# Patient Record
Sex: Male | Born: 1945 | Race: White | Hispanic: No | Marital: Married | Smoking: Never smoker
Health system: Southern US, Community
[De-identification: ages and names within clinical notes are randomized; demographics above are authoritative.]

## PROBLEM LIST (undated history)

## (undated) DIAGNOSIS — L570 Actinic keratosis: Secondary | ICD-10-CM

## (undated) DIAGNOSIS — E782 Mixed hyperlipidemia: Secondary | ICD-10-CM

## (undated) DIAGNOSIS — I209 Angina pectoris, unspecified: Secondary | ICD-10-CM

## (undated) DIAGNOSIS — D62 Acute posthemorrhagic anemia: Secondary | ICD-10-CM

## (undated) DIAGNOSIS — I10 Essential (primary) hypertension: Secondary | ICD-10-CM

## (undated) DIAGNOSIS — R1900 Intra-abdominal and pelvic swelling, mass and lump, unspecified site: Secondary | ICD-10-CM

## (undated) DIAGNOSIS — C480 Malignant neoplasm of retroperitoneum: Secondary | ICD-10-CM

## (undated) DIAGNOSIS — K56609 Unspecified intestinal obstruction, unspecified as to partial versus complete obstruction: Secondary | ICD-10-CM

## (undated) DIAGNOSIS — D126 Benign neoplasm of colon, unspecified: Secondary | ICD-10-CM

## (undated) DIAGNOSIS — R188 Other ascites: Secondary | ICD-10-CM

## (undated) DIAGNOSIS — R7301 Impaired fasting glucose: Secondary | ICD-10-CM

## (undated) HISTORY — PX: OTHER SURGICAL HISTORY: SHX169

---
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
## 1968-11-29 HISTORY — PX: KNEE ARTHROSCOPY: SUR90

## 1999-12-20 ENCOUNTER — Encounter: Payer: Self-pay | Admitting: Family Medicine

## 1999-12-20 ENCOUNTER — Ambulatory Visit (HOSPITAL_COMMUNITY): Admission: RE | Admit: 1999-12-20 | Discharge: 1999-12-20 | Payer: Self-pay | Admitting: Family Medicine

## 2002-04-13 ENCOUNTER — Encounter (INDEPENDENT_AMBULATORY_CARE_PROVIDER_SITE_OTHER): Payer: Self-pay | Admitting: *Deleted

## 2002-04-13 ENCOUNTER — Ambulatory Visit (HOSPITAL_COMMUNITY): Admission: RE | Admit: 2002-04-13 | Discharge: 2002-04-13 | Payer: Self-pay | Admitting: *Deleted

## 2010-03-13 ENCOUNTER — Ambulatory Visit
Admission: RE | Admit: 2010-03-13 | Discharge: 2010-03-13 | Payer: Self-pay | Source: Home / Self Care | Attending: Orthopedic Surgery | Admitting: Orthopedic Surgery

## 2010-03-29 ENCOUNTER — Encounter
Admission: RE | Admit: 2010-03-29 | Discharge: 2010-03-29 | Payer: Self-pay | Source: Home / Self Care | Attending: Family Medicine | Admitting: Family Medicine

## 2010-05-20 NOTE — Op Note (Signed)
  NAME:  Darryl Griffith, Darryl Griffith               ACCOUNT NO.:  [id number]  MEDICAL RECORD NO.:  [id number]          PATIENT TYPE:  AMB  LOCATION:  DSC                          FACILITY:  [facility]  PHYSICIAN:  Cindee Salt, M.D.       DATE OF BIRTH:  1945-04-03  DATE OF PROCEDURE:  03/13/2010 DATE OF DISCHARGE:                              OPERATIVE REPORT   PREOPERATIVE DIAGNOSIS:  Stenosing tenosynovitis, left middle finger.  POSTOPERATIVE DIAGNOSIS:  Stenosing tenosynovitis, left middle finger.  OPERATION:  Release of A1 pulley, left middle finger.  SURGEON:  Cindee Salt, MD  ASSISTANT:  Joaquin Courts, RN  ANESTHESIA:  Forearm-based IV regional with local infiltration.  ANESTHESIOLOGIST:  Janetta Hora. Gelene Mink, MD  HISTORY:  The patient is a 65 year old male with a history of triggering of his left middle finger.  This did not respond to conservative treatment.  He has elected to undergo surgical decompression.  Pre, peri, and postoperative course have been discussed along with risks and complications.  He is aware that there is no guarantee with the surgery, possibility of infection, recurrence, injury to arteries, nerves, tendons, incomplete relief of symptoms, dystrophy.  In the preoperative area, the patient is seen, the extremity marked by both the patient and surgeon, and antibiotic given.  PROCEDURE IN DETAIL:  The patient was brought to the operating room where a forearm-based IV regional anesthetic was carried out without difficulty.  He was prepped using ChloraPrep, supine position, left arm free.  A 3-minute dry time was allowed.  Time-out taken confirming the patient and procedure.  Following adequate anesthesia, an oblique incision was made over the A1 pulley.  He had some feeling of local infiltration with 0.25% Marcaine without epinephrine was given, approximately 3-4 mL was used.  The dissection was carried down to the A1 pulley.  Retractor was placed protecting the  neurovascular structures.  The A1 pulley was found be markedly thickened.  This was incised on its radial aspect, released proximally and distally.  A small incision was made centrally in the A2 pulley.  Full flexion/extension of the finger revealed no further triggering.  The wound was irrigated. Skin closed with interrupted 5-0 Vicryl Rapide sutures.  Sterile compressive dressing with fingers free was applied.  On deflation of the tourniquet, all fingers immediately pinked.  He was taken to the recovery room for observation in satisfactory condition.  He will be discharged home to return to the [facility] [place] in 1 week on Vicodin.          ______________________________ Cindee Salt, M.D.     GK/MEDQ  D:  03/13/2010  T:  03/13/2010  Job:  [id number]  Electronically Signed by Cindee Salt M.D. on 05/20/2010 12:36:05 PM

## 2010-06-11 LAB — POCT I-STAT, CHEM 8
BUN: 16 mg/dL (ref 6–23)
Calcium, Ion: 1.16 mmol/L (ref 1.12–1.32)
Chloride: 106 mEq/L (ref 96–112)
Creatinine, Ser: 1 mg/dL (ref 0.4–1.5)
Glucose, Bld: 105 mg/dL — ABNORMAL HIGH (ref 70–99)
HCT: 46 % (ref 39.0–52.0)
Hemoglobin: 15.6 g/dL (ref 13.0–17.0)
Potassium: 4 mEq/L (ref 3.5–5.1)
Sodium: 142 mEq/L (ref 135–145)
TCO2: 26 mmol/L (ref 0–100)

## 2013-09-30 ENCOUNTER — Encounter (HOSPITAL_COMMUNITY): Payer: Self-pay | Admitting: Pharmacy Technician

## 2013-10-10 ENCOUNTER — Encounter (HOSPITAL_COMMUNITY): Payer: Self-pay | Admitting: *Deleted

## 2013-10-14 ENCOUNTER — Other Ambulatory Visit: Payer: Self-pay | Admitting: Gastroenterology

## 2013-10-17 NOTE — Addendum Note (Signed)
Addended by: Wilford Corner on: 10/17/2013 06:42 AM   Modules accepted: Orders

## 2013-10-20 ENCOUNTER — Ambulatory Visit (HOSPITAL_COMMUNITY)
Admission: RE | Admit: 2013-10-20 | Discharge: 2013-10-20 | Disposition: A | Discharge status: Home / Self Care | Payer: Medicare Other | Source: Ambulatory Visit | Attending: Gastroenterology | Admitting: Gastroenterology

## 2013-10-20 ENCOUNTER — Ambulatory Visit (HOSPITAL_COMMUNITY): Payer: Medicare Other | Admitting: Anesthesiology

## 2013-10-20 ENCOUNTER — Encounter (HOSPITAL_COMMUNITY)
Admission: RE | Disposition: A | Discharge status: Home / Self Care | Payer: Self-pay | Source: Ambulatory Visit | Attending: Gastroenterology

## 2013-10-20 ENCOUNTER — Encounter (HOSPITAL_COMMUNITY): Payer: Medicare Other | Admitting: Anesthesiology

## 2013-10-20 ENCOUNTER — Encounter (HOSPITAL_COMMUNITY): Payer: Self-pay | Admitting: *Deleted

## 2013-10-20 DIAGNOSIS — Z79899 Other long term (current) drug therapy: Secondary | ICD-10-CM | POA: Diagnosis not present

## 2013-10-20 DIAGNOSIS — D126 Benign neoplasm of colon, unspecified: Secondary | ICD-10-CM | POA: Diagnosis present

## 2013-10-20 DIAGNOSIS — I10 Essential (primary) hypertension: Secondary | ICD-10-CM | POA: Diagnosis not present

## 2013-10-20 DIAGNOSIS — K5730 Diverticulosis of large intestine without perforation or abscess without bleeding: Secondary | ICD-10-CM | POA: Insufficient documentation

## 2013-10-20 DIAGNOSIS — K648 Other hemorrhoids: Secondary | ICD-10-CM | POA: Insufficient documentation

## 2013-10-20 HISTORY — DX: Essential (primary) hypertension: I10

## 2013-10-20 HISTORY — PX: HOT HEMOSTASIS: SHX5433

## 2013-10-20 HISTORY — PX: COLONOSCOPY WITH PROPOFOL: SHX5780

## 2013-10-20 SURGERY — COLONOSCOPY WITH PROPOFOL
Anesthesia: Monitor Anesthesia Care

## 2013-10-20 MED ORDER — MIDAZOLAM HCL 2 MG/2ML IJ SOLN
INTRAMUSCULAR | Status: AC
  Filled 2013-10-20: qty 2

## 2013-10-20 MED ORDER — PROPOFOL 10 MG/ML IV BOLUS
INTRAVENOUS | Status: AC
  Filled 2013-10-20: qty 20

## 2013-10-20 MED ORDER — SODIUM CHLORIDE 0.9 % IV SOLN: INTRAVENOUS | Status: DC

## 2013-10-20 MED ORDER — ONDANSETRON HCL 4 MG/2ML IJ SOLN
INTRAMUSCULAR | Status: DC | PRN
  Administered 2013-10-20: 4 mg via INTRAVENOUS

## 2013-10-20 MED ORDER — MIDAZOLAM HCL 5 MG/5ML IJ SOLN
INTRAMUSCULAR | Status: DC | PRN
  Administered 2013-10-20: 2 mg via INTRAVENOUS

## 2013-10-20 MED ORDER — PROPOFOL INFUSION 10 MG/ML OPTIME
INTRAVENOUS | Status: DC | PRN
  Administered 2013-10-20: 120 ug/kg/min via INTRAVENOUS

## 2013-10-20 MED ORDER — LACTATED RINGERS IV SOLN: INTRAVENOUS | Status: DC

## 2013-10-20 MED ORDER — KETAMINE HCL 10 MG/ML IJ SOLN
INTRAMUSCULAR | Status: DC | PRN
  Administered 2013-10-20: 20 mg via INTRAVENOUS

## 2013-10-20 MED ORDER — LACTATED RINGERS IV SOLN
INTRAVENOUS | Status: DC
  Administered 2013-10-20: 1000 mL via INTRAVENOUS

## 2013-10-20 MED ORDER — LIDOCAINE HCL (CARDIAC) 20 MG/ML IV SOLN
INTRAVENOUS | Status: AC
  Filled 2013-10-20: qty 5

## 2013-10-20 MED ORDER — ONDANSETRON HCL 4 MG/2ML IJ SOLN
INTRAMUSCULAR | Status: AC
  Filled 2013-10-20: qty 2

## 2013-10-20 MED ORDER — LIDOCAINE HCL (CARDIAC) 20 MG/ML IV SOLN
INTRAVENOUS | Status: DC | PRN
  Administered 2013-10-20: 100 mg via INTRAVENOUS

## 2013-10-20 SURGICAL SUPPLY — 21 items

## 2013-10-20 NOTE — Anesthesia Preprocedure Evaluation (Signed)
Anesthesia Evaluation  Patient identified by MRN, date of birth, ID band Patient awake    Reviewed: Allergy & Precautions, H&P , NPO status , Patient's Chart, lab work & pertinent test results  Airway Mallampati: II TM Distance: >3 FB Neck ROM: Full    Dental no notable dental hx.    Pulmonary neg pulmonary ROS,  breath sounds clear to auscultation  Pulmonary exam normal       Cardiovascular hypertension, Pt. on medications Rhythm:Regular Rate:Normal     Neuro/Psych negative neurological ROS  negative psych ROS   GI/Hepatic negative GI ROS, Neg liver ROS,   Endo/Other  negative endocrine ROS  Renal/GU negative Renal ROS  negative genitourinary   Musculoskeletal negative musculoskeletal ROS (+)   Abdominal (+) + obese,   Peds negative pediatric ROS (+)  Hematology negative hematology ROS (+)   Anesthesia Other Findings   Reproductive/Obstetrics negative OB ROS                           Anesthesia Physical Anesthesia Plan  ASA: II  Anesthesia Plan: MAC   Post-op Pain Management:    Induction: Intravenous  Airway Management Planned:   Additional Equipment:   Intra-op Plan:   Post-operative Plan:   Informed Consent: I have reviewed the patients History and Physical, chart, labs and discussed the procedure including the risks, benefits and alternatives for the proposed anesthesia with the patient or authorized representative who has indicated his/her understanding and acceptance.   Dental advisory given  Plan Discussed with: CRNA  Anesthesia Plan Comments:         Anesthesia Quick Evaluation

## 2013-10-20 NOTE — Interval H&P Note (Signed)
History and Physical Interval Note:  10/20/2013 9:56 AM  Darryl Griffith  has presented today for surgery, with the diagnosis of polyps  The various methods of treatment have been discussed with the patient and family. After consideration of risks, benefits and other options for treatment, the patient has consented to  Procedure(s): COLONOSCOPY WITH PROPOFOL (N/A) HOT HEMOSTASIS (ARGON PLASMA COAGULATION/BICAP) (N/A) as a surgical intervention .  The patient's history has been reviewed, patient examined, no change in status, stable for surgery.  I have reviewed the patient's chart and labs.  Questions were answered to the patient's satisfaction.     Bushnell C.

## 2013-10-20 NOTE — Transfer of Care (Signed)
Immediate Anesthesia Transfer of Care Note  Patient: Darryl Griffith  Procedure(s) Performed: Procedure(s) (LRB): COLONOSCOPY WITH PROPOFOL (N/A) HOT HEMOSTASIS (ARGON PLASMA COAGULATION/BICAP) (N/A)  Patient Location: PACU  Anesthesia Type: MAC  Level of Consciousness: sedated, patient cooperative and responds to stimulation  Airway & Oxygen Therapy: Patient Spontanous Breathing and Patient connected to face mask oxgen  Post-op Assessment: Report given to PACU RN and Post -op Vital signs reviewed and stable  Post vital signs: Reviewed and stable  Complications: No apparent anesthesia complications

## 2013-10-20 NOTE — H&P (Signed)
  Date of Initial H&P: 10/14/13  History reviewed, patient examined, no change in status, stable for surgery.

## 2013-10-20 NOTE — Op Note (Signed)
[facility] [place], [address]   COLONOSCOPY PROCEDURE REPORT  PATIENT: Darryl Griffith, Darryl Griffith  MR#: [id number] BIRTHDATE: 1945/08/27 , 33  yrs. old GENDER: Male ENDOSCOPIST: Wilford Corner, MD REFERRED TY:OMAYO Little, M.D. PROCEDURE DATE:  10/20/2013 PROCEDURE:   Colonoscopy with snare polypectomy ASA CLASS:   Class II INDICATIONS:history of adenomatous polyps MEDICATIONS: See Anesthesia Report.  DESCRIPTION OF PROCEDURE:   After the risks benefits and alternatives of the procedure were thoroughly explained, informed consent was obtained.  The Pentax Ped Colon [id number]  endoscope was introduced through the anus and advanced to the cecum, which was identified by both the appendix and ileocecal valve , limited by No adverse events experienced.   The quality of the prep was good. . The instrument was then slowly withdrawn as the colon was fully examined.     FINDINGS:  Rectal exam unremarkable.  Pediatric colonoscope inserted into the colon and advanced to the cecum, where the appendiceal orifice and ileocecal valve were identified.   In the cecum an 8 mm semi-sessile polyp was seen that was removed with snare cautery. A flat portion that remained was fulgurated with argon plasma coagulation (APC). Few sigmoid diverticuli were noted. Retroflexion revealed small internal hemorrhoids.  COMPLICATIONS: None  IMPRESSION:     Cecal polyp - s/p snare cautery and APC (see above) Sigmoid diverticulosis Internal hemorrhoids  RECOMMENDATIONS: F/U on path; No aspirin products for 2 weeks    ______________________________ eSigned:  Wilford Corner, MD 10/20/2013 10:57 AM   KH:TXHFS Rex Kras, MD

## 2013-10-20 NOTE — Discharge Instructions (Signed)
No aspirin or ibuprofen products for 2 weeks. Will call when pathology results are complete. Colonoscopy A colonoscopy is an exam to look at your colon. This exam can help find lumps (tumors), growths (polyps), bleeding, and redness and puffiness (inflammation) in your colon.  BEFORE THE PROCEDURE  Ask your doctor about changing or stopping your regular medicines.  You may need to drink a large amount of a special liquid (oral bowel prep). You start drinking this the day before your procedure. It will cause you to have watery poop (stool). This cleans out your colon.  Do not eat or drink anything else once you have started the bowel prep, unless your doctor tells you it is safe to do so.  Make plans for someone to drive you home after the procedure. PROCEDURE  You will be given medicine to help you relax (sedative).  You will lie on your side with your knees bent.  A tube with a camera on the end is put in the opening of your butt (anus) and into your colon. Pictures are sent to a computer screen. Your doctor will look for anything that is not normal.  Your doctor may take a tissue sample (biopsy) from your colon to be looked at more closely.  The exam is finished when your doctor has viewed all of the colon. AFTER THE PROCEDURE  Do not drive for 24 hours after the exam.  You may have a small amount of blood in your poop. This is normal.  You may pass gas and have belly (abdominal) cramps. This is normal.  Ask when your test results will be ready. Make sure you get your test results. Document Released: 04/19/2010 Document Revised: 03/22/2013 Document Reviewed: 11/22/2012 [facility] Patient Information 2015 [place], [place]. This information is not intended to replace advice given to you by your health care provider. Make sure you discuss any questions you have with your health care provider.

## 2013-10-20 NOTE — Anesthesia Postprocedure Evaluation (Signed)
  Anesthesia Post-op Note  Patient: Darryl Griffith  Procedure(s) Performed: Procedure(s) (LRB): COLONOSCOPY WITH PROPOFOL (N/A) HOT HEMOSTASIS (ARGON PLASMA COAGULATION/BICAP) (N/A)  Patient Location: PACU  Anesthesia Type: MAC  Level of Consciousness: awake and alert   Airway and Oxygen Therapy: Patient Spontanous Breathing  Post-op Pain: mild  Post-op Assessment: Post-op Vital signs reviewed, Patient's Cardiovascular Status Stable, Respiratory Function Stable, Patent Airway and No signs of Nausea or vomiting  Last Vitals:  Filed Vitals:   10/20/13 1100  BP: 136/93  Pulse: 61  Temp:   Resp: 18    Post-op Vital Signs: stable   Complications: No apparent anesthesia complications

## 2013-10-21 ENCOUNTER — Encounter (HOSPITAL_COMMUNITY): Payer: Self-pay | Admitting: Gastroenterology

## 2014-02-03 ENCOUNTER — Ambulatory Visit
Admission: RE | Admit: 2014-02-03 | Discharge: 2014-02-03 | Disposition: A | Discharge status: Home / Self Care | Payer: Medicare Other | Source: Ambulatory Visit | Attending: Orthopedic Surgery | Admitting: Orthopedic Surgery

## 2014-02-03 ENCOUNTER — Other Ambulatory Visit: Payer: Self-pay | Admitting: Orthopedic Surgery

## 2014-02-03 DIAGNOSIS — R0789 Other chest pain: Secondary | ICD-10-CM

## 2014-04-13 ENCOUNTER — Other Ambulatory Visit: Payer: Self-pay | Admitting: Family Medicine

## 2014-04-13 DIAGNOSIS — R0781 Pleurodynia: Secondary | ICD-10-CM

## 2014-04-17 ENCOUNTER — Other Ambulatory Visit: Payer: Self-pay

## 2014-04-19 ENCOUNTER — Ambulatory Visit
Admission: RE | Admit: 2014-04-19 | Discharge: 2014-04-19 | Disposition: A | Discharge status: Home / Self Care | Payer: Medicare Other | Source: Ambulatory Visit | Attending: Family Medicine | Admitting: Family Medicine

## 2014-04-19 DIAGNOSIS — R0781 Pleurodynia: Secondary | ICD-10-CM

## 2014-04-19 MED ORDER — IOHEXOL 300 MG/ML  SOLN
75.0000 mL | Freq: Once | INTRAMUSCULAR | Status: AC | PRN
  Administered 2014-04-19: 75 mL via INTRAVENOUS

## 2014-04-27 ENCOUNTER — Other Ambulatory Visit: Payer: Self-pay | Admitting: Family Medicine

## 2014-04-27 DIAGNOSIS — R1900 Intra-abdominal and pelvic swelling, mass and lump, unspecified site: Secondary | ICD-10-CM

## 2014-04-27 DIAGNOSIS — R9389 Abnormal findings on diagnostic imaging of other specified body structures: Secondary | ICD-10-CM

## 2014-05-01 ENCOUNTER — Ambulatory Visit
Admission: RE | Admit: 2014-05-01 | Discharge: 2014-05-01 | Disposition: A | Discharge status: Home / Self Care | Payer: Medicare Other | Source: Ambulatory Visit | Attending: Family Medicine | Admitting: Family Medicine

## 2014-05-01 DIAGNOSIS — R9389 Abnormal findings on diagnostic imaging of other specified body structures: Secondary | ICD-10-CM

## 2014-05-01 DIAGNOSIS — R1900 Intra-abdominal and pelvic swelling, mass and lump, unspecified site: Secondary | ICD-10-CM

## 2014-05-01 MED ORDER — IOHEXOL 300 MG/ML  SOLN
125.0000 mL | Freq: Once | INTRAMUSCULAR | Status: AC | PRN
  Administered 2014-05-01: 125 mL via INTRAVENOUS

## 2014-05-03 ENCOUNTER — Ambulatory Visit
Admission: RE | Admit: 2014-05-03 | Discharge: 2014-05-03 | Disposition: A | Discharge status: Home / Self Care | Payer: Medicare Other | Source: Ambulatory Visit | Attending: Family Medicine | Admitting: Family Medicine

## 2014-05-03 ENCOUNTER — Other Ambulatory Visit: Payer: Self-pay | Admitting: Family Medicine

## 2014-05-03 ENCOUNTER — Telehealth: Payer: Self-pay | Admitting: Oncology

## 2014-05-03 DIAGNOSIS — N5089 Other specified disorders of the male genital organs: Secondary | ICD-10-CM

## 2014-05-03 NOTE — Telephone Encounter (Signed)
pt confirmed appt on 05/11/14 at 10:30 with Shadad DX:  lymphadenopathy testicular mass Referring Dr. Rex Kras

## 2014-05-05 ENCOUNTER — Telehealth: Payer: Self-pay | Admitting: Oncology

## 2014-05-05 NOTE — Telephone Encounter (Signed)
Chart delivered °

## 2014-05-11 ENCOUNTER — Telehealth: Payer: Self-pay | Admitting: *Deleted

## 2014-05-11 ENCOUNTER — Encounter (INDEPENDENT_AMBULATORY_CARE_PROVIDER_SITE_OTHER): Payer: Self-pay

## 2014-05-11 ENCOUNTER — Encounter: Payer: Self-pay | Admitting: Oncology

## 2014-05-11 ENCOUNTER — Other Ambulatory Visit: Payer: Medicare Other

## 2014-05-11 ENCOUNTER — Ambulatory Visit (HOSPITAL_BASED_OUTPATIENT_CLINIC_OR_DEPARTMENT_OTHER): Payer: Medicare Other | Admitting: Oncology

## 2014-05-11 ENCOUNTER — Telehealth: Payer: Self-pay | Admitting: Oncology

## 2014-05-11 ENCOUNTER — Ambulatory Visit: Payer: Medicare Other

## 2014-05-11 ENCOUNTER — Telehealth: Payer: Self-pay

## 2014-05-11 VITALS — BP 137/67 | HR 73 | Temp 97.9°F | Resp 20 | Ht 68.0 in | Wt 217.9 lb

## 2014-05-11 DIAGNOSIS — R599 Enlarged lymph nodes, unspecified: Secondary | ICD-10-CM

## 2014-05-11 DIAGNOSIS — C8590 Non-Hodgkin lymphoma, unspecified, unspecified site: Secondary | ICD-10-CM

## 2014-05-11 DIAGNOSIS — R1909 Other intra-abdominal and pelvic swelling, mass and lump: Secondary | ICD-10-CM

## 2014-05-11 NOTE — Progress Notes (Signed)
Please see consult note.  

## 2014-05-11 NOTE — Telephone Encounter (Signed)
S/w pt, when he had gotten home he heard a message from [place] and deleted without listening to whole thing. He called to make sure he did not miss anything. No notes found for new appts. Pt aware of Dr Brantley Stage appt.

## 2014-05-11 NOTE — Telephone Encounter (Signed)
PT. HAS TWO QUESTIONS CONCERNING TODAY'S VISIT. 1) HOW MANY LYMPH NODES ARE AFFECTED? 2) IS THIS CANCER HEREDITARY? THIS MESSAGE WAS SENT TO DR.SHADAD AND TO [place].

## 2014-05-11 NOTE — Consult Note (Signed)
Reason for Referral: Abdominal mass.   HPI: 69 year old gentleman native of [place] currently resides in this area for the last 20 years. He is currently retired and rather healthy gentleman with history of hypertension and hyperlipidemia. He was in his usual state of health until September 2015 where he started developing nagging abdominal pain. His pain is predominately at nighttime on the left side of his rib cage. He was treated with anti-inflammatories without any significant improvement. In January 2016 he continued to complain of these pains during his routine physical examination and a CT scan of the abdomen and pelvis obtained on 05/01/2014 showed left-sided retroperitoneal adenopathy adjacent to the adrenal gland measures 4.0 x 3.2 cm. There also a normal conglomerate surrounding surrounding the left renal artery measuring 9.5 x 5.5 cm. There is also a concurrent left scrotal neovascularity there was suspicious for a central mass. A ultrasound of the testicle obtained on 05/03/2014 showed no intratesticular or extratesticular mass and likely a prominent vascular structure extending into the left scrotum was noted. Some baseline findings was asked to comment about these abnormalities. Clinically, other than symptoms of left-sided rib cage abdominal pain he has no other complaints. He does not report any fevers, chills, sweats, weight loss. Has not reported any other constitutional symptoms. He is not report any headaches, blurry vision, syncope or seizures. He does not report any chest pain, palpitation, orthopnea or leg edema. He does not report any cough, hemoptysis, or wheezing. He does not report any nausea, vomiting, constipation, diarrhea, hematochezia, melena or change in his bowel habits. He does not report any frequency, urgency, hesitancy or hematuria. He does not report any other lymphadenopathy. He does not report any skeletal complaints. Rest of his review of systems unremarkable.   Past  Medical History  Diagnosis Date  . Hypertension   . Elevated cholesterol   :  Past Surgical History  Procedure Laterality Date  . Surgery for arm fracture Right 1952  . Knee arthroscopy Right 1970's  . Colonoscopy with propofol N/A 10/20/2013    Procedure: COLONOSCOPY WITH PROPOFOL;  Surgeon: Lear Ng, MD;  Location: [facility] ENDOSCOPY;  Service: Endoscopy;  Laterality: N/A;  . Hot hemostasis N/A 10/20/2013    Procedure: HOT HEMOSTASIS (ARGON PLASMA COAGULATION/BICAP);  Surgeon: Lear Ng, MD;  Location: [facility];  Service: Endoscopy;  Laterality: N/A;  :   Current outpatient prescriptions:  .  aspirin 81 MG tablet, Take 81 mg by mouth daily., Disp: , Rfl:  .  losartan-hydrochlorothiazide (HYZAAR) 100-25 MG per tablet, Take 1 tablet by mouth every morning., Disp: , Rfl:  .  simvastatin (ZOCOR) 40 MG tablet, Take 40 mg by mouth every evening., Disp: , Rfl:  .  traZODone (DESYREL) 50 MG tablet, Take 50 mg by mouth at bedtime as needed for sleep., Disp: , Rfl: :  No Known Allergies:  No family history on file.:  History   Social History  . Marital Status: Married    Spouse Name: N/A  . Number of Children: N/A  . Years of Education: N/A   Occupational History  . Not on file.   Social History Main Topics  . Smoking status: Never Smoker   . Smokeless tobacco: Never Used  . Alcohol Use: Yes     Comment: 2-3 beers per night  . Drug Use: No  . Sexual Activity: Not on file   Other Topics Concern  . Not on file   Social History Narrative  . No narrative on file  :  Pertinent items are noted in HPI.  Exam: Blood pressure 137/67, pulse 73, temperature 97.9 F (36.6 C), temperature source Oral, resp. rate 20, height 5\' 8"  (1.727 m), weight 217 lb 14.4 oz (98.839 kg). General appearance: alert and cooperative Head: Normocephalic, without obvious abnormality Nose: Nares normal. Septum midline. Mucosa normal. No drainage or sinus tenderness. Throat: lips,  mucosa, and tongue normal; teeth and gums normal Neck: no adenopathy Back: negative Resp: clear to auscultation bilaterally Cardio: regular rate and rhythm, S1, S2 normal, no murmur, click, rub or gallop GI: soft, non-tender; bowel sounds normal; no masses,  no organomegaly Extremities: extremities normal, atraumatic, no cyanosis or edema Pulses: 2+ and symmetric Skin: Skin color, texture, turgor normal. No rashes or lesions Lymph nodes: Cervical, supraclavicular, and axillary nodes normal.  CBC    Component Value Date/Time   HGB 15.6 03/13/2010 0739   HCT 46.0 03/13/2010 0739     Ct Chest W Contrast  04/19/2014   CLINICAL DATA:  Persisting posterior lateral left rib pain. No known injury. Pain since September of 2015.  EXAM: CT CHEST WITH CONTRAST  TECHNIQUE: Multidetector CT imaging of the chest was performed during intravenous contrast administration.  CONTRAST:  62mL OMNIPAQUE IOHEXOL 300 MG/ML  SOLN  COMPARISON:  Plain films 02/03/2014  FINDINGS: No acute bony abnormality.  No focal rib lesion or rib fracture.  Heart is normal size. Aorta is normal caliber. Retroesophageal right subclavian artery noted. No mediastinal, hilar, or axillary adenopathy. Chest wall soft tissues are unremarkable. Lungs are clear. No focal airspace opacities or suspicious nodules. No effusions.  Imaging into the upper abdomen demonstrates partial imaging of a left retroperitoneal/periaortic mass, presumably bulky retroperitoneal adenopathy. This measures 8.5 x 5.1 cm on the final image and is incompletely imaged.  IMPRESSION: Large left retroperitoneal mass, presumably bulky lymphadenopathy, only partially imaged on this chest CT. Recommend dedicated CT abdomen and pelvis with oral and IV contrast for complete evaluation. Findings concerning for metastatic adenopathy or lymphoma.  No acute findings in the chest.   Electronically Signed   By: Rolm Baptise M.D.   On: 04/19/2014 09:38   US Scrotum  05/03/2014    CLINICAL DATA:  Increased vascularity along the left spermatic cord occurring left scrotum on recent CT. Concern for potential testicular mass  EXAM: ULTRASOUND OF SCROTUM  TECHNIQUE: Complete ultrasound examination of the testicles, epididymis, and other scrotal structures was performed.  COMPARISON:  CT abdomen and pelvis May 01, 2014  FINDINGS: Right testicle  Measurements: 5.0 x 2.6 x 3.7 cm. No mass or microlithiasis visualized. Color flow is seen in the right testis.  Left testicle  Measurements: 4.9 x 2.5 x 3.1 cm. No mass or microlithiasis visualized. Color flow is seen in the left testis.  Right epididymis:  Normal in size and appearance.  Left epididymis:  Normal in size and appearance.  Hydrocele: There are small hydroceles bilaterally, slightly larger on the right than on the left.  Varicocele: There is a small varicocele on the left with Valsalva maneuver.  There are prominent vascular structures surrounding the spermatic cord extending into the left scrotum as noted on recent CT. There is no demonstrable scrotal wall thickening or scrotal abscess on either side.  IMPRESSION: No intratesticular or extratesticular mass on either side. The prominence of vascular structures extending into the left scrotum tracking along the spermatic cord are again noted. Suspect that this finding is due to venous insufficiency from the more superior adenopathy as opposed to a lesion arising  from the scrotum itself. There are small hydroceles bilaterally, slightly larger on the right than on the left. There is a small varicocele on the left.   Electronically Signed   By: Lowella Grip M.D.   On: 05/03/2014 12:32   Ct Abdomen Pelvis W Contrast  05/01/2014   CLINICAL DATA:  Abdominal mass on chest CT. Painful left posterior lateral rib. Hypertension and elevated cholesterol.  EXAM: CT ABDOMEN AND PELVIS WITH CONTRAST  TECHNIQUE: Multidetector CT imaging of the abdomen and pelvis was performed using the standard  protocol following bolus administration of intravenous contrast.  CONTRAST:  166mL OMNIPAQUE IOHEXOL 300 MG/ML  SOLN  COMPARISON:  Chest CT of 04/19/2014.  FINDINGS: Lower chest: Clear lung bases. Normal heart size without pericardial or pleural effusion. Radiographic marker identified on image 27 of series 2, projecting superficial to the interspace between the ninth and tenth posterior lateral left ribs. No underlying mass or rib fracture identified.  Hepatobiliary: Normal liver. Normal gallbladder, without biliary ductal dilatation.  Pancreas: Normal, without mass or ductal dilatation.  Spleen: Normal  Adrenals/Urinary Tract: Normal adrenal glands. Suspicion of a central interpolar right renal 1.2 cm cyst. Normal left kidney. No hydronephrosis. Normal urinary bladder.  Stomach/Bowel: Extensive colonic diverticulosis with muscular hypertrophy involving the sigmoid. Normal terminal ileum. Normal small bowel.  Vascular/Lymphatic: Aortic and branch vessel atherosclerosis, including at the origin left renal artery (which remains patent).  Left retroperitoneal adenopathy. A node immediately adjacent the left adrenal gland measures 4.0 x 3.2 cm on image 27. Nodal conglomerate surrounding the left renal artery measures 9.5 x 5.5 cm on image 34.  Reproductive: Normal prostate. Increased vascularity along the left spermatic cord and entering the left hemiscrotum. Right hydrocele.  Other: No significant free fluid.  Musculoskeletal: Lumbosacral degenerative disc disease and spondylosis.  IMPRESSION: 1. Bulky retroperitoneal adenopathy. 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 Concurrent left scrotal neovascularity could be related to a testicular primary or venous insufficiency due to central mass. Lymphoproliferative process such as lymphoma is felt less likely. Testicular ultrasound should be considered. 2. Extensive atherosclerosis.   Electronically Signed   By: Abigail Miyamoto M.D.   On: 05/01/2014  16:56    Assessment and Plan:    69 year old gentleman with the following issues:  1. Retroperitoneal adenopathy with bulky abdominal masses measuring 9.5 x 5.5 cm surrounding the left renal artery as well as a 4.0 x 3.2 cm adjacent to the left adrenal gland. The differential diagnosis was discussed with the patient today. Metastatic solid tumor malignancy to a lymph gland is a consideration but less likely. Benign lymph gland enlargement will also be unlikely. Most likely we are dealing with a lymphoproliferative disorder in the form of lymphoma. Different causes of lymphoma can present this way but fortunately he does not have any constitutional symptoms or widespread lymphadenopathy based on his imaging studies that were reviewed today. His laboratory testing in January 2016 were also reviewed and showed no clear-cut abnormalities.  To work this up at this time I will obtain a staging PET CT scan to identify other areas of lymphadenopathy. The next step would be to obtain an excisional biopsy. It is very critical to have adequate sample for determining the diagnosis also to determine the type of lymphoma. If his PET scan did not show any palpable or superficial lymphadenopathy in the cervical, axillary or inguinal area I would favor in laparoscopic excisional biopsy of his abdominal adenopathy. For that reason I will make a referral to  Gen. surgery for an evaluation regarding this.  Once the pathology is available I will discuss with him the treatment option. That would include chemotherapy versus observation and surveillance depending on what type of malignancy we are dealing with. The odds are if this is lymphoma he will certainly require chemotherapy.  2. Venous congestion in the testicular area: I doubt this is testicular cancer as evidenced by his normal ultrasound.  All his questions were answered today and we will set up a follow-up upon completing the biopsy procedure.

## 2014-05-11 NOTE — Telephone Encounter (Signed)
gv and printed appt sched adn avs fo rpt for Feb...Marland Kitchenpt sched to see Dr. Brantley Stage on 2.26 @ 10:30am....per Dr. Alen Blew no not sched f/u he will determine f.u once biopsy and PET are done.

## 2014-05-11 NOTE — Progress Notes (Signed)
Checked in new pt with no financial concerns prior to seeing the dr.  Informed pt if chemo is part of his treatment I will contact his ins to see if Josem Kaufmann is req and will obtain it if it is as well as contact foundations that offer copay assistance for chemo if needed.  He has my card for any billing questions or concerns.

## 2014-05-12 NOTE — Telephone Encounter (Signed)
Note to dr Hazeline Junker desk

## 2014-05-12 NOTE — Telephone Encounter (Signed)
Per dr Alen Blew, called Darryl Griffith, answered his questions re: lymph node involvement and pathology. Darryl Griffith will keep next appt with dr Alen Blew to discuss   pathology when report is available

## 2014-05-19 ENCOUNTER — Other Ambulatory Visit: Payer: Self-pay | Admitting: *Deleted

## 2014-05-19 ENCOUNTER — Ambulatory Visit (HOSPITAL_COMMUNITY)
Admission: RE | Admit: 2014-05-19 | Discharge: 2014-05-19 | Disposition: A | Discharge status: Home / Self Care | Payer: Medicare Other | Source: Ambulatory Visit | Attending: Oncology | Admitting: Oncology

## 2014-05-19 ENCOUNTER — Telehealth: Payer: Self-pay | Admitting: *Deleted

## 2014-05-19 DIAGNOSIS — R591 Generalized enlarged lymph nodes: Secondary | ICD-10-CM | POA: Diagnosis present

## 2014-05-19 DIAGNOSIS — D126 Benign neoplasm of colon, unspecified: Secondary | ICD-10-CM

## 2014-05-19 DIAGNOSIS — C8590 Non-Hodgkin lymphoma, unspecified, unspecified site: Secondary | ICD-10-CM | POA: Diagnosis not present

## 2014-05-19 DIAGNOSIS — R599 Enlarged lymph nodes, unspecified: Secondary | ICD-10-CM

## 2014-05-19 LAB — GLUCOSE, CAPILLARY: Glucose-Capillary: 81 mg/dL (ref 70–99)

## 2014-05-19 MED ORDER — FLUDEOXYGLUCOSE F - 18 (FDG) INJECTION
11.6000 | Freq: Once | INTRAVENOUS | Status: AC | PRN
  Administered 2014-05-19: 11.6 via INTRAVENOUS

## 2014-05-19 MED ORDER — LORAZEPAM 1 MG PO TABS: 1.0000 mg | ORAL_TABLET | Freq: Three times a day (TID) | ORAL | Status: DC | PRN

## 2014-05-19 NOTE — Telephone Encounter (Signed)
PT. IS ANXIOUS AND HAVING PAIN ON HIS LEFT SIDE. HE FEELS WILL NEED SOMETHING TO HELP HIM RELAX AND A STRONG PAIN MEDICATION SO HE CAN BE STILL FOR THE PET. PLEASE CALL PT. WHEN PRESCRIPTIONS ARE READY.

## 2014-05-19 NOTE — Telephone Encounter (Signed)
This RN spoke with patient and informed him that I phoned in a prescription for Ativan per Dr. Alen Blew. Please take one before the PET scan today. Wife is driving today. Patient verbalized understanding.

## 2014-05-19 NOTE — Telephone Encounter (Signed)
Patient called from pharmacy requesting pain medicine.  "I don't think I can do this PET scan because I have to lie flat and have pain.  For the past two to three months I've slept in a chair because I can't tolerate lying down for more than ten minutes.  The mass lies on a nerve perhaps.  My left chest at rib cage hurts and it moves to my back.  It's a dull ache that doesn't go away.  My pharmacist says I need a narcotic not a muscle relaxant."  Will notify Dr. Alen Blew.  Verbal order received from Dr. Alen Blew for NO narcotics, take ativan before PET scan.  Called Darryl Griffith with these orders.  Instructed to try warm compresses to area before the PET.  To take ativan at 11:00 am and repeat before exam if needed.  Also to have a driver with use of ativan.  "The heating pad doesn't work for me."  Encouraged to close eyes, pray and/or hum a tune to relax through the exam as this test will give more insight to cause of pain.

## 2014-05-22 ENCOUNTER — Telehealth: Payer: Self-pay | Admitting: *Deleted

## 2014-05-22 NOTE — Telephone Encounter (Signed)
Patient called reporting he was told "the PET results were ready Friday.  Would like to know if these are ready and ensure surgeon, Dr. Brantley Stage has results." Will notify Dr. Alen Blew of patient request for results.  Added Dr. Brantley Stage to Care Team list and routed results.

## 2014-05-25 ENCOUNTER — Telehealth: Payer: Self-pay | Admitting: *Deleted

## 2014-05-25 ENCOUNTER — Ambulatory Visit: Payer: Medicare Other | Admitting: Oncology

## 2014-05-25 NOTE — Telephone Encounter (Signed)
Spoke with patient, he is interested in what the PET scan showed, left copy on dr [employer] desk. Patient will call for an appt after his biopsy by dr Brantley Stage. Consult is 05/26/14

## 2014-05-25 NOTE — Telephone Encounter (Signed)
Darryl Griffith called to request results of his PET scan.  He said he needs a FU appointment with Dr. Alen Blew, also.

## 2014-05-26 ENCOUNTER — Other Ambulatory Visit (INDEPENDENT_AMBULATORY_CARE_PROVIDER_SITE_OTHER): Payer: Self-pay | Admitting: *Deleted

## 2014-05-26 ENCOUNTER — Telehealth: Payer: Self-pay | Admitting: *Deleted

## 2014-05-26 ENCOUNTER — Other Ambulatory Visit (INDEPENDENT_AMBULATORY_CARE_PROVIDER_SITE_OTHER): Payer: Self-pay | Admitting: Surgery

## 2014-05-26 DIAGNOSIS — R1900 Intra-abdominal and pelvic swelling, mass and lump, unspecified site: Secondary | ICD-10-CM

## 2014-05-26 NOTE — Telephone Encounter (Signed)
-----   Message from Wyatt Portela, MD sent at 05/25/2014 11:39 AM EST ----- PET scan did not show anything more than the CT scan did. He has a mass that needs a biopsy and that is why he was referred to see Dr. Brantley Stage.  I need to see him AFTER he he gets a biopsy.  Has he seen Dr. Brantley Stage yet?  Is he scheduled for a biopsy yet?  If not, when is he scheduled.?

## 2014-05-26 NOTE — Telephone Encounter (Signed)
Called patient, he stated that dr Alen Blew has already called  Him and answered all of his questions.

## 2014-05-29 ENCOUNTER — Other Ambulatory Visit: Payer: Self-pay | Admitting: Radiology

## 2014-05-30 ENCOUNTER — Other Ambulatory Visit: Payer: Self-pay | Admitting: Radiology

## 2014-05-31 ENCOUNTER — Encounter (HOSPITAL_COMMUNITY): Payer: Self-pay

## 2014-05-31 ENCOUNTER — Ambulatory Visit (HOSPITAL_COMMUNITY)
Admission: RE | Admit: 2014-05-31 | Discharge: 2014-05-31 | Disposition: A | Discharge status: Home / Self Care | Payer: Medicare Other | Source: Ambulatory Visit | Attending: Surgery | Admitting: Surgery

## 2014-05-31 VITALS — BP 113/69 | HR 67 | Temp 97.7°F | Resp 16 | Ht 68.0 in | Wt 212.0 lb

## 2014-05-31 DIAGNOSIS — C480 Malignant neoplasm of retroperitoneum: Secondary | ICD-10-CM | POA: Insufficient documentation

## 2014-05-31 DIAGNOSIS — R1900 Intra-abdominal and pelvic swelling, mass and lump, unspecified site: Secondary | ICD-10-CM | POA: Insufficient documentation

## 2014-05-31 LAB — CBC
HCT: 39.7 % (ref 39.0–52.0)
Hemoglobin: 13.8 g/dL (ref 13.0–17.0)
MCH: 31.4 pg (ref 26.0–34.0)
MCHC: 34.8 g/dL (ref 30.0–36.0)
MCV: 90.4 fL (ref 78.0–100.0)
PLATELETS: 270 10*3/uL (ref 150–400)
RBC: 4.39 MIL/uL (ref 4.22–5.81)
RDW: 13.2 % (ref 11.5–15.5)
WBC: 5.4 10*3/uL (ref 4.0–10.5)

## 2014-05-31 LAB — APTT: aPTT: 32 seconds (ref 24–37)

## 2014-05-31 LAB — PROTIME-INR
INR: 1.1 (ref 0.00–1.49)
PROTHROMBIN TIME: 14.3 s (ref 11.6–15.2)

## 2014-05-31 MED ORDER — MIDAZOLAM HCL 2 MG/2ML IJ SOLN
INTRAMUSCULAR | Status: AC | PRN
  Administered 2014-05-31 (×2): 1 mg via INTRAVENOUS

## 2014-05-31 MED ORDER — FENTANYL CITRATE 0.05 MG/ML IJ SOLN
INTRAMUSCULAR | Status: AC
  Filled 2014-05-31: qty 2

## 2014-05-31 MED ORDER — LIDOCAINE HCL 1 % IJ SOLN
INTRAMUSCULAR | Status: AC
  Filled 2014-05-31: qty 20

## 2014-05-31 MED ORDER — SODIUM CHLORIDE 0.9 % IV SOLN
Freq: Once | INTRAVENOUS | Status: AC
  Administered 2014-05-31: 10:00:00 via INTRAVENOUS

## 2014-05-31 MED ORDER — MIDAZOLAM HCL 2 MG/2ML IJ SOLN
INTRAMUSCULAR | Status: AC
  Filled 2014-05-31: qty 2

## 2014-05-31 MED ORDER — FENTANYL CITRATE 0.05 MG/ML IJ SOLN
INTRAMUSCULAR | Status: AC | PRN
  Administered 2014-05-31 (×2): 50 ug via INTRAVENOUS

## 2014-05-31 NOTE — Discharge Instructions (Signed)
Needle Biopsy Care After These instructions give you information on caring for yourself after your procedure. Your doctor may also give you more specific instructions. Call your doctor if you have any problems or questions after your procedure. HOME CARE  Rest for 4 hours after your biopsy, except for getting up to go to the bathroom or as told.  Keep the places where the needles were put in clean and dry.  Do not put powder or lotion on the sites.  Do not shower until 24 hours after the test. Remove all bandages (dressings) before showering.  Remove all bandages at least once every day. Gently clean the sites with soap and water. Keep putting a new bandage on until the skin is closed. Finding out the results of your test Ask your doctor when your test results will be ready. Make sure you follow up and get the test results. GET HELP RIGHT AWAY IF:   You have shortness of breath or trouble breathing.  You have pain or cramping in your belly (abdomen).  You feel sick to your stomach (nauseous) or throw up (vomit).  Any of the places where the needles were put in:  Are puffy (swollen) or red.  Are sore or hot to the touch.  Are draining yellowish-white fluid (pus).  Are bleeding after 10 minutes of pressing down on the site. Have someone keep pressing on any place that is bleeding until you see a doctor.  You have any unusual pain that will not stop.  You have a fever. If you go to the emergency room, tell the nurse that you had a biopsy. Take this paper with you to show the nurse. MAKE SURE YOU:   Understand these instructions.  Will watch your condition.  Will get help right away if you are not doing well or get worse. Document Released: 02/28/2008 Document Revised: 06/09/2011 Document Reviewed: 02/28/2008 [facility] Patient Information 2015 [place], [place]. This information is not intended to replace advice given to you by your health care provider. Make sure you discuss  any questions you have with your health care provider. Biopsia por puncin - Cuidados posteriores (Needle Biopsy, Care After)  Estas indicaciones le proporcionan informacin general acerca de cmo deber cuidarse despus del procedimiento. El mdico tambin podr darle instrucciones especficas. Comunquese con el mdico si tiene algn problema o tiene preguntas despus del procedimiento.  INSTRUCCIONES PARA EL CUIDADO DOMICILIARIO  Haga reposo durante 4 horas luego de la biopsia, excepto si tiene que levantarse para ir al bao, o segn se le haya indicado.  Mantenga limpio y Higher education careers adviser por donde se introdujeron las agujas.  No se coloque polvos ni lociones.  No se duche hasta que hayan pasado las 24 horas del examen. Qutese todos las compresas (vendajes) antes de ducharse.  Qutese los vendajes al menos una vez al da. Higienice suavemente la zona con agua y [place]. Coloque un vendaje hasta que la herida est cerrada. Averigue los [employer] de su anlisis Consulte con su mdico la fecha en que los resultados estarn disponibles. Concurra a los controles y a Chief Strategy Officer [employer]. SOLICITE AYUDA DE INMEDIATO SI:  Le falta el aire o tiene dificultad para respirar.  Siente dolor o clicos en el vientre (abdomen).  Tiene malestar estomacal (nuseas) o vmitos.  Algunos de los lugares de insercin de las agujas:  Estn hinchados o rojos.  Le duelen o estn calientes al tacto.  Observa una secrecin blanco amarillenta (pus).  Tiene hemorragia  luego de presionar durante 10 minutos la zona. Solicite a Animal nutritionist presione el sitio de la hemorragia hasta que lo vea el mdico.  Siente un dolor diferente que no se detiene.  Tiene fiebre. Si concurre al servicio de urgencias, dgale al [employer] le han practicado una biopsia. Lleve este folleto para mostrarlo al enfermero.  ASEGRESE DE QUE:   Comprende estas instrucciones.  Controlar su  enfermedad.  Solicitar ayuda de inmediato si no mejora o si empeora. Document Released: 06/13/2008 Document Revised: 06/09/2011 [facility] Patient Information 2015 [place]. This information is not intended to replace advice given to you by your health care provider. Make sure you discuss any questions you have with your health care provider.

## 2014-05-31 NOTE — Procedures (Signed)
Interventional Radiology Procedure Note  Procedure: Ct guided biopsy of left retroperitoneal mass.  8 x 18 g core biopsy into formalin and saline. Complications: No immediate Recommendations:  - Ok to shower tomorrow - follow up results - Routine care   Signed,  Dulcy Fanny. Earleen Newport, DO

## 2014-05-31 NOTE — H&P (Signed)
Chief Complaint: Back pain New retroperitoneal mass  Referring Physician(s): Cornett,Thomas Dr Alen Blew  History of Present Illness: Darryl Griffith is a 69 y.o. male   Lt back and rib pain for months CT 04/19/14 reveals retroperitoneal mass 05/19/14 +PET Now scheduled for biopsy per Dr Alen Blew   Past Medical History  Diagnosis Date  . Hypertension   . Elevated cholesterol     Past Surgical History  Procedure Laterality Date  . Surgery for arm fracture Right 1952  . Knee arthroscopy Right 1970's  . Colonoscopy with propofol N/A 10/20/2013    Procedure: COLONOSCOPY WITH PROPOFOL;  Surgeon: Lear Ng, MD;  Location: [facility] ENDOSCOPY;  Service: Endoscopy;  Laterality: N/A;  . Hot hemostasis N/A 10/20/2013    Procedure: HOT HEMOSTASIS (ARGON PLASMA COAGULATION/BICAP);  Surgeon: Lear Ng, MD;  Location: [facility];  Service: Endoscopy;  Laterality: N/A;    Allergies: Review of patient's allergies indicates no known allergies.  Medications: Prior to Admission medications   Medication Sig Start Date End Date Taking? Authorizing Provider  aspirin EC 81 MG tablet Take 81 mg by mouth at bedtime.   Yes Historical Provider, MD  Coenzyme Q10 (COQ10) 100 MG CAPS Take 100-200 mg by mouth 2 (two) times daily. Take 1 tablet (100 mg) every morning and 2 tablets (200 mg) every night   Yes Historical Provider, MD  fexofenadine (ALLEGRA) 180 MG tablet Take 180 mg by mouth at bedtime.   Yes Historical Provider, MD  Ginkgo Biloba (GNP GINGKO BILOBA EXTRACT PO) Take 120 mg by mouth 2 (two) times daily.   Yes Historical Provider, MD  LORazepam (ATIVAN) 1 MG tablet Take 1 tablet (1 mg total) by mouth every 8 (eight) hours as needed for anxiety. Patient taking differently: Take 1 mg by mouth once as needed for anxiety (prior to PET scan).  05/19/14  Yes Wyatt Portela, MD  losartan-hydrochlorothiazide (HYZAAR) 100-25 MG per tablet Take 1 tablet by mouth daily.    Yes Historical  Provider, MD  Multiple Minerals-Vitamins (CALCIUM CITRATE PLUS/MAGNESIUM) TABS Take 1 tablet by mouth daily. Calcium citrate 500 mg, magnesium 800 mg, zinc 10 mg   Yes Historical Provider, MD  Multiple Vitamin (MULTIVITAMIN WITH MINERALS) TABS tablet Take 1 tablet by mouth daily.   Yes Historical Provider, MD  Omega-3 Fatty Acids (FISH OIL) 1200 MG CAPS Take 2,400 mg by mouth 2 (two) times daily.   Yes Historical Provider, MD  OVER THE COUNTER MEDICATION Take 1 tablet by mouth at bedtime. Beta Sitosterol 375 mg   Yes Historical Provider, MD  OVER THE COUNTER MEDICATION Take 400-800 mg by mouth 2 (two) times daily. Bio Astin: 2 tablets (800 mg) every morning and 1 tablet (400 mg) every night   Yes Historical Provider, MD  simvastatin (ZOCOR) 40 MG tablet Take 40 mg by mouth at bedtime.    Yes Historical Provider, MD  traZODone (DESYREL) 50 MG tablet Take 50 mg by mouth at bedtime.    Yes Historical Provider, MD  vitamin C (ASCORBIC ACID) 500 MG tablet Take 500 mg by mouth daily.   Yes Historical Provider, MD  vitamin E 400 UNIT capsule Take 400 Units by mouth daily.   Yes Historical Provider, MD     History reviewed. No pertinent family history.  History   Social History  . Marital Status: Married    Spouse Name: N/A  . Number of Children: N/A  . Years of Education: N/A   Social History Main Topics  .  Smoking status: Never Smoker   . Smokeless tobacco: Never Used  . Alcohol Use: Yes     Comment: 2-3 beers per night  . Drug Use: No  . Sexual Activity: Not on file   Other Topics Concern  . None   Social History Narrative     Review of Systems: A 12 point ROS discussed and pertinent positives are indicated in the HPI above.  All other systems are negative.  Review of Systems  Constitutional: Negative for fever, activity change, appetite change and fatigue.  Respiratory: Negative for shortness of breath.   Cardiovascular: Negative for chest pain.  Gastrointestinal: Positive for  abdominal pain. Negative for nausea and vomiting.  Genitourinary: Positive for flank pain. Negative for difficulty urinating.  Musculoskeletal: Positive for back pain. Negative for gait problem.  Neurological: Negative for weakness.  Psychiatric/Behavioral: Negative for behavioral problems and confusion.    Vital Signs: BP 123/70 mmHg  Pulse 73  Temp(Src) 98.3 F (36.8 C) (Oral)  Resp 18  Ht 5\' 8"  (1.727 m)  Wt 96.163 kg (212 lb)  BMI 32.24 kg/m2  SpO2 97%  Physical Exam  Constitutional: He appears well-nourished.  Cardiovascular: Normal rate, regular rhythm and normal heart sounds.   No murmur heard. Pulmonary/Chest: Effort normal and breath sounds normal. He has no wheezes.  Abdominal: Soft. Bowel sounds are normal. There is no tenderness.  Musculoskeletal: Normal range of motion.  Neurological: He is alert.  Skin: Skin is warm and dry.  Psychiatric: He has a normal mood and affect. His behavior is normal. Judgment and thought content normal.  Nursing note and vitals reviewed.   Mallampati Score:  MD Evaluation Airway: WNL Heart: WNL Abdomen: WNL Chest/ Lungs: WNL ASA  Classification: 2 Mallampati/Airway Score: One  Imaging: US Scrotum  05/03/2014   CLINICAL DATA:  Increased vascularity along the left spermatic cord occurring left scrotum on recent CT. Concern for potential testicular mass  EXAM: ULTRASOUND OF SCROTUM  TECHNIQUE: Complete ultrasound examination of the testicles, epididymis, and other scrotal structures was performed.  COMPARISON:  CT abdomen and pelvis May 01, 2014  FINDINGS: Right testicle  Measurements: 5.0 x 2.6 x 3.7 cm. No mass or microlithiasis visualized. Color flow is seen in the right testis.  Left testicle  Measurements: 4.9 x 2.5 x 3.1 cm. No mass or microlithiasis visualized. Color flow is seen in the left testis.  Right epididymis:  Normal in size and appearance.  Left epididymis:  Normal in size and appearance.  Hydrocele: There are small  hydroceles bilaterally, slightly larger on the right than on the left.  Varicocele: There is a small varicocele on the left with Valsalva maneuver.  There are prominent vascular structures surrounding the spermatic cord extending into the left scrotum as noted on recent CT. There is no demonstrable scrotal wall thickening or scrotal abscess on either side.  IMPRESSION: No intratesticular or extratesticular mass on either side. The prominence of vascular structures extending into the left scrotum tracking along the spermatic cord are again noted. Suspect that this finding is due to venous insufficiency from the more superior adenopathy as opposed to a lesion arising from the scrotum itself. There are small hydroceles bilaterally, slightly larger on the right than on the left. There is a small varicocele on the left.   Electronically Signed   By: Lowella Grip M.D.   On: 05/03/2014 12:32   Ct Abdomen Pelvis W Contrast  05/01/2014   CLINICAL DATA:  Abdominal mass on chest CT. Painful  left posterior lateral rib. Hypertension and elevated cholesterol.  EXAM: CT ABDOMEN AND PELVIS WITH CONTRAST  TECHNIQUE: Multidetector CT imaging of the abdomen and pelvis was performed using the standard protocol following bolus administration of intravenous contrast.  CONTRAST:  138mL OMNIPAQUE IOHEXOL 300 MG/ML  SOLN  COMPARISON:  Chest CT of 04/19/2014.  FINDINGS: Lower chest: Clear lung bases. Normal heart size without pericardial or pleural effusion. Radiographic marker identified on image 27 of series 2, projecting superficial to the interspace between the ninth and tenth posterior lateral left ribs. No underlying mass or rib fracture identified.  Hepatobiliary: Normal liver. Normal gallbladder, without biliary ductal dilatation.  Pancreas: Normal, without mass or ductal dilatation.  Spleen: Normal  Adrenals/Urinary Tract: Normal adrenal glands. Suspicion of a central interpolar right renal 1.2 cm cyst. Normal left kidney. No  hydronephrosis. Normal urinary bladder.  Stomach/Bowel: Extensive colonic diverticulosis with muscular hypertrophy involving the sigmoid. Normal terminal ileum. Normal small bowel.  Vascular/Lymphatic: Aortic and branch vessel atherosclerosis, including at the origin left renal artery (which remains patent).  Left retroperitoneal adenopathy. A node immediately adjacent the left adrenal gland measures 4.0 x 3.2 cm on image 27. Nodal conglomerate surrounding the left renal artery measures 9.5 x 5.5 cm on image 34.  Reproductive: Normal prostate. Increased vascularity along the left spermatic cord and entering the left hemiscrotum. Right hydrocele.  Other: No significant free fluid.  Musculoskeletal: Lumbosacral degenerative disc disease and spondylosis.  IMPRESSION: 1. Bulky retroperitoneal adenopathy. 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 Concurrent left scrotal neovascularity could be related to a testicular primary or venous insufficiency due to central mass. Lymphoproliferative process such as lymphoma is felt less likely. Testicular ultrasound should be considered. 2. Extensive atherosclerosis.   Electronically Signed   By: Abigail Miyamoto M.D.   On: 05/01/2014 16:56   Nm Pet Image Initial (pi) Skull Base To Thigh  05/19/2014   CLINICAL DATA:  Initial treatment strategy for left retroperitoneal mass.  EXAM: NUCLEAR MEDICINE PET SKULL BASE TO THIGH  TECHNIQUE: 11.5 mCi F-18 FDG was injected intravenously. Full-ring PET imaging was performed from the skull base to thigh after the radiotracer. CT data was obtained and used for attenuation correction and anatomic localization.  FASTING BLOOD GLUCOSE:  Value: 85 mg/dl  COMPARISON:  CT 05/01/2014  FINDINGS: NECK  No hypermetabolic lymph nodes in the neck.  CHEST  No hypermetabolic mediastinal or hilar nodes. No suspicious pulmonary nodules on the CT scan. 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 There are 2 dominant masses measuring 6.1 x 5.5 and 4.2 x 3.9. The larger more ventral mass has SUV max 10.6 while the smaller more dorsal mass has SUV max 5.8. There are several additional smaller nodes/masses in a 4 cm conglomerate on image 122.  Spleen is normal volume with no abnormal metabolic activity. There is no hypermetabolic inguinal lymph nodes or iliac lymph nodes.  Normal metabolic activity within the testicles.  SKELETON  No focal hypermetabolic activity to suggest skeletal metastasis.  IMPRESSION: 1. 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 2. No evidence of adenopathy in the chest or pelvis. 3. Normal spleen. 4. Normal marrow activity.   Electronically Signed   By: Suzy Bouchard M.D.   On: 05/19/2014 16:15    Labs:  CBC: No results for input(s): WBC, HGB, HCT,  PLT in the last 8760 hours.  COAGS: No results for input(s): INR, APTT in the last 8760 hours.  BMP: No results for input(s): NA, K, CL, CO2, GLUCOSE, BUN, CALCIUM, CREATININE, GFRNONAA, GFRAA in the last 8760 hours.  Invalid input(s): CMP  LIVER FUNCTION TESTS: No results for input(s): BILITOT, AST, ALT, ALKPHOS, PROT, ALBUMIN in the last 8760 hours.  TUMOR MARKERS: No results for input(s): AFPTM, CEA, CA199, CHROMGRNA in the last 8760 hours.  Assessment and Plan:  Lt rib/back pain L RP mass Now for bx Pt aware of procedure benefits and risks including but not limited to Infection, bleeding; damage to surrounding structures  Thank you for this interesting consult.  I greatly enjoyed meeting Darryl Griffith and look forward to participating in their care.  Signed: Cassady Turano A 05/31/2014, 8:19 AM   I spent a total of  20 Minutes   in face to face in clinical consultation, greater than 50% of which was  counseling/coordinating care for Retroperitoneal mass bx

## 2014-06-05 ENCOUNTER — Telehealth (INDEPENDENT_AMBULATORY_CARE_PROVIDER_SITE_OTHER): Payer: Self-pay | Admitting: General Surgery

## 2014-06-05 NOTE — Telephone Encounter (Signed)
Discussed biopsy with patient and why he would be coming to see me.  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

## 2014-06-13 ENCOUNTER — Other Ambulatory Visit (INDEPENDENT_AMBULATORY_CARE_PROVIDER_SITE_OTHER): Payer: Self-pay | Admitting: General Surgery

## 2014-07-05 ENCOUNTER — Encounter (HOSPITAL_COMMUNITY)
Admission: RE | Admit: 2014-07-05 | Discharge: 2014-07-05 | Disposition: A | Discharge status: Home / Self Care | Payer: Medicare Other | Source: Ambulatory Visit | Attending: General Surgery | Admitting: General Surgery

## 2014-07-05 ENCOUNTER — Encounter (HOSPITAL_COMMUNITY): Payer: Self-pay

## 2014-07-05 DIAGNOSIS — Z01818 Encounter for other preprocedural examination: Secondary | ICD-10-CM | POA: Diagnosis not present

## 2014-07-05 HISTORY — DX: Angina pectoris, unspecified: I20.9

## 2014-07-05 LAB — COMPREHENSIVE METABOLIC PANEL
ALBUMIN: 4.2 g/dL (ref 3.5–5.2)
ALT: 20 U/L (ref 0–53)
AST: 21 U/L (ref 0–37)
Alkaline Phosphatase: 58 U/L (ref 39–117)
Anion gap: 10 (ref 5–15)
BILIRUBIN TOTAL: 0.6 mg/dL (ref 0.3–1.2)
BUN: 22 mg/dL (ref 6–23)
CHLORIDE: 101 mmol/L (ref 96–112)
CO2: 28 mmol/L (ref 19–32)
CREATININE: 1.17 mg/dL (ref 0.50–1.35)
Calcium: 9.4 mg/dL (ref 8.4–10.5)
GFR calc non Af Amer: 62 mL/min — ABNORMAL LOW (ref >90)
GFR, EST AFRICAN AMERICAN: 72 mL/min — AB (ref >90)
Glucose, Bld: 93 mg/dL (ref 70–99)
Potassium: 3.8 mmol/L (ref 3.5–5.1)
Sodium: 139 mmol/L (ref 135–145)
Total Protein: 7.5 g/dL (ref 6.0–8.3)

## 2014-07-05 LAB — CBC WITH DIFFERENTIAL/PLATELET
BASOS PCT: 0 % (ref 0–1)
Basophils Absolute: 0 10*3/uL (ref 0.0–0.1)
Eosinophils Absolute: 0.1 10*3/uL (ref 0.0–0.7)
Eosinophils Relative: 2 % (ref 0–5)
HCT: 40.6 % (ref 39.0–52.0)
Hemoglobin: 14.3 g/dL (ref 13.0–17.0)
LYMPHS PCT: 24 % (ref 12–46)
Lymphs Abs: 1.5 10*3/uL (ref 0.7–4.0)
MCH: 32.8 pg (ref 26.0–34.0)
MCHC: 35.2 g/dL (ref 30.0–36.0)
MCV: 93.1 fL (ref 78.0–100.0)
MONO ABS: 0.6 10*3/uL (ref 0.1–1.0)
Monocytes Relative: 10 % (ref 3–12)
NEUTROS ABS: 4 10*3/uL (ref 1.7–7.7)
NEUTROS PCT: 64 % (ref 43–77)
Platelets: 265 10*3/uL (ref 150–400)
RBC: 4.36 MIL/uL (ref 4.22–5.81)
RDW: 13.2 % (ref 11.5–15.5)
WBC: 6.2 10*3/uL (ref 4.0–10.5)

## 2014-07-05 LAB — AMYLASE: AMYLASE: 53 U/L (ref 0–105)

## 2014-07-05 LAB — URINALYSIS, ROUTINE W REFLEX MICROSCOPIC
BILIRUBIN URINE: NEGATIVE
Glucose, UA: NEGATIVE mg/dL
Hgb urine dipstick: NEGATIVE
Ketones, ur: NEGATIVE mg/dL
Leukocytes, UA: NEGATIVE
Nitrite: NEGATIVE
Protein, ur: NEGATIVE mg/dL
Specific Gravity, Urine: 1.011 (ref 1.005–1.030)
UROBILINOGEN UA: 0.2 mg/dL (ref 0.0–1.0)
pH: 7 (ref 5.0–8.0)

## 2014-07-05 LAB — APTT: aPTT: 37 seconds (ref 24–37)

## 2014-07-05 LAB — PROTIME-INR
INR: 1.06 (ref 0.00–1.49)
Prothrombin Time: 13.9 seconds (ref 11.6–15.2)

## 2014-07-05 NOTE — Progress Notes (Addendum)
H&P per Dr Wynonia Lawman per chart 09/20/2013  EKG per chart 10/03/2013  Treadmill results per chart 10/03/2013  CT abdomen and pelvis with contrast per epic 05/01/2014  OV note per epic 05/11/2014 Dr Zola Button  Pt states he has been sleeping in a chair since October 2015 due to the pain with lying down

## 2014-07-05 NOTE — Progress Notes (Signed)
Anesthesia consult ordered. This nurse spoke with Dr Marcell Barlow in regards to order. This nurse informed anesthesia that pt is now requesting to not have an epidural. Anesthesia to see pt day of surgery.

## 2014-07-05 NOTE — Patient Instructions (Signed)
Darryl Griffith  07/05/2014   Your procedure is scheduled on:       Thursday July 13, 2014   Report to [facility] Main Entrance and follow signs to  [facility] arrive at [place].   Call this number if you have problems the morning of surgery [phone] or Presurgical Testing [phone].   Remember:  Do not eat food or drink liquids :After Midnight.  For Living Will and/or Health Care Power Attorney Forms: please provide copy for your medical record, may bring AM of surgery (forms should be already notarized-we do not provide this service).  Remember: follow any bowel prep instructions per MD office. Fleets enema night prior to surgery.     Take these medicines the morning of surgery with A SIP OF WATER: NONE                               You may not have any metal on your body including hair pins and piercings  Do not wear jewelry, colognes, lotions, powders, or deodorant.  Men may shave face and neck.               Do not bring valuables to the hospital. [place].  Contacts, dentures or bridgework may not be worn into surgery.  Leave suitcase in the car. After surgery it may be brought to your room.  For patients admitted to the hospital, checkout time is 11:00 AM the day of discharge.     Special Instructions: review fact sheets for  Blood Transfusion fact sheet ________________________________________________________________________  [facility] - Preparing for Surgery Before surgery, you can play an important role.  Because skin is not sterile, your skin needs to be as free of germs as possible.  You can reduce the number of germs on your skin by washing with CHG (chlorahexidine gluconate) soap before surgery.  CHG is an antiseptic cleaner which kills germs and bonds with the skin to continue killing germs even after washing. Please DO NOT use if you have an allergy to CHG or antibacterial soaps.  If your skin becomes  reddened/irritated stop using the CHG and inform your nurse when you arrive at Short Stay. Do not shave (including legs and underarms) for at least 48 hours prior to the first CHG shower.  You may shave your face/neck. Please follow these instructions carefully:  1.  Shower with CHG Soap the night before surgery and the  morning of Surgery.  2.  If you choose to wash your hair, wash your hair first as usual with your  normal  shampoo.  3.  After you shampoo, rinse your hair and body thoroughly to remove the  shampoo.                           4.  Use CHG as you would any other liquid soap.  You can apply chg directly  to the skin and wash                       Gently with a scrungie or clean washcloth.  5.  Apply the CHG Soap to your body ONLY FROM THE NECK DOWN.   Do not use on face/ open  Wound or open sores. Avoid contact with eyes, ears mouth and genitals (private parts).                       Wash face,  Genitals (private parts) with your normal soap.             6.  Wash thoroughly, paying special attention to the area where your surgery  will be performed.  7.  Thoroughly rinse your body with warm water from the neck down.  8.  DO NOT shower/wash with your normal soap after using and rinsing off  the CHG Soap.                9.  Pat yourself dry with a clean towel.            10.  Wear clean pajamas.            11.  Place clean sheets on your bed the night of your first shower and do not  sleep with pets. Day of Surgery : Do not apply any lotions/deodorants the morning of surgery.  Please wear clean clothes to the hospital/surgery center.  FAILURE TO FOLLOW THESE INSTRUCTIONS MAY RESULT IN THE CANCELLATION OF YOUR SURGERY PATIENT SIGNATURE_________________________________  NURSE SIGNATURE__________________________________  ________________________________________________________________________  WHAT IS A BLOOD TRANSFUSION? Blood Transfusion Information  A  transfusion is the replacement of blood or some of its parts. Blood is made up of multiple cells which provide different functions.  Red blood cells carry oxygen and are used for blood loss replacement.  White blood cells fight against infection.  Platelets control bleeding.  Plasma helps clot blood.  Other blood products are available for specialized needs, such as hemophilia or other clotting disorders. BEFORE THE TRANSFUSION  Who gives blood for transfusions?   Healthy volunteers who are fully evaluated to make sure their blood is safe. This is blood bank blood. Transfusion therapy is the safest it has ever been in the practice of medicine. Before blood is taken from a donor, a complete history is taken to make sure that person has no history of diseases nor engages in risky social behavior (examples are intravenous drug use or sexual activity with multiple partners). The donor's travel history is screened to minimize risk of transmitting infections, such as malaria. The donated blood is tested for signs of infectious diseases, such as HIV and hepatitis. The blood is then tested to be sure it is compatible with you in order to minimize the chance of a transfusion reaction. If you or a relative donates blood, this is often done in anticipation of surgery and is not appropriate for emergency situations. It takes many days to process the donated blood. RISKS AND COMPLICATIONS Although transfusion therapy is very safe and saves many lives, the main dangers of transfusion include:   Getting an infectious disease.  Developing a transfusion reaction. This is an allergic reaction to something in the blood you were given. Every precaution is taken to prevent this. The decision to have a blood transfusion has been considered carefully by your caregiver before blood is given. Blood is not given unless the benefits outweigh the risks. AFTER THE TRANSFUSION  Right after receiving a blood transfusion,  you will usually feel much better and more energetic. This is especially true if your red blood cells have gotten low (anemic). The transfusion raises the level of the red blood cells which carry oxygen, and this usually causes an energy increase.  The nurse administering the transfusion will monitor you carefully for complications. HOME CARE INSTRUCTIONS  No special instructions are needed after a transfusion. You may find your energy is better. Speak with your caregiver about any limitations on activity for underlying diseases you may have. SEEK MEDICAL CARE IF:   Your condition is not improving after your transfusion.  You develop redness or irritation at the intravenous (IV) site. SEEK IMMEDIATE MEDICAL CARE IF:  Any of the following symptoms occur over the next 12 hours:  Shaking chills.  You have a temperature by mouth above 102 F (38.9 C), not controlled by medicine.  Chest, back, or muscle pain.  People around you feel you are not acting correctly or are confused.  Shortness of breath or difficulty breathing.  Dizziness and fainting.  You get a rash or develop hives.  You have a decrease in urine output.  Your urine turns a dark color or changes to pink, red, or brown. Any of the following symptoms occur over the next 10 days:  You have a temperature by mouth above 102 F (38.9 C), not controlled by medicine.  Shortness of breath.  Weakness after normal activity.  The white part of the eye turns yellow (jaundice).  You have a decrease in the amount of urine or are urinating less often.  Your urine turns a dark color or changes to pink, red, or brown. Document Released: 03/14/2000 Document Revised: 06/09/2011 Document Reviewed: 11/01/2007 [facility] Patient Information 2014 [place], [place].  _______________________________________________________________________

## 2014-07-12 NOTE — H&P (Signed)
Darryl Griffith 06/13/2014 9:58 AM Location: [facility] Patient #: [id number] DOB: 1946/02/09 Married / Language: Cleophus Molt / Race: White Male  History of Present Illness Stark Klein MD; 06/13/2014 10:51 AM) Patient words: eval mass.  The patient is a 69 year old male who presents with an abdominal mass. Patient is a 69 year old male who was seen by Dr. Brantley Stage around 1-2 weeks ago. He was first evaluated by medical oncology and felt to have a possible lymphoma. He was referred for biopsy. Given the retroperitoneal nature of it, Dr. Brantley Stage requested a core needle biopsy from interventional radiology. 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 He is here to discuss surgery. He did undergo a ultrasound the scrotum which did not demonstrate any evidence of testicular mass. He had a PET/CT which was negative for evidence of spread. The main reason this was found, is that the patient was having left back and flank pain that was interfering with his ability to sleep. He originally had this mass noted on chest CT at the end of January. He denies any change in appetite or bowel changes. He has not had fevers, chills, or night sweats. He had gained some weight previously, but has deliberately been attempting to lose several pounds since this mass was found and he was noted to need surgery. He is quite energetic for a 69 year old and says he feels as good as he didn't 26.   Allergies Elbert Ewings, CMA; 06/13/2014 10:06 AM) No Known Drug Allergies02/26/2016  Medication History Elbert Ewings, CMA; 06/13/2014 10:07 AM) TraZODone HCl (50MG  Tablet, Oral) Active. Simvastatin (40MG  Tablet, Oral) Active. Losartan Potassium-HCTZ (100-25MG  Tablet, Oral) Active. Aspirin (81MG  Tablet, Oral) Active. 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  I discussed that we would do this through a midline incision. The tumor does appear to be lobulated with the portion above the left renal artery and a portion below the left renal artery. I discussed that we may need to take the left kidney out. I reviewed that there is a reasonable risk of bleeding from this tumor. I also discussed that we may need to leave a drain or take out a portion of the pancreas. I discussed the risk of wound problems including hernia, possible need for additional surgery or procedures, heart or lung complications, and death. I discussed that the cancer could recur. I also reviewed that ultimately the prognosis depends on the final pathology. I will leave metal clips around the border of the tumor in order to assist with possible need for radiation planning. The patient is planning to move to [facility] and [place] in June. He may need to pursue radiation therapy there. I will schedule this in the first possible opportunity. 30 minutes were spent in evaluation, examination, counseling, and coordination of care. Through the 50% of the visit was spent in counseling. Patient would be a good candidate for an epidural. I will ask anesthesia to consult Current Plans  Schedule for Surgery Pt Education - Retroperitoneal mass   Signed  by Stark Klein, MD (06/13/2014 10:56 AM)

## 2014-07-13 ENCOUNTER — Inpatient Hospital Stay (HOSPITAL_COMMUNITY): Payer: Medicare Other

## 2014-07-13 ENCOUNTER — Inpatient Hospital Stay (HOSPITAL_COMMUNITY): Payer: Medicare Other | Admitting: Registered Nurse

## 2014-07-13 ENCOUNTER — Encounter (HOSPITAL_COMMUNITY): Payer: Self-pay | Admitting: *Deleted

## 2014-07-13 ENCOUNTER — Inpatient Hospital Stay (HOSPITAL_COMMUNITY)
Admission: RE | Admit: 2014-07-13 | Discharge: 2014-07-23 | DRG: 826 | Disposition: A | Discharge status: Home Health | Payer: Medicare Other | Source: Ambulatory Visit | Attending: General Surgery | Admitting: General Surgery

## 2014-07-13 ENCOUNTER — Encounter (HOSPITAL_COMMUNITY)
Admission: RE | Disposition: A | Discharge status: Home Health | Payer: Self-pay | Source: Ambulatory Visit | Attending: General Surgery

## 2014-07-13 DIAGNOSIS — C480 Malignant neoplasm of retroperitoneum: Secondary | ICD-10-CM | POA: Diagnosis present

## 2014-07-13 DIAGNOSIS — Z452 Encounter for adjustment and management of vascular access device: Secondary | ICD-10-CM

## 2014-07-13 DIAGNOSIS — Z79899 Other long term (current) drug therapy: Secondary | ICD-10-CM | POA: Diagnosis not present

## 2014-07-13 DIAGNOSIS — D62 Acute posthemorrhagic anemia: Secondary | ICD-10-CM | POA: Diagnosis not present

## 2014-07-13 DIAGNOSIS — T8119XA Other postprocedural shock, initial encounter: Secondary | ICD-10-CM | POA: Diagnosis not present

## 2014-07-13 DIAGNOSIS — R579 Shock, unspecified: Secondary | ICD-10-CM | POA: Diagnosis not present

## 2014-07-13 DIAGNOSIS — E785 Hyperlipidemia, unspecified: Secondary | ICD-10-CM | POA: Diagnosis present

## 2014-07-13 DIAGNOSIS — J95821 Acute postprocedural respiratory failure: Secondary | ICD-10-CM | POA: Diagnosis not present

## 2014-07-13 DIAGNOSIS — J9601 Acute respiratory failure with hypoxia: Secondary | ICD-10-CM

## 2014-07-13 DIAGNOSIS — D696 Thrombocytopenia, unspecified: Secondary | ICD-10-CM

## 2014-07-13 DIAGNOSIS — D65 Disseminated intravascular coagulation [defibrination syndrome]: Secondary | ICD-10-CM | POA: Diagnosis not present

## 2014-07-13 DIAGNOSIS — N179 Acute kidney failure, unspecified: Secondary | ICD-10-CM | POA: Diagnosis present

## 2014-07-13 DIAGNOSIS — E876 Hypokalemia: Secondary | ICD-10-CM | POA: Diagnosis not present

## 2014-07-13 DIAGNOSIS — K567 Ileus, unspecified: Secondary | ICD-10-CM | POA: Diagnosis not present

## 2014-07-13 DIAGNOSIS — Z7982 Long term (current) use of aspirin: Secondary | ICD-10-CM | POA: Diagnosis not present

## 2014-07-13 DIAGNOSIS — I10 Essential (primary) hypertension: Secondary | ICD-10-CM | POA: Diagnosis present

## 2014-07-13 DIAGNOSIS — R1900 Intra-abdominal and pelvic swelling, mass and lump, unspecified site: Secondary | ICD-10-CM

## 2014-07-13 DIAGNOSIS — Z978 Presence of other specified devices: Secondary | ICD-10-CM

## 2014-07-13 HISTORY — DX: Intra-abdominal and pelvic swelling, mass and lump, unspecified site: R19.00

## 2014-07-13 HISTORY — PX: RESECTION OF RETROPERITONEAL MASS: SHX6340

## 2014-07-13 LAB — CBC
HCT: 20 % — ABNORMAL LOW (ref 39.0–52.0)
HCT: 20.9 % — ABNORMAL LOW (ref 39.0–52.0)
HCT: 17.6 % — ABNORMAL LOW (ref 39.0–52.0)
HEMOGLOBIN: 6.7 g/dL — AB (ref 13.0–17.0)
Hemoglobin: 7.2 g/dL — ABNORMAL LOW (ref 13.0–17.0)
Hemoglobin: 6 g/dL — CL (ref 13.0–17.0)
MCH: 29.4 pg (ref 26.0–34.0)
MCH: 29.9 pg (ref 26.0–34.0)
MCH: 29.4 pg (ref 26.0–34.0)
MCHC: 33.5 g/dL (ref 30.0–36.0)
MCHC: 34.4 g/dL (ref 30.0–36.0)
MCHC: 34.1 g/dL (ref 30.0–36.0)
MCV: 87.7 fL (ref 78.0–100.0)
MCV: 86.7 fL (ref 78.0–100.0)
MCV: 86.3 fL (ref 78.0–100.0)
PLATELETS: 57 10*3/uL — AB (ref 150–400)
PLATELETS: 46 10*3/uL — AB (ref 150–400)
Platelets: 90 10*3/uL — ABNORMAL LOW (ref 150–400)
RBC: 2.28 MIL/uL — AB (ref 4.22–5.81)
RBC: 2.41 MIL/uL — AB (ref 4.22–5.81)
RBC: 2.04 MIL/uL — ABNORMAL LOW (ref 4.22–5.81)
RDW: 17.9 % — ABNORMAL HIGH (ref 11.5–15.5)
RDW: 16.2 % — ABNORMAL HIGH (ref 11.5–15.5)
RDW: 16.2 % — ABNORMAL HIGH (ref 11.5–15.5)
WBC: 4.7 10*3/uL (ref 4.0–10.5)
WBC: 6.7 10*3/uL (ref 4.0–10.5)
WBC: 5.1 10*3/uL (ref 4.0–10.5)

## 2014-07-13 LAB — BLOOD GAS, ARTERIAL
ACID-BASE DEFICIT: 7.1 mmol/L — AB (ref 0.0–2.0)
Acid-Base Excess: 0 mmol/L (ref 0.0–2.0)
Bicarbonate: 20.4 mEq/L (ref 20.0–24.0)
Bicarbonate: 24.6 mEq/L — ABNORMAL HIGH (ref 20.0–24.0)
DRAWN BY: 331471
DRAWN BY: 31814
FIO2: 1 %
FIO2: 0.5 %
LHR: 15 {breaths}/min
MECHVT: 550 mL
O2 SAT: 99.8 %
O2 Saturation: 99.9 %
PCO2 ART: 38.2 mmHg (ref 35.0–45.0)
PEEP: 5 cmH2O
PEEP: 5 cmH2O
PH ART: 7.412 (ref 7.350–7.450)
PO2 ART: 476 mmHg — AB (ref 80.0–100.0)
PO2 ART: 209 mmHg — AB (ref 80.0–100.0)
Patient temperature: 98.6
Patient temperature: 94.3
RATE: 20 resp/min
TCO2: 19.5 mmol/L (ref 0–100)
TCO2: 23.8 mmol/L (ref 0–100)
VT: 550 mL
pCO2 arterial: 52.7 mmHg — ABNORMAL HIGH (ref 35.0–45.0)
pH, Arterial: 7.212 — ABNORMAL LOW (ref 7.350–7.450)

## 2014-07-13 LAB — POCT I-STAT EG7
Acid-base deficit: 4 mmol/L — ABNORMAL HIGH (ref 0.0–2.0)
Bicarbonate: 22.7 meq/L (ref 20.0–24.0)
Calcium, Ion: 1.16 mmol/L (ref 1.13–1.30)
HCT: 26 % — ABNORMAL LOW (ref 39.0–52.0)
Hemoglobin: 8.8 g/dL — ABNORMAL LOW (ref 13.0–17.0)
O2 Saturation: 68 %
Patient temperature: 36.7
Potassium: 4 mmol/L (ref 3.5–5.1)
Sodium: 136 mmol/L (ref 135–145)
TCO2: 24 mmol/L (ref 0–100)
pCO2, Ven: 49.2 mmHg (ref 45.0–50.0)
pH, Ven: 7.271 (ref 7.250–7.300)
pO2, Ven: 40 mmHg (ref 30.0–45.0)

## 2014-07-13 LAB — DIC (DISSEMINATED INTRAVASCULAR COAGULATION)PANEL
D-Dimer, Quant: 2.29 ug{FEU}/mL — ABNORMAL HIGH (ref 0.00–0.48)
Fibrinogen: 78 mg/dL — CL (ref 204–475)
INR: 2.54 — ABNORMAL HIGH (ref 0.00–1.49)
Platelets: 54 K/uL — ABNORMAL LOW (ref 150–400)
Prothrombin Time: 27.6 s — ABNORMAL HIGH (ref 11.6–15.2)
Smear Review: NONE SEEN
aPTT: 74 s — ABNORMAL HIGH (ref 24–37)

## 2014-07-13 LAB — BASIC METABOLIC PANEL
ANION GAP: 6 (ref 5–15)
BUN: 25 mg/dL — ABNORMAL HIGH (ref 6–23)
CALCIUM: 6.7 mg/dL — AB (ref 8.4–10.5)
CO2: 27 mmol/L (ref 19–32)
Chloride: 108 mmol/L (ref 96–112)
Creatinine, Ser: 1.52 mg/dL — ABNORMAL HIGH (ref 0.50–1.35)
GFR calc Af Amer: 52 mL/min — ABNORMAL LOW (ref >90)
GFR, EST NON AFRICAN AMERICAN: 45 mL/min — AB (ref >90)
GLUCOSE: 194 mg/dL — AB (ref 70–99)
Potassium: 3.7 mmol/L (ref 3.5–5.1)
SODIUM: 141 mmol/L (ref 135–145)

## 2014-07-13 LAB — PREPARE RBC (CROSSMATCH)

## 2014-07-13 LAB — MRSA PCR SCREENING: MRSA by PCR: NEGATIVE

## 2014-07-13 LAB — PROTIME-INR
INR: 1.42 (ref 0.00–1.49)
INR: 1.42 (ref 0.00–1.49)
Prothrombin Time: 17.5 seconds — ABNORMAL HIGH (ref 11.6–15.2)
Prothrombin Time: 17.5 seconds — ABNORMAL HIGH (ref 11.6–15.2)

## 2014-07-13 LAB — ABO/RH: ABO/RH(D): O NEG

## 2014-07-13 LAB — APTT: APTT: 40 s — AB (ref 24–37)

## 2014-07-13 LAB — FIBRINOGEN: FIBRINOGEN: 241 mg/dL (ref 204–475)

## 2014-07-13 SURGERY — EXCISION, MASS, RETROPERITONEUM
Anesthesia: General | Laterality: Left

## 2014-07-13 MED ORDER — CETYLPYRIDINIUM CHLORIDE 0.05 % MT LIQD
7.0000 mL | Freq: Four times a day (QID) | OROMUCOSAL | Status: DC
  Administered 2014-07-14 – 2014-07-16 (×10): 7 mL via OROMUCOSAL

## 2014-07-13 MED ORDER — FENTANYL BOLUS VIA INFUSION
25.0000 ug | INTRAVENOUS | Status: DC | PRN
  Filled 2014-07-13: qty 25

## 2014-07-13 MED ORDER — CHLORHEXIDINE GLUCONATE 0.12 % MT SOLN
15.0000 mL | Freq: Two times a day (BID) | OROMUCOSAL | Status: DC
  Administered 2014-07-14 – 2014-07-16 (×6): 15 mL via OROMUCOSAL
  Filled 2014-07-13 (×5): qty 15

## 2014-07-13 MED ORDER — BUPIVACAINE-EPINEPHRINE (PF) 0.25% -1:200000 IJ SOLN
INTRAMUSCULAR | Status: AC
  Filled 2014-07-13: qty 30

## 2014-07-13 MED ORDER — DEXAMETHASONE SODIUM PHOSPHATE 10 MG/ML IJ SOLN
INTRAMUSCULAR | Status: AC
  Filled 2014-07-13: qty 1

## 2014-07-13 MED ORDER — SODIUM CHLORIDE 0.9 % IV SOLN
Freq: Once | INTRAVENOUS | Status: AC
  Administered 2014-07-13: 20:00:00 via INTRAVENOUS

## 2014-07-13 MED ORDER — SODIUM BICARBONATE 8.4 % IV SOLN
INTRAVENOUS | Status: AC
  Filled 2014-07-13: qty 100

## 2014-07-13 MED ORDER — ALBUMIN HUMAN 5 % IV SOLN
INTRAVENOUS | Status: AC
  Filled 2014-07-13: qty 250

## 2014-07-13 MED ORDER — PROPOFOL 10 MG/ML IV BOLUS
INTRAVENOUS | Status: AC
  Filled 2014-07-13: qty 20

## 2014-07-13 MED ORDER — PHENYLEPHRINE HCL 10 MG/ML IJ SOLN
INTRAMUSCULAR | Status: AC
  Filled 2014-07-13: qty 2

## 2014-07-13 MED ORDER — ROCURONIUM BROMIDE 100 MG/10ML IV SOLN
INTRAVENOUS | Status: AC
  Filled 2014-07-13: qty 1

## 2014-07-13 MED ORDER — CALCIUM CHLORIDE 10 % IV SOLN
INTRAVENOUS | Status: DC | PRN
  Administered 2014-07-13 (×2): .5 g via INTRAVENOUS

## 2014-07-13 MED ORDER — SUCCINYLCHOLINE CHLORIDE 20 MG/ML IJ SOLN
INTRAMUSCULAR | Status: DC | PRN
  Administered 2014-07-13: 100 mg via INTRAVENOUS

## 2014-07-13 MED ORDER — SODIUM CHLORIDE 0.9 % IV SOLN
Freq: Once | INTRAVENOUS | Status: AC
  Administered 2014-07-14: via INTRAVENOUS

## 2014-07-13 MED ORDER — 0.9 % SODIUM CHLORIDE (POUR BTL) OPTIME
TOPICAL | Status: DC | PRN
  Administered 2014-07-13: 6000 mL

## 2014-07-13 MED ORDER — PHENYLEPHRINE HCL 10 MG/ML IJ SOLN
20.0000 mg | INTRAMUSCULAR | Status: DC | PRN
  Administered 2014-07-13: 50 ug/min via INTRAVENOUS

## 2014-07-13 MED ORDER — DEXAMETHASONE SODIUM PHOSPHATE 10 MG/ML IJ SOLN
INTRAMUSCULAR | Status: DC | PRN
  Administered 2014-07-13: 10 mg via INTRAVENOUS

## 2014-07-13 MED ORDER — ROCURONIUM BROMIDE 100 MG/10ML IV SOLN
INTRAVENOUS | Status: DC | PRN
Start: 2014-07-13 — End: 2014-07-13
  Administered 2014-07-13: 10 mg via INTRAVENOUS
  Administered 2014-07-13: 40 mg via INTRAVENOUS
  Administered 2014-07-13: 10 mg via INTRAVENOUS
  Administered 2014-07-13: 40 mg via INTRAVENOUS
  Administered 2014-07-13: 60 mg via INTRAVENOUS
  Administered 2014-07-13 (×3): 10 mg via INTRAVENOUS

## 2014-07-13 MED ORDER — BUPIVACAINE HCL (PF) 0.25 % IJ SOLN
INTRAMUSCULAR | Status: AC
  Filled 2014-07-13: qty 30

## 2014-07-13 MED ORDER — MIDAZOLAM HCL 2 MG/2ML IJ SOLN
1.0000 mg | INTRAMUSCULAR | Status: DC | PRN
Start: 2014-07-13 — End: 2014-07-14

## 2014-07-13 MED ORDER — THROMBIN 5000 UNITS EX SOLR
CUTANEOUS | Status: AC
  Filled 2014-07-13: qty 20000

## 2014-07-13 MED ORDER — PHENYLEPHRINE HCL 10 MG/ML IJ SOLN
INTRAMUSCULAR | Status: DC | PRN
  Administered 2014-07-13: 40 ug via INTRAVENOUS
  Administered 2014-07-13 (×3): 80 ug via INTRAVENOUS
  Administered 2014-07-13 (×2): 40 ug via INTRAVENOUS

## 2014-07-13 MED ORDER — ALBUMIN HUMAN 5 % IV SOLN
INTRAVENOUS | Status: AC
  Filled 2014-07-13: qty 500

## 2014-07-13 MED ORDER — ONDANSETRON HCL 4 MG/2ML IJ SOLN
INTRAMUSCULAR | Status: AC
  Filled 2014-07-13: qty 2

## 2014-07-13 MED ORDER — EPHEDRINE SULFATE 50 MG/ML IJ SOLN
INTRAMUSCULAR | Status: AC
  Filled 2014-07-13: qty 1

## 2014-07-13 MED ORDER — SODIUM BICARBONATE 8.4 % IV SOLN
INTRAVENOUS | Status: DC | PRN
  Administered 2014-07-13: 100 meq via INTRAVENOUS
  Administered 2014-07-13: 150 meq via INTRAVENOUS

## 2014-07-13 MED ORDER — LACTATED RINGERS IV SOLN
INTRAVENOUS | Status: DC | PRN
  Administered 2014-07-13: 12:00:00 via INTRAVENOUS

## 2014-07-13 MED ORDER — BUPIVACAINE 0.25 % ON-Q PUMP DUAL CATH 300 ML
300.0000 mL | INJECTION | Status: DC
  Administered 2014-07-13: 300 mL
  Filled 2014-07-13: qty 300

## 2014-07-13 MED ORDER — PHENYLEPHRINE HCL 10 MG/ML IJ SOLN
0.0000 ug/min | INTRAVENOUS | Status: DC
  Administered 2014-07-13: 30 ug/min via INTRAVENOUS
  Administered 2014-07-13: 60 ug/min via INTRAVENOUS
  Administered 2014-07-13: 40 ug/min via INTRAVENOUS
  Administered 2014-07-13 – 2014-07-14 (×2): 60 ug/min via INTRAVENOUS
  Administered 2014-07-14: 50 ug/min via INTRAVENOUS
  Filled 2014-07-13 (×5): qty 1

## 2014-07-13 MED ORDER — EPINEPHRINE HCL 0.1 MG/ML IJ SOSY
PREFILLED_SYRINGE | INTRAMUSCULAR | Status: DC | PRN
  Administered 2014-07-13: .1 mg via INTRAVENOUS

## 2014-07-13 MED ORDER — PHENYLEPHRINE 40 MCG/ML (10ML) SYRINGE FOR IV PUSH (FOR BLOOD PRESSURE SUPPORT)
PREFILLED_SYRINGE | INTRAVENOUS | Status: AC
  Filled 2014-07-13: qty 10

## 2014-07-13 MED ORDER — HYDROMORPHONE HCL 1 MG/ML IJ SOLN
INTRAMUSCULAR | Status: DC | PRN
  Administered 2014-07-13 (×4): 0.5 mg via INTRAVENOUS

## 2014-07-13 MED ORDER — SODIUM CHLORIDE 0.9 % IV SOLN
Freq: Once | INTRAVENOUS | Status: AC
  Administered 2014-07-13: 16:00:00 via INTRAVENOUS

## 2014-07-13 MED ORDER — CEFAZOLIN SODIUM-DEXTROSE 2-3 GM-% IV SOLR
2.0000 g | INTRAVENOUS | Status: AC
  Administered 2014-07-13 (×2): 2 g via INTRAVENOUS

## 2014-07-13 MED ORDER — SODIUM CHLORIDE 0.9 % IJ SOLN
INTRAMUSCULAR | Status: AC
  Filled 2014-07-13: qty 10

## 2014-07-13 MED ORDER — ACETAMINOPHEN 10 MG/ML IV SOLN
1000.0000 mg | Freq: Once | INTRAVENOUS | Status: AC
  Administered 2014-07-13: 1000 mg via INTRAVENOUS
  Filled 2014-07-13: qty 100

## 2014-07-13 MED ORDER — CEFAZOLIN SODIUM-DEXTROSE 2-3 GM-% IV SOLR
INTRAVENOUS | Status: AC
  Filled 2014-07-13: qty 50

## 2014-07-13 MED ORDER — MIDAZOLAM HCL 2 MG/2ML IJ SOLN
INTRAMUSCULAR | Status: AC
  Filled 2014-07-13: qty 2

## 2014-07-13 MED ORDER — SODIUM BICARBONATE 4 % IV SOLN
INTRAVENOUS | Status: AC
  Filled 2014-07-13: qty 10

## 2014-07-13 MED ORDER — SODIUM CHLORIDE 0.9 % IV SOLN
INTRAVENOUS | Status: DC | PRN
Start: 2014-07-13 — End: 2014-07-13
  Administered 2014-07-13: 12:00:00 via INTRAVENOUS

## 2014-07-13 MED ORDER — LIDOCAINE HCL (CARDIAC) 20 MG/ML IV SOLN
INTRAVENOUS | Status: AC
  Filled 2014-07-13: qty 5

## 2014-07-13 MED ORDER — LACTATED RINGERS IV SOLN
INTRAVENOUS | Status: DC
  Administered 2014-07-13: 17:00:00 via INTRAVENOUS

## 2014-07-13 MED ORDER — SODIUM CHLORIDE 0.9 % IV SOLN
INTRAVENOUS | Status: DC
  Administered 2014-07-13 – 2014-07-17 (×3): via INTRAVENOUS
  Administered 2014-07-18: 1000 mL via INTRAVENOUS
  Administered 2014-07-19 – 2014-07-21 (×2): via INTRAVENOUS

## 2014-07-13 MED ORDER — FENTANYL CITRATE 0.05 MG/ML IJ SOLN: 50.0000 ug | Freq: Once | INTRAMUSCULAR | Status: DC

## 2014-07-13 MED ORDER — LIDOCAINE HCL (CARDIAC) 20 MG/ML IV SOLN
INTRAVENOUS | Status: DC | PRN
  Administered 2014-07-13: 100 mg via INTRAVENOUS

## 2014-07-13 MED ORDER — MIDAZOLAM HCL 5 MG/5ML IJ SOLN
INTRAMUSCULAR | Status: DC | PRN
  Administered 2014-07-13: 2 mg via INTRAVENOUS

## 2014-07-13 MED ORDER — PROPOFOL 10 MG/ML IV BOLUS
INTRAVENOUS | Status: DC | PRN
  Administered 2014-07-13: 200 mg via INTRAVENOUS

## 2014-07-13 MED ORDER — CALCIUM CHLORIDE 10 % IV SOLN
INTRAVENOUS | Status: AC
  Filled 2014-07-13: qty 10

## 2014-07-13 MED ORDER — FENTANYL CITRATE 0.05 MG/ML IJ SOLN
INTRAMUSCULAR | Status: DC | PRN
  Administered 2014-07-13: 100 ug via INTRAVENOUS
  Administered 2014-07-13 (×3): 50 ug via INTRAVENOUS

## 2014-07-13 MED ORDER — CEFAZOLIN SODIUM 1-5 GM-% IV SOLN
1.0000 g | Freq: Four times a day (QID) | INTRAVENOUS | Status: AC
  Administered 2014-07-13 – 2014-07-14 (×3): 1 g via INTRAVENOUS
  Filled 2014-07-13 (×5): qty 50

## 2014-07-13 MED ORDER — FENTANYL CITRATE 0.05 MG/ML IJ SOLN
INTRAMUSCULAR | Status: AC
  Filled 2014-07-13: qty 5

## 2014-07-13 MED ORDER — SODIUM BICARBONATE 8.4 % IV SOLN
INTRAVENOUS | Status: AC
  Filled 2014-07-13: qty 50

## 2014-07-13 MED ORDER — FENTANYL CITRATE (PF) 2500 MCG/50ML IJ SOLN
25.0000 ug/h | INTRAMUSCULAR | Status: DC
  Administered 2014-07-13: 50 ug/h via INTRAVENOUS
  Administered 2014-07-14: 275 ug/h via INTRAVENOUS
  Filled 2014-07-13 (×2): qty 50

## 2014-07-13 MED ORDER — HYDROMORPHONE HCL 2 MG/ML IJ SOLN
INTRAMUSCULAR | Status: AC
  Filled 2014-07-13: qty 1

## 2014-07-13 MED ORDER — ALBUMIN HUMAN 5 % IV SOLN
INTRAVENOUS | Status: DC | PRN
  Administered 2014-07-13 (×3): via INTRAVENOUS

## 2014-07-13 MED ORDER — PANTOPRAZOLE SODIUM 40 MG IV SOLR
40.0000 mg | Freq: Every day | INTRAVENOUS | Status: DC
  Administered 2014-07-13 – 2014-07-17 (×5): 40 mg via INTRAVENOUS
  Filled 2014-07-13 (×6): qty 40

## 2014-07-13 MED ORDER — LACTATED RINGERS IV SOLN
INTRAVENOUS | Status: DC | PRN
Start: 2014-07-13 — End: 2014-07-13
  Administered 2014-07-13: 07:00:00 via INTRAVENOUS

## 2014-07-13 SURGICAL SUPPLY — 105 items
BLADE EXTENDED COATED 6.5IN (ELECTRODE) ×2 IMPLANT
BLADE HEX COATED 2.75 (ELECTRODE) ×2 IMPLANT
BOOT SUTURE VASCULAR YLW (MISCELLANEOUS)
CABLE HIGH FREQUENCY MONO STRZ (ELECTRODE) IMPLANT
CATH FOLEY 2WAY SLVR  5CC 16FR (CATHETERS)
CATH FOLEY 2WAY SLVR 5CC 16FR (CATHETERS) IMPLANT
CATH KIT ON-Q SILVERSOAK 7.5IN (CATHETERS) IMPLANT
CATH ROBINSON RED A/P 12FR (CATHETERS) IMPLANT
CATH ROBINSON RED A/P 14FR (CATHETERS) IMPLANT
CATH ROBINSON RED A/P 16FR (CATHETERS) IMPLANT
CATH ROBINSON RED A/P 18FR (CATHETERS) IMPLANT
CATH ROBINSON RED A/P 20FR (CATHETERS) IMPLANT
CHLORAPREP W/TINT 26ML (MISCELLANEOUS) ×2 IMPLANT
CLAMP SUTURE YELLOW 5 PAIRS (MISCELLANEOUS) IMPLANT
CLIP LIGATING HEM O LOK PURPLE (MISCELLANEOUS) ×4 IMPLANT
CLIP LIGATING HEMO O LOK GREEN (MISCELLANEOUS) ×4 IMPLANT
CLIP LIGATING HEMOLOK MED (MISCELLANEOUS) IMPLANT
CLIP TI LARGE 6 (CLIP) ×18 IMPLANT
CLIP TI MEDIUM 6 (CLIP) ×4 IMPLANT
CUTTER FLEX LINEAR 45M (STAPLE) ×4 IMPLANT
DECANTER SPIKE VIAL GLASS SM (MISCELLANEOUS) ×2 IMPLANT
DISSECTOR ROUND CHERRY 3/8 STR (MISCELLANEOUS) IMPLANT
DRAIN CHANNEL 19F RND (DRAIN) ×2 IMPLANT
DRAIN PENROSE 18X1/2 LTX STRL (DRAIN) ×2 IMPLANT
DRAPE C-ARM 42X120 X-RAY (DRAPES) IMPLANT
DRAPE CAMERA CLOSED 9X96 (DRAPES) ×2 IMPLANT
DRAPE UTILITY XL STRL (DRAPES) IMPLANT
DRAPE WARM FLUID 44X44 (DRAPE) ×2 IMPLANT
DRESSING TELFA ISLAND 4X8 (GAUZE/BANDAGES/DRESSINGS) IMPLANT
DRSG OPSITE POSTOP 4X12 (GAUZE/BANDAGES/DRESSINGS) ×2 IMPLANT
DRSG PAD ABDOMINAL 8X10 ST (GAUZE/BANDAGES/DRESSINGS) IMPLANT
DRSG TELFA 4X10 ISLAND STR (GAUZE/BANDAGES/DRESSINGS) ×2 IMPLANT
DRSG TELFA PLUS 4X6 ADH ISLAND (GAUZE/BANDAGES/DRESSINGS) IMPLANT
ENDOLOOP SUT PDS II  0 18 (SUTURE)
ENDOLOOP SUT PDS II 0 18 (SUTURE) IMPLANT
EVACUATOR SILICONE 100CC (DRAIN) ×2 IMPLANT
GAUZE SPONGE 4X4 12PLY STRL (GAUZE/BANDAGES/DRESSINGS) IMPLANT
GLOVE BIO SURGEON STRL SZ 6 (GLOVE) ×2 IMPLANT
GLOVE INDICATOR 6.5 STRL GRN (GLOVE) ×2 IMPLANT
GOWN STRL REUS W/TWL 2XL LVL3 (GOWN DISPOSABLE) ×4 IMPLANT
GOWN STRL REUS W/TWL XL LVL3 (GOWN DISPOSABLE) ×6 IMPLANT
HEMOSTAT SNOW SURGICEL 2X4 (HEMOSTASIS) ×8 IMPLANT
HEMOSTAT SURGICEL 4X8 (HEMOSTASIS) IMPLANT
KIT BASIN OR (CUSTOM PROCEDURE TRAY) ×2 IMPLANT
LOOP MINI RED (MISCELLANEOUS) IMPLANT
LOOP VESSEL MAXI BLUE (MISCELLANEOUS) IMPLANT
MARKER SKIN DUAL TIP RULER LAB (MISCELLANEOUS) ×2 IMPLANT
NEEDLE BIOPSY 14GX4.5 SOFT TIS (NEEDLE) IMPLANT
NEEDLE HYPO 22GX1.5 SAFETY (NEEDLE) ×2 IMPLANT
PACK GENERAL/GYN (CUSTOM PROCEDURE TRAY) ×2 IMPLANT
PEN SKIN MARKING BROAD (MISCELLANEOUS) ×2 IMPLANT
PLUG CATH AND CAP STER (CATHETERS) IMPLANT
RELOAD 45 VASCULAR/THIN (ENDOMECHANICALS) ×6 IMPLANT
RELOAD PROXIMATE 75MM BLUE (ENDOMECHANICALS) IMPLANT
SEPRAFILM PROCEDURAL PACK 3X5 (MISCELLANEOUS) IMPLANT
SHEARS FOC LG CVD HARMONIC 17C (MISCELLANEOUS) ×2 IMPLANT
SLEEVE SURGEON STRL (DRAPES) IMPLANT
SLEEVE XCEL OPT CAN 5 100 (ENDOMECHANICALS) ×2 IMPLANT
SPONGE DRAIN TRACH 4X4 STRL 2S (GAUZE/BANDAGES/DRESSINGS) IMPLANT
SPONGE LAP 18X18 X RAY DECT (DISPOSABLE) ×26 IMPLANT
STAPLER PROXIMATE 75MM BLUE (STAPLE) IMPLANT
STAPLER VISISTAT 35W (STAPLE) IMPLANT
SUCTION POOLE TIP (SUCTIONS) ×4 IMPLANT
SUT 5.0 PDS RB-1 (SUTURE)
SUT CHROMIC 3 0 SH 27 (SUTURE) IMPLANT
SUT CHROMIC 4 0 RB 1X27 (SUTURE) IMPLANT
SUT ETHILON 1 TP 1 60 (SUTURE) IMPLANT
SUT ETHILON 2 0 PS N (SUTURE) ×2 IMPLANT
SUT MNCRL AB 4-0 PS2 18 (SUTURE) IMPLANT
SUT PDS AB 1 TP1 54 (SUTURE) IMPLANT
SUT PDS AB 1 TP1 96 (SUTURE) ×6 IMPLANT
SUT PDS AB 3-0 SH 27 (SUTURE) IMPLANT
SUT PDS AB 4-0 RB1 27 (SUTURE) ×8 IMPLANT
SUT PDS PLUS AB 5-0 RB-1 (SUTURE) IMPLANT
SUT PROLENE 3 0 SH 48 (SUTURE) ×4 IMPLANT
SUT PROLENE 3 0 SH1 36 (SUTURE) ×6 IMPLANT
SUT PROLENE 4 0 RB 1 (SUTURE) ×2
SUT PROLENE 4-0 RB1 .5 CRCL 36 (SUTURE) ×2 IMPLANT
SUT PROLENE 5 0 CC 1 (SUTURE) IMPLANT
SUT SILK 0 FSL (SUTURE) ×2 IMPLANT
SUT SILK 2 0 (SUTURE) ×1
SUT SILK 2 0 SH CR/8 (SUTURE) ×2 IMPLANT
SUT SILK 2-0 18XBRD TIE 12 (SUTURE) ×1 IMPLANT
SUT SILK 3 0 (SUTURE)
SUT SILK 3 0 SH CR/8 (SUTURE) IMPLANT
SUT SILK 3-0 18XBRD TIE 12 (SUTURE) IMPLANT
SUT VIC AB 3-0 SH 18 (SUTURE) IMPLANT
SUT VIC AB 3-0 SH 27 (SUTURE) ×5
SUT VIC AB 3-0 SH 27X BRD (SUTURE) ×2 IMPLANT
SUT VIC AB 3-0 SH 27XBRD (SUTURE) ×3 IMPLANT
SUT VIC AB 4-0 SH 18 (SUTURE) IMPLANT
SUT VICRYL 2 0 18  UND BR (SUTURE) ×1
SUT VICRYL 2 0 18 UND BR (SUTURE) ×1 IMPLANT
SUT VICRYL 3 0 BR 18  UND (SUTURE) ×1
SUT VICRYL 3 0 BR 18 UND (SUTURE) ×1 IMPLANT
SYR 20CC LL (SYRINGE) IMPLANT
TAG SUTURE CLAMP YLW 5PR (MISCELLANEOUS)
TAPE UMBILICAL COTTON 1/8X30 (MISCELLANEOUS) IMPLANT
TOWEL OR 17X26 10 PK STRL BLUE (TOWEL DISPOSABLE) ×4 IMPLANT
TOWEL OR NON WOVEN STRL DISP B (DISPOSABLE) ×4 IMPLANT
TRAY FOLEY W/METER SILVER 14FR (SET/KITS/TRAYS/PACK) ×2 IMPLANT
TUBE FEEDING 5FR 36IN KANGAROO (TUBING) IMPLANT
TUBE FEEDING 8FR 16IN STR KANG (MISCELLANEOUS) IMPLANT
TUBING INSUFFLATION 10FT LAP (TUBING) ×2 IMPLANT
TUNNELER SHEATH ON-Q 16GX12 DP (PAIN MANAGEMENT) IMPLANT

## 2014-07-13 NOTE — Op Note (Addendum)
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  POST-OPERATIVE DIAGNOSIS:  Same  PROCEDURE:  Procedure(s): Resection of large 12 cm malignant retroperitoneal tumor en bloc with left nephrectomy, mobilization of splenic flexure  SURGEON:  Surgeon(s): Stark Klein, MD  ASSISTANT:   Neysa Bonito, MD Ariel Hilsinger, PA-S  ANESTHESIA:   general  DRAINS: (66 Fr) Blake drain(s) in the LUQ behind the pancreas.   LOCAL MEDICATIONS USED:  NONE  SPECIMEN: 12 cm left retroperitoneal mass en bloc with kidney  DISPOSITION OF SPECIMEN:  PATHOLOGY  COUNTS:  YES  DICTATION: .Dragon Dictation  PLAN OF CARE: Admit to inpatient   PATIENT DISPOSITION:  ICU - intubated and critically ill.  FINDINGS:  Large 12 cmfirm midline mass with extension around the renal hilum, behind the pancreas, and posterior to the spine  EBL: 2000 mL  PROCEDURE:  Patient was identified in the holding area and taken to the operating room where he was placed supine on the operating table. General endotracheal anesthesia was induced. Foley catheter was placed. Patient's abdomen was prepped and draped in sterile fashion. A timeout was performed according to the surgical safety checklist. When all was correct, we continued. A midline incision was made from just below the xiphoid to just below the umbilicus with a #41 blade. The cautery was used to divide the subcutaneous tissues. The fascia was entered in the midline with the cautery as well and the fascial incision carried out the length of the skin incision. The peritoneum was entered sharply in the epigastric region and the fascial incision was opened all the way through to the length of the skin incision. The Bookwalter retractor was used to assist with visualization.   The lesser sac was entered by taking the omentum off of the colon and retracting superiorly. Mass was identified in the left upper quadrant. The superior aspect of the  mass was behind the pancreas. The inferior aspect was below the ligament of Treitz. The splenic flexure was mobilized and retracted inferiorly and medially. The spleen was left in place on its attachments. The pancreas was taken off the retroperitoneum and the splenic vein and artery identified and retracted superiorly. This was done gently so as to minimize trauma to the pancreas.   The harmonic scalpel and a combination of blunt and sharp dissection were used to take the superior aspect of the mass off of the retroperitoneum. The, we tried to get the inferior aspect of the mass off the kidney.  I tried to mobilize the IMV medially, in order to salvage it. However, it became apparent that this was completely adherent to the mass. A portion of the IMV was taken on block with the mass. The specimen was attempted to be skeletonized at the superior border of the kidney, but it was adherent to the kidney and was wrapped around the renal vessels. A decision was made not to save the kidney. The kidney was mobilized from lateral to medial. There were multiple lymph nodes that were present around the mass and wrapping around the back behind the aorta. The dissection was then taken down directly on the aorta. The inferiorly, the ureter was clipped and tied. The area was extremely fibrotic and inflamed. This was taken just a little but at a time. The patient did start to become relatively oozy.  The left upper quadrant was reexamined for any specific bleeding. There was a small area off of the omentum that was bleeding and this was suture ligated.  The adrenal was taken en bloc with the mass.  Eventually, the diaphragmatic attachments and the psoas attachments of the lymphatics were taken with the harmonic. The renal artery and vein were stapled with the Endo GIA stapler. Several loads were also used on the psoas attachments as well.  Once the mass was finally removed, the left upper quadrant was packed. The patient did  require significant transfusion during this time. The left upper quadrant was then systematically examined for any signs of bleeding. The staple line on the aorta had a small amount of bleeding and this was ligated with 3-0 Prolene. There were several areas on the psoas that also were cauterized. The diaphragm required suture ligation at the lowest medial attachment. The perinephric fat had several areas of bleeding that were clipped. There was a small vessel bleeding on the back of the splenic vein that was ligated. The defect in the mesocolon that had been made with taking a portion of the IMV was closed using 3-0 running Vicryl suture. The left upper quadrant was repacked and pressure was held on the psoas and diaphragmatic regions for approximately 5 minutes. This was all reexamined and stasis was achieved with combination of sutures and cautery. Surgicel was placed into the left upper quadrant. Anesthesia was allowed to catch up.  At this point, the left upper quadrant was reexamined. The lap count was performed. There was no overt signs of bleeding at that time. The patient's blood pressure was approximately 100/60. There was no evidence of continued bleeding on systematic examination of the spleen, omentum, pancreas, perinephric fat, aorta, diaphragm, and other surrounding tissue. A drain was placed in the left upper quadrant passing behind the pancreas. The fascia was then closed using running #1 looped PDS sutures.  On-Q tunneler catheters were placed on both sides of the fascial incision and the preperitoneal space. The skin was irrigated and closed with skin staples.  The On-Q catheters were advanced to the toes where she is at home where she is were removed. The wounds were cleaned, dried, and dressed with dry sterile dressings.  The patient remained in the OR to complete some of his transfusion. Dr. Oletta Lamas stated the patient until she was satisfied that he was stable and was transferred to the ICU in  stable condition with the neo-weaned to off and the blood pressure approximately 130/70.

## 2014-07-13 NOTE — Transfer of Care (Signed)
Immediate Anesthesia Transfer of Care Note  Patient: Darryl Griffith  Procedure(s) Performed: Procedure(s): RESECTION OF left RETROPERITONEAL MASS (Left)  Patient Location: ICU  Anesthesia Type:General  Level of Consciousness: unresponsive and Patient remains intubated per anesthesia plan  Airway & Oxygen Therapy: Patient remains intubated per anesthesia plan  Post-op Assessment: Report given to RN and Post -op Vital signs reviewed and stable  Post vital signs: Reviewed and stable  Last Vitals:  Filed Vitals:   07/13/14 0542  BP: 119/65  Pulse: 81  Temp: 36.7 C  Resp: 18    Complications: No apparent anesthesia complications

## 2014-07-13 NOTE — Anesthesia Preprocedure Evaluation (Signed)
Anesthesia Evaluation  Patient identified by MRN, date of birth, ID band Patient awake    Reviewed: Allergy & Precautions, NPO status , Patient's Chart, lab work & pertinent test results  Airway Mallampati: II   Neck ROM: Full    Dental   Pulmonary neg pulmonary ROS,  breath sounds clear to auscultation        Cardiovascular hypertension, + angina Rhythm:Regular Rate:Normal     Neuro/Psych    GI/Hepatic Neg liver ROS, History noted. CE   Endo/Other    Renal/GU negative Renal ROS     Musculoskeletal   Abdominal   Peds  Hematology   Anesthesia Other Findings   Reproductive/Obstetrics                             Anesthesia Physical Anesthesia Plan  ASA: III  Anesthesia Plan: General   Post-op Pain Management:    Induction: Intravenous  Airway Management Planned: Oral ETT  Additional Equipment:   Intra-op Plan:   Post-operative Plan:   Informed Consent: I have reviewed the patients History and Physical, chart, labs and discussed the procedure including the risks, benefits and alternatives for the proposed anesthesia with the patient or authorized representative who has indicated his/her understanding and acceptance.     Plan Discussed with: CRNA and Anesthesiologist  Anesthesia Plan Comments:         Anesthesia Quick Evaluation

## 2014-07-13 NOTE — Anesthesia Procedure Notes (Addendum)
Procedure Name: Intubation Date/Time: 07/13/2014 7:45 AM Performed by: Carleene Cooper A Pre-anesthesia Checklist: Patient identified, Timeout performed, Emergency Drugs available, Suction available and Patient being monitored Patient Re-evaluated:Patient Re-evaluated prior to inductionOxygen Delivery Method: Circle system utilized Preoxygenation: Pre-oxygenation with 100% oxygen Intubation Type: IV induction Ventilation: Mask ventilation without difficulty and Oral airway inserted - appropriate to patient size Laryngoscope Size: Mac and 4 Grade View: Grade I Tube type: Oral Tube size: 7.5 mm Number of attempts: 1 Airway Equipment and Method: Stylet Placement Confirmation: ETT inserted through vocal cords under direct vision,  breath sounds checked- equal and bilateral and positive ETCO2 Secured at: 22 cm Tube secured with: Tape Dental Injury: Teeth and Oropharynx as per pre-operative assessment     RIJ CVP Dual Lumen: [id number]: The patient was identified and consent obtained.  TO was performed, and full barrier precautions were used.  The skin was anesthetized with lidocaine.  Once the vein was located with the 22 ga. needle using ultrasound guidance , the wire was inserted into the vein.  The wire location was confirmed with ultrasound.  The tissue was dilated and the catheter was carefully inserted, then sutured in place. A dressing was applied. The patient tolerated the procedure well.

## 2014-07-13 NOTE — Consult Note (Signed)
PULMONARY / CRITICAL CARE MEDICINE   Name: Darryl Griffith MRN: [id number] DOB: 01-27-1946    ADMISSION DATE:  07/13/2014 CONSULTATION DATE:  07/13/2014  REFERRING MD :  Barry Dienes  CHIEF COMPLAINT:  Abdominal mass  INITIAL PRESENTATION: 69 yo male L retroperitoneal tumor involving renal arteries. Requiring L nephrectomy and mobilization of pancreas to resect. Complicated by massive blood loss. Hasc. On ventilator and to ICU post operatively. PCCM to see.  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  SIGNIFICANT EVENTS:   HISTORY OF PRESENT ILLNESS:  69 year old male with PMH as below, which includes HTN, HLD, and angina. Several months ago he began complaining of back and flank pain affecting sleep, which prompted him to seek evaluation. CT showed large retroperitoneal mass. 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 PET was negative for metastasis. It was deemed that this would require surgical intervention and he presented 4/14 for this. Tumor was found to involve renal arteries and removal required left nephrectomy and mobilization of pancreas. Also had massive blood loss.  Received 14 units, PRBC, FFP, Platelets and Cryoprecipitate in OR. He remains of ventilatory post-operatively requiring ICU admission of PCCM consultation.    PAST MEDICAL HISTORY :   has a past medical history of Hypertension; Elevated cholesterol; and Anginal pain.  has past surgical history that includes surgery for arm fracture (Right, 1952); Knee arthroscopy (Right, 1970's); Colonoscopy with propofol (N/A, 10/20/2013); Hot hemostasis (N/A, 10/20/2013); and tigger finger release. Prior to Admission medications   Medication Sig Start Date End Date Taking? Authorizing Provider  aspirin EC 81 MG tablet Take 81 mg by mouth at bedtime.   Yes Historical Provider, MD  Coenzyme Q10 (COQ10) 100 MG CAPS Take 100-200 mg by  mouth 2 (two) times daily. Take 1 tablet (100 mg) every morning and 2 tablets (200 mg) every night   Yes Historical Provider, MD  fexofenadine (ALLEGRA) 180 MG tablet Take 180 mg by mouth every evening.    Yes Historical Provider, MD  Ginkgo Biloba (GNP GINGKO BILOBA EXTRACT PO) Take 120 mg by mouth 2 (two) times daily.   Yes Historical Provider, MD  ibuprofen (ADVIL,MOTRIN) 200 MG tablet Take 400-600 mg by mouth every 6 (six) hours as needed for headache or moderate pain.   Yes Historical Provider, MD  losartan-hydrochlorothiazide (HYZAAR) 100-25 MG per tablet Take 1 tablet by mouth every morning.    Yes Historical Provider, MD  Multiple Minerals-Vitamins (CALCIUM CITRATE PLUS/MAGNESIUM) TABS Take 1 tablet by mouth every morning. Calcium citrate 500 mg, magnesium 800 mg, zinc 10 mg   Yes Historical Provider, MD  Multiple Vitamin (MULTIVITAMIN WITH MINERALS) TABS tablet Take 1 tablet by mouth every morning.    Yes Historical Provider, MD  Omega-3 Fatty Acids (FISH OIL) 1200 MG CAPS Take 2,400 mg by mouth 2 (two) times daily.   Yes Historical Provider, MD  OVER THE COUNTER MEDICATION Take 1 tablet by mouth at bedtime. Beta Sitosterol 375 mg   Yes Historical Provider, MD  OVER THE COUNTER MEDICATION Take 400-800 mg by mouth 2 (two) times daily. Bio Astin: 2 tablets (800 mg) every morning and 1 tablet (400 mg) every night   Yes Historical Provider, MD  simvastatin (ZOCOR) 40 MG tablet Take 40 mg by mouth at bedtime.    Yes Historical Provider, MD  traZODone (DESYREL) 50 MG tablet Take 50 mg by mouth at bedtime.    Yes Historical Provider, MD  vitamin C (ASCORBIC ACID) 500 MG tablet Take 500 mg by mouth every morning.    Yes Historical Provider, MD  vitamin E 400 UNIT capsule Take 400 Units by mouth every morning.    Yes Historical Provider, MD   No Known Allergies  FAMILY HISTORY:  is adopted. SOCIAL HISTORY:  reports that he has never smoked. He has never used smokeless tobacco. He reports that he  drinks alcohol. He reports that he does not use illicit drugs.  REVIEW OF SYSTEMS:  unable  SUBJECTIVE:   VITAL SIGNS: Temp:  [93.6 F (34.2 C)-98 F (36.7 C)] 97.7 F (36.5 C) (04/14 2115) Pulse Rate:  [81-116] 93 (04/14 2115) Resp:  [15-27] 17 (04/14 2115) BP: (119-128)/(62-65) 128/62 mmHg (04/14 1929) SpO2:  [98 %-100 %] 100 % (04/14 2115) Arterial Line BP: (78-163)/(51-87) 108/63 mmHg (04/14 2115) FiO2 (%):  [30 %-100 %] 30 % (04/14 2115) Weight:  [95.822 kg (211 lb 4 oz)] 95.822 kg (211 lb 4 oz) (04/14 0614) HEMODYNAMICS: CVP:  [9 mmHg] 9 mmHg VENTILATOR SETTINGS: Vent Mode:  [-] PRVC FiO2 (%):  [30 %-100 %] 30 % Set Rate:  [15 bmp-20 bmp] 20 bmp Vt Set:  [550 mL] 550 mL PEEP:  [5 cmH20] 5 cmH20 Plateau Pressure:  [14 cmH20] 14 cmH20 INTAKE / OUTPUT:  Intake/Output Summary (Last 24 hours) at 07/13/14 2127 Last data filed at 07/13/14 2115  Gross per 24 hour  Intake 22990.42 ml  Output   7275 ml  Net 15715.42 ml    PHYSICAL EXAMINATION: General:  Obese male awake and comfortable on vent Neuro: Alert, oriented, non-focal HEENT:  [place]/AT, PERRL, no JVD, ETT in place Cardiovascular:  RRR, no MRG Lungs:  Distant breath sounds.  Abdomen:  Soft, non-tender, surgical dressings, drains in place Musculoskeletal:  No acute deformity or ROM limitation Skin: Grossly intact, no edema  LABS:  CBC  Recent Labs Lab 07/13/14 1138 07/13/14 1433 07/13/14 1435 07/13/14 1939  WBC  --  4.7  --  6.7  HGB 8.8* 6.7*  --  7.2*  HCT 26.0* 20.0*  --  20.9*  PLT  --  57* 54* 46*   Coag's  Recent Labs Lab 07/13/14 1435 07/13/14 1800 07/13/14 1939  APTT 74* 40*  --   INR 2.54* 1.42 1.42   BMET  Recent Labs Lab 07/13/14 1138  NA 136  K 4.0   Electrolytes No results for input(s): CALCIUM, MG, PHOS in the last 168 hours. Sepsis Markers No results for input(s): LATICACIDVEN, PROCALCITON, O2SATVEN in the last 168 hours. ABG  Recent Labs Lab 07/13/14 1645  07/13/14 1945  PHART 7.212* 7.412  PCO2ART 52.7* 38.2  PO2ART 476.0* 209.0*   Liver Enzymes No results for input(s): AST, ALT, ALKPHOS, BILITOT, ALBUMIN in the last 168 hours. Cardiac Enzymes No results for input(s): TROPONINI, PROBNP in the last 168 hours. Glucose No results for input(s): GLUCAP in the last 168 hours.  Imaging No results found.   ASSESSMENT / PLAN:  PULMONARY OETT 4/14 >  A: Acute respiratory failure in post-operative setting  P:   Full vent support CXR in AM Follow ABG VAP bundle Should be able to extubate in AM if no further surgery planned  CARDIOVASCULAR RIJ CVL 4/14 > R Rad art line 4/14 > A:  Shock due to intraoperative hemorrhage Prolonged QT H/o HTN, HLD  P:  Telemtry montoring MAP goal > 50mm/Hg Blood product administration see heme section Serial CBC  Phenylephrine to maintain MAP goals Holding home  simvastatin, losartan/HCTZ, ASA for now  RENAL A:   No acute issues  P:   Repeat Bmet Replace electrolytes as indicated  GASTROINTESTINAL JP drain 4/14 > A:   S/p retroperitoneal mass removal  P:   NPO Management per surgery NGT to low intermittent suction Protonix for SUP  HEMATOLOGIC A:   Acute blood loss anemia Thrombocytopenia Fibrinogen low  P:  CBC q 4 hours FFP and Plt from OR currently infusing Transfuse per ICU guidelines  INFECTIOUS A:   Surgical prophylaxis  P:   Ancef per surgery Follow WBC and fever curve  ENDOCRINE A:   No acute issues  P:   Follow glucose on chemistry  NEUROLOGIC A:   Pain management  P:   RASS goal:0/1 Fentanyl gtt for pain control PRN versed for sedation   FAMILY  - Updates:   - Inter-disciplinary family meet or Palliative Care meeting due by:  07/20/2014   Georgann Housekeeper, AGACNP-BC [place] [facility]/Critical Care Pager [phone] or [phone]  07/13/2014 9:27 PM   Attending:  I have seen and examined the patient with nurse  practitioner/resident and agree with the note above.   Mr. Mestas chart was reviewed, he has a large retroperitoneal mass which was resected today leading to large volume blood loss.    On exam: he is awake, alert, on the vent, lungs clear, mild scleredema of eyes, CV exam WNL, no bowel sounds, trace edema  Massive blood loss > remains anemic, will monitor Hgb, give another 2 U FFP, monitor calcium, transfuse for Hgb goal > 7gm/dL Acute respiratory failure> due to sedation, will maintain on ventilator overnight, suspect he will need lasix but will hold off until we see renal function and UOP, likely extubation 4/15  Daughter (nurse practitioner) updated bedside  My cc time 45 minutes  Roselie Awkward, MD [place] [facility] Pager: [phone] Cell: [phone] If no response, call [phone]

## 2014-07-13 NOTE — Interval H&P Note (Signed)
History and Physical Interval Note:  07/13/2014 7:32 AM  Darryl Griffith  has presented today for surgery, with the diagnosis of left malignant retroperitoneal mass  The various methods of treatment have been discussed with the patient and family. After consideration of risks, benefits and other options for treatment, the patient has consented to  Procedure(s): RESECTION OF left RETROPERITONEAL MASS, possible left nephrectomy (Left) as a surgical intervention .  The patient's history has been reviewed, patient examined, no change in status, stable for surgery.  I have reviewed the patient's chart and labs.  Questions were answered to the patient's satisfaction.     Delane Stalling

## 2014-07-13 NOTE — Progress Notes (Signed)
CRITICAL VALUE ALERT  Critical value received:  Hgb 6.0  Date of notification:  07/13/2014  Time of notification:  2350  Critical value read back:Yes.    Nurse who received alert:  R. Rosana Hoes  MD notified (1st page):  E Link  Time of first page:  2357  MD notified (2nd page):  Time of second page:  Responding MD:    Time MD responded:  2257

## 2014-07-13 NOTE — Anesthesia Postprocedure Evaluation (Signed)
  Anesthesia Post-op Note  Patient: Darryl Griffith  Procedure(s) Performed: Procedure(s): RESECTION OF left RETROPERITONEAL MASS (Left)  Patient Location: PACU and ICU  Anesthesia Type:General  Level of Consciousness: sedated  Airway and Oxygen Therapy: Patient remains intubated per anesthesia plan  Post-op Pain: mild  Post-op Assessment: Post-op Vital signs reviewed  Post-op Vital Signs: Reviewed  Last Vitals:  Filed Vitals:   07/13/14 1615  BP:   Pulse: 113  Temp: 34.2 C  Resp: 15    Complications: No apparent anesthesia complications

## 2014-07-13 NOTE — Progress Notes (Signed)
   07/13/14 1600  Clinical Encounter Type  Visited With Family  Visit Type Initial  Referral From Other (Comment)  Consult/Referral To Nurse  Recommendations follow for continued support    Chaplain provided support with family in ICU waiting as pt was transitioning to ICU.

## 2014-07-13 NOTE — Progress Notes (Addendum)
Pt came from OR intubated. RT placed pt on vent in ICU per MD order.

## 2014-07-13 NOTE — Progress Notes (Signed)
[facility] Progress Note Patient Name: Darryl Griffith DOB: 1945/05/20 MRN: [id number]   Date of Service  07/13/2014  HPI/Events of Note  Had massive bleeding hgb 6  eICU Interventions  2 units prbc     Intervention Category Major Interventions: Hypovolemia - evaluation and treatment with fluids  Raylene Miyamoto. 07/13/2014, 11:58 PM

## 2014-07-13 NOTE — Addendum Note (Signed)
Addendum  created 07/13/14 1815 by Sharlette Dense, CRNA   Modules edited: Charges VN

## 2014-07-13 NOTE — Progress Notes (Signed)
[facility] Physician-Brief Progress Note Patient Name: Darryl Griffith DOB: Jan 20, 1946 MRN: [id number]   Date of Service  07/13/2014  HPI/Events of Note  69 yo male L retroperitoneal tumor involving renal arteries. Requiring L nephrectomy and mobilization of pancreas to resect. Complicated by massive blood loss. Has received 14 units, PRBC, FFP, Platelets and Cryoprecipitate. On mechanical ventilation. PCCM has been formally consulted now.   eICU Interventions  Orders written to temporize patient until the bedside team can do a formal evaluation.      Intervention Category Evaluation Type: New Patient Evaluation  Lysle Dingwall 07/13/2014, 4:57 PM

## 2014-07-13 NOTE — Progress Notes (Signed)
[facility] Progress Note Patient Name: Darryl Griffith DOB: 1946/03/20 MRN: [id number]   Date of Service  07/13/2014  HPI/Events of Note  ABG on 100%/PRVC 15/TV 550/P 5 = 7.21/52/475/20.4.  eICU Interventions  Will increase rate to 20 and recheck ABG at 7 PM.     Intervention Category Major Interventions: Respiratory failure - evaluation and management  Falyn Rubel [place] 07/13/2014, 5:07 PM

## 2014-07-14 DIAGNOSIS — R579 Shock, unspecified: Secondary | ICD-10-CM

## 2014-07-14 DIAGNOSIS — J95821 Acute postprocedural respiratory failure: Secondary | ICD-10-CM

## 2014-07-14 DIAGNOSIS — D65 Disseminated intravascular coagulation [defibrination syndrome]: Secondary | ICD-10-CM

## 2014-07-14 LAB — CBC
HCT: 22.8 % — ABNORMAL LOW (ref 39.0–52.0)
HCT: 21.6 % — ABNORMAL LOW (ref 39.0–52.0)
HEMATOCRIT: 21.9 % — AB (ref 39.0–52.0)
HEMOGLOBIN: 7.6 g/dL — AB (ref 13.0–17.0)
Hemoglobin: 7.9 g/dL — ABNORMAL LOW (ref 13.0–17.0)
Hemoglobin: 7.6 g/dL — ABNORMAL LOW (ref 13.0–17.0)
MCH: 29.9 pg (ref 26.0–34.0)
MCH: 29.9 pg (ref 26.0–34.0)
MCH: 30.5 pg (ref 26.0–34.0)
MCHC: 34.7 g/dL (ref 30.0–36.0)
MCHC: 34.6 g/dL (ref 30.0–36.0)
MCHC: 35.2 g/dL (ref 30.0–36.0)
MCV: 86.2 fL (ref 78.0–100.0)
MCV: 86.4 fL (ref 78.0–100.0)
MCV: 86.7 fL (ref 78.0–100.0)
PLATELETS: 110 10*3/uL — AB (ref 150–400)
Platelets: 115 10*3/uL — ABNORMAL LOW (ref 150–400)
Platelets: 113 10*3/uL — ABNORMAL LOW (ref 150–400)
RBC: 2.54 MIL/uL — ABNORMAL LOW (ref 4.22–5.81)
RBC: 2.64 MIL/uL — AB (ref 4.22–5.81)
RBC: 2.49 MIL/uL — ABNORMAL LOW (ref 4.22–5.81)
RDW: 15.5 % (ref 11.5–15.5)
RDW: 15.9 % — ABNORMAL HIGH (ref 11.5–15.5)
RDW: 15.9 % — ABNORMAL HIGH (ref 11.5–15.5)
WBC: 5.2 10*3/uL (ref 4.0–10.5)
WBC: 7 10*3/uL (ref 4.0–10.5)
WBC: 7 10*3/uL (ref 4.0–10.5)

## 2014-07-14 LAB — PREPARE FRESH FROZEN PLASMA
UNIT DIVISION: 0
UNIT DIVISION: 0
Unit division: 0
Unit division: 0
Unit division: 0
Unit division: 0
Unit division: 0
Unit division: 0

## 2014-07-14 LAB — CALCIUM, IONIZED
CALCIUM ION: 0.97 mmol/L — AB (ref 1.12–1.32)
Calcium, Ion: 0.98 mmol/L — ABNORMAL LOW (ref 1.12–1.32)
Calcium, Ion: 0.99 mmol/L — ABNORMAL LOW (ref 1.12–1.32)

## 2014-07-14 LAB — APTT
APTT: 33 s (ref 24–37)
APTT: 36 s (ref 24–37)
aPTT: 34 seconds (ref 24–37)

## 2014-07-14 LAB — PREPARE CRYOPRECIPITATE
UNIT DIVISION: 0
UNIT DIVISION: 0

## 2014-07-14 LAB — BASIC METABOLIC PANEL
ANION GAP: 4 — AB (ref 5–15)
BUN: 26 mg/dL — ABNORMAL HIGH (ref 6–23)
CHLORIDE: 108 mmol/L (ref 96–112)
CO2: 30 mmol/L (ref 19–32)
Calcium: 6.6 mg/dL — ABNORMAL LOW (ref 8.4–10.5)
Creatinine, Ser: 1.59 mg/dL — ABNORMAL HIGH (ref 0.50–1.35)
GFR calc Af Amer: 49 mL/min — ABNORMAL LOW (ref >90)
GFR calc non Af Amer: 43 mL/min — ABNORMAL LOW (ref >90)
Glucose, Bld: 119 mg/dL — ABNORMAL HIGH (ref 70–99)
Potassium: 3.9 mmol/L (ref 3.5–5.1)
SODIUM: 142 mmol/L (ref 135–145)

## 2014-07-14 LAB — PREPARE PLATELET PHERESIS: Unit division: 0

## 2014-07-14 LAB — MAGNESIUM: MAGNESIUM: 1.3 mg/dL — AB (ref 1.5–2.5)

## 2014-07-14 LAB — FIBRINOGEN
FIBRINOGEN: 269 mg/dL (ref 204–475)
Fibrinogen: 312 mg/dL (ref 204–475)
Fibrinogen: 413 mg/dL (ref 204–475)

## 2014-07-14 LAB — PREPARE RBC (CROSSMATCH)

## 2014-07-14 LAB — PROTIME-INR
INR: 1.2 (ref 0.00–1.49)
Prothrombin Time: 15.3 seconds — ABNORMAL HIGH (ref 11.6–15.2)

## 2014-07-14 LAB — HEMOGLOBIN AND HEMATOCRIT, BLOOD
HCT: 21.8 % — ABNORMAL LOW (ref 39.0–52.0)
Hemoglobin: 7.7 g/dL — ABNORMAL LOW (ref 13.0–17.0)

## 2014-07-14 LAB — PHOSPHORUS: Phosphorus: 3.6 mg/dL (ref 2.3–4.6)

## 2014-07-14 MED ORDER — MAGNESIUM SULFATE 4 GM/100ML IV SOLN
4.0000 g | Freq: Once | INTRAVENOUS | Status: AC
  Administered 2014-07-14: 4 g via INTRAVENOUS
  Filled 2014-07-14: qty 100

## 2014-07-14 MED ORDER — SODIUM CHLORIDE 0.9 % IV SOLN
1.0000 g | Freq: Once | INTRAVENOUS | Status: AC
  Administered 2014-07-14: 1 g via INTRAVENOUS
  Filled 2014-07-14: qty 10

## 2014-07-14 MED ORDER — FENTANYL CITRATE (PF) 100 MCG/2ML IJ SOLN
12.5000 ug | INTRAMUSCULAR | Status: DC | PRN
  Administered 2014-07-14 – 2014-07-15 (×7): 25 ug via INTRAVENOUS
  Filled 2014-07-14 (×7): qty 2

## 2014-07-14 MED ORDER — LIP MEDEX EX OINT
TOPICAL_OINTMENT | CUTANEOUS | Status: DC | PRN
  Filled 2014-07-14: qty 7

## 2014-07-14 MED ORDER — TRAZODONE HCL 50 MG PO TABS
50.0000 mg | ORAL_TABLET | Freq: Once | ORAL | Status: AC
  Administered 2014-07-14: 50 mg via ORAL
  Filled 2014-07-14: qty 1

## 2014-07-14 NOTE — Progress Notes (Signed)
CARE MANAGEMENT NOTE 07/14/2014  Patient:  CULLEN, LAHAIE   Account Number:  [id number]  Date Initiated:  07/14/2014  Documentation initiated by:  DAVIS,RHONDA  Subjective/Objective Assessment:   69 yo male L retroperitoneal tumor involving renal arteries. Requiring L nephrectomy and mobilization of pancreas to resect. Complicated by massive blood loss.     Action/Plan:   home when stable   Anticipated DC Date:  07/17/2014   Anticipated DC Plan:  HOME/SELF CARE  In-house referral  NA      DC Planning Services  CM consult      Choice offered to / List presented to:             Status of service:  In process, will continue to follow Medicare Important Message given?   (If response is "NO", the following Medicare IM given date fields will be blank) Date Medicare IM given:   Medicare IM given by:   Date Additional Medicare IM given:   Additional Medicare IM given by:    Discharge Disposition:    Per UR Regulation:  Reviewed for med. necessity/level of care/duration of stay  If discussed at [place] of Stay Meetings, dates discussed:    Comments:  July 14, 2014/Rhonda L. Rosana Hoes, RN, BSN, CCM. Case Management [place] [phone] No discharge needs present of time of review. Vent day 1

## 2014-07-14 NOTE — Addendum Note (Signed)
Addendum  created 07/14/14 9038 by Victoriano Lain, CRNA   Modules edited: Anesthesia Flowsheet

## 2014-07-14 NOTE — Progress Notes (Signed)
[facility] Progress Note Patient Name: Kameron Blethen DOB: [birth year] MRN: [id number]   Date of Service  07/14/2014  HPI/Events of Note  Patient requests home Trazodone to sleep. Nurse requests Carmex lip balm.  eICU Interventions  Will order Trazadone and Carmex.     Intervention Category Minor Interventions: Routine modifications to care plan (e.g. PRN medications for pain, fever);Communication with other healthcare providers and/or family  Lysle Dingwall 07/14/2014, 9:03 PM

## 2014-07-14 NOTE — Progress Notes (Signed)
PULMONARY / CRITICAL CARE MEDICINE   Name: Darryl Griffith MRN: [id number] DOB: 12/23/1945    ADMISSION DATE:  07/13/2014 CONSULTATION DATE:  07/13/2014  REFERRING MD :  Barry Dienes  CHIEF COMPLAINT:  Abdominal mass  INITIAL PRESENTATION: 69 yo male L retroperitoneal tumor involving renal arteries. Requiring L nephrectomy and mobilization of pancreas to resect. Complicated by massive blood loss. Hasc. On ventilator and to ICU post operatively. PCCM to see.  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  SIGNIFICANT EVENTS:   HISTORY OF PRESENT ILLNESS:  69 year old male with PMH as below, which includes HTN, HLD, and angina. Several months ago he began complaining of back and flank pain affecting sleep, which prompted him to seek evaluation. CT showed large retroperitoneal mass. 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 PET was negative for metastasis. It was deemed that this would require surgical intervention and he presented 4/14 for this. Tumor was found to involve renal arteries and removal required left nephrectomy and mobilization of pancreas. Also had massive blood loss.  Received 14 units, PRBC, FFP, Platelets and Cryoprecipitate in OR. He remains of ventilatory post-operatively requiring ICU admission of PCCM consultation.      SUBJECTIVE: afebrile Making urine  VITAL SIGNS: Temp:  [93.6 F (34.2 C)-99.2 F (37.3 C)] 99.2 F (37.3 C) (04/15 0800) Pulse Rate:  [80-116] 96 (04/15 1012) Resp:  [0-31] 31 (04/15 1012) BP: (116-136)/(48-62) 136/54 mmHg (04/15 0749) SpO2:  [98 %-100 %] 99 % (04/15 0900) Arterial Line BP: (78-163)/(45-87) 160/63 mmHg (04/15 0900) FiO2 (%):  [30 %-100 %] 30 % (04/15 0800) HEMODYNAMICS: CVP:  [9 mmHg-12 mmHg] 11 mmHg VENTILATOR SETTINGS: Vent Mode:  [-] PSV;CPAP FiO2 (%):  [30 %-100 %] 30 % Set Rate:  [12 bmp-20 bmp] 12 bmp Vt Set:  [550 mL]  550 mL PEEP:  [5 cmH20] 5 cmH20 Pressure Support:  [5 cmH20] 5 cmH20 Plateau Pressure:  [14 cmH20-16 cmH20] 16 cmH20 INTAKE / OUTPUT:  Intake/Output Summary (Last 24 hours) at 07/14/14 1017 Last data filed at 07/14/14 0900  Gross per 24 hour  Intake 25591.93 ml  Output   8030 ml  Net 17561.93 ml    PHYSICAL EXAMINATION: General:  Obese male awake and comfortable on vent Neuro: Alert, oriented, non-focal HEENT:  [place]/AT, PERRL, no JVD, ETT in place Cardiovascular:  RRR, no MRG Lungs:  Distant breath sounds.  Abdomen:  Soft, non-tender, surgical dressings, drains in place Musculoskeletal:  No acute deformity or ROM limitation Skin: Grossly intact, no edema  LABS:  CBC  Recent Labs Lab 07/13/14 1939 07/13/14 2258 07/14/14 0610  WBC 6.7 5.1 5.2  HGB 7.2* 6.0* 7.6*  HCT 20.9* 17.6* 21.9*  PLT 46* 90* 115*   Coag's  Recent Labs Lab 07/13/14 1435 07/13/14 1800 07/13/14 1939 07/14/14 0610  APTT 74* 40*  --  34  INR 2.54* 1.42 1.42  --    BMET  Recent Labs Lab 07/13/14 1138 07/13/14 2258 07/14/14 0610  NA 136 141 142  K 4.0 3.7 3.9  CL  --  108 108  CO2  --  27 30  BUN  --  25* 26*  CREATININE  --  1.52* 1.59*  GLUCOSE  --  194* 119*   Electrolytes  Recent Labs Lab 07/13/14 2258 07/14/14 0610  CALCIUM 6.7* 6.6*  MG  --  1.3*  PHOS  --  3.6   Sepsis Markers No results for input(s):  LATICACIDVEN, PROCALCITON, O2SATVEN in the last 168 hours. ABG  Recent Labs Lab 07/13/14 1645 07/13/14 1945  PHART 7.212* 7.412  PCO2ART 52.7* 38.2  PO2ART 476.0* 209.0*   Liver Enzymes No results for input(s): AST, ALT, ALKPHOS, BILITOT, ALBUMIN in the last 168 hours. Cardiac Enzymes No results for input(s): TROPONINI, PROBNP in the last 168 hours. Glucose No results for input(s): GLUCAP in the last 168 hours.  Imaging Dg Chest Port 1 View  07/13/2014   CLINICAL DATA:  Central line placement.  EXAM: PORTABLE CHEST - 1 VIEW  COMPARISON:  02/03/2014   FINDINGS: Right internal jugular central venous line tip projects in the lower superior vena cava just above the caval atrial junction. There is no pneumothorax.  Endotracheal tube tip projects 2.8 cm above the chronic, well positioned. Nasogastric tube is also well-positioned passing well below the diaphragm into the stomach.  There is central interstitial thickening, but no focal lung consolidation. No pleural effusion or pneumothorax. Cardiac silhouette is normal in size. No gross mediastinal or hilar masses.  IMPRESSION: 1. Central venous line and endotracheal tube and nasogastric tube are well positioned as described. No pneumothorax. 2. Central interstitial thickening. This is exaggerated by low lung volumes. This could reflect diffuse interstitial infiltrate or interstitial edema.   Electronically Signed   By: Lajean Manes M.D.   On: 07/13/2014 16:24     ASSESSMENT / PLAN:  PULMONARY OETT 4/14 > 4/15 A: Acute respiratory failure in post-operative setting  P:   Doing well with SBTs VAP bundle extubate   CARDIOVASCULAR RIJ CVL 4/14 > R Rad art line 4/14 > 4/15 A:  Shock due to intraoperative hemorrhage Prolonged QT H/o HTN, HLD  P:  Telemtry montoring MAP goal > 72mm/Hg Off Phenylephrine - can dc a line Holding home simvastatin, losartan/HCTZ, ASA for now  RENAL A:  AKI S/p nephrectomy Hypocalcemia/hypomag  P:  NS @ 125/h follow Bmet Replace electrolytes as indicated  GASTROINTESTINAL JP drain 4/14 > A:   S/p retroperitoneal mass removal  P:   NPO Management per surgery NGT to low intermittent suction Protonix for SUP  HEMATOLOGIC A:   Acute blood loss anemia Thrombocytopenia Coagulopathy of massive transfusion- resolved  P:  CBC q 4 hours - can reduce frequency now Transfuse per ICU guidelines  INFECTIOUS A:   Surgical prophylaxis  P:   Ancef per surgery Follow WBC and fever curve  ENDOCRINE A:   No acute issues  P:   Follow glucose on  chemistry  NEUROLOGIC A:   Pain management  P:   RASS goal:0/1 Fentanyl int  for pain control   FAMILY  - Updates: Daughter (nurse practitioner) updated bedside 4/14  - Inter-disciplinary family meet or Palliative Care meeting due by:  NA  If does well, PCCM to sign off   The patient is critically ill with multiple organ systems failure and requires high complexity decision making for assessment and support, frequent evaluation and titration of therapies, application of advanced monitoring technologies and extensive interpretation of multiple databases. Critical Care Time devoted to patient care services described in this note independent of APP time is 32 minutes.   Kara Mead MD. [place] Flood. [facility] Pager [phone] If no response call [phone]    07/14/2014 10:17 AM

## 2014-07-14 NOTE — Progress Notes (Signed)
1 Day Post-Op  Subjective: Received significant blood products overnight.  Weaned off neosynephrine.  On PS 5/5 this am.  Quite awake.    Objective: Vital signs in last 24 hours: Temp:  [93.6 F (34.2 C)-98.3 F (36.8 C)] 98.3 F (36.8 C) (04/15 0525) Pulse Rate:  [80-116] 85 (04/15 0800) Resp:  [0-27] 16 (04/15 0800) BP: (116-136)/(48-62) 136/54 mmHg (04/15 0749) SpO2:  [98 %-100 %] 99 % (04/15 0800) Arterial Line BP: (78-163)/(45-87) 147/58 mmHg (04/15 0800) FiO2 (%):  [30 %-100 %] 30 % (04/15 0800) Last BM Date: 07/14/14  Intake/Output from previous day: 04/14 0701 - 04/15 0700 In: 27288.8 [I.V.:17472.8; YDXAJ:2878; IV MVEHMCNOB:096] Out: 2836 [OQHUT:6546; Emesis/NG output:400; Drains:1815; Blood:5425] Intake/Output this shift: Total I/O In: 178.1 [I.V.:178.1] Out: -   General appearance: alert, cooperative and no distress Resp: breathing comfortably Cardio: regular rate and rhythm GI: soft, sl distended, approp tender.  drain serosanguinous.    Lab Results:   Recent Labs  07/13/14 2258 07/14/14 0610  WBC 5.1 5.2  HGB 6.0* 7.6*  HCT 17.6* 21.9*  PLT 90* 115*   BMET  Recent Labs  07/13/14 2258 07/14/14 0610  NA 141 142  K 3.7 3.9  CL 108 108  CO2 27 30  GLUCOSE 194* 119*  BUN 25* 26*  CREATININE 1.52* 1.59*  CALCIUM 6.7* 6.6*   PT/INR  Recent Labs  07/13/14 1800 07/13/14 1939  LABPROT 17.5* 17.5*  INR 1.42 1.42   ABG  Recent Labs  07/13/14 1645 07/13/14 1945  PHART 7.212* 7.412  HCO3 20.4 24.6*    Studies/Results: Dg Chest Port 1 View  07/13/2014   CLINICAL DATA:  Central line placement.  EXAM: PORTABLE CHEST - 1 VIEW  COMPARISON:  02/03/2014  FINDINGS: Right internal jugular central venous line tip projects in the lower superior vena cava just above the caval atrial junction. There is no pneumothorax.  Endotracheal tube tip projects 2.8 cm above the chronic, well positioned. Nasogastric tube is also well-positioned passing well below  the diaphragm into the stomach.  There is central interstitial thickening, but no focal lung consolidation. No pleural effusion or pneumothorax. Cardiac silhouette is normal in size. No gross mediastinal or hilar masses.  IMPRESSION: 1. Central venous line and endotracheal tube and nasogastric tube are well positioned as described. No pneumothorax. 2. Central interstitial thickening. This is exaggerated by low lung volumes. This could reflect diffuse interstitial infiltrate or interstitial edema.   Electronically Signed   By: Lajean Manes M.D.   On: 07/13/2014 16:24    Anti-infectives: Anti-infectives    Start     Dose/Rate Route Frequency Ordered Stop   07/13/14 1800  ceFAZolin (ANCEF) IVPB 1 g/50 mL premix     1 g 100 mL/hr over 30 Minutes Intravenous Every 6 hours 07/13/14 1618 07/14/14 0648   07/13/14 0547  ceFAZolin (ANCEF) IVPB 2 g/50 mL premix     2 g 100 mL/hr over 30 Minutes Intravenous On call to O.R. 07/13/14 0547 07/13/14 1155      Assessment/Plan: s/p Procedure(s): RESECTION OF left RETROPERITONEAL MASS (Left), left nephrectomy, mobilization of splenic flexure. Continue foley due to strict I&O, patient critically ill, patient in ICU and urinary output monitoring Await pathology  ABL anemia - may need additional transfusion, but last CBC was drawn while blood was still running.  Will repeat around 10.  Await ionized calcium. Acute renal insufficiency- left kidney removed with retroperitoneal mass, would like to keep hydrated.   DIC - appears resolved.  NPO/NGT.    LOS: 1 day    [facility] 07/14/2014

## 2014-07-14 NOTE — Progress Notes (Signed)
FFP, Unit # [id number] given 07/14/2014.  At [facility] were as follows- Temp 97.8 axillary, BP 117/48, RR 20, HR 90. Started at 0039 Rate 162ml/H, Volume 75ml.  15 minute vitals (0054) were as follows- Temp 97.8, BP 110/48, RR 20, HR 87 FFP continued at 120ml/H until 0330 for a total volume of 232ml Blood type O+ in the Right wrist with a Y set with filter. No reaction was noted.   Orma Render, RN and Perry Mount, RN both checked off on this blood product.  Normal practice is to release and collect blood products as they can be administered, which can be based on the amount of IV access available at that time. This unit of FFP was released at the same time as a unit of Cryo to be given to the same patient. The nurse released both blood products, but did not collect both from the blood bank at the same time. It is assumed this was due to lack of IV access to run both units at the same time. The [place], Unit [id number] [phone] [id number], was collected from the blood bank first. When the Cryo was scanned to be administered, it was accidentally documented under the FFP slot. This removed the FFP from the template to be documented under, but the Cryo slot remained. When I came to administer the plasma at a later time, I found that there was not a location to chart the FFP. I called the blood bank and verified that they had released the correct amount of FFP per the orders. I reviewed my orders in the chart and verified that I was to give the FFP. I then reviewed the completed orders under blood products. At that time I noted that all FFP orders had been documented under. I then went to the paper chart and located the blood product slips and referenced each unit number on the blood product slips with each documented FFP. Through this process I was able to determine that Cryo that was given and charted under the FFP that had been released at the same time. At that time I proceeded to collect the FFP from the blood  bank and chart the information on paper, which was transcribed into this note.   Luther Parody, RN

## 2014-07-14 NOTE — Procedures (Signed)
Extubation Procedure Note  Patient Details:   Name: Darryl Griffith DOB: 1945/09/07 MRN: [id number]   Airway Documentation:     Evaluation  O2 sats: stable throughout Complications: No apparent complications Patient did tolerate procedure well. Bilateral Breath Sounds: Clear, Diminished Suctioning: Oral, Airway Yes  Irish Elders [place] 07/14/2014, 10:13 AM

## 2014-07-14 NOTE — Progress Notes (Signed)
Patient receiving PRBC, FFP, and Platelets throughout the night. Labs held until blood products were complete. Luther Parody, RN

## 2014-07-14 NOTE — Progress Notes (Addendum)
Received referral from outside source.  Pt's son, Antony Haste, has high-functioning aspberger's, brittle type 1 diabetes, some impairment from past diabetic coma.  [place] (pt.) has been primary support for son and they have worked together at [employer].  This chaplain familiar with pt's son from working with community organization supporting those with Aspberger's.  Son has not been present at hospital, but has been very concerned for father's health and contacting community organization for support.  Antony Haste will be visiting this afternoon.   Darryl Griffith (pt) recently retired and is planning to move to [facility] area in [place].  Family reports this is a difficult transition for Antony Haste, as the social group he has formed in [place] is his primary social support.      Jerene Pitch [place] [facility]

## 2014-07-15 ENCOUNTER — Encounter (HOSPITAL_COMMUNITY): Payer: Self-pay | Admitting: Surgery

## 2014-07-15 DIAGNOSIS — D62 Acute posthemorrhagic anemia: Secondary | ICD-10-CM

## 2014-07-15 DIAGNOSIS — D696 Thrombocytopenia, unspecified: Secondary | ICD-10-CM

## 2014-07-15 DIAGNOSIS — N179 Acute kidney failure, unspecified: Secondary | ICD-10-CM

## 2014-07-15 DIAGNOSIS — I10 Essential (primary) hypertension: Secondary | ICD-10-CM | POA: Diagnosis present

## 2014-07-15 HISTORY — DX: Acute posthemorrhagic anemia: D62

## 2014-07-15 LAB — FIBRINOGEN
Fibrinogen: 379 mg/dL (ref 204–475)
Fibrinogen: 411 mg/dL (ref 204–475)

## 2014-07-15 LAB — BASIC METABOLIC PANEL
Anion gap: 3 — ABNORMAL LOW (ref 5–15)
BUN: 30 mg/dL — ABNORMAL HIGH (ref 6–23)
CO2: 28 mmol/L (ref 19–32)
CREATININE: 1.64 mg/dL — AB (ref 0.50–1.35)
Calcium: 6.9 mg/dL — ABNORMAL LOW (ref 8.4–10.5)
Chloride: 110 mmol/L (ref 96–112)
GFR calc Af Amer: 48 mL/min — ABNORMAL LOW (ref >90)
GFR calc non Af Amer: 41 mL/min — ABNORMAL LOW (ref >90)
Glucose, Bld: 111 mg/dL — ABNORMAL HIGH (ref 70–99)
POTASSIUM: 3.6 mmol/L (ref 3.5–5.1)
Sodium: 141 mmol/L (ref 135–145)

## 2014-07-15 LAB — CBC
HEMATOCRIT: 21.4 % — AB (ref 39.0–52.0)
HEMATOCRIT: 20.8 % — AB (ref 39.0–52.0)
HEMOGLOBIN: 7.2 g/dL — AB (ref 13.0–17.0)
Hemoglobin: 7.4 g/dL — ABNORMAL LOW (ref 13.0–17.0)
MCH: 30.1 pg (ref 26.0–34.0)
MCH: 30.4 pg (ref 26.0–34.0)
MCHC: 34.6 g/dL (ref 30.0–36.0)
MCHC: 34.6 g/dL (ref 30.0–36.0)
MCV: 87 fL (ref 78.0–100.0)
MCV: 87.8 fL (ref 78.0–100.0)
Platelets: 114 10*3/uL — ABNORMAL LOW (ref 150–400)
Platelets: 107 10*3/uL — ABNORMAL LOW (ref 150–400)
RBC: 2.46 MIL/uL — ABNORMAL LOW (ref 4.22–5.81)
RBC: 2.37 MIL/uL — ABNORMAL LOW (ref 4.22–5.81)
RDW: 15.9 % — ABNORMAL HIGH (ref 11.5–15.5)
RDW: 16 % — ABNORMAL HIGH (ref 11.5–15.5)
WBC: 7.1 10*3/uL (ref 4.0–10.5)
WBC: 7 10*3/uL (ref 4.0–10.5)

## 2014-07-15 LAB — CALCIUM, IONIZED
CALCIUM ION: 1.05 mmol/L — AB (ref 1.12–1.32)
CALCIUM ION: 1.06 mmol/L — AB (ref 1.12–1.32)
Calcium, Ion: 1.06 mmol/L — ABNORMAL LOW (ref 1.12–1.32)

## 2014-07-15 LAB — APTT
APTT: 33 s (ref 24–37)
aPTT: 31 seconds (ref 24–37)

## 2014-07-15 LAB — HEMOGLOBIN: HEMOGLOBIN: 7.3 g/dL — AB (ref 13.0–17.0)

## 2014-07-15 MED ORDER — ONDANSETRON HCL 4 MG/2ML IJ SOLN: 4.0000 mg | Freq: Four times a day (QID) | INTRAMUSCULAR | Status: DC | PRN

## 2014-07-15 MED ORDER — LIP MEDEX EX OINT
1.0000 "application " | TOPICAL_OINTMENT | Freq: Two times a day (BID) | CUTANEOUS | Status: DC
  Administered 2014-07-15 – 2014-07-23 (×13): 1 via TOPICAL
  Filled 2014-07-15: qty 7

## 2014-07-15 MED ORDER — FUROSEMIDE 10 MG/ML IJ SOLN
40.0000 mg | Freq: Once | INTRAMUSCULAR | Status: AC
  Administered 2014-07-15: 40 mg via INTRAVENOUS
  Filled 2014-07-15: qty 4

## 2014-07-15 MED ORDER — BISACODYL 10 MG RE SUPP: 10.0000 mg | Freq: Two times a day (BID) | RECTAL | Status: DC | PRN

## 2014-07-15 MED ORDER — HYDROMORPHONE 0.3 MG/ML IV SOLN
INTRAVENOUS | Status: DC
  Administered 2014-07-15: 08:00:00 via INTRAVENOUS
  Administered 2014-07-15: 1.2 mg via INTRAVENOUS
  Administered 2014-07-15: 0.9 mg via INTRAVENOUS
  Administered 2014-07-15: 1.6 mg via INTRAVENOUS
  Administered 2014-07-16: 1.5 mg via INTRAVENOUS
  Administered 2014-07-16: 0.9 mg via INTRAVENOUS
  Administered 2014-07-16: 09:00:00 via INTRAVENOUS
  Administered 2014-07-16: 0.9 mg via INTRAVENOUS
  Administered 2014-07-16 – 2014-07-17 (×2): 1.2 mg via INTRAVENOUS
  Administered 2014-07-17: 06:00:00 via INTRAVENOUS
  Administered 2014-07-17: 0.9 mg via INTRAVENOUS
  Administered 2014-07-17: 1.5 mg via INTRAVENOUS
  Administered 2014-07-17: 1.8 mg via INTRAVENOUS
  Administered 2014-07-17: 1.5 mg via INTRAVENOUS
  Administered 2014-07-17: 1.2 mg via INTRAVENOUS
  Administered 2014-07-18 (×2): 0.9 mg via INTRAVENOUS
  Administered 2014-07-18: 0.6 mg via INTRAVENOUS
  Filled 2014-07-15 (×4): qty 25

## 2014-07-15 MED ORDER — SODIUM CHLORIDE 0.9 % IJ SOLN: 9.0000 mL | INTRAMUSCULAR | Status: DC | PRN

## 2014-07-15 MED ORDER — DIPHENHYDRAMINE HCL 50 MG/ML IJ SOLN: 12.5000 mg | Freq: Four times a day (QID) | INTRAMUSCULAR | Status: DC | PRN

## 2014-07-15 MED ORDER — PHENOL 1.4 % MT LIQD
2.0000 | OROMUCOSAL | Status: DC | PRN
  Filled 2014-07-15: qty 177

## 2014-07-15 MED ORDER — ALUM & MAG HYDROXIDE-SIMETH 200-200-20 MG/5ML PO SUSP: 30.0000 mL | Freq: Four times a day (QID) | ORAL | Status: DC | PRN

## 2014-07-15 MED ORDER — LACTATED RINGERS IV BOLUS (SEPSIS): 1000.0000 mL | Freq: Three times a day (TID) | INTRAVENOUS | Status: AC | PRN

## 2014-07-15 MED ORDER — MENTHOL 3 MG MT LOZG: 1.0000 | LOZENGE | OROMUCOSAL | Status: DC | PRN

## 2014-07-15 MED ORDER — MAGIC MOUTHWASH
15.0000 mL | Freq: Four times a day (QID) | ORAL | Status: DC | PRN
  Filled 2014-07-15: qty 15

## 2014-07-15 MED ORDER — PROMETHAZINE HCL 25 MG/ML IJ SOLN: 6.2500 mg | INTRAMUSCULAR | Status: DC | PRN

## 2014-07-15 MED ORDER — METOPROLOL TARTRATE 1 MG/ML IV SOLN
5.0000 mg | Freq: Four times a day (QID) | INTRAVENOUS | Status: DC | PRN
  Administered 2014-07-17: 5 mg via INTRAVENOUS
  Filled 2014-07-15 (×3): qty 5

## 2014-07-15 MED ORDER — BUPIVACAINE 0.25 % ON-Q PUMP DUAL CATH 300 ML
300.0000 mL | INJECTION | Status: DC
  Filled 2014-07-15: qty 300

## 2014-07-15 MED ORDER — HYDROMORPHONE HCL 1 MG/ML IJ SOLN: 1.0000 mg | INTRAMUSCULAR | Status: DC | PRN

## 2014-07-15 MED ORDER — POTASSIUM CHLORIDE 10 MEQ/50ML IV SOLN
10.0000 meq | INTRAVENOUS | Status: DC | PRN
  Filled 2014-07-15: qty 50

## 2014-07-15 MED ORDER — NALOXONE HCL 0.4 MG/ML IJ SOLN: 0.4000 mg | INTRAMUSCULAR | Status: DC | PRN

## 2014-07-15 MED ORDER — ACETAMINOPHEN 650 MG RE SUPP: 650.0000 mg | Freq: Four times a day (QID) | RECTAL | Status: DC | PRN

## 2014-07-15 MED ORDER — FENTANYL CITRATE (PF) 100 MCG/2ML IJ SOLN: 12.5000 ug | INTRAMUSCULAR | Status: DC | PRN

## 2014-07-15 NOTE — Progress Notes (Signed)
PULMONARY / CRITICAL CARE MEDICINE   Name: Darryl Griffith MRN: [id number] DOB: 09-21-1945    ADMISSION DATE:  07/13/2014 CONSULTATION DATE:  07/13/2014  REFERRING MD :  Barry Dienes  CHIEF COMPLAINT:  Abdominal mass  INITIAL PRESENTATION: 69 yo male L retroperitoneal tumor involving renal arteries. Requiring L nephrectomy and mobilization of pancreas to resect. Complicated by massive blood loss. Hasc. On ventilator and to ICU post operatively. PCCM to see.  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  SIGNIFICANT EVENTS:   HISTORY OF PRESENT ILLNESS:  69 year old male with PMH as below, which includes HTN, HLD, and angina. Several months ago he began complaining of back and flank pain affecting sleep, which prompted him to seek evaluation. CT showed large retroperitoneal mass. 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 PET was negative for metastasis. It was deemed that this would require surgical intervention and he presented 4/14 for this. Tumor was found to involve renal arteries and removal required left nephrectomy and mobilization of pancreas. Also had massive blood loss.  Received 14 units, PRBC, FFP, Platelets and Cryoprecipitate in OR. He remains of ventilatory post-operatively requiring ICU admission of PCCM consultation.    PAST MEDICAL HISTORY :   has a past medical history of Hypertension; Elevated cholesterol; Anginal pain; and Left Retroperitoneal mass s/p resection/L nephrectomy 07/13/2014.  has past surgical history that includes surgery for arm fracture (Right, 1952); Knee arthroscopy (Right, 1970's); Colonoscopy with propofol (N/A, 10/20/2013); Hot hemostasis (N/A, 10/20/2013); and tigger finger release. Prior to Admission medications   Medication Sig Start Date End Date Taking? Authorizing Provider  aspirin EC 81 MG tablet Take 81 mg by mouth at bedtime.   Yes Historical  Provider, MD  Coenzyme Q10 (COQ10) 100 MG CAPS Take 100-200 mg by mouth 2 (two) times daily. Take 1 tablet (100 mg) every morning and 2 tablets (200 mg) every night   Yes Historical Provider, MD  fexofenadine (ALLEGRA) 180 MG tablet Take 180 mg by mouth every evening.    Yes Historical Provider, MD  Ginkgo Biloba (GNP GINGKO BILOBA EXTRACT PO) Take 120 mg by mouth 2 (two) times daily.   Yes Historical Provider, MD  ibuprofen (ADVIL,MOTRIN) 200 MG tablet Take 400-600 mg by mouth every 6 (six) hours as needed for headache or moderate pain.   Yes Historical Provider, MD  losartan-hydrochlorothiazide (HYZAAR) 100-25 MG per tablet Take 1 tablet by mouth every morning.    Yes Historical Provider, MD  Multiple Minerals-Vitamins (CALCIUM CITRATE PLUS/MAGNESIUM) TABS Take 1 tablet by mouth every morning. Calcium citrate 500 mg, magnesium 800 mg, zinc 10 mg   Yes Historical Provider, MD  Multiple Vitamin (MULTIVITAMIN WITH MINERALS) TABS tablet Take 1 tablet by mouth every morning.    Yes Historical Provider, MD  Omega-3 Fatty Acids (FISH OIL) 1200 MG CAPS Take 2,400 mg by mouth 2 (two) times daily.   Yes Historical Provider, MD  OVER THE COUNTER MEDICATION Take 1 tablet by mouth at bedtime. Beta Sitosterol 375 mg   Yes Historical Provider, MD  OVER THE COUNTER MEDICATION Take 400-800 mg by mouth 2 (two) times daily. Bio Astin: 2 tablets (800 mg) every morning and 1 tablet (400 mg) every night   Yes Historical Provider, MD  simvastatin (ZOCOR) 40 MG tablet Take 40 mg by mouth at bedtime.    Yes Historical Provider, MD  traZODone (DESYREL) 50 MG tablet Take 50 mg by mouth at bedtime.  Yes Historical Provider, MD  vitamin C (ASCORBIC ACID) 500 MG tablet Take 500 mg by mouth every morning.    Yes Historical Provider, MD  vitamin E 400 UNIT capsule Take 400 Units by mouth every morning.    Yes Historical Provider, MD   No Known Allergies  FAMILY HISTORY:  is adopted. SOCIAL HISTORY:  reports that he has  never smoked. He has never used smokeless tobacco. He reports that he drinks alcohol. He reports that he does not use illicit drugs.  REVIEW OF SYSTEMS:  unable  SUBJECTIVE:   VITAL SIGNS: Temp:  [98.2 F (36.8 C)-99.3 F (37.4 C)] 98.5 F (36.9 C) (04/16 0800) Pulse Rate:  [90-107] 100 (04/16 1200) Resp:  [12-30] 22 (04/16 1200) BP: (142-172)/(61-69) 158/69 mmHg (04/16 1200) SpO2:  [91 %-100 %] 97 % (04/16 1200) HEMODYNAMICS: CVP:  [7 mmHg-11 mmHg] 8 mmHg VENTILATOR SETTINGS:   INTAKE / OUTPUT:  Intake/Output Summary (Last 24 hours) at 07/15/14 1225 Last data filed at 07/15/14 1200  Gross per 24 hour  Intake   2880 ml  Output   3710 ml  Net   -830 ml    PHYSICAL EXAMINATION: General:  Obese male awake and comfortable on vent Neuro: Alert, oriented, non-focal HEENT:  [place]/AT, PERRL, no JVD, ETT in place Cardiovascular:  RRR, no MRG Lungs:  Distant breath sounds.  Abdomen:  Soft, non-tender, surgical dressings, drains in place Musculoskeletal:  No acute deformity or ROM limitation Skin: Grossly intact, no edema  LABS:  CBC  Recent Labs Lab 07/14/14 2226 07/15/14 0058 07/15/14 0516  WBC 7.0 7.0 7.1  HGB 7.6* 7.4* 7.2*  HCT 21.6* 21.4* 20.8*  PLT 113* 114* 107*   Coag's  Recent Labs Lab 07/13/14 1800 07/13/14 1939  07/14/14 1055 07/14/14 1830 07/15/14 0058 07/15/14 0515  APTT 40*  --   < > 33 36 31 33  INR 1.42 1.42  --  1.20  --   --   --   < > = values in this interval not displayed. BMET  Recent Labs Lab 07/13/14 2258 07/14/14 0610 07/15/14 0516  NA 141 142 141  K 3.7 3.9 3.6  CL 108 108 110  CO2 27 30 28   BUN 25* 26* 30*  CREATININE 1.52* 1.59* 1.64*  GLUCOSE 194* 119* 111*   Electrolytes  Recent Labs Lab 07/13/14 2258 07/14/14 0610 07/15/14 0516  CALCIUM 6.7* 6.6* 6.9*  MG  --  1.3*  --   PHOS  --  3.6  --    Sepsis Markers No results for input(s): LATICACIDVEN, PROCALCITON, O2SATVEN in the last 168 hours. ABG  Recent  Labs Lab 07/13/14 1645 07/13/14 1945  PHART 7.212* 7.412  PCO2ART 52.7* 38.2  PO2ART 476.0* 209.0*   Liver Enzymes No results for input(s): AST, ALT, ALKPHOS, BILITOT, ALBUMIN in the last 168 hours. Cardiac Enzymes No results for input(s): TROPONINI, PROBNP in the last 168 hours. Glucose No results for input(s): GLUCAP in the last 168 hours.  Imaging No results found.   ASSESSMENT / PLAN:  PULMONARY OETT 4/14 > 4/15 A: Acute respiratory failure in post-operative setting  P:   Titrate O2 for sat of 88-92%. IS per RT protocol Ambulate as able.  CARDIOVASCULAR RIJ CVL 4/14 > R Rad art line 4/14 >4/15 A:  Shock due to intraoperative hemorrhage Prolonged QT H/o HTN, HLD  P:  Telemtry montoring MAP goal > 80mm/Hg Blood product administration see heme section Serial CBC  D/C pressors Holding home simvastatin, losartan/HCTZ,  ASA for now  RENAL A:   No acute issues  P:   Repeat Bmet Replace electrolytes as indicated  GASTROINTESTINAL JP drain 4/14 > A:   S/p retroperitoneal mass removal  P:   Diet per surgery. Management per surgery NGT to low intermittent suction Protonix for SUP  HEMATOLOGIC A:   Acute blood loss anemia Thrombocytopenia Fibrinogen low  P:  CBC q 4 hours FFP and Plt from OR currently infusing Transfuse per ICU guidelines  INFECTIOUS A:   Surgical prophylaxis  P:   Ancef per surgery now off Follow WBC and fever curve  ENDOCRINE A:   No acute issues  P:   Follow glucose on chemistry  NEUROLOGIC A:   Pain management  P:   RASS goal:0/1 Fentanyl gtt for pain control PRN versed for sedation  Extubated and doing well, PCCM will sign off, please call back if needed.  Rush Farmer, M.D. [facility] Pulmonary/Critical Care Medicine. Pager: [phone]. After hours pager: [phone].

## 2014-07-15 NOTE — Progress Notes (Signed)
[facility]  [address] [place], [place] [address] Phone: [phone] FAX: [phone]    Madox Corkins [id number] October 27, 1945  CARE TEAM:  PCP: Gennette Pac, MD  Outpatient Care Team: Patient Care Team: [name], MD as PCP - General (Family Medicine) Erroll Luna, MD as Consulting Physician (General Surgery)  Inpatient Treatment Team: Treatment Team: Attending Provider: Stark Klein, MD; Rounding Team: Md Pccm, MD; Registered Nurse: Carl Best, RN; Registered Nurse: Meredith Mody, RN; Physical Therapist: Alphonzo Severance, PT; Registered Nurse: Philis Pique, RN  Problem List:   Principal Problem:   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  May need additional transfusion, butless likely now.  Keep drain & follow  Acute renal insufficiency- left kidney removed with retroperitoneal mass.  Stable with good UOP.  Give lasix x 1 given +19L (in theory).  PRN IVF boluses if UOP falls  DIC - appears resolved. Follow  NPO/NGT until ileus resolves.  Continue foley due to strict I&O, patient critically ill, patient in ICU and urinary output monitoring  PCA w BP better.  OnQ.  Ice/Heat  Transfer to stepdown status.  Floor tomorrow if better  HTN off pressors.  Try diuresis gently.  f/u pathology  VTE  prophylaxis- SCDs only.  No anticoagulation w coagulopathy/DIC from massive EBL.  Maybe in 48hrs if better / stable...  mobilize as tolerated to help recovery.  Get him up in stepdown & follow  [name], M.D., F.A.C.S. Gastrointestinal and Minimally Invasive Surgery [facility] [address] [place], [place] [address] [phone] Main / Paging   07/15/2014  Subjective:  Sore No flatus ICU RNs nearby  Objective:  Vital signs:  Filed Vitals:   07/15/14 0000 07/15/14 0200 07/15/14 0400 07/15/14 0600  BP: 153/62 151/61 151/63 163/68  Pulse: 95 93 90 95  Temp:      TempSrc:      Resp: _0 Height:      Weight:      SpO2: 98% 98% 98% 98%    Last BM Date: 07/14/14  Intake/Output   Yesterday:  04/15 0701 - 04/16 0700 In: 3168.1 [I.V.:2928.1; NG/GT:30; IV Piggyback:210] Out: 2115 [Urine:1535; Emesis/NG output:200; Drains:380] This shift:     Bowel function:  Flatus: n  BM: n  Drain: thinly bloody  Physical Exam:  General: Pt awake/alert/oriented x4 in mild acute distress Eyes: PERRL, normal EOM.  Sclera clear.  No icterus Neuro: CN II-XII intact w/o focal sensory/motor deficits. Lymph: No head/neck/groin lymphadenopathy Psych:  No delerium/psychosis/paranoia HENT: Normocephalic, Mucus membranes moist.  No thrush Neck: Supple, No tracheal deviation Chest: No chest wall pain w good excursion CV:  Pulses intact.  Regular rhythm MS: Normal AROM mjr joints.  No obvious deformity Abdomen: Soft.  Nondistended.  Mod tender at incisions only.  No evidence of peritonitis.  No incarcerated hernias. Ext:  SCDs BLE.  3+ anasarca.  No cyanosis Skin: No petechiae / purpura  Results:   Labs: Results for orders placed or performed during the hospital encounter of 07/13/14 (from the past 48 hour(s))  POCT I-Stat EG7     Status: Abnormal   Collection Time: 07/13/14 11:38 AM  Result Value Ref Range   pH, Ven 7.271 7.250  - 7.300   pCO2, Ven 49.2 45.0 - 50.0 mmHg   pO2, Ven 40.0 30.0 - 45.0 mmHg   Bicarbonate 22.7 20.0 - 24.0 mEq/L   TCO2 24 0 - 100 mmol/L   O2 Saturation 68.0 %   Acid-base deficit 4.0 (H) 0.0 - 2.0 mmol/L   Sodium 136 135 - 145 mmol/L   Potassium 4.0 3.5 - 5.1 mmol/L   Calcium, Ion 1.16 1.13 - 1.30 mmol/L   HCT 26.0 (L) 39.0 - 52.0 %   Hemoglobin 8.8 (L) 13.0 - 17.0 g/dL   Patient temperature 36.7 C    Sample type VENOUS   Prepare fresh frozen plasma     Status: None   Collection Time: 07/13/14  1:27 PM  Result Value Ref Range   Unit Number [id number]    Blood Component Type THAWED PLASMA    Unit division 00    Status of Unit ISSUED,FINAL    Transfusion Status OK TO TRANSFUSE    Unit Number [id number]    Blood Component Type THAWED PLASMA    Unit division 00    Status of Unit ISSUED,FINAL    Transfusion Status OK TO TRANSFUSE    Unit Number [id number]    Blood Component Type THAWED PLASMA    Unit division 00    Status of Unit ISSUED,FINAL    Transfusion Status OK TO TRANSFUSE    Unit Number [id number]    Blood Component Type THAWED PLASMA    Unit division 00    Status of Unit ISSUED,FINAL    Transfusion Status OK TO TRANSFUSE    Unit Number [id number]    Blood Component Type THAWED PLASMA    Unit division 00    Status of Unit ISSUED,FINAL    Transfusion Status OK TO TRANSFUSE    Unit Number [id number]    Blood Component Type THAWED PLASMA    Unit division 00    Status of Unit ISSUED,FINAL    Transfusion Status OK TO TRANSFUSE   Prepare platelet pheresis     Status: None   Collection Time: 07/13/14  1:38 PM  Result Value Ref Range   Unit Number [id number]    Blood Component Type PLTPHER LR1    Unit division 00    Status of Unit ISSUED,FINAL    Transfusion Status OK TO TRANSFUSE   CBC     Status: Abnormal   Collection Time: 07/13/14  2:33 PM  Result Value Ref Range   WBC 4.7 4.0 - 10.5 K/uL   RBC 2.28 (L) 4.22 - 5.81 MIL/uL   Hemoglobin  6.7 (LL) 13.0 - 17.0 g/dL    Comment: REPEATED TO VERIFY CRITICAL RESULT CALLED TO, READ BACK BY AND VERIFIED WITH: SIMPSON,L @ [id number] BY POTEAT,S    HCT 20.0 (L) 39.0 - 52.0 %   MCV 87.7 78.0 - 100.0 fL   MCH 29.4 26.0 - 34.0 pg   MCHC 33.5 30.0 - 36.0 g/dL   RDW 17.9 (H) 11.5 - 15.5 %   Platelets 57 (L) 150 - 400 K/uL    Comment: REPEATED TO VERIFY SPECIMEN CHECKED FOR CLOTS PLATELET COUNT CONFIRMED BY SMEAR     DIC (disseminated intravasc coag) panel     Status: Abnormal   Collection Time: 07/13/14  2:35 PM  Result Value Ref Range   Prothrombin Time 27.6 (H) 11.6 - 15.2 seconds   INR 2.54 (H) 0.00 - 1.49   aPTT 74 (H) 24 - 37 seconds    Comment:        IF BASELINE aPTT IS ELEVATED, SUGGEST PATIENT RISK ASSESSMENT BE USED TO DETERMINE APPROPRIATE ANTICOAGULANT THERAPY.    Fibrinogen 78 (LL) 204 - 475 mg/dL    Comment: CRITICAL RESULT CALLED TO, READ BACK BY AND VERIFIED WITH: SPOKE WITH WEST,P RN [id number] COVINGTON,N    D-Dimer, Quant 2.29 (H) 0.00 - 0.48 ug/mL-FEU    Comment:        AT THE INHOUSE ESTABLISHED CUTOFF VALUE OF 0.48 ug/mL FEU, THIS ASSAY HAS BEEN DOCUMENTED IN THE LITERATURE TO HAVE A SENSITIVITY AND NEGATIVE PREDICTIVE VALUE OF AT LEAST 98 TO 99%.  THE TEST RESULT SHOULD BE CORRELATED WITH AN ASSESSMENT OF THE CLINICAL PROBABILITY OF DVT / VTE.    Platelets 54 (L) 150 - 400 K/uL    Comment: REPEATED TO VERIFY SPECIMEN CHECKED FOR CLOTS PLATELET COUNT CONFIRMED BY SMEAR    Smear Review NO SCHISTOCYTES SEEN   Prepare cryoprecipitate     Status: None   Collection Time: 07/13/14  2:54 PM  Result Value Ref Range   Unit Number [id number]    Blood Component Type CRYPOOL THAW    Unit division 00    Status of Unit ISSUED,FINAL    Transfusion Status OK TO TRANSFUSE    Unit Number [id number]    Blood Component Type CRYPOOL THAW    Unit division 00    Status of Unit ISSUED,FINAL    Transfusion Status OK TO TRANSFUSE   MRSA PCR  Screening     Status: None   Collection Time: 07/13/14  3:50 PM  Result Value Ref Range   MRSA by PCR NEGATIVE NEGATIVE    Comment:        The GeneXpert MRSA Assay (FDA approved for NASAL specimens only), is one component of a comprehensive MRSA colonization surveillance program. It is not intended to diagnose MRSA infection nor to guide or monitor treatment for MRSA infections.   Prepare fresh frozen plasma     Status: None   Collection Time: 07/13/14  4:24 PM  Result Value Ref Range   Unit Number [id number]    Blood Component Type THAWED PLASMA    Unit division 00    Status of Unit ISSUED,FINAL    Transfusion Status OK TO TRANSFUSE    Unit Number [id number]    Blood Component Type THAWED PLASMA    Unit division 00    Status of Unit ISSUED,FINAL    Transfusion Status OK TO TRANSFUSE   Blood gas, arterial     Status: Abnormal   Collection Time: 07/13/14  4:45 PM  Result Value Ref Range   FIO2 1.00 %   Delivery systems VENTILATOR    Mode PRESSURE REGULATED VOLUME CONTROL    VT 550 mL   Rate 15 resp/min   Peep/cpap 5.0 cm H20   pH, Arterial 7.212 (L) 7.350 - 7.450   pCO2 arterial 52.7 (H) 35.0 - 45.0 mmHg   pO2, Arterial 476.0 (H) 80.0 - 100.0 mmHg   Bicarbonate 20.4 20.0 - 24.0 mEq/L   TCO2 19.5 0 - 100 mmol/L   Acid-base deficit 7.1 (H) 0.0 - 2.0 mmol/L   O2 Saturation 99.8 %     Patient temperature 98.6    Collection site A-LINE    Drawn by [phone]    Sample type ARTERIAL DRAW    Allens test (pass/fail) PASS PASS  Prepare Pheresed Platelets     Status: None (Preliminary result)   Collection Time: 07/13/14  5:43 PM  Result Value Ref Range   Unit Number [id number]    Blood Component Type PLTPHER LRI1    Unit division 00    Status of Unit ISSUED,FINAL    Transfusion Status OK TO TRANSFUSE    Unit Number [id number]    Blood Component Type PLTPHER LR2    Unit division 00    Status of Unit ISSUED    Transfusion Status OK TO TRANSFUSE   Prepare fresh  frozen plasma     Status: None (Preliminary result)   Collection Time: 07/13/14  5:44 PM  Result Value Ref Range   Unit Number [id number]    Blood Component Type THAWED PLASMA    Unit division 00    Status of Unit ISSUED,FINAL    Transfusion Status OK TO TRANSFUSE    Unit Number [id number]    Blood Component Type THAWED PLASMA    Unit division 00    Status of Unit ISSUED    Transfusion Status OK TO TRANSFUSE   Calcium, ionized     Status: Abnormal   Collection Time: 07/13/14  6:00 PM  Result Value Ref Range   Calcium, Ion 0.97 (L) 1.12 - 1.32 mmol/L    Comment: Performed at [facility]     Status: Abnormal   Collection Time: 07/13/14  6:00 PM  Result Value Ref Range   Prothrombin Time 17.5 (H) 11.6 - 15.2 seconds   INR 1.42 0.00 - 1.49  APTT     Status: Abnormal   Collection Time: 07/13/14  6:00 PM  Result Value Ref Range   aPTT 40 (H) 24 - 37 seconds    Comment:        IF BASELINE aPTT IS ELEVATED, SUGGEST PATIENT RISK ASSESSMENT BE USED TO DETERMINE APPROPRIATE ANTICOAGULANT THERAPY.   Fibrinogen     Status: None   Collection Time: 07/13/14  6:00 PM  Result Value Ref Range   Fibrinogen 241 204 - 475 mg/dL  CBC     Status: Abnormal   Collection Time: 07/13/14  7:39 PM  Result Value Ref Range   WBC 6.7 4.0 - 10.5 K/uL   RBC 2.41 (L) 4.22 - 5.81 MIL/uL   Hemoglobin 7.2 (L) 13.0 - 17.0 g/dL   HCT 20.9 (L) 39.0 - 52.0 %   MCV 86.7 78.0 - 100.0 fL   MCH 29.9 26.0 - 34.0 pg   MCHC 34.4 30.0 - 36.0 g/dL   RDW 16.2 (H) 11.5 - 15.5 %   Platelets 46 (L) 150 - 400 K/uL    Comment: CONSISTENT WITH PREVIOUS RESULT  Protime-INR     Status: Abnormal   Collection Time: 07/13/14  7:39 PM  Result Value Ref Range   Prothrombin Time 17.5 (H) 11.6 - 15.2 seconds   INR 1.42 0.00 - 1.49  Blood gas, arterial     Status: Abnormal   Collection Time: 07/13/14  7:45 PM  Result Value Ref Range   FIO2 0.50 %   Delivery systems VENTILATOR    Mode PRESSURE  REGULATED VOLUME CONTROL    VT 550 mL   Rate 20 resp/min   Peep/cpap 5.0 cm H20   pH, Arterial 7.412 7.350 - 7.450  pCO2 arterial 38.2 35.0 - 45.0 mmHg   pO2, Arterial 209.0 (H) 80.0 - 100.0 mmHg   Bicarbonate 24.6 (H) 20.0 - 24.0 mEq/L   TCO2 23.8 0 - 100 mmol/L   Acid-Base Excess 0.0 0.0 - 2.0 mmol/L   O2 Saturation 99.9 %   Patient temperature 94.3    Collection site ARTERIAL LINE    Drawn by [id number]    Sample type ARTERIAL    Allens test (pass/fail) PASS PASS  CBC     Status: Abnormal   Collection Time: 07/13/14 10:58 PM  Result Value Ref Range   WBC 5.1 4.0 - 10.5 K/uL   RBC 2.04 (L) 4.22 - 5.81 MIL/uL   Hemoglobin 6.0 (LL) 13.0 - 17.0 g/dL    Comment: CRITICAL RESULT CALLED TO, READ BACK BY AND VERIFIED WITH: B BATES RN 2345 07/13/14 A NAVARRO    HCT 17.6 (L) 39.0 - 52.0 %   MCV 86.3 78.0 - 100.0 fL   MCH 29.4 26.0 - 34.0 pg   MCHC 34.1 30.0 - 36.0 g/dL   RDW 16.2 (H) 11.5 - 15.5 %   Platelets 90 (L) 150 - 400 K/uL    Comment: DELTA CHECK NOTED POST TRANSFUSION SPECIMEN   Basic metabolic panel     Status: Abnormal   Collection Time: 07/13/14 10:58 PM  Result Value Ref Range   Sodium 141 135 - 145 mmol/L   Potassium 3.7 3.5 - 5.1 mmol/L   Chloride 108 96 - 112 mmol/L   CO2 27 19 - 32 mmol/L   Glucose, Bld 194 (H) 70 - 99 mg/dL   BUN 25 (H) 6 - 23 mg/dL   Creatinine, Ser 1.52 (H) 0.50 - 1.35 mg/dL   Calcium 6.7 (L) 8.4 - 10.5 mg/dL   GFR calc non Af Amer 45 (L) >90 mL/min   GFR calc Af Amer 52 (L) >90 mL/min    Comment: (NOTE) The eGFR has been calculated using the CKD EPI equation. This calculation has not been validated in all clinical situations. eGFR's persistently <90 mL/min signify possible Chronic Kidney Disease.    Anion gap 6 5 - 15  Prepare RBC     Status: None   Collection Time: 07/13/14 11:58 PM  Result Value Ref Range   Order Confirmation ORDER PROCESSED BY BLOOD BANK   Basic metabolic panel     Status: Abnormal   Collection Time: 07/14/14   6:10 AM  Result Value Ref Range   Sodium 142 135 - 145 mmol/L   Potassium 3.9 3.5 - 5.1 mmol/L   Chloride 108 96 - 112 mmol/L   CO2 30 19 - 32 mmol/L   Glucose, Bld 119 (H) 70 - 99 mg/dL   BUN 26 (H) 6 - 23 mg/dL   Creatinine, Ser 1.59 (H) 0.50 - 1.35 mg/dL   Calcium 6.6 (L) 8.4 - 10.5 mg/dL   GFR calc non Af Amer 43 (L) >90 mL/min   GFR calc Af Amer 49 (L) >90 mL/min    Comment: (NOTE) The eGFR has been calculated using the CKD EPI equation. This calculation has not been validated in all clinical situations. eGFR's persistently <90 mL/min signify possible Chronic Kidney Disease.    Anion gap 4 (L) 5 - 15  Magnesium     Status: Abnormal   Collection Time: 07/14/14  6:10 AM  Result Value Ref Range   Magnesium 1.3 (L) 1.5 - 2.5 mg/dL  Phosphorus     Status: None   Collection Time: 07/14/14    6:10 AM  Result Value Ref Range   Phosphorus 3.6 2.3 - 4.6 mg/dL  Calcium, ionized     Status: Abnormal   Collection Time: 07/14/14  6:10 AM  Result Value Ref Range   Calcium, Ion 0.98 (L) 1.12 - 1.32 mmol/L    Comment: Performed at [facility]  APTT     Status: None   Collection Time: 07/14/14  6:10 AM  Result Value Ref Range   aPTT 34 24 - 37 seconds  Fibrinogen     Status: None   Collection Time: 07/14/14  6:10 AM  Result Value Ref Range   Fibrinogen 269 204 - 475 mg/dL  CBC     Status: Abnormal   Collection Time: 07/14/14  6:10 AM  Result Value Ref Range   WBC 5.2 4.0 - 10.5 K/uL   RBC 2.54 (L) 4.22 - 5.81 MIL/uL   Hemoglobin 7.6 (L) 13.0 - 17.0 g/dL    Comment: REPEATED TO VERIFY DELTA CHECK NOTED POST TRANSFUSION SPECIMEN    HCT 21.9 (L) 39.0 - 52.0 %   MCV 86.2 78.0 - 100.0 fL   MCH 29.9 26.0 - 34.0 pg   MCHC 34.7 30.0 - 36.0 g/dL   RDW 15.5 11.5 - 15.5 %   Platelets 115 (L) 150 - 400 K/uL    Comment: CONSISTENT WITH PREVIOUS RESULT  Hemoglobin and hematocrit, blood     Status: Abnormal   Collection Time: 07/14/14 10:55 AM  Result Value Ref Range    Hemoglobin 7.7 (L) 13.0 - 17.0 g/dL   HCT 21.8 (L) 39.0 - 52.0 %  Calcium, ionized     Status: Abnormal   Collection Time: 07/14/14 10:55 AM  Result Value Ref Range   Calcium, Ion 0.99 (L) 1.12 - 1.32 mmol/L    Comment: Performed at [facility]  APTT     Status: None   Collection Time: 07/14/14 10:55 AM  Result Value Ref Range   aPTT 33 24 - 37 seconds  Fibrinogen     Status: None   Collection Time: 07/14/14 10:55 AM  Result Value Ref Range   Fibrinogen 312 204 - 475 mg/dL  Protime-INR     Status: Abnormal   Collection Time: 07/14/14 10:55 AM  Result Value Ref Range   Prothrombin Time 15.3 (H) 11.6 - 15.2 seconds   INR 1.20 0.00 - 1.49  Calcium, ionized     Status: Abnormal   Collection Time: 07/14/14  6:30 PM  Result Value Ref Range   Calcium, Ion 1.05 (L) 1.12 - 1.32 mmol/L    Comment: Performed at [facility]  APTT     Status: None   Collection Time: 07/14/14  6:30 PM  Result Value Ref Range   aPTT 36 24 - 37 seconds  Fibrinogen     Status: None   Collection Time: 07/14/14  6:30 PM  Result Value Ref Range   Fibrinogen 413 204 - 475 mg/dL  CBC     Status: Abnormal   Collection Time: 07/14/14  6:30 PM  Result Value Ref Range   WBC 7.0 4.0 - 10.5 K/uL   RBC 2.64 (L) 4.22 - 5.81 MIL/uL   Hemoglobin 7.9 (L) 13.0 - 17.0 g/dL   HCT 22.8 (L) 39.0 - 52.0 %   MCV 86.4 78.0 - 100.0 fL   MCH 29.9 26.0 - 34.0 pg   MCHC 34.6 30.0 - 36.0 g/dL   RDW 15.9 (H) 11.5 - 15.5 %   Platelets 110 (  L) 150 - 400 K/uL    Comment: REPEATED TO VERIFY SPECIMEN CHECKED FOR CLOTS PLATELET COUNT CONFIRMED BY SMEAR   CBC     Status: Abnormal   Collection Time: 07/14/14 10:26 PM  Result Value Ref Range   WBC 7.0 4.0 - 10.5 K/uL   RBC 2.49 (L) 4.22 - 5.81 MIL/uL   Hemoglobin 7.6 (L) 13.0 - 17.0 g/dL   HCT 21.6 (L) 39.0 - 52.0 %   MCV 86.7 78.0 - 100.0 fL   MCH 30.5 26.0 - 34.0 pg   MCHC 35.2 30.0 - 36.0 g/dL   RDW 15.9 (H) 11.5 - 15.5 %   Platelets 113 (L) 150 - 400 K/uL     Comment: CONSISTENT WITH PREVIOUS RESULT  Calcium, ionized     Status: Abnormal   Collection Time: 07/15/14 12:58 AM  Result Value Ref Range   Calcium, Ion 1.06 (L) 1.12 - 1.32 mmol/L    Comment: Performed at [facility]  APTT     Status: None   Collection Time: 07/15/14 12:58 AM  Result Value Ref Range   aPTT 31 24 - 37 seconds  Fibrinogen     Status: None   Collection Time: 07/15/14 12:58 AM  Result Value Ref Range   Fibrinogen 379 204 - 475 mg/dL  CBC     Status: Abnormal   Collection Time: 07/15/14 12:58 AM  Result Value Ref Range   WBC 7.0 4.0 - 10.5 K/uL   RBC 2.46 (L) 4.22 - 5.81 MIL/uL   Hemoglobin 7.4 (L) 13.0 - 17.0 g/dL   HCT 21.4 (L) 39.0 - 52.0 %   MCV 87.0 78.0 - 100.0 fL   MCH 30.1 26.0 - 34.0 pg   MCHC 34.6 30.0 - 36.0 g/dL   RDW 15.9 (H) 11.5 - 15.5 %   Platelets 114 (L) 150 - 400 K/uL    Comment: CONSISTENT WITH PREVIOUS RESULT  APTT     Status: None   Collection Time: 07/15/14  5:15 AM  Result Value Ref Range   aPTT 33 24 - 37 seconds  Fibrinogen     Status: None   Collection Time: 07/15/14  5:15 AM  Result Value Ref Range   Fibrinogen 411 204 - 475 mg/dL  CBC     Status: Abnormal   Collection Time: 07/15/14  5:16 AM  Result Value Ref Range   WBC 7.1 4.0 - 10.5 K/uL   RBC 2.37 (L) 4.22 - 5.81 MIL/uL   Hemoglobin 7.2 (L) 13.0 - 17.0 g/dL   HCT 20.8 (L) 39.0 - 52.0 %   MCV 87.8 78.0 - 100.0 fL   MCH 30.4 26.0 - 34.0 pg   MCHC 34.6 30.0 - 36.0 g/dL   RDW 16.0 (H) 11.5 - 15.5 %   Platelets 107 (L) 150 - 400 K/uL    Comment: CONSISTENT WITH PREVIOUS RESULT  Basic metabolic panel     Status: Abnormal   Collection Time: 07/15/14  5:16 AM  Result Value Ref Range   Sodium 141 135 - 145 mmol/L   Potassium 3.6 3.5 - 5.1 mmol/L   Chloride 110 96 - 112 mmol/L   CO2 28 19 - 32 mmol/L   Glucose, Bld 111 (H) 70 - 99 mg/dL   BUN 30 (H) 6 - 23 mg/dL   Creatinine, Ser 1.64 (H) 0.50 - 1.35 mg/dL   Calcium 6.9 (L) 8.4 - 10.5 mg/dL   GFR calc non  Af Amer 41 (L) >90 mL/min  GFR calc Af Amer 48 (L) >90 mL/min    Comment: (NOTE) The eGFR has been calculated using the CKD EPI equation. This calculation has not been validated in all clinical situations. eGFR's persistently <90 mL/min signify possible Chronic Kidney Disease.    Anion gap 3 (L) 5 - 15    Imaging / Studies: Dg Chest Port 1 View  07/13/2014   CLINICAL DATA:  Central line placement.  EXAM: PORTABLE CHEST - 1 VIEW  COMPARISON:  02/03/2014  FINDINGS: Right internal jugular central venous line tip projects in the lower superior vena cava just above the caval atrial junction. There is no pneumothorax.  Endotracheal tube tip projects 2.8 cm above the chronic, well positioned. Nasogastric tube is also well-positioned passing well below the diaphragm into the stomach.  There is central interstitial thickening, but no focal lung consolidation. No pleural effusion or pneumothorax. Cardiac silhouette is normal in size. No Theresa Wedel mediastinal or hilar masses.  IMPRESSION: 1. Central venous line and endotracheal tube and nasogastric tube are well positioned as described. No pneumothorax. 2. Central interstitial thickening. This is exaggerated by low lung volumes. This could reflect diffuse interstitial infiltrate or interstitial edema.   Electronically Signed   By: Lajean Manes M.D.   On: 07/13/2014 16:24    Medications / Allergies: per chart  Antibiotics: Anti-infectives    Start     Dose/Rate Route Frequency Ordered Stop   07/13/14 1800  ceFAZolin (ANCEF) IVPB 1 g/50 mL premix     1 g 100 mL/hr over 30 Minutes Intravenous Every 6 hours 07/13/14 1618 07/14/14 0648   07/13/14 0547  ceFAZolin (ANCEF) IVPB 2 g/50 mL premix     2 g 100 mL/hr over 30 Minutes Intravenous On call to O.R. 07/13/14 0547 07/13/14 1155       Note: Portions of this report may have been transcribed using voice recognition software. Every effort was made to ensure accuracy; however, inadvertent computerized  transcription errors may be present.   Any transcriptional errors that result from this process are unintentional.     [name], M.D., F.A.C.S. Gastrointestinal and Minimally Invasive Surgery [facility] [address], [place], [place] [address] [phone] Main / Paging   07/15/2014

## 2014-07-15 NOTE — Evaluation (Signed)
Physical Therapy Evaluation Patient Details Name: Darryl Griffith MRN: [id number] DOB: November 19, 1945 Today's Date: 07/15/2014   History of Present Illness  69 yo male s/p resection/L nephrectomy 07/13/14.   Clinical Impression  On eval, pt required Mod assist +2 for bed mobility, Min assist +2 for ambulation ~250 feet with RW. Remained on [place] O2 during ambulation (sats were 87% on RA when therapy entered room). Pt tolerated activity well. Rated pain 5/10 with activity. Therapy will follow during hospital stay.     Follow Up Recommendations Home health PT;No PT follow up (depending on progress)    Equipment Recommendations   (to be determined)    Recommendations for Other Services       Precautions / Restrictions Precautions Precaution Comments: lines/leads/O2, drains. abdominal surgery Restrictions Weight Bearing Restrictions: No      Mobility  Bed Mobility Overal bed mobility: Needs Assistance Bed Mobility: Supine to Sit     Supine to sit: Mod assist;+2 for physical assistance;+2 for safety/equipment;HOB elevated     General bed mobility comments: Assist for trunk/LEs. Increased time.   Transfers Overall transfer level: Needs assistance Equipment used: Rolling walker (2 wheeled) Transfers: Sit to/from Stand Sit to Stand: Mod assist;+2 safety/equipment         General transfer comment: Assist to rise, stabilize, control descent. VCs safety, hand placement  Ambulation/Gait Ambulation/Gait assistance: Min assist;+2 safety/equipment Ambulation Distance (Feet): 250 Feet Assistive device: Rolling walker (2 wheeled) Gait Pattern/deviations: Step-through pattern     General Gait Details: Assist to stabilize. Followed with recliner. Remained on [place] O2 during ambulation. Pt tolerated distance well.   Stairs            Wheelchair Mobility    Modified Rankin (Stroke Patients Only)       Balance Overall balance assessment: Needs assistance         Standing  balance support: Bilateral upper extremity supported;During functional activity Standing balance-Leahy Scale: Poor                               Pertinent Vitals/Pain Pain Assessment: 0-10 Pain Score: 5  Pain Location: abdomen Pain Intervention(s): Monitored during session;PCA encouraged    Home Living Family/patient expects to be discharged to:: Private residence Living Arrangements: Spouse/significant other Available Help at Discharge: Family Type of Home: Apartment Home Access: Stairs to enter   Technical brewer of Steps: 1 Home Layout: One level Home Equipment: None      Prior Function Level of Independence: Independent               Hand Dominance        Extremity/Trunk Assessment               Lower Extremity Assessment: Generalized weakness      Cervical / Trunk Assessment: Normal  Communication   Communication: No difficulties  Cognition Arousal/Alertness: Awake/alert Behavior During Therapy: WFL for tasks assessed/performed Overall Cognitive Status: Within Functional Limits for tasks assessed                      General Comments      Exercises        Assessment/Plan    PT Assessment Patient needs continued PT services  PT Diagnosis Difficulty walking;Generalized weakness;Acute pain   PT Problem List Decreased strength;Decreased activity tolerance;Decreased mobility;Pain  PT Treatment Interventions Functional mobility training;Therapeutic activities;Therapeutic exercise;Patient/family education;Gait training;Balance training   PT Goals (Current goals can  be found in the Care Plan section) Acute Rehab PT Goals Patient Stated Goal: less pain. regain independence PT Goal Formulation: With patient Time For Goal Achievement: 07/29/14 Potential to Achieve Goals: Good    Frequency Min 3X/week   Barriers to discharge        Co-evaluation               End of Session Equipment Utilized During  Treatment: Oxygen Activity Tolerance: Patient tolerated treatment well Patient left: in chair;with call bell/phone within reach;with family/visitor present           Time: 9470-9628 PT Time Calculation (min) (ACUTE ONLY): 32 min   Charges:   PT Evaluation $Initial PT Evaluation Tier I: 1 Procedure PT Treatments $Gait Training: 8-22 mins   PT G Codes:        Weston Anna, MPT Pager: [phone]

## 2014-07-16 LAB — CBC
HCT: 21.2 % — ABNORMAL LOW (ref 39.0–52.0)
Hemoglobin: 7.2 g/dL — ABNORMAL LOW (ref 13.0–17.0)
MCH: 30.4 pg (ref 26.0–34.0)
MCHC: 34 g/dL (ref 30.0–36.0)
MCV: 89.5 fL (ref 78.0–100.0)
Platelets: 150 10*3/uL (ref 150–400)
RBC: 2.37 MIL/uL — ABNORMAL LOW (ref 4.22–5.81)
RDW: 15.2 % (ref 11.5–15.5)
WBC: 8.7 10*3/uL (ref 4.0–10.5)

## 2014-07-16 LAB — MAGNESIUM: Magnesium: 2 mg/dL (ref 1.5–2.5)

## 2014-07-16 LAB — BASIC METABOLIC PANEL
Anion gap: 5 (ref 5–15)
BUN: 32 mg/dL — AB (ref 6–23)
CO2: 28 mmol/L (ref 19–32)
CREATININE: 1.65 mg/dL — AB (ref 0.50–1.35)
Calcium: 7.6 mg/dL — ABNORMAL LOW (ref 8.4–10.5)
Chloride: 110 mmol/L (ref 96–112)
GFR, EST AFRICAN AMERICAN: 47 mL/min — AB (ref >90)
GFR, EST NON AFRICAN AMERICAN: 41 mL/min — AB (ref >90)
Glucose, Bld: 91 mg/dL (ref 70–99)
Potassium: 3.6 mmol/L (ref 3.5–5.1)
SODIUM: 143 mmol/L (ref 135–145)

## 2014-07-16 LAB — PROTIME-INR
INR: 1.21 (ref 0.00–1.49)
PROTHROMBIN TIME: 15.4 s — AB (ref 11.6–15.2)

## 2014-07-16 LAB — FIBRINOGEN: Fibrinogen: 573 mg/dL — ABNORMAL HIGH (ref 204–475)

## 2014-07-16 MED ORDER — FUROSEMIDE 10 MG/ML IJ SOLN
40.0000 mg | Freq: Once | INTRAMUSCULAR | Status: AC
  Administered 2014-07-16: 40 mg via INTRAVENOUS
  Filled 2014-07-16: qty 4

## 2014-07-16 NOTE — Progress Notes (Signed)
[facility]  [address] [place], [place] [address] Phone: [phone] FAX: [phone]    Darryl Griffith [id number] 1946/02/12  CARE TEAM:  PCP: Gennette Pac, MD  Outpatient Care Team: Patient Care Team: [name], MD as PCP - General (Family Medicine) Erroll Luna, MD as Consulting Physician (General Surgery)  Inpatient Treatment Team: Treatment Team: Attending Provider: Stark Klein, MD; Registered Nurse: Carl Best, RN; Registered Nurse: Meredith Mody, RN; Registered Nurse: Philis Pique, RN; Registered Nurse: Orma Render, RN; Registered Nurse: Lenice Pressman, RN  Problem List:   Principal Problem:   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  May need additional transfusion, but less likely now.  Keep drain & follow  Acute renal insufficiency - Resloving.  Left kidney removed with retroperitoneal mass.  Stable with good UOP.  Give lasix again given +17L (in theory).  PRN IVF boluses if UOP falls  DIC - appears resolved. Follow  NPO/NGT clamping trial with clears.  Continue foley due to strict I&O, patient critically ill, patient in ICU and urinary output monitoring.  Poss d/c tomorrow if improving & more mobile  PCA Dilaudid.  OnQ done - d/c.  Ice/Heat.  Switch to PO meds once ileus  resolves  Transfer to Floor since better.  SDU RNs agree  HTN off pressors.  Try diuresis gently.  f/u pathology  VTE prophylaxis- SCDs only.  No anticoagulation w coagulopathy/DIC from massive EBL.  Maybe 4/19 if better / stable...  mobilize as tolerated to help recovery.  Get him up in stepdown & follow  [name], M.D., F.A.C.S. Gastrointestinal and Minimally Invasive Surgery [facility] [address], [place], [place] [address] [phone] Main / Paging   07/16/2014  Subjective:  Sore - PCA helping Some flatus ICU RNs nearby  Objective:  Vital signs:  Filed Vitals:   07/16/14 0000 07/16/14 0200 07/16/14 0400 07/16/14 0600  BP: 167/71 160/69 164/66 161/67  Pulse: 105 100 93 95  Temp: 98.7 F (37.1 C)  99.1 F (37.3 C)   TempSrc: Axillary  Oral   Resp: 17 18 18 18   Height:      Weight:      SpO2: 91% 89% 90% 91%    Last BM Date: 07/14/14  Intake/Output   Yesterday:  04/16 0701 - 04/17 0700 In: 1837.7 [P.O.:510; I.V.:1327.7] Out: 4190 [Urine:2940; Emesis/NG output:800; Drains:450] This shift:     Bowel function:  Flatus: Yes  BM: n  NGT thinly bilious - net ~310m out Drain: thinner -  serosang  Physical Exam:  General: Pt awake/alert/oriented x4 in mild acute distress Eyes: PERRL, normal EOM.  Sclera clear.  No icterus Neuro: CN II-XII intact w/o focal sensory/motor deficits. Lymph: No head/neck/groin lymphadenopathy Psych:  No delerium/psychosis/paranoia HENT: Normocephalic, Mucus membranes moist.  No thrush Neck: Supple, No tracheal deviation Chest: No chest wall pain w good excursion CV:  Pulses intact.  Regular rhythm MS: Normal AROM mjr joints.  No obvious deformity Abdomen: Obese.  Softer.  Nondistended.  Mod tender at incisions only.  No evidence of peritonitis.  No incarcerated hernias. Ext:  SCDs BLE.  3+ anasarca.  No cyanosis Skin: No petechiae / purpura  Results:   Labs: Results  for orders placed or performed during the hospital encounter of 07/13/14 (from the past 48 hour(s))  Hemoglobin and hematocrit, blood     Status: Abnormal   Collection Time: 07/14/14 10:55 AM  Result Value Ref Range   Hemoglobin 7.7 (L) 13.0 - 17.0 g/dL   HCT 21.8 (L) 39.0 - 52.0 %  Calcium, ionized     Status: Abnormal   Collection Time: 07/14/14 10:55 AM  Result Value Ref Range   Calcium, Ion 0.99 (L) 1.12 - 1.32 mmol/L    Comment: Performed at [facility]  APTT     Status: None   Collection Time: 07/14/14 10:55 AM  Result Value Ref Range   aPTT 33 24 - 37 seconds  Fibrinogen     Status: None   Collection Time: 07/14/14 10:55 AM  Result Value Ref Range   Fibrinogen 312 204 - 475 mg/dL  Protime-INR     Status: Abnormal   Collection Time: 07/14/14 10:55 AM  Result Value Ref Range   Prothrombin Time 15.3 (H) 11.6 - 15.2 seconds   INR 1.20 0.00 - 1.49  Calcium, ionized     Status: Abnormal   Collection Time: 07/14/14  6:30 PM  Result Value Ref Range   Calcium, Ion 1.05 (L) 1.12 - 1.32 mmol/L    Comment: Performed at [facility]  APTT     Status: None   Collection Time: 07/14/14  6:30 PM  Result Value Ref Range   aPTT 36 24 - 37 seconds  Fibrinogen     Status: None   Collection Time: 07/14/14  6:30 PM  Result Value Ref Range   Fibrinogen 413 204 - 475 mg/dL  CBC     Status: Abnormal   Collection Time: 07/14/14  6:30 PM  Result Value Ref Range   WBC 7.0 4.0 - 10.5 K/uL   RBC 2.64 (L) 4.22 - 5.81 MIL/uL   Hemoglobin 7.9 (L) 13.0 - 17.0 g/dL   HCT 22.8 (L) 39.0 - 52.0 %   MCV 86.4 78.0 - 100.0 fL   MCH 29.9 26.0 - 34.0 pg   MCHC 34.6 30.0 - 36.0 g/dL   RDW 15.9 (H) 11.5 - 15.5 %   Platelets 110 (L) 150 - 400 K/uL    Comment: REPEATED TO VERIFY SPECIMEN CHECKED FOR CLOTS PLATELET COUNT CONFIRMED BY SMEAR   CBC     Status: Abnormal   Collection Time: 07/14/14 10:26 PM  Result Value Ref Range   WBC 7.0 4.0 - 10.5 K/uL   RBC 2.49 (L) 4.22 - 5.81 MIL/uL    Hemoglobin 7.6 (L) 13.0 - 17.0 g/dL   HCT 21.6 (L) 39.0 - 52.0 %   MCV 86.7 78.0 - 100.0 fL   MCH 30.5 26.0 - 34.0 pg   MCHC 35.2 30.0 - 36.0 g/dL   RDW 15.9 (H) 11.5 - 15.5 %   Platelets 113 (L) 150 - 400 K/uL    Comment: CONSISTENT WITH PREVIOUS RESULT  Calcium, ionized     Status: Abnormal   Collection Time: 07/15/14 12:58 AM  Result Value Ref Range   Calcium, Ion 1.06 (L) 1.12 - 1.32 mmol/L  Comment: Performed at [facility]  APTT     Status: None   Collection Time: 07/15/14 12:58 AM  Result Value Ref Range   aPTT 31 24 - 37 seconds  Fibrinogen     Status: None   Collection Time: 07/15/14 12:58 AM  Result Value Ref Range   Fibrinogen 379 204 - 475 mg/dL  CBC     Status: Abnormal   Collection Time: 07/15/14 12:58 AM  Result Value Ref Range   WBC 7.0 4.0 - 10.5 K/uL   RBC 2.46 (L) 4.22 - 5.81 MIL/uL   Hemoglobin 7.4 (L) 13.0 - 17.0 g/dL   HCT 21.4 (L) 39.0 - 52.0 %   MCV 87.0 78.0 - 100.0 fL   MCH 30.1 26.0 - 34.0 pg   MCHC 34.6 30.0 - 36.0 g/dL   RDW 15.9 (H) 11.5 - 15.5 %   Platelets 114 (L) 150 - 400 K/uL    Comment: CONSISTENT WITH PREVIOUS RESULT  Calcium, ionized     Status: Abnormal   Collection Time: 07/15/14  5:14 AM  Result Value Ref Range   Calcium, Ion 1.06 (L) 1.12 - 1.32 mmol/L    Comment: Performed at [facility]  APTT     Status: None   Collection Time: 07/15/14  5:15 AM  Result Value Ref Range   aPTT 33 24 - 37 seconds  Fibrinogen     Status: None   Collection Time: 07/15/14  5:15 AM  Result Value Ref Range   Fibrinogen 411 204 - 475 mg/dL  CBC     Status: Abnormal   Collection Time: 07/15/14  5:16 AM  Result Value Ref Range   WBC 7.1 4.0 - 10.5 K/uL   RBC 2.37 (L) 4.22 - 5.81 MIL/uL   Hemoglobin 7.2 (L) 13.0 - 17.0 g/dL   HCT 20.8 (L) 39.0 - 52.0 %   MCV 87.8 78.0 - 100.0 fL   MCH 30.4 26.0 - 34.0 pg   MCHC 34.6 30.0 - 36.0 g/dL   RDW 16.0 (H) 11.5 - 15.5 %   Platelets 107 (L) 150 - 400 K/uL    Comment: CONSISTENT  WITH PREVIOUS RESULT  Basic metabolic panel     Status: Abnormal   Collection Time: 07/15/14  5:16 AM  Result Value Ref Range   Sodium 141 135 - 145 mmol/L   Potassium 3.6 3.5 - 5.1 mmol/L   Chloride 110 96 - 112 mmol/L   CO2 28 19 - 32 mmol/L   Glucose, Bld 111 (H) 70 - 99 mg/dL   BUN 30 (H) 6 - 23 mg/dL   Creatinine, Ser 1.64 (H) 0.50 - 1.35 mg/dL   Calcium 6.9 (L) 8.4 - 10.5 mg/dL   GFR calc non Af Amer 41 (L) >90 mL/min   GFR calc Af Amer 48 (L) >90 mL/min    Comment: (NOTE) The eGFR has been calculated using the CKD EPI equation. This calculation has not been validated in all clinical situations. eGFR's persistently <90 mL/min signify possible Chronic Kidney Disease.    Anion gap 3 (L) 5 - 15  Hemoglobin     Status: Abnormal   Collection Time: 07/15/14  8:10 PM  Result Value Ref Range   Hemoglobin 7.3 (L) 13.0 - 17.0 g/dL  Fibrinogen     Status: Abnormal   Collection Time: 07/16/14  1:15 AM  Result Value Ref Range   Fibrinogen 573 (H) 204 - 475 mg/dL  CBC     Status: Abnormal  Collection Time: 07/16/14  5:25 AM  Result Value Ref Range   WBC 8.7 4.0 - 10.5 K/uL   RBC 2.37 (L) 4.22 - 5.81 MIL/uL   Hemoglobin 7.2 (L) 13.0 - 17.0 g/dL   HCT 21.2 (L) 39.0 - 52.0 %   MCV 89.5 78.0 - 100.0 fL   MCH 30.4 26.0 - 34.0 pg   MCHC 34.0 30.0 - 36.0 g/dL   RDW 15.2 11.5 - 15.5 %   Platelets 150 150 - 400 K/uL    Comment: DELTA CHECK NOTED REPEATED TO VERIFY   Basic metabolic panel     Status: Abnormal   Collection Time: 07/16/14  5:25 AM  Result Value Ref Range   Sodium 143 135 - 145 mmol/L   Potassium 3.6 3.5 - 5.1 mmol/L   Chloride 110 96 - 112 mmol/L   CO2 28 19 - 32 mmol/L   Glucose, Bld 91 70 - 99 mg/dL   BUN 32 (H) 6 - 23 mg/dL   Creatinine, Ser 1.65 (H) 0.50 - 1.35 mg/dL   Calcium 7.6 (L) 8.4 - 10.5 mg/dL   GFR calc non Af Amer 41 (L) >90 mL/min   GFR calc Af Amer 47 (L) >90 mL/min    Comment: (NOTE) The eGFR has been calculated using the CKD EPI  equation. This calculation has not been validated in all clinical situations. eGFR's persistently <90 mL/min signify possible Chronic Kidney Disease.    Anion gap 5 5 - 15  Magnesium     Status: None   Collection Time: 07/16/14  5:25 AM  Result Value Ref Range   Magnesium 2.0 1.5 - 2.5 mg/dL  Protime-INR     Status: Abnormal   Collection Time: 07/16/14  7:00 AM  Result Value Ref Range   Prothrombin Time 15.4 (H) 11.6 - 15.2 seconds   INR 1.21 0.00 - 1.49    Imaging / Studies: No results found.  Medications / Allergies: per chart  Antibiotics: Anti-infectives    Start     Dose/Rate Route Frequency Ordered Stop   07/13/14 1800  ceFAZolin (ANCEF) IVPB 1 g/50 mL premix     1 g 100 mL/hr over 30 Minutes Intravenous Every 6 hours 07/13/14 1618 07/14/14 0648   07/13/14 0547  ceFAZolin (ANCEF) IVPB 2 g/50 mL premix     2 g 100 mL/hr over 30 Minutes Intravenous On call to O.R. 07/13/14 0547 07/13/14 1155       Note: Portions of this report may have been transcribed using voice recognition software. Every effort was made to ensure accuracy; however, inadvertent computerized transcription errors may be present.   Any transcriptional errors that result from this process are unintentional.     [name], M.D., F.A.C.S. Gastrointestinal and Minimally Invasive Surgery [facility] [address] [place], [place] [address] [phone] Main / Paging   07/16/2014

## 2014-07-17 ENCOUNTER — Encounter (HOSPITAL_COMMUNITY): Payer: Self-pay | Admitting: General Surgery

## 2014-07-17 LAB — TYPE AND SCREEN
ABO/RH(D): O NEG
Antibody Screen: NEGATIVE
UNIT DIVISION: 0
UNIT DIVISION: 0
UNIT DIVISION: 0
UNIT DIVISION: 0
UNIT DIVISION: 0
UNIT DIVISION: 0
Unit division: 0
Unit division: 0
Unit division: 0
Unit division: 0
Unit division: 0
Unit division: 0
Unit division: 0
Unit division: 0
Unit division: 0
Unit division: 0
Unit division: 0
Unit division: 0
Unit division: 0
Unit division: 0

## 2014-07-17 LAB — PREPARE FRESH FROZEN PLASMA
UNIT DIVISION: 0
Unit division: 0

## 2014-07-17 LAB — BASIC METABOLIC PANEL
ANION GAP: 3 — AB (ref 5–15)
BUN: 33 mg/dL — ABNORMAL HIGH (ref 6–23)
CALCIUM: 7.5 mg/dL — AB (ref 8.4–10.5)
CO2: 29 mmol/L (ref 19–32)
CREATININE: 1.72 mg/dL — AB (ref 0.50–1.35)
Chloride: 106 mmol/L (ref 96–112)
GFR calc non Af Amer: 39 mL/min — ABNORMAL LOW (ref >90)
GFR, EST AFRICAN AMERICAN: 45 mL/min — AB (ref >90)
Glucose, Bld: 95 mg/dL (ref 70–99)
Potassium: 3.3 mmol/L — ABNORMAL LOW (ref 3.5–5.1)
Sodium: 138 mmol/L (ref 135–145)

## 2014-07-17 LAB — CBC
HCT: 22.2 % — ABNORMAL LOW (ref 39.0–52.0)
HEMOGLOBIN: 7.3 g/dL — AB (ref 13.0–17.0)
MCH: 29.7 pg (ref 26.0–34.0)
MCHC: 32.9 g/dL (ref 30.0–36.0)
MCV: 90.2 fL (ref 78.0–100.0)
PLATELETS: 236 10*3/uL (ref 150–400)
RBC: 2.46 MIL/uL — ABNORMAL LOW (ref 4.22–5.81)
RDW: 14.9 % (ref 11.5–15.5)
WBC: 9.1 10*3/uL (ref 4.0–10.5)

## 2014-07-17 LAB — PREPARE PLATELET PHERESIS
UNIT DIVISION: 0
Unit division: 0

## 2014-07-17 LAB — HEMOGLOBIN: Hemoglobin: 7.3 g/dL — ABNORMAL LOW (ref 13.0–17.0)

## 2014-07-17 LAB — MAGNESIUM: Magnesium: 2.1 mg/dL (ref 1.5–2.5)

## 2014-07-17 MED ORDER — SODIUM CHLORIDE 0.9 % IJ SOLN
10.0000 mL | INTRAMUSCULAR | Status: DC | PRN
Start: 2014-07-17 — End: 2014-07-23
  Administered 2014-07-17: 10 mL
  Administered 2014-07-18: 20 mL
  Filled 2014-07-17: qty 40

## 2014-07-17 MED ORDER — POTASSIUM CHLORIDE 10 MEQ/100ML IV SOLN
10.0000 meq | INTRAVENOUS | Status: AC
  Administered 2014-07-17 (×5): 10 meq via INTRAVENOUS
  Filled 2014-07-17 (×5): qty 100

## 2014-07-17 NOTE — Progress Notes (Signed)
PT Cancellation Note  Patient Details Name: Darryl Griffith MRN: [id number] DOB: May 26, 1945   Cancelled Treatment:    Reason Eval/Treat Not Completed: Other (comment) (Pt stated he's walked in hall 2x today and wants to rest right now. Will follow. )   Philomena Doheny 07/17/2014, 1:53 PM [phone]

## 2014-07-17 NOTE — Care Management Note (Signed)
    Page 1 of 1   07/17/2014     9:53:20 AM CARE MANAGEMENT NOTE 07/17/2014  Patient:  Darryl Griffith, Darryl Griffith   Account Number:  [id number]  Date Initiated:  07/14/2014  Documentation initiated by:  DAVIS,RHONDA  Subjective/Objective Assessment:   69 yo male L retroperitoneal tumor involving renal arteries. Requiring L nephrectomy and mobilization of pancreas to resect. Complicated by massive blood loss.     Action/Plan:   home when stable   Anticipated DC Date:  07/17/2014   Anticipated DC Plan:  HOME/SELF CARE  In-house referral  NA      DC Planning Services  CM consult      Choice offered to / List presented to:             Status of service:  Completed, signed off Medicare Important Message given?  YES (If response is "NO", the following Medicare IM given date fields will be blank) Date Medicare IM given:  07/17/2014 Medicare IM given by:  [facility] Date Additional Medicare IM given:   Additional Medicare IM given by:    Discharge Disposition:  HOME/SELF CARE  Per UR Regulation:  Reviewed for med. necessity/level of care/duration of stay  If discussed at [place] of Stay Meetings, dates discussed:    Comments:  July 14, 2014/Rhonda L. Rosana Hoes, RN, BSN, CCM. Case Management [place] [phone] No discharge needs present of time of review. Vent day-1

## 2014-07-17 NOTE — Progress Notes (Signed)
Patient ID: Darryl Griffith, male   DOB: 07/30/1945, 69 y.o.   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  May need additional transfusion d/t tachycardia and low H&H, but will hold off for now d/t patient being asymptomatic.  Keep drain & follow.  Acute renal insufficiency - Resloving.  Left kidney removed with retroperitoneal mass.  Stable with good UOP. PRN IVF boluses if UOP falls. Slight bump in BUN/Cr, but most likely d/t Lasix given yesterday.  DIC - appears resolved. Follow.  Remove NG tube today. Pt denies N/V and is passing gas. Advance diet to clear liquids.  Hypokalemia - KCl given for 5 doses. Continue to monitor.  Continue foley to monitor  I&O.  Poss d/c tomorrow if improving & more mobile  PCA Dilaudid.  Ice/Heat.  Switch to PO meds once ileus resolves.  HTN off pressors. Diuresis helped lower BP some, but still slightly elevated. Continue to monitor. No further treatment for HTN at this point.  f/u pathology. No results yet, expect results this afternoon or tomorrow.  VTE prophylaxis- SCDs only.  No anticoagulation w coagulopathy/DIC from massive EBL.  Maybe 4/19 if better / stable...  Mobilize as tolerated to help recovery.   07/17/2014  Subjective:  Pt looking and feeling better today. Able to get up and walk around the floor with nurse assistance. Complaining of soreness in his left shoulder, PCA helping. Some flatus, but no BM. Denies N/V. Slight tachycardia ranging from 90's to 100's and improving but continued elevated BP, no fever.Foley in with good urine output. Drains with minimal serosanguinous fluid.  Objective:  Vital signs:  Filed Vitals:   07/17/14 0230 07/17/14 0306 07/17/14 0528 07/17/14 0825  BP: 138/65  145/69   Pulse: 102  91   Temp: 98.5 F (36.9 C)  98.5 F (36.9 C)   TempSrc: Oral  Oral   Resp: _0 Height:      Weight:      SpO2: 96% 96% 94% 94%    Last BM Date: 07/13/14  Intake/Output   Yesterday:  04/17 0701 - 04/18 0700 In: 2100 [P.O.:1440; I.V.:660] Out: 9038 [Urine:3550; Drains:240] This shift:     Bowel function:  Flatus: Yes  BM: No Drain: thinner -  serosang  Physical Exam:  General: Pt awake/alert/oriented x4 in mild acute distress Eyes: Sclera clear.  No icterus Psych:  No  delerium/psychosis/paranoia HENT: Normocephalic. Chest: No chest wall pain w good excursion CV:  Pulses intact.  Regular rhythm Resp: Breathing non-labored. Abdomen: Obese.  Softer.  Nondistended.  Mod tender at incisions only.  No evidence of peritonitis.  No incarcerated hernias. Ext:  SCDs BLE.  3+ anasarca.  No cyanosis Skin: No petechiae / purpura  Results:    Labs: Results for orders placed or performed during the hospital encounter of 07/13/14 (from the past 48 hour(s))  Hemoglobin     Status: Abnormal   Collection Time: 07/15/14  8:10 PM  Result Value Ref Range   Hemoglobin 7.3 (L) 13.0 - 17.0 g/dL  Fibrinogen     Status: Abnormal   Collection Time: 07/16/14  1:15 AM  Result Value Ref Range   Fibrinogen 573 (H) 204 - 475 mg/dL  CBC     Status: Abnormal   Collection Time: 07/16/14  5:25 AM  Result Value Ref Range   WBC 8.7 4.0 - 10.5 K/uL   RBC 2.37 (L) 4.22 - 5.81 MIL/uL   Hemoglobin 7.2 (L) 13.0 - 17.0 g/dL   HCT 21.2 (L) 39.0 - 52.0 %   MCV 89.5 78.0 - 100.0 fL   MCH 30.4 26.0 - 34.0 pg   MCHC 34.0 30.0 - 36.0 g/dL   RDW 15.2 11.5 - 15.5 %   Platelets 150 150 - 400 K/uL    Comment: DELTA CHECK NOTED REPEATED TO VERIFY   Basic metabolic panel     Status: Abnormal   Collection Time: 07/16/14  5:25 AM  Result Value Ref Range   Sodium 143 135 - 145 mmol/L   Potassium 3.6 3.5 - 5.1 mmol/L   Chloride 110 96 - 112 mmol/L   CO2 28 19 - 32 mmol/L   Glucose, Bld 91 70 - 99 mg/dL   BUN 32 (H) 6 - 23 mg/dL   Creatinine, Ser 1.65 (H) 0.50 - 1.35 mg/dL   Calcium 7.6 (L) 8.4 - 10.5 mg/dL   GFR calc non Af Amer 41 (L) >90 mL/min   GFR calc Af Amer 47 (L) >90 mL/min    Comment: (NOTE) The eGFR has been calculated using the CKD EPI equation. This calculation has not been validated in all clinical situations. eGFR's persistently <90 mL/min signify possible Chronic Kidney Disease.    Anion gap 5 5 - 15  Magnesium     Status: None   Collection Time: 07/16/14  5:25 AM  Result Value Ref Range   Magnesium 2.0 1.5 - 2.5 mg/dL  Protime-INR     Status: Abnormal   Collection Time: 07/16/14  7:00 AM  Result Value Ref Range   Prothrombin Time 15.4 (H) 11.6 - 15.2 seconds   INR 1.21 0.00 - 1.49  CBC     Status: Abnormal   Collection Time: 07/17/14  6:55 AM  Result Value Ref Range   WBC 9.1 4.0 - 10.5 K/uL   RBC 2.46 (L) 4.22 - 5.81  MIL/uL   Hemoglobin 7.3 (L) 13.0 - 17.0 g/dL   HCT 22.2 (L) 39.0 - 52.0 %   MCV 90.2 78.0 - 100.0 fL   MCH 29.7 26.0 - 34.0 pg   MCHC 32.9 30.0 - 36.0 g/dL   RDW 14.9 11.5 - 15.5 %   Platelets 236 150 - 400 K/uL    Comment: REPEATED TO VERIFY DELTA CHECK NOTED   Basic metabolic panel     Status: Abnormal   Collection Time: 07/17/14  6:55 AM  Result Value Ref  Range   Sodium 138 135 - 145 mmol/L   Potassium 3.3 (L) 3.5 - 5.1 mmol/L   Chloride 106 96 - 112 mmol/L   CO2 29 19 - 32 mmol/L   Glucose, Bld 95 70 - 99 mg/dL   BUN 33 (H) 6 - 23 mg/dL   Creatinine, Ser 1.72 (H) 0.50 - 1.35 mg/dL   Calcium 7.5 (L) 8.4 - 10.5 mg/dL   GFR calc non Af Amer 39 (L) >90 mL/min   GFR calc Af Amer 45 (L) >90 mL/min    Comment: (NOTE) The eGFR has been calculated using the CKD EPI equation. This calculation has not been validated in all clinical situations. eGFR's persistently <90 mL/min signify possible Chronic Kidney Disease.    Anion gap 3 (L) 5 - 15  Magnesium     Status: None   Collection Time: 07/17/14  6:55 AM  Result Value Ref Range   Magnesium 2.1 1.5 - 2.5 mg/dL    Imaging / Studies: No results found.  Medications / Allergies: per chart  Antibiotics: Anti-infectives    Start     Dose/Rate Route Frequency Ordered Stop   07/13/14 1800  ceFAZolin (ANCEF) IVPB 1 g/50 mL premix     1 g 100 mL/hr over 30 Minutes Intravenous Every 6 hours 07/13/14 1618 07/14/14 0648   07/13/14 0547  ceFAZolin (ANCEF) IVPB 2 g/50 mL premix     2 g 100 mL/hr over 30 Minutes Intravenous On call to O.R. 07/13/14 0547 07/13/14 [place], PA-S [facility] [phone] 07/17/2014  9:12 AM

## 2014-07-18 LAB — BASIC METABOLIC PANEL
Anion gap: 5 (ref 5–15)
BUN: 30 mg/dL — ABNORMAL HIGH (ref 6–23)
CO2: 28 mmol/L (ref 19–32)
CREATININE: 1.6 mg/dL — AB (ref 0.50–1.35)
Calcium: 7.6 mg/dL — ABNORMAL LOW (ref 8.4–10.5)
Chloride: 104 mmol/L (ref 96–112)
GFR calc Af Amer: 49 mL/min — ABNORMAL LOW (ref >90)
GFR calc non Af Amer: 42 mL/min — ABNORMAL LOW (ref >90)
GLUCOSE: 101 mg/dL — AB (ref 70–99)
Potassium: 3.5 mmol/L (ref 3.5–5.1)
SODIUM: 137 mmol/L (ref 135–145)

## 2014-07-18 LAB — CBC
HCT: 21.2 % — ABNORMAL LOW (ref 39.0–52.0)
HEMOGLOBIN: 7 g/dL — AB (ref 13.0–17.0)
MCH: 29.7 pg (ref 26.0–34.0)
MCHC: 33 g/dL (ref 30.0–36.0)
MCV: 89.8 fL (ref 78.0–100.0)
Platelets: 267 10*3/uL (ref 150–400)
RBC: 2.36 MIL/uL — AB (ref 4.22–5.81)
RDW: 14.6 % (ref 11.5–15.5)
WBC: 8.8 10*3/uL (ref 4.0–10.5)

## 2014-07-18 LAB — MAGNESIUM: Magnesium: 2 mg/dL (ref 1.5–2.5)

## 2014-07-18 LAB — AMYLASE, PERITONEAL FLUID: Amylase, peritoneal fluid: 14 U/L

## 2014-07-18 MED ORDER — LOSARTAN POTASSIUM 50 MG PO TABS
50.0000 mg | ORAL_TABLET | Freq: Every day | ORAL | Status: DC
  Administered 2014-07-18 – 2014-07-20 (×3): 50 mg via ORAL
  Filled 2014-07-18 (×4): qty 1

## 2014-07-18 MED ORDER — HYDROCHLOROTHIAZIDE 12.5 MG PO CAPS
12.5000 mg | ORAL_CAPSULE | Freq: Every day | ORAL | Status: DC
  Administered 2014-07-18 – 2014-07-20 (×3): 12.5 mg via ORAL
  Filled 2014-07-18 (×4): qty 1

## 2014-07-18 MED ORDER — PANTOPRAZOLE SODIUM 40 MG PO TBEC
40.0000 mg | DELAYED_RELEASE_TABLET | Freq: Every day | ORAL | Status: DC
  Administered 2014-07-18 – 2014-07-22 (×5): 40 mg via ORAL
  Filled 2014-07-18 (×6): qty 1

## 2014-07-18 MED ORDER — TRAZODONE HCL 50 MG PO TABS
50.0000 mg | ORAL_TABLET | Freq: Every day | ORAL | Status: DC
  Administered 2014-07-18 – 2014-07-22 (×5): 50 mg via ORAL
  Filled 2014-07-18 (×6): qty 1

## 2014-07-18 MED ORDER — OXYCODONE-ACETAMINOPHEN 5-325 MG PO TABS
1.0000 | ORAL_TABLET | ORAL | Status: DC | PRN
  Administered 2014-07-18 (×2): 2 via ORAL
  Administered 2014-07-19: 1 via ORAL
  Administered 2014-07-19 – 2014-07-21 (×7): 2 via ORAL
  Administered 2014-07-21 (×2): 1 via ORAL
  Administered 2014-07-22: 2 via ORAL
  Administered 2014-07-22 (×4): 1 via ORAL
  Administered 2014-07-23: 2 via ORAL
  Filled 2014-07-18 (×5): qty 2
  Filled 2014-07-18: qty 1
  Filled 2014-07-18: qty 2
  Filled 2014-07-18: qty 1
  Filled 2014-07-18 (×2): qty 2
  Filled 2014-07-18: qty 1
  Filled 2014-07-18: qty 2
  Filled 2014-07-18: qty 1
  Filled 2014-07-18 (×2): qty 2
  Filled 2014-07-18: qty 1
  Filled 2014-07-18: qty 2
  Filled 2014-07-18: qty 1

## 2014-07-18 NOTE — Progress Notes (Signed)
PT Cancellation Note  Patient Details Name: Darryl Griffith MRN: [id number] DOB: [birth year]   Cancelled Treatment:    Reason Eval/Treat Not Completed: Attempted tx session. Pt declined due to not feeling well. will check back another day.    Weston Anna, MPT Pager: [phone]

## 2014-07-18 NOTE — Progress Notes (Signed)
Patient ID: Darryl Griffith, male   DOB: Jul 15, 1945, 69 y.o.   MRN: [id number]  [facility]  [address] [place], [place] [address] Phone: [phone] FAX: [phone]    Lamarcus Spira [id number] Aug 03, 1945  CARE TEAM:  PCP: Gennette Pac, MD  Outpatient Care Team: Patient Care Team: [name], MD as PCP - General (Family Medicine) Erroll Luna, MD as Consulting Physician (General Surgery)  Inpatient Treatment Team: Treatment Team: Attending Provider: Stark Klein, MD; Registered Nurse: Celedonio Savage, RN; Technician: Tomie China, NT; Registered Nurse: Emeterio Reeve, RN; Physical Therapist: Alphonzo Severance, PT  Problem List:   Principal Problem:   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  Plan:   Acute blood loss anemia - no blood products needed since 4/15.  May need additional transfusion d/t tachycardia and low H&H, but will hold off for now d/t patient being asymptomatic, HR trending back down to WNL, and H&H stable.  Keep drain & follow.  Acute renal insufficiency - Resolving.  Left kidney removed with retroperitoneal mass.  Stable with good UOP. PRN IVF boluses if UOP falls. BUN/Cr trending down. Will add back HCTZ as part of home meds.  Should provide some gentle diuresis.    DIC - appears resolved. Follow.  Advance diet to a regular diet.   Switch Protonix to po.  Hypokalemia - 5 doses  of KCl completed yesterday. Better today, will recheck tomorrow.  Remove foley today. Good urine output. Continue to monitor urine output.  Remove central line today.  Ice/Heat.  Switch to Percocet PO for pain control. Dilaudid prn for breakthrough pain.  Avoid NSAIDS since single present.    HTN - Continue to monitor. Resume home HTN medicine at half dose.   f/u pathology. No results yet, expect results this afternoon or tomorrow.  VTE prophylaxis- SCDs only.  No anticoagulation w coagulopathy/DIC from massive EBL.  Maybe 4/19 if better / stable...  Mobilize as tolerated to help recovery.   07/18/2014  Subjective:  Pt looking and feeling better today. Able to get up and walk around the floor with nurse assistance. Did 3 laps around the floor yesterday and stated the he "overdid it". Tolerating liquid diet well. Some flatus, but no BM. Denies N/V. Slight tachycardia but trending down, high BP resolving , no fever. Drains with more diluted serosanguinous fluid.  Objective:  Vital signs:  Filed Vitals:   07/18/14 0202 07/18/14 0400 07/18/14 0600 07/18/14 0723  BP:   131/68   Pulse:   87   Temp:   97.9 F (36.6 C)   TempSrc:   Oral   Resp: _0 Height:      Weight:      SpO2:  96% 96% 96%    Last BM Date: 07/12/14  Intake/Output   Yesterday:  04/18 0701 - 04/19 0700 In: 560 [I.V.:360; IV Piggyback:200] Out: 2125 [Urine:1425; Drains:700]  Bowel function:  Flatus: Yes  BM: No Drain: thinner -  serosang  Physical Exam:  General: Pt awake/alert/oriented  x4 in mild acute distress Eyes: Sclera clear.  No icterus Psych:  No delerium/psychosis/paranoia HENT: Normocephalic. Chest: No chest wall pain w good excursion CV:  Pulses intact.  Regular rhythm Resp: Breathing non-labored. Abdomen: Obese.  Soft but slightly distended, non-tender. No evidence of peritonitis.  No incarcerated hernias. Ext:  SCDs BLE.  3+ anasarca.  No cyanosis Skin: No petechiae /  purpura  Results:   Labs: Results for orders placed or performed during the hospital encounter of 07/13/14 (from the past 48 hour(s))  CBC     Status: Abnormal   Collection Time: 07/17/14  6:55 AM  Result Value Ref Range   WBC 9.1 4.0 - 10.5 K/uL   RBC 2.46 (L) 4.22 - 5.81 MIL/uL   Hemoglobin 7.3 (L) 13.0 - 17.0 g/dL   HCT 22.2 (L) 39.0 - 52.0 %   MCV 90.2 78.0 - 100.0 fL   MCH 29.7 26.0 - 34.0 pg   MCHC 32.9 30.0 - 36.0 g/dL   RDW 14.9 11.5 - 15.5 %   Platelets 236 150 - 400 K/uL    Comment: REPEATED TO VERIFY DELTA CHECK NOTED   Basic metabolic panel     Status: Abnormal   Collection Time: 07/17/14  6:55 AM  Result Value Ref Range   Sodium 138 135 - 145 mmol/L   Potassium 3.3 (L) 3.5 - 5.1 mmol/L   Chloride 106 96 - 112 mmol/L   CO2 29 19 - 32 mmol/L   Glucose, Bld 95 70 - 99 mg/dL   BUN 33 (H) 6 - 23 mg/dL   Creatinine, Ser 1.72 (H) 0.50 - 1.35 mg/dL   Calcium 7.5 (L) 8.4 - 10.5 mg/dL   GFR calc non Af Amer 39 (L) >90 mL/min   GFR calc Af Amer 45 (L) >90 mL/min    Comment: (NOTE) The eGFR has been calculated using the CKD EPI equation. This calculation has not been validated in all clinical situations. eGFR's persistently <90 mL/min signify possible Chronic Kidney Disease.    Anion gap 3 (L) 5 - 15  Magnesium     Status: None   Collection Time: 07/17/14  6:55 AM  Result Value Ref Range   Magnesium 2.1 1.5 - 2.5 mg/dL  Hemoglobin     Status: Abnormal   Collection Time: 07/17/14  7:35 PM  Result Value Ref Range   Hemoglobin 7.3 (L) 13.0 - 17.0 g/dL  CBC     Status: Abnormal   Collection Time: 07/18/14  3:48 AM  Result Value Ref Range   WBC 8.8 4.0 - 10.5 K/uL   RBC 2.36 (L) 4.22 - 5.81 MIL/uL   Hemoglobin 7.0 (L) 13.0 - 17.0 g/dL   HCT 21.2 (L) 39.0 - 52.0 %   MCV 89.8 78.0 - 100.0 fL   MCH 29.7 26.0 - 34.0 pg   MCHC 33.0 30.0 - 36.0 g/dL   RDW 14.6 11.5 - 15.5 %   Platelets 267 150 - 400 K/uL  Basic metabolic panel     Status: Abnormal   Collection  Time: 07/18/14  3:48 AM  Result Value Ref Range   Sodium 137 135 - 145 mmol/L   Potassium 3.5 3.5 - 5.1 mmol/L   Chloride 104 96 - 112 mmol/L   CO2 28 19 - 32 mmol/L   Glucose, Bld 101 (H) 70 - 99 mg/dL   BUN 30 (H) 6 - 23 mg/dL   Creatinine, Ser 1.60 (H) 0.50 - 1.35 mg/dL   Calcium 7.6 (L) 8.4 -  10.5 mg/dL   GFR calc non Af Amer 42 (L) >90 mL/min   GFR calc Af Amer 49 (L) >90 mL/min    Comment: (NOTE) The eGFR has been calculated using the CKD EPI equation. This calculation has not been validated in all clinical situations. eGFR's persistently <90 mL/min signify possible Chronic Kidney Disease.    Anion gap 5 5 - 15  Magnesium     Status: None   Collection Time: 07/18/14  3:48 AM  Result Value Ref Range   Magnesium 2.0 1.5 - 2.5 mg/dL    Imaging / Studies: No results found.  Medications / Allergies: per chart  Antibiotics: Anti-infectives    Start     Dose/Rate Route Frequency Ordered Stop   07/13/14 1800  ceFAZolin (ANCEF) IVPB 1 g/50 mL premix     1 g 100 mL/hr over 30 Minutes Intravenous Every 6 hours 07/13/14 1618 07/14/14 0648   07/13/14 0547  ceFAZolin (ANCEF) IVPB 2 g/50 mL premix     2 g 100 mL/hr over 30 Minutes Intravenous On call to O.R. 07/13/14 0547 07/13/14 1155      Ariel Hilsinger, PA-S [facility] [phone] 07/18/2014  8:25 AM  Seen, agree with above.  Formulated plan together.  Work toward home.

## 2014-07-18 NOTE — Progress Notes (Signed)
Paged MD to inform about output from JP drain. At 2200 110 was emptied and at 0200 140 was emptied. No new orders received. MD suggested to continue to monitor output. Patient is calm and resting. Will continue to monitor.

## 2014-07-19 LAB — MAGNESIUM: Magnesium: 2 mg/dL (ref 1.5–2.5)

## 2014-07-19 LAB — PREPARE RBC (CROSSMATCH)

## 2014-07-19 MED ORDER — SODIUM CHLORIDE 0.9 % IV SOLN: Freq: Once | INTRAVENOUS | Status: DC

## 2014-07-19 NOTE — Progress Notes (Signed)
Patient ID: Darryl Griffith, male   DOB: Nov 13, 1945, 69 y.o.   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  Plan:   Acute blood loss anemia - feeling more weak since mobility improved.  Transfuse today.    Acute renal insufficiency - Resolving.  Left kidney removed with retroperitoneal mass.  Stable with good UOP.  HCTZ as part of home meds.  Should provide some gentle diuresis.    DIC - appears resolved.   Regular diet  Ice/Heat.  Percocet PO for pain control. Dilaudid prn for breakthrough pain.  Avoid NSAIDS since single present.    HTN - Home HTN medicine at half dose.   f/u pathology. No results yet, expect results this afternoon or tomorrow.  VTE prophylaxis- hold since symptomatic anemia.    Mobilize as tolerated to help recovery.    07/19/2014  Subjective:  Doing well overall, but still weak.  Urinating quite a bit.  Pain OK.    Objective:  Vital signs:  Filed Vitals:   07/18/14 1400 07/18/14 2154 07/19/14 0150 07/19/14 0527  BP: 168/75 130/68 146/66 148/70  Pulse: 102 98 88 98  Temp: 98.5 F (36.9 C) 98.2 F (36.8 C) 98.9 F (37.2 C) 98 F (36.7 C)  TempSrc: Oral Oral Oral Oral  Resp:  _0 Height:      Weight:      SpO2: 94% 90% 94% 94%    Last BM Date: 07/18/14  Intake/Output   Yesterday:  04/19 0701 - 04/20 0700 In: 2100 [P.O.:1080; I.V.:1020] Out: 3685 [Urine:3150; Drains:535]  Bowel function:  Flatus: Yes  BM: No Drain: thinner -  serosang  Physical Exam:  General: Pt awake/alert/oriented x4 in mild acute distress Eyes: Sclera clear.  No icterus Psych:  No delerium/psychosis/paranoia HENT: Normocephalic. Chest: No chest wall pain w good excursion CV:  Pulses intact.  Regular rhythm Resp: Breathing non-labored. Abdomen: Obese.  Soft but slightly distended, non-tender. No evidence of peritonitis.  No incarcerated hernias. Ext:  SCDs BLE.  3+ anasarca.  No cyanosis Skin: No petechiae / purpura  Results:   Labs: Results for orders placed or performed during the hospital encounter of 07/13/14 (from the past 48 hour(s))  Hemoglobin     Status: Abnormal   Collection Time: 07/17/14  7:35 PM  Result Value Ref Range   Hemoglobin 7.3 (L) 13.0 - 17.0 g/dL  CBC     Status: Abnormal   Collection Time: 07/18/14  3:48 AM  Result Value Ref Range  WBC 8.8 4.0 - 10.5 K/uL   RBC 2.36 (L) 4.22 - 5.81 MIL/uL   Hemoglobin 7.0 (L) 13.0 - 17.0 g/dL   HCT 21.2 (L) 39.0 - 52.0 %   MCV 89.8 78.0 - 100.0 fL   MCH 29.7 26.0 - 34.0 pg   MCHC 33.0 30.0 - 36.0 g/dL   RDW 14.6 11.5 - 15.5 %   Platelets 267 150 - 400 K/uL  Basic metabolic panel     Status: Abnormal   Collection Time: 07/18/14  3:48 AM  Result Value Ref Range   Sodium 137 135 - 145 mmol/L   Potassium 3.5 3.5 - 5.1 mmol/L    Chloride 104 96 - 112 mmol/L   CO2 28 19 - 32 mmol/L   Glucose, Bld 101 (H) 70 - 99 mg/dL   BUN 30 (H) 6 - 23 mg/dL   Creatinine, Ser 1.60 (H) 0.50 - 1.35 mg/dL   Calcium 7.6 (L) 8.4 - 10.5 mg/dL   GFR calc non Af Amer 42 (L) >90 mL/min   GFR calc Af Amer 49 (L) >90 mL/min    Comment: (NOTE) The eGFR has been calculated using the CKD EPI equation. This calculation has not been validated in all clinical situations. eGFR's persistently <90 mL/min signify possible Chronic Kidney Disease.    Anion gap 5 5 - 15  Magnesium     Status: None   Collection Time: 07/18/14  3:48 AM  Result Value Ref Range   Magnesium 2.0 1.5 - 2.5 mg/dL  Amylase, Peritoneal Fluid     Status: None   Collection Time: 07/18/14 10:27 AM  Result Value Ref Range   Amylase, peritoneal fluid 14 U/L    Comment: (NOTE) Reference range: Values greater than or equal to three times a simultaneously analyzed serum value are considered abnormal in ascites of pancreatic origin. Other peritoneal and intestional disorders may also cause elevation of amylase. Performed at [facility]   Magnesium     Status: None   Collection Time: 07/19/14  4:54 AM  Result Value Ref Range   Magnesium 2.0 1.5 - 2.5 mg/dL    Imaging / Studies: No results found.  Medications / Allergies: per chart  Antibiotics: Anti-infectives    Start     Dose/Rate Route Frequency Ordered Stop   07/13/14 1800  ceFAZolin (ANCEF) IVPB 1 g/50 mL premix     1 g 100 mL/hr over 30 Minutes Intravenous Every 6 hours 07/13/14 1618 07/14/14 0648   07/13/14 0547  ceFAZolin (ANCEF) IVPB 2 g/50 mL premix     2 g 100 mL/hr over 30 Minutes Intravenous On call to O.R. 07/13/14 8325 07/13/14 1155

## 2014-07-20 LAB — MAGNESIUM: MAGNESIUM: 2 mg/dL (ref 1.5–2.5)

## 2014-07-20 LAB — TYPE AND SCREEN
ABO/RH(D): O NEG
ANTIBODY SCREEN: NEGATIVE
Unit division: 0
Unit division: 0

## 2014-07-20 NOTE — Progress Notes (Signed)
Patient ID: Darryl Griffith, male   DOB: [birth year], 69 y.o.   MRN: [id number]  [facility]  [address] [place], [place] [address] Phone: [phone] FAX: [phone]    Darryl Griffith [id number] [birth year]  CARE TEAM:  PCP: Darryl Pac, MD  Outpatient Care Team: Patient Care Team: Darryl Fess, MD as PCP - General (Family Medicine) Darryl Luna, MD as Consulting Physician (General Surgery)  Inpatient Treatment Team: Treatment Team: Attending Provider: Stark Klein, MD; Technician: Darryl Griffith, NT; Licensed Practical Nurse: Darryl Griffith, [place]; Registered Nurse: Darryl Reeve, RN; Technician: Darryl Griffith, NT; Registered Nurse: Darryl Dawn, RN; Technician: Darryl Griffith, NT; Physical Therapist: Neil Griffith, PT  Problem List:   Principal Problem:   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  Plan:   Acute blood loss anemia - Repeat CBC and BMP today after transfusion on 07/19/14.   Acute renal insufficiency - Resolving.  Left kidney removed with retroperitoneal mass.  Stable with good UOP.  HCTZ as part of home meds.  Should provide some gentle diuresis.    DIC - appears resolved.   Regular diet  Ice/Heat.  Percocet PO for pain control. Dilaudid prn for breakthrough pain.  Avoid NSAIDS since single kidney present.    HTN - Home HTN medicine at half dose. Will see was Cr is and  possibly progress to full home dose tomorrow.  f/u pathology. 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 Will need referral to radiation oncology.  VTE prophylaxis- hold since symptomatic anemia.    Mobilize as tolerated to help recovery. Home in next day or so depending on mobility.  Plan to pull drain and remove staples prior to d/c.  07/20/2014  Subjective:  Doing well overall, feeling stronger after blood transfusion yesterday.  Urinating spontaneously, passing gas, last BM 07/18/14.. Tolerating full diet and oral meds well, no N/V. Pain better today, under control with oral meds. Little serosanguinous fluid in drain.   Objective:  Vital signs:  Filed Vitals:   07/19/14 2001 07/19/14 2139 07/20/14 0540 07/20/14 1400  BP: 134/68 149/70 158/67 148/81  Pulse: 92 89 103 85  Temp: 98.5 F (36.9 C) 98.4 F (36.9 C) 98.4 F (36.9 C) 98.8 F (37.1 C)  TempSrc: Oral Oral Oral Oral  Resp: 18 18 20 18   Height:      Weight:      SpO2: 97% 96% 95% 97%    Last BM Date: 07/18/14  Intake/Output   Yesterday:  04/20 0701 - 04/21 0700 In: 5631 [P.O.:240; I.V.:830; Blood:509] Out: 2360 [Urine:2025; Drains:335]  Bowel function:  Flatus: Yes  BM: No Drain: thinner -  serosang  Physical Exam:  General: Pt awake/alert/oriented x4 in mild acute distress Eyes: Sclera clear.  No icterus Psych:  No delerium/psychosis/paranoia HENT: Normocephalic. Chest: No chest wall pain w good excursion CV:  RRR. Resp: Breathing non-labored. Abdomen: Obese.  Soft but slightly distended, appropriately tender at incision site. No evidence of peritonitis.  No incarcerated hernias. Ext:  SCDs BLE.  3+  anasarca.  No cyanosis Skin: No petechiae / purpura  Results:   Labs: Results for orders placed or performed during the hospital encounter of 07/13/14 (from the past 48 hour(s))  Magnesium     Status: None   Collection Time: 07/19/14  4:54 AM  Result Value Ref Range   Magnesium 2.0 1.5  - 2.5 mg/dL  Prepare RBC     Status: None   Collection Time: 07/19/14 10:11 AM  Result Value Ref Range   Order Confirmation ORDER PROCESSED BY BLOOD BANK   Type and screen for Red Blood Exchange     Status: None   Collection Time: 07/19/14 11:10 AM  Result Value Ref Range   ABO/RH(D) O NEG    Antibody Screen NEG    Sample Expiration 07/22/2014    Unit Number [id number]    Blood Component Type RED CELLS,LR    Unit division 00    Status of Unit ISSUED,FINAL    Transfusion Status OK TO TRANSFUSE    Crossmatch Result COMPATIBLE    Unit Number [id number]    Blood Component Type RED CELLS,LR    Unit division 00    Status of Unit ISSUED,FINAL    Transfusion Status OK TO TRANSFUSE    Crossmatch Result COMPATIBLE   Magnesium     Status: None   Collection Time: 07/20/14  5:00 AM  Result Value Ref Range   Magnesium 2.0 1.5 - 2.5 mg/dL    Imaging / Studies: No results found.  Medications / Allergies: per chart  Antibiotics: Anti-infectives    Start     Dose/Rate Route Frequency Ordered Stop   07/13/14 1800  ceFAZolin (ANCEF) IVPB 1 g/50 mL premix     1 g 100 mL/hr over 30 Minutes Intravenous Every 6 hours 07/13/14 1618 07/14/14 0648   07/13/14 0547  ceFAZolin (ANCEF) IVPB 2 g/50 mL premix     2 g 100 mL/hr over 30 Minutes Intravenous On call to O.R. 07/13/14 0547 07/13/14 [place], PA-S [facility] [phone] 07/20/2014 2:40 PM   Seen, agree with above. Hope for d/c in next day or so with [facility] depending on mobility.

## 2014-07-20 NOTE — Progress Notes (Signed)
Continued spiritual and emotional support with Ivey's wife.

## 2014-07-20 NOTE — Progress Notes (Signed)
Physical Therapy Treatment Patient Details Name: Darryl Griffith MRN: [id number] DOB: 05/08/1945 Today's Date: Jul 22, 2014    History of Present Illness 69 yo male s/p resection/L nephrectomy 07/13/14.     PT Comments    Pt progressing; will benefit from HHPT, expresses that he is anxious regarding D/C home, discussed with pt; will need RW for home as well.  Follow Up Recommendations  Home health PT     Equipment Recommendations  Rolling walker with 5" wheels    Recommendations for Other Services       Precautions / Restrictions Precautions Precaution Comments: JP drain Restrictions Weight Bearing Restrictions: No    Mobility  Bed Mobility Overal bed mobility: Modified Independent             General bed mobility comments: HOB elevated, incr time, has adjustable bed at home  Transfers Overall transfer level: Needs assistance Equipment used: Rolling walker (2 wheeled) Transfers: Sit to/from Stand Sit to Stand: Supervision         General transfer comment: for safety, incr time  Ambulation/Gait Ambulation/Gait assistance: Supervision;Min guard Ambulation Distance (Feet): 260 Feet Assistive device: Rolling walker (2 wheeled) Gait Pattern/deviations: Step-through pattern   Gait velocity interpretation: Below normal speed for age/gender General Gait Details: occasional cues for RW safety   Stairs            Wheelchair Mobility    Modified Rankin (Stroke Patients Only)       Balance                                    Cognition Arousal/Alertness: Awake/alert Behavior During Therapy: WFL for tasks assessed/performed Overall Cognitive Status: Within Functional Limits for tasks assessed                      Exercises General Exercises - Lower Extremity Ankle Circles/Pumps: AROM;Both;10 reps Heel Slides: AROM;Both;10 reps Hip ABduction/ADduction: AROM;Both;10 reps    General Comments        Pertinent Vitals/Pain Pain  Assessment: 0-10 Pain Score: 2  Pain Location: abdomen Pain Descriptors / Indicators: Sore Pain Intervention(s): Monitored during session;Repositioned    Home Living                      Prior Function            PT Goals (current goals can now be found in the care plan section) Acute Rehab PT Goals Patient Stated Goal: less pain. regain independence PT Goal Formulation: With patient Time For Goal Achievement: 07/29/14 Potential to Achieve Goals: Good Progress towards PT goals: Progressing toward goals    Frequency  Min 3X/week    PT Plan Current plan remains appropriate    Co-evaluation             End of Session   Activity Tolerance: Patient tolerated treatment well Patient left: in chair;with call bell/phone within reach     Time: 1447-1510 PT Time Calculation (min) (ACUTE ONLY): 23 min  Charges:  $Gait Training: 23-37 mins                    G Codes:      Sakari Raisanen 07-22-2014, 3:14 PM

## 2014-07-21 LAB — BASIC METABOLIC PANEL
Anion gap: 6 (ref 5–15)
BUN: 21 mg/dL (ref 6–23)
CO2: 25 mmol/L (ref 19–32)
CREATININE: 1.7 mg/dL — AB (ref 0.50–1.35)
Calcium: 7.9 mg/dL — ABNORMAL LOW (ref 8.4–10.5)
Chloride: 105 mmol/L (ref 96–112)
GFR, EST AFRICAN AMERICAN: 46 mL/min — AB (ref >90)
GFR, EST NON AFRICAN AMERICAN: 39 mL/min — AB (ref >90)
Glucose, Bld: 102 mg/dL — ABNORMAL HIGH (ref 70–99)
Potassium: 3.3 mmol/L — ABNORMAL LOW (ref 3.5–5.1)
Sodium: 136 mmol/L (ref 135–145)

## 2014-07-21 LAB — CBC
HEMATOCRIT: 27.6 % — AB (ref 39.0–52.0)
Hemoglobin: 9.1 g/dL — ABNORMAL LOW (ref 13.0–17.0)
MCH: 28.9 pg (ref 26.0–34.0)
MCHC: 33 g/dL (ref 30.0–36.0)
MCV: 87.6 fL (ref 78.0–100.0)
PLATELETS: 462 10*3/uL — AB (ref 150–400)
RBC: 3.15 MIL/uL — ABNORMAL LOW (ref 4.22–5.81)
RDW: 14.7 % (ref 11.5–15.5)
WBC: 9.7 10*3/uL (ref 4.0–10.5)

## 2014-07-21 MED ORDER — POTASSIUM CHLORIDE CRYS ER 20 MEQ PO TBCR
40.0000 meq | EXTENDED_RELEASE_TABLET | Freq: Every day | ORAL | Status: DC
  Administered 2014-07-21 – 2014-07-23 (×3): 40 meq via ORAL
  Filled 2014-07-21 (×3): qty 2

## 2014-07-21 MED ORDER — LOSARTAN POTASSIUM-HCTZ 100-25 MG PO TABS: 1.0000 | ORAL_TABLET | Freq: Every morning | ORAL | Status: DC

## 2014-07-21 MED ORDER — HYDROCHLOROTHIAZIDE 25 MG PO TABS
25.0000 mg | ORAL_TABLET | Freq: Every day | ORAL | Status: DC
  Administered 2014-07-21 – 2014-07-23 (×3): 25 mg via ORAL
  Filled 2014-07-21 (×3): qty 1

## 2014-07-21 MED ORDER — SENNOSIDES-DOCUSATE SODIUM 8.6-50 MG PO TABS
1.0000 | ORAL_TABLET | Freq: Two times a day (BID) | ORAL | Status: DC
  Administered 2014-07-21 (×2): 1 via ORAL
  Filled 2014-07-21 (×4): qty 1

## 2014-07-21 MED ORDER — POLYETHYLENE GLYCOL 3350 17 G PO PACK
17.0000 g | PACK | Freq: Every day | ORAL | Status: DC
  Administered 2014-07-21 – 2014-07-23 (×3): 17 g via ORAL
  Filled 2014-07-21 (×3): qty 1

## 2014-07-21 MED ORDER — LOSARTAN POTASSIUM 50 MG PO TABS
100.0000 mg | ORAL_TABLET | Freq: Every day | ORAL | Status: DC
  Administered 2014-07-21 – 2014-07-23 (×3): 100 mg via ORAL
  Filled 2014-07-21 (×3): qty 2

## 2014-07-21 NOTE — Progress Notes (Signed)
Patient stated he had a normal bowel movement this evening. BM was not observed by nursing staff.

## 2014-07-21 NOTE — Progress Notes (Signed)
Patient ID: Darryl Griffith, male   DOB: 03/22/1946, 68 y.o.   MRN: [id number]  [facility]  [address]  [place], [place] [address] Phone: [phone] FAX: [phone]    Darryl Griffith [id number] 03/26/1946  CARE TEAM:  PCP: LITTLE,KEVIN LORNE, MD  Outpatient Care Team: Patient Care Team: Kevin Little, MD as PCP - General (Family Medicine) Thomas Cornett, MD as Consulting Physician (General Surgery)  Inpatient Treatment Team: Treatment Team: Attending Provider:  , MD; Technician: Natalie A Greene, NT; Licensed Practical Nurse: Tinika Clark, Student-LPN; Registered Nurse: Montressa M Blount, RN; Technician: Whitney R Henderson, NT; Technician: Rebecca K Hanyok, NT  Problem List:   Principal Problem:   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  Plan:   Acute blood loss anemia - stable  Acute renal insufficiency - Resolving.  Left kidney removed with retroperitoneal mass.  Stable with good UOP.  HCTZ as part of home meds.  Should provide some gentle diuresis.    DIC - appears resolved.   Regular diet  Ice/Heat.  Percocet PO for pain control. Dilaudid prn for breakthrough pain.  Avoid NSAIDS since single kidney present.    HTN - Home HTN medicine at half dose. Will see was Cr is and possibly progress to full home dose tomorrow.  f/u pathology. 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 Will need  referral to radiation oncology.  VTE prophylaxis- hold since symptomatic anemia.    Mobilize as tolerated to help recovery. Home in next day or so depending on mobility.  Plan to pull drain and remove staples prior to d/c.  07/21/2014  Subjective:  Had a bad morning.  Got very behind on pain control.    Objective:  Vital signs:  Filed Vitals:   07/20/14 0540 07/20/14 1400 07/20/14 2230 07/21/14 0600  BP: 158/67 148/81 118/53 156/78  Pulse: 103 85 83 85  Temp: 98.4 F (36.9 C) 98.8 F (37.1 C) 98.4 F (36.9 C) 99 F (37.2 C)  TempSrc: Oral Oral Oral Oral  Resp: 20 18 18 18  Height:      Weight:      SpO2: 95% 97% 95% 95%    Last BM Date: 07/19/14  Intake/Output   Yesterday:  04/21 0701 - 04/22 0700 In: 1560 [P.O.:840; I.V.:720] Out: 3955 [Urine:3625; Drains:330]  Bowel function:  Flatus: Yes  BM: No Drain: thinner -  serosang  Physical Exam:  General: Pt awake/alert/oriented x4 in mild acute distress Eyes: Sclera clear.  No icterus Psych:  No delerium/psychosis/paranoia HENT: Normocephalic. Chest: No chest wall pain w good excursion CV:  RRR. Resp: Breathing non-labored. Abdomen: Obese.  Soft but slightly distended, appropriately tender at incision site. No evidence of peritonitis.  No incarcerated hernias. Ext:  SCDs BLE.  3+ anasarca.  No cyanosis Skin: No petechiae / purpura  Results:   Labs: Results for orders placed or performed during the hospital encounter of 07/13/14 (from the past 48 hour(s))  Prepare RBC     Status: None   Collection Time: 07/19/14 10:11 AM  Result   Value Ref Range   Order Confirmation ORDER PROCESSED BY BLOOD BANK   Type and screen for Red Blood Exchange     Status: None   Collection Time: 07/19/14 11:10 AM  Result Value Ref Range   ABO/RH(D) O NEG    Antibody Screen NEG    Sample Expiration 07/22/2014    Unit Number [id number]    Blood Component Type RED CELLS,LR    Unit division 00    Status of Unit ISSUED,FINAL     Transfusion Status OK TO TRANSFUSE    Crossmatch Result COMPATIBLE    Unit Number [id number]    Blood Component Type RED CELLS,LR    Unit division 00    Status of Unit ISSUED,FINAL    Transfusion Status OK TO TRANSFUSE    Crossmatch Result COMPATIBLE   Magnesium     Status: None   Collection Time: 07/20/14  5:00 AM  Result Value Ref Range   Magnesium 2.0 1.5 - 2.5 mg/dL  CBC     Status: Abnormal   Collection Time: 07/21/14  5:02 AM  Result Value Ref Range   WBC 9.7 4.0 - 10.5 K/uL   RBC 3.15 (L) 4.22 - 5.81 MIL/uL   Hemoglobin 9.1 (L) 13.0 - 17.0 g/dL   HCT 27.6 (L) 39.0 - 52.0 %   MCV 87.6 78.0 - 100.0 fL   MCH 28.9 26.0 - 34.0 pg   MCHC 33.0 30.0 - 36.0 g/dL   RDW 14.7 11.5 - 15.5 %   Platelets 462 (H) 150 - 400 K/uL  Basic metabolic panel     Status: Abnormal   Collection Time: 07/21/14  5:02 AM  Result Value Ref Range   Sodium 136 135 - 145 mmol/L   Potassium 3.3 (L) 3.5 - 5.1 mmol/L   Chloride 105 96 - 112 mmol/L   CO2 25 19 - 32 mmol/L   Glucose, Bld 102 (H) 70 - 99 mg/dL   BUN 21 6 - 23 mg/dL   Creatinine, Ser 1.70 (H) 0.50 - 1.35 mg/dL   Calcium 7.9 (L) 8.4 - 10.5 mg/dL   GFR calc non Af Amer 39 (L) >90 mL/min   GFR calc Af Amer 46 (L) >90 mL/min    Comment: (NOTE) The eGFR has been calculated using the CKD EPI equation. This calculation has not been validated in all clinical situations. eGFR's persistently <90 mL/min signify possible Chronic Kidney Disease.    Anion gap 6 5 - 15    Imaging / Studies: No results found.  Medications / Allergies: per chart  Antibiotics: Anti-infectives    Start     Dose/Rate Route Frequency Ordered Stop   07/13/14 1800  ceFAZolin (ANCEF) IVPB 1 g/50 mL premix     1 g 100 mL/hr over 30 Minutes Intravenous Every 6 hours 07/13/14 1618 07/14/14 0648   07/13/14 0547  ceFAZolin (ANCEF) IVPB 2 g/50 mL premix     2 g 100 mL/hr over 30 Minutes Intravenous On call to O.R. 07/13/14 0547 07/13/14 1155       

## 2014-07-21 NOTE — Discharge Instructions (Signed)
[facility], [place] [phone]  ABDOMINAL SURGERY: POST OP INSTRUCTIONS  Always review your discharge instruction sheet given to you by the facility where your surgery was performed.  IF YOU HAVE DISABILITY OR FAMILY LEAVE FORMS, YOU MUST BRING THEM TO THE OFFICE FOR PROCESSING.  PLEASE DO NOT GIVE THEM TO YOUR DOCTOR.  1. A prescription for pain medication may be given to you upon discharge.  Take your pain medication as prescribed, if needed.  If narcotic pain medicine is not needed, then you may take acetaminophen (Tylenol) or ibuprofen (Advil) as needed. 2. Take your usually prescribed medications unless otherwise directed. 3. If you need a refill on your pain medication, please contact your pharmacy. They will contact our office to request authorization.  Prescriptions will not be filled after 5pm or on week-ends. 4. You should follow a light diet the first few days after arrival home, such as soup and crackers, pudding, etc.unless your doctor has advised otherwise. A high-fiber, low fat diet can be resumed as tolerated.   Be sure to include lots of fluids daily. Most patients will experience some swelling and bruising on the chest and neck area.  Ice packs will help.  Swelling and bruising can take several days to resolve 5. Most patients will experience some swelling and bruising in the area of the incision. Ice pack will help. Swelling and bruising can take several days to resolve..  6. It is common to experience some constipation if taking pain medication after surgery.  Increasing fluid intake and taking a stool softener will usually help or prevent this problem from occurring.  A mild laxative (Milk of Magnesia or Miralax) should be taken according to package directions if there are no bowel movements after 48 hours. 7.  You may have steri-strips (small skin tapes) in place directly over the incision.  These strips should be left on the skin for 10-14 days.  If your  surgeon used skin glue on the incision, you may shower in 48 hours.  The glue will flake off over the next 2-3 weeks.  Any sutures or staples will be removed at the office during your follow-up visit. You may find that a light gauze bandage over your incision may keep your staples from being rubbed or pulled. You may shower and replace the bandage daily. 8. ACTIVITIES:  You may resume regular (light) daily activities beginning the next day--such as daily self-care, walking, climbing stairs--gradually increasing activities as tolerated.  You may have sexual intercourse when it is comfortable.  Refrain from any heavy lifting or straining until approved by your doctor. a. You may drive when you no longer are taking prescription pain medication, you can comfortably wear a seatbelt, and you can safely maneuver your car and apply brakes b. Return to Work: __________5-17 weeks if applicable_________________________ 9. You should see your doctor in the office for a follow-up appointment approximately two weeks after your surgery.  Make sure that you call for this appointment within a day or two after you arrive home to insure a convenient appointment time. OTHER INSTRUCTIONS:  _____________________________________________________________ _____________________________________________________________  WHEN TO CALL YOUR DOCTOR: 1. Fever over 101.0 2. Inability to urinate 3. Nausea and/or vomiting 4. Extreme swelling or bruising 5. Continued bleeding from incision. 6. Increased pain, redness, or drainage from the incision. 7. Difficulty swallowing or breathing 8. Muscle cramping or spasms. 9. Numbness or tingling in hands or feet or around lips.  The clinic staff is  available to answer your questions during regular business hours.  Please don’t hesitate to call and ask to speak to one of the nurses if you have concerns. ° °For further questions, please visit [email or web address] ° ° ° °

## 2014-07-21 NOTE — Care Management Note (Signed)
CARE MANAGEMENT NOTE 07/21/2014  Patient:  Darryl Griffith, Darryl Griffith   Account Number:  [id number]  Date Initiated:  07/14/2014  Documentation initiated by:  DAVIS,RHONDA  Subjective/Objective Assessment:   69 yo male L retroperitoneal tumor involving renal arteries. Requiring L nephrectomy and mobilization of pancreas to resect. Complicated by massive blood loss.     Action/Plan:   home when stable   Anticipated DC Date:  07/22/2014   Anticipated DC Plan:  [place] referral  NA      DC Planning Services  CM consult      [facility] Choice  HOME HEALTH   Choice offered to / List presented to:  C-1 Patient           Status of service:  Completed, signed off Medicare Important Message given?  YES (If response is "NO", the following Medicare IM given date fields will be blank) Date Medicare IM given:  07/17/2014 Medicare IM given by:  [facility] Date Additional Medicare IM given:  07/21/2014 Additional Medicare IM given by:  Marney Doctor  Discharge Disposition:  [place]  Per UR Regulation:  Reviewed for med. necessity/level of care/duration of stay  If discussed at [place] of Stay Meetings, dates discussed:    Comments:  07/21/14 Marney Doctor RN,BSN,NCM [phone] PT is recommending HHPT. Pt would also like a HHRN to check on him once or twice when he gets home as well. Pt is requesting a RW,Cane,and tub bench.  Will need MD orders for [facility] and DME. Pt offered choice and chose [facility]. Referral for [facility] services called to [facility] rep. CM will continue to follow.  July 14, 2014/Rhonda L. Rosana Hoes, RN, BSN, CCM. Case Management [place] [phone] No discharge needs present of time of review. Vent day-1

## 2014-07-22 NOTE — Progress Notes (Signed)
General Surgery Note  LOS: 9 days  POD -  9 Days Post-Op  Assessment/Plan: 1.  RESECTION OF left RETROPERITONEAL MASS - 07/13/2014 - F. 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  Needs walker and home PT.  Will remove staples and drain tomorrow (per [facility] note)  I have put orders for home PT, walker, and shower chair are in computer  2.  HTN 3.  Anemia - Hgb - 9.1 - 07/21/2014 4.  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  Wife in room.  They have a lot of questions about follow up of sarcoma. Objective:   Filed Vitals:   07/22/14 0504  BP: 163/78  Pulse: 86  Temp: 97.8 F (36.6 C)  Resp: 18     Intake/Output from previous day:  04/22 0701 - 04/23 0700 In: 870 [P.O.:360; I.V.:510] Out: 2130 [Urine:4225; Drains:150]  Intake/Output this shift:      Physical Exam:   General: WN older WM who is alert and oriented.    HEENT: Normal. Pupils equal. .   Lungs: Clear.  IS - 1,400 cc   Abdomen: Soft.  BS present   Wound: Clean.    Drain - 150 cc yesterday.   Lab Results:    Recent Labs  07/21/14 0502  WBC 9.7  HGB 9.1*  HCT 27.6*  PLT 462*    BMET   Recent Labs  07/21/14 0502  NA 136  K 3.3*  CL 105  CO2 25  GLUCOSE 102*  BUN 21  CREATININE 1.70*  CALCIUM 7.9*    PT/INR  No results for input(s): LABPROT, INR in the last 72 hours.  ABG  No results for input(s): PHART, HCO3 in the last 72 hours.  Invalid input(s): PCO2, PO2   Studies/Results:  No results found.   Anti-infectives:   Anti-infectives    Start     Dose/Rate Route Frequency Ordered Stop   07/13/14 1800  ceFAZolin (ANCEF)  IVPB 1 g/50 mL premix     1 g 100 mL/hr over 30 Minutes Intravenous Every 6 hours 07/13/14 1618 07/14/14 0648   07/13/14 0547  ceFAZolin (ANCEF) IVPB 2 g/50 mL premix     2 g 100 mL/hr over 30 Minutes Intravenous On call to O.R. 07/13/14 0547 07/13/14 1155      Darryl Overall, MD, FACS Pager: [facility] Office: [phone] 07/22/2014

## 2014-07-23 NOTE — Discharge Summary (Signed)
Physician Discharge Summary  Patient ID:  Darryl Griffith  MRN: [id number]  DOB/AGE: 70-Oct-1947 70 y.o.  Admit date: 07/13/2014 Discharge date: 07/23/2014  Discharge Diagnoses:  1.  RESECTION OF left RETROPERITONEAL MASS - 07/13/2014 - 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 HTN 3. Anemia - Hgb - 9.1 - 07/21/2014   Principal Problem:   Left Retroperitoneal mass s/p resection/L nephrectomy 07/13/2014 Active Problems:   Hypertension   Postoperative anemia due to acute blood loss   ARF (acute renal failure)   Thrombocytopenia   Operation: Procedure(s):  RESECTION OF left RETROPERITONEAL MASS on 07/13/2014 Barry Dienes  Discharged Condition: good  Hospital Course: Darryl Griffith is an 69 y.o. male whose primary care physician is Gennette Pac, MD and who was admitted 07/13/2014 with a chief complaint of left retroperitoneal mass.   He was brought to the operating room on 07/13/2014 and underwent  RESECTION OF left RETROPERITONEAL MASS.   He is now 10 days out from surgery. His drain and staples are removed.  He is ready to go home. I have arranged home PT, a walker, and a shower chair for him. He will call the office Monday for follow up plans with Dr. Barry Dienes.  The discharge instructions were reviewed with the patient.  Consults: None  Significant Diagnostic Studies: Results for orders placed or performed during the hospital encounter of 07/13/14  MRSA PCR Screening  Result Value Ref Range   MRSA by PCR NEGATIVE NEGATIVE  CBC  Result Value Ref Range   WBC 4.7 4.0 - 10.5 K/uL   RBC 2.28 (L) 4.22 - 5.81 MIL/uL   Hemoglobin 6.7 (LL) 13.0 - 17.0 g/dL   HCT 20.0 (L) 39.0 - 52.0 %   MCV 87.7 78.0 - 100.0 fL   MCH 29.4 26.0 - 34.0 pg   MCHC 33.5 30.0 - 36.0 g/dL   RDW 17.9 (H) 11.5 - 15.5 %   Platelets 57 (L) 150 - 400 K/uL  DIC (disseminated intravasc coag) panel  Result Value Ref Range   Prothrombin Time 27.6 (H) 11.6 - 15.2 seconds   INR  2.54 (H) 0.00 - 1.49   aPTT 74 (H) 24 - 37 seconds   Fibrinogen 78 (LL) 204 - 475 mg/dL   D-Dimer, Quant 2.29 (H) 0.00 - 0.48 ug/mL-FEU   Platelets 54 (L) 150 - 400 K/uL   Smear Review NO SCHISTOCYTES SEEN   Blood gas, arterial  Result Value Ref Range   FIO2 1.00 %   Delivery systems VENTILATOR    Mode PRESSURE REGULATED VOLUME CONTROL    VT 550 mL   Rate 15 resp/min   Peep/cpap 5.0 cm H20   pH, Arterial 7.212 (L) 7.350 - 7.450   pCO2 arterial 52.7 (H) 35.0 - 45.0 mmHg   pO2, Arterial 476.0 (H) 80.0 - 100.0 mmHg   Bicarbonate 20.4 20.0 - 24.0 mEq/L   TCO2 19.5 0 - 100 mmol/L   Acid-base deficit 7.1 (H) 0.0 - 2.0 mmol/L   O2 Saturation 99.8 %   Patient temperature 98.6    Collection site A-LINE    Drawn by [phone]    Sample type ARTERIAL DRAW    Allens test (pass/fail) PASS PASS  Calcium, ionized  Result Value Ref Range   Calcium, Ion 0.97 (L) 1.12 - 1.32 mmol/L  Protime-INR  Result Value Ref Range   Prothrombin Time 17.5 (H) 11.6 - 15.2 seconds   INR 1.42 0.00 - 1.49  APTT  Result Value Ref Range  aPTT 40 (H) 24 - 37 seconds  Fibrinogen  Result Value Ref Range   Fibrinogen 241 204 - 475 mg/dL  Basic metabolic panel  Result Value Ref Range   Sodium 142 135 - 145 mmol/L   Potassium 3.9 3.5 - 5.1 mmol/L   Chloride 108 96 - 112 mmol/L   CO2 30 19 - 32 mmol/L   Glucose, Bld 119 (H) 70 - 99 mg/dL   BUN 26 (H) 6 - 23 mg/dL   Creatinine, Ser 1.59 (H) 0.50 - 1.35 mg/dL   Calcium 6.6 (L) 8.4 - 10.5 mg/dL   GFR calc non Af Amer 43 (L) >90 mL/min   GFR calc Af Amer 49 (L) >90 mL/min   Anion gap 4 (L) 5 - 15  Magnesium  Result Value Ref Range   Magnesium 1.3 (L) 1.5 - 2.5 mg/dL  Phosphorus  Result Value Ref Range   Phosphorus 3.6 2.3 - 4.6 mg/dL  Blood gas, arterial  Result Value Ref Range   FIO2 0.50 %   Delivery systems VENTILATOR    Mode PRESSURE REGULATED VOLUME CONTROL    VT 550 mL   Rate 20 resp/min   Peep/cpap 5.0 cm H20   pH, Arterial 7.412 7.350 - 7.450    pCO2 arterial 38.2 35.0 - 45.0 mmHg   pO2, Arterial 209.0 (H) 80.0 - 100.0 mmHg   Bicarbonate 24.6 (H) 20.0 - 24.0 mEq/L   TCO2 23.8 0 - 100 mmol/L   Acid-Base Excess 0.0 0.0 - 2.0 mmol/L   O2 Saturation 99.9 %   Patient temperature 94.3    Collection site ARTERIAL LINE    Drawn by [phone]    Sample type ARTERIAL    Allens test (pass/fail) PASS PASS  Calcium, ionized  Result Value Ref Range   Calcium, Ion 0.98 (L) 1.12 - 1.32 mmol/L  APTT  Result Value Ref Range   aPTT 34 24 - 37 seconds  Fibrinogen  Result Value Ref Range   Fibrinogen 269 204 - 475 mg/dL  CBC  Result Value Ref Range   WBC 6.7 4.0 - 10.5 K/uL   RBC 2.41 (L) 4.22 - 5.81 MIL/uL   Hemoglobin 7.2 (L) 13.0 - 17.0 g/dL   HCT 20.9 (L) 39.0 - 52.0 %   MCV 86.7 78.0 - 100.0 fL   MCH 29.9 26.0 - 34.0 pg   MCHC 34.4 30.0 - 36.0 g/dL   RDW 16.2 (H) 11.5 - 15.5 %   Platelets 46 (L) 150 - 400 K/uL  Protime-INR  Result Value Ref Range   Prothrombin Time 17.5 (H) 11.6 - 15.2 seconds   INR 1.42 0.00 - 1.49  CBC  Result Value Ref Range   WBC 5.1 4.0 - 10.5 K/uL   RBC 2.04 (L) 4.22 - 5.81 MIL/uL   Hemoglobin 6.0 (LL) 13.0 - 17.0 g/dL   HCT 17.6 (L) 39.0 - 52.0 %   MCV 86.3 78.0 - 100.0 fL   MCH 29.4 26.0 - 34.0 pg   MCHC 34.1 30.0 - 36.0 g/dL   RDW 16.2 (H) 11.5 - 15.5 %   Platelets 90 (L) 150 - 400 K/uL  CBC  Result Value Ref Range   WBC 5.2 4.0 - 10.5 K/uL   RBC 2.54 (L) 4.22 - 5.81 MIL/uL   Hemoglobin 7.6 (L) 13.0 - 17.0 g/dL   HCT 21.9 (L) 39.0 - 52.0 %   MCV 86.2 78.0 - 100.0 fL   MCH 29.9 26.0 - 34.0 pg   MCHC 34.7  30.0 - 36.0 g/dL   RDW 15.5 11.5 - 15.5 %   Platelets 115 (L) 150 - 400 K/uL  Basic metabolic panel  Result Value Ref Range   Sodium 141 135 - 145 mmol/L   Potassium 3.7 3.5 - 5.1 mmol/L   Chloride 108 96 - 112 mmol/L   CO2 27 19 - 32 mmol/L   Glucose, Bld 194 (H) 70 - 99 mg/dL   BUN 25 (H) 6 - 23 mg/dL   Creatinine, Ser 1.52 (H) 0.50 - 1.35 mg/dL   Calcium 6.7 (L) 8.4 - 10.5 mg/dL    GFR calc non Af Amer 45 (L) >90 mL/min   GFR calc Af Amer 52 (L) >90 mL/min   Anion gap 6 5 - 15  Hemoglobin and hematocrit, blood  Result Value Ref Range   Hemoglobin 7.7 (L) 13.0 - 17.0 g/dL   HCT 21.8 (L) 39.0 - 52.0 %  Calcium, ionized  Result Value Ref Range   Calcium, Ion 0.99 (L) 1.12 - 1.32 mmol/L  Calcium, ionized  Result Value Ref Range   Calcium, Ion 1.05 (L) 1.12 - 1.32 mmol/L  APTT  Result Value Ref Range   aPTT 33 24 - 37 seconds  APTT  Result Value Ref Range   aPTT 36 24 - 37 seconds  Fibrinogen  Result Value Ref Range   Fibrinogen 312 204 - 475 mg/dL  Fibrinogen  Result Value Ref Range   Fibrinogen 413 204 - 475 mg/dL  Protime-INR  Result Value Ref Range   Prothrombin Time 15.3 (H) 11.6 - 15.2 seconds   INR 1.20 0.00 - 1.49  CBC  Result Value Ref Range   WBC 7.0 4.0 - 10.5 K/uL   RBC 2.64 (L) 4.22 - 5.81 MIL/uL   Hemoglobin 7.9 (L) 13.0 - 17.0 g/dL   HCT 22.8 (L) 39.0 - 52.0 %   MCV 86.4 78.0 - 100.0 fL   MCH 29.9 26.0 - 34.0 pg   MCHC 34.6 30.0 - 36.0 g/dL   RDW 15.9 (H) 11.5 - 15.5 %   Platelets 110 (L) 150 - 400 K/uL  Calcium, ionized  Result Value Ref Range   Calcium, Ion 1.06 (L) 1.12 - 1.32 mmol/L  APTT  Result Value Ref Range   aPTT 31 24 - 37 seconds  Fibrinogen  Result Value Ref Range   Fibrinogen 379 204 - 475 mg/dL  CBC  Result Value Ref Range   WBC 7.0 4.0 - 10.5 K/uL   RBC 2.46 (L) 4.22 - 5.81 MIL/uL   Hemoglobin 7.4 (L) 13.0 - 17.0 g/dL   HCT 21.4 (L) 39.0 - 52.0 %   MCV 87.0 78.0 - 100.0 fL   MCH 30.1 26.0 - 34.0 pg   MCHC 34.6 30.0 - 36.0 g/dL   RDW 15.9 (H) 11.5 - 15.5 %   Platelets 114 (L) 150 - 400 K/uL  CBC  Result Value Ref Range   WBC 7.1 4.0 - 10.5 K/uL   RBC 2.37 (L) 4.22 - 5.81 MIL/uL   Hemoglobin 7.2 (L) 13.0 - 17.0 g/dL   HCT 20.8 (L) 39.0 - 52.0 %   MCV 87.8 78.0 - 100.0 fL   MCH 30.4 26.0 - 34.0 pg   MCHC 34.6 30.0 - 36.0 g/dL   RDW 16.0 (H) 11.5 - 15.5 %   Platelets 107 (L) 150 - 400 K/uL  Basic  metabolic panel  Result Value Ref Range   Sodium 141 135 - 145 mmol/L   Potassium 3.6  3.5 - 5.1 mmol/L   Chloride 110 96 - 112 mmol/L   CO2 28 19 - 32 mmol/L   Glucose, Bld 111 (H) 70 - 99 mg/dL   BUN 30 (H) 6 - 23 mg/dL   Creatinine, Ser 1.64 (H) 0.50 - 1.35 mg/dL   Calcium 6.9 (L) 8.4 - 10.5 mg/dL   GFR calc non Af Amer 41 (L) >90 mL/min   GFR calc Af Amer 48 (L) >90 mL/min   Anion gap 3 (L) 5 - 15  Calcium, ionized  Result Value Ref Range   Calcium, Ion 1.06 (L) 1.12 - 1.32 mmol/L  APTT  Result Value Ref Range   aPTT 33 24 - 37 seconds  Fibrinogen  Result Value Ref Range   Fibrinogen 411 204 - 475 mg/dL  CBC  Result Value Ref Range   WBC 7.0 4.0 - 10.5 K/uL   RBC 2.49 (L) 4.22 - 5.81 MIL/uL   Hemoglobin 7.6 (L) 13.0 - 17.0 g/dL   HCT 21.6 (L) 39.0 - 52.0 %   MCV 86.7 78.0 - 100.0 fL   MCH 30.5 26.0 - 34.0 pg   MCHC 35.2 30.0 - 36.0 g/dL   RDW 15.9 (H) 11.5 - 15.5 %   Platelets 113 (L) 150 - 400 K/uL  Hemoglobin  Result Value Ref Range   Hemoglobin 7.3 (L) 13.0 - 17.0 g/dL  Fibrinogen  Result Value Ref Range   Fibrinogen 573 (H) 204 - 475 mg/dL  CBC  Result Value Ref Range   WBC 8.7 4.0 - 10.5 K/uL   RBC 2.37 (L) 4.22 - 5.81 MIL/uL   Hemoglobin 7.2 (L) 13.0 - 17.0 g/dL   HCT 21.2 (L) 39.0 - 52.0 %   MCV 89.5 78.0 - 100.0 fL   MCH 30.4 26.0 - 34.0 pg   MCHC 34.0 30.0 - 36.0 g/dL   RDW 15.2 11.5 - 15.5 %   Platelets 150 150 - 400 K/uL  Basic metabolic panel  Result Value Ref Range   Sodium 143 135 - 145 mmol/L   Potassium 3.6 3.5 - 5.1 mmol/L   Chloride 110 96 - 112 mmol/L   CO2 28 19 - 32 mmol/L   Glucose, Bld 91 70 - 99 mg/dL   BUN 32 (H) 6 - 23 mg/dL   Creatinine, Ser 1.65 (H) 0.50 - 1.35 mg/dL   Calcium 7.6 (L) 8.4 - 10.5 mg/dL   GFR calc non Af Amer 41 (L) >90 mL/min   GFR calc Af Amer 47 (L) >90 mL/min   Anion gap 5 5 - 15  Magnesium  Result Value Ref Range   Magnesium 2.0 1.5 - 2.5 mg/dL  Protime-INR  Result Value Ref Range   Prothrombin Time  15.4 (H) 11.6 - 15.2 seconds   INR 1.21 0.00 - 1.49  CBC  Result Value Ref Range   WBC 9.1 4.0 - 10.5 K/uL   RBC 2.46 (L) 4.22 - 5.81 MIL/uL   Hemoglobin 7.3 (L) 13.0 - 17.0 g/dL   HCT 22.2 (L) 39.0 - 52.0 %   MCV 90.2 78.0 - 100.0 fL   MCH 29.7 26.0 - 34.0 pg   MCHC 32.9 30.0 - 36.0 g/dL   RDW 14.9 11.5 - 15.5 %   Platelets 236 150 - 400 K/uL  Basic metabolic panel  Result Value Ref Range   Sodium 138 135 - 145 mmol/L   Potassium 3.3 (L) 3.5 - 5.1 mmol/L   Chloride 106 96 - 112 mmol/L   CO2  29 19 - 32 mmol/L   Glucose, Bld 95 70 - 99 mg/dL   BUN 33 (H) 6 - 23 mg/dL   Creatinine, Ser 1.72 (H) 0.50 - 1.35 mg/dL   Calcium 7.5 (L) 8.4 - 10.5 mg/dL   GFR calc non Af Amer 39 (L) >90 mL/min   GFR calc Af Amer 45 (L) >90 mL/min   Anion gap 3 (L) 5 - 15  Magnesium  Result Value Ref Range   Magnesium 2.1 1.5 - 2.5 mg/dL  Hemoglobin  Result Value Ref Range   Hemoglobin 7.3 (L) 13.0 - 17.0 g/dL  CBC  Result Value Ref Range   WBC 8.8 4.0 - 10.5 K/uL   RBC 2.36 (L) 4.22 - 5.81 MIL/uL   Hemoglobin 7.0 (L) 13.0 - 17.0 g/dL   HCT 21.2 (L) 39.0 - 52.0 %   MCV 89.8 78.0 - 100.0 fL   MCH 29.7 26.0 - 34.0 pg   MCHC 33.0 30.0 - 36.0 g/dL   RDW 14.6 11.5 - 15.5 %   Platelets 267 150 - 400 K/uL  Basic metabolic panel  Result Value Ref Range   Sodium 137 135 - 145 mmol/L   Potassium 3.5 3.5 - 5.1 mmol/L   Chloride 104 96 - 112 mmol/L   CO2 28 19 - 32 mmol/L   Glucose, Bld 101 (H) 70 - 99 mg/dL   BUN 30 (H) 6 - 23 mg/dL   Creatinine, Ser 1.60 (H) 0.50 - 1.35 mg/dL   Calcium 7.6 (L) 8.4 - 10.5 mg/dL   GFR calc non Af Amer 42 (L) >90 mL/min   GFR calc Af Amer 49 (L) >90 mL/min   Anion gap 5 5 - 15  Magnesium  Result Value Ref Range   Magnesium 2.0 1.5 - 2.5 mg/dL  Amylase, Peritoneal Fluid  Result Value Ref Range   Amylase, peritoneal fluid 14 U/L  Magnesium  Result Value Ref Range   Magnesium 2.0 1.5 - 2.5 mg/dL  Magnesium  Result Value Ref Range   Magnesium 2.0 1.5 - 2.5  mg/dL  CBC  Result Value Ref Range   WBC 9.7 4.0 - 10.5 K/uL   RBC 3.15 (L) 4.22 - 5.81 MIL/uL   Hemoglobin 9.1 (L) 13.0 - 17.0 g/dL   HCT 27.6 (L) 39.0 - 52.0 %   MCV 87.6 78.0 - 100.0 fL   MCH 28.9 26.0 - 34.0 pg   MCHC 33.0 30.0 - 36.0 g/dL   RDW 14.7 11.5 - 15.5 %   Platelets 462 (H) 150 - 400 K/uL  Basic metabolic panel  Result Value Ref Range   Sodium 136 135 - 145 mmol/L   Potassium 3.3 (L) 3.5 - 5.1 mmol/L   Chloride 105 96 - 112 mmol/L   CO2 25 19 - 32 mmol/L   Glucose, Bld 102 (H) 70 - 99 mg/dL   BUN 21 6 - 23 mg/dL   Creatinine, Ser 1.70 (H) 0.50 - 1.35 mg/dL   Calcium 7.9 (L) 8.4 - 10.5 mg/dL   GFR calc non Af Amer 39 (L) >90 mL/min   GFR calc Af Amer 46 (L) >90 mL/min   Anion gap 6 5 - 15  POCT I-Stat EG7  Result Value Ref Range   pH, Ven 7.271 7.250 - 7.300   pCO2, Ven 49.2 45.0 - 50.0 mmHg   pO2, Ven 40.0 30.0 - 45.0 mmHg   Bicarbonate 22.7 20.0 - 24.0 mEq/L   TCO2 24 0 - 100 mmol/L   O2  Saturation 68.0 %   Acid-base deficit 4.0 (H) 0.0 - 2.0 mmol/L   Sodium 136 135 - 145 mmol/L   Potassium 4.0 3.5 - 5.1 mmol/L   Calcium, Ion 1.16 1.13 - 1.30 mmol/L   HCT 26.0 (L) 39.0 - 52.0 %   Hemoglobin 8.8 (L) 13.0 - 17.0 g/dL   Patient temperature 36.7 C    Sample type VENOUS   Prepare RBC (crossmatch)  Result Value Ref Range   Order Confirmation ORDER PROCESSED BY BLOOD BANK   Type and screen  Result Value Ref Range   ABO/RH(D) O NEG    Antibody Screen NEG    Sample Expiration 07/16/2014    Unit Number [id number]    Blood Component Type RED CELLS,LR    Unit division 00    Status of Unit ISSUED,FINAL    Transfusion Status OK TO TRANSFUSE    Crossmatch Result Compatible    Unit Number [id number]    Blood Component Type RBC LR PHER1    Unit division 00    Status of Unit ISSUED,FINAL    Transfusion Status OK TO TRANSFUSE    Crossmatch Result Compatible    Unit Number [id number]    Blood Component Type RED CELLS,LR    Unit division 00    Status  of Unit ISSUED,FINAL    Transfusion Status OK TO TRANSFUSE    Crossmatch Result Compatible    Unit Number [id number]    Blood Component Type RBC LR PHER2    Unit division 00    Status of Unit ISSUED,FINAL    Transfusion Status OK TO TRANSFUSE    Crossmatch Result Compatible    Unit Number [id number]    Blood Component Type RBC LR PHER1    Unit division 00    Status of Unit ISSUED,FINAL    Transfusion Status OK TO TRANSFUSE    Crossmatch Result Compatible    Unit Number [id number]    Blood Component Type RED CELLS,LR    Unit division 00    Status of Unit ISSUED,FINAL    Transfusion Status OK TO TRANSFUSE    Crossmatch Result Compatible    Unit Number [id number]    Blood Component Type RED CELLS,LR    Unit division 00    Status of Unit ISSUED,FINAL    Transfusion Status OK TO TRANSFUSE    Crossmatch Result Compatible    Unit Number [id number]    Blood Component Type RED CELLS,LR    Unit division 00    Status of Unit ISSUED,FINAL    Transfusion Status OK TO TRANSFUSE    Crossmatch Result Compatible    Unit Number [id number]    Blood Component Type RBC LR PHER1    Unit division 00    Status of Unit ISSUED,FINAL    Transfusion Status OK TO TRANSFUSE    Crossmatch Result Compatible    Unit Number [id number]    Blood Component Type RED CELLS,LR    Unit division 00    Status of Unit ISSUED,FINAL    Transfusion Status OK TO TRANSFUSE    Crossmatch Result Compatible    Unit Number [id number]    Blood Component Type RBC LR PHER1    Unit division 00    Status of Unit REL FROM [facility]    Transfusion Status OK TO TRANSFUSE    Crossmatch Result COMPATIBLE    Unit Number [id number]    Blood Component Type RED CELLS,LR    Unit division 00    Status of Unit ISSUED,FINAL  Transfusion Status OK TO TRANSFUSE    Crossmatch Result COMPATIBLE    Unit Number [id number]    Blood Component Type RED CELLS,LR    Unit division 00    Status of Unit  ISSUED,FINAL    Transfusion Status OK TO TRANSFUSE    Crossmatch Result COMPATIBLE    Unit Number [id number]    Blood Component Type RED CELLS,LR    Unit division 00    Status of Unit ISSUED,FINAL    Transfusion Status OK TO TRANSFUSE    Crossmatch Result COMPATIBLE    Unit Number [id number]    Blood Component Type RBC LR PHER2    Unit division 00    Status of Unit ISSUED,FINAL    Transfusion Status OK TO TRANSFUSE    Crossmatch Result Compatible    Unit Number [id number]    Blood Component Type RBC LR PHER2    Unit division 00    Status of Unit ISSUED,FINAL    Transfusion Status OK TO TRANSFUSE    Crossmatch Result Compatible    Unit Number [id number]    Blood Component Type RED CELLS,LR    Unit division 00    Status of Unit REL FROM [facility]    Transfusion Status OK TO TRANSFUSE    Crossmatch Result COMPATIBLE    Unit Number [id number]    Blood Component Type RED CELLS,LR    Unit division 00    Status of Unit REL FROM [facility]    Transfusion Status OK TO TRANSFUSE    Crossmatch Result COMPATIBLE    Unit Number [id number]    Blood Component Type RED CELLS,LR    Unit division 00    Status of Unit REL FROM [facility]    Transfusion Status OK TO TRANSFUSE    Crossmatch Result Compatible    Unit Number [id number]    Blood Component Type RBC LR PHER2    Unit division 00    Status of Unit REL FROM [facility]    Transfusion Status OK TO TRANSFUSE    Crossmatch Result Compatible   ABO/Rh  Result Value Ref Range   ABO/RH(D) O NEG   Prepare fresh frozen plasma  Result Value Ref Range   Unit Number [id number]    Blood Component Type THAWED PLASMA    Unit division 00    Status of Unit ISSUED,FINAL    Transfusion Status OK TO TRANSFUSE    Unit Number [id number]    Blood Component Type THAWED PLASMA    Unit division 00    Status of Unit ISSUED,FINAL    Transfusion Status OK TO TRANSFUSE    Unit Number [id number]    Blood Component Type THAWED PLASMA     Unit division 00    Status of Unit ISSUED,FINAL    Transfusion Status OK TO TRANSFUSE    Unit Number [id number]    Blood Component Type THAWED PLASMA    Unit division 00    Status of Unit ISSUED,FINAL    Transfusion Status OK TO TRANSFUSE    Unit Number [id number]    Blood Component Type THAWED PLASMA    Unit division 00    Status of Unit ISSUED,FINAL    Transfusion Status OK TO TRANSFUSE    Unit Number [id number]    Blood Component Type THAWED PLASMA    Unit division 00    Status of Unit ISSUED,FINAL    Transfusion Status OK TO TRANSFUSE   Prepare platelet pheresis  Result Value Ref Range   Unit Number [id number]  Blood Component Type PLTPHER LR1    Unit division 00    Status of Unit ISSUED,FINAL    Transfusion Status OK TO TRANSFUSE   Prepare cryoprecipitate  Result Value Ref Range   Unit Number [id number]    Blood Component Type CRYPOOL THAW    Unit division 00    Status of Unit ISSUED,FINAL    Transfusion Status OK TO TRANSFUSE    Unit Number [id number]    Blood Component Type CRYPOOL THAW    Unit division 00    Status of Unit ISSUED,FINAL    Transfusion Status OK TO TRANSFUSE   Prepare fresh frozen plasma  Result Value Ref Range   Unit Number [id number]    Blood Component Type THAWED PLASMA    Unit division 00    Status of Unit ISSUED,FINAL    Transfusion Status OK TO TRANSFUSE    Unit Number [id number]    Blood Component Type THAWED PLASMA    Unit division 00    Status of Unit ISSUED,FINAL    Transfusion Status OK TO TRANSFUSE   Prepare Pheresed Platelets  Result Value Ref Range   Unit Number [id number]    Blood Component Type PLTPHER LRI1    Unit division 00    Status of Unit ISSUED,FINAL    Transfusion Status OK TO TRANSFUSE    Unit Number [id number]    Blood Component Type PLTPHER LR2    Unit division 00    Status of Unit ISSUED,FINAL    Transfusion Status OK TO TRANSFUSE   Prepare fresh frozen plasma  Result Value  Ref Range   Unit Number [id number]    Blood Component Type THAWED PLASMA    Unit division 00    Status of Unit ISSUED,FINAL    Transfusion Status OK TO TRANSFUSE    Unit Number [id number]    Blood Component Type THAWED PLASMA    Unit division 00    Status of Unit ISSUED,FINAL    Transfusion Status OK TO TRANSFUSE   Prepare RBC  Result Value Ref Range   Order Confirmation ORDER PROCESSED BY BLOOD BANK   Prepare RBC  Result Value Ref Range   Order Confirmation ORDER PROCESSED BY BLOOD BANK   Type and screen for Red Blood Exchange  Result Value Ref Range   ABO/RH(D) O NEG    Antibody Screen NEG    Sample Expiration 07/22/2014    Unit Number [id number]    Blood Component Type RED CELLS,LR    Unit division 00    Status of Unit ISSUED,FINAL    Transfusion Status OK TO TRANSFUSE    Crossmatch Result COMPATIBLE    Unit Number [id number]    Blood Component Type RED CELLS,LR    Unit division 00    Status of Unit ISSUED,FINAL    Transfusion Status OK TO TRANSFUSE    Crossmatch Result COMPATIBLE     Dg Chest Port 1 View  07/13/2014   CLINICAL DATA:  Central line placement.  EXAM: PORTABLE CHEST - 1 VIEW  COMPARISON:  02/03/2014  FINDINGS: Right internal jugular central venous line tip projects in the lower superior vena cava just above the caval atrial junction. There is no pneumothorax.  Endotracheal tube tip projects 2.8 cm above the chronic, well positioned. Nasogastric tube is also well-positioned passing well below the diaphragm into the stomach.  There is central interstitial thickening, but no focal lung consolidation. No pleural effusion or pneumothorax. Cardiac silhouette is normal in size. No gross mediastinal or  hilar masses.  IMPRESSION: 1. Central venous line and endotracheal tube and nasogastric tube are well positioned as described. No pneumothorax. 2. Central interstitial thickening. This is exaggerated by low lung volumes. This could reflect diffuse interstitial  infiltrate or interstitial edema.   Electronically Signed   By: Lajean Manes M.D.   On: 07/13/2014 16:24    Discharge Exam:  Filed Vitals:   07/23/14 0600  BP: 135/68  Pulse: 75  Temp: 98.3 F (36.8 C)  Resp: 16    General: WN older WM who is alert and generally healthy appearing.  Lungs: Clear to auscultation and symmetric breath sounds. Heart:  RRR. No murmur or rub. Abdomen: Soft.  Normal bowel sounds. Well healed midline incision.  Staples to come out.  Discharge Medications:     Medication List    TAKE these medications        aspirin EC 81 MG tablet  Take 81 mg by mouth at bedtime.     CALCIUM CITRATE PLUS/MAGNESIUM Tabs  Take 1 tablet by mouth every morning. Calcium citrate 500 mg, magnesium 800 mg, zinc 10 mg     CoQ10 100 MG Caps  Take 100-200 mg by mouth 2 (two) times daily. Take 1 tablet (100 mg) every morning and 2 tablets (200 mg) every night     fexofenadine 180 MG tablet  Commonly known as:  ALLEGRA  Take 180 mg by mouth every evening.     Fish Oil 1200 MG Caps  Take 2,400 mg by mouth 2 (two) times daily.     GNP GINGKO BILOBA EXTRACT PO  Take 120 mg by mouth 2 (two) times daily.     ibuprofen 200 MG tablet  Commonly known as:  ADVIL,MOTRIN  Take 400-600 mg by mouth every 6 (six) hours as needed for headache or moderate pain.     losartan-hydrochlorothiazide 100-25 MG per tablet  Commonly known as:  HYZAAR  Take 1 tablet by mouth every morning.     multivitamin with minerals Tabs tablet  Take 1 tablet by mouth every morning.     OVER THE COUNTER MEDICATION  Take 1 tablet by mouth at bedtime. Beta Sitosterol 375 mg     OVER THE COUNTER MEDICATION  Take 400-800 mg by mouth 2 (two) times daily. Bio Astin: 2 tablets (800 mg) every morning and 1 tablet (400 mg) every night     simvastatin 40 MG tablet  Commonly known as:  ZOCOR  Take 40 mg by mouth at bedtime.     traZODone 50 MG tablet  Commonly known as:  DESYREL  Take 50 mg by mouth at  bedtime.     vitamin C 500 MG tablet  Commonly known as:  ASCORBIC ACID  Take 500 mg by mouth every morning.     vitamin E 400 UNIT capsule  Take 400 Units by mouth every morning.        Disposition: 01-Home or Self Care      Discharge Instructions    Diet - low sodium heart healthy    Complete by:  As directed      Increase activity slowly    Complete by:  As directed            Follow-up Information    Follow up with [facility] In 2 weeks.   Specialty:  General Surgery   Contact information:   [address] [place] [address] [phone]        Signed: Shanon Brow  Lucia Gaskins, M.D., [facility] Surgery Office:  [phone]  07/23/2014, 7:24 AM

## 2014-07-23 NOTE — Discharge Summary (Signed)
Removed pt's staples and applied Benzoin and steri-strips.  Reviewed d/c instructions with pt and then wound care with wife who arrived later.  Discussed follow-up appointment, medications, incision care, and precautions.  Provided additional wound supplies (for JP drain and ML incision, if needed).  Answered all questions and then pt/spouse verbalized good understanding.  Pt being d/c to home into care of spouse.

## 2014-07-23 NOTE — Progress Notes (Addendum)
CARE MANAGEMENT NOTE 07/23/2014  Patient:  Darryl Griffith, Darryl Griffith   Account Number:  [id number]  Date Initiated:  07/14/2014  Documentation initiated by:  DAVIS,RHONDA  Subjective/Objective Assessment:   69 yo male L retroperitoneal tumor involving renal arteries. Requiring L nephrectomy and mobilization of pancreas to resect. Complicated by massive blood loss.     Action/Plan:   home when stable   Anticipated DC Date:  07/22/2014   Anticipated DC Plan:  [place] referral  NA      DC Planning Services  CM consult      [facility] Choice  HOME HEALTH   Choice offered to / List presented to:  C-1 Patient           [place].   Status of service:  Completed, signed off Medicare Important Message given?  YES (If response is "NO", the following Medicare IM given date fields will be blank) Date Medicare IM given:  07/17/2014 Medicare IM given by:  [facility] Date Additional Medicare IM given:  07/21/2014 Additional Medicare IM given by:  Marney Doctor  Discharge Disposition:  [place]  Per UR Regulation:  Reviewed for med. necessity/level of care/duration of stay  If discussed at [place] of Stay Meetings, dates discussed:    Comments:  07/23/2014 10:33 AM NCM spoke to pt and [facility] set up for [facility]. Contacted [facility] for DME for home. Pt will purchase shower stool out of pocket. DME not covered by insurance. Contacted [facility] to make aware of dc home today. Jonnie Finner RN CCM Case Mgmt phone [phone]     07/21/14 Marney Doctor RN,BSN,NCM [phone] PT is recommending HHPT. Pt would also like a HHRN to check on him once or twice when he gets home as well. Pt is requesting a RW,Cane,and tub bench.  Will need MD orders for [facility] and DME. Pt offered choice and chose [facility]. Referral for [facility] services called to [facility] rep. CM will continue to follow.  July 14, 2014/Rhonda L. Rosana Hoes, RN, BSN, CCM. Case Management [place] [phone] No discharge needs present of time of review. Vent day-1

## 2014-07-26 ENCOUNTER — Other Ambulatory Visit: Payer: Self-pay | Admitting: *Deleted

## 2014-07-26 DIAGNOSIS — R1900 Intra-abdominal and pelvic swelling, mass and lump, unspecified site: Secondary | ICD-10-CM

## 2014-07-26 NOTE — Progress Notes (Signed)
Notified Dr. Alen Blew of GI Tumor Board recommendation for radiation oncology referral. Verbal order to place referral.

## 2014-07-31 ENCOUNTER — Emergency Department (HOSPITAL_COMMUNITY): Payer: Medicare Other

## 2014-07-31 ENCOUNTER — Encounter (HOSPITAL_COMMUNITY): Payer: Self-pay

## 2014-07-31 ENCOUNTER — Inpatient Hospital Stay (HOSPITAL_COMMUNITY)
Admission: EM | Admit: 2014-07-31 | Discharge: 2014-08-04 | DRG: 356 | Disposition: A | Discharge status: Home Health | Payer: Medicare Other | Attending: General Surgery | Admitting: General Surgery

## 2014-07-31 DIAGNOSIS — E780 Pure hypercholesterolemia: Secondary | ICD-10-CM | POA: Diagnosis present

## 2014-07-31 DIAGNOSIS — K56609 Unspecified intestinal obstruction, unspecified as to partial versus complete obstruction: Secondary | ICD-10-CM

## 2014-07-31 DIAGNOSIS — R1110 Vomiting, unspecified: Secondary | ICD-10-CM

## 2014-07-31 DIAGNOSIS — K6811 Postprocedural retroperitoneal abscess: Secondary | ICD-10-CM | POA: Diagnosis present

## 2014-07-31 DIAGNOSIS — R188 Other ascites: Secondary | ICD-10-CM

## 2014-07-31 DIAGNOSIS — K913 Postprocedural intestinal obstruction: Principal | ICD-10-CM | POA: Diagnosis present

## 2014-07-31 DIAGNOSIS — T888XXA Other specified complications of surgical and medical care, not elsewhere classified, initial encounter: Secondary | ICD-10-CM

## 2014-07-31 DIAGNOSIS — I10 Essential (primary) hypertension: Secondary | ICD-10-CM | POA: Diagnosis present

## 2014-07-31 DIAGNOSIS — E43 Unspecified severe protein-calorie malnutrition: Secondary | ICD-10-CM | POA: Insufficient documentation

## 2014-07-31 DIAGNOSIS — R112 Nausea with vomiting, unspecified: Secondary | ICD-10-CM | POA: Diagnosis not present

## 2014-07-31 DIAGNOSIS — Z6829 Body mass index (BMI) 29.0-29.9, adult: Secondary | ICD-10-CM

## 2014-07-31 DIAGNOSIS — Z905 Acquired absence of kidney: Secondary | ICD-10-CM | POA: Diagnosis present

## 2014-07-31 DIAGNOSIS — R52 Pain, unspecified: Secondary | ICD-10-CM

## 2014-07-31 MED ORDER — SODIUM CHLORIDE 0.9 % IV BOLUS (SEPSIS)
1000.0000 mL | Freq: Once | INTRAVENOUS | Status: AC
  Administered 2014-08-01: 1000 mL via INTRAVENOUS

## 2014-07-31 MED ORDER — ONDANSETRON HCL 4 MG/2ML IJ SOLN
4.0000 mg | Freq: Once | INTRAMUSCULAR | Status: AC
  Administered 2014-08-01: 4 mg via INTRAVENOUS
  Filled 2014-07-31: qty 2

## 2014-07-31 NOTE — ED Notes (Signed)
Patient extremely weak and achy.  Decreased appetite as well as vomiting.  Decreased urine output.   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  Discharged home on 07/23/14.

## 2014-08-01 ENCOUNTER — Inpatient Hospital Stay (HOSPITAL_COMMUNITY): Payer: Medicare Other

## 2014-08-01 ENCOUNTER — Emergency Department (HOSPITAL_COMMUNITY): Payer: Medicare Other

## 2014-08-01 ENCOUNTER — Encounter (HOSPITAL_COMMUNITY): Payer: Self-pay | Admitting: Emergency Medicine

## 2014-08-01 DIAGNOSIS — K6811 Postprocedural retroperitoneal abscess: Secondary | ICD-10-CM | POA: Diagnosis present

## 2014-08-01 DIAGNOSIS — R188 Other ascites: Secondary | ICD-10-CM | POA: Insufficient documentation

## 2014-08-01 DIAGNOSIS — I10 Essential (primary) hypertension: Secondary | ICD-10-CM | POA: Diagnosis present

## 2014-08-01 DIAGNOSIS — Z905 Acquired absence of kidney: Secondary | ICD-10-CM | POA: Diagnosis present

## 2014-08-01 DIAGNOSIS — R112 Nausea with vomiting, unspecified: Secondary | ICD-10-CM | POA: Diagnosis present

## 2014-08-01 DIAGNOSIS — E780 Pure hypercholesterolemia: Secondary | ICD-10-CM | POA: Diagnosis present

## 2014-08-01 DIAGNOSIS — Z6829 Body mass index (BMI) 29.0-29.9, adult: Secondary | ICD-10-CM | POA: Diagnosis not present

## 2014-08-01 DIAGNOSIS — K56609 Unspecified intestinal obstruction, unspecified as to partial versus complete obstruction: Secondary | ICD-10-CM | POA: Diagnosis present

## 2014-08-01 DIAGNOSIS — K913 Postprocedural intestinal obstruction: Secondary | ICD-10-CM | POA: Diagnosis present

## 2014-08-01 DIAGNOSIS — E43 Unspecified severe protein-calorie malnutrition: Secondary | ICD-10-CM | POA: Diagnosis present

## 2014-08-01 LAB — CBC WITH DIFFERENTIAL/PLATELET
Basophils Absolute: 0 10*3/uL (ref 0.0–0.1)
Basophils Relative: 0 % (ref 0–1)
EOS PCT: 0 % (ref 0–5)
Eosinophils Absolute: 0.1 10*3/uL (ref 0.0–0.7)
HEMATOCRIT: 34.8 % — AB (ref 39.0–52.0)
Hemoglobin: 11.1 g/dL — ABNORMAL LOW (ref 13.0–17.0)
LYMPHS ABS: 1.1 10*3/uL (ref 0.7–4.0)
LYMPHS PCT: 9 % — AB (ref 12–46)
MCH: 28 pg (ref 26.0–34.0)
MCHC: 31.9 g/dL (ref 30.0–36.0)
MCV: 87.7 fL (ref 78.0–100.0)
MONO ABS: 0.9 10*3/uL (ref 0.1–1.0)
MONOS PCT: 7 % (ref 3–12)
Neutro Abs: 10.2 10*3/uL — ABNORMAL HIGH (ref 1.7–7.7)
Neutrophils Relative %: 84 % — ABNORMAL HIGH (ref 43–77)
Platelets: 786 10*3/uL — ABNORMAL HIGH (ref 150–400)
RBC: 3.97 MIL/uL — ABNORMAL LOW (ref 4.22–5.81)
RDW: 13.5 % (ref 11.5–15.5)
WBC: 12.2 10*3/uL — AB (ref 4.0–10.5)

## 2014-08-01 LAB — URINALYSIS, ROUTINE W REFLEX MICROSCOPIC
Bilirubin Urine: NEGATIVE
Glucose, UA: NEGATIVE mg/dL
HGB URINE DIPSTICK: NEGATIVE
Ketones, ur: NEGATIVE mg/dL
Leukocytes, UA: NEGATIVE
Nitrite: NEGATIVE
PROTEIN: NEGATIVE mg/dL
SPECIFIC GRAVITY, URINE: 1.021 (ref 1.005–1.030)
UROBILINOGEN UA: 1 mg/dL (ref 0.0–1.0)
pH: 6.5 (ref 5.0–8.0)

## 2014-08-01 LAB — CBC
HCT: 32.3 % — ABNORMAL LOW (ref 39.0–52.0)
Hemoglobin: 10.3 g/dL — ABNORMAL LOW (ref 13.0–17.0)
MCH: 28.1 pg (ref 26.0–34.0)
MCHC: 31.9 g/dL (ref 30.0–36.0)
MCV: 88.3 fL (ref 78.0–100.0)
PLATELETS: 754 10*3/uL — AB (ref 150–400)
RBC: 3.66 MIL/uL — ABNORMAL LOW (ref 4.22–5.81)
RDW: 13.8 % (ref 11.5–15.5)
WBC: 10.3 10*3/uL (ref 4.0–10.5)

## 2014-08-01 LAB — BASIC METABOLIC PANEL
Anion gap: 7 (ref 5–15)
BUN: 23 mg/dL — ABNORMAL HIGH (ref 6–20)
CO2: 26 mmol/L (ref 22–32)
CREATININE: 1.64 mg/dL — AB (ref 0.61–1.24)
Calcium: 9 mg/dL (ref 8.9–10.3)
Chloride: 98 mmol/L — ABNORMAL LOW (ref 101–111)
GFR, EST AFRICAN AMERICAN: 48 mL/min — AB (ref >60)
GFR, EST NON AFRICAN AMERICAN: 41 mL/min — AB (ref >60)
Glucose, Bld: 122 mg/dL — ABNORMAL HIGH (ref 70–99)
POTASSIUM: 4.1 mmol/L (ref 3.5–5.1)
Sodium: 131 mmol/L — ABNORMAL LOW (ref 135–145)

## 2014-08-01 LAB — I-STAT CHEM 8, ED
BUN: 23 mg/dL — AB (ref 6–20)
CHLORIDE: 94 mmol/L — AB (ref 101–111)
Calcium, Ion: 1.24 mmol/L (ref 1.13–1.30)
Creatinine, Ser: 1.5 mg/dL — ABNORMAL HIGH (ref 0.61–1.24)
Glucose, Bld: 123 mg/dL — ABNORMAL HIGH (ref 70–99)
HCT: 38 % — ABNORMAL LOW (ref 39.0–52.0)
Hemoglobin: 12.9 g/dL — ABNORMAL LOW (ref 13.0–17.0)
Potassium: 4.1 mmol/L (ref 3.5–5.1)
Sodium: 132 mmol/L — ABNORMAL LOW (ref 135–145)
TCO2: 23 mmol/L (ref 0–100)

## 2014-08-01 LAB — I-STAT CG4 LACTIC ACID, ED
Lactic Acid, Venous: 0.63 mmol/L (ref 0.5–2.0)
Lactic Acid, Venous: 0.64 mmol/L (ref 0.5–2.0)

## 2014-08-01 MED ORDER — MIDAZOLAM HCL 2 MG/2ML IJ SOLN
INTRAMUSCULAR | Status: AC | PRN
  Administered 2014-08-01: 0.5 mg via INTRAVENOUS
  Administered 2014-08-01: 1 mg via INTRAVENOUS

## 2014-08-01 MED ORDER — ONDANSETRON HCL 4 MG/2ML IJ SOLN: 4.0000 mg | INTRAMUSCULAR | Status: DC | PRN

## 2014-08-01 MED ORDER — IOHEXOL 300 MG/ML  SOLN: 50.0000 mL | Freq: Once | INTRAMUSCULAR | Status: AC | PRN

## 2014-08-01 MED ORDER — DEXTROSE-NACL 5-0.9 % IV SOLN
INTRAVENOUS | Status: DC
  Administered 2014-08-01 – 2014-08-03 (×6): via INTRAVENOUS
  Administered 2014-08-03: 125 mL/h via INTRAVENOUS

## 2014-08-01 MED ORDER — PHENOL 1.4 % MT LIQD
1.0000 | OROMUCOSAL | Status: DC | PRN
  Administered 2014-08-01: 1 via OROMUCOSAL
  Filled 2014-08-01: qty 177

## 2014-08-01 MED ORDER — SODIUM CHLORIDE 0.9 % IV BOLUS (SEPSIS)
1000.0000 mL | Freq: Once | INTRAVENOUS | Status: AC
  Administered 2014-08-01: 1000 mL via INTRAVENOUS

## 2014-08-01 MED ORDER — MORPHINE SULFATE 2 MG/ML IJ SOLN
2.0000 mg | INTRAMUSCULAR | Status: DC | PRN
  Administered 2014-08-01 – 2014-08-02 (×4): 4 mg via INTRAVENOUS
  Filled 2014-08-01 (×4): qty 2

## 2014-08-01 MED ORDER — MIDAZOLAM HCL 2 MG/2ML IJ SOLN
INTRAMUSCULAR | Status: AC
  Filled 2014-08-01: qty 6

## 2014-08-01 MED ORDER — PANTOPRAZOLE SODIUM 40 MG IV SOLR
40.0000 mg | Freq: Every day | INTRAVENOUS | Status: DC
  Administered 2014-08-01 – 2014-08-03 (×3): 40 mg via INTRAVENOUS
  Filled 2014-08-01 (×4): qty 40

## 2014-08-01 MED ORDER — IOHEXOL 300 MG/ML  SOLN: 80.0000 mL | Freq: Once | INTRAMUSCULAR | Status: DC | PRN

## 2014-08-01 MED ORDER — FENTANYL CITRATE (PF) 100 MCG/2ML IJ SOLN
INTRAMUSCULAR | Status: AC
  Filled 2014-08-01: qty 4

## 2014-08-01 MED ORDER — ENOXAPARIN SODIUM 40 MG/0.4ML ~~LOC~~ SOLN
40.0000 mg | SUBCUTANEOUS | Status: DC
  Administered 2014-08-01 – 2014-08-03 (×3): 40 mg via SUBCUTANEOUS
  Filled 2014-08-01 (×4): qty 0.4

## 2014-08-01 MED ORDER — IOHEXOL 300 MG/ML  SOLN: 50.0000 mL | Freq: Once | INTRAMUSCULAR | Status: DC | PRN

## 2014-08-01 MED ORDER — MORPHINE SULFATE 4 MG/ML IJ SOLN
4.0000 mg | Freq: Once | INTRAMUSCULAR | Status: AC
  Administered 2014-08-01: 4 mg via INTRAVENOUS
  Filled 2014-08-01: qty 1

## 2014-08-01 MED ORDER — ENOXAPARIN SODIUM 40 MG/0.4ML ~~LOC~~ SOLN
40.0000 mg | SUBCUTANEOUS | Status: DC
  Filled 2014-08-01: qty 0.4

## 2014-08-01 MED ORDER — ASPIRIN 325 MG PO TABS
325.0000 mg | ORAL_TABLET | Freq: Every day | ORAL | Status: DC
  Administered 2014-08-02 – 2014-08-04 (×3): 325 mg via ORAL
  Filled 2014-08-01 (×3): qty 1

## 2014-08-01 MED ORDER — FENTANYL CITRATE (PF) 100 MCG/2ML IJ SOLN
INTRAMUSCULAR | Status: AC | PRN
  Administered 2014-08-01: 25 ug via INTRAVENOUS
  Administered 2014-08-01: 75 ug via INTRAVENOUS

## 2014-08-01 NOTE — H&P (Signed)
Darryl Griffith is an 69 y.o. male.   Chief Complaint: Nausea and vomiting HPI: This is a 69 year old male who underwent resection of a left retroperitoneal mass and left nephrectomy 07/13/2014. 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 He went home 07/23/2014. Yesterday morning, he ate breakfast then began having chills and nausea and vomiting that persisted. He presented to the emergency department and was evaluated by the emergency department physician. Plain abdominal x-rays were performed and demonstrated what appeared to be partial small bowel traction. Last bowel movement was yesterday morning. An NG tube was placed. I was asked to see him. He denies fever. He denies abdominal pain or distention.  CT scan has been ordered by EDP.  Past Medical History  Diagnosis Date  . Hypertension   . Elevated cholesterol   . Anginal pain     hx of CP saw Dr Wynonia Lawman 09/20/2013 H&P per chart   . Left Retroperitoneal mass s/p resection/L nephrectomy 07/13/2014     Past Surgical History  Procedure Laterality Date  . Surgery for arm fracture Right 1952  . Knee arthroscopy Right 1970's  . Colonoscopy with propofol N/A 10/20/2013    Procedure: COLONOSCOPY WITH PROPOFOL;  Surgeon: Lear Ng, MD;  Location: [facility] ENDOSCOPY;  Service: Endoscopy;  Laterality: N/A;  . Hot hemostasis N/A 10/20/2013    Procedure: HOT HEMOSTASIS (ARGON PLASMA COAGULATION/BICAP);  Surgeon: Lear Ng, MD;  Location: [facility];  Service: Endoscopy;  Laterality: N/A;  . Tigger finger release      per H&P per Dr Wynonia Lawman 09/20/2013 (on chart)  . Resection of retroperitoneal mass Left 07/13/2014    Procedure: RESECTION OF left RETROPERITONEAL MASS;  Surgeon: Stark Klein, MD;  Location: [facility] ORS;  Service: General;  Laterality: Left;    Family History  Problem Relation Age of Onset  . Adopted: Yes  . Family history unknown: Yes   Social History:  reports that he has never smoked. He has never used smokeless  tobacco. He reports that he drinks alcohol. He reports that he does not use illicit drugs.  Allergies: No Known Allergies   (Not in a hospital admission)  Results for orders placed or performed during the hospital encounter of 07/31/14 (from the past 48 hour(s))  CBC with Differential/Platelet     Status: Abnormal   Collection Time: 08/01/14 12:13 AM  Result Value Ref Range   WBC 12.2 (H) 4.0 - 10.5 K/uL   RBC 3.97 (L) 4.22 - 5.81 MIL/uL   Hemoglobin 11.1 (L) 13.0 - 17.0 g/dL   HCT 34.8 (L) 39.0 - 52.0 %   MCV 87.7 78.0 - 100.0 fL   MCH 28.0 26.0 - 34.0 pg   MCHC 31.9 30.0 - 36.0 g/dL   RDW 13.5 11.5 - 15.5 %   Platelets 786 (H) 150 - 400 K/uL   Neutrophils Relative % 84 (H) 43 - 77 %   Neutro Abs 10.2 (H) 1.7 - 7.7 K/uL   Lymphocytes Relative 9 (L) 12 - 46 %   Lymphs Abs 1.1 0.7 - 4.0 K/uL   Monocytes Relative 7 3 - 12 %   Monocytes Absolute 0.9 0.1 - 1.0 K/uL   Eosinophils Relative 0 0 - 5 %   Eosinophils Absolute 0.1 0.0 - 0.7 K/uL   Basophils Relative 0 0 - 1 %   Basophils Absolute 0.0 0.0 - 0.1 K/uL  I-stat chem 8, ed     Status: Abnormal   Collection Time: 08/01/14 12:23 AM  Result Value Ref Range  Sodium 132 (L) 135 - 145 mmol/L   Potassium 4.1 3.5 - 5.1 mmol/L   Chloride 94 (L) 101 - 111 mmol/L   BUN 23 (H) 6 - 20 mg/dL   Creatinine, Ser 1.50 (H) 0.61 - 1.24 mg/dL   Glucose, Bld 123 (H) 70 - 99 mg/dL   Calcium, Ion 1.24 1.13 - 1.30 mmol/L   TCO2 23 0 - 100 mmol/L   Hemoglobin 12.9 (L) 13.0 - 17.0 g/dL   HCT 38.0 (L) 39.0 - 52.0 %  I-Stat CG4 Lactic Acid, ED     Status: None   Collection Time: 08/01/14 12:24 AM  Result Value Ref Range   Lactic Acid, Venous 0.63 0.5 - 2.0 mmol/L  Urinalysis, Routine w reflex microscopic     Status: None   Collection Time: 08/01/14  2:37 AM  Result Value Ref Range   Color, Urine YELLOW YELLOW   APPearance CLEAR CLEAR   Specific Gravity, Urine 1.021 1.005 - 1.030   pH 6.5 5.0 - 8.0   Glucose, UA NEGATIVE NEGATIVE mg/dL    Hgb urine dipstick NEGATIVE NEGATIVE   Bilirubin Urine NEGATIVE NEGATIVE   Ketones, ur NEGATIVE NEGATIVE mg/dL   Protein, ur NEGATIVE NEGATIVE mg/dL   Urobilinogen, UA 1.0 0.0 - 1.0 mg/dL   Nitrite NEGATIVE NEGATIVE   Leukocytes, UA NEGATIVE NEGATIVE    Comment: MICROSCOPIC NOT DONE ON URINES WITH NEGATIVE PROTEIN, BLOOD, LEUKOCYTES, NITRITE, OR GLUCOSE <1000 mg/dL.  I-Stat CG4 Lactic Acid, ED     Status: None   Collection Time: 08/01/14  3:06 AM  Result Value Ref Range   Lactic Acid, Venous 0.64 0.5 - 2.0 mmol/L   Dg Abd Acute W/chest  08/01/2014   CLINICAL DATA:  Week and generalized body pains.  EXAM: DG ABDOMEN ACUTE W/ 1V CHEST  COMPARISON:  07/13/2014  FINDINGS: There is abnormal small bowel dilatation and stacking, suspicious for partial or early obstruction. There is no free intraperitoneal air. There is stool and air throughout nondistended colon. The upright view the chest demonstrates atelectasis or infiltrate in the left base. The right lung is clear. There is a small left pleural effusion.  IMPRESSION: Abnormal bowel gas pattern suspicious for partial or early small bowel obstruction. Left lung base atelectasis or infiltrate. Small left pleural effusion.   Electronically Signed   By: Andreas Newport M.D.   On: 08/01/2014 00:11    Review of Systems  Constitutional: Positive for chills. Negative for fever.  Respiratory: Negative for shortness of breath.   Cardiovascular: Negative for chest pain.  Gastrointestinal: Positive for nausea and vomiting.  Genitourinary: Negative for hematuria.    Blood pressure 165/81, pulse 93, temperature 97.9 F (36.6 C), temperature source Oral, resp. rate 16, height 5\' 8"  (1.727 m), weight 85.73 kg (189 lb), SpO2 95 %. Physical Exam  Constitutional: He appears well-developed and well-nourished. No distress.  HENT:  Head: Normocephalic and atraumatic.  Ng tube in draining bilious fluid.  Cardiovascular: Normal rate and regular rhythm.    Respiratory: Effort normal.  GI: Soft. He exhibits distension. There is no tenderness.  Midline incision is clean and intact with Steri-Strips in place. He has intermittent high-pitched rushing bowel sounds.  Musculoskeletal: He exhibits no edema.  Neurological: He is alert.  Skin: Skin is warm and dry.     Assessment/Plan Postoperative partial small bowel obstruction by history and x-rays. Feels better with NG tube in. CT scan has been ordered by emergency department physician and is pending at this time.  Plan: Admit to the hospital. Continue NG tube. IV fluid hydration. Plan expectant management for now. We'll check the results of the CT later this morning after it has been done.  Leobardo Granlund J 08/01/2014, 3:24 AM

## 2014-08-01 NOTE — Progress Notes (Signed)
Patient ID: Darryl Griffith, male   DOB: 06-21-1945, 69 y.o.   MRN: [id number]    Referring Physician(s): CCS  Subjective:  Patient familiar to IR service from prior left retroperitoneal mass biopsy in March 2016. He is status post left nephrectomy/ retroperitoneal mass resection in April 2016 secondary to liposarcoma. He was recently admitted with chills, nausea, vomiting, partial small bowel obstruction and finding of fluid collection in left nephrectomy bed, possibly representing hematoma or abscess. Request has now been received from surgery for CT-guided aspiration and/or drainage of the fluid collection.  Allergies: Review of patient's allergies indicates no known allergies.  Medications: Prior to Admission medications   Medication Sig Start Date End Date Taking? Authorizing Provider  aspirin EC 81 MG tablet Take 81 mg by mouth at bedtime.   Yes Historical Provider, MD  Coenzyme Q10 (COQ10) 100 MG CAPS Take 100-200 mg by mouth 2 (two) times daily. Take 1 tablet (100 mg) every morning and 2 tablets (200 mg) every night   Yes Historical Provider, MD  fexofenadine (ALLEGRA) 180 MG tablet Take 180 mg by mouth daily as needed for allergies.    Yes Historical Provider, MD  Ginkgo Biloba (GNP GINGKO BILOBA EXTRACT PO) Take 120 mg by mouth 2 (two) times daily.   Yes Historical Provider, MD  ibuprofen (ADVIL,MOTRIN) 200 MG tablet Take 400-600 mg by mouth every 6 (six) hours as needed for headache or moderate pain.   Yes Historical Provider, MD  losartan-hydrochlorothiazide (HYZAAR) 100-25 MG per tablet Take 1 tablet by mouth every morning.    Yes Historical Provider, MD  Multiple Minerals-Vitamins (CALCIUM CITRATE PLUS/MAGNESIUM) TABS Take 1 tablet by mouth every morning. Calcium citrate 500 mg, magnesium 800 mg, zinc 10 mg   Yes Historical Provider, MD  Multiple Vitamin (MULTIVITAMIN WITH MINERALS) TABS tablet Take 1 tablet by mouth every morning.    Yes Historical Provider, MD  Omega-3 Fatty Acids  (FISH OIL) 1200 MG CAPS Take 1,200 mg by mouth 2 (two) times daily.    Yes Historical Provider, MD  OVER THE COUNTER MEDICATION Take 1 tablet by mouth at bedtime. Beta Sitosterol 375 mg   Yes Historical Provider, MD  OVER THE COUNTER MEDICATION Take 400-800 mg by mouth 2 (two) times daily. Bio Astin: 2 tablets (800 mg) every morning and 1 tablet (400 mg) every night   Yes Historical Provider, MD  oxyCODONE-acetaminophen (PERCOCET/ROXICET) 5-325 MG per tablet Take 1 tablet by mouth every 4 (four) hours as needed for severe pain.   Yes Historical Provider, MD  simvastatin (ZOCOR) 40 MG tablet Take 40 mg by mouth at bedtime.    Yes Historical Provider, MD  traZODone (DESYREL) 50 MG tablet Take 50 mg by mouth at bedtime.    Yes Historical Provider, MD  vitamin C (ASCORBIC ACID) 500 MG tablet Take 500 mg by mouth every morning.    Yes Historical Provider, MD  vitamin E 400 UNIT capsule Take 400 Units by mouth every morning.    Yes Historical Provider, MD     Vital Signs: BP 144/76 mmHg  Pulse 92  Temp(Src) 98.2 F (36.8 C) (Oral)  Resp 18  Ht 5\' 8"  (1.727 m)  Wt 191 lb 3.2 oz (86.728 kg)  BMI 29.08 kg/m2  SpO2 97%  Physical Exam patient awake, alert. NG tube in place. Slightly diminished breath sounds left base, right clear; heart with regular rate and rhythm. Abdomen soft, midline incision clean and dry with intact Steri-Strips, nontender, slightly distended; extremities with full range  of motion, no edema.  Imaging: Ct Abdomen Pelvis Wo Contrast  08/01/2014   CLINICAL DATA:  Week with generalized body aches. Recent left nephrectomy.  EXAM: CT ABDOMEN AND PELVIS WITHOUT CONTRAST  TECHNIQUE: Multidetector CT imaging of the abdomen and pelvis was performed following the standard protocol without IV contrast.  COMPARISON:  05/01/2014  FINDINGS: Nasogastric tube extends into the stomach.  There is a small left pleural effusion and mild left lung base consolidation which may be atelectatic.  There is  a 4.4 x 9.4 x 10.7 cm collection in the left nephrectomy bed. This could represent seroma or hematoma. There is no internal gas. Abscess cannot be excluded.  There is a small volume peritoneal fluid collected dependently in the pelvis and in the right pericolic gutter.  There are unremarkable unenhanced appearances of the liver, spleen, pancreas, right adrenal and right kidney. Gallbladder and bile ducts appear unremarkable. Mesentery appears unremarkable. There is mild nonspecific dilatation of proximal small bowel with gradual transition to decompressed distal small bowel. This is more likely nonobstructive. There is no free intraperitoneal air.  No significant skeletal abnormalities are evident.  IMPRESSION: *4.4 x 9.4 x 10.7 cm collection in the left nephrectomy bed, indeterminate with regard to seroma, hematoma or abscess. No internal gas. *Small volume ascites *Mild dilatation of proximal small bowel, more likely nonobstructive. *Small left pleural effusion and mild left base consolidation which might be atelectatic   Electronically Signed   By: Andreas Newport M.D.   On: 08/01/2014 04:41   Dg Abd Acute W/chest  08/01/2014   CLINICAL DATA:  Week and generalized body pains.  EXAM: DG ABDOMEN ACUTE W/ 1V CHEST  COMPARISON:  07/13/2014  FINDINGS: There is abnormal small bowel dilatation and stacking, suspicious for partial or early obstruction. There is no free intraperitoneal air. There is stool and air throughout nondistended colon. The upright view the chest demonstrates atelectasis or infiltrate in the left base. The right lung is clear. There is a small left pleural effusion.  IMPRESSION: Abnormal bowel gas pattern suspicious for partial or early small bowel obstruction. Left lung base atelectasis or infiltrate. Small left pleural effusion.   Electronically Signed   By: Andreas Newport M.D.   On: 08/01/2014 00:11    Labs:  CBC:  Recent Labs  07/18/14 0348 07/21/14 0502 08/01/14 0013  08/01/14 0023 08/01/14 0654  WBC 8.8 9.7 12.2*  --  10.3  HGB 7.0* 9.1* 11.1* 12.9* 10.3*  HCT 21.2* 27.6* 34.8* 38.0* 32.3*  PLT 267 462* 786*  --  754*    COAGS:  Recent Labs  07/13/14 1800 07/13/14 1939  07/14/14 1055 07/14/14 1830 07/15/14 0058 07/15/14 0515 07/16/14 0700  INR 1.42 1.42  --  1.20  --   --   --  1.21  APTT 40*  --   < > 33 36 31 33  --   < > = values in this interval not displayed.  BMP:  Recent Labs  07/17/14 0655 07/18/14 0348 07/21/14 0502 08/01/14 0023 08/01/14 0654  NA 138 137 136 132* 131*  K 3.3* 3.5 3.3* 4.1 4.1  CL 106 104 105 94* 98*  CO2 29 28 25   --  26  GLUCOSE 95 101* 102* 123* 122*  BUN 33* 30* 21 23* 23*  CALCIUM 7.5* 7.6* 7.9*  --  9.0  CREATININE 1.72* 1.60* 1.70* 1.50* 1.64*  GFRNONAA 39* 42* 39*  --  41*  GFRAA 45* 49* 46*  --  48*  LIVER FUNCTION TESTS:  Recent Labs  07/05/14 1350  BILITOT 0.6  AST 21  ALT 20  ALKPHOS 58  PROT 7.5  ALBUMIN 4.2    Assessment and Plan: Patient status post left nephrectomy/retroperitoneal mass section in April 2016 for liposarcoma. Presenting now with chills, nausea, vomiting, partial small bowel obstruction and fluid collection in left nephrectomy bed region, concerning for abscess. Request now received for CT-guided aspiration and/or drainage of the fluid collection. Patient is currently afebrile with a WBC of 10.3, creatinine of 1.64. Imaging studies have been reviewed and fluid collection appears to be amenable to aspiration and/or drainage. Details/risks of procedure, including but not limited to, internal bleeding, infection, injury to adjacent organs, discussed with patient with his understanding and consent. Procedure is scheduled for this morning.   Signed: Autumn Messing 08/01/2014, 9:05 AM   I spent a total of 15 minutes in face to face in clinical consultation/evaluation, greater than 50% of which was counseling/coordinating care for CT-guided aspiration/drainage of  left retroperitoneal fluid collection

## 2014-08-01 NOTE — Care Management Note (Signed)
Case Management Note  Patient Details  Name: Darryl Griffith MRN: [id number] Date of Birth: 1945/07/13  Subjective/Objective:                69 year old male who underwent resection of a left retroperitoneal mass and left nephrectomy 07/13/2014. 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 He went home 07/23/2014. Yesterday morning, he ate breakfast then began having chills and nausea and vomiting that persisted. He presented to the emergency department and was evaluated by the emergency department physician. Plain abdominal x-rays were performed and demonstrated what appeared to be partial small bowel traction. Last bowel movement was yesterday morning. An NG tube was placed. I was asked to see him. He denies fever. He denies abdominal pain or distention. CT scan has been ordered by EDP.    Action/Plan: Will follow for needs  Expected Discharge Date:  08/04/14               Expected Discharge Plan:  Home/Self Care  In-House Referral:  NA  Discharge planning Services  CM Consult  Post Acute Care Choice:  NA Choice offered to:  NA  DME Arranged:  N/A DME Agency:  NA  HH Arranged:  NA HH Agency:  NA  Status of Service:  In process, will continue to follow  Medicare Important Message Given:    Date Medicare IM Given:    Medicare IM give by:    Date Additional Medicare IM Given:    Additional Medicare Important Message give by:     If discussed at [place] of Stay Meetings, dates discussed:    Additional Comments:  Leeroy Cha, RN 08/01/2014, 3:20 PM [phone]

## 2014-08-01 NOTE — ED Provider Notes (Signed)
CSN: [id number]     Arrival date & time 07/31/14  2220 History   First MD Initiated Contact with Patient 07/31/14 2305     Chief Complaint  Patient presents with  . Emesis     (Consider location/radiation/quality/duration/timing/severity/associated sxs/prior Treatment) Patient is a 69 y.o. male presenting with vomiting. The history is provided by the patient.  Emesis Severity:  Severe Duration:  20 hours Timing:  Intermittent Quality:  Stomach contents Progression:  Unchanged Chronicity:  New Recent urination:  Decreased Context: not post-tussive   Relieved by:  Nothing Worsened by:  Nothing tried Ineffective treatments:  None tried Associated symptoms: no diarrhea, no fever and no sore throat   Risk factors: prior abdominal surgery     Past Medical History  Diagnosis Date  . Hypertension   . Elevated cholesterol   . Anginal pain     hx of CP saw Dr Wynonia Lawman 09/20/2013 H&P per chart   . 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 Surgery for arm fracture Right 1952  . Knee arthroscopy Right 1970's  . Colonoscopy with propofol N/A 10/20/2013    Procedure: COLONOSCOPY WITH PROPOFOL;  Surgeon: Lear Ng, MD;  Location: [facility];  Service: Endoscopy;  Laterality: N/A;  . Hot hemostasis N/A 10/20/2013    Procedure: HOT HEMOSTASIS (ARGON PLASMA COAGULATION/BICAP);  Surgeon: Lear Ng, MD;  Location: [facility];  Service: Endoscopy;  Laterality: N/A;  . Tigger finger release      per H&P per Dr Wynonia Lawman 09/20/2013 (on chart)  . Resection of retroperitoneal mass Left 07/13/2014    Procedure: RESECTION OF left RETROPERITONEAL MASS;  Surgeon: Stark Klein, MD;  Location: [facility];  Service: General;  Laterality: Left;   Family History  Problem Relation Age of Onset  . Adopted: Yes  . Family history unknown: Yes   History  Substance Use Topics  . Smoking status: Never Smoker   .  Smokeless tobacco: Never Used  . Alcohol Use: Yes     Comment: 2-3 beers per night    Review of Systems  HENT: Negative for sore throat.   Gastrointestinal: Positive for vomiting. Negative for diarrhea.  All other systems reviewed and are negative.     Allergies  Review of patient's allergies indicates no known allergies.  Home Medications   Prior to Admission medications   Medication Sig Start Date End Date Taking? Authorizing Provider  aspirin EC 81 MG tablet Take 81 mg by mouth at bedtime.   Yes Historical Provider, MD  Coenzyme Q10 (COQ10) 100 MG CAPS Take 100-200 mg by mouth 2 (two) times daily. Take 1 tablet (100 mg) every morning and 2 tablets (200 mg) every night   Yes Historical Provider, MD  fexofenadine (ALLEGRA) 180 MG tablet Take 180 mg by mouth daily as needed for allergies.    Yes Historical Provider, MD  Ginkgo Biloba (GNP GINGKO BILOBA EXTRACT PO) Take 120 mg by mouth 2 (two) times daily.   Yes Historical Provider, MD  ibuprofen (ADVIL,MOTRIN) 200 MG tablet Take 400-600 mg by mouth every 6 (six) hours as needed for headache or moderate pain.   Yes Historical Provider, MD  losartan-hydrochlorothiazide (HYZAAR) 100-25 MG per tablet Take 1 tablet by mouth every morning.    Yes Historical Provider, MD  Multiple Minerals-Vitamins (CALCIUM CITRATE PLUS/MAGNESIUM) TABS Take 1 tablet by mouth every morning. Calcium citrate 500 mg, magnesium 800 mg, zinc 10 mg   Yes  Historical Provider, MD  Multiple Vitamin (MULTIVITAMIN WITH MINERALS) TABS tablet Take 1 tablet by mouth every morning.    Yes Historical Provider, MD  Omega-3 Fatty Acids (FISH OIL) 1200 MG CAPS Take 1,200 mg by mouth 2 (two) times daily.    Yes Historical Provider, MD  OVER THE COUNTER MEDICATION Take 1 tablet by mouth at bedtime. Beta Sitosterol 375 mg   Yes Historical Provider, MD  OVER THE COUNTER MEDICATION Take 400-800 mg by mouth 2 (two) times daily. Bio Astin: 2 tablets (800 mg) every morning and 1 tablet  (400 mg) every night   Yes Historical Provider, MD  oxyCODONE-acetaminophen (PERCOCET/ROXICET) 5-325 MG per tablet Take 1 tablet by mouth every 4 (four) hours as needed for severe pain.   Yes Historical Provider, MD  simvastatin (ZOCOR) 40 MG tablet Take 40 mg by mouth at bedtime.    Yes Historical Provider, MD  traZODone (DESYREL) 50 MG tablet Take 50 mg by mouth at bedtime.    Yes Historical Provider, MD  vitamin C (ASCORBIC ACID) 500 MG tablet Take 500 mg by mouth every morning.    Yes Historical Provider, MD  vitamin E 400 UNIT capsule Take 400 Units by mouth every morning.    Yes Historical Provider, MD   BP 126/69 mmHg  Pulse 111  Temp(Src) 97.9 F (36.6 C) (Oral)  Resp 18  Ht 5\' 8"  (1.727 m)  Wt 189 lb (85.73 kg)  BMI 28.74 kg/m2  SpO2 100% Physical Exam  Constitutional: He is oriented to person, place, and time. He appears well-developed and well-nourished. No distress.  HENT:  Head: Normocephalic and atraumatic.  Mouth/Throat: Oropharynx is clear and moist.  Eyes: Conjunctivae are normal. Pupils are equal, round, and reactive to light.  Neck: Normal range of motion. Neck supple.  Cardiovascular: Normal rate and regular rhythm.   Pulmonary/Chest: Effort normal and breath sounds normal. No respiratory distress. He has no wheezes. He has no rales.  Abdominal: Soft. He exhibits no mass. There is no rebound and no guarding.  Tickling bowel sounds consistent with SBO  Musculoskeletal: Normal range of motion.  Neurological: He is alert and oriented to person, place, and time.  Skin: Skin is warm and dry.  Psychiatric: He has a normal mood and affect.    ED Course  Procedures (including critical care time) Labs Review Labs Reviewed  CBC WITH DIFFERENTIAL/PLATELET - Abnormal; Notable for the following:    WBC 12.2 (*)    RBC 3.97 (*)    Hemoglobin 11.1 (*)    HCT 34.8 (*)    Platelets 786 (*)    Neutrophils Relative % 84 (*)    Neutro Abs 10.2 (*)    Lymphocytes Relative  9 (*)    All other components within normal limits  I-STAT CHEM 8, ED - Abnormal; Notable for the following:    Sodium 132 (*)    Chloride 94 (*)    BUN 23 (*)    Creatinine, Ser 1.50 (*)    Glucose, Bld 123 (*)    Hemoglobin 12.9 (*)    HCT 38.0 (*)    All other components within normal limits  URINALYSIS, ROUTINE W REFLEX MICROSCOPIC  I-STAT CG4 LACTIC ACID, ED  I-STAT CG4 LACTIC ACID, ED    Imaging Review Dg Abd Acute W/chest  08/01/2014   CLINICAL DATA:  Week and generalized body pains.  EXAM: DG ABDOMEN ACUTE W/ 1V CHEST  COMPARISON:  07/13/2014  FINDINGS: There is abnormal small bowel dilatation  and stacking, suspicious for partial or early obstruction. There is no free intraperitoneal air. There is stool and air throughout nondistended colon. The upright view the chest demonstrates atelectasis or infiltrate in the left base. The right lung is clear. There is a small left pleural effusion.  IMPRESSION: Abnormal bowel gas pattern suspicious for partial or early small bowel obstruction. Left lung base atelectasis or infiltrate. Small left pleural effusion.   Electronically Signed   By: Andreas Newport M.D.   On: 08/01/2014 00:11     EKG Interpretation None      MDM   Final diagnoses:  Vomiting  Pain  Small bowel obstruction    Results for orders placed or performed during the hospital encounter of 07/31/14  CBC with Differential/Platelet  Result Value Ref Range   WBC 12.2 (H) 4.0 - 10.5 K/uL   RBC 3.97 (L) 4.22 - 5.81 MIL/uL   Hemoglobin 11.1 (L) 13.0 - 17.0 g/dL   HCT 34.8 (L) 39.0 - 52.0 %   MCV 87.7 78.0 - 100.0 fL   MCH 28.0 26.0 - 34.0 pg   MCHC 31.9 30.0 - 36.0 g/dL   RDW 13.5 11.5 - 15.5 %   Platelets 786 (H) 150 - 400 K/uL   Neutrophils Relative % 84 (H) 43 - 77 %   Neutro Abs 10.2 (H) 1.7 - 7.7 K/uL   Lymphocytes Relative 9 (L) 12 - 46 %   Lymphs Abs 1.1 0.7 - 4.0 K/uL   Monocytes Relative 7 3 - 12 %   Monocytes Absolute 0.9 0.1 - 1.0 K/uL    Eosinophils Relative 0 0 - 5 %   Eosinophils Absolute 0.1 0.0 - 0.7 K/uL   Basophils Relative 0 0 - 1 %   Basophils Absolute 0.0 0.0 - 0.1 K/uL  Urinalysis, Routine w reflex microscopic  Result Value Ref Range   Color, Urine YELLOW YELLOW   APPearance CLEAR CLEAR   Specific Gravity, Urine 1.021 1.005 - 1.030   pH 6.5 5.0 - 8.0   Glucose, UA NEGATIVE NEGATIVE mg/dL   Hgb urine dipstick NEGATIVE NEGATIVE   Bilirubin Urine NEGATIVE NEGATIVE   Ketones, ur NEGATIVE NEGATIVE mg/dL   Protein, ur NEGATIVE NEGATIVE mg/dL   Urobilinogen, UA 1.0 0.0 - 1.0 mg/dL   Nitrite NEGATIVE NEGATIVE   Leukocytes, UA NEGATIVE NEGATIVE  I-stat chem 8, ed  Result Value Ref Range   Sodium 132 (L) 135 - 145 mmol/L   Potassium 4.1 3.5 - 5.1 mmol/L   Chloride 94 (L) 101 - 111 mmol/L   BUN 23 (H) 6 - 20 mg/dL   Creatinine, Ser 1.50 (H) 0.61 - 1.24 mg/dL   Glucose, Bld 123 (H) 70 - 99 mg/dL   Calcium, Ion 1.24 1.13 - 1.30 mmol/L   TCO2 23 0 - 100 mmol/L   Hemoglobin 12.9 (L) 13.0 - 17.0 g/dL   HCT 38.0 (L) 39.0 - 52.0 %  I-Stat CG4 Lactic Acid, ED  Result Value Ref Range   Lactic Acid, Venous 0.63 0.5 - 2.0 mmol/L  I-Stat CG4 Lactic Acid, ED  Result Value Ref Range   Lactic Acid, Venous 0.64 0.5 - 2.0 mmol/L   Dg Chest Port 1 View  07/13/2014   CLINICAL DATA:  Central line placement.  EXAM: PORTABLE CHEST - 1 VIEW  COMPARISON:  02/03/2014  FINDINGS: Right internal jugular central venous line tip projects in the lower superior vena cava just above the caval atrial junction. There is no pneumothorax.  Endotracheal tube tip  projects 2.8 cm above the chronic, well positioned. Nasogastric tube is also well-positioned passing well below the diaphragm into the stomach.  There is central interstitial thickening, but no focal lung consolidation. No pleural effusion or pneumothorax. Cardiac silhouette is normal in size. No gross mediastinal or hilar masses.  IMPRESSION: 1. Central venous line and endotracheal tube and  nasogastric tube are well positioned as described. No pneumothorax. 2. Central interstitial thickening. This is exaggerated by low lung volumes. This could reflect diffuse interstitial infiltrate or interstitial edema.   Electronically Signed   By: Lajean Manes M.D.   On: 07/13/2014 16:24   Dg Abd Acute W/chest  08/01/2014   CLINICAL DATA:  Week and generalized body pains.  EXAM: DG ABDOMEN ACUTE W/ 1V CHEST  COMPARISON:  07/13/2014  FINDINGS: There is abnormal small bowel dilatation and stacking, suspicious for partial or early obstruction. There is no free intraperitoneal air. There is stool and air throughout nondistended colon. The upright view the chest demonstrates atelectasis or infiltrate in the left base. The right lung is clear. There is a small left pleural effusion.  IMPRESSION: Abnormal bowel gas pattern suspicious for partial or early small bowel obstruction. Left lung base atelectasis or infiltrate. Small left pleural effusion.   Electronically Signed   By: Andreas Newport M.D.   On: 08/01/2014 00:11    Medications  enoxaparin (LOVENOX) injection 40 mg (not administered)  dextrose 5 %-0.9 % sodium chloride infusion (not administered)  morphine 2 MG/ML injection 2-4 mg (not administered)  ondansetron (ZOFRAN) injection 4 mg (not administered)  pantoprazole (PROTONIX) injection 40 mg (not administered)  phenol (CHLORASEPTIC) mouth spray 1 spray (not administered)  iohexol (OMNIPAQUE) 300 MG/ML solution 50 mL (not administered)  sodium chloride 0.9 % bolus 1,000 mL (0 mLs Intravenous Stopped 08/01/14 0247)  ondansetron (ZOFRAN) injection 4 mg (4 mg Intravenous Given 08/01/14 0011)  sodium chloride 0.9 % bolus 1,000 mL (1,000 mLs Intravenous New Bag/Given 08/01/14 0247)  morphine 4 MG/ML injection 4 mg (4 mg Intravenous Given 08/01/14 0247)       Shiori Adcox, MD 08/01/14 [phone]

## 2014-08-01 NOTE — Procedures (Signed)
L retroperitoneal abscess drain 10 fr Dark fluid No comp

## 2014-08-01 NOTE — Progress Notes (Signed)
Initial Nutrition Assessment  DOCUMENTATION CODES:  Severe malnutrition in context of acute illness/injury  INTERVENTION:  Diet advancement per MD If pt expected to remain NPO for > 7days, consider nutrition support. RD to continue to monitor  NUTRITION DIAGNOSIS:  MALNUTRITION in the context of acute illness as evidenced by 9% weight loss x 3 weeks and energy intake <50% for >/= 5 days.  GOAL:  Patient will meet greater than or equal to 90% of their needs  MONITOR:  Diet advancement, Labs, Weight trends, Skin, I & O's  REASON FOR ASSESSMENT:  Malnutrition Screening Tool    ASSESSMENT: 69 year old male who underwent resection of a left retroperitoneal mass and left nephrectomy 07/13/2014. 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 He went home 07/23/2014. Yesterday morning, he ate breakfast then began having chills and nausea and vomiting that persisted.    Pt in room with wife at bedside. Per wife, pt has had no appetite and taste changes since his surgery on 4/14. Pt was eating until a couple of days ago. Pt's wife bought Ensure supplements for pt but he did not drink them but one time. Per patient, he has had 18-20 lb weight loss since surgery (9% weight loss x 3 weeks, significant for time frame). Pt would like to try strawberry Ensure when his diet is advanced. RD to monitor for diet advancement and plan.  Labs reviewed: Low Na Elevated BUN & Creatinine  Height:  Ht Readings from Last 1 Encounters:  08/01/14 5\' 8"  (1.727 m)    Weight:  Wt Readings from Last 1 Encounters:  08/01/14 191 lb 3.2 oz (86.728 kg)    Ideal Body Weight:  70 kg  Wt Readings from Last 10 Encounters:  08/01/14 191 lb 3.2 oz (86.728 kg)  07/13/14 211 lb 4 oz (95.822 kg)  05/11/14 217 lb 14.4 oz (98.839 kg)  10/20/13 204 lb (92.534 kg)    BMI:  Body mass index is 29.08 kg/(m^2).  Estimated Nutritional Needs:  Kcal:  1900-2100  Protein:  75-85g  Fluid:  1.9L/day      Skin:  Wound (see comment) (abdominal incision)  Diet Order:  Diet NPO time specified Except for: Ice Chips  EDUCATION NEEDS:  No education needs identified at this time   Intake/Output Summary (Last 24 hours) at 08/01/14 1618 Last data filed at 08/01/14 1610  Gross per 24 hour  Intake      0 ml  Output    640 ml  Net   -640 ml    Last BM:  5/2  Clayton Bibles, MS, RD, LDN Pager: [phone] After Hours Pager: [phone]

## 2014-08-02 ENCOUNTER — Encounter: Payer: Self-pay | Admitting: Radiation Oncology

## 2014-08-02 DIAGNOSIS — E43 Unspecified severe protein-calorie malnutrition: Secondary | ICD-10-CM | POA: Insufficient documentation

## 2014-08-02 LAB — BASIC METABOLIC PANEL
Anion gap: 9 (ref 5–15)
BUN: 18 mg/dL (ref 6–20)
CO2: 28 mmol/L (ref 22–32)
Calcium: 9.3 mg/dL (ref 8.9–10.3)
Chloride: 101 mmol/L (ref 101–111)
Creatinine, Ser: 1.54 mg/dL — ABNORMAL HIGH (ref 0.61–1.24)
GFR calc Af Amer: 51 mL/min — ABNORMAL LOW (ref >60)
GFR calc non Af Amer: 44 mL/min — ABNORMAL LOW (ref >60)
Glucose, Bld: 123 mg/dL — ABNORMAL HIGH (ref 70–99)
Potassium: 4.3 mmol/L (ref 3.5–5.1)
Sodium: 138 mmol/L (ref 135–145)

## 2014-08-02 LAB — CBC
HEMATOCRIT: 32.3 % — AB (ref 39.0–52.0)
Hemoglobin: 10.1 g/dL — ABNORMAL LOW (ref 13.0–17.0)
MCH: 28.3 pg (ref 26.0–34.0)
MCHC: 31.3 g/dL (ref 30.0–36.0)
MCV: 90.5 fL (ref 78.0–100.0)
Platelets: 693 10*3/uL — ABNORMAL HIGH (ref 150–400)
RBC: 3.57 MIL/uL — ABNORMAL LOW (ref 4.22–5.81)
RDW: 13.8 % (ref 11.5–15.5)
WBC: 6.5 10*3/uL (ref 4.0–10.5)

## 2014-08-02 MED ORDER — TRAZODONE HCL 50 MG PO TABS
50.0000 mg | ORAL_TABLET | Freq: Every day | ORAL | Status: DC
  Administered 2014-08-03 (×2): 50 mg via ORAL
  Filled 2014-08-02 (×4): qty 1

## 2014-08-02 NOTE — Progress Notes (Signed)
GI Location of Tumor / Histology: dedifferentiated liposarcoma  Darryl Griffith presented September 2015 with nagging abdominal pain.   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 Barry Dienes on 07/13/2014  Past/Anticipated interventions by medical oncology, if any:   Weight changes, if any:   Bowel/Bladder complaints, if any: no  Nausea / Vomiting, if any: readmitted with nausea and vomiting following surgery   Pain issues, if any:    SAFETY ISSUES:  Prior radiation? no  Pacemaker/ICD? no  Possible current pregnancy? no  Is the patient on methotrexate? no  Current Complaints / other details:  69 year old male. Married. Adopted.

## 2014-08-02 NOTE — Progress Notes (Signed)
Subjective: Pt is doing better today.  IR did aspirate/place drain in LUQ/retroperitoneal fluid collection.  Looked like old blood/sent for culture.    Objective: Vital signs in last 24 hours: Temp:  [98 F (36.7 C)-98.7 F (37.1 C)] 98.4 F (36.9 C) (05/04 0919) Pulse Rate:  [82-89] 83 (05/04 0919) Resp:  [18] 18 (05/04 0919) BP: (124-157)/(68-83) 139/83 mmHg (05/04 0919) SpO2:  [95 %-98 %] 97 % (05/04 0919) Last BM Date: 07/31/14  Intake/Output from previous day: 05/03 0701 - 05/04 0700 In: 1000 [I.V.:1000] Out: 1390 [Urine:1050; Emesis/NG output:100; Drains:240] Intake/Output this shift:    General appearance: alert, cooperative and no distress Resp: breathing comfortably GI: soft, sl distended, drain looks like old blood.  Lab Results:   Recent Labs  08/01/14 0654 08/02/14 1100  WBC 10.3 6.5  HGB 10.3* 10.1*  HCT 32.3* 32.3*  PLT 754* 693*   BMET  Recent Labs  08/01/14 0023 08/01/14 0654  NA 132* 131*  K 4.1 4.1  CL 94* 98*  CO2  --  26  GLUCOSE 123* 122*  BUN 23* 23*  CREATININE 1.50* 1.64*  CALCIUM  --  9.0   PT/INR No results for input(s): LABPROT, INR in the last 72 hours. ABG No results for input(s): PHART, HCO3 in the last 72 hours.  Invalid input(s): PCO2, PO2  Studies/Results: Ct Abdomen Pelvis Wo Contrast  08/01/2014   CLINICAL DATA:  Week with generalized body aches. Recent left nephrectomy.  EXAM: CT ABDOMEN AND PELVIS WITHOUT CONTRAST  TECHNIQUE: Multidetector CT imaging of the abdomen and pelvis was performed following the standard protocol without IV contrast.  COMPARISON:  05/01/2014  FINDINGS: Nasogastric tube extends into the stomach.  There is a small left pleural effusion and mild left lung base consolidation which may be atelectatic.  There is a 4.4 x 9.4 x 10.7 cm collection in the left nephrectomy bed. This could represent seroma or hematoma. There is no internal gas. Abscess cannot be excluded.  There is a small volume  peritoneal fluid collected dependently in the pelvis and in the right pericolic gutter.  There are unremarkable unenhanced appearances of the liver, spleen, pancreas, right adrenal and right kidney. Gallbladder and bile ducts appear unremarkable. Mesentery appears unremarkable. There is mild nonspecific dilatation of proximal small bowel with gradual transition to decompressed distal small bowel. This is more likely nonobstructive. There is no free intraperitoneal air.  No significant skeletal abnormalities are evident.  IMPRESSION: *4.4 x 9.4 x 10.7 cm collection in the left nephrectomy bed, indeterminate with regard to seroma, hematoma or abscess. No internal gas. *Small volume ascites *Mild dilatation of proximal small bowel, more likely nonobstructive. *Small left pleural effusion and mild left base consolidation which might be atelectatic   Electronically Signed   By: Andreas Newport M.D.   On: 08/01/2014 04:41   Dg Abd Acute W/chest  08/01/2014   CLINICAL DATA:  Week and generalized body pains.  EXAM: DG ABDOMEN ACUTE W/ 1V CHEST  COMPARISON:  07/13/2014  FINDINGS: There is abnormal small bowel dilatation and stacking, suspicious for partial or early obstruction. There is no free intraperitoneal air. There is stool and air throughout nondistended colon. The upright view the chest demonstrates atelectasis or infiltrate in the left base. The right lung is clear. There is a small left pleural effusion.  IMPRESSION: Abnormal bowel gas pattern suspicious for partial or early small bowel obstruction. Left lung base atelectasis or infiltrate. Small left pleural effusion.   Electronically Signed  By: Andreas Newport M.D.   On: 08/01/2014 00:11   Ct Image Guided Drainage Percut Cath  Peritoneal Retroperit  08/01/2014   CLINICAL DATA:  Left renal fossa abscess  EXAM: CT CORE BIOPSY RENAL  FLUOROSCOPY TIME:  None  MEDICATIONS AND MEDICAL HISTORY: Versed 1.5 mg, Fentanyl 100 mcg.  Additional Medications: None.   ANESTHESIA/SEDATION: Moderate sedation time: 11 minutes  CONTRAST:  None  PROCEDURE: The procedure, risks, benefits, and alternatives were explained to the patient. Questions regarding the procedure were encouraged and answered. The patient understands and consents to the procedure.  The back was prepped with Betadine in a sterile fashion, and a sterile drape was applied covering the operative field. A sterile gown and sterile gloves were used for the procedure.  Under CT guidance, an 18 gauge needle was inserted into the left renal fossa abscess. It was removed over an Amplatz wire. A 12 French dilator followed by a 12 [place] drain were inserted. It was looped and string fixed then sewn to the skin. Dark fluid was aspirated.  FINDINGS: Images document 9 French drain placement into a left renal fossae abscess.  COMPLICATIONS: None  IMPRESSION: Successful left renal fossa abscess drainage.   Electronically Signed   By: Marybelle Killings M.D.   On: 08/01/2014 13:13    Anti-infectives: Anti-infectives    None      Assessment/Plan: s/p * No surgery found * d/c NGT  Advance diet Await culture Ambulate.   LOS: 1 day    [facility] 08/02/2014

## 2014-08-02 NOTE — Progress Notes (Signed)
Patient ID: Darryl Griffith, male   DOB: 05/24/1945, 69 y.o.   MRN: [id number]    Referring Physician(s): CCS  Subjective: Pt feeling better today; NG out; has ambulated; denies N/V; tol liquid diet  Allergies: Review of patient's allergies indicates no known allergies.  Medications: Prior to Admission medications   Medication Sig Start Date End Date Taking? Authorizing Provider  aspirin EC 81 MG tablet Take 81 mg by mouth at bedtime.   Yes Historical Provider, MD  Coenzyme Q10 (COQ10) 100 MG CAPS Take 100-200 mg by mouth 2 (two) times daily. Take 1 tablet (100 mg) every morning and 2 tablets (200 mg) every night   Yes Historical Provider, MD  fexofenadine (ALLEGRA) 180 MG tablet Take 180 mg by mouth daily as needed for allergies.    Yes Historical Provider, MD  Ginkgo Biloba (GNP GINGKO BILOBA EXTRACT PO) Take 120 mg by mouth 2 (two) times daily.   Yes Historical Provider, MD  ibuprofen (ADVIL,MOTRIN) 200 MG tablet Take 400-600 mg by mouth every 6 (six) hours as needed for headache or moderate pain.   Yes Historical Provider, MD  losartan-hydrochlorothiazide (HYZAAR) 100-25 MG per tablet Take 1 tablet by mouth every morning.    Yes Historical Provider, MD  Multiple Minerals-Vitamins (CALCIUM CITRATE PLUS/MAGNESIUM) TABS Take 1 tablet by mouth every morning. Calcium citrate 500 mg, magnesium 800 mg, zinc 10 mg   Yes Historical Provider, MD  Multiple Vitamin (MULTIVITAMIN WITH MINERALS) TABS tablet Take 1 tablet by mouth every morning.    Yes Historical Provider, MD  Omega-3 Fatty Acids (FISH OIL) 1200 MG CAPS Take 1,200 mg by mouth 2 (two) times daily.    Yes Historical Provider, MD  OVER THE COUNTER MEDICATION Take 1 tablet by mouth at bedtime. Beta Sitosterol 375 mg   Yes Historical Provider, MD  OVER THE COUNTER MEDICATION Take 400-800 mg by mouth 2 (two) times daily. Bio Astin: 2 tablets (800 mg) every morning and 1 tablet (400 mg) every night   Yes Historical Provider, MD    oxyCODONE-acetaminophen (PERCOCET/ROXICET) 5-325 MG per tablet Take 1 tablet by mouth every 4 (four) hours as needed for severe pain.   Yes Historical Provider, MD  simvastatin (ZOCOR) 40 MG tablet Take 40 mg by mouth at bedtime.    Yes Historical Provider, MD  traZODone (DESYREL) 50 MG tablet Take 50 mg by mouth at bedtime.    Yes Historical Provider, MD  vitamin C (ASCORBIC ACID) 500 MG tablet Take 500 mg by mouth every morning.    Yes Historical Provider, MD  vitamin E 400 UNIT capsule Take 400 Units by mouth every morning.    Yes Historical Provider, MD     Vital Signs: BP 140/68 mmHg  Pulse 85  Temp(Src) 98.3 F (36.8 C) (Oral)  Resp 18  Ht 5\' 8"  (1.727 m)  Wt 191 lb 3.2 oz (86.728 kg)  BMI 29.08 kg/m2  SpO2 96%  Physical Exampt awake/alert; left RP drain intact, insertion site ok, no sig tenderness, output 240 cc's ; cx's pend  Imaging: Ct Abdomen Pelvis Wo Contrast  08/01/2014   CLINICAL DATA:  Week with generalized body aches. Recent left nephrectomy.  EXAM: CT ABDOMEN AND PELVIS WITHOUT CONTRAST  TECHNIQUE: Multidetector CT imaging of the abdomen and pelvis was performed following the standard protocol without IV contrast.  COMPARISON:  05/01/2014  FINDINGS: Nasogastric tube extends into the stomach.  There is a small left pleural effusion and mild left lung base consolidation which may be atelectatic.  There is a 4.4 x 9.4 x 10.7 cm collection in the left nephrectomy bed. This could represent seroma or hematoma. There is no internal gas. Abscess cannot be excluded.  There is a small volume peritoneal fluid collected dependently in the pelvis and in the right pericolic gutter.  There are unremarkable unenhanced appearances of the liver, spleen, pancreas, right adrenal and right kidney. Gallbladder and bile ducts appear unremarkable. Mesentery appears unremarkable. There is mild nonspecific dilatation of proximal small bowel with gradual transition to decompressed distal small bowel.  This is more likely nonobstructive. There is no free intraperitoneal air.  No significant skeletal abnormalities are evident.  IMPRESSION: *4.4 x 9.4 x 10.7 cm collection in the left nephrectomy bed, indeterminate with regard to seroma, hematoma or abscess. No internal gas. *Small volume ascites *Mild dilatation of proximal small bowel, more likely nonobstructive. *Small left pleural effusion and mild left base consolidation which might be atelectatic   Electronically Signed   By: Andreas Newport M.D.   On: 08/01/2014 04:41   Dg Abd Acute W/chest  08/01/2014   CLINICAL DATA:  Week and generalized body pains.  EXAM: DG ABDOMEN ACUTE W/ 1V CHEST  COMPARISON:  07/13/2014  FINDINGS: There is abnormal small bowel dilatation and stacking, suspicious for partial or early obstruction. There is no free intraperitoneal air. There is stool and air throughout nondistended colon. The upright view the chest demonstrates atelectasis or infiltrate in the left base. The right lung is clear. There is a small left pleural effusion.  IMPRESSION: Abnormal bowel gas pattern suspicious for partial or early small bowel obstruction. Left lung base atelectasis or infiltrate. Small left pleural effusion.   Electronically Signed   By: Andreas Newport M.D.   On: 08/01/2014 00:11   Ct Image Guided Drainage Percut Cath  Peritoneal Retroperit  08/01/2014   CLINICAL DATA:  Left renal fossa abscess  EXAM: CT CORE BIOPSY RENAL  FLUOROSCOPY TIME:  None  MEDICATIONS AND MEDICAL HISTORY: Versed 1.5 mg, Fentanyl 100 mcg.  Additional Medications: None.  ANESTHESIA/SEDATION: Moderate sedation time: 11 minutes  CONTRAST:  None  PROCEDURE: The procedure, risks, benefits, and alternatives were explained to the patient. Questions regarding the procedure were encouraged and answered. The patient understands and consents to the procedure.  The back was prepped with Betadine in a sterile fashion, and a sterile drape was applied covering the operative  field. A sterile gown and sterile gloves were used for the procedure.  Under CT guidance, an 18 gauge needle was inserted into the left renal fossa abscess. It was removed over an Amplatz wire. A 12 French dilator followed by a 12 [place] drain were inserted. It was looped and string fixed then sewn to the skin. Dark fluid was aspirated.  FINDINGS: Images document 32 French drain placement into a left renal fossae abscess.  COMPLICATIONS: None  IMPRESSION: Successful left renal fossa abscess drainage.   Electronically Signed   By: Marybelle Killings M.D.   On: 08/01/2014 13:13    Labs:  CBC:  Recent Labs  07/18/14 0348 07/21/14 0502 08/01/14 0013 08/01/14 0023 08/01/14 0654  WBC 8.8 9.7 12.2*  --  10.3  HGB 7.0* 9.1* 11.1* 12.9* 10.3*  HCT 21.2* 27.6* 34.8* 38.0* 32.3*  PLT 267 462* 786*  --  754*    COAGS:  Recent Labs  07/13/14 1800 07/13/14 1939  07/14/14 1055 07/14/14 1830 07/15/14 0058 07/15/14 0515 07/16/14 0700  INR 1.42 1.42  --  1.20  --   --   --  1.21  APTT 40*  --   < > 33 36 31 33  --   < > = values in this interval not displayed.  BMP:  Recent Labs  07/17/14 0655 07/18/14 0348 07/21/14 0502 08/01/14 0023 08/01/14 0654  NA 138 137 136 132* 131*  K 3.3* 3.5 3.3* 4.1 4.1  CL 106 104 105 94* 98*  CO2 29 28 25   --  26  GLUCOSE 95 101* 102* 123* 122*  BUN 33* 30* 21 23* 23*  CALCIUM 7.5* 7.6* 7.9*  --  9.0  CREATININE 1.72* 1.60* 1.70* 1.50* 1.64*  GFRNONAA 39* 42* 39*  --  41*  GFRAA 45* 49* 46*  --  48*    LIVER FUNCTION TESTS:  Recent Labs  07/05/14 1350  BILITOT 0.6  AST 21  ALT 20  ALKPHOS 58  PROT 7.5  ALBUMIN 4.2    Assessment and Plan: S/p left RP/renal fossa fluid collection drainage 5/3; check final cx's, monitor output closely; monitor labs, cont drain irrigation; check f/u CT within 1 week to assess adequacy of drainage  Signed: Rhett Mutschler,D KEVIN 08/02/2014, 9:15 AM   I spent a total of 15 minutes  in face to face in clinical  consultation/evaluation, greater than 50% of which was counseling/coordinating care for left RP fluid collection drainage

## 2014-08-03 ENCOUNTER — Ambulatory Visit
Admission: RE | Admit: 2014-08-03 | Discharge: 2014-08-03 | Disposition: A | Discharge status: Home / Self Care | Payer: Medicare Other | Source: Ambulatory Visit | Attending: Radiation Oncology | Admitting: Radiation Oncology

## 2014-08-03 ENCOUNTER — Ambulatory Visit: Payer: Medicare Other

## 2014-08-03 DIAGNOSIS — C480 Malignant neoplasm of retroperitoneum: Secondary | ICD-10-CM | POA: Insufficient documentation

## 2014-08-03 MED ORDER — ENSURE ENLIVE PO LIQD
237.0000 mL | Freq: Two times a day (BID) | ORAL | Status: DC
Start: 2014-08-03 — End: 2014-08-04
  Administered 2014-08-03: 237 mL via ORAL

## 2014-08-03 NOTE — Progress Notes (Signed)
Nutrition Follow-up  DOCUMENTATION CODES:  Severe malnutrition in context of acute illness/injury  INTERVENTION:  Provide Ensure Enlive po BID, each supplement provides 350 kcal and 20 grams of protein Encourage PO intake RD to continue to monitor  NUTRITION DIAGNOSIS:  Malnutrition related to acute illness as evidenced by energy intake < or equal to 50% for > or equal to 5 days, percent weight loss.  Ongoing.  GOAL:  Patient will meet greater than or equal to 90% of their needs  unmet  MONITOR:  Diet advancement, Labs, Weight trends, Skin, I & O's  REASON FOR ASSESSMENT:  Malnutrition Screening Tool    ASSESSMENT: 69 year old male who underwent resection of a left retroperitoneal mass and left nephrectomy 07/13/2014. 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 He went home 07/23/2014. Yesterday morning, he ate breakfast then began having chills and nausea and vomiting that persisted.   Pt reports feeling the same and appetite not improved. However, according to pt's wife, he has been eating better. Pt reports he does not like the food served in the hospital. Encouraged he drink his Ensure supplements and encouraged pt's wife to bring foods from outside the hospital to encourage better PO intake. Per documentation: PO intake: 100% of breakfast of omelette, OJ and english muffin.  Labs reviewed: Elevated Creatinine  Height:  Ht Readings from Last 1 Encounters:  08/01/14 5\' 8"  (1.727 m)    Weight:  Wt Readings from Last 1 Encounters:  08/01/14 191 lb 3.2 oz (86.728 kg)    Ideal Body Weight:  70 kg  Wt Readings from Last 10 Encounters:  08/01/14 191 lb 3.2 oz (86.728 kg)  07/13/14 211 lb 4 oz (95.822 kg)  05/11/14 217 lb 14.4 oz (98.839 kg)  10/20/13 204 lb (92.534 kg)    BMI:  Body mass index is 29.08 kg/(m^2).  Estimated Nutritional Needs:  Kcal:  1900-2100  Protein:  75-85g  Fluid:  1.9L/day    Skin:  Wound (see comment) (abdominal  incision)  Diet Order:  Diet regular Room service appropriate?: Yes; Fluid consistency:: Thin  EDUCATION NEEDS:  No education needs identified at this time   Intake/Output Summary (Last 24 hours) at 08/03/14 1219 Last data filed at 08/03/14 1012  Gross per 24 hour  Intake   4515 ml  Output   2900 ml  Net   1615 ml    Last BM:  5/2  Clayton Bibles, MS, RD, LDN Pager: [phone] After Hours Pager: [phone]

## 2014-08-03 NOTE — Progress Notes (Signed)
Referring Physician(s): CCS  Subjective:  L RP fluid collection drain placed 5/3 Doing well Draining well   Allergies: Review of patient's allergies indicates no known allergies.  Medications: Prior to Admission medications   Medication Sig Start Date End Date Taking? Authorizing Provider  aspirin EC 81 MG tablet Take 81 mg by mouth at bedtime.   Yes Historical Provider, MD  Coenzyme Q10 (COQ10) 100 MG CAPS Take 100-200 mg by mouth 2 (two) times daily. Take 1 tablet (100 mg) every morning and 2 tablets (200 mg) every night   Yes Historical Provider, MD  fexofenadine (ALLEGRA) 180 MG tablet Take 180 mg by mouth daily as needed for allergies.    Yes Historical Provider, MD  Ginkgo Biloba (GNP GINGKO BILOBA EXTRACT PO) Take 120 mg by mouth 2 (two) times daily.   Yes Historical Provider, MD  ibuprofen (ADVIL,MOTRIN) 200 MG tablet Take 400-600 mg by mouth every 6 (six) hours as needed for headache or moderate pain.   Yes Historical Provider, MD  losartan-hydrochlorothiazide (HYZAAR) 100-25 MG per tablet Take 1 tablet by mouth every morning.    Yes Historical Provider, MD  Multiple Minerals-Vitamins (CALCIUM CITRATE PLUS/MAGNESIUM) TABS Take 1 tablet by mouth every morning. Calcium citrate 500 mg, magnesium 800 mg, zinc 10 mg   Yes Historical Provider, MD  Multiple Vitamin (MULTIVITAMIN WITH MINERALS) TABS tablet Take 1 tablet by mouth every morning.    Yes Historical Provider, MD  Omega-3 Fatty Acids (FISH OIL) 1200 MG CAPS Take 1,200 mg by mouth 2 (two) times daily.    Yes Historical Provider, MD  OVER THE COUNTER MEDICATION Take 1 tablet by mouth at bedtime. Beta Sitosterol 375 mg   Yes Historical Provider, MD  OVER THE COUNTER MEDICATION Take 400-800 mg by mouth 2 (two) times daily. Bio Astin: 2 tablets (800 mg) every morning and 1 tablet (400 mg) every night   Yes Historical Provider, MD  oxyCODONE-acetaminophen (PERCOCET/ROXICET) 5-325 MG per tablet Take 1 tablet by mouth every 4  (four) hours as needed for severe pain.   Yes Historical Provider, MD  simvastatin (ZOCOR) 40 MG tablet Take 40 mg by mouth at bedtime.    Yes Historical Provider, MD  traZODone (DESYREL) 50 MG tablet Take 50 mg by mouth at bedtime.    Yes Historical Provider, MD  vitamin C (ASCORBIC ACID) 500 MG tablet Take 500 mg by mouth every morning.    Yes Historical Provider, MD  vitamin E 400 UNIT capsule Take 400 Units by mouth every morning.    Yes Historical Provider, MD     Vital Signs: BP 159/69 mmHg  Pulse 105  Temp(Src) 98.2 F (36.8 C) (Oral)  Resp 18  Ht 5\' 8"  (1.727 m)  Wt 86.728 kg (191 lb 3.2 oz)  BMI 29.08 kg/m2  SpO2 98%  Physical Exam  Skin: Skin is warm and dry.  Site of drain is NT; no bleeding No sign of infection Clean and dry Output 90 cc yesterday 10 cc recorded today Output milky/bloody in JP cx NG  afeb Wbc wnl  Nursing note and vitals reviewed.   Imaging: Ct Abdomen Pelvis Wo Contrast  08/01/2014   CLINICAL DATA:  Week with generalized body aches. Recent left nephrectomy.  EXAM: CT ABDOMEN AND PELVIS WITHOUT CONTRAST  TECHNIQUE: Multidetector CT imaging of the abdomen and pelvis was performed following the standard protocol without IV contrast.  COMPARISON:  05/01/2014  FINDINGS: Nasogastric tube extends into the stomach.  There is a small  left pleural effusion and mild left lung base consolidation which may be atelectatic.  There is a 4.4 x 9.4 x 10.7 cm collection in the left nephrectomy bed. This could represent seroma or hematoma. There is no internal gas. Abscess cannot be excluded.  There is a small volume peritoneal fluid collected dependently in the pelvis and in the right pericolic gutter.  There are unremarkable unenhanced appearances of the liver, spleen, pancreas, right adrenal and right kidney. Gallbladder and bile ducts appear unremarkable. Mesentery appears unremarkable. There is mild nonspecific dilatation of proximal small bowel with gradual  transition to decompressed distal small bowel. This is more likely nonobstructive. There is no free intraperitoneal air.  No significant skeletal abnormalities are evident.  IMPRESSION: *4.4 x 9.4 x 10.7 cm collection in the left nephrectomy bed, indeterminate with regard to seroma, hematoma or abscess. No internal gas. *Small volume ascites *Mild dilatation of proximal small bowel, more likely nonobstructive. *Small left pleural effusion and mild left base consolidation which might be atelectatic   Electronically Signed   By: Andreas Newport M.D.   On: 08/01/2014 04:41   Dg Abd Acute W/chest  08/01/2014   CLINICAL DATA:  Week and generalized body pains.  EXAM: DG ABDOMEN ACUTE W/ 1V CHEST  COMPARISON:  07/13/2014  FINDINGS: There is abnormal small bowel dilatation and stacking, suspicious for partial or early obstruction. There is no free intraperitoneal air. There is stool and air throughout nondistended colon. The upright view the chest demonstrates atelectasis or infiltrate in the left base. The right lung is clear. There is a small left pleural effusion.  IMPRESSION: Abnormal bowel gas pattern suspicious for partial or early small bowel obstruction. Left lung base atelectasis or infiltrate. Small left pleural effusion.   Electronically Signed   By: Andreas Newport M.D.   On: 08/01/2014 00:11   Ct Image Guided Drainage Percut Cath  Peritoneal Retroperit  08/01/2014   CLINICAL DATA:  Left renal fossa abscess  EXAM: CT CORE BIOPSY RENAL  FLUOROSCOPY TIME:  None  MEDICATIONS AND MEDICAL HISTORY: Versed 1.5 mg, Fentanyl 100 mcg.  Additional Medications: None.  ANESTHESIA/SEDATION: Moderate sedation time: 11 minutes  CONTRAST:  None  PROCEDURE: The procedure, risks, benefits, and alternatives were explained to the patient. Questions regarding the procedure were encouraged and answered. The patient understands and consents to the procedure.  The back was prepped with Betadine in a sterile fashion, and a  sterile drape was applied covering the operative field. A sterile gown and sterile gloves were used for the procedure.  Under CT guidance, an 18 gauge needle was inserted into the left renal fossa abscess. It was removed over an Amplatz wire. A 12 French dilator followed by a 12 [place] drain were inserted. It was looped and string fixed then sewn to the skin. Dark fluid was aspirated.  FINDINGS: Images document 28 French drain placement into a left renal fossae abscess.  COMPLICATIONS: None  IMPRESSION: Successful left renal fossa abscess drainage.   Electronically Signed   By: Marybelle Killings M.D.   On: 08/01/2014 13:13    Labs:  CBC:  Recent Labs  07/21/14 0502 08/01/14 0013 08/01/14 0023 08/01/14 0654 08/02/14 1100  WBC 9.7 12.2*  --  10.3 6.5  HGB 9.1* 11.1* 12.9* 10.3* 10.1*  HCT 27.6* 34.8* 38.0* 32.3* 32.3*  PLT 462* 786*  --  754* 693*    COAGS:  Recent Labs  07/13/14 1800 07/13/14 1939  07/14/14 1055 07/14/14 1830 07/15/14 0058 07/15/14 0515  07/16/14 0700  INR 1.42 1.42  --  1.20  --   --   --  1.21  APTT 40*  --   < > 33 36 31 33  --   < > = values in this interval not displayed.  BMP:  Recent Labs  07/18/14 0348 07/21/14 0502 08/01/14 0023 08/01/14 0654 08/02/14 1100  NA 137 136 132* 131* 138  K 3.5 3.3* 4.1 4.1 4.3  CL 104 105 94* 98* 101  CO2 28 25  --  26 28  GLUCOSE 101* 102* 123* 122* 123*  BUN 30* 21 23* 23* 18  CALCIUM 7.6* 7.9*  --  9.0 9.3  CREATININE 1.60* 1.70* 1.50* 1.64* 1.54*  GFRNONAA 42* 39*  --  41* 44*  GFRAA 49* 46*  --  48* 51*    LIVER FUNCTION TESTS:  Recent Labs  07/05/14 1350  BILITOT 0.6  AST 21  ALT 20  ALKPHOS 58  PROT 7.5  ALBUMIN 4.2    Assessment and Plan:  L RP abscess drain intact Plan per CCS If dc; he can be seen in IR drain clinic next week We will call pt with time and date Let us know  Signed: Macario Shear A 08/03/2014, 2:40 PM   I spent a total of 15 Minutes in face to face in clinical  consultation/evaluation, greater than 50% of which was counseling/coordinating care for abscess drain

## 2014-08-03 NOTE — Discharge Summary (Signed)
Physician Discharge Summary  Patient ID: Darryl Griffith MRN: [id number] DOB/AGE: Feb 24, 1946 69 y.o.  Admit date: 07/31/2014 Discharge date: 08/04/2014  Admission Diagnoses:  Patient Active Problem List   Diagnosis Date Noted  . Protein-calorie malnutrition, severe 08/02/2014  . SBO (small bowel obstruction) 08/01/2014  . Retroperitoneal fluid collection   . Postoperative anemia due to acute blood loss 07/15/2014  . ARF (acute renal failure) 07/15/2014  . Thrombocytopenia 07/15/2014  . Hypertension   . 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 Benign neoplasm of colon 10/20/2013   Discharge Diagnoses:  Principal Problem:   SBO (small bowel obstruction) Active Problems:   Retroperitoneal fluid collection   Protein-calorie malnutrition, severe   Discharged Condition: stable  Hospital Course: Pt was admitted to the floor s/p resection of a left retroperitoneal mass and left nephrectomy 07/13/2014 from the ED for a suspected small bowel obstruction based on history and abdominal x-ray. NG tube was placed. Abdominal CT revealed fluid collection in the left nephrectomy bed. Perc drain placed to drain collection, dark fluid was aspirated. Culture of fluid was sent. NG tube removed the next day and was able to advance diet to a regular diet, though has little appetite. Pt continuing to pass gas and have BM.  Fluid culture resulted on day of discharge, revealing no WBCs, no squamous epithelial cells, and no organisms growing in 3 days.  Drain left in at time of discharge with plans to follow up with IR in 1-2 weeks.   Consults: Interventional Radiology  Significant Diagnostic Studies: Radiology: X-Ray: small bowel dilatation and abnormal bowel gas pattern. Small left pleural effusion. and CT scan: see above  Treatments: IV hydration, and percutaneous drain to left nephrectomy bed.  Discharge Exam: Blood pressure  151/75, pulse 82, temperature 97.9 F (36.6 C), temperature source Oral, resp. rate 18, height 5\' 8"  (1.727 m), weight 191 lb 3.2 oz (86.728 kg), SpO2 98 %. General appearance: alert, cooperative and no distress Resp: breathing comfortably, non-labored Cardio: regular rate and rhythm GI: soft, non-distended, appropriately tender. Drain with dark fluid.  Disposition: 06-Home-Health Care Svc      Discharge Instructions    Call MD for:  persistant nausea and vomiting    Complete by:  As directed      Call MD for:  redness, tenderness, or signs of infection (pain, swelling, redness, odor or green/yellow discharge around incision site)    Complete by:  As directed      Call MD for:  severe uncontrolled pain    Complete by:  As directed      Call MD for:  temperature >100.4    Complete by:  As directed      Diet - low sodium heart healthy    Complete by:  As directed      Discharge instructions    Complete by:  As directed   Drain care per IR.     Discharge patient    Complete by:  As directed   To home after breakfast.     Increase activity slowly    Complete by:  As directed             Medication List    TAKE these medications        aspirin EC 81 MG tablet  Take 81 mg by mouth at bedtime.     CALCIUM CITRATE PLUS/MAGNESIUM Tabs  Take 1 tablet by mouth every morning. Calcium  citrate 500 mg, magnesium 800 mg, zinc 10 mg     CoQ10 100 MG Caps  Take 100-200 mg by mouth 2 (two) times daily. Take 1 tablet (100 mg) every morning and 2 tablets (200 mg) every night     fexofenadine 180 MG tablet  Commonly known as:  ALLEGRA  Take 180 mg by mouth daily as needed for allergies.     Fish Oil 1200 MG Caps  Take 1,200 mg by mouth 2 (two) times daily.     GNP GINGKO BILOBA EXTRACT PO  Take 120 mg by mouth 2 (two) times daily.     ibuprofen 200 MG tablet  Commonly known as:  ADVIL,MOTRIN  Take 400-600 mg by mouth every 6 (six) hours as needed for headache or moderate pain.      losartan-hydrochlorothiazide 100-25 MG per tablet  Commonly known as:  HYZAAR  Take 1 tablet by mouth every morning.     multivitamin with minerals Tabs tablet  Take 1 tablet by mouth every morning.     OVER THE COUNTER MEDICATION  Take 1 tablet by mouth at bedtime. Beta Sitosterol 375 mg     OVER THE COUNTER MEDICATION  Take 400-800 mg by mouth 2 (two) times daily. Bio Astin: 2 tablets (800 mg) every morning and 1 tablet (400 mg) every night     oxyCODONE-acetaminophen 5-325 MG per tablet  Commonly known as:  PERCOCET/ROXICET  Take 1-2 tablets by mouth every 6 (six) hours as needed for severe pain.     simvastatin 40 MG tablet  Commonly known as:  ZOCOR  Take 40 mg by mouth at bedtime.     traZODone 50 MG tablet  Commonly known as:  DESYREL  Take 50 mg by mouth at bedtime.     vitamin C 500 MG tablet  Commonly known as:  ASCORBIC ACID  Take 500 mg by mouth every morning.     vitamin E 400 UNIT capsule  Take 400 Units by mouth every morning.       Follow-up Information    Follow up with [facility], MD In 2 weeks.   Specialty:  General Surgery   Contact information:   [address] [place]  [address] [phone]       Follow up with Interventional radiology drain clinic . Schedule an appointment as soon as possible for a visit in 1 week.      Signed: Woodfin Ganja, PA-S [facility] [phone] 08/04/2014, 8:02 AM

## 2014-08-03 NOTE — Consult Note (Signed)
Radiation Oncology         [phone] ________________________________  Initial inpatient Consultation  Name: Darryl Griffith MRN: [id number]  Date: 07/31/2014  DOB: 1945-05-25  GX:QJJHER,DEYCX LORNE, MD  No ref. provider found   REFERRING PHYSICIAN: No ref. provider found  DIAGNOSIS: The primary encounter diagnosis was Small bowel obstruction. Diagnoses of Vomiting, Pain, and Fluid collection at surgical site were also pertinent to this visit.    ICD-9-CM ICD-10-CM   1. Small bowel obstruction 560.9 K56.69   2. Vomiting 787.03 R11.10 DG Abd Acute W/Chest     DG Abd Acute W/Chest  3. Pain 780.96 R52 CT Abdomen Pelvis Wo Contrast     CT Abdomen Pelvis Wo Contrast     CANCELED: CT Abdomen Pelvis W Contrast     CANCELED: CT Abdomen Pelvis W Contrast  4. Fluid collection at surgical site 998.13 T88.8XXA CT IMAGE GUIDED DRAINAGE PERCUT CATH  PERITONEAL RETROPERIT     CT IMAGE GUIDED DRAINAGE PERCUT CATH  PERITONEAL RETROPERIT    HISTORY OF PRESENT ILLNESS::Darryl Griffith is a 69 y.o. male who presented with rib pain and no trauma, prompting chest CT on 04/19/14.  This study showed bulky retroperitoneal adenopathy in the upper abdomen.  CT Abdomen/Pelvis on 05/01/14 confirmed an upper retroperitoneal mass.  The distribution of the mass was suggestive of adenopathy from a testicular primary, and his left testis was abnormal on CT.  So, scrotal U/S was done on 05/03/14.  No testicular masses were seen, but, venous congestion was noted and attributed to the retroperitoneal mass.  PET/CT was done on 05/19/14 showing 2 dominant masses measuring 6.1 x 5.5 and 4.2 x 3.9. The larger more ventral mass has SUV max 10.6 while the smaller more dorsal mass has SUV max 5.8. There are several additional smaller nodes/masses in a 4 cm conglomerate.      CT biopsy was done on 05/31/14 showing high grade spindle cell neoplasm.  On 07/13/14, Dr. Barry Dienes performed resection of large malignant retroperitoneal tumor en bloc  with left nephrectomy, mobilization of splenic flexure.    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 - KIDNEY AND ADRENAL GLAND: UNREMARKABLE. Microscopic Comment Sections show two distinct patterns of well differentiated liposarcoma and high grade dedifferentiated component. The neoplastic cells are close to and focally involving the inked margins at multiple foci. Tumor necrosis and mitotic figures are also present. This case is sent to [facility] for expert consultation. The original consult letter is available in the EPIC. Case was discussed with Dr. Barry Dienes on 4-18,19-2016. SARCOMA AND SOFT TISSUE Histologic type: Dedifferentiated liposarcoma Mitotic rate (# per 10 hpf): >10 per 10/HPF. Necrosis: Present, large areas Grade: High grade, grade III Margins: Close and focally positive at multiple foci. Lymph - Vascular invasion: Clusters of round cells within a thin-walled vascular stroma  Two lymph nodes were identified.  One was involved by direct extension, and the other was negative.  The patient was staged as a pT2 N0, and M0 by imaging.  He was scheduled to see me in consultation as an outpatient today, but, required hospitalization for left retroperitoneal abscess.  A drain was placed by IR on 08/01/14.  He may be discharged to home later today.  PREVIOUS RADIATION THERAPY: No  PAST MEDICAL HISTORY:  has a past medical history of Hypertension; Elevated cholesterol;  Anginal pain; Left Retroperitoneal mass s/p resection/L nephrectomy 07/13/2014; and Dedifferentiated liposarcoma.    PAST SURGICAL HISTORY: Past Surgical History  Procedure Laterality Date  . Surgery for arm fracture Right 1952  . Knee arthroscopy Right 1970's  . Colonoscopy with propofol N/A 10/20/2013     Procedure: COLONOSCOPY WITH PROPOFOL;  Surgeon: Lear Ng, MD;  Location: [facility] ENDOSCOPY;  Service: Endoscopy;  Laterality: N/A;  . Hot hemostasis N/A 10/20/2013    Procedure: HOT HEMOSTASIS (ARGON PLASMA COAGULATION/BICAP);  Surgeon: Lear Ng, MD;  Location: [facility];  Service: Endoscopy;  Laterality: N/A;  . Tigger finger release      per H&P per Dr Wynonia Lawman 09/20/2013 (on chart)  . Resection of retroperitoneal mass Left 07/13/2014    Procedure: RESECTION OF left RETROPERITONEAL MASS;  Surgeon: Stark Klein, MD;  Location: [facility] ORS;  Service: General;  Laterality: Left;    FAMILY HISTORY: He was adopted. Family history is unknown by patient.  SOCIAL HISTORY:  reports that he has never smoked. He has never used smokeless tobacco. He reports that he drinks alcohol. He reports that he does not use illicit drugs.  ALLERGIES: Review of patient's allergies indicates no known allergies.  MEDICATIONS:  Current Facility-Administered Medications  Medication Dose Route Frequency Provider Last Rate Last Dose  . aspirin tablet 325 mg  325 mg Oral Daily Stark Klein, MD   325 mg at 08/02/14 1042  . dextrose 5 %-0.9 % sodium chloride infusion   Intravenous Continuous Jackolyn Confer, MD 125 mL/hr at 08/03/14 0206 125 mL/hr at 08/03/14 0206  . enoxaparin (LOVENOX) injection 40 mg  40 mg Subcutaneous Q24H Stark Klein, MD   40 mg at 08/02/14 1720  . morphine 2 MG/ML injection 2-4 mg  2-4 mg Intravenous Q2H PRN Jackolyn Confer, MD   4 mg at 08/02/14 0224  . ondansetron (ZOFRAN) injection 4 mg  4 mg Intravenous Q4H PRN Jackolyn Confer, MD      . pantoprazole (PROTONIX) injection 40 mg  40 mg Intravenous QHS Jackolyn Confer, MD   40 mg at 08/02/14 2128  . phenol (CHLORASEPTIC) mouth spray 1 spray  1 spray Mouth/Throat PRN Jackolyn Confer, MD   1 spray at 08/01/14 0455  . traZODone (DESYREL) tablet 50 mg  50 mg Oral QHS Excell Seltzer, MD   50 mg at 08/03/14 0024    REVIEW OF SYSTEMS:   A 15 point review of systems is documented in the electronic medical record. This was obtained by the nursing staff. However, I reviewed this with the patient to discuss relevant findings and make appropriate changes.  Pertinent items are noted in HPI.   PHYSICAL EXAM:  height is 5\' 8"  (1.727 m) and weight is 191 lb 3.2 oz (86.728 kg). His oral temperature is 98.6 F (37 C). His blood pressure is 144/63 and his pulse is 87. His respiration is 18 and oxygen saturation is 97%.   According to Dr. Zella Richer at admission, the following physical findings were noted.:  Blood pressure 165/81, pulse 93, temperature 97.9 F (36.6 C), temperature source Oral, resp. rate 16, height 5\' 8"  (1.727 m), weight 85.73 kg (189 lb), SpO2 95 %. Physical Exam  Constitutional: He appears well-developed and well-nourished. No distress.  HENT:  Head: Normocephalic and atraumatic.  Ng tube in draining bilious fluid.  Cardiovascular: Normal rate and regular rhythm.  Respiratory: Effort normal.  GI: Soft. He exhibits distension. There is no tenderness.  Midline incision is clean and intact with Steri-Strips in place.  He has intermittent high-pitched rushing bowel sounds.  Musculoskeletal: He exhibits no edema.  Neurological: He is alert.  Skin: Skin is warm and dry.   Presently, he is in no acute distress with an abdominal drain in place.  KPS = 70 (post-op pending full recovery)  100 - Normal; no complaints; no evidence of disease. 90   - Able to carry on normal activity; minor signs or symptoms of disease. 80   - Normal activity with effort; some signs or symptoms of disease. 60   - Cares for self; unable to carry on normal activity or to do active work. 60   - Requires occasional assistance, but is able to care for most of his personal needs. 50   - Requires considerable assistance and frequent medical care. 71   - Disabled; requires special care and assistance. 67   - Severely disabled; hospital admission  is indicated although death not imminent. 32   - Very sick; hospital admission necessary; active supportive treatment necessary. 10   - Moribund; fatal processes progressing rapidly. 0     - Dead  Karnofsky DA, Abelmann [place], Craver LS and Burchenal [facility] [phone]) The use of the nitrogen mustards in the palliative treatment of carcinoma: with particular reference to bronchogenic carcinoma Cancer 1 634-56  LABORATORY DATA:  Lab Results  Component Value Date   WBC 6.5 08/02/2014   HGB 10.1* 08/02/2014   HCT 32.3* 08/02/2014   MCV 90.5 08/02/2014   PLT 693* 08/02/2014   Lab Results  Component Value Date   NA 138 08/02/2014   K 4.3 08/02/2014   CL 101 08/02/2014   CO2 28 08/02/2014   Lab Results  Component Value Date   ALT 20 07/05/2014   AST 21 07/05/2014   ALKPHOS 58 07/05/2014   BILITOT 0.6 07/05/2014     RADIOGRAPHY: Ct Abdomen Pelvis Wo Contrast  08/01/2014   CLINICAL DATA:  Week with generalized body aches. Recent left nephrectomy.  EXAM: CT ABDOMEN AND PELVIS WITHOUT CONTRAST  TECHNIQUE: Multidetector CT imaging of the abdomen and pelvis was performed following the standard protocol without IV contrast.  COMPARISON:  05/01/2014  FINDINGS: Nasogastric tube extends into the stomach.  There is a small left pleural effusion and mild left lung base consolidation which may be atelectatic.  There is a 4.4 x 9.4 x 10.7 cm collection in the left nephrectomy bed. This could represent seroma or hematoma. There is no internal gas. Abscess cannot be excluded.  There is a small volume peritoneal fluid collected dependently in the pelvis and in the right pericolic gutter.  There are unremarkable unenhanced appearances of the liver, spleen, pancreas, right adrenal and right kidney. Gallbladder and bile ducts appear unremarkable. Mesentery appears unremarkable. There is mild nonspecific dilatation of proximal small bowel with gradual transition to decompressed distal small bowel. This is more likely  nonobstructive. There is no free intraperitoneal air.  No significant skeletal abnormalities are evident.  IMPRESSION: *4.4 x 9.4 x 10.7 cm collection in the left nephrectomy bed, indeterminate with regard to seroma, hematoma or abscess. No internal gas. *Small volume ascites *Mild dilatation of proximal small bowel, more likely nonobstructive. *Small left pleural effusion and mild left base consolidation which might be atelectatic   Electronically Signed   By: Andreas Newport M.D.   On: 08/01/2014 04:41   Dg Chest Port 1 View  07/13/2014   CLINICAL DATA:  Central line placement.  EXAM: PORTABLE CHEST - 1 VIEW  COMPARISON:  02/03/2014  FINDINGS: Right internal jugular central venous line tip projects in the lower superior vena cava just above the caval atrial junction. There is no pneumothorax.  Endotracheal tube tip projects 2.8 cm above the chronic, well positioned. Nasogastric tube is also well-positioned passing well below the diaphragm into the stomach.  There is central interstitial thickening, but no focal lung consolidation. No pleural effusion or pneumothorax. Cardiac silhouette is normal in size. No gross mediastinal or hilar masses.  IMPRESSION: 1. Central venous line and endotracheal tube and nasogastric tube are well positioned as described. No pneumothorax. 2. Central interstitial thickening. This is exaggerated by low lung volumes. This could reflect diffuse interstitial infiltrate or interstitial edema.   Electronically Signed   By: Lajean Manes M.D.   On: 07/13/2014 16:24   Dg Abd Acute W/chest  08/01/2014   CLINICAL DATA:  Week and generalized body pains.  EXAM: DG ABDOMEN ACUTE W/ 1V CHEST  COMPARISON:  07/13/2014  FINDINGS: There is abnormal small bowel dilatation and stacking, suspicious for partial or early obstruction. There is no free intraperitoneal air. There is stool and air throughout nondistended colon. The upright view the chest demonstrates atelectasis or infiltrate in the left  base. The right lung is clear. There is a small left pleural effusion.  IMPRESSION: Abnormal bowel gas pattern suspicious for partial or early small bowel obstruction. Left lung base atelectasis or infiltrate. Small left pleural effusion.   Electronically Signed   By: Andreas Newport M.D.   On: 08/01/2014 00:11   Ct Image Guided Drainage Percut Cath  Peritoneal Retroperit  08/01/2014   CLINICAL DATA:  Left renal fossa abscess  EXAM: CT CORE BIOPSY RENAL  FLUOROSCOPY TIME:  None  MEDICATIONS AND MEDICAL HISTORY: Versed 1.5 mg, Fentanyl 100 mcg.  Additional Medications: None.  ANESTHESIA/SEDATION: Moderate sedation time: 11 minutes  CONTRAST:  None  PROCEDURE: The procedure, risks, benefits, and alternatives were explained to the patient. Questions regarding the procedure were encouraged and answered. The patient understands and consents to the procedure.  The back was prepped with Betadine in a sterile fashion, and a sterile drape was applied covering the operative field. A sterile gown and sterile gloves were used for the procedure.  Under CT guidance, an 18 gauge needle was inserted into the left renal fossa abscess. It was removed over an Amplatz wire. A 12 French dilator followed by a 12 [place] drain were inserted. It was looped and string fixed then sewn to the skin. Dark fluid was aspirated.  FINDINGS: Images document 34 French drain placement into a left renal fossae abscess.  COMPLICATIONS: None  IMPRESSION: Successful left renal fossa abscess drainage.   Electronically Signed   By: Marybelle Killings M.D.   On: 08/01/2014 13:13      IMPRESSION: This is a very nice 69 yo man s/p resection of a 12 cm grade III liposarcoma of the retroperitoneum with microscopically positive margins.  He would benefit from localized IMRT to the resection site using a boost of up to 66-68 Gy to reduce his risk of recurrence.  PLAN:Today, I talked to the patient and family about the findings and work-up thus far.  We discussed  the natural history of retroperitoneal sarcoma and general treatment, highlighting the role of radiotherapy in the management.  We discussed the available radiation techniques, and focused on the details of logistics and delivery.  We reviewed the anticipated acute and late sequelae associated with radiation in this setting.  The patient was encouraged to ask  questions that I answered to the best of my ability.  The patient would like to proceed with radiation and will be scheduled for CT simulation.   I spent 30 minutes minutes face to face with the patient and more than 50% of that time was spent in counseling and/or coordination of care.  Following discharge, I would like to spend 30 minutes in the clinic discussing the details of radiation with the patient and his wife.  They have plans to relocate to [place] at the end of June.  If we can initiate IMRT by 5/16, we may be able to complete 33 fractions on 09/28/14, accounting for [facility] Day.   ------------------------------------------------  [name], M.D.

## 2014-08-03 NOTE — Progress Notes (Signed)
  Subjective: Was able to advance diet, but still feeling a bit puny.    Objective: Vital signs in last 24 hours: Temp:  [98.2 F (36.8 C)-99.8 F (37.7 C)] 98.6 F (37 C) (05/05 0610) Pulse Rate:  [83-100] 87 (05/05 0610) Resp:  [18] 18 (05/05 0610) BP: (135-144)/(63-83) 144/63 mmHg (05/05 0610) SpO2:  [97 %-99 %] 97 % (05/05 0610) Last BM Date: 07/31/14  Intake/Output from previous day: 05/04 0701 - 05/05 0700 In: 4775 [P.O.:1800; I.V.:2975] Out: 2990 [Urine:2900; Drains:90] Intake/Output this shift:    General appearance: alert, cooperative and no distress Resp: breathing comfortably GI: soft, sl distended, drain looks like old blood.  Lab Results:   Recent Labs  08/01/14 0654 08/02/14 1100  WBC 10.3 6.5  HGB 10.3* 10.1*  HCT 32.3* 32.3*  PLT 754* 693*   BMET  Recent Labs  08/01/14 0654 08/02/14 1100  NA 131* 138  K 4.1 4.3  CL 98* 101  CO2 26 28  GLUCOSE 122* 123*  BUN 23* 18  CREATININE 1.64* 1.54*  CALCIUM 9.0 9.3   PT/INR No results for input(s): LABPROT, INR in the last 72 hours. ABG No results for input(s): PHART, HCO3 in the last 72 hours.  Invalid input(s): PCO2, PO2  Studies/Results: Ct Image Guided Drainage Percut Cath  Peritoneal Retroperit  08/01/2014   CLINICAL DATA:  Left renal fossa abscess  EXAM: CT CORE BIOPSY RENAL  FLUOROSCOPY TIME:  None  MEDICATIONS AND MEDICAL HISTORY: Versed 1.5 mg, Fentanyl 100 mcg.  Additional Medications: None.  ANESTHESIA/SEDATION: Moderate sedation time: 11 minutes  CONTRAST:  None  PROCEDURE: The procedure, risks, benefits, and alternatives were explained to the patient. Questions regarding the procedure were encouraged and answered. The patient understands and consents to the procedure.  The back was prepped with Betadine in a sterile fashion, and a sterile drape was applied covering the operative field. A sterile gown and sterile gloves were used for the procedure.  Under CT guidance, an 18 gauge needle  was inserted into the left renal fossa abscess. It was removed over an Amplatz wire. A 12 French dilator followed by a 12 [place] drain were inserted. It was looped and string fixed then sewn to the skin. Dark fluid was aspirated.  FINDINGS: Images document 21 French drain placement into a left renal fossae abscess.  COMPLICATIONS: None  IMPRESSION: Successful left renal fossa abscess drainage.   Electronically Signed   By: Marybelle Killings M.D.   On: 08/01/2014 13:13    Anti-infectives: Anti-infectives    None      Assessment/Plan: s/p * No surgery found * Diet as tolerated.   Await culture Ambulate. Home tomorrow.    LOS: 2 days

## 2014-08-04 LAB — CULTURE, ROUTINE-ABSCESS
Culture: NO GROWTH
GRAM STAIN: NONE SEEN

## 2014-08-04 MED ORDER — OXYCODONE-ACETAMINOPHEN 5-325 MG PO TABS: 1.0000 | ORAL_TABLET | Freq: Four times a day (QID) | ORAL | Status: DC | PRN

## 2014-08-04 NOTE — Progress Notes (Signed)
Nurse reviewed discharge instructions with pt and wife.  Pt verbalized understanding of discharge instructions, follow up appointment and new medication.  Nurse showed wife how to flush JP drain, wife verbalized understanding and feels comfortable with flushing the drain and recording output.  No concerns at time of discharge. Prescription given to pt prior to discharge.  IR drain clinic will call pt with appt for follow up on drain.

## 2014-08-04 NOTE — Discharge Instructions (Signed)
[facility] Stay Proper nutrition can help your body recover from illness and injury.   Foods and beverages high in protein, vitamins, and minerals help rebuild muscle loss, promote healing, & reduce fall risk.   In addition to eating healthy foods, a nutrition shake is an easy, delicious way to get the nutrition you need during and after your hospital stay  It is recommended that you continue to drink 2 bottles per day of:       Ensure Enlive for at least 1 month (30 days) after your hospital stay   Tips for adding a nutrition shake into your routine: As allowed, drink one with vitamins or medications instead of water or juice Enjoy one as a tasty mid-morning or afternoon snack Drink cold or make a milkshake out of it Drink one instead of milk with cereal or snacks Use as a coffee creamer   Available at the following grocery stores and pharmacies:           * [employer] [phone]            For COUPONS visit: www.ensure.com/join or http://dawson-may.com/   Suggested Substitutions Ensure Plus = Boost Plus = Carnation Breakfast Essentials = Boost Compact Ensure Active Clear = Boost Breeze Glucerna Shake = Boost Glucose Control = Carnation Breakfast Essentials SUGAR FREE    Measure and record drain output.  Flush drain per protocol.

## 2014-08-04 NOTE — Care Management Note (Signed)
Case Management Note  Patient Details  Name: Darryl Griffith MRN: [id number] Date of Birth: 1945/08/27  Subjective/Objective:         Admitted with bowel obstruction            Action/Plan: Discharge planning  Expected Discharge Date:  08/04/14               Expected Discharge Plan:  Home/Self Care  In-House Referral:  NA  Discharge planning Services  CM Consult  Post Acute Care Choice:  NA Choice offered to:  NA  DME Arranged:  N/A DME Agency:  NA  HH Arranged:  NA HH Agency:  NA  Status of Service:  Completed, signed off  Medicare Important Message Given:  Yes Date Medicare IM Given:  08/04/14 Medicare IM give by:  Sunday Spillers RN CM  Date Additional Medicare IM Given:    Additional Medicare Important Message give by:     If discussed at [place] of Stay Meetings, dates discussed:    Additional Comments: Per nursing, patient to be instructed on care of drain. Patient does not want [place] services.   Guadalupe Maple, RN 08/04/2014, 11:17 AM

## 2014-08-07 ENCOUNTER — Ambulatory Visit
Admission: RE | Admit: 2014-08-07 | Discharge: 2014-08-07 | Disposition: A | Discharge status: Home / Self Care | Payer: Medicare Other | Source: Ambulatory Visit | Attending: Radiation Oncology | Admitting: Radiation Oncology

## 2014-08-07 ENCOUNTER — Encounter: Payer: Self-pay | Admitting: Radiation Oncology

## 2014-08-07 ENCOUNTER — Ambulatory Visit: Payer: Medicare Other

## 2014-08-07 ENCOUNTER — Other Ambulatory Visit (HOSPITAL_COMMUNITY): Payer: Self-pay | Admitting: Interventional Radiology

## 2014-08-07 ENCOUNTER — Other Ambulatory Visit (HOSPITAL_COMMUNITY): Payer: Self-pay | Admitting: General Surgery

## 2014-08-07 VITALS — BP 99/61 | HR 112 | Resp 16 | Ht 68.0 in | Wt 188.2 lb

## 2014-08-07 DIAGNOSIS — C499 Malignant neoplasm of connective and soft tissue, unspecified: Secondary | ICD-10-CM

## 2014-08-07 DIAGNOSIS — C480 Malignant neoplasm of retroperitoneum: Secondary | ICD-10-CM

## 2014-08-07 DIAGNOSIS — L0291 Cutaneous abscess, unspecified: Secondary | ICD-10-CM

## 2014-08-07 NOTE — Progress Notes (Signed)
JP drain noted left flank. Wife reports on an average day that she obtains 30 cc from this drain. Patient denies pain. Patient reports discomfort and vertical abdominal incision. Patient reports despite numerous attempts he has been unable to secure a follow up with Dr. Barry Dienes. BP low this morning. Advised patient to check BP with home monitor prior to taking BP medication tomorrow morning. 25 lb weight loss reports since 4/15. Denies that he has a follow up appointment set with Dr. Alen Blew. Denies constipation or diarrhea. Reports nausea and vomiting has resolved however, patient reports abdominal discomfort after most meals. Reports decreased appetite. Reports supplementing with one can of Ensure per day.

## 2014-08-07 NOTE — Progress Notes (Signed)
See progress note under physician encounter. 

## 2014-08-07 NOTE — Progress Notes (Signed)
  Radiation Oncology         [phone] ________________________________  Name: Darryl Griffith MRN: [id number]  Date: 08/07/2014  DOB: [birth year]  SIMULATION AND TREATMENT PLANNING NOTE    ICD-9-CM ICD-10-CM   1. Left Retroperitoneal 12 cm grade III Liposarcoma s/p Resection and left nephrectomy 07/13/2014 with positive margins 158.0 C48.0     DIAGNOSIS:  69 yo man s/p resection of a 12 cm grade III liposarcoma of the retroperitoneum with microscopically positive margins.   NARRATIVE:  The patient was brought to the [facility].  Identity was confirmed.  All relevant records and images related to the planned course of therapy were reviewed.  The patient freely provided informed written consent to proceed with treatment after reviewing the details related to the planned course of therapy. The consent form was witnessed and verified by the simulation staff.  Then, the patient was set-up in a stable reproducible  supine position for radiation therapy.  CT images were obtained.  Surface markings were placed.  The CT images were loaded into the planning software.  Then the target and avoidance structures were contoured.  Treatment planning then occurred.  The radiation prescription was entered and confirmed.  Then, I designed and supervised the construction of a total of 1 medically necessary complex treatment device, in the form of a bodyfix custom immobilization pillow.  I have requested : 3D Simulation  I have requested a DVH of the following structures: left kidney, right kidney, stomach, small bowel, liver, spinal cord and target volume.  I have ordered:Nutrition Consult.   PLAN:  The patient will receive 66 Gy in 33 fraction.  ________________________________  Sheral Apley Tammi Klippel, M.D.  This document serves as a record of services personally performed by Tyler Pita, MD. It was created on his behalf by Derek Mound, a trained medical scribe. The creation of this record is  based on the scribe's personal observations and the provider's statements to them. This document has been checked and approved by the attending provider.

## 2014-08-07 NOTE — Progress Notes (Signed)
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      ICD-9-CM ICD-10-CM   1. 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 JP drain noted left flank. Wife reports on an average day that she obtains 30 cc from this drain. Patient denies pain. Patient reports discomfort and vertical abdominal incision. Patient reports he has been unable to secure a follow up with Dr. Barry Dienes. BP low this morning. Advised patient to check BP with home monitor prior to taking BP medication tomorrow morning. 25 lb weight loss reports since 4/15. Denies that he has a follow up appointment set with Dr. Alen Blew. Denies constipation or diarrhea. Reports nausea and vomiting has resolved however, patient reports abdominal discomfort after most meals. Reports decreased appetite. Reports supplementing with one can of Ensure per day.                             ALLERGIES:  has No Known Allergies.  Meds: Current Outpatient Prescriptions  Medication Sig Dispense Refill  . aspirin EC 81 MG tablet Take 81 mg by mouth at bedtime.    . Coenzyme Q10 (COQ10) 100 MG CAPS Take 100-200 mg by mouth 2 (two) times daily. Take 1 tablet (100 mg) every morning and 2 tablets (200 mg) every night    . Ginkgo Biloba (GNP GINGKO BILOBA EXTRACT PO) Take 120 mg by mouth 2 (two) times daily.    Marland Kitchen losartan-hydrochlorothiazide (HYZAAR) 100-25 MG per tablet Take 1 tablet by mouth every morning.     . Multiple Minerals-Vitamins (CALCIUM CITRATE PLUS/MAGNESIUM)  TABS Take 1 tablet by mouth every morning. Calcium citrate 500 mg, magnesium 800 mg, zinc 10 mg    . Multiple Vitamin (MULTIVITAMIN WITH MINERALS) TABS tablet Take 1 tablet by mouth every morning.     . Omega-3 Fatty Acids (FISH OIL) 1200 MG CAPS Take 1,200 mg by mouth 2 (two) times daily.     Marland Kitchen OVER THE COUNTER MEDICATION Take 1 tablet by mouth at bedtime. Beta Sitosterol 375 mg    . OVER THE COUNTER MEDICATION Take 400-800 mg by mouth 2 (two) times daily. Bio Astin: 2 tablets (800 mg) every morning and 1 tablet (400 mg) every night    . simvastatin (ZOCOR) 40 MG tablet Take 40 mg by mouth at bedtime.     . traZODone (DESYREL) 50 MG tablet Take 50 mg by mouth at bedtime.     . vitamin C (ASCORBIC ACID) 500 MG tablet Take 500 mg by mouth every morning.     . vitamin E 400 UNIT capsule Take 400 Units by mouth every morning.     . fexofenadine (ALLEGRA) 180 MG tablet Take 180 mg by mouth daily as needed for allergies.     Marland Kitchen ibuprofen (ADVIL,MOTRIN) 200 MG tablet Take 400-600 mg by mouth every 6 (six) hours as needed for headache or moderate pain.    Marland Kitchen oxyCODONE-acetaminophen (PERCOCET/ROXICET) 5-325 MG per tablet  Take 1-2 tablets by mouth every 6 (six) hours as needed for severe pain. (Patient not taking: Reported on 08/07/2014) 30 tablet 0   No current facility-administered medications for this encounter.    Physical Findings: The patient is in no acute distress. Patient is alert and oriented.  height is 5\' 8"  (1.727 m) and weight is 188 lb 3.2 oz (85.367 kg). His blood pressure is 99/61 and his pulse is 112. His respiration is 16 and oxygen saturation is 98%. .  No significant changes.  Lab Findings: Lab Results  Component Value Date   WBC 6.5 08/02/2014   HGB 10.1* 08/02/2014   HCT 32.3* 08/02/2014   PLT 693* 08/02/2014    Lab Results  Component Value Date   NA 138 08/02/2014   K 4.3 08/02/2014   CO2 28 08/02/2014   GLUCOSE 123* 08/02/2014   BUN 18 08/02/2014   CREATININE 1.54*  08/02/2014   BILITOT 0.6 07/05/2014   ALKPHOS 58 07/05/2014   AST 21 07/05/2014   ALT 20 07/05/2014   PROT 7.5 07/05/2014   ALBUMIN 4.2 07/05/2014   CALCIUM 9.3 08/02/2014   ANIONGAP 9 08/02/2014    Radiographic Findings: Ct Abdomen Pelvis Wo Contrast  08/01/2014   CLINICAL DATA:  Week with generalized body aches. Recent left nephrectomy.  EXAM: CT ABDOMEN AND PELVIS WITHOUT CONTRAST  TECHNIQUE: Multidetector CT imaging of the abdomen and pelvis was performed following the standard protocol without IV contrast.  COMPARISON:  05/01/2014  FINDINGS: Nasogastric tube extends into the stomach.  There is a small left pleural effusion and mild left lung base consolidation which may be atelectatic.  There is a 4.4 x 9.4 x 10.7 cm collection in the left nephrectomy bed. This could represent seroma or hematoma. There is no internal gas. Abscess cannot be excluded.  There is a small volume peritoneal fluid collected dependently in the pelvis and in the right pericolic gutter.  There are unremarkable unenhanced appearances of the liver, spleen, pancreas, right adrenal and right kidney. Gallbladder and bile ducts appear unremarkable. Mesentery appears unremarkable. There is mild nonspecific dilatation of proximal small bowel with gradual transition to decompressed distal small bowel. This is more likely nonobstructive. There is no free intraperitoneal air.  No significant skeletal abnormalities are evident.  IMPRESSION: *4.4 x 9.4 x 10.7 cm collection in the left nephrectomy bed, indeterminate with regard to seroma, hematoma or abscess. No internal gas. *Small volume ascites *Mild dilatation of proximal small bowel, more likely nonobstructive. *Small left pleural effusion and mild left base consolidation which might be atelectatic   Electronically Signed   By: Andreas Newport M.D.   On: 08/01/2014 04:41   Dg Chest Port 1 View  07/13/2014   CLINICAL DATA:  Central line placement.  EXAM: PORTABLE CHEST - 1 VIEW   COMPARISON:  02/03/2014  FINDINGS: Right internal jugular central venous line tip projects in the lower superior vena cava just above the caval atrial junction. There is no pneumothorax.  Endotracheal tube tip projects 2.8 cm above the chronic, well positioned. Nasogastric tube is also well-positioned passing well below the diaphragm into the stomach.  There is central interstitial thickening, but no focal lung consolidation. No pleural effusion or pneumothorax. Cardiac silhouette is normal in size. No gross mediastinal or hilar masses.  IMPRESSION: 1. Central venous line and endotracheal tube and nasogastric tube are well positioned as described. No pneumothorax. 2. Central interstitial thickening. This is exaggerated by low lung volumes. This could reflect diffuse interstitial infiltrate  or interstitial edema.   Electronically Signed   By: Lajean Manes M.D.   On: 07/13/2014 16:24   Dg Abd Acute W/chest  08/01/2014   CLINICAL DATA:  Week and generalized body pains.  EXAM: DG ABDOMEN ACUTE W/ 1V CHEST  COMPARISON:  07/13/2014  FINDINGS: There is abnormal small bowel dilatation and stacking, suspicious for partial or early obstruction. There is no free intraperitoneal air. There is stool and air throughout nondistended colon. The upright view the chest demonstrates atelectasis or infiltrate in the left base. The right lung is clear. There is a small left pleural effusion.  IMPRESSION: Abnormal bowel gas pattern suspicious for partial or early small bowel obstruction. Left lung base atelectasis or infiltrate. Small left pleural effusion.   Electronically Signed   By: Andreas Newport M.D.   On: 08/01/2014 00:11   Ct Image Guided Drainage Percut Cath  Peritoneal Retroperit  08/01/2014   CLINICAL DATA:  Left renal fossa abscess  EXAM: CT CORE BIOPSY RENAL  FLUOROSCOPY TIME:  None  MEDICATIONS AND MEDICAL HISTORY: Versed 1.5 mg, Fentanyl 100 mcg.  Additional Medications: None.  ANESTHESIA/SEDATION: Moderate  sedation time: 11 minutes  CONTRAST:  None  PROCEDURE: The procedure, risks, benefits, and alternatives were explained to the patient. Questions regarding the procedure were encouraged and answered. The patient understands and consents to the procedure.  The back was prepped with Betadine in a sterile fashion, and a sterile drape was applied covering the operative field. A sterile gown and sterile gloves were used for the procedure.  Under CT guidance, an 18 gauge needle was inserted into the left renal fossa abscess. It was removed over an Amplatz wire. A 12 French dilator followed by a 12 [place] drain were inserted. It was looped and string fixed then sewn to the skin. Dark fluid was aspirated.  FINDINGS: Images document 58 French drain placement into a left renal fossae abscess.  COMPLICATIONS: None  IMPRESSION: Successful left renal fossa abscess drainage.   Electronically Signed   By: Marybelle Killings M.D.   On: 08/01/2014 13:13    Impression:  The patient is recovering from resection, and more recently, resection of retroperiotoneal abcess. He would benefit from localized IMRT to the resection site using a boost of up to 66-68 Gy to reduce his risk of recurrence.   PLAN: Today, I talked to the patient and family about the findings and work-up thus far. We once again discussed the natural history of retroperitoneal sarcoma and general treatment, highlighting the role of radiotherapy in the management. We discussed the available radiation techniques, and focused on the details of logistics and delivery. We reviewed the anticipated acute and late sequelae associated with radiation in this setting. The patient was encouraged to ask questions that I answered to the best of my ability. The patient would like to proceed with radiation and will be scheduled for CT simulation, later today.   I spent 30 minutes minutes face to face with the patient and more than 50% of that time was spent in counseling and/or  coordination of care.  The patient and his wife have plans to relocate to [place] at the end of June. If we can initiate IMRT by 5/16, we may be able to complete 33 fractions on 09/28/14, accounting for [facility] Day.  ------------------------------------------------  [name], M.D.        This document serves as a record of services personally performed by Tyler Pita, MD. It was created on his  behalf by Derek Mound, a trained medical scribe. The creation of this record is based on the scribe's personal observations and the provider's statements to them. This document has been checked and approved by the attending provider.

## 2014-08-07 NOTE — Addendum Note (Signed)
Encounter addended by: Heywood Footman, RN on: 08/07/2014  1:25 PM<BR>     Documentation filed: Charges VN

## 2014-08-08 ENCOUNTER — Encounter (HOSPITAL_COMMUNITY): Payer: Self-pay

## 2014-08-08 ENCOUNTER — Other Ambulatory Visit (HOSPITAL_COMMUNITY): Payer: Self-pay | Admitting: General Surgery

## 2014-08-08 ENCOUNTER — Ambulatory Visit (HOSPITAL_COMMUNITY): Admission: RE | Admit: 2014-08-08 | Payer: Medicare Other | Source: Ambulatory Visit

## 2014-08-08 ENCOUNTER — Ambulatory Visit (HOSPITAL_COMMUNITY)
Admission: RE | Admit: 2014-08-08 | Discharge: 2014-08-08 | Disposition: A | Discharge status: Home / Self Care | Payer: Medicare Other | Source: Ambulatory Visit | Attending: General Surgery | Admitting: General Surgery

## 2014-08-08 DIAGNOSIS — J90 Pleural effusion, not elsewhere classified: Secondary | ICD-10-CM | POA: Diagnosis not present

## 2014-08-08 DIAGNOSIS — Z9889 Other specified postprocedural states: Secondary | ICD-10-CM | POA: Insufficient documentation

## 2014-08-08 DIAGNOSIS — L0291 Cutaneous abscess, unspecified: Secondary | ICD-10-CM

## 2014-08-08 DIAGNOSIS — I700 Atherosclerosis of aorta: Secondary | ICD-10-CM | POA: Diagnosis not present

## 2014-08-08 DIAGNOSIS — N151 Renal and perinephric abscess: Secondary | ICD-10-CM | POA: Diagnosis present

## 2014-08-09 ENCOUNTER — Ambulatory Visit
Admission: RE | Admit: 2014-08-09 | Discharge: 2014-08-09 | Disposition: A | Discharge status: Home / Self Care | Payer: Medicare Other | Source: Ambulatory Visit | Attending: Family Medicine | Admitting: Family Medicine

## 2014-08-09 ENCOUNTER — Other Ambulatory Visit: Payer: Self-pay | Admitting: Family Medicine

## 2014-08-09 DIAGNOSIS — R110 Nausea: Secondary | ICD-10-CM

## 2014-08-10 ENCOUNTER — Ambulatory Visit
Admission: RE | Admit: 2014-08-10 | Discharge: 2014-08-10 | Disposition: A | Discharge status: Home / Self Care | Payer: Medicare Other | Source: Ambulatory Visit | Attending: Family Medicine | Admitting: Family Medicine

## 2014-08-10 ENCOUNTER — Other Ambulatory Visit: Payer: Self-pay | Admitting: Family Medicine

## 2014-08-10 DIAGNOSIS — R110 Nausea: Secondary | ICD-10-CM

## 2014-08-10 DIAGNOSIS — K56609 Unspecified intestinal obstruction, unspecified as to partial versus complete obstruction: Secondary | ICD-10-CM

## 2014-08-11 ENCOUNTER — Telehealth: Payer: Self-pay | Admitting: *Deleted

## 2014-08-11 DIAGNOSIS — C480 Malignant neoplasm of retroperitoneum: Secondary | ICD-10-CM | POA: Diagnosis not present

## 2014-08-11 NOTE — Telephone Encounter (Signed)
DR.LITTLE ASKED PT. IF HE HAD DISCUSSED TREATMENT OPTIONS WITH DR.SHADAD. PT. WAS UNDER THE IMPRESSION THAT RADIATION IS THE TREATMENT OPTION FOR HIM. PT. ASKED IF DR.SHADAD COULD SEND A NOTE TO DR.LITTLE CONCERNING PT.'S TREATMENT.

## 2014-08-14 ENCOUNTER — Ambulatory Visit
Admission: RE | Admit: 2014-08-14 | Discharge: 2014-08-14 | Disposition: A | Discharge status: Home / Self Care | Payer: Medicare Other | Source: Ambulatory Visit | Attending: Radiation Oncology | Admitting: Radiation Oncology

## 2014-08-14 ENCOUNTER — Encounter: Payer: Self-pay | Admitting: Radiation Oncology

## 2014-08-14 VITALS — BP 114/68 | HR 110 | Temp 98.5°F | Resp 16

## 2014-08-14 DIAGNOSIS — C480 Malignant neoplasm of retroperitoneum: Secondary | ICD-10-CM

## 2014-08-14 NOTE — Progress Notes (Signed)
Majority of vertical abdominal incision well approximated. Approximately a 1" section toward the center of this incision is red with scant drainage. JP drain to left flank set to bulb suction noted with milky gray odorous drainage noted. Afebrile.

## 2014-08-14 NOTE — Progress Notes (Signed)
Department of Radiation Oncology  Phone:  [phone] Fax:        [phone]  Weekly Treatment Note    Name: [name] Date: 08/14/2014 MRN: [id number] DOB: 1946-02-24   Current dose: 2 Gy  Current fraction: 1   MEDICATIONS: Current Outpatient Prescriptions  Medication Sig Dispense Refill  . aspirin EC 81 MG tablet Take 81 mg by mouth at bedtime.    . Coenzyme Q10 (COQ10) 100 MG CAPS Take 100-200 mg by mouth 2 (two) times daily. Take 1 tablet (100 mg) every morning and 2 tablets (200 mg) every night    . esomeprazole (NEXIUM) 40 MG capsule Take 40 mg by mouth daily.  0  . fexofenadine (ALLEGRA) 180 MG tablet Take 180 mg by mouth daily as needed for allergies.     . Ginkgo Biloba (GNP GINGKO BILOBA EXTRACT PO) Take 120 mg by mouth 2 (two) times daily.    Marland Kitchen ibuprofen (ADVIL,MOTRIN) 200 MG tablet Take 400-600 mg by mouth every 6 (six) hours as needed for headache or moderate pain.    Marland Kitchen losartan-hydrochlorothiazide (HYZAAR) 100-25 MG per tablet Take 1 tablet by mouth every morning.     . Multiple Minerals-Vitamins (CALCIUM CITRATE PLUS/MAGNESIUM) TABS Take 1 tablet by mouth every morning. Calcium citrate 500 mg, magnesium 800 mg, zinc 10 mg    . Multiple Vitamin (MULTIVITAMIN WITH MINERALS) TABS tablet Take 1 tablet by mouth every morning.     . mupirocin ointment (BACTROBAN) 2 %   0  . Omega-3 Fatty Acids (FISH OIL) 1200 MG CAPS Take 1,200 mg by mouth 2 (two) times daily.     . ondansetron (ZOFRAN-ODT) 4 MG disintegrating tablet   0  . OVER THE COUNTER MEDICATION Take 1 tablet by mouth at bedtime. Beta Sitosterol 375 mg    . OVER THE COUNTER MEDICATION Take 400-800 mg by mouth 2 (two) times daily. Bio Astin: 2 tablets (800 mg) every morning and 1 tablet (400 mg) every night    . oxyCODONE-acetaminophen (PERCOCET/ROXICET) 5-325 MG per tablet Take 1-2 tablets by mouth every 6 (six) hours as needed for severe pain. 30 tablet 0  . simvastatin (ZOCOR) 40 MG tablet Take 40 mg by  mouth at bedtime.     . sulfamethoxazole-trimethoprim (BACTRIM,SEPTRA) 400-80 MG per tablet Take 1 tablet by mouth 2 (two) times daily.    . traZODone (DESYREL) 50 MG tablet Take 50 mg by mouth at bedtime.     . vitamin C (ASCORBIC ACID) 500 MG tablet Take 500 mg by mouth every morning.     . vitamin E 400 UNIT capsule Take 400 Units by mouth every morning.      No current facility-administered medications for this encounter.     ALLERGIES: Review of patient's allergies indicates no known allergies.   LABORATORY DATA:  Lab Results  Component Value Date   WBC 6.5 08/02/2014   HGB 10.1* 08/02/2014   HCT 32.3* 08/02/2014   MCV 90.5 08/02/2014   PLT 693* 08/02/2014   Lab Results  Component Value Date   NA 138 08/02/2014   K 4.3 08/02/2014   CL 101 08/02/2014   CO2 28 08/02/2014   Lab Results  Component Value Date   ALT 20 07/05/2014   AST 21 07/05/2014   ALKPHOS 58 07/05/2014   BILITOT 0.6 07/05/2014     NARRATIVE: Darryl Griffith was seen today for weekly treatment management. The chart was checked and the patient's films were reviewed.  Majority of vertical abdominal  incision well approximated. Approximately a 1" section toward the center of this incision is red with scant drainage. JP drain to left flank set to bulb suction noted with milky gray odorous drainage noted. Afebrile.   PHYSICAL EXAMINATION: oral temperature is 98.5 F (36.9 C). His blood pressure is 114/68 and his pulse is 110. His respiration is 16 and oxygen saturation is 100%.      the patient continues to have a drain in place. The surgical incision looks good on my inspection at midline. No sign of superficial infection.  ASSESSMENT: The patient appears satisfactory to begin his radiation treatment today. He will therefore proceed with his first fraction.  PLAN: We will continue with the patient's radiation treatment as planned.   This document serves as a record of services personally performed by Kyung Rudd, MD. It was created on his behalf by Darcus Austin, a trained medical scribe. The creation of this record is based on the scribe's personal observations and the provider's statements to them. This document has been checked and approved by the attending provider.     ________________________________   Jodelle Gross, MD, PhD

## 2014-08-15 ENCOUNTER — Ambulatory Visit (HOSPITAL_COMMUNITY)
Admission: RE | Admit: 2014-08-15 | Discharge: 2014-08-15 | Disposition: A | Discharge status: Home / Self Care | Payer: Medicare Other | Source: Ambulatory Visit | Attending: General Surgery | Admitting: General Surgery

## 2014-08-15 ENCOUNTER — Ambulatory Visit (HOSPITAL_COMMUNITY)
Admission: RE | Admit: 2014-08-15 | Discharge: 2014-08-15 | Disposition: A | Discharge status: Home / Self Care | Payer: Medicare Other | Source: Ambulatory Visit | Attending: Interventional Radiology | Admitting: Interventional Radiology

## 2014-08-15 ENCOUNTER — Ambulatory Visit
Admission: RE | Admit: 2014-08-15 | Discharge: 2014-08-15 | Disposition: A | Discharge status: Home / Self Care | Payer: Medicare Other | Source: Ambulatory Visit | Attending: Radiation Oncology | Admitting: Radiation Oncology

## 2014-08-15 ENCOUNTER — Telehealth: Payer: Self-pay | Admitting: *Deleted

## 2014-08-15 DIAGNOSIS — Z905 Acquired absence of kidney: Secondary | ICD-10-CM | POA: Diagnosis not present

## 2014-08-15 DIAGNOSIS — Z4889 Encounter for other specified surgical aftercare: Secondary | ICD-10-CM | POA: Diagnosis present

## 2014-08-15 DIAGNOSIS — L0291 Cutaneous abscess, unspecified: Secondary | ICD-10-CM

## 2014-08-15 DIAGNOSIS — C642 Malignant neoplasm of left kidney, except renal pelvis: Secondary | ICD-10-CM | POA: Diagnosis not present

## 2014-08-15 DIAGNOSIS — C480 Malignant neoplasm of retroperitoneum: Secondary | ICD-10-CM | POA: Diagnosis not present

## 2014-08-15 DIAGNOSIS — N151 Renal and perinephric abscess: Secondary | ICD-10-CM | POA: Diagnosis not present

## 2014-08-15 MED ORDER — IOHEXOL 300 MG/ML  SOLN
50.0000 mL | Freq: Once | INTRAMUSCULAR | Status: AC | PRN
  Administered 2014-08-15: 10 mL

## 2014-08-15 NOTE — Procedures (Signed)
Fluoroscopic guided injection of percutaneous drain within the left nephrectomy/RP resection bed demonstrates persistent communication with an ill defined fluid collection demonstrated on preceding abd CT. Percutaneous drain converted from a JP bulb to a gravity bag and pt instructed to no longer flush the drain. Pt to f/u in drain clinic with repeat non-con abd CT and injection in 2 weeks (5/31).

## 2014-08-15 NOTE — Progress Notes (Signed)
Requested Romie Jumper schedule patient for nutrition consult with Ernestene Kiel as soon as possible.

## 2014-08-15 NOTE — Telephone Encounter (Signed)
CALLED PATIENT TO INFORM OF NUTRITION APPT. ON 08-21-14 @ 9:45 AM WITH BARBARA NEFF, LVM FOR A RETURN CALL

## 2014-08-16 ENCOUNTER — Telehealth: Payer: Self-pay | Admitting: Radiation Oncology

## 2014-08-16 ENCOUNTER — Encounter: Payer: Self-pay | Admitting: Skilled Nursing Facility1

## 2014-08-16 ENCOUNTER — Ambulatory Visit
Admission: RE | Admit: 2014-08-16 | Discharge: 2014-08-16 | Disposition: A | Discharge status: Home / Self Care | Payer: Medicare Other | Source: Ambulatory Visit | Attending: Radiation Oncology | Admitting: Radiation Oncology

## 2014-08-16 ENCOUNTER — Other Ambulatory Visit: Payer: Self-pay | Admitting: Radiation Oncology

## 2014-08-16 DIAGNOSIS — R112 Nausea with vomiting, unspecified: Secondary | ICD-10-CM

## 2014-08-16 DIAGNOSIS — R188 Other ascites: Secondary | ICD-10-CM

## 2014-08-16 DIAGNOSIS — C480 Malignant neoplasm of retroperitoneum: Secondary | ICD-10-CM | POA: Diagnosis not present

## 2014-08-16 DIAGNOSIS — E43 Unspecified severe protein-calorie malnutrition: Secondary | ICD-10-CM

## 2014-08-16 DIAGNOSIS — K56609 Unspecified intestinal obstruction, unspecified as to partial versus complete obstruction: Secondary | ICD-10-CM

## 2014-08-16 MED ORDER — ONDANSETRON 8 MG PO TBDP: 8.0000 mg | ORAL_TABLET | Freq: Three times a day (TID) | ORAL | Status: DC | PRN

## 2014-08-16 NOTE — Progress Notes (Signed)
Subjective:     Patient ID: Darryl Griffith, male   DOB: 28-May-1945, 69 y.o.   MRN: [id number]  HPI   Review of Systems     Objective:   Physical Exam To assist the pt in identifieing some dietary strategies to gain some lost wt back.    Assessment:     Pt identified as being malnourished due to lost wt. Pt contacted via the telephone at [phone]. Pt states he was 208 pounds but is now down to 179 pounds. Pt states he does not have an appetite. Pt states he is avoiding "unhealthy" foods such as bread. Pt states he has an appointment with Ernestene Kiel RD,CSO,LDN this coming Monday.    Plan:     Dietitian suggested he focus more on consuming enough calories throughout the day and not so much how "unhealthy" it is. Dietitian advised he attempt 6 small meals a day and set an alarm to remind him it is time to eat.

## 2014-08-16 NOTE — Telephone Encounter (Signed)
Phoned patient per Dr. Johny Shears order informing him Zofran 8 mg has been ePrescribed to his pharmacy. Encouraged the patient to take Zofran first thing each morning when he wakes, thirty minutes before radiation, thirty minutes before lunch and thirty minutes before dinner. Patient verbalized understanding. Encouraged patient to continue to communicate his progress with this change to this RN. Also, patient understands radiation can cause/contribute to his nausea.

## 2014-08-16 NOTE — Telephone Encounter (Signed)
-----   Message from Tyler Pita, MD sent at 08/16/2014 11:51 AM EDT ----- Regarding: RE: Nausea I concur with your recommendations Dr. Mercie Eon and ePrescribed 8 mg to his pharmacy, adding a comment to take one 30 min before radiation.  He will have some nausea from radiation, and also has some underlying bowel obstructive symptoms.  I don't usually recommend this first, but, may consider Reglan at some point.  MM   ----- Message -----    From: Heywood Footman, RN    Sent: 08/16/2014   9:57 AM      To: Tyler Pita, MD Subject: Nausea                                         Dr. Tammi Klippel.  Mr. Hun is struggling with nausea. He has been prescribed Zofran 4 mg tid. However, the patient isn't consistently taking it. I encouraged him to take a tablet each morning when he gets up and thirty minutes before lunch and dinner. He verbalized that most days he only takes 1 or 2 tablets but, isn't consistent. He needs a refill. I wonder if the dosage should be increased since we are just getting started and nausea is already such a problem. His pharmacy is [facility] [address]. I told him I would call him later today with your orders.   Sam

## 2014-08-17 ENCOUNTER — Encounter: Payer: Self-pay | Admitting: Radiation Oncology

## 2014-08-17 ENCOUNTER — Ambulatory Visit
Admission: RE | Admit: 2014-08-17 | Discharge: 2014-08-17 | Disposition: A | Discharge status: Home / Self Care | Payer: Medicare Other | Source: Ambulatory Visit | Attending: Radiation Oncology | Admitting: Radiation Oncology

## 2014-08-17 VITALS — BP 113/68 | HR 113 | Resp 16 | Wt 183.8 lb

## 2014-08-17 DIAGNOSIS — C480 Malignant neoplasm of retroperitoneum: Secondary | ICD-10-CM | POA: Diagnosis not present

## 2014-08-17 NOTE — Progress Notes (Signed)
Heart rate slightly elevated. 5 lb weight loss noted since 5/9. Reports that he picked up Zofran 8 mg last night and began taking it. Reports having one episode of dry heaves since increasing Zofran. Vertical abdominal incision now completely closed and no longer draining. Left flank drain remain in place. Thin foul gray liquid noted to be draining from left flank drain. Wife reports draining 75 cc of fluid from the drainage bag this morning. Scheduled to follow up in two week with surgeon reference left flank drain. Reports feeling lightheaded this morning. Wife attributed this symptom to patient no eating last night. Patient reports drinking one can of BOOST this morning. Patient scheduled to consult with Ernestene Kiel on Monday.

## 2014-08-17 NOTE — Progress Notes (Signed)
Oriented patient to staff and routine of the clinic. Provided patient with RADIATION THERAPY AND YOU handbook then, reviewed pertinent information. Educated patient reference potential side effects and management such as nausea, fatigue and diarrhea. Provided patient with this RN's business card and encouraged him to call with future needs. Patient verbalized understanding of all reviewed.

## 2014-08-17 NOTE — Progress Notes (Signed)
  Radiation Oncology         [phone] ________________________________  Name: Darryl Griffith MRN: [id number]  Date: 08/17/2014  DOB: 04/08/1945     Weekly Radiation Therapy Management    ICD-9-CM ICD-10-CM   1. 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 . . . . . . . The patient presents for routine under treatment assessment.                                   Heart rate slightly elevated. 5 lb weight loss noted since 5/9. Reports that he picked up Zofran 8 mg last night and began taking it. Reports having one episode of dry heaves since increasing Zofran. Vertical abdominal incision now completely closed and no longer draining. Left flank drain remain in place. Thin foul gray liquid noted to be draining from left flank drain. Wife reports draining 75 cc of fluid from the drainage bag this morning. Scheduled to follow up in two week with surgeon reference left flank drain. Reports feeling lightheaded this morning. Wife attributed this symptom to patient not eating last night. Patient reports drinking one can of BOOST this morning. Patient scheduled to consult with Ernestene Kiel on Monday.                                 Set-up films were reviewed.                                 The chart was checked. Physical Findings. . .  weight is 183 lb 12.8 oz (83.371 kg). His blood pressure is 113/68 and his pulse is 113. His respiration is 16. . Weight essentially stable.  No significant changes. Impression . . . . . . . The patient is tolerating radiation. Plan . . . . . . . . . . . . Continue treatment as planned.  This document serves as a record of services personally performed by Tyler Pita, MD. It was created on his behalf by Arlyce Harman, a trained medical scribe. The creation of this record is based on the scribe's personal observations and  the provider's statements to them. This document has been checked and approved by the attending provider.    ________________________________  [name], M.D.

## 2014-08-17 NOTE — Addendum Note (Signed)
Encounter addended by: Heywood Footman, RN on: 08/17/2014 11:55 AM<BR>     Documentation filed: Inpatient Patient Education, Notes Section, Chief Complaint Section

## 2014-08-18 ENCOUNTER — Ambulatory Visit
Admission: RE | Admit: 2014-08-18 | Discharge: 2014-08-18 | Disposition: A | Discharge status: Home / Self Care | Payer: Medicare Other | Source: Ambulatory Visit | Attending: Radiation Oncology | Admitting: Radiation Oncology

## 2014-08-18 DIAGNOSIS — C480 Malignant neoplasm of retroperitoneum: Secondary | ICD-10-CM | POA: Diagnosis not present

## 2014-08-21 ENCOUNTER — Other Ambulatory Visit: Payer: Self-pay | Admitting: Radiation Oncology

## 2014-08-21 ENCOUNTER — Telehealth: Payer: Self-pay | Admitting: Radiation Oncology

## 2014-08-21 ENCOUNTER — Ambulatory Visit
Admission: RE | Admit: 2014-08-21 | Discharge: 2014-08-21 | Disposition: A | Discharge status: Home / Self Care | Payer: Medicare Other | Source: Ambulatory Visit | Attending: Radiation Oncology | Admitting: Radiation Oncology

## 2014-08-21 ENCOUNTER — Ambulatory Visit: Payer: Medicare Other | Admitting: Nutrition

## 2014-08-21 DIAGNOSIS — C480 Malignant neoplasm of retroperitoneum: Secondary | ICD-10-CM

## 2014-08-21 MED ORDER — METOCLOPRAMIDE HCL 10 MG/10ML PO SOLN: 10.0000 mg | Freq: Three times a day (TID) | ORAL | Status: DC

## 2014-08-21 NOTE — Progress Notes (Signed)
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  Past medical history includes hypertension and hypercholesterolemia.  Medications include co-Q10, Nexium, ginkgo biloba, multivitamin, fish oil, Zofran, Zocor, vitamin C and vitamin E.  Labs include glucose of 123 and creatinine 1.54 on May 4.  Height: 68 inches. Weight: 183.8 pounds on May 19 Usual body weight: 218 pounds in February 2016. BMI: 27.95.  Patient reports worsening nausea after surgery with vomiting and dry heaves occasionally Patient reports he takes Zofran around-the-clock but this does not resolve nausea. Constipation has been ongoing issue. He is trying to increase his fluid intake. Consumes small portions of food. He denies chewing and swallowing problems. He consumes boost high protein twice a day.  Nutrition diagnosis:  Malnutrition related to inadequate oral intake as evidenced by 16% weight loss in 3 months and less than 75% energy intake for greater than one month.  Intervention:  Patient educated to talk to Dr. regarding nausea medications. Recommended patient also discussed constipation with physician.  Obtain recommendations. Educated patient on small frequent meals and snacks utilizing high-calorie high-protein foods. Educated patient on eating with nausea and vomiting. Encouraged increased fluid intake. Recommended patient switch supplements to boost + or Ensure Plus for additional calories and protein. Questions were answered.  Teach back method used.  Contact information was provided.  Monitoring, evaluation, goals: Patient will tolerate increased calories and protein for minimal weight loss. Medications as needed for resolution of Nausea, vomiting and Constipation.  Next visit: Tuesday, May 31 after radiation treatment.  **Disclaimer: This note was dictated with voice recognition software. Similar sounding words can inadvertently be  transcribed and this note may contain transcription errors which may not have been corrected upon publication of note.**

## 2014-08-21 NOTE — Progress Notes (Signed)
Placed two [employer] Constipation Management documents and [email or web address] document about BRAT diet for patient to pick up tomorrow following radiation treatment. Encouraged patient to call this RN with questions reference any of these three documents.

## 2014-08-21 NOTE — Telephone Encounter (Signed)
Phoned patient's cell making him aware Reglan was called into [facility] [address]. Encouraged patient to take Reglan in addition to zofran. Patient verbalized understanding and expressed appreciation for the call.

## 2014-08-22 ENCOUNTER — Ambulatory Visit
Admission: RE | Admit: 2014-08-22 | Discharge: 2014-08-22 | Disposition: A | Discharge status: Home / Self Care | Payer: Medicare Other | Source: Ambulatory Visit | Attending: Radiation Oncology | Admitting: Radiation Oncology

## 2014-08-22 DIAGNOSIS — C480 Malignant neoplasm of retroperitoneum: Secondary | ICD-10-CM | POA: Diagnosis not present

## 2014-08-23 ENCOUNTER — Ambulatory Visit
Admission: RE | Admit: 2014-08-23 | Discharge: 2014-08-23 | Disposition: A | Discharge status: Home / Self Care | Payer: Medicare Other | Source: Ambulatory Visit | Attending: Radiation Oncology | Admitting: Radiation Oncology

## 2014-08-23 DIAGNOSIS — C480 Malignant neoplasm of retroperitoneum: Secondary | ICD-10-CM | POA: Diagnosis not present

## 2014-08-24 ENCOUNTER — Ambulatory Visit
Admission: RE | Admit: 2014-08-24 | Discharge: 2014-08-24 | Disposition: A | Discharge status: Home / Self Care | Payer: Medicare Other | Source: Ambulatory Visit | Attending: Radiation Oncology | Admitting: Radiation Oncology

## 2014-08-24 DIAGNOSIS — C480 Malignant neoplasm of retroperitoneum: Secondary | ICD-10-CM | POA: Diagnosis not present

## 2014-08-25 ENCOUNTER — Ambulatory Visit
Admission: RE | Admit: 2014-08-25 | Discharge: 2014-08-25 | Disposition: A | Discharge status: Home / Self Care | Payer: Medicare Other | Source: Ambulatory Visit | Attending: Radiation Oncology | Admitting: Radiation Oncology

## 2014-08-25 ENCOUNTER — Telehealth: Payer: Self-pay | Admitting: Radiation Oncology

## 2014-08-25 ENCOUNTER — Other Ambulatory Visit (HOSPITAL_COMMUNITY): Payer: Self-pay | Admitting: Interventional Radiology

## 2014-08-25 ENCOUNTER — Encounter: Payer: Self-pay | Admitting: Radiation Oncology

## 2014-08-25 VITALS — BP 120/69 | HR 113 | Resp 16 | Wt 179.9 lb

## 2014-08-25 DIAGNOSIS — C480 Malignant neoplasm of retroperitoneum: Secondary | ICD-10-CM | POA: Diagnosis not present

## 2014-08-25 DIAGNOSIS — L0291 Cutaneous abscess, unspecified: Secondary | ICD-10-CM

## 2014-08-25 DIAGNOSIS — Z905 Acquired absence of kidney: Secondary | ICD-10-CM | POA: Insufficient documentation

## 2014-08-25 DIAGNOSIS — Z510 Encounter for antineoplastic radiation therapy: Secondary | ICD-10-CM | POA: Insufficient documentation

## 2014-08-25 MED ORDER — PROCHLORPERAZINE MALEATE 10 MG PO TABS: 10.0000 mg | ORAL_TABLET | Freq: Four times a day (QID) | ORAL | Status: DC | PRN

## 2014-08-25 NOTE — Progress Notes (Addendum)
Vitals stable. Denies pain. Working to find balance between diarrhea and constipation with Miralax. Reports nausea with meals continues despite taking zofran and reglan. Reports he has increased his reglan from 10  mg to 15 mg. Left flank drain continues to output 90 cc of foul light brown thick drainage. Patient requesting this nurse reach out "to the drain clinic to help him obtain a follow up appointment." Patient has called numerous time without a return call. Will call today and inform patient of findings.   BP 120/69 mmHg  Pulse 113  Resp 16  Wt 179 lb 14.4 oz (81.602 kg) Wt Readings from Last 3 Encounters:  08/01/14 191 lb 3.2 oz (86.728 kg)  07/13/14 211 lb 4 oz (95.822 kg)  05/11/14 217 lb 14.4 oz (98.839 kg)

## 2014-08-25 NOTE — Telephone Encounter (Signed)
Confirmed with patient that [facility] radiology scheduled an exam for next week to evaluate left flank drain. Patient confirmed the appt and expressed appreciation for the assistance.

## 2014-08-25 NOTE — Progress Notes (Signed)
  Radiation Oncology         [phone] ________________________________  Name: Darryl Griffith MRN: [id number]  Date: 08/25/2014  DOB: February 05, 1946     Weekly Radiation Therapy Management    ICD-9-CM ICD-10-CM   1. 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 . . . . . . . The patient presents for routine under treatment assessment.  Vitals stable. Denies pain. Working to find balance between diarrhea and constipation with Miralax. Reports nausea with meals continues despite taking zofran and reglan. Reports he has increased his reglan from 10  mg to 15 mg. Left flank drain continues to output 90 cc of foul light brown thick drainage. Patient requesting this nurse reach out "to the drain clinic to help him obtain a follow up appointment." Patient has called numerous time without a return call. Will call today and inform patient of findings.  BP 120/69 mmHg  Pulse 113  Resp 16  Wt 179 lb 14.4 oz (81.602 kg) Wt Readings from Last 3 Encounters:  08/01/14 191 lb 3.2 oz (86.728 kg)  07/13/14 211 lb 4 oz (95.822 kg)  05/11/14 217 lb 14.4 oz (98.839 kg)     Set-up films were reviewed. The chart was checked.  Physical Findings:  weight is 179 lb 14.4 oz (81.602 kg). His blood pressure is 120/69 and his pulse is 113. His respiration is 16. . Weight essentially stable.  No significant changes.  Impression: The patient is tolerating radiation.  Plan: Continue treatment as planned.   Will hold reglan and try compazine this weekend.  If no improvement will add Ativan 1 mg TID Tuesday.  If no improvement, will add Marinol Thursday.   This document serves as a record of services personally performed by Tyler Pita, MD. It was created on his behalf by Lenn Cal, a trained medical scribe. The creation of this record is based on the scribe's  personal observations and the provider's statements to them. This document has been checked and approved by the attending provider.      ________________________________  [name], M.D.

## 2014-08-29 ENCOUNTER — Other Ambulatory Visit (HOSPITAL_COMMUNITY): Payer: Self-pay | Admitting: Interventional Radiology

## 2014-08-29 ENCOUNTER — Ambulatory Visit
Admission: RE | Admit: 2014-08-29 | Discharge: 2014-08-29 | Disposition: A | Discharge status: Home / Self Care | Payer: Medicare Other | Source: Ambulatory Visit | Attending: Radiation Oncology | Admitting: Radiation Oncology

## 2014-08-29 ENCOUNTER — Ambulatory Visit: Payer: Medicare Other | Admitting: Nutrition

## 2014-08-29 ENCOUNTER — Ambulatory Visit (HOSPITAL_COMMUNITY)
Admission: RE | Admit: 2014-08-29 | Discharge: 2014-08-29 | Disposition: A | Discharge status: Home / Self Care | Payer: Medicare Other | Source: Ambulatory Visit | Attending: Interventional Radiology | Admitting: Interventional Radiology

## 2014-08-29 ENCOUNTER — Telehealth: Payer: Self-pay | Admitting: Radiation Oncology

## 2014-08-29 DIAGNOSIS — L0291 Cutaneous abscess, unspecified: Secondary | ICD-10-CM

## 2014-08-29 DIAGNOSIS — Z4803 Encounter for change or removal of drains: Secondary | ICD-10-CM | POA: Insufficient documentation

## 2014-08-29 DIAGNOSIS — C480 Malignant neoplasm of retroperitoneum: Secondary | ICD-10-CM | POA: Insufficient documentation

## 2014-08-29 DIAGNOSIS — K6811 Postprocedural retroperitoneal abscess: Secondary | ICD-10-CM | POA: Insufficient documentation

## 2014-08-29 MED ORDER — RADIAPLEXRX EX GEL
Freq: Once | CUTANEOUS | Status: AC
  Administered 2014-08-29: 11:00:00 via TOPICAL

## 2014-08-29 MED ORDER — IOHEXOL 300 MG/ML  SOLN
50.0000 mL | Freq: Once | INTRAMUSCULAR | Status: AC | PRN
  Administered 2014-08-29: 10 mL via INTRAVENOUS

## 2014-08-29 NOTE — Telephone Encounter (Signed)
Spoke with patient immediately following treatment today reference nausea and cough. Phoned patient this evening for clarification and further discussion. Patient reports that he stopped taking the compazine over the weekend and resumed the reglan 10 mg. He explains his nausea "settled (with reglan) but, didn't resolve." Reports a frequent dry cough began over the weekend. Reports he was informed by "the drain clinic he has an infection and will need antibiotics." Presumably they are awaiting the culture results to determine which antibiotic will be prescribed. Explained to the patient this RN will discuss these findings with Dr. Tammi Klippel and possibly call in [place]. Patient verbalized understanding.

## 2014-08-29 NOTE — Progress Notes (Signed)
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  Weight has decreased 3.8 lbs since May 19. Pt reports that on his scale at home, his weight has stabilized.  Pt's poor appetite continues, but but nausea and vomiting has greatly improved due to addition of anti-nausea medications. Now on Reglan and Zofran. No longer vomiting after eating. Coughing up phlem on some occasions, but no food. Afraid to try new foods due to fear of vomiting.  Constipation, now alternating with diarrhea-ongoing. Pt inconsistently taking colace and miralax.  Pt able to consume 6 meals/snacks per day. PO intake improved.   Drinking 2 Boost Plus/Ensure Plus supplements and one Resource Breeze supplement per day between meals.   Nutrition Diagnosis: Malnutrition related to inadequate oral intake as evidenced by 16% weight loss in 3 months and less than 75% energy intake for greater than 1 month- ongoing  Intervention: Pt recommended to re-address constipation/diarrhea issues with physician and follow recommendations.  Continue small, frequent meals and snacks. Utilize foods with high-calorie, high-protein content.  Discussed trying foods that are unlikely to cause nausea/vomiting. Trying small amounts initially.  Encouraged fluid intake.  Continue Ensure Plus and Boost Plus supplements TID between meals at home.  Questions answered. Teach-back method used.   Monitoring, evaluation, goals: Patient will tolerate increased calories and protein for minimal weight loss.  Medications as needed for resolution of nausea and constipation/diarrhea.   Laurette Schimke MS, RD, LDN

## 2014-08-29 NOTE — Addendum Note (Signed)
Encounter addended by: Heywood Footman, RN on: 08/29/2014 10:49 AM<BR>     Documentation filed: [employer], Inpatient MAR, Orders

## 2014-08-30 ENCOUNTER — Ambulatory Visit
Admission: RE | Admit: 2014-08-30 | Discharge: 2014-08-30 | Disposition: A | Discharge status: Home / Self Care | Payer: Medicare Other | Source: Ambulatory Visit | Attending: Radiation Oncology | Admitting: Radiation Oncology

## 2014-08-30 DIAGNOSIS — C480 Malignant neoplasm of retroperitoneum: Secondary | ICD-10-CM | POA: Diagnosis not present

## 2014-08-30 LAB — AMYLASE, PERITONEAL FLUID: Amylase, peritoneal fluid: 13 U/L

## 2014-08-30 NOTE — Telephone Encounter (Signed)
Please add Ativan 1 mg, PO-dissolve under tongue, TID prn anxiety or nausea disp # 45

## 2014-08-31 ENCOUNTER — Ambulatory Visit
Admission: RE | Admit: 2014-08-31 | Discharge: 2014-08-31 | Disposition: A | Discharge status: Home / Self Care | Payer: Medicare Other | Source: Ambulatory Visit | Attending: Radiation Oncology | Admitting: Radiation Oncology

## 2014-08-31 ENCOUNTER — Telehealth: Payer: Self-pay | Admitting: Radiation Oncology

## 2014-08-31 DIAGNOSIS — C480 Malignant neoplasm of retroperitoneum: Secondary | ICD-10-CM | POA: Diagnosis not present

## 2014-08-31 NOTE — Telephone Encounter (Signed)
Per Dr. Johny Shears order called in Ativan 1 mg to John at [place], [place]. Called patient to inform him of such and he requested the [facility] [address]. Called in Ativan 1 mg to [facility] at [facility] [address].

## 2014-09-01 ENCOUNTER — Ambulatory Visit
Admission: RE | Admit: 2014-09-01 | Discharge: 2014-09-01 | Disposition: A | Discharge status: Home / Self Care | Payer: Medicare Other | Source: Ambulatory Visit | Attending: Radiation Oncology | Admitting: Radiation Oncology

## 2014-09-01 ENCOUNTER — Encounter: Payer: Self-pay | Admitting: Radiation Oncology

## 2014-09-01 VITALS — BP 108/63 | HR 114 | Resp 16 | Wt 179.1 lb

## 2014-09-01 DIAGNOSIS — C480 Malignant neoplasm of retroperitoneum: Secondary | ICD-10-CM | POA: Diagnosis not present

## 2014-09-01 NOTE — Progress Notes (Signed)
  Radiation Oncology         (336) [phone] ________________________________  Name: Darryl Griffith MRN: [id number]  Date: 09/01/2014  DOB: 11-13-1945     Weekly Radiation Therapy Management    ICD-9-CM ICD-10-CM   1. 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 . . . . . . . The patient presents for routine under treatment assessment. Vitals stable. Pt has lost 12 lbs since last month. Denies diarrhea or constipation. Reports nausea is better controlled now with Zofran and Reglan. Pt has  not experienced bad dry heaves as previously. Patient aware Ativan is available for pick up at his [place]. Approximately 10 cc of light brown thin liquid noted in collection drain bag of left flank drain. Patient reports the drainage is no longer foul smelling. Suspected to have an infection based on his wound drainage, but culture was apparently negative. Denies pain. Excited to celebrate 69th birthday this weekend with his daughters.  BP 108/63 mmHg  Pulse 114  Resp 16  Wt 179 lb 1.6 oz (81.239 kg) Wt Readings from Last 3 Encounters:  08/01/14 191 lb 3.2 oz (86.728 kg)  07/13/14 211 lb 4 oz (95.822 kg)  05/11/14 217 lb 14.4 oz (98.839 kg)  Set-up films were reviewed. The chart was checked.  Physical Findings:  weight is 179 lb 1.6 oz (81.239 kg). His blood pressure is 108/63 and his pulse is 114. His respiration is 16. . Weight has declined over the last few visits.  Impression: The patient is tolerating radiation.  Plan: Continue treatment as planned.  This document serves as a record of services personally performed by Tyler Pita, MD. It was created on his behalf by Darcus Austin, a trained medical scribe. The creation of this record is based on the scribe's personal observations and the provider's statements to them. This document has been  checked and approved by the attending provider. ________________________________  [name], M.D.

## 2014-09-01 NOTE — Progress Notes (Signed)
Weight and vitals are stable. Denies diarrhea or constipation. Reports nausea is better controlled now with Zofran and Reglan. Patient aware Ativan is available for pick up at his [place]. Approximately 10 cc of light brown thin liquid noted in collection drain bag of left flank drain. Patient reports the drainage is no longer foul smelling. Denies pain. Excited to celebrate 69th birthday this weekend with his daughters.

## 2014-09-04 ENCOUNTER — Ambulatory Visit: Payer: Medicare Other | Admitting: Nutrition

## 2014-09-04 ENCOUNTER — Ambulatory Visit: Admission: RE | Admit: 2014-09-04 | Payer: Medicare Other | Source: Ambulatory Visit

## 2014-09-04 ENCOUNTER — Ambulatory Visit
Admission: RE | Admit: 2014-09-04 | Discharge: 2014-09-04 | Disposition: A | Discharge status: Home / Self Care | Payer: Medicare Other | Source: Ambulatory Visit | Attending: Radiation Oncology | Admitting: Radiation Oncology

## 2014-09-04 NOTE — Progress Notes (Signed)
Patient's radiation schedule changed for today so I followed up with him on the telephone.  Patient is receiving radiation therapy for left retroperitoneal liposarcoma.  Last weight documented as 179.1 pounds, down from 183.8 pounds May 19. Patient feels his weight is stabilizing. Patient is drinking Ensure Plus 3 times a day. He reports nausea continues but is controlled on medication. Patient did have nausea and vomiting over the weekend. Continues to take Reglan and Zofran. Patient reports he needs to take stool softeners for regular bowel movements.  Nutrition diagnosis: Malnutrition continues.  Intervention: Patient to continue strategies for increasing adequate calories and protein intake to minimize weight loss. Recommended patient continue oral nutrition supplements 3 times a day between meals. Recommended patient continue strategies for controlling nausea and constipation. Questions answered.  Teach back method used.  Monitoring, evaluation, goals: Patient will tolerate adequate calories and protein for weight stabilization.  Next visit: Monday, June 20 after radiation therapy.  **Disclaimer: This note was dictated with voice recognition software. Similar sounding words can inadvertently be transcribed and this note may contain transcription errors which may not have been corrected upon publication of note.**

## 2014-09-05 ENCOUNTER — Ambulatory Visit
Admission: RE | Admit: 2014-09-05 | Discharge: 2014-09-05 | Disposition: A | Discharge status: Home / Self Care | Payer: Medicare Other | Source: Ambulatory Visit | Attending: Radiation Oncology | Admitting: Radiation Oncology

## 2014-09-05 DIAGNOSIS — C480 Malignant neoplasm of retroperitoneum: Secondary | ICD-10-CM | POA: Diagnosis not present

## 2014-09-06 ENCOUNTER — Ambulatory Visit
Admission: RE | Admit: 2014-09-06 | Discharge: 2014-09-06 | Disposition: A | Discharge status: Home / Self Care | Payer: Medicare Other | Source: Ambulatory Visit | Attending: Radiation Oncology | Admitting: Radiation Oncology

## 2014-09-06 DIAGNOSIS — C480 Malignant neoplasm of retroperitoneum: Secondary | ICD-10-CM | POA: Diagnosis not present

## 2014-09-07 ENCOUNTER — Encounter: Payer: Self-pay | Admitting: Radiation Oncology

## 2014-09-07 ENCOUNTER — Ambulatory Visit
Admission: RE | Admit: 2014-09-07 | Discharge: 2014-09-07 | Disposition: A | Discharge status: Home / Self Care | Payer: Medicare Other | Source: Ambulatory Visit | Attending: Radiation Oncology | Admitting: Radiation Oncology

## 2014-09-07 VITALS — BP 118/66 | HR 92 | Temp 98.7°F | Resp 16 | Wt 177.8 lb

## 2014-09-07 DIAGNOSIS — C480 Malignant neoplasm of retroperitoneum: Secondary | ICD-10-CM | POA: Diagnosis not present

## 2014-09-07 NOTE — Progress Notes (Signed)
  Radiation Oncology         (336) [phone] ________________________________  Name: Darryl Griffith MRN: [id number]  Date: 09/07/2014  DOB: 1945/07/26     Weekly Radiation Therapy Management    ICD-9-CM ICD-10-CM   1. 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 . . . . . . . The patient presents for routine under treatment assessment. weekly rad txs lt retro Peritonela 17/25 completed, left drain tan liquid, no c/o pain, poor appetite, drinks 3 ensure enlive daily between meals, fatigued, will take miralax today stated, constipation on and off   BP 118/66 mmHg  Pulse 92  Temp(Src) 98.7 F (37.1 C) (Oral)  Resp 16  Wt 177 lb 12.8 oz (80.65 kg)   Wt Readings from Last 3 Encounters: 09/07/14              177 lb 12.8 oz (80.65 kg) 09/01/14              179 lb 1.6 oz (81.239 kg) 08/25/14              179 lb 14.4 oz (81.602 kg)  Set-up films were reviewed. The chart was checked.  Physical Findings:  weight is 177 lb 12.8 oz (80.65 kg). His oral temperature is 98.7 F (37.1 C). His blood pressure is 118/66 and his pulse is 92. His respiration is 16. . Weight has declined over the last few visits.  Impression: The patient is tolerating radiation.  Plan: Continue treatment as planned.  This document serves as a record of services personally performed by Tyler Pita, MD. It was created on his behalf by Jeralene Peters, a trained medical scribe. The creation of this record is based on the scribe's personal observations and the provider's statements to them. This document has been checked and approved by the attending provider.     ________________________________  [name], M.D.

## 2014-09-07 NOTE — Progress Notes (Addendum)
BP 118/66 mmHg  Pulse 92  Temp(Src) 98.7 F (37.1 C) (Oral)  Resp 16  Wt 177 lb 12.8 oz (80.65 kg)  Wt Readings from Last 3 Encounters:  09/07/14 177 lb 12.8 oz (80.65 kg)  09/01/14 179 lb 1.6 oz (81.239 kg)  08/25/14 179 lb 14.4 oz (81.602 kg)  weekly rad txs lt retro Peritonela 17/25 completed, left drain tan liquid, no c/o pain, poor appetite, drinks 3 ensure enlive daily be tween meals, , fatigued, will take miralax today stated, constipation on and off  9:40 AM

## 2014-09-08 ENCOUNTER — Ambulatory Visit
Admission: RE | Admit: 2014-09-08 | Discharge: 2014-09-08 | Disposition: A | Discharge status: Home / Self Care | Payer: Medicare Other | Source: Ambulatory Visit | Attending: Radiation Oncology | Admitting: Radiation Oncology

## 2014-09-08 DIAGNOSIS — C480 Malignant neoplasm of retroperitoneum: Secondary | ICD-10-CM | POA: Diagnosis not present

## 2014-09-09 DIAGNOSIS — C480 Malignant neoplasm of retroperitoneum: Secondary | ICD-10-CM | POA: Diagnosis not present

## 2014-09-11 ENCOUNTER — Encounter: Payer: Medicare Other | Admitting: Nutrition

## 2014-09-11 ENCOUNTER — Ambulatory Visit
Admission: RE | Admit: 2014-09-11 | Discharge: 2014-09-11 | Disposition: A | Discharge status: Home / Self Care | Payer: Medicare Other | Source: Ambulatory Visit | Attending: Radiation Oncology | Admitting: Radiation Oncology

## 2014-09-11 DIAGNOSIS — C480 Malignant neoplasm of retroperitoneum: Secondary | ICD-10-CM | POA: Diagnosis not present

## 2014-09-12 ENCOUNTER — Inpatient Hospital Stay (HOSPITAL_COMMUNITY): Admission: RE | Admit: 2014-09-12 | Payer: Medicare Other | Source: Ambulatory Visit

## 2014-09-12 ENCOUNTER — Ambulatory Visit
Admission: RE | Admit: 2014-09-12 | Discharge: 2014-09-12 | Disposition: A | Discharge status: Home / Self Care | Payer: Medicare Other | Source: Ambulatory Visit | Attending: Radiation Oncology | Admitting: Radiation Oncology

## 2014-09-12 DIAGNOSIS — C480 Malignant neoplasm of retroperitoneum: Secondary | ICD-10-CM | POA: Diagnosis not present

## 2014-09-13 ENCOUNTER — Ambulatory Visit
Admission: RE | Admit: 2014-09-13 | Discharge: 2014-09-13 | Disposition: A | Discharge status: Home / Self Care | Payer: Medicare Other | Source: Ambulatory Visit | Attending: Radiation Oncology | Admitting: Radiation Oncology

## 2014-09-13 DIAGNOSIS — C480 Malignant neoplasm of retroperitoneum: Secondary | ICD-10-CM | POA: Diagnosis not present

## 2014-09-14 ENCOUNTER — Ambulatory Visit
Admission: RE | Admit: 2014-09-14 | Discharge: 2014-09-14 | Disposition: A | Discharge status: Home / Self Care | Payer: Medicare Other | Source: Ambulatory Visit | Attending: Radiation Oncology | Admitting: Radiation Oncology

## 2014-09-14 DIAGNOSIS — C480 Malignant neoplasm of retroperitoneum: Secondary | ICD-10-CM | POA: Diagnosis not present

## 2014-09-15 ENCOUNTER — Ambulatory Visit
Admission: RE | Admit: 2014-09-15 | Discharge: 2014-09-15 | Disposition: A | Discharge status: Home / Self Care | Payer: Medicare Other | Source: Ambulatory Visit | Attending: Radiation Oncology | Admitting: Radiation Oncology

## 2014-09-15 ENCOUNTER — Encounter: Payer: Self-pay | Admitting: Radiation Oncology

## 2014-09-15 VITALS — BP 106/71 | HR 109 | Resp 16 | Wt 176.3 lb

## 2014-09-15 DIAGNOSIS — C480 Malignant neoplasm of retroperitoneum: Secondary | ICD-10-CM | POA: Diagnosis not present

## 2014-09-15 NOTE — Progress Notes (Signed)
  Radiation Oncology         (336) [phone] ________________________________  Name: Darryl Griffith MRN: [id number]  Date: 09/15/2014  DOB: 30-Dec-1945     Weekly Radiation Therapy Management    ICD-9-CM ICD-10-CM   1. 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 . . . . . . . The patient presents for routine under treatment assessment. Vitals stable. Denies pain. Reports tightness at abdominal incision. Left flank drain remains present. Surgeon has improved the patient the fluid collecting in his left flank drain is most likely lymph that will not stop until radiation is complete. Reports nausea continues but is tolerable with zofran and reglan. Reports managing constipation successfully with Miralax. Reports drinking two ensures and one boost per day. Reports fatigue. Denies skin changes to abdominal area. Denies using radiaplex as directed.  Set-up films were reviewed. The chart was checked.  Physical Findings:  weight is 176 lb 4.8 oz (79.969 kg). His blood pressure is 106/71 and his pulse is 109. His respiration is 16. . Weight has declined over the last few visits. Wt Readings from Last 3 Encounters:  09/15/14  176 lb 4.8 oz (79.969 kg)  09/07/14  177 lb 12.8 oz (80.65 kg)  09/01/14  179 lb 1.6 oz (81.239 kg)   Impression: The patient is tolerating radiation.  Plan: Continue treatment as planned.  This document serves as a record of services personally performed by Tyler Pita, MD. It was created on his behalf by Darcus Austin, a trained medical scribe. The creation of this record is based on the scribe's personal observations and the provider's statements to them. This document has been checked and approved by the attending provider.     ________________________________  [name], M.D.

## 2014-09-15 NOTE — Progress Notes (Signed)
Weight and vitals stable. Denies pain. Reports tightness at abdominal incision. Left flank drain remains present. Surgeon has improved the patient the fluid collecting in his left flank drain is most likely lymph that will not stop until radiation is complete. Reports nausea continues but is tolerable with zofran and reglan. Reports managing constipation successfully with Miralax. Reports drinking two ensures and one boost per day. Reports fatigue. Denies skin changes to abdominal area. Denies using radiaplex as directed.   BP 106/71 mmHg  Pulse 109  Resp 16  Wt 176 lb 4.8 oz (79.969 kg) Wt Readings from Last 3 Encounters:  09/15/14 176 lb 4.8 oz (79.969 kg)  09/07/14 177 lb 12.8 oz (80.65 kg)  09/01/14 179 lb 1.6 oz (81.239 kg)

## 2014-09-18 ENCOUNTER — Encounter (HOSPITAL_COMMUNITY): Payer: Self-pay | Admitting: Internal Medicine

## 2014-09-18 ENCOUNTER — Inpatient Hospital Stay (HOSPITAL_COMMUNITY)
Admission: AD | Admit: 2014-09-18 | Discharge: 2014-09-22 | DRG: 378 | Disposition: A | Discharge status: Home / Self Care | Payer: Medicare Other | Source: Ambulatory Visit | Attending: Internal Medicine | Admitting: Internal Medicine

## 2014-09-18 ENCOUNTER — Ambulatory Visit
Admission: RE | Admit: 2014-09-18 | Discharge: 2014-09-18 | Disposition: A | Discharge status: Home / Self Care | Payer: Medicare Other | Source: Ambulatory Visit | Attending: Radiation Oncology | Admitting: Radiation Oncology

## 2014-09-18 ENCOUNTER — Ambulatory Visit: Admission: RE | Admit: 2014-09-18 | Payer: Medicare Other | Source: Ambulatory Visit

## 2014-09-18 ENCOUNTER — Encounter: Payer: Self-pay | Admitting: Radiation Oncology

## 2014-09-18 ENCOUNTER — Encounter: Payer: Medicare Other | Admitting: Nutrition

## 2014-09-18 ENCOUNTER — Telehealth: Payer: Self-pay | Admitting: Radiation Oncology

## 2014-09-18 VITALS — BP 110/61 | HR 105 | Temp 98.8°F | Resp 18 | Wt 176.7 lb

## 2014-09-18 DIAGNOSIS — Z7982 Long term (current) use of aspirin: Secondary | ICD-10-CM

## 2014-09-18 DIAGNOSIS — E860 Dehydration: Secondary | ICD-10-CM

## 2014-09-18 DIAGNOSIS — R739 Hyperglycemia, unspecified: Secondary | ICD-10-CM | POA: Diagnosis present

## 2014-09-18 DIAGNOSIS — D62 Acute posthemorrhagic anemia: Secondary | ICD-10-CM | POA: Diagnosis present

## 2014-09-18 DIAGNOSIS — I10 Essential (primary) hypertension: Secondary | ICD-10-CM | POA: Diagnosis present

## 2014-09-18 DIAGNOSIS — E871 Hypo-osmolality and hyponatremia: Secondary | ICD-10-CM | POA: Diagnosis present

## 2014-09-18 DIAGNOSIS — K2960 Other gastritis without bleeding: Secondary | ICD-10-CM | POA: Diagnosis present

## 2014-09-18 DIAGNOSIS — C480 Malignant neoplasm of retroperitoneum: Secondary | ICD-10-CM | POA: Diagnosis present

## 2014-09-18 DIAGNOSIS — E43 Unspecified severe protein-calorie malnutrition: Secondary | ICD-10-CM | POA: Diagnosis not present

## 2014-09-18 DIAGNOSIS — E780 Pure hypercholesterolemia: Secondary | ICD-10-CM | POA: Diagnosis present

## 2014-09-18 DIAGNOSIS — E8809 Other disorders of plasma-protein metabolism, not elsewhere classified: Secondary | ICD-10-CM | POA: Diagnosis present

## 2014-09-18 DIAGNOSIS — K648 Other hemorrhoids: Secondary | ICD-10-CM | POA: Diagnosis present

## 2014-09-18 DIAGNOSIS — Z6827 Body mass index (BMI) 27.0-27.9, adult: Secondary | ICD-10-CM | POA: Diagnosis not present

## 2014-09-18 DIAGNOSIS — K254 Chronic or unspecified gastric ulcer with hemorrhage: Principal | ICD-10-CM | POA: Diagnosis present

## 2014-09-18 DIAGNOSIS — K921 Melena: Secondary | ICD-10-CM | POA: Diagnosis present

## 2014-09-18 DIAGNOSIS — Z8589 Personal history of malignant neoplasm of other organs and systems: Secondary | ICD-10-CM

## 2014-09-18 DIAGNOSIS — D638 Anemia in other chronic diseases classified elsewhere: Secondary | ICD-10-CM

## 2014-09-18 DIAGNOSIS — K5730 Diverticulosis of large intestine without perforation or abscess without bleeding: Secondary | ICD-10-CM | POA: Diagnosis present

## 2014-09-18 DIAGNOSIS — R188 Other ascites: Secondary | ICD-10-CM | POA: Diagnosis present

## 2014-09-18 HISTORY — DX: Acute posthemorrhagic anemia: D62

## 2014-09-18 HISTORY — DX: Unspecified intestinal obstruction, unspecified as to partial versus complete obstruction: K56.609

## 2014-09-18 HISTORY — DX: Malignant neoplasm of retroperitoneum: C48.0

## 2014-09-18 HISTORY — DX: Other ascites: R18.8

## 2014-09-18 LAB — COMPREHENSIVE METABOLIC PANEL (CC13)
ALT: 12 U/L (ref 0–55)
AST: 17 U/L (ref 5–34)
Albumin: 2.4 g/dL — ABNORMAL LOW (ref 3.5–5.0)
Alkaline Phosphatase: 65 U/L (ref 40–150)
Anion Gap: 6 mEq/L (ref 3–11)
BUN: 38.6 mg/dL — ABNORMAL HIGH (ref 7.0–26.0)
CO2: 26 mEq/L (ref 22–29)
CREATININE: 1 mg/dL (ref 0.7–1.3)
Calcium: 8.8 mg/dL (ref 8.4–10.4)
Chloride: 98 mEq/L (ref 98–109)
EGFR: 72 mL/min/{1.73_m2} — ABNORMAL LOW (ref >90)
Glucose: 159 mg/dl — ABNORMAL HIGH (ref 70–140)
POTASSIUM: 4.4 meq/L (ref 3.5–5.1)
SODIUM: 131 meq/L — AB (ref 136–145)
TOTAL PROTEIN: 5.3 g/dL — AB (ref 6.4–8.3)

## 2014-09-18 LAB — CBC WITH DIFFERENTIAL/PLATELET
BASO%: 0.3 % (ref 0.0–2.0)
Basophils Absolute: 0 10*3/uL (ref 0.0–0.1)
EOS%: 1.6 % (ref 0.0–7.0)
Eosinophils Absolute: 0.1 10*3/uL (ref 0.0–0.5)
HEMATOCRIT: 20.4 % — AB (ref 38.4–49.9)
HGB: 6.7 g/dL — CL (ref 13.0–17.1)
LYMPH#: 0.3 10*3/uL — AB (ref 0.9–3.3)
LYMPH%: 5 % — AB (ref 14.0–49.0)
MCH: 27.6 pg (ref 27.2–33.4)
MCHC: 32.8 g/dL (ref 32.0–36.0)
MCV: 84 fL (ref 79.3–98.0)
MONO#: 0.4 10*3/uL (ref 0.1–0.9)
MONO%: 6.2 % (ref 0.0–14.0)
NEUT#: 5.6 10*3/uL (ref 1.5–6.5)
NEUT%: 86.9 % — AB (ref 39.0–75.0)
Platelets: 301 10*3/uL (ref 140–400)
RBC: 2.43 10*6/uL — ABNORMAL LOW (ref 4.20–5.82)
RDW: 16.2 % — ABNORMAL HIGH (ref 11.0–14.6)
WBC: 6.4 10*3/uL (ref 4.0–10.3)

## 2014-09-18 LAB — GLUCOSE, CAPILLARY: Glucose-Capillary: 146 mg/dL — ABNORMAL HIGH (ref 65–99)

## 2014-09-18 LAB — APTT: APTT: 30 s (ref 24–37)

## 2014-09-18 LAB — HEMOGLOBIN AND HEMATOCRIT, BLOOD
HCT: 23.8 % — ABNORMAL LOW (ref 39.0–52.0)
HEMOGLOBIN: 8 g/dL — AB (ref 13.0–17.0)

## 2014-09-18 LAB — PROTIME-INR
INR: 1.06 (ref 0.00–1.49)
PROTHROMBIN TIME: 14 s (ref 11.6–15.2)

## 2014-09-18 LAB — TECHNOLOGIST REVIEW

## 2014-09-18 MED ORDER — PANTOPRAZOLE SODIUM 40 MG IV SOLR
40.0000 mg | Freq: Two times a day (BID) | INTRAVENOUS | Status: DC
  Administered 2014-09-18 – 2014-09-22 (×9): 40 mg via INTRAVENOUS
  Filled 2014-09-18 (×9): qty 40

## 2014-09-18 MED ORDER — SODIUM CHLORIDE 0.9 % IV SOLN
INTRAVENOUS | Status: DC
  Administered 2014-09-18 – 2014-09-22 (×7): via INTRAVENOUS

## 2014-09-18 MED ORDER — ONDANSETRON HCL 4 MG/2ML IJ SOLN
4.0000 mg | Freq: Four times a day (QID) | INTRAMUSCULAR | Status: DC | PRN
  Administered 2014-09-18 – 2014-09-20 (×5): 4 mg via INTRAVENOUS
  Filled 2014-09-18 (×6): qty 2

## 2014-09-18 MED ORDER — ACETAMINOPHEN 325 MG PO TABS: 650.0000 mg | ORAL_TABLET | ORAL | Status: DC | PRN

## 2014-09-18 MED ORDER — SODIUM CHLORIDE 0.9 % IV SOLN: INTRAVENOUS | Status: DC

## 2014-09-18 MED ORDER — MORPHINE SULFATE 2 MG/ML IJ SOLN: 2.0000 mg | INTRAMUSCULAR | Status: DC | PRN

## 2014-09-18 MED ORDER — TRAZODONE HCL 50 MG PO TABS
50.0000 mg | ORAL_TABLET | Freq: Every evening | ORAL | Status: DC | PRN
  Administered 2014-09-18 – 2014-09-22 (×4): 50 mg via ORAL
  Filled 2014-09-18 (×4): qty 1

## 2014-09-18 MED ORDER — SODIUM CHLORIDE 0.9 % IV SOLN
Freq: Once | INTRAVENOUS | Status: AC
  Administered 2014-09-18: 16:00:00 via INTRAVENOUS

## 2014-09-18 NOTE — H&P (Signed)
History and Physical:    Darryl Griffith   XAJ:[id number] DOB: November 30, 1945 DOA: 09/18/2014  Referring physician: Dr. Ledon Snare PCP: Gennette Pac, MD  GI: Dr. Michail Sermon  Chief Complaint: Black stools  History of Present Illness:   Darryl Griffith is an 69 y.o. 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 Tammi Klippel for adjuvant radiation, with completion of 4.5/6 weeks of treatment, treatment course complicated by retroperitoneal fluid collection s/p retroperitoneal drain (cultures negative), who wad referred as a direct admission by Dr. Tammi Klippel after he presented to the cancer center for his radiation treatment, and was found to be weak and pale, complaining of a  24 hour history of dark stools, but no associated diarrhea.  He reports that has been taking MiraLAX daily.  He has also noted dizziness with standing 2 days ago, and that his heart races with activity.  He has been fatigued since beginning XRT.  Takes aspirin daily and took a baby aspirin yesterday, but denies significant NSAID use.  Upon initial evaluation by Dr. Tammi Klippel, the patient's hemoglobin was found to be 6.7 mg/dL.  His last hemoglobin was 10.3 mg/dL on 08/01/14.  The patient did have postoperative hemorrhagic anemia after his surgery in April, requiring blood transfusion, but his hemoglobin had been stable since that time.  ROS:   Constitutional: No fever, no chills;  Appetite diminished since April; + 25 lb weight loss since April, no weight gain, + fatigue.  HEENT: No blurry vision, no diplopia, no pharyngitis, no dysphagia CV: No chest pain, + palpitations, no PND, no orthopnea, no edema.  Resp: No SOB, no cough, no pleuritic pain. GI: + nausea, no vomiting, no diarrhea, + melena, no hematochezia, + constipation, no abdominal pain but has abdominal pressure from "gas".  GU: No dysuria, no hematuria, no frequency, no urgency, +decreased urine  output. MSK: no myalgias, no arthralgias.  Neuro:  No headache, no focal neurological deficits, no history of seizures.  Psych: + depression, no anxiety.  Endo: No heat intolerance, + cold intolerance, no polyuria, no polydipsia  Skin: No rashes, no skin lesions.  Heme: No easy bruising.  Travel history: No recent travel except to [facility].   Past Medical History:   Past Medical History  Diagnosis Date  . Hypertension   . Elevated cholesterol   . Anginal pain     hx of CP saw Dr Wynonia Lawman 09/20/2013 H&P per chart   . Left Retroperitoneal mass s/p resection/L nephrectomy 07/13/2014   . Dedifferentiated liposarcoma   . 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 Postoperative anemia due to acute blood loss 07/15/2014  . SBO (small bowel obstruction) 08/01/2014  . Retroperitoneal fluid collection   . Protein-calorie malnutrition, severe 08/02/2014  . Thrombocytopenia 07/15/2014    Past Surgical History:   Past Surgical History  Procedure Laterality Date  . Surgery for arm fracture Right 1952  . Knee arthroscopy Right 1970's  . Colonoscopy with propofol N/A 10/20/2013    Procedure: COLONOSCOPY WITH PROPOFOL;  Surgeon: Lear Ng, MD;  Location: [facility];  Service: Endoscopy;  Laterality: N/A;  . Hot hemostasis N/A 10/20/2013    Procedure: HOT HEMOSTASIS (ARGON PLASMA COAGULATION/BICAP);  Surgeon: Lear Ng, MD;  Location: [facility];  Service: Endoscopy;  Laterality: N/A;  . Tigger finger release      per H&P per Dr Wynonia Lawman 09/20/2013 (  on chart)  . Resection of retroperitoneal mass Left 07/13/2014    Procedure: RESECTION OF left RETROPERITONEAL MASS;  Surgeon: Stark Klein, MD;  Location: [facility];  Service: General;  Laterality: Left;    Social History:   History   Social History  . Marital Status: Married    Spouse Name: [name]  . Number of Children: 4  . Years of Education: N/A   Occupational  History  . Retired Armed forces operational officer    Social History Main Topics  . Smoking status: Never Smoker   . Smokeless tobacco: Never Used  . Alcohol Use: Yes     Comment: 2-3 beers per night  . Drug Use: No  . Sexual Activity: Not Currently   Other Topics Concern  . Not on file   Social History Narrative   Married.  Lives in [place] with wife.    Family history:   Family History  Problem Relation Age of Onset  . Adopted: Yes  . Family history unknown: Yes    Allergies   Review of patient's allergies indicates no known allergies.  Current Medications:   Prior to Admission medications   Medication Sig Start Date End Date Taking? Authorizing Provider  aspirin EC 81 MG tablet Take 81 mg by mouth at bedtime.    Historical Provider, MD  Coenzyme Q10 (COQ10) 100 MG CAPS Take 100-200 mg by mouth 2 (two) times daily. Take 1 tablet (100 mg) every morning and 2 tablets (200 mg) every night    Historical Provider, MD  esomeprazole (NEXIUM) 40 MG capsule Take 40 mg by mouth daily. 08/09/14   Historical Provider, MD  fexofenadine (ALLEGRA) 180 MG tablet Take 180 mg by mouth daily as needed for allergies.     Historical Provider, MD  Ginkgo Biloba (GNP GINGKO BILOBA EXTRACT PO) Take 120 mg by mouth 2 (two) times daily.    Historical Provider, MD  ibuprofen (ADVIL,MOTRIN) 200 MG tablet Take 400-600 mg by mouth every 6 (six) hours as needed for headache or moderate pain.    Historical Provider, MD  losartan-hydrochlorothiazide (HYZAAR) 100-25 MG per tablet Take 1 tablet by mouth every morning.     Historical Provider, MD  metoCLOPramide (REGLAN) 10 MG/10ML SOLN Take 10 mLs (10 mg total) by mouth 4 (four) times daily -  before meals and at bedtime. 08/21/14   Tyler Pita, MD  Multiple Minerals-Vitamins (CALCIUM CITRATE PLUS/MAGNESIUM) TABS Take 1 tablet by mouth every morning. Calcium citrate 500 mg, magnesium 800 mg, zinc 10 mg    Historical Provider, MD  Multiple Vitamin (MULTIVITAMIN WITH  MINERALS) TABS tablet Take 1 tablet by mouth every morning.     Historical Provider, MD  mupirocin ointment (BACTROBAN) 2 %  08/09/14   Historical Provider, MD  Omega-3 Fatty Acids (FISH OIL) 1200 MG CAPS Take 1,200 mg by mouth 2 (two) times daily.     Historical Provider, MD  ondansetron (ZOFRAN-ODT) 8 MG disintegrating tablet Take 1 tablet (8 mg total) by mouth every 8 (eight) hours as needed for nausea or vomiting (Take one 30 min before radiation treatment). 08/16/14   Tyler Pita, MD  OVER THE COUNTER MEDICATION Take 1 tablet by mouth at bedtime. Beta Sitosterol 375 mg    Historical Provider, MD  OVER THE COUNTER MEDICATION Take 400-800 mg by mouth 2 (two) times daily. Bio Astin: 2 tablets (800 mg) every morning and 1 tablet (400 mg) every night    Historical Provider, MD  oxyCODONE-acetaminophen (PERCOCET/ROXICET) 5-325 MG per tablet Take  1-2 tablets by mouth every 6 (six) hours as needed for severe pain. 08/04/14   Stark Klein, MD  prochlorperazine (COMPAZINE) 10 MG tablet Take 1 tablet (10 mg total) by mouth every 6 (six) hours as needed for nausea or vomiting. Do not take Reglan at same time 08/25/14   Tyler Pita, MD  simvastatin (ZOCOR) 40 MG tablet Take 40 mg by mouth at bedtime.     Historical Provider, MD  sulfamethoxazole-trimethoprim (BACTRIM,SEPTRA) 400-80 MG per tablet Take 1 tablet by mouth 2 (two) times daily.    Historical Provider, MD  traZODone (DESYREL) 50 MG tablet Take 50 mg by mouth at bedtime.     Historical Provider, MD  vitamin C (ASCORBIC ACID) 500 MG tablet Take 500 mg by mouth every morning.     Historical Provider, MD  vitamin E 400 UNIT capsule Take 400 Units by mouth every morning.     Historical Provider, MD    Physical Exam:   Filed Vitals:   09/18/14 1142  BP: 130/68  Pulse: 100  Temp: 98.3 F (36.8 C)  TempSrc: Oral  Resp: 20  Height: 5\' 8"  (1.727 m)  Weight: 81.012 kg (178 lb 9.6 oz)  SpO2: 100%     Physical Exam: Blood pressure 130/68,  pulse 100, temperature 98.3 F (36.8 C), temperature source Oral, resp. rate 20, height 5\' 8"  (1.727 m), weight 81.012 kg (178 lb 9.6 oz), SpO2 100 %. Gen: No acute distress. Head: Normocephalic, atraumatic. Eyes: PERRL, EOMI, sclerae nonicteric. Mouth: Oropharynx clear. Neck: Supple, no thyromegaly, no lymphadenopathy, no jugular venous distention. Chest: Lungs CTAB. CV: Heart sounds are regular.  No M/R/G. Abdomen: Soft, nontender, nondistended with normal active bowel sounds. Back: Drain in place, tan secretions in bag. Extremities: Extremities are without C/E/C. Skin: Warm and dry. Neuro: Alert and oriented times 3; Grossly non-focal. Psych: Mood and affect normal.   Data Review:    Labs: Basic Metabolic Panel:  Recent Labs Lab 09/18/14 0959  NA 131*  K 4.4  CO2 26  GLUCOSE 159*  BUN 38.6*  CREATININE 1.0  CALCIUM 8.8   Liver Function Tests:  Recent Labs Lab 09/18/14 0959  AST 17  ALT 12  ALKPHOS 65  BILITOT <0.20  PROT 5.3*  ALBUMIN 2.4*   CBC:  Recent Labs Lab 09/18/14 1000  WBC 6.4  NEUTROABS 5.6  HGB 6.7*  HCT 20.4*  MCV 84.0  PLT 301    Radiographic Studies: No results found.    Assessment/Plan:   Principal Problem:   Melena / Acute post hemorrhagic anemia - Likely upper GIB given ASA use.   - GI consultation requested. - Give 2 units of PRBCs. - Start Protonix 40 mg IV Q 12 hours.  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  Dr. Tammi Klippel aware of patient's admission.    Hypertension - Hold anti-hypertensives for now.    Retroperitoneal fluid collection - Drain in place.    Protein-calorie malnutrition, severe / hypoalbuminemia - Advance diet when able.  Dietician consult.    Hyponatremia - Likely from dehydration.  Start IVF.    Hyperglycemia - Non-fasting sample.  Check a.m. CBG.    DVT prophylaxis - SCDs  only.  Code Status: Full. Family Communication: [name] (wife): [phone] or [phone] (cell), updated at the bedside. Disposition Plan: Home when stable, likely 2-3 days.  Time spent: 1 hour.  Quitman Norberto [facility] Pager [phone] Cell:  [phone]   If 7PM-7AM, please contact night-coverage [email or web address] Password [facility] 09/18/2014, 12:28 PM

## 2014-09-18 NOTE — Progress Notes (Signed)
Tomo down. Patient presented to the clinic requesting to be seen. Entered examine room to find patient supine on exam table. Skin pale. Patient reports he was too weak to stand for a weight. Also, patient reports he is so weak and his gait so unsteady his wife had to push him down in a wheelchair for evaluation in the rad onc clinic. Patient explains that Saturday around 0400 he felt pain in his right ribcage and right shoulder. Also, at the same time he began to feel dizzy. Reports his wife drove him to the chiropractor for an adjustment. Reports the adjustment did not help resolve any symptoms. Reports around this same time he noted his gait to be staggering. Reports his daughter is a Engineer, mining and she suggested he may be dehydrated. The patient reports he began to force fluids but, hasn't noted any relief of his symptoms. Reports he doesn't feel as though he is voiding as frequently as he should be for all the fluids he is taking in. Refuses orthostatics vitals because he feels so dizzy. Reports passing black stools. Questions if the reglan could be causing his dizziness thus he decided to hold his morning dose. Reports driving to the clinic his wife had to pull over because he thought he was going to vomit but, he just dry heaved. Denies headache, ringing in the ears or vision changes.

## 2014-09-18 NOTE — Addendum Note (Signed)
Encounter addended by: Heywood Footman, RN on: 09/18/2014 11:32 AM<BR>     Documentation filed: Notes Section, Vitals Section

## 2014-09-18 NOTE — Telephone Encounter (Signed)
Received email message from therapist on L4 that the patient is requesting a call from this RN. Phoned patient at home immediately. Patient reports staggering gait and dizziness. Requested patient present to rad onc clinic for evaluation.

## 2014-09-18 NOTE — Progress Notes (Signed)
Received a call from [facility] lab. She reports a panic hemoglobin of 6.7. Dr. Tammi Klippel immediately informed. Working to have patient admitted. Radiation treatment for today has been cancelled along with nutrition consult. Patient resting supine in exam 4 on examine table.

## 2014-09-18 NOTE — Progress Notes (Signed)
Radiation Oncology         [phone] ________________________________  Name: Darryl Griffith MRN: [id number]  Date: 09/18/2014  DOB: February 15, 1946     Weekly Radiation Therapy Management    ICD-9-CM ICD-10-CM   1. Dehydration 276.51 E86.0 CBC with Differential     CANCELED: Comprehensive metabolic panel     CANCELED: Comprehensive metabolic panel    Current Dose: 46 Gy     Planned Dose:  50 Gy  Narrative . . . . . . . . Patient presented to the clinic requesting to be seen. Entered examine room to find patient supine on exam table. Skin pale. Patient reports he was too weak to stand for a weight. Also, patient reports he is so weak and his gait so unsteady his wife had to push him down in a wheelchair for evaluation in the rad onc clinic. Patient explains that Saturday around 0400 he felt pain in his right ribcage and right shoulder. Also, at the same time he began to feel dizzy. Reports his wife drove him to the chiropractor for an adjustment. Reports around this same time he noted his gait to be staggering. Reports his daughter is a Engineer, mining and she suggested he may be dehydrated. The patient reports he began to force fluids but, hasn't noted any relief of his symptoms. Reports he doesn't feel as though he is voiding as frequently as he should be for all the fluids he is taking in. Reports that his drain bag was coming out 'lumpy' but it is now back to being liquid. Refuses orthostatics vitals because he feels so dizzy. Reports passing black stools, this occurred once. Questions if the reglan could be causing his dizziness thus he decided to hold his morning dose. Reports driving to the clinic his wife had to pull over because he thought he was going to vomit but, he just dry heaved. Denies headache, ringing in the ears or vision changes.   Set-up films were reviewed. The chart was checked.  Physical Findings:  oral temperature is 98.8 F (37.1 C). His blood pressure is 110/61 and his  pulse is 105. His respiration is 18 and oxygen saturation is 100%. .    Abdomen soft without guarding or tenderness. Wt Readings from Last 3 Encounters:  09/15/14  176 lb 4.8 oz (79.969 kg)  09/07/14  177 lb 12.8 oz (80.65 kg)  09/01/14  179 lb 1.6 oz (81.239 kg)  Results for Darryl Griffith (MRN [id number]) as of 09/18/2014 11:07  09/18/2014 10:00  WBC 6.4  RBC 2.43 (L)  Hemoglobin 6.7 (LL)  HCT 20.4 (L)  MCV 84.0  MCH 27.6  MCHC 32.8  RDW 16.2 (H)  Platelets 301  NEUT% 86.9 (H)  LYMPH% 5.0 (L)  MONO% 6.2  EOS% 1.6  BASO% 0.3  NEUT# 5.6  MONO# 0.4  Eosinophils Absolute 0.1  Basophils Absolute 0.0  lymph# 0.3 (L)  Technologist Review Metas and Myelocy...    Results for orders placed or performed during the hospital encounter of 09/18/14 (from the past 24 hour(s))  Comprehensive metabolic panel     Status: Abnormal   Collection Time: 09/18/14  9:59 AM  Result Value Ref Range   Sodium 131 (L) 136 - 145 mEq/L   Potassium 4.4 3.5 - 5.1 mEq/L   Chloride 98 98 - 109 mEq/L   CO2 26 22 - 29 mEq/L   Glucose 159 (H) 70 - 140 mg/dl   BUN 38.6 (H) 7.0 - 26.0 mg/dL  Creatinine 1.0 0.7 - 1.3 mg/dL   Total Bilirubin <0.20 0.20 - 1.20 mg/dL   Alkaline Phosphatase 65 40 - 150 U/L   AST 17 5 - 34 U/L   ALT 12 0 - 55 U/L   Total Protein 5.3 (L) 6.4 - 8.3 g/dL   Albumin 2.4 (L) 3.5 - 5.0 g/dL   Calcium 8.8 8.4 - 10.4 mg/dL   Anion Gap 6 3 - 11 mEq/L   EGFR 72 (L) >90 ml/min/1.73 m2   Narrative   Performed At:  [facility].               [place], [place] [id number]   Impression: The patient is tolerating radiation. Stat CBC shows profound anemia.  Patient tachycardic at risk for [facility].  Given black stools, he may have GI bleed.  Plan: Continue treatment as planned. Will admit to telemetry with Dr. Rockne Menghini for transfusion and GI consult.  This document serves as a record of services personally performed by Tyler Pita, MD. It was created on his  behalf by Arlyce Harman, a trained medical scribe. The creation of this record is based on the scribe's personal observations and the provider's statements to them. This document has been checked and approved by the attending provider.        ________________________________  [name], M.D.

## 2014-09-18 NOTE — Consult Note (Signed)
Referring Provider: Dr. Rockne Menghini Primary Care Physician:  Gennette Pac, MD Primary Gastroenterologist:  Dr. Michail Sermon  Reason for Consultation:  Anemia; GI bleed  HPI: Darryl Griffith is a 69 y.o. male being seen for a consult due to anemia and dark stools. He denies black stools or hematochezia. States starting yesterday his stools have been dark brown. Felt dizzy this past Saturday and had to hold the wall to walk yesterday. Also had palpitations. Denies chest pain or shortness of breath. Denies N/V/abdominal pain. Denies heartburn. 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 He reports a 34 pound weight loss since surgery in April. 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 Has been on daily Aspirin but denies other NSAIDs. Hgb 6.7 (10.3 in April 2016).    Past Medical History  Diagnosis Date  . Hypertension   . Elevated cholesterol   . Anginal pain     hx of CP saw Dr Wynonia Lawman 09/20/2013 H&P per chart   . Left Retroperitoneal mass s/p resection/L nephrectomy 07/13/2014   . Dedifferentiated liposarcoma   . 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 Postoperative anemia due to acute blood loss 07/15/2014  . SBO (small bowel obstruction) 08/01/2014  . Retroperitoneal fluid collection   . Protein-calorie malnutrition, severe 08/02/2014  . Thrombocytopenia 07/15/2014    Past Surgical History  Procedure Laterality Date  . Surgery for arm fracture Right 1952  . Knee arthroscopy Right 1970's  . Colonoscopy with propofol N/A 10/20/2013    Procedure: COLONOSCOPY WITH PROPOFOL;  Surgeon: Lear Ng, MD;  Location: [facility];  Service: Endoscopy;  Laterality: N/A;  . Hot hemostasis N/A 10/20/2013    Procedure: HOT HEMOSTASIS (ARGON PLASMA COAGULATION/BICAP);   Surgeon: Lear Ng, MD;  Location: [facility];  Service: Endoscopy;  Laterality: N/A;  . Tigger finger release      per H&P per Dr Wynonia Lawman 09/20/2013 (on chart)  . Resection of retroperitoneal mass Left 07/13/2014    Procedure: RESECTION OF left RETROPERITONEAL MASS;  Surgeon: Stark Klein, MD;  Location: [facility];  Service: General;  Laterality: Left;    Prior to Admission medications   Medication Sig Start Date End Date Taking? Authorizing Provider  aspirin EC 81 MG tablet Take 81 mg by mouth at bedtime.   Yes Historical Provider, MD  fexofenadine (ALLEGRA) 180 MG tablet Take 180 mg by mouth daily as needed for allergies.    Yes Historical Provider, MD  LORazepam (ATIVAN) 1 MG tablet Take 1 tablet by mouth 3 (three) times daily as needed. Anxiety/nausea 09/01/14  Yes Historical Provider, MD  metoCLOPramide (REGLAN) 10 MG/10ML SOLN Take 10 mLs (10 mg total) by mouth 4 (four) times daily -  before meals and at bedtime. 08/21/14  Yes Tyler Pita, MD  Multiple Vitamin (MULTIVITAMIN WITH MINERALS) TABS tablet Take 1 tablet by mouth every morning.    Yes Historical Provider, MD  ondansetron (ZOFRAN-ODT) 8 MG disintegrating tablet Take 1 tablet (8 mg total) by mouth every 8 (eight) hours as needed for nausea or vomiting (Take one 30 min before radiation treatment). 08/16/14  Yes Tyler Pita, MD  oxyCODONE-acetaminophen (PERCOCET/ROXICET) 5-325 MG per tablet Take 1-2 tablets by mouth every 6 (six) hours as needed for severe pain. 08/04/14  Yes Stark Klein, MD  traZODone (DESYREL) 50 MG  tablet Take 50 mg by mouth at bedtime.    Yes Historical Provider, MD  Coenzyme Q10 (COQ10) 100 MG CAPS Take 100-200 mg by mouth 2 (two) times daily. Take 1 tablet (100 mg) every morning and 2 tablets (200 mg) every night    Historical Provider, MD  losartan-hydrochlorothiazide (HYZAAR) 100-25 MG per tablet Take 1 tablet by mouth every morning.     Historical Provider, MD  simvastatin (ZOCOR) 40 MG tablet Take  40 mg by mouth at bedtime.     Historical Provider, MD    Scheduled Meds: . sodium chloride   Intravenous Once  . pantoprazole (PROTONIX) IV  40 mg Intravenous Q12H   Continuous Infusions: . sodium chloride 75 mL/hr at 09/18/14 1404   PRN Meds:.acetaminophen, morphine injection, ondansetron (ZOFRAN) IV  Allergies as of 09/18/2014 - Review Complete 09/18/2014  Allergen Reaction Noted  . Compazine [prochlorperazine] Other (See Comments) 09/18/2014    Family History  Problem Relation Age of Onset  . Adopted: Yes  . Family history unknown: Yes    History   Social History  . Marital Status: Married    Spouse Name: [name]  . Number of Children: 4  . Years of Education: N/A   Occupational History  . Retired Armed forces operational officer    Social History Main Topics  . Smoking status: Never Smoker   . Smokeless tobacco: Never Used  . Alcohol Use: Yes     Comment: 2-3 beers per night  . Drug Use: No  . Sexual Activity: Not Currently   Other Topics Concern  . Not on file   Social History Narrative   Married.  Lives in [place] with wife.    Review of Systems: All negative except as stated above in HPI.  Physical Exam: Vital signs: Filed Vitals:   09/18/14 1142  BP: 130/68  Pulse: 100  Temp: 98.3 F (36.8 C)  Resp: 20     General:   Alert,  Well-developed, well-nourished, pleasant and cooperative in NAD Head: atraumatic Eyes: pupils equal and reactive; anicteric sclera ENT: oropharynx clear Neck: supple, nontender Lungs:  Clear throughout to auscultation.   No wheezes, crackles, or rhonchi. No acute distress. Heart:  Regular rate and rhythm; no murmurs, clicks, rubs,  or gallops. Abdomen: soft, nontender, nondistended, +BS  Rectal:  Deferred Ext: pulses intact, no edema Neuro: alert, oriented Skin: no rash Psych: normal mood and affect  GI:  Lab Results:  Recent Labs  09/18/14 1000  WBC 6.4  HGB 6.7*  HCT 20.4*  PLT 301   BMET  Recent Labs   09/18/14 0959  NA 131*  K 4.4  CO2 26  GLUCOSE 159*  BUN 38.6*  CREATININE 1.0  CALCIUM 8.8   LFT  Recent Labs  09/18/14 0959  PROT 5.3*  ALBUMIN 2.4*  AST 17  ALT 12  ALKPHOS 65  BILITOT <0.20   PT/INR No results for input(s): LABPROT, INR in the last 72 hours.   Studies/Results: No results found.  Impression/Plan: 69 yo with symptomatic anemia and dark stools (denies black tarry stools) concerning for an upper GI bleed. No active bleeding. Agree with transfusion. Agree with IV PPI Q 12 hours. EGD tomorrow to further evaluate and look for peptic ulcer disease. Risks/benefits of EGD were discussed with patient and he agrees to proceed. NPO p MN. Hemoccult stool.    LOS: 0 days   [place] C.  09/18/2014, 2:06 PM  Pager [phone]  If no answer or after 5 PM call  [phone] 

## 2014-09-18 NOTE — Progress Notes (Signed)
Telephone report given to [place], [place]. Wheeled patient accompanied by his wife to room 1440 on the west side. Patient greet by Jacob Moores, RN and nurse tech. Weight of 176.7 lb obtained. Assisted patient into hospital bed. Will assess patient status tomorrow for possible radiation treatment.

## 2014-09-19 ENCOUNTER — Ambulatory Visit: Payer: Medicare Other

## 2014-09-19 ENCOUNTER — Encounter (HOSPITAL_COMMUNITY): Payer: Self-pay

## 2014-09-19 ENCOUNTER — Inpatient Hospital Stay (HOSPITAL_COMMUNITY): Payer: Medicare Other | Admitting: Anesthesiology

## 2014-09-19 ENCOUNTER — Ambulatory Visit
Admission: RE | Admit: 2014-09-19 | Discharge: 2014-09-19 | Disposition: A | Discharge status: Home / Self Care | Payer: Medicare Other | Source: Ambulatory Visit | Attending: Radiation Oncology | Admitting: Radiation Oncology

## 2014-09-19 ENCOUNTER — Encounter (HOSPITAL_COMMUNITY)
Admission: AD | Disposition: A | Discharge status: Home / Self Care | Payer: Self-pay | Source: Ambulatory Visit | Attending: Internal Medicine

## 2014-09-19 HISTORY — PX: ESOPHAGOGASTRODUODENOSCOPY (EGD) WITH PROPOFOL: SHX5813

## 2014-09-19 LAB — BASIC METABOLIC PANEL
Anion gap: 8 (ref 5–15)
BUN: 28 mg/dL — ABNORMAL HIGH (ref 6–20)
CALCIUM: 8.4 mg/dL — AB (ref 8.9–10.3)
CO2: 25 mmol/L (ref 22–32)
CREATININE: 1.2 mg/dL (ref 0.61–1.24)
Chloride: 101 mmol/L (ref 101–111)
GFR calc Af Amer: >60 mL/min (ref >60)
GFR, EST NON AFRICAN AMERICAN: 60 mL/min — AB (ref >60)
Glucose, Bld: 105 mg/dL — ABNORMAL HIGH (ref 65–99)
Potassium: 4.3 mmol/L (ref 3.5–5.1)
SODIUM: 134 mmol/L — AB (ref 135–145)

## 2014-09-19 LAB — TYPE AND SCREEN
ABO/RH(D): O NEG
Antibody Screen: NEGATIVE
Unit division: 0
Unit division: 0

## 2014-09-19 LAB — CBC
HEMATOCRIT: 23.7 % — AB (ref 39.0–52.0)
Hemoglobin: 7.9 g/dL — ABNORMAL LOW (ref 13.0–17.0)
MCH: 28.4 pg (ref 26.0–34.0)
MCHC: 33.3 g/dL (ref 30.0–36.0)
MCV: 85.3 fL (ref 78.0–100.0)
Platelets: 253 10*3/uL (ref 150–400)
RBC: 2.78 MIL/uL — ABNORMAL LOW (ref 4.22–5.81)
RDW: 15.5 % (ref 11.5–15.5)
WBC: 5.2 10*3/uL (ref 4.0–10.5)

## 2014-09-19 LAB — HEMOGLOBIN AND HEMATOCRIT, BLOOD
HEMATOCRIT: 24.2 % — AB (ref 39.0–52.0)
HEMATOCRIT: 23.2 % — AB (ref 39.0–52.0)
Hemoglobin: 8.2 g/dL — ABNORMAL LOW (ref 13.0–17.0)
Hemoglobin: 7.7 g/dL — ABNORMAL LOW (ref 13.0–17.0)

## 2014-09-19 LAB — GLUCOSE, CAPILLARY: GLUCOSE-CAPILLARY: 107 mg/dL — AB (ref 65–99)

## 2014-09-19 SURGERY — ESOPHAGOGASTRODUODENOSCOPY (EGD) WITH PROPOFOL
Anesthesia: Monitor Anesthesia Care

## 2014-09-19 MED ORDER — PROPOFOL 10 MG/ML IV BOLUS
INTRAVENOUS | Status: AC
  Filled 2014-09-19: qty 20

## 2014-09-19 MED ORDER — PROPOFOL INFUSION 10 MG/ML OPTIME
INTRAVENOUS | Status: DC | PRN
  Administered 2014-09-19: 300 ug/kg/min via INTRAVENOUS

## 2014-09-19 MED ORDER — GLYCOPYRROLATE 0.2 MG/ML IJ SOLN
INTRAMUSCULAR | Status: DC | PRN
  Administered 2014-09-19: 0.2 mg via INTRAVENOUS

## 2014-09-19 MED ORDER — LIDOCAINE HCL (CARDIAC) 20 MG/ML IV SOLN
INTRAVENOUS | Status: AC
  Filled 2014-09-19: qty 5

## 2014-09-19 MED ORDER — GLYCOPYRROLATE 0.2 MG/ML IJ SOLN
INTRAMUSCULAR | Status: AC
  Filled 2014-09-19: qty 1

## 2014-09-19 MED ORDER — LACTATED RINGERS IV SOLN
INTRAVENOUS | Status: DC | PRN
  Administered 2014-09-19: 13:00:00 via INTRAVENOUS

## 2014-09-19 MED ORDER — LACTATED RINGERS IV SOLN: INTRAVENOUS | Status: DC

## 2014-09-19 MED ORDER — LIDOCAINE HCL (CARDIAC) 20 MG/ML IV SOLN
INTRAVENOUS | Status: DC | PRN
  Administered 2014-09-19: 100 mg via INTRAVENOUS

## 2014-09-19 SURGICAL SUPPLY — 14 items

## 2014-09-19 NOTE — Anesthesia Postprocedure Evaluation (Signed)
  Anesthesia Post-op Note  Patient: Darryl Griffith  Procedure(s) Performed: Procedure(s) (LRB): ESOPHAGOGASTRODUODENOSCOPY (EGD) WITH PROPOFOL (N/A)  Patient Location: PACU  Anesthesia Type: MAC  Level of Consciousness: awake and alert   Airway and Oxygen Therapy: Patient Spontanous Breathing  Post-op Pain: mild  Post-op Assessment: Post-op Vital signs reviewed, Patient's Cardiovascular Status Stable, Respiratory Function Stable, Patent Airway and No signs of Nausea or vomiting  Last Vitals:  Filed Vitals:   09/19/14 1459  BP: 137/85  Pulse: 87  Temp:   Resp: 20    Post-op Vital Signs: stable   Complications: No apparent anesthesia complications

## 2014-09-19 NOTE — Anesthesia Preprocedure Evaluation (Addendum)
Anesthesia Evaluation  Patient identified by MRN, date of birth, ID band Patient awake    Reviewed: Allergy & Precautions, NPO status , Patient's Chart, lab work & pertinent test results  History of Anesthesia Complications Negative for: history of anesthetic complications  Airway Mallampati: II  TM Distance: >3 FB Neck ROM: Full    Dental no notable dental hx. (+) Dental Advisory Given   Pulmonary neg pulmonary ROS,  breath sounds clear to auscultation  Pulmonary exam normal       Cardiovascular hypertension, Pt. on medications Normal cardiovascular examRhythm:Regular Rate:Normal     Neuro/Psych negative neurological ROS  negative psych ROS   GI/Hepatic negative GI ROS, Neg liver ROS,   Endo/Other  negative endocrine ROS  Renal/GU negative Renal ROS  negative genitourinary   Musculoskeletal negative musculoskeletal ROS (+)   Abdominal   Peds negative pediatric ROS (+)  Hematology  (+) anemia ,   Anesthesia Other Findings   Reproductive/Obstetrics negative OB ROS                            Anesthesia Physical Anesthesia Plan  ASA: III  Anesthesia Plan: MAC   Post-op Pain Management:    Induction: Intravenous  Airway Management Planned:   Additional Equipment:   Intra-op Plan:   Post-operative Plan:   Informed Consent: I have reviewed the patients History and Physical, chart, labs and discussed the procedure including the risks, benefits and alternatives for the proposed anesthesia with the patient or authorized representative who has indicated his/her understanding and acceptance.   Dental advisory given  Plan Discussed with: CRNA  Anesthesia Plan Comments:        Anesthesia Quick Evaluation

## 2014-09-19 NOTE — Progress Notes (Signed)
Initial Nutrition Assessment  DOCUMENTATION CODES:  Not applicable, will need to reassess once physical assessment can be performed  INTERVENTION: - Will assess for need for supplements with diet advancement  NUTRITION DIAGNOSIS:  Inadequate oral intake related to inability to eat as evidenced by NPO status.  GOAL:  Patient will meet greater than or equal to 90% of their needs  MONITOR:  Diet advancement, PO intake, Weight trends, Labs  REASON FOR ASSESSMENT:  Consult Assessment of nutrition requirement/status  ASSESSMENT: 69 y.o. male being seen for a consult due to anemia and dark stools. He denies black stools or hematochezia. States starting yesterday his stools have been dark brown. Felt dizzy this past Saturday and had to hold the wall to walk yesterday. Also had palpitations. Denies chest pain or shortness of breath. Denies N/V/abdominal pain. Denies heartburn. 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 He reports a 34 pound weight loss since surgery in April.   Pt seen for consult for assessment. BMI indicates overweight status. Pt NPO for EGD today and was out of room at time of visit for same. Unable to perform physical assessment or assess intakes PTA.  Per weight hx review, pt has lost 33 lbs (16% body weight) in 2 months which is significant for time frame. Unable to state if pt meets criteria for malnutrition at this time; although it is highly likely that he does. Will monitor for this at follow-up.  Not meeting needs; will order supplement with diet advancement per pt preferences. Medications reviewed. Labs reviewed; CBGs: 107 and 146 mg/dL, BUN elevated, Ca: 8.4  Height:  Ht Readings from Last 1 Encounters:  09/18/14 5\' 8"  (1.727 m)    Weight:  Wt Readings from Last 1 Encounters:  09/18/14 178 lb 9.6 oz (81.012 kg)    Ideal Body Weight:  70 kg (kg)  Wt Readings from Last 10 Encounters:   09/18/14 178 lb 9.6 oz (81.012 kg)  09/18/14 176 lb 11.2 oz (80.151 kg)  09/15/14 176 lb 4.8 oz (79.969 kg)  09/07/14 177 lb 12.8 oz (80.65 kg)  09/01/14 179 lb 1.6 oz (81.239 kg)  08/25/14 179 lb 14.4 oz (81.602 kg)  08/17/14 183 lb 12.8 oz (83.371 kg)  08/07/14 188 lb 3.2 oz (85.367 kg)  08/01/14 191 lb 3.2 oz (86.728 kg)  07/13/14 211 lb 4 oz (95.822 kg)    BMI:  Body mass index is 27.16 kg/(m^2).  Estimated Nutritional Needs:  Kcal:  [id number]  Protein:  80-95 grams  Fluid:  2.2-2.5 L/day  Skin:  Wound (see comment) (abdominal incision)  Diet Order:  Diet NPO time specified  EDUCATION NEEDS:  No education needs identified at this time   Intake/Output Summary (Last 24 hours) at 09/19/14 1419 Last data filed at 09/19/14 1355  Gross per 24 hour  Intake 2373.25 ml  Output   1705 ml  Net 668.25 ml    Last BM:  PTA    Jarome Matin, RD, LDN Inpatient Clinical Dietitian Pager # [phone] After hours/weekend pager # [phone]

## 2014-09-19 NOTE — Interval H&P Note (Signed)
History and Physical Interval Note:  09/19/2014 1:33 PM  Darryl Griffith  has presented today for surgery, with the diagnosis of gib  The various methods of treatment have been discussed with the patient and family. After consideration of risks, benefits and other options for treatment, the patient has consented to  Procedure(s): ESOPHAGOGASTRODUODENOSCOPY (EGD) WITH PROPOFOL (N/A) as a surgical intervention .  The patient's history has been reviewed, patient examined, no change in status, stable for surgery.  I have reviewed the patient's chart and labs.  Questions were answered to the patient's satisfaction.     [place] C.

## 2014-09-19 NOTE — Progress Notes (Addendum)
[facility] Therapy Treatment Record Phone [phone]   Radiation Therapy was administered to Darryl Griffith on: 09/19/2014  12:31 PM (tx delivered at 917am) and was treatment # 24 out of a 33 planned course of treatments.

## 2014-09-19 NOTE — H&P (View-Only) (Signed)
Referring Provider: Dr. Rockne Menghini Primary Care Physician:  Gennette Pac, MD Primary Gastroenterologist:  Dr. Michail Sermon  Reason for Consultation:  Anemia; GI bleed  HPI: Darryl Griffith is a 69 y.o. male being seen for a consult due to anemia and dark stools. He denies black stools or hematochezia. States starting yesterday his stools have been dark brown. Felt dizzy this past Saturday and had to hold the wall to walk yesterday. Also had palpitations. Denies chest pain or shortness of breath. Denies N/V/abdominal pain. Denies heartburn. 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 He reports a 34 pound weight loss since surgery in April. 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 Has been on daily Aspirin but denies other NSAIDs. Hgb 6.7 (10.3 in April 2016).    Past Medical History  Diagnosis Date  . Hypertension   . Elevated cholesterol   . Anginal pain     hx of CP saw Dr Wynonia Lawman 09/20/2013 H&P per chart   . Left Retroperitoneal mass s/p resection/L nephrectomy 07/13/2014   . Dedifferentiated liposarcoma   . 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 Postoperative anemia due to acute blood loss 07/15/2014  . SBO (small bowel obstruction) 08/01/2014  . Retroperitoneal fluid collection   . Protein-calorie malnutrition, severe 08/02/2014  . Thrombocytopenia 07/15/2014    Past Surgical History  Procedure Laterality Date  . Surgery for arm fracture Right 1952  . Knee arthroscopy Right 1970's  . Colonoscopy with propofol N/A 10/20/2013    Procedure: COLONOSCOPY WITH PROPOFOL;  Surgeon: Lear Ng, MD;  Location: [facility];  Service: Endoscopy;  Laterality: N/A;  . Hot hemostasis N/A 10/20/2013    Procedure: HOT HEMOSTASIS (ARGON PLASMA COAGULATION/BICAP);   Surgeon: Lear Ng, MD;  Location: [facility];  Service: Endoscopy;  Laterality: N/A;  . Tigger finger release      per H&P per Dr Wynonia Lawman 09/20/2013 (on chart)  . Resection of retroperitoneal mass Left 07/13/2014    Procedure: RESECTION OF left RETROPERITONEAL MASS;  Surgeon: Stark Klein, MD;  Location: [facility];  Service: General;  Laterality: Left;    Prior to Admission medications   Medication Sig Start Date End Date Taking? Authorizing Provider  aspirin EC 81 MG tablet Take 81 mg by mouth at bedtime.   Yes Historical Provider, MD  fexofenadine (ALLEGRA) 180 MG tablet Take 180 mg by mouth daily as needed for allergies.    Yes Historical Provider, MD  LORazepam (ATIVAN) 1 MG tablet Take 1 tablet by mouth 3 (three) times daily as needed. Anxiety/nausea 09/01/14  Yes Historical Provider, MD  metoCLOPramide (REGLAN) 10 MG/10ML SOLN Take 10 mLs (10 mg total) by mouth 4 (four) times daily -  before meals and at bedtime. 08/21/14  Yes Tyler Pita, MD  Multiple Vitamin (MULTIVITAMIN WITH MINERALS) TABS tablet Take 1 tablet by mouth every morning.    Yes Historical Provider, MD  ondansetron (ZOFRAN-ODT) 8 MG disintegrating tablet Take 1 tablet (8 mg total) by mouth every 8 (eight) hours as needed for nausea or vomiting (Take one 30 min before radiation treatment). 08/16/14  Yes Tyler Pita, MD  oxyCODONE-acetaminophen (PERCOCET/ROXICET) 5-325 MG per tablet Take 1-2 tablets by mouth every 6 (six) hours as needed for severe pain. 08/04/14  Yes Stark Klein, MD  traZODone (DESYREL) 50 MG  tablet Take 50 mg by mouth at bedtime.    Yes Historical Provider, MD  Coenzyme Q10 (COQ10) 100 MG CAPS Take 100-200 mg by mouth 2 (two) times daily. Take 1 tablet (100 mg) every morning and 2 tablets (200 mg) every night    Historical Provider, MD  losartan-hydrochlorothiazide (HYZAAR) 100-25 MG per tablet Take 1 tablet by mouth every morning.     Historical Provider, MD  simvastatin (ZOCOR) 40 MG tablet Take  40 mg by mouth at bedtime.     Historical Provider, MD    Scheduled Meds: . sodium chloride   Intravenous Once  . pantoprazole (PROTONIX) IV  40 mg Intravenous Q12H   Continuous Infusions: . sodium chloride 75 mL/hr at 09/18/14 1404   PRN Meds:.acetaminophen, morphine injection, ondansetron (ZOFRAN) IV  Allergies as of 09/18/2014 - Review Complete 09/18/2014  Allergen Reaction Noted  . Compazine [prochlorperazine] Other (See Comments) 09/18/2014    Family History  Problem Relation Age of Onset  . Adopted: Yes  . Family history unknown: Yes    History   Social History  . Marital Status: Married    Spouse Name: [name]  . Number of Children: 4  . Years of Education: N/A   Occupational History  . Retired Armed forces operational officer    Social History Main Topics  . Smoking status: Never Smoker   . Smokeless tobacco: Never Used  . Alcohol Use: Yes     Comment: 2-3 beers per night  . Drug Use: No  . Sexual Activity: Not Currently   Other Topics Concern  . Not on file   Social History Narrative   Married.  Lives in [place] with wife.    Review of Systems: All negative except as stated above in HPI.  Physical Exam: Vital signs: Filed Vitals:   09/18/14 1142  BP: 130/68  Pulse: 100  Temp: 98.3 F (36.8 C)  Resp: 20     General:   Alert,  Well-developed, well-nourished, pleasant and cooperative in NAD Head: atraumatic Eyes: pupils equal and reactive; anicteric sclera ENT: oropharynx clear Neck: supple, nontender Lungs:  Clear throughout to auscultation.   No wheezes, crackles, or rhonchi. No acute distress. Heart:  Regular rate and rhythm; no murmurs, clicks, rubs,  or gallops. Abdomen: soft, nontender, nondistended, +BS  Rectal:  Deferred Ext: pulses intact, no edema Neuro: alert, oriented Skin: no rash Psych: normal mood and affect  GI:  Lab Results:  Recent Labs  09/18/14 1000  WBC 6.4  HGB 6.7*  HCT 20.4*  PLT 301   BMET  Recent Labs   09/18/14 0959  NA 131*  K 4.4  CO2 26  GLUCOSE 159*  BUN 38.6*  CREATININE 1.0  CALCIUM 8.8   LFT  Recent Labs  09/18/14 0959  PROT 5.3*  ALBUMIN 2.4*  AST 17  ALT 12  ALKPHOS 65  BILITOT <0.20   PT/INR No results for input(s): LABPROT, INR in the last 72 hours.   Studies/Results: No results found.  Impression/Plan: 69 yo with symptomatic anemia and dark stools (denies black tarry stools) concerning for an upper GI bleed. No active bleeding. Agree with transfusion. Agree with IV PPI Q 12 hours. EGD tomorrow to further evaluate and look for peptic ulcer disease. Risks/benefits of EGD were discussed with patient and he agrees to proceed. NPO p MN. Hemoccult stool.    LOS: 0 days   [place] C.  09/18/2014, 2:06 PM  Pager [phone]  If no answer or after 5 PM call  [phone] 

## 2014-09-19 NOTE — Brief Op Note (Signed)
Proximal stomach ulcer with bleeding stigmata without active bleeding. Will check H. Pylori serology and treat if positive. Clear liquid diet today. Ok to advance tomorrow if tolerates liquids. See endopro for details.

## 2014-09-19 NOTE — Transfer of Care (Signed)
Immediate Anesthesia Transfer of Care Note  Patient: Darryl Griffith  Procedure(s) Performed: Procedure(s): ESOPHAGOGASTRODUODENOSCOPY (EGD) WITH PROPOFOL (N/A)  Patient Location: PACU  Anesthesia Type:MAC  Level of Consciousness: awake, alert , oriented and patient cooperative  Airway & Oxygen Therapy: Patient Spontanous Breathing and Patient connected to nasal cannula oxygen  Post-op Assessment: Report given to RN, Post -op Vital signs reviewed and stable and Patient moving all extremities X 4  Post vital signs: stable  Last Vitals:  Filed Vitals:   09/19/14 1405  BP: 113/55  Pulse: 93  Temp:   Resp: 22    Complications: No apparent anesthesia complications

## 2014-09-19 NOTE — Op Note (Signed)
[facility] [place], [address]   ENDOSCOPY PROCEDURE REPORT  PATIENT: Darryl Griffith, Darryl Griffith  MR#: [id number] BIRTHDATE: November 04, 1945 , 43  yrs. old GENDER: male ENDOSCOPIST: Wilford Corner, MD REFERRED BY: PROCEDURE DATE:  2014-10-17 PROCEDURE:  EGD, diagnostic ASA CLASS:     Class III INDICATIONS:  melena and anemia. MEDICATIONS: Monitored anesthesia care and Per Anesthesia TOPICAL ANESTHETIC:  DESCRIPTION OF PROCEDURE: After the risks benefits and alternatives of the procedure were thoroughly explained, informed consent was obtained.  The Pentax Gastroscope [id number] endoscope was introduced through the mouth and advanced to the second portion of the duodenum , Without limitations.  The instrument was slowly withdrawn as the mucosa was fully examined. Estimated blood loss is zero unless otherwise noted in this procedure report.    Minimal edema at GEJ consistent with minimal esophagitis. Esophagus otherwise normal. GEJ 42 cm from the incisors. Segmental edema and mild erythema in antrum consistent with antral gastritis. Clear bilious fluid in stomach. An 8 mm cratered ulcer noted in the fundus with surrounding edema seen on retroflexion. Small amount of dark pigment at the border of the ulcer. A small ill-defined spot in the ulcer that was not bleeding. Duodenal bulb and 2nd portion of the duodenum normal in appearance.            The scope was then withdrawn from the patient and the procedure completed.  COMPLICATIONS: There were no immediate complications.  ENDOSCOPIC IMPRESSION:     Gastric ulcer (nonbleeding but bleeding stigmata seen) Antral gastritis  RECOMMENDATIONS:     Protonix IV Q 12 hours; Clear liquid diet; Supportive care   eSigned:  Wilford Corner, MD 10/17/14 2:07 PM    CC:  CPT CODES: ICD CODES:  The ICD and CPT codes recommended by this software are interpretations from the data that the clinical staff has  captured with the software.  The verification of the translation of this report to the ICD and CPT codes and modifiers is the sole responsibility of the health care institution and practicing physician where this report was generated.  [employer]. will not be held responsible for the validity of the ICD and CPT codes included on this report.  AMA assumes no liability for data contained or not contained herein. CPT is a Designer, television/film set of the [employer].  PATIENT NAME:  Darryl Griffith, Darryl Griffith MR#: [id number]

## 2014-09-19 NOTE — Addendum Note (Signed)
Addendum  created 09/19/14 1518 by Lissa Morales, CRNA   Modules edited: Anesthesia Flowsheet

## 2014-09-19 NOTE — Progress Notes (Signed)
Progress Note   Steaven Wholey RWE:[id number] DOB: 02/03/1946 DOA: 09/18/2014 PCP: Gennette Pac, MD   Brief Narrative:   Darryl Griffith is an 69 y.o. 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 Tammi Klippel for adjuvant radiation, with completion of 4.5/6 weeks of treatment, treatment course complicated by retroperitoneal fluid collection s/p retroperitoneal drain (cultures negative), who wad referred as a direct admission by Dr. Tammi Klippel 09/18/14 after he presented to the cancer center for his radiation treatment, and was found to be weak and pale, complaining of a 24 hour history of dark stools. Hemoglobin was subsequently found to be 6.7 mg/dL. GI consultation was requested. EGD planned.  Assessment/Plan:   Principal Problem:  Melena / Acute post hemorrhagic anemia - Likely upper GIB given ASA use.  - Evaluated by gastroenterologist, EGD planned for today. - Hemoglobin 7.9 status post 2 units of PRBCs. Recheck hemoglobin now and in 8 hours to ensure stability. - Continue Protonix 40 mg IV Q 12 hours.  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 Dr. Tammi Klippel aware of patient's admission.   Hypertension - Hold anti-hypertensives for now.   Retroperitoneal fluid collection - Drain in place.   Protein-calorie malnutrition, severe / hypoalbuminemia - Advance diet when able. Dietician consulted.   Hyponatremia - Improving with IV fluids.   Hyperglycemia - Non-fasting sample. CBG 146 in a.m. Check hemoglobin A1c.   DVT prophylaxis - SCDs only.  Code Status: Full. Family Communication: [name] (wife): [phone] or [phone] (cell), updated at the bedside. Disposition Plan: Home when stable, likely 1-2 days depending on EGD results.   IV Access:    Peripheral IV   Procedures  and diagnostic studies:   No results found.   Medical Consultants:    Dr. Michail Sermon, Gastroenterology  Anti-Infectives:    None.  Subjective:    Arvo Ealy feels well. Denies nausea, vomiting, further black stools. Wants to go home. Has not had a BM since admission.  Objective:    Filed Vitals:   09/18/14 1810 09/18/14 1840 09/18/14 2048 09/19/14 0600  BP: 107/60 116/63 130/61 144/71  Pulse: 101 105 101 100  Temp: 98.1 F (36.7 C) 98 F (36.7 C) 98.3 F (36.8 C) 98.1 F (36.7 C)  TempSrc: Oral Oral Oral Oral  Resp: 18 18  18   Height:      Weight:      SpO2: 100% 100% 100% 100%    Intake/Output Summary (Last 24 hours) at 09/19/14 0749 Last data filed at 09/19/14 0601  Gross per 24 hour  Intake 1873.25 ml  Output   1505 ml  Net 368.25 ml    Exam: Gen:  NAD Cardiovascular:  RRR, No M/R/G Respiratory:  Lungs CTAB Gastrointestinal:  Abdomen soft, NT/ND, + BS Extremities:  No C/E/C   Data Reviewed:    Labs: Basic Metabolic Panel:  Recent Labs Lab 09/18/14 0959 09/19/14 0446  NA 131* 134*  K 4.4 4.3  CL  --  101  CO2 26 25  GLUCOSE 159* 105*  BUN 38.6* 28*  CREATININE 1.0 1.20  CALCIUM 8.8 8.4*   GFR Estimated Creatinine Clearance: 55.4 mL/min (by C-G formula based on Cr of 1.2). Liver Function Tests:  Recent Labs Lab 09/18/14 0959  AST 17  ALT 12  ALKPHOS 65  BILITOT <0.20  PROT 5.3*  ALBUMIN 2.4*  Coagulation profile  Recent Labs Lab 09/18/14 1400  INR 1.06    CBC:  Recent Labs Lab 09/18/14 1000 09/18/14 2302 09/19/14 0446  WBC 6.4  --  5.2  NEUTROABS 5.6  --   --   HGB 6.7* 8.0* 7.9*  HCT 20.4* 23.8* 23.7*  MCV 84.0  --  85.3  PLT 301  --  253   CBG:  Recent Labs Lab 09/18/14 2137  GLUCAP 146*    Microbiology Recent Results (from the past 240 hour(s))  TECHNOLOGIST REVIEW     Status: None   Collection Time: 09/18/14 10:00 AM  Result Value Ref Range Status   Technologist Review Metas and  Myelocytes present  Final     Medications:   . pantoprazole (PROTONIX) IV  40 mg Intravenous Q12H   Continuous Infusions: . sodium chloride 75 mL/hr at 09/19/14 0637  . sodium chloride      Time spent: 35 minutes with > 50% of time discussing current diagnostic test results, clinical impression and plan of care.   LOS: 1 day   RAMA,CHRISTINA  [facility] Pager [phone]. If unable to reach me by pager, please call my cell phone at [phone].  *Please refer to [email or web address], password [email or web address] to get updated schedule on who will round on this patient, as hospitalists switch teams weekly. If 7PM-7AM, please contact night-coverage at [email or web address], password [email or web address] for any overnight needs.  09/19/2014, 7:49 AM

## 2014-09-20 ENCOUNTER — Ambulatory Visit: Payer: Medicare Other

## 2014-09-20 ENCOUNTER — Encounter (HOSPITAL_COMMUNITY): Payer: Self-pay | Admitting: Gastroenterology

## 2014-09-20 ENCOUNTER — Ambulatory Visit
Admission: RE | Admit: 2014-09-20 | Discharge: 2014-09-20 | Disposition: A | Discharge status: Home / Self Care | Payer: Medicare Other | Source: Ambulatory Visit | Attending: Radiation Oncology | Admitting: Radiation Oncology

## 2014-09-20 DIAGNOSIS — E8809 Other disorders of plasma-protein metabolism, not elsewhere classified: Secondary | ICD-10-CM

## 2014-09-20 DIAGNOSIS — R188 Other ascites: Secondary | ICD-10-CM

## 2014-09-20 DIAGNOSIS — D62 Acute posthemorrhagic anemia: Secondary | ICD-10-CM

## 2014-09-20 DIAGNOSIS — K921 Melena: Secondary | ICD-10-CM

## 2014-09-20 DIAGNOSIS — E43 Unspecified severe protein-calorie malnutrition: Secondary | ICD-10-CM

## 2014-09-20 DIAGNOSIS — C480 Malignant neoplasm of retroperitoneum: Secondary | ICD-10-CM

## 2014-09-20 LAB — H. PYLORI ANTIBODY, IGG: H Pylori IgG: <0.9 U/mL (ref 0.0–0.8)

## 2014-09-20 LAB — BASIC METABOLIC PANEL
ANION GAP: 8 (ref 5–15)
BUN: 25 mg/dL — ABNORMAL HIGH (ref 6–20)
CALCIUM: 8.2 mg/dL — AB (ref 8.9–10.3)
CO2: 25 mmol/L (ref 22–32)
Chloride: 102 mmol/L (ref 101–111)
Creatinine, Ser: 1.15 mg/dL (ref 0.61–1.24)
GFR calc Af Amer: >60 mL/min (ref >60)
GFR calc non Af Amer: >60 mL/min (ref >60)
Glucose, Bld: 105 mg/dL — ABNORMAL HIGH (ref 65–99)
Potassium: 4 mmol/L (ref 3.5–5.1)
Sodium: 135 mmol/L (ref 135–145)

## 2014-09-20 LAB — CBC
HCT: 24.2 % — ABNORMAL LOW (ref 39.0–52.0)
Hemoglobin: 8.1 g/dL — ABNORMAL LOW (ref 13.0–17.0)
MCH: 29.1 pg (ref 26.0–34.0)
MCHC: 33.5 g/dL (ref 30.0–36.0)
MCV: 87.1 fL (ref 78.0–100.0)
PLATELETS: 293 10*3/uL (ref 150–400)
RBC: 2.78 MIL/uL — ABNORMAL LOW (ref 4.22–5.81)
RDW: 16.1 % — AB (ref 11.5–15.5)
WBC: 4.8 10*3/uL (ref 4.0–10.5)

## 2014-09-20 LAB — HEMOGLOBIN A1C
Hgb A1c MFr Bld: 5.7 % — ABNORMAL HIGH (ref 4.8–5.6)
Mean Plasma Glucose: 117 mg/dL

## 2014-09-20 MED ORDER — POLYETHYLENE GLYCOL 3350 17 G PO PACK
17.0000 g | PACK | Freq: Every day | ORAL | Status: DC
Start: 2014-09-20 — End: 2014-09-22
  Administered 2014-09-20 – 2014-09-22 (×3): 17 g via ORAL
  Filled 2014-09-20 (×3): qty 1

## 2014-09-20 MED ORDER — ONDANSETRON 4 MG PO TBDP
8.0000 mg | ORAL_TABLET | Freq: Three times a day (TID) | ORAL | Status: DC | PRN
  Administered 2014-09-21: 8 mg via ORAL
  Filled 2014-09-20 (×3): qty 2

## 2014-09-20 NOTE — Progress Notes (Signed)
[facility] Therapy Treatment Record Phone [phone]   Radiation Therapy was administered to Darryl Griffith on: 09/20/2014  9:06 AM and was treatment # 25 out of a planned course of 33 treatments.

## 2014-09-20 NOTE — Progress Notes (Signed)
[facility] PROGRESS NOTE  Darryl Griffith WPV:[id number] DOB: [birth year] DOA: 09/18/2014 PCP: Gennette Pac, MD  Assessment/Plan:  Melena / Acute post hemorrhagic anemia - Likely upper GIB given ASA use.  - Evaluated by gastroenterologist, s/p EGD 6/21 with findings of prox stomach ulcer with bleeding stigmata. - Hgb has remained stable overngith - Continue Protonix 40 mg IV Q 12 hours. 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 Dr. Tammi Klippel is aware of patient's admission.   Hypertension -Blood pressure controlled  -Holding anti-hypertensives for now.   Retroperitoneal fluid collection - Drain in place.   Protein-calorie malnutrition, severe / hypoalbuminemia - Advance diet as tolerated   Hyponatremia - Resolved with IV fluids.   Hyperglycemia - Non-fasting sample.  - hemoglobin A1c of 5.7   DVT prophylaxis - SCDs only.  Code Status: Full Family Communication: Pt in room (indicate person spoken with, relationship, and if by phone, the number) Disposition Plan: Possible home 6/23   Consultants:  GI  Procedures:  EGD 6/21  Antibiotics:  none (indicate start date, and stop date if known)  HPI/Subjective: Feels well. Eager to go home  Objective: Filed Vitals:   09/19/14 1552 09/19/14 2119 09/20/14 0428 09/20/14 1500  BP: 127/67 122/61 127/60 131/67  Pulse: 90 110 97 97  Temp: 98.1 F (36.7 C) 98.7 F (37.1 C) 98.4 F (36.9 C) 98.4 F (36.9 C)  TempSrc: Oral Oral Oral Oral  Resp: 18 18 18 20   Height:      Weight:      SpO2: 100% 99% 98% 98%    Intake/Output Summary (Last 24 hours) at 09/20/14 1840 Last data filed at 09/20/14 1513  Gross per 24 hour  Intake      0 ml  Output   1105 ml  Net  -1105 ml   Filed Weights   09/18/14  1142  Weight: 81.012 kg (178 lb 9.6 oz)    Exam:   General:  Awake, in nad  Cardiovascular: regular, s1, s2  Respiratory: normal resp effort, no wheezing  Abdomen: soft,nondistended  Musculoskeletal: perfused,no clubbing   Data Reviewed: Basic Metabolic Panel:  Recent Labs Lab 09/18/14 0959 09/19/14 0446 09/20/14 0419  NA 131* 134* 135  K 4.4 4.3 4.0  CL  --  101 102  CO2 26 25 25   GLUCOSE 159* 105* 105*  BUN 38.6* 28* 25*  CREATININE 1.0 1.20 1.15  CALCIUM 8.8 8.4* 8.2*   Liver Function Tests:  Recent Labs Lab 09/18/14 0959  AST 17  ALT 12  ALKPHOS 65  BILITOT <0.20  PROT 5.3*  ALBUMIN 2.4*   No results for input(s): LIPASE, AMYLASE in the last 168 hours. No results for input(s): AMMONIA in the last 168 hours. CBC:  Recent Labs Lab 09/18/14 1000 09/18/14 2302 09/19/14 0446 09/19/14 1213 09/19/14 1959 09/20/14 0419  WBC 6.4  --  5.2  --   --  4.8  NEUTROABS 5.6  --   --   --   --   --   HGB 6.7* 8.0* 7.9* 8.2* 7.7* 8.1*  HCT 20.4* 23.8* 23.7* 24.2* 23.2* 24.2*  MCV 84.0  --  85.3  --   --  87.1  PLT 301  --  253  --   --  293   Cardiac Enzymes: No results for input(s): CKTOTAL,  CKMB, CKMBINDEX, TROPONINI in the last 168 hours. BNP (last 3 results) No results for input(s): BNP in the last 8760 hours.  ProBNP (last 3 results) No results for input(s): PROBNP in the last 8760 hours.  CBG:  Recent Labs Lab 09/18/14 2137 09/19/14 0800  GLUCAP 146* 107*    Recent Results (from the past 240 hour(s))  TECHNOLOGIST REVIEW     Status: None   Collection Time: 09/18/14 10:00 AM  Result Value Ref Range Status   Technologist Review Metas and Myelocytes present  Final     Studies: No results found.  Scheduled Meds: . pantoprazole (PROTONIX) IV  40 mg Intravenous Q12H   Continuous Infusions: . 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 If 7PM-7AM, please contact night-coverage at [email or web address], password [facility] 09/20/2014, 6:40 PM  LOS: 2 days

## 2014-09-20 NOTE — Care Management Note (Signed)
Case Management Note  Patient Details  Name: Darryl Griffith MRN: [id number] Date of Birth: [birth year]  Subjective/Objective: Pt admitted with GIB                   Action/Plan:from home, plan to return home.   Expected Discharge Date:                  Expected Discharge Plan:  Home/Self Care  In-House Referral:     Discharge planning Services  CM Consult  Post Acute Care Choice:    Choice offered to:     DME Arranged:    DME Agency:     HH Arranged:    HH Agency:     Status of Service:  In process, will continue to follow  Medicare Important Message Given:  Yes Date Medicare IM Given:  09/20/14 Medicare IM give by:  M.Emmalia Heyboer, RN Date Additional Medicare IM Given:    Additional Medicare Important Message give by:     If discussed at [facility] of Stay Meetings, dates discussed:    Additional CommentsPurcell Mouton, RN 09/20/2014, 4:16 PM

## 2014-09-20 NOTE — Progress Notes (Signed)
[facility] Progress Note  Subjective: Not in room  Objective: Vital signs in last 24 hours: Temp:  [98.1 F (36.7 C)-99.3 F (37.4 C)] 98.4 F (36.9 C) (06/22 0428) Pulse Rate:  [87-110] 97 (06/22 0428) Resp:  [18-31] 18 (06/22 0428) BP: (113-137)/(55-85) 127/60 mmHg (06/22 0428) SpO2:  [97 %-100 %] 98 % (06/22 0428) Weight change:    PE: Deferred  Lab Results: Results for orders placed or performed during the hospital encounter of 09/18/14 (from the past 24 hour(s))  Hemoglobin and hematocrit, blood     Status: Abnormal   Collection Time: 09/19/14 12:13 PM  Result Value Ref Range   Hemoglobin 8.2 (L) 13.0 - 17.0 g/dL   HCT 24.2 (L) 39.0 - 52.0 %  H. pylori antibody, IgG     Status: None   Collection Time: 09/19/14  5:00 PM  Result Value Ref Range   H Pylori IgG <0.9 0.0 - 0.8 U/mL  Hemoglobin and hematocrit, blood     Status: Abnormal   Collection Time: 09/19/14  7:59 PM  Result Value Ref Range   Hemoglobin 7.7 (L) 13.0 - 17.0 g/dL   HCT 23.2 (L) 39.0 - 52.0 %  CBC     Status: Abnormal   Collection Time: 09/20/14  4:19 AM  Result Value Ref Range   WBC 4.8 4.0 - 10.5 K/uL   RBC 2.78 (L) 4.22 - 5.81 MIL/uL   Hemoglobin 8.1 (L) 13.0 - 17.0 g/dL   HCT 24.2 (L) 39.0 - 52.0 %   MCV 87.1 78.0 - 100.0 fL   MCH 29.1 26.0 - 34.0 pg   MCHC 33.5 30.0 - 36.0 g/dL   RDW 16.1 (H) 11.5 - 15.5 %   Platelets 293 150 - 400 K/uL  Basic metabolic panel     Status: Abnormal   Collection Time: 09/20/14  4:19 AM  Result Value Ref Range   Sodium 135 135 - 145 mmol/L   Potassium 4.0 3.5 - 5.1 mmol/L   Chloride 102 101 - 111 mmol/L   CO2 25 22 - 32 mmol/L   Glucose, Bld 105 (H) 65 - 99 mg/dL   BUN 25 (H) 6 - 20 mg/dL   Creatinine, Ser 1.15 0.61 - 1.24 mg/dL   Calcium 8.2 (L) 8.9 - 10.3 mg/dL   GFR calc non Af Amer >60 >60 mL/min   GFR calc Af Amer >60 >60 mL/min   Anion gap 8 5 - 15    Studies/Results: No results found.    Assessment: GI bleed presumably from  gastric ulcer with low stigma of hemorrhage by endoscopic appearance  Plan: Continue PPI Monitor stools and hemoglobin Advance diet.    Kadasia Kassing C 09/20/2014, 9:14 AM  Pager [phone] If no answer or after 5 PM call [phone]

## 2014-09-21 ENCOUNTER — Ambulatory Visit
Admission: RE | Admit: 2014-09-21 | Discharge: 2014-09-21 | Disposition: A | Discharge status: Home / Self Care | Payer: Medicare Other | Source: Ambulatory Visit | Attending: Radiation Oncology | Admitting: Radiation Oncology

## 2014-09-21 ENCOUNTER — Ambulatory Visit: Payer: Medicare Other

## 2014-09-21 DIAGNOSIS — I10 Essential (primary) hypertension: Secondary | ICD-10-CM

## 2014-09-21 LAB — CBC
HEMATOCRIT: 22.2 % — AB (ref 39.0–52.0)
Hemoglobin: 7.3 g/dL — ABNORMAL LOW (ref 13.0–17.0)
MCH: 28.6 pg (ref 26.0–34.0)
MCHC: 32.9 g/dL (ref 30.0–36.0)
MCV: 87.1 fL (ref 78.0–100.0)
PLATELETS: 251 10*3/uL (ref 150–400)
RBC: 2.55 MIL/uL — ABNORMAL LOW (ref 4.22–5.81)
RDW: 16.6 % — AB (ref 11.5–15.5)
WBC: 2.7 10*3/uL — ABNORMAL LOW (ref 4.0–10.5)

## 2014-09-21 LAB — HEMOGLOBIN AND HEMATOCRIT, BLOOD
HCT: 21.2 % — ABNORMAL LOW (ref 39.0–52.0)
HCT: 21.1 % — ABNORMAL LOW (ref 39.0–52.0)
HEMOGLOBIN: 7.1 g/dL — AB (ref 13.0–17.0)
Hemoglobin: 7.1 g/dL — ABNORMAL LOW (ref 13.0–17.0)

## 2014-09-21 LAB — PREPARE RBC (CROSSMATCH)

## 2014-09-21 MED ORDER — ONDANSETRON 4 MG PO TBDP
8.0000 mg | ORAL_TABLET | Freq: Three times a day (TID) | ORAL | Status: DC
  Administered 2014-09-21 – 2014-09-22 (×3): 8 mg via ORAL
  Filled 2014-09-21 (×2): qty 2

## 2014-09-21 MED ORDER — SODIUM CHLORIDE 0.9 % IV SOLN
Freq: Once | INTRAVENOUS | Status: AC
  Administered 2014-09-21: 21:00:00 via INTRAVENOUS

## 2014-09-21 NOTE — Progress Notes (Signed)
[facility] Therapy Treatment Record Phone [phone]   Radiation Therapy was administered to Darryl Griffith on: 09/21/2014  8:56 AM and was treatment # 26 out of a planned course of 33 treatments.

## 2014-09-21 NOTE — Progress Notes (Signed)
[facility] PROGRESS NOTE  Kemani Demarais TIW:[id number] DOB: 16-May-1945 DOA: 09/18/2014 PCP: Gennette Pac, MD  Assessment/Plan:  Melena / Acute post hemorrhagic anemia - Suspected upper GIB - Seen by gastroenterologist, s/p EGD 6/21 with findings of prox stomach ulcer with bleeding stigmata. - Hgb to 7.3 from over 8 overnight - Continue Protonix 40 mg IV Q 12 hours. - Discussed case with GI. Recs to follow CBC and transfuse as needed. 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 Dr. Tammi Klippel is aware of patient's admission.   Hypertension -Blood pressure controlled  -Holding anti-hypertensives for now.   Retroperitoneal fluid collection - Drain in place.   Protein-calorie malnutrition, severe / hypoalbuminemia - Advance diet as tolerated   Hyponatremia - Resolved with IV fluids.   Hyperglycemia - Non-fasting sample.  - hemoglobin A1c of 5.7   DVT prophylaxis - SCDs only.  Code Status: Full Family Communication: Pt in room, wife at bedside Disposition Plan: Pending   Consultants:  GI  Procedures:  EGD 6/21  Antibiotics:  none  HPI/Subjective: Reports black stools overnight. Nausea/vomiting with food  Objective: Filed Vitals:   09/20/14 1500 09/20/14 2054 09/21/14 0501 09/21/14 1449  BP: 131/67 127/66 123/63 139/74  Pulse: 97 96 94 98  Temp: 98.4 F (36.9 C) 98.6 F (37 C) 98.3 F (36.8 C) 98.4 F (36.9 C)  TempSrc: Oral Oral Oral Oral  Resp: 20 18 18 18   Height:      Weight:      SpO2: 98% 100% 98% 99%    Intake/Output Summary (Last 24 hours) at 09/21/14 1857 Last data filed at 09/21/14 1800  Gross per 24 hour  Intake   3270 ml  Output   2330 ml  Net    940 ml   Filed Weights   09/18/14 1142  Weight: 81.012 kg (178 lb 9.6 oz)    Exam:   General:  Awake, laying in bed, in  nad  Cardiovascular: regular, s1, s2  Respiratory: normal resp effort, no wheezing  Abdomen: soft,nondistended, pos BS  Musculoskeletal: perfused,no clubbing   Data Reviewed: Basic Metabolic Panel:  Recent Labs Lab 09/18/14 0959 09/19/14 0446 09/20/14 0419  NA 131* 134* 135  K 4.4 4.3 4.0  CL  --  101 102  CO2 26 25 25   GLUCOSE 159* 105* 105*  BUN 38.6* 28* 25*  CREATININE 1.0 1.20 1.15  CALCIUM 8.8 8.4* 8.2*   Liver Function Tests:  Recent Labs Lab 09/18/14 0959  AST 17  ALT 12  ALKPHOS 65  BILITOT <0.20  PROT 5.3*  ALBUMIN 2.4*   No results for input(s): LIPASE, AMYLASE in the last 168 hours. No results for input(s): AMMONIA in the last 168 hours. CBC:  Recent Labs Lab 09/18/14 1000  09/19/14 0446  09/19/14 1959 09/20/14 0419 09/21/14 0441 09/21/14 1244 09/21/14 1714  WBC 6.4  --  5.2  --   --  4.8 2.7*  --   --   NEUTROABS 5.6  --   --   --   --   --   --   --   --   HGB 6.7*  < > 7.9*  < > 7.7* 8.1* 7.3* 7.1* 7.1*  HCT 20.4*  < > 23.7*  < > 23.2* 24.2* 22.2* 21.2* 21.1*  MCV 84.0  --  85.3  --   --  87.1 87.1  --   --   PLT 301  --  253  --   --  293 251  --   --   < > = values in this interval not displayed. Cardiac Enzymes: No results for input(s): CKTOTAL, CKMB, CKMBINDEX, TROPONINI in the last 168 hours. BNP (last 3 results) No results for input(s): BNP in the last 8760 hours.  ProBNP (last 3 results) No results for input(s): PROBNP in the last 8760 hours.  CBG:  Recent Labs Lab 09/18/14 2137 09/19/14 0800  GLUCAP 146* 107*    Recent Results (from the past 240 hour(s))  TECHNOLOGIST REVIEW     Status: None   Collection Time: 09/18/14 10:00 AM  Result Value Ref Range Status   Technologist Review Metas and Myelocytes present  Final     Studies: No results found.  Scheduled Meds: . sodium chloride   Intravenous Once  . ondansetron  8 mg Oral TID AC  . pantoprazole (PROTONIX) IV  40 mg Intravenous Q12H  . polyethylene  glycol  17 g Oral Daily   Continuous Infusions: . 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 If 7PM-7AM, please contact night-coverage at [email or web address], password [facility] 09/21/2014, 6:57 PM  LOS: 3 days

## 2014-09-21 NOTE — Progress Notes (Signed)
Received call from [place], RT that the patient is requesting to see this RN. Visited with patient in treatment area. Patient states, "I just wanted to inform you that last night was rough." He states, "the food here is horrible." He goes on to say that he was up most of the night with dry heaves and passed a black stool despite taking "only" Zofran 4 mg and consuming clear liquids. Reports this morning "after convincing them" he needs Zofran 8 mg and receiving it he was able to consume a peach BOOST breeze and a nutrition bar. Concerned his hemoglobin dropped from 7.3 to 8.1 today.

## 2014-09-22 ENCOUNTER — Ambulatory Visit
Admit: 2014-09-22 | Discharge: 2014-09-22 | Disposition: A | Discharge status: Home / Self Care | Payer: Medicare Other | Attending: Radiation Oncology | Admitting: Radiation Oncology

## 2014-09-22 ENCOUNTER — Encounter: Payer: Self-pay | Admitting: Radiation Oncology

## 2014-09-22 ENCOUNTER — Ambulatory Visit
Admission: RE | Admit: 2014-09-22 | Discharge: 2014-09-22 | Disposition: A | Discharge status: Home / Self Care | Payer: Medicare Other | Source: Ambulatory Visit | Attending: Radiation Oncology | Admitting: Radiation Oncology

## 2014-09-22 VITALS — BP 103/55 | HR 109 | Resp 16

## 2014-09-22 DIAGNOSIS — C480 Malignant neoplasm of retroperitoneum: Secondary | ICD-10-CM

## 2014-09-22 LAB — CBC
HCT: 24.7 % — ABNORMAL LOW (ref 39.0–52.0)
HEMOGLOBIN: 8.1 g/dL — AB (ref 13.0–17.0)
MCH: 28.5 pg (ref 26.0–34.0)
MCHC: 32.8 g/dL (ref 30.0–36.0)
MCV: 87 fL (ref 78.0–100.0)
Platelets: 246 10*3/uL (ref 150–400)
RBC: 2.84 MIL/uL — AB (ref 4.22–5.81)
RDW: 16.3 % — ABNORMAL HIGH (ref 11.5–15.5)
WBC: 1.7 10*3/uL — ABNORMAL LOW (ref 4.0–10.5)

## 2014-09-22 LAB — TYPE AND SCREEN
ABO/RH(D): O NEG
ANTIBODY SCREEN: NEGATIVE
Unit division: 0

## 2014-09-22 MED ORDER — PANTOPRAZOLE SODIUM 40 MG PO TBEC: 40.0000 mg | DELAYED_RELEASE_TABLET | Freq: Every day | ORAL | Status: DC

## 2014-09-22 NOTE — Progress Notes (Signed)
[facility] Therapy Treatment Record Phone [phone]   Radiation Therapy was administered to Darryl Griffith on: 09/22/2014  9:06 AM and was treatment # 27 out of a planned course of 33 treatments.

## 2014-09-22 NOTE — Discharge Summary (Signed)
Physician Discharge Summary  Darryl Griffith XVQ:[id number] DOB: [birth year] DOA: 09/18/2014  PCP: Gennette Pac, MD  Admit date: 09/18/2014 Discharge date: 09/22/2014  Time spent: 20 minutes  Recommendations for Outpatient Follow-up:  1. Follow up with PCP in 1-2 weeks 2. Repeat CBC in one week 3. Follow up with Dr. 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 Briefly, pt presented with black appearing stools. Pt was admitted for further work up.  Hospital Course:   Melena / Acute post hemorrhagic anemia - Suspected upper GIB - Seen by gastroenterologist, s/p EGD 6/21 with findings of prox stomach ulcer with bleeding stigmata. - Pt did require 2 units of prbc's on admit for hgb of 6.7 - Hgb gradually trended down to a low of 7.1, requiring another unit of prbc with appropriate correction of hgb to 8.1 - Initially continued Protonix 40 mg IV Q 12 hours. 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 Dr. Tammi Klippel was made aware of patient's admission.   Hypertension -Blood pressure was controlled  -Held anti-hypertensives for now.   Retroperitoneal fluid collection - Drain in place.   Protein-calorie malnutrition, severe / hypoalbuminemia - Encourage PO diet as  tolerated   Hyponatremia - Resolved with IV fluids.   Hyperglycemia - Non-fasting sample.  - hemoglobin A1c of 5.7   DVT prophylaxis - SCDs only while inpatient  Procedures:  EGD 6/21  Consultations:  GI  Discharge Exam: Filed Vitals:   09/21/14 2106 09/21/14 2150 09/22/14 0023 09/22/14 0710  BP: 133/70 128/66 135/72 127/67  Pulse: 87 89 87 85  Temp: 98.5 F (36.9 C) 98.4 F (36.9 C) 98.8 F (37.1 C) 98 F (36.7 C)  TempSrc: Oral Oral Oral Oral  Resp: 18 20 18 18   Height:      Weight:      SpO2: 100% 100% 100% 100%    General: awake,in nad Cardiovascular: regular, s1, s2 Respiratory: normal resp effort, no wheezing  Discharge Instructions     Medication List    STOP taking these medications        aspirin EC 81 MG tablet     CoQ10 100 MG Caps     losartan-hydrochlorothiazide 100-25 MG per tablet  Commonly known as:  HYZAAR     simvastatin 40 MG tablet  Commonly known as:  ZOCOR      TAKE these medications        fexofenadine 180 MG tablet  Commonly known as:  ALLEGRA  Take 180 mg by mouth daily as needed for allergies.     LORazepam 1 MG tablet  Commonly known as:  ATIVAN  Take 1 tablet by mouth 3 (three) times daily as needed. Anxiety/nausea     metoCLOPramide 10 MG/10ML Soln  Commonly known as:  REGLAN  Take 10 mLs (  10 mg total) by mouth 4 (four) times daily -  before meals and at bedtime.     multivitamin with minerals Tabs tablet  Take 1 tablet by mouth every morning.     ondansetron 8 MG disintegrating tablet  Commonly known as:  ZOFRAN-ODT  Take 1 tablet (8 mg total) by mouth every 8 (eight) hours as needed for nausea or vomiting (Take one 30 min before radiation treatment).     oxyCODONE-acetaminophen 5-325 MG per tablet  Commonly known as:  PERCOCET/ROXICET  Take 1-2 tablets by mouth every 6 (six) hours as needed for severe pain.     pantoprazole 40 MG tablet  Commonly known as:  PROTONIX  Take 1 tablet (40 mg total)  by mouth daily.     traZODone 50 MG tablet  Commonly known as:  DESYREL  Take 50 mg by mouth at bedtime.       Allergies  Allergen Reactions  . Compazine [Prochlorperazine] Other (See Comments)    Made pt really lethargic, and sleepy   Follow-up Information    Follow up with Halfway C., MD In 2 weeks.   Specialty:  Gastroenterology   Contact information:   [address] [place] [address] [phone]       Follow up with Gennette Pac, MD.   Specialty:  Family Medicine   Why:  Called to make an appointment at Dr. Eddie Dibbles office for CBC on Tuesday or Wednesday of next week   Contact information:   [place] [address] [phone]       Follow up with Gennette Pac, MD. Schedule an appointment as soon as possible for a visit in 1 week.   Specialty:  Family Medicine   Why:  Hospital follow up   Contact information:   [place] [address] [phone]        The results of significant diagnostics from this hospitalization (including imaging, microbiology, ancillary and laboratory) are listed below for reference.    Significant Diagnostic Studies: Ct Abdomen Pelvis Wo Contrast  08/29/2014   CLINICAL DATA:  History of left retroperitoneal liposarcoma status post resection. The patient has a persistent fluid collection in the left upper abdomen with a percutaneous drainage catheter. Patient returns for follow-up imaging and management.  EXAM: CT ABDOMEN AND PELVIS WITHOUT CONTRAST  TECHNIQUE: Multidetector CT imaging of the abdomen and pelvis was performed following the standard protocol without IV contrast.  COMPARISON:  08/15/2014  FINDINGS: Again noted is mild volume loss at the posterior left lung base. Otherwise, the lung bases are clear.  There continues to be a percutaneous drain in the posterior left upper abdomen. Patient is status post a left nephrectomy and resection of the left  retroperitoneal mass. Again noted are multiple surgical clips throughout the left upper retroperitoneum. Drainage catheter has been slightly pulled back compared to the previous CT. There are small gas collections extending medial and superior to the drainage catheter. There is no evidence for a large residual abscess or fluid collection. The previously described collection inferior to the drainage catheter has markedly decreased in size or decompressed. The fluid collection posterior to the stomach appears smaller or resolved. There is ill-defined material around the drain which probably represents a decompressed cavity. The largest amount of gas appears to be in the region of the pancreatic tail. The pancreatic tail and spleen are medially displaced compared to the pre-operative images on 05/01/2014. There is residual stranding throughout the  left upper abdomen and along the left paraspinal tissue from the previous surgery.  Normal appearance of the liver and gallbladder. There is a normal appearance of the right kidney without hydronephrosis. Normal appearance of the right adrenal gland. Small amount of fluid in the urinary bladder without gross abnormality.  Mild distention of the stomach. There is no significant small bowel dilatation. Postsurgical changes along the anterior abdominal wall. No significant free fluid or lymphadenopathy in the pelvis.  There is extensive diverticulosis involving the sigmoid colon without acute inflammation. Moderate amount of stool in the colon.  Disc space narrowing at L4-L5 and L5-S1.  No acute bone abnormality.  IMPRESSION: The percutaneous drainage catheter remains in the left upper abdomen. The previously described fluid collections in the left upper abdomen are smaller or less conspicuous than the previous CT examination. These collections are poorly defined, likely decompressed and difficult to evaluate on this non contrast examination. There continues to be multiple gas  collections medial and superior to the drainage catheter near the pancreatic tail. Findings raise concern for a fistula connection and residual abscess cavity in this region. Please refer to the drain injection from the same day.  Colonic diverticulosis without acute colonic inflammation.   Electronically Signed   By: Markus Daft M.D.   On: 08/29/2014 17:02   Ir Sinus/fist Tube Chk-non Gi  08/29/2014   CLINICAL DATA:  History of left retroperitoneal liposarcoma with postoperative abscess. Patient denies abdominal pain, fevers or chills. The patient reports approximately 50-100 mL/day of foul-smelling thick yellow drainage from the left upper abdominal drainage catheter.  EXAM: DRAIN INJECTION WITH FLUOROSCOPY  Physician: Stephan Minister. Henn, MD  FLUOROSCOPY TIME:  36 seconds, 23 mGy  MEDICATIONS AND MEDICAL HISTORY: None  ANESTHESIA/SEDATION: Moderate sedation time: None  CONTRAST:  10 mL Omnipaque-300  PROCEDURE: Patient was placed supine on the interventional table. Contrast was injected through the drainage catheter. The contrast material was easily aspirated. The catheter was flushed and attached to a gravity bag. Fluid in the gravity bag was collected and sent for an amylase level.  FINDINGS: The pigtail catheter remains in the left upper abdomen and has been minimally pulled back since the previous examination. Contrast injection demonstrates a residual collection around the drainage catheter but this collection is smaller compared to the previous drain injection. A fistula connection to bowel or the pancreatic duct is not clearly identified.  Estimated blood loss: None  COMPLICATIONS: None  IMPRESSION: Fluid/abscess cavity in the left upper abdomen is smaller compared to 08/15/2014.  The patient continues to have 50-100 mL of drainage from the catheter per day. The drainage is foul-smelling cloudy yellow material. These findings were discussed with Dr. Barry Dienes. A sample of the fluid was sent for amylase to evaluate  for a pancreatic leak or fistula.  The patient will follow-up in drain clinic in 2 weeks. The residual abscess cavity is not well demonstrated on non contrast CT. As a result, the patient will return to drain clinic for evaluation and drain injection only. Patient will also follow-up with Dr. Barry Dienes.   Electronically Signed   By: Markus Daft M.D.   On: 08/29/2014 17:10    Microbiology: Recent Results (from the past 240 hour(s))  TECHNOLOGIST REVIEW     Status: None   Collection Time: 09/18/14 10:00 AM  Result Value Ref Range Status   Technologist Review Metas and Myelocytes present  Final     Labs: Basic Metabolic Panel:  Recent Labs Lab 09/18/14 0959 09/19/14  0446 09/20/14 0419  NA 131* 134* 135  K 4.4 4.3 4.0  CL  --  101 102  CO2 26 25 25   GLUCOSE 159* 105* 105*  BUN 38.6* 28* 25*  CREATININE 1.0 1.20 1.15  CALCIUM 8.8 8.4* 8.2*   Liver Function Tests:  Recent Labs Lab 09/18/14 0959  AST 17  ALT 12  ALKPHOS 65  BILITOT <0.20  PROT 5.3*  ALBUMIN 2.4*   No results for input(s): LIPASE, AMYLASE in the last 168 hours. No results for input(s): AMMONIA in the last 168 hours. CBC:  Recent Labs Lab 09/18/14 1000  09/19/14 0446  09/20/14 0419 09/21/14 0441 09/21/14 1244 09/21/14 1714 09/22/14 0353  WBC 6.4  --  5.2  --  4.8 2.7*  --   --  1.7*  NEUTROABS 5.6  --   --   --   --   --   --   --   --   HGB 6.7*  < > 7.9*  < > 8.1* 7.3* 7.1* 7.1* 8.1*  HCT 20.4*  < > 23.7*  < > 24.2* 22.2* 21.2* 21.1* 24.7*  MCV 84.0  --  85.3  --  87.1 87.1  --   --  87.0  PLT 301  --  253  --  293 251  --   --  246  < > = values in this interval not displayed. Cardiac Enzymes: No results for input(s): CKTOTAL, CKMB, CKMBINDEX, TROPONINI in the last 168 hours. BNP: BNP (last 3 results) No results for input(s): BNP in the last 8760 hours.  ProBNP (last 3 results) No results for input(s): PROBNP in the last 8760 hours.  CBG:  Recent Labs Lab 09/18/14 2137 09/19/14 0800   GLUCAP 146* 107*    Signed:  Rosina Cressler K  [facility] 09/22/2014, 10:22 AM

## 2014-09-22 NOTE — Progress Notes (Signed)
  Radiation Oncology         [phone] ________________________________  Name: Darryl Griffith MRN: [id number]  Date: 09/22/2014  DOB: [birth year]     Weekly Radiation Therapy Management    ICD-9-CM ICD-10-CM   1. 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 . . . . . . . Patient presented to the clinic requesting to be seen. Weight and vitals stable. The pt presents to the clinic in a wheel chair and receiving IV fluid. He received blood again last night. His hemoglobin is up to 8.1 today. Denies nausea or vomiting. Reports for dinner he ate a [employer] cheeseburger and this morning for breakfast an energy bar. Scheduled for discharge today. Questions if he should resume reglan 10 and zofran 8. He had one black stool, but hasn't had any stools since.  Set-up films were reviewed. The chart was checked.  Physical Findings:  blood pressure is 103/55 and his pulse is 109. His respiration is 16.   Wt Readings from Last 3 Encounters:  09/18/14  178 lb 9.6 oz (81.012 kg)  09/18/14  176 lb 11.2 oz (80.151 kg)  09/15/14  176 lb 4.8 oz (79.969 kg)   Impression: The patient is tolerating radiation.  Plan: Continue treatment as planned. The pt should continue zofran as needed for two weeks after radiation.  This document serves as a record of services personally performed by Tyler Pita, MD. It was created on his behalf by Darcus Austin, a trained medical scribe. The creation of this record is based on the scribe's personal observations and the provider's statements to them. This document has been checked and approved by the attending provider.      ________________________________  [name], M.D.

## 2014-09-22 NOTE — Progress Notes (Signed)
Vitals stable. Received blood again last night. Hemoglobin up to 8.1 today. Denies nausea or vomiting. Reports for dinner he ate a [employer] cheeseburger and this morning for breakfast an energy bar. Scheduled for discharge today. Questions if he should resume reglan 10 and zofran 8.  BP 103/55 mmHg  Pulse 109  Resp 16 Wt Readings from Last 3 Encounters:  09/18/14 178 lb 9.6 oz (81.012 kg)  09/18/14 176 lb 11.2 oz (80.151 kg)  09/15/14 176 lb 4.8 oz (79.969 kg)

## 2014-09-22 NOTE — BH Specialist Note (Signed)
[facility] Progress Note  Subjective: The patient has no complaints. Has not had a bowel movement since Wednesday  Objective: Vital signs in last 24 hours: Temp:  [98 F (36.7 C)-98.8 F (37.1 C)] 98 F (36.7 C) (06/24 0710) Pulse Rate:  [85-98] 85 (06/24 0710) Resp:  [18-20] 18 (06/24 0710) BP: (127-139)/(66-74) 127/67 mmHg (06/24 0710) SpO2:  [99 %-100 %] 100 % (06/24 0710) Weight change:    PE: Unchanged  Lab Results: Results for orders placed or performed during the hospital encounter of 09/18/14 (from the past 24 hour(s))  Hemoglobin and hematocrit, blood     Status: Abnormal   Collection Time: 09/21/14 12:44 PM  Result Value Ref Range   Hemoglobin 7.1 (L) 13.0 - 17.0 g/dL   HCT 21.2 (L) 39.0 - 52.0 %  Hemoglobin and hematocrit, blood     Status: Abnormal   Collection Time: 09/21/14  5:14 PM  Result Value Ref Range   Hemoglobin 7.1 (L) 13.0 - 17.0 g/dL   HCT 21.1 (L) 39.0 - 52.0 %  Prepare RBC     Status: None   Collection Time: 09/21/14  6:57 PM  Result Value Ref Range   Order Confirmation ORDER PROCESSED BY BLOOD BANK   Type and screen     Status: None (Preliminary result)   Collection Time: 09/21/14  7:35 PM  Result Value Ref Range   ABO/RH(D) O NEG    Antibody Screen NEG    Sample Expiration 09/24/2014    Unit Number [id number]    Blood Component Type RED CELLS,LR    Unit division 00    Status of Unit ISSUED    Transfusion Status OK TO TRANSFUSE    Crossmatch Result Compatible   CBC     Status: Abnormal   Collection Time: 09/22/14  3:53 AM  Result Value Ref Range   WBC 1.7 (L) 4.0 - 10.5 K/uL   RBC 2.84 (L) 4.22 - 5.81 MIL/uL   Hemoglobin 8.1 (L) 13.0 - 17.0 g/dL   HCT 24.7 (L) 39.0 - 52.0 %   MCV 87.0 78.0 - 100.0 fL   MCH 28.5 26.0 - 34.0 pg   MCHC 32.8 30.0 - 36.0 g/dL   RDW 16.3 (H) 11.5 - 15.5 %   Platelets 246 150 - 400 K/uL    Studies/Results: No results found.    Assessment: Gastric ulcer status post  hemorrhage.  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 His ulcer did not have significant stigmata predictive of repeat hemorrhage. I think he can go home on a double dose proton pump inhibitor. I will arrange for CBC to be done as an outpatient early next week and follow-up with Dr. Michail Sermon in 2-3 weeks    Jazz Biddy C 09/22/2014, 8:03 AM  Pager [phone] If no answer or after 5 PM call [phone]

## 2014-09-25 ENCOUNTER — Ambulatory Visit: Payer: Medicare Other | Admitting: Nutrition

## 2014-09-25 ENCOUNTER — Ambulatory Visit
Admission: RE | Admit: 2014-09-25 | Discharge: 2014-09-25 | Disposition: A | Discharge status: Home / Self Care | Payer: Medicare Other | Source: Ambulatory Visit | Attending: Radiation Oncology | Admitting: Radiation Oncology

## 2014-09-25 DIAGNOSIS — C480 Malignant neoplasm of retroperitoneum: Secondary | ICD-10-CM | POA: Diagnosis present

## 2014-09-25 NOTE — Progress Notes (Signed)
Patient is frustrated with treatment. Can't wait to be finished.  Final treatment scheduled for July 7. Patient noted to have recent admission for gastric ulcer, status post hemorrhage.  Patient discharged on Friday, June 24. Current weight documented as 178.6 pounds June 20 down from 179.1 pounds June 6. Patient has poor appetite and decrease taste sensations. Patient reports he tolerates resource breeze better than and ensure or boost. States he knows he is not eating enough.  Nutrition diagnosis: Malnutrition continues.  Intervention: Educated patient to continue strategies for increasing calories and protein. Recommended patient continue oral nutrition supplements 3-4 times daily. Recommended patient control nausea and constipation as needed. Provided recipes to increase variety nutrition drinks. Teach back method used.  Monitoring, evaluation, goals: Patient will tolerate adequate calories and protein to minimize weight loss.  Next visit: Patient agrees to contact me with questions or concerns.  **Disclaimer: This note was dictated with voice recognition software. Similar sounding words can inadvertently be transcribed and this note may contain transcription errors which may not have been corrected upon publication of note.**

## 2014-09-26 ENCOUNTER — Ambulatory Visit (HOSPITAL_COMMUNITY)
Admission: RE | Admit: 2014-09-26 | Discharge: 2014-09-26 | Disposition: A | Discharge status: Home / Self Care | Payer: Medicare Other | Source: Ambulatory Visit | Attending: Diagnostic Radiology | Admitting: Diagnostic Radiology

## 2014-09-26 ENCOUNTER — Ambulatory Visit
Admission: RE | Admit: 2014-09-26 | Discharge: 2014-09-26 | Disposition: A | Discharge status: Home / Self Care | Payer: Medicare Other | Source: Ambulatory Visit | Attending: Radiation Oncology | Admitting: Radiation Oncology

## 2014-09-26 DIAGNOSIS — Z4803 Encounter for change or removal of drains: Secondary | ICD-10-CM | POA: Diagnosis not present

## 2014-09-26 DIAGNOSIS — Z4889 Encounter for other specified surgical aftercare: Secondary | ICD-10-CM | POA: Insufficient documentation

## 2014-09-26 DIAGNOSIS — Z905 Acquired absence of kidney: Secondary | ICD-10-CM | POA: Insufficient documentation

## 2014-09-26 DIAGNOSIS — C480 Malignant neoplasm of retroperitoneum: Secondary | ICD-10-CM | POA: Diagnosis not present

## 2014-09-26 DIAGNOSIS — L0291 Cutaneous abscess, unspecified: Secondary | ICD-10-CM

## 2014-09-26 MED ORDER — IOHEXOL 300 MG/ML  SOLN
50.0000 mL | Freq: Once | INTRAMUSCULAR | Status: AC | PRN
  Administered 2014-09-26: 10 mL via INTRAVENOUS

## 2014-09-26 NOTE — Procedures (Signed)
Technically successful resolution of postoperative retroperitoneal fluid collection following CT-guided percutaneous drainage catheter placement. No evidence of fistulous connection to any adjacent organ.  Above was discussed with referring physician, Dr. Barry Dienes, and the percutaneous drainage catheter was removed.

## 2014-09-27 ENCOUNTER — Ambulatory Visit
Admission: RE | Admit: 2014-09-27 | Discharge: 2014-09-27 | Disposition: A | Discharge status: Home / Self Care | Payer: Medicare Other | Source: Ambulatory Visit | Attending: Radiation Oncology | Admitting: Radiation Oncology

## 2014-09-27 DIAGNOSIS — C480 Malignant neoplasm of retroperitoneum: Secondary | ICD-10-CM | POA: Diagnosis not present

## 2014-09-28 ENCOUNTER — Telehealth: Payer: Self-pay | Admitting: Oncology

## 2014-09-28 ENCOUNTER — Ambulatory Visit
Admission: RE | Admit: 2014-09-28 | Discharge: 2014-09-28 | Disposition: A | Discharge status: Home / Self Care | Payer: Medicare Other | Source: Ambulatory Visit | Attending: Radiation Oncology | Admitting: Radiation Oncology

## 2014-09-28 ENCOUNTER — Other Ambulatory Visit: Payer: Self-pay | Admitting: Oncology

## 2014-09-28 ENCOUNTER — Ambulatory Visit: Payer: Medicare Other

## 2014-09-28 ENCOUNTER — Encounter: Payer: Self-pay | Admitting: Radiation Oncology

## 2014-09-28 VITALS — BP 110/60 | HR 111 | Resp 16 | Wt 174.5 lb

## 2014-09-28 DIAGNOSIS — D72819 Decreased white blood cell count, unspecified: Secondary | ICD-10-CM

## 2014-09-28 DIAGNOSIS — C480 Malignant neoplasm of retroperitoneum: Secondary | ICD-10-CM

## 2014-09-28 NOTE — Progress Notes (Signed)
  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  Current Dose: 62 Gy     Planned Dose:  66 Gy  Narrative: The patient presented to the clinic requesting to be seen. Meloy's weight and vitals are stable. His nausea has returned. He reports that he dry heaves often, has a small amount of vomiting, but denies abdominal pain. His reflux well managed with Protonix. "I went to half a tablet of Ativan in the morning, but it didn't work," the patient stated when requesting to take one entire Ativan pill before breakfast and one entire Ativan pill before dinner. When recently hospitalized, the patient's charts read he has developed anemia. Set-up films were reviewed. The chart was checked.  Physical Findings:  weight is 174 lb 8 oz (79.153 kg). His blood pressure is 110/60 and his pulse is 111. His respiration is 16.  The patient is alert and oriented X3.  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   Plan: The patient has been advised to continue treatment as planned.  His appointment with radiation oncology is to be scheduled in three weeks.  He is moving to [place] and would like to pursue follow-up care with an [place] expert.  His daughter who works in healthcare in [place] recommended Dr. Lehman Prom.    This document serves as a record of services personally performed by Tyler Pita, MD. It was created on his behalf by Lenn Cal, a trained medical scribe. The  creation of this record is based on the scribe's personal observations and the provider's statements to them. This document has been checked and approved by the attending provider.  ________________________________  [name], M.D.

## 2014-09-28 NOTE — Progress Notes (Addendum)
Weight and vitals stable. Nausea has returned. Reports small amount of vomiting. Reports that he dry heaves often. Denies abdominal pain. Reflux well managed with protonix.  BP 110/60 mmHg  Pulse 111  Resp 16  Wt 174 lb 8 oz (79.153 kg) Wt Readings from Last 3 Encounters:  09/28/14 174 lb 8 oz (79.153 kg)  09/18/14 178 lb 9.6 oz (81.012 kg)  09/18/14 176 lb 11.2 oz (80.151 kg)

## 2014-09-28 NOTE — Telephone Encounter (Signed)
s.w. pt and advised on JULY appt....pt ok and aware °

## 2014-09-29 ENCOUNTER — Ambulatory Visit: Payer: Medicare Other

## 2014-09-29 ENCOUNTER — Ambulatory Visit
Admission: RE | Admit: 2014-09-29 | Discharge: 2014-09-29 | Disposition: A | Discharge status: Home / Self Care | Payer: Medicare Other | Source: Ambulatory Visit | Attending: Radiation Oncology | Admitting: Radiation Oncology

## 2014-09-29 ENCOUNTER — Telehealth: Payer: Self-pay | Admitting: Radiation Oncology

## 2014-09-29 DIAGNOSIS — C480 Malignant neoplasm of retroperitoneum: Secondary | ICD-10-CM | POA: Diagnosis not present

## 2014-09-29 NOTE — Telephone Encounter (Signed)
Patient requested this RN obtain labs results from [place] and call him to explain. Obtained results. Explain H&H is holding its own but WBC are lower at 1.3. Encouraged patient to follow up with PCP or medical oncologist reference this low value. Patient verbalized understanding.

## 2014-10-03 ENCOUNTER — Ambulatory Visit: Payer: Medicare Other

## 2014-10-03 ENCOUNTER — Ambulatory Visit: Payer: Medicare Other | Admitting: Oncology

## 2014-10-03 ENCOUNTER — Other Ambulatory Visit: Payer: Medicare Other

## 2014-10-03 ENCOUNTER — Encounter: Payer: Self-pay | Admitting: Radiation Oncology

## 2014-10-03 ENCOUNTER — Ambulatory Visit
Admission: RE | Admit: 2014-10-03 | Discharge: 2014-10-03 | Disposition: A | Discharge status: Home / Self Care | Payer: Medicare Other | Source: Ambulatory Visit | Attending: Radiation Oncology | Admitting: Radiation Oncology

## 2014-10-03 ENCOUNTER — Telehealth: Payer: Self-pay | Admitting: *Deleted

## 2014-10-03 DIAGNOSIS — C480 Malignant neoplasm of retroperitoneum: Secondary | ICD-10-CM | POA: Diagnosis not present

## 2014-10-03 NOTE — Telephone Encounter (Signed)
Called patient re: cancelled appt this am. Patient states he does not want to r/s with dr Alen Blew at this time. Note to dr Hazeline Junker desk

## 2014-10-04 ENCOUNTER — Ambulatory Visit: Payer: Medicare Other

## 2014-10-05 ENCOUNTER — Ambulatory Visit: Payer: Medicare Other

## 2014-10-10 ENCOUNTER — Telehealth: Payer: Self-pay | Admitting: Radiation Oncology

## 2014-10-10 NOTE — Telephone Encounter (Signed)
Received message from patient today "desperate for medication to relieve cough and rib pain." Phoned patient to inquire. Patient reports the cough presented within hours of radiation completion. Reports the cough is occasionally productive with clear sputum. Reports the frequency and intensity of the cough have cause his ribs to be "painfully sore" 5 on a scale of 0-10. Denies difficult or painful swallowing. Denies a sore throat, fever or exposure to others who are sick. Reports he is taking his protonix as prescribed for reflux. Questioned when he last saw his PCP for check up and blood draw. Patient explains he saw his PCP last week because of hiccups that would stop but, didn't discuss the cough with him. Reports his PCP told him "the last radiation treatment must have hit your diaphragm causing these hiccups." Patient reports he received a shot that stopped the hiccups. Patient understands this RN will call him back with further direction after speaking with Dr. Tammi Klippel.

## 2014-10-11 ENCOUNTER — Telehealth: Payer: Self-pay | Admitting: Radiation Oncology

## 2014-10-11 ENCOUNTER — Emergency Department (HOSPITAL_COMMUNITY): Payer: Medicare Other

## 2014-10-11 ENCOUNTER — Encounter (HOSPITAL_COMMUNITY): Payer: Self-pay | Admitting: Emergency Medicine

## 2014-10-11 ENCOUNTER — Inpatient Hospital Stay (HOSPITAL_COMMUNITY)
Admission: EM | Admit: 2014-10-11 | Discharge: 2014-10-20 | DRG: 357 | Disposition: A | Discharge status: Home Health | Payer: Medicare Other | Attending: General Surgery | Admitting: General Surgery

## 2014-10-11 DIAGNOSIS — E440 Moderate protein-calorie malnutrition: Secondary | ICD-10-CM | POA: Diagnosis present

## 2014-10-11 DIAGNOSIS — K6819 Other retroperitoneal abscess: Principal | ICD-10-CM | POA: Diagnosis present

## 2014-10-11 DIAGNOSIS — K56609 Unspecified intestinal obstruction, unspecified as to partial versus complete obstruction: Secondary | ICD-10-CM | POA: Diagnosis present

## 2014-10-11 DIAGNOSIS — E780 Pure hypercholesterolemia: Secondary | ICD-10-CM | POA: Diagnosis present

## 2014-10-11 DIAGNOSIS — D509 Iron deficiency anemia, unspecified: Secondary | ICD-10-CM | POA: Diagnosis present

## 2014-10-11 DIAGNOSIS — R112 Nausea with vomiting, unspecified: Secondary | ICD-10-CM | POA: Diagnosis not present

## 2014-10-11 DIAGNOSIS — Z6826 Body mass index (BMI) 26.0-26.9, adult: Secondary | ICD-10-CM

## 2014-10-11 DIAGNOSIS — IMO0002 Reserved for concepts with insufficient information to code with codable children: Secondary | ICD-10-CM | POA: Diagnosis present

## 2014-10-11 DIAGNOSIS — E46 Unspecified protein-calorie malnutrition: Secondary | ICD-10-CM | POA: Insufficient documentation

## 2014-10-11 DIAGNOSIS — Z905 Acquired absence of kidney: Secondary | ICD-10-CM | POA: Diagnosis present

## 2014-10-11 DIAGNOSIS — N189 Chronic kidney disease, unspecified: Secondary | ICD-10-CM | POA: Diagnosis present

## 2014-10-11 DIAGNOSIS — R188 Other ascites: Secondary | ICD-10-CM | POA: Insufficient documentation

## 2014-10-11 DIAGNOSIS — I129 Hypertensive chronic kidney disease with stage 1 through stage 4 chronic kidney disease, or unspecified chronic kidney disease: Secondary | ICD-10-CM | POA: Diagnosis present

## 2014-10-11 DIAGNOSIS — Y836 Removal of other organ (partial) (total) as the cause of abnormal reaction of the patient, or of later complication, without mention of misadventure at the time of the procedure: Secondary | ICD-10-CM | POA: Diagnosis present

## 2014-10-11 DIAGNOSIS — Z923 Personal history of irradiation: Secondary | ICD-10-CM

## 2014-10-11 DIAGNOSIS — K56600 Partial intestinal obstruction, unspecified as to cause: Secondary | ICD-10-CM | POA: Insufficient documentation

## 2014-10-11 DIAGNOSIS — T814XXA Infection following a procedure, initial encounter: Secondary | ICD-10-CM | POA: Diagnosis present

## 2014-10-11 DIAGNOSIS — K644 Residual hemorrhoidal skin tags: Secondary | ICD-10-CM | POA: Diagnosis present

## 2014-10-11 DIAGNOSIS — K567 Ileus, unspecified: Secondary | ICD-10-CM | POA: Diagnosis not present

## 2014-10-11 DIAGNOSIS — Z888 Allergy status to other drugs, medicaments and biological substances status: Secondary | ICD-10-CM

## 2014-10-11 DIAGNOSIS — Z792 Long term (current) use of antibiotics: Secondary | ICD-10-CM

## 2014-10-11 DIAGNOSIS — Z79899 Other long term (current) drug therapy: Secondary | ICD-10-CM

## 2014-10-11 DIAGNOSIS — D638 Anemia in other chronic diseases classified elsewhere: Secondary | ICD-10-CM | POA: Diagnosis present

## 2014-10-11 DIAGNOSIS — J90 Pleural effusion, not elsewhere classified: Secondary | ICD-10-CM | POA: Diagnosis not present

## 2014-10-11 DIAGNOSIS — Z79891 Long term (current) use of opiate analgesic: Secondary | ICD-10-CM

## 2014-10-11 DIAGNOSIS — C480 Malignant neoplasm of retroperitoneum: Secondary | ICD-10-CM | POA: Diagnosis present

## 2014-10-11 LAB — COMPREHENSIVE METABOLIC PANEL
ALBUMIN: 2.4 g/dL — AB (ref 3.5–5.0)
ALT: 22 U/L (ref 17–63)
AST: 24 U/L (ref 15–41)
Alkaline Phosphatase: 101 U/L (ref 38–126)
Anion gap: 11 (ref 5–15)
BUN: 22 mg/dL — ABNORMAL HIGH (ref 6–20)
CO2: 24 mmol/L (ref 22–32)
CREATININE: 1.31 mg/dL — AB (ref 0.61–1.24)
Calcium: 8.7 mg/dL — ABNORMAL LOW (ref 8.9–10.3)
Chloride: 93 mmol/L — ABNORMAL LOW (ref 101–111)
GFR calc non Af Amer: 54 mL/min — ABNORMAL LOW (ref >60)
GLUCOSE: 125 mg/dL — AB (ref 65–99)
Potassium: 3 mmol/L — ABNORMAL LOW (ref 3.5–5.1)
SODIUM: 128 mmol/L — AB (ref 135–145)
Total Bilirubin: 0.5 mg/dL (ref 0.3–1.2)
Total Protein: 6.6 g/dL (ref 6.5–8.1)

## 2014-10-11 LAB — URINALYSIS, ROUTINE W REFLEX MICROSCOPIC
Bilirubin Urine: NEGATIVE
GLUCOSE, UA: NEGATIVE mg/dL
HGB URINE DIPSTICK: NEGATIVE
KETONES UR: NEGATIVE mg/dL
Leukocytes, UA: NEGATIVE
Nitrite: NEGATIVE
PROTEIN: 30 mg/dL — AB
SPECIFIC GRAVITY, URINE: 1.023 (ref 1.005–1.030)
UROBILINOGEN UA: 1 mg/dL (ref 0.0–1.0)
pH: 6 (ref 5.0–8.0)

## 2014-10-11 LAB — LIPASE, BLOOD: LIPASE: 17 U/L — AB (ref 22–51)

## 2014-10-11 LAB — CBC
HEMATOCRIT: 25.9 % — AB (ref 39.0–52.0)
HEMOGLOBIN: 8.5 g/dL — AB (ref 13.0–17.0)
MCH: 27.3 pg (ref 26.0–34.0)
MCHC: 32.8 g/dL (ref 30.0–36.0)
MCV: 83.3 fL (ref 78.0–100.0)
Platelets: 690 10*3/uL — ABNORMAL HIGH (ref 150–400)
RBC: 3.11 MIL/uL — ABNORMAL LOW (ref 4.22–5.81)
RDW: 16.4 % — ABNORMAL HIGH (ref 11.5–15.5)
WBC: 7.6 10*3/uL (ref 4.0–10.5)

## 2014-10-11 LAB — I-STAT CG4 LACTIC ACID, ED
Lactic Acid, Venous: 0.8 mmol/L (ref 0.5–2.0)
Lactic Acid, Venous: 0.72 mmol/L (ref 0.5–2.0)

## 2014-10-11 LAB — URINE MICROSCOPIC-ADD ON

## 2014-10-11 MED ORDER — ONDANSETRON HCL 4 MG/2ML IJ SOLN
4.0000 mg | Freq: Once | INTRAMUSCULAR | Status: AC
Start: 2014-10-11 — End: 2014-10-11
  Administered 2014-10-11: 4 mg via INTRAVENOUS
  Filled 2014-10-11: qty 2

## 2014-10-11 MED ORDER — SODIUM CHLORIDE 0.9 % IV BOLUS (SEPSIS)
2000.0000 mL | Freq: Once | INTRAVENOUS | Status: AC
  Administered 2014-10-11: 2000 mL via INTRAVENOUS

## 2014-10-11 MED ORDER — METOCLOPRAMIDE HCL 5 MG/ML IJ SOLN
10.0000 mg | Freq: Once | INTRAMUSCULAR | Status: AC
  Administered 2014-10-11: 10 mg via INTRAVENOUS
  Filled 2014-10-11: qty 2

## 2014-10-11 MED ORDER — LORAZEPAM 2 MG/ML IJ SOLN
1.0000 mg | Freq: Once | INTRAMUSCULAR | Status: AC
Start: 2014-10-11 — End: 2014-10-11
  Administered 2014-10-11: 1 mg via INTRAVENOUS
  Filled 2014-10-11: qty 1

## 2014-10-11 MED ORDER — POTASSIUM CHLORIDE 10 MEQ/100ML IV SOLN
10.0000 meq | Freq: Once | INTRAVENOUS | Status: AC
  Administered 2014-10-11: 10 meq via INTRAVENOUS
  Filled 2014-10-11: qty 100

## 2014-10-11 MED ORDER — DIAZEPAM 5 MG PO TABS
5.0000 mg | ORAL_TABLET | Freq: Once | ORAL | Status: AC
Start: 2014-10-11 — End: 2014-10-11
  Administered 2014-10-11: 5 mg via ORAL
  Filled 2014-10-11: qty 1

## 2014-10-11 MED ORDER — SODIUM CHLORIDE 0.9 % IV SOLN
Freq: Once | INTRAVENOUS | Status: AC
  Administered 2014-10-12: via INTRAVENOUS

## 2014-10-11 MED ORDER — ONDANSETRON HCL 4 MG/2ML IJ SOLN
4.0000 mg | Freq: Once | INTRAMUSCULAR | Status: AC
  Administered 2014-10-11: 4 mg via INTRAVENOUS
  Filled 2014-10-11: qty 2

## 2014-10-11 MED ORDER — ONDANSETRON 8 MG PO TBDP: 8.0000 mg | ORAL_TABLET | Freq: Three times a day (TID) | ORAL | Status: DC | PRN

## 2014-10-11 NOTE — Telephone Encounter (Signed)
Phoned patient with directions from Dr. Tammi Klippel to take Robitussin DM or Mucinex DM for cough and hydrate. Patient verbalized understanding. Then, the patient proceeds to say he began experiencing nausea and vomiting at midnight last night. Explains this has persistent on into today. Denies fever or other symptoms. Explained this isn't a late side effect of radiation therapy. Advised patient to follow up with GI surgeon or PCP. Patient verbalized understanding.

## 2014-10-11 NOTE — ED Notes (Signed)
PO challenged with crackers and water. No vomiting/nausea noted. Hiccups still noted.

## 2014-10-11 NOTE — ED Provider Notes (Signed)
CSN: [id number]     Arrival date & time 10/11/14  1628 History   First MD Initiated Contact with Patient 10/11/14 1833     Chief Complaint  Patient presents with  . Nausea  . Emesis      The history is provided by the patient.   patient presents to the emergency department complaining of nausea and vomiting which began today.  He's also had some loose stools today.  His been passing flatus.  Patient does have a history of abdominal surgery where he underwent resection of a right peritoneal mass on April 2016.  He denies abdominal pain at this time.  No fevers or chills.  Denies coffee ground emesis or hematemesis.  No blood in his stool.  Past Medical History  Diagnosis Date  . Hypertension   . Elevated cholesterol   . Anginal pain     hx of CP saw Dr Wynonia Lawman 09/20/2013 H&P per chart   . Left Retroperitoneal mass s/p resection/L nephrectomy 07/13/2014   . Dedifferentiated liposarcoma   . 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 Postoperative anemia due to acute blood loss 07/15/2014  . SBO (small bowel obstruction) 08/01/2014  . Retroperitoneal fluid collection   . Protein-calorie malnutrition, severe 08/02/2014  . Thrombocytopenia 07/15/2014   Past Surgical History  Procedure Laterality Date  . Surgery for arm fracture Right 1952  . Knee arthroscopy Right 1970's  . Colonoscopy with propofol N/A 10/20/2013    Procedure: COLONOSCOPY WITH PROPOFOL;  Surgeon: Lear Ng, MD;  Location: [facility];  Service: Endoscopy;  Laterality: N/A;  . Hot hemostasis N/A 10/20/2013    Procedure: HOT HEMOSTASIS (ARGON PLASMA COAGULATION/BICAP);  Surgeon: Lear Ng, MD;  Location: [facility];  Service: Endoscopy;  Laterality: N/A;  . Tigger finger release      per H&P per Dr Wynonia Lawman 09/20/2013 (on chart)  . Resection of retroperitoneal mass Left 07/13/2014    Procedure: RESECTION OF left RETROPERITONEAL MASS;   Surgeon: Stark Klein, MD;  Location: [facility];  Service: General;  Laterality: Left;  . Esophagogastroduodenoscopy (egd) with propofol N/A 09/19/2014    Procedure: ESOPHAGOGASTRODUODENOSCOPY (EGD) WITH PROPOFOL;  Surgeon: Wilford Corner, MD;  Location: [facility];  Service: Endoscopy;  Laterality: N/A;   Family History  Problem Relation Age of Onset  . Adopted: Yes  . Family history unknown: Yes   History  Substance Use Topics  . Smoking status: Never Smoker   . Smokeless tobacco: Never Used  . Alcohol Use: No    Review of Systems  All other systems reviewed and are negative.     Allergies  Compazine  Home Medications   Prior to Admission medications   Medication Sig Start Date End Date Taking? Authorizing Provider  LORazepam (ATIVAN) 1 MG tablet Take 1 tablet by mouth 3 (three) times daily as needed for anxiety. Anxiety/nausea 09/01/14  Yes Historical Provider, MD  Melatonin 5 MG CAPS Take 1 capsule by mouth at bedtime.  07/27/14  Yes Historical Provider, MD  metoCLOPramide (REGLAN) 10 MG/10ML SOLN Take 10 mLs (10 mg total) by mouth 4 (four) times daily -  before meals and at bedtime. 08/21/14  Yes Tyler Pita, MD  Multiple Vitamin (MULTIVITAMIN WITH MINERALS) TABS tablet Take 1 tablet by mouth every morning.    Yes Historical Provider, MD  pantoprazole (PROTONIX) 40 MG tablet Take 1 tablet (40 mg total) by mouth daily. 09/22/14  Yes Orpah Melter  Wyline Copas, MD  traZODone (DESYREL) 50 MG tablet Take 50 mg by mouth at bedtime.    Yes Historical Provider, MD  ondansetron (ZOFRAN ODT) 8 MG disintegrating tablet Take 1 tablet (8 mg total) by mouth every 8 (eight) hours as needed for nausea or vomiting. 10/11/14   Jola Schmidt, MD  oxyCODONE-acetaminophen (PERCOCET/ROXICET) 5-325 MG per tablet Take 1-2 tablets by mouth every 6 (six) hours as needed for severe pain. 08/04/14   Stark Klein, MD   BP 120/65 mmHg  Pulse 95  Temp(Src) 97.8 F (36.6 C) (Oral)  Resp 18  Ht 5\' 8"  (1.727 m)  Wt  172 lb (78.019 kg)  BMI 26.16 kg/m2  SpO2 96% Physical Exam  Constitutional: He is oriented to person, place, and time. He appears well-developed and well-nourished.  HENT:  Head: Normocephalic and atraumatic.  Eyes: EOM are normal.  Neck: Normal range of motion.  Cardiovascular: Normal rate, regular rhythm, normal heart sounds and intact distal pulses.   Pulmonary/Chest: Effort normal and breath sounds normal. No respiratory distress.  Abdominal: Soft. He exhibits no distension. There is no tenderness.  Musculoskeletal: Normal range of motion.  Neurological: He is alert and oriented to person, place, and time.  Skin: Skin is warm and dry. There is pallor.  Psychiatric: He has a normal mood and affect. Judgment normal.  Nursing note and vitals reviewed.   ED Course  Procedures (including critical care time) Labs Review Labs Reviewed  LIPASE, BLOOD - Abnormal; Notable for the following:    Lipase 17 (*)    All other components within normal limits  COMPREHENSIVE METABOLIC PANEL - Abnormal; Notable for the following:    Sodium 128 (*)    Potassium 3.0 (*)    Chloride 93 (*)    Glucose, Bld 125 (*)    BUN 22 (*)    Creatinine, Ser 1.31 (*)    Calcium 8.7 (*)    Albumin 2.4 (*)    GFR calc non Af Amer 54 (*)    All other components within normal limits  CBC - Abnormal; Notable for the following:    RBC 3.11 (*)    Hemoglobin 8.5 (*)    HCT 25.9 (*)    RDW 16.4 (*)    Platelets 690 (*)    All other components within normal limits  URINALYSIS, ROUTINE W REFLEX MICROSCOPIC (NOT AT [facility]) - Abnormal; Notable for the following:    APPearance CLOUDY (*)    Protein, ur 30 (*)    All other components within normal limits  URINE MICROSCOPIC-ADD ON - Abnormal; Notable for the following:    Casts HYALINE CASTS (*)    All other components within normal limits  I-STAT CG4 LACTIC ACID, ED  I-STAT CG4 LACTIC ACID, ED    Imaging Review No results found.   EKG  Interpretation None      MDM   Final diagnoses:  Nausea and vomiting, vomiting of unspecified type   Pt is feeling better at this time. Repeat abdominal exam is benign. Tolerating oral fluids and food. Hiccups resolved. Dc home with pcp follow up   11:34 PM Pt is now vomiting again. Will given reglan and zofran. Given hx of abd surgery will obtain plain film of abdomen to evaluate for air fluid levels.  12:12 AM Plain film concerning for partial small bowel versus small bowel obstruction.  NG tube will be placed at this time.  Patient will undergo CT imaging to further define the extent of bowel  obstruction.  Will consult hospitalist this time for admission.  Patient will remain in the emergency department until CT scan is completed.  If there is anything surgical acutely the surgical bed contacted otherwise the morning team can be contacted     Jola Schmidt, MD 10/12/14 0013

## 2014-10-11 NOTE — ED Notes (Signed)
Pt complaint of n/v since yesterday; no diarrhea; no pain.

## 2014-10-11 NOTE — Telephone Encounter (Signed)
Would try Robitussin DM or Mucinex DM  With good hydration

## 2014-10-12 ENCOUNTER — Encounter (HOSPITAL_COMMUNITY): Payer: Self-pay | Admitting: Radiology

## 2014-10-12 ENCOUNTER — Inpatient Hospital Stay (HOSPITAL_COMMUNITY): Payer: Medicare Other

## 2014-10-12 DIAGNOSIS — K56609 Unspecified intestinal obstruction, unspecified as to partial versus complete obstruction: Secondary | ICD-10-CM | POA: Diagnosis present

## 2014-10-12 DIAGNOSIS — D638 Anemia in other chronic diseases classified elsewhere: Secondary | ICD-10-CM | POA: Diagnosis present

## 2014-10-12 DIAGNOSIS — Y836 Removal of other organ (partial) (total) as the cause of abnormal reaction of the patient, or of later complication, without mention of misadventure at the time of the procedure: Secondary | ICD-10-CM | POA: Diagnosis present

## 2014-10-12 DIAGNOSIS — E780 Pure hypercholesterolemia: Secondary | ICD-10-CM | POA: Diagnosis present

## 2014-10-12 DIAGNOSIS — R188 Other ascites: Secondary | ICD-10-CM | POA: Insufficient documentation

## 2014-10-12 DIAGNOSIS — B9689 Other specified bacterial agents as the cause of diseases classified elsewhere: Secondary | ICD-10-CM | POA: Diagnosis not present

## 2014-10-12 DIAGNOSIS — C499 Malignant neoplasm of connective and soft tissue, unspecified: Secondary | ICD-10-CM | POA: Diagnosis not present

## 2014-10-12 DIAGNOSIS — IMO0002 Reserved for concepts with insufficient information to code with codable children: Secondary | ICD-10-CM | POA: Diagnosis present

## 2014-10-12 DIAGNOSIS — Z6826 Body mass index (BMI) 26.0-26.9, adult: Secondary | ICD-10-CM | POA: Diagnosis not present

## 2014-10-12 DIAGNOSIS — N189 Chronic kidney disease, unspecified: Secondary | ICD-10-CM | POA: Diagnosis present

## 2014-10-12 DIAGNOSIS — D509 Iron deficiency anemia, unspecified: Secondary | ICD-10-CM | POA: Diagnosis present

## 2014-10-12 DIAGNOSIS — E440 Moderate protein-calorie malnutrition: Secondary | ICD-10-CM | POA: Diagnosis present

## 2014-10-12 DIAGNOSIS — I129 Hypertensive chronic kidney disease with stage 1 through stage 4 chronic kidney disease, or unspecified chronic kidney disease: Secondary | ICD-10-CM | POA: Diagnosis present

## 2014-10-12 DIAGNOSIS — E46 Unspecified protein-calorie malnutrition: Secondary | ICD-10-CM | POA: Diagnosis present

## 2014-10-12 DIAGNOSIS — J90 Pleural effusion, not elsewhere classified: Secondary | ICD-10-CM | POA: Diagnosis not present

## 2014-10-12 DIAGNOSIS — C480 Malignant neoplasm of retroperitoneum: Secondary | ICD-10-CM | POA: Diagnosis present

## 2014-10-12 DIAGNOSIS — R112 Nausea with vomiting, unspecified: Secondary | ICD-10-CM | POA: Diagnosis present

## 2014-10-12 DIAGNOSIS — K567 Ileus, unspecified: Secondary | ICD-10-CM | POA: Diagnosis not present

## 2014-10-12 DIAGNOSIS — Z79891 Long term (current) use of opiate analgesic: Secondary | ICD-10-CM | POA: Diagnosis not present

## 2014-10-12 DIAGNOSIS — Z79899 Other long term (current) drug therapy: Secondary | ICD-10-CM | POA: Diagnosis not present

## 2014-10-12 DIAGNOSIS — Z888 Allergy status to other drugs, medicaments and biological substances status: Secondary | ICD-10-CM | POA: Diagnosis not present

## 2014-10-12 DIAGNOSIS — K651 Peritoneal abscess: Secondary | ICD-10-CM | POA: Diagnosis not present

## 2014-10-12 DIAGNOSIS — T814XXA Infection following a procedure, initial encounter: Secondary | ICD-10-CM | POA: Diagnosis present

## 2014-10-12 DIAGNOSIS — Z923 Personal history of irradiation: Secondary | ICD-10-CM | POA: Diagnosis not present

## 2014-10-12 DIAGNOSIS — K5660 Unspecified intestinal obstruction: Secondary | ICD-10-CM | POA: Diagnosis present

## 2014-10-12 DIAGNOSIS — K5669 Other intestinal obstruction: Secondary | ICD-10-CM | POA: Diagnosis not present

## 2014-10-12 DIAGNOSIS — K644 Residual hemorrhoidal skin tags: Secondary | ICD-10-CM | POA: Diagnosis present

## 2014-10-12 DIAGNOSIS — K6819 Other retroperitoneal abscess: Secondary | ICD-10-CM | POA: Diagnosis present

## 2014-10-12 DIAGNOSIS — Z905 Acquired absence of kidney: Secondary | ICD-10-CM | POA: Diagnosis present

## 2014-10-12 LAB — BASIC METABOLIC PANEL
Anion gap: 9 (ref 5–15)
BUN: 21 mg/dL — ABNORMAL HIGH (ref 6–20)
CHLORIDE: 100 mmol/L — AB (ref 101–111)
CO2: 23 mmol/L (ref 22–32)
CREATININE: 1.35 mg/dL — AB (ref 0.61–1.24)
Calcium: 8 mg/dL — ABNORMAL LOW (ref 8.9–10.3)
GFR, EST AFRICAN AMERICAN: 60 mL/min — AB (ref >60)
GFR, EST NON AFRICAN AMERICAN: 52 mL/min — AB (ref >60)
GLUCOSE: 102 mg/dL — AB (ref 65–99)
POTASSIUM: 3.1 mmol/L — AB (ref 3.5–5.1)
SODIUM: 132 mmol/L — AB (ref 135–145)

## 2014-10-12 LAB — CBC
HCT: 20.9 % — ABNORMAL LOW (ref 39.0–52.0)
Hemoglobin: 6.8 g/dL — CL (ref 13.0–17.0)
MCH: 26.8 pg (ref 26.0–34.0)
MCHC: 32.5 g/dL (ref 30.0–36.0)
MCV: 82.3 fL (ref 78.0–100.0)
PLATELETS: 578 10*3/uL — AB (ref 150–400)
RBC: 2.54 MIL/uL — ABNORMAL LOW (ref 4.22–5.81)
RDW: 16.3 % — AB (ref 11.5–15.5)
WBC: 6.4 10*3/uL (ref 4.0–10.5)

## 2014-10-12 MED ORDER — PANTOPRAZOLE SODIUM 40 MG IV SOLR
40.0000 mg | Freq: Every day | INTRAVENOUS | Status: DC
  Administered 2014-10-12 – 2014-10-19 (×8): 40 mg via INTRAVENOUS
  Filled 2014-10-12 (×8): qty 40

## 2014-10-12 MED ORDER — PIPERACILLIN-TAZOBACTAM 3.375 G IVPB
3.3750 g | Freq: Three times a day (TID) | INTRAVENOUS | Status: DC
  Administered 2014-10-12 – 2014-10-13 (×3): 3.375 g via INTRAVENOUS
  Filled 2014-10-12 (×4): qty 50

## 2014-10-12 MED ORDER — TRAZODONE HCL 50 MG PO TABS
50.0000 mg | ORAL_TABLET | Freq: Every day | ORAL | Status: DC
  Administered 2014-10-12 – 2014-10-13 (×2): 50 mg via ORAL
  Filled 2014-10-12 (×11): qty 1

## 2014-10-12 MED ORDER — FENTANYL CITRATE (PF) 100 MCG/2ML IJ SOLN
INTRAMUSCULAR | Status: AC | PRN
  Administered 2014-10-12: 50 ug via INTRAVENOUS

## 2014-10-12 MED ORDER — KCL IN DEXTROSE-NACL 20-5-0.9 MEQ/L-%-% IV SOLN
INTRAVENOUS | Status: DC
  Administered 2014-10-12 – 2014-10-20 (×15): via INTRAVENOUS
  Filled 2014-10-12 (×21): qty 1000

## 2014-10-12 MED ORDER — PIPERACILLIN-TAZOBACTAM 3.375 G IVPB
3.3750 g | Freq: Once | INTRAVENOUS | Status: AC
Start: 2014-10-12 — End: 2014-10-12
  Administered 2014-10-12: 3.375 g via INTRAVENOUS
  Filled 2014-10-12: qty 50

## 2014-10-12 MED ORDER — POTASSIUM CHLORIDE 10 MEQ/100ML IV SOLN
10.0000 meq | INTRAVENOUS | Status: AC
  Administered 2014-10-12 (×4): 10 meq via INTRAVENOUS
  Filled 2014-10-12: qty 100

## 2014-10-12 MED ORDER — FENTANYL CITRATE (PF) 100 MCG/2ML IJ SOLN
INTRAMUSCULAR | Status: AC
  Filled 2014-10-12: qty 4

## 2014-10-12 MED ORDER — MIDAZOLAM HCL 2 MG/2ML IJ SOLN
INTRAMUSCULAR | Status: AC | PRN
  Administered 2014-10-12 (×4): 1 mg via INTRAVENOUS

## 2014-10-12 MED ORDER — IOHEXOL 300 MG/ML  SOLN
100.0000 mL | Freq: Once | INTRAMUSCULAR | Status: AC | PRN
Start: 2014-10-12 — End: 2014-10-12
  Administered 2014-10-12: 100 mL via INTRAVENOUS

## 2014-10-12 MED ORDER — VANCOMYCIN HCL 10 G IV SOLR
1500.0000 mg | Freq: Once | INTRAVENOUS | Status: AC
  Administered 2014-10-12: 1500 mg via INTRAVENOUS
  Filled 2014-10-12: qty 1500

## 2014-10-12 MED ORDER — SODIUM CHLORIDE 0.9 % IV SOLN
Freq: Once | INTRAVENOUS | Status: AC
  Administered 2014-10-12: 19:00:00 via INTRAVENOUS

## 2014-10-12 MED ORDER — ONDANSETRON HCL 4 MG/2ML IJ SOLN
4.0000 mg | Freq: Four times a day (QID) | INTRAMUSCULAR | Status: DC | PRN
  Administered 2014-10-17 – 2014-10-19 (×3): 4 mg via INTRAVENOUS
  Filled 2014-10-12 (×3): qty 2

## 2014-10-12 MED ORDER — SODIUM CHLORIDE 0.9 % IJ SOLN
10.0000 mL | INTRAMUSCULAR | Status: DC | PRN
  Administered 2014-10-13 – 2014-10-19 (×5): 10 mL
  Administered 2014-10-20: 20 mL
  Filled 2014-10-12 (×6): qty 40

## 2014-10-12 MED ORDER — METOCLOPRAMIDE HCL 5 MG/ML IJ SOLN
10.0000 mg | Freq: Three times a day (TID) | INTRAMUSCULAR | Status: DC
  Administered 2014-10-12 – 2014-10-20 (×26): 10 mg via INTRAVENOUS
  Filled 2014-10-12 (×31): qty 2

## 2014-10-12 MED ORDER — MORPHINE SULFATE 2 MG/ML IJ SOLN: 2.0000 mg | INTRAMUSCULAR | Status: DC | PRN

## 2014-10-12 MED ORDER — HEPARIN SODIUM (PORCINE) 5000 UNIT/ML IJ SOLN
5000.0000 [IU] | Freq: Three times a day (TID) | INTRAMUSCULAR | Status: DC
  Filled 2014-10-12: qty 1

## 2014-10-12 MED ORDER — IOHEXOL 300 MG/ML  SOLN
80.0000 mL | Freq: Once | INTRAMUSCULAR | Status: AC | PRN
  Administered 2014-10-12: 80 mL via INTRAVENOUS

## 2014-10-12 MED ORDER — PIPERACILLIN-TAZOBACTAM 4.5 G IVPB: 4.5000 g | Freq: Once | INTRAVENOUS | Status: DC

## 2014-10-12 MED ORDER — SODIUM CHLORIDE 0.9 % IV BOLUS (SEPSIS)
500.0000 mL | Freq: Once | INTRAVENOUS | Status: AC
  Administered 2014-10-12: 500 mL via INTRAVENOUS

## 2014-10-12 MED ORDER — LORAZEPAM 2 MG/ML IJ SOLN
1.0000 mg | Freq: Three times a day (TID) | INTRAMUSCULAR | Status: DC | PRN
  Administered 2014-10-14 – 2014-10-20 (×7): 1 mg via INTRAVENOUS
  Filled 2014-10-12: qty 1
  Filled 2014-10-12 (×2): qty 0.5
  Filled 2014-10-12 (×2): qty 1
  Filled 2014-10-12: qty 0.5
  Filled 2014-10-12 (×2): qty 1
  Filled 2014-10-12: qty 0.5
  Filled 2014-10-12 (×2): qty 1

## 2014-10-12 MED ORDER — MIDAZOLAM HCL 2 MG/2ML IJ SOLN
INTRAMUSCULAR | Status: AC
  Filled 2014-10-12: qty 6

## 2014-10-12 MED ORDER — LIDOCAINE HCL 1 % IJ SOLN
INTRAMUSCULAR | Status: AC
  Filled 2014-10-12: qty 20

## 2014-10-12 MED ORDER — SODIUM CHLORIDE 0.9 % IV SOLN
12.5000 mg | Freq: Four times a day (QID) | INTRAVENOUS | Status: DC | PRN
  Administered 2014-10-12 – 2014-10-19 (×4): 12.5 mg via INTRAVENOUS
  Filled 2014-10-12 (×9): qty 0.5

## 2014-10-12 NOTE — Procedures (Signed)
Interventional Radiology Procedure Note  Procedure: Placement of 11F drain modified with additional sideholes into the left retroperitoneal abscess collection.  Aspiration yields 100 mL thick, purulent fluid.   Complications: None  Estimated Blood Loss: 0   Recommendations: - Fluid sent for Cx - Given recurrent abscess, recommend long course IV abx and prolonged drain placement  Signed,  Criselda Peaches, MD

## 2014-10-12 NOTE — Progress Notes (Signed)
  Radiation Oncology         [phone] ________________________________  Name: Darryl Griffith MRN: [id number]  Date: 10/03/2014  DOB: 1945-10-18  End of Treatment Note   ICD-9-CM ICD-10-CM    1. 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      Indication for treatment:  Curative, Adjuvant Radiotherapy       Radiation treatment dates:   08/14/2014-10/03/2014  Site/dose:    1.  The original tumor site, tumor bed were initially treated to 50 Gy in 25 fractions of 2 Gy. 2.  The tumor bed, guided by surgical clips was boosted, excluding the bowel was treated to 66 Gy with 8 fractions of 2 Gy.  Beams/energy:    1.  The original tumor site, tumor bed  were initially treated using helical intensity modulated radiotherapy delivering 6 megavolt photons. Image guidance was performed with megavoltage CT studies prior to each fraction. He was immobilized with a body fix lower extremity mold.  2.  The tumor bed, guided by surgical clips was boosted using helical intensity modulated radiotherapy delivering 6 megavolt photons. Image guidance was performed with megavoltage CT studies prior to each fraction. He was immobilized with a body fix lower extremity mold  Narrative: The patient tolerated radiation treatment relatively poorly.  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  He suffered with ongoing weight loss through radiation.  Plan: The patient has completed radiation treatment. The patient will return to radiation oncology clinic for routine followup in one month. I advised him to call or return sooner if he has any questions or concerns related to his recovery or treatment. ________________________________  [name], M.D.

## 2014-10-12 NOTE — Progress Notes (Signed)
Utilization review completed.  

## 2014-10-12 NOTE — ED Notes (Signed)
Pts bed and gown was changed, waiting on hospitalist consult

## 2014-10-12 NOTE — Procedures (Signed)
Successful placement of dual lumen PICC line to right brachial vein. Length 39 cm Tip at lower SVC/RA No complications Ready for use.  Tanashia Ciesla S Leyana Whidden PA-C @TIMESTAMP @

## 2014-10-12 NOTE — Progress Notes (Signed)
CRITICAL VALUE ALERT  Critical value received:  Hgb 6.8  Date of notification:  10/12/14  Time of notification:  7903  Critical value read back:Yes.    Nurse who received alert:  Catie Despina Pole  MD notified (1st page):  CCS  Time of first page:  0935  MD notified (2nd page):  Time of second page:  Responding MD:  Oswaldo Milian, NP  Time MD responded:  1300

## 2014-10-12 NOTE — ED Provider Notes (Signed)
Patient was signed out to me by Dr. Venora Maples is pending CT scan the abdomen to evaluate for small bowel obstruction. CT scan does reveal an obstruction with transition point in the left upper abdomen. Also reveals reaccumulation of abscess next to retro-peritoneal tumor. Patient has multiple abscesses along his stomach as well. I spoke with Dr. Redmond Pulling with surgery recommends for medical admission and consultation with IR. He will also evaluate the patient at 7 AM. I spoke with [facility] hospitalist, Dr. Jabier Mutton who agrees to admit the patient for further care.   Everlene Balls, MD 10/12/14 [phone]

## 2014-10-12 NOTE — Progress Notes (Signed)
Admitting provider paged.

## 2014-10-12 NOTE — H&P (Signed)
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 Barry Dienes s/p radiation last being on 7/5.  This was complicated by an abscess which was managed with a drain which was removed 09/26/14. The patient states he was in his usual state of health until Tuesday afternoon at which time he developed nausea, vomiting and bloating.  Symptoms are intermittent.  Moderate in severity.  Denies any significant abdominal pain. Denies fever, chills or sweats.  He is having bowel movements and passed flatus during my exam.  Oral intake has been poor.  Work up shows a normal white count.  Na 128.  K 3.  sCr 1.31.  H&h low but stable.  CT of abdomen and pelvis an increase of the left retroperitoneal abscess with multiple smaller abscesses which communicate with the larger one and a small bowel obstruction with transition zone in the left upper abdomen.  We have therefore been asked to evaluate.   He has remained stable overnight.  NGT with 1200 bilious output.  Passing flatus, but nauseated and hiccoughs.  Apparently he developed hiccoughs after his last radiation treatment and was seen by his PCP and was given thorazine which have helped.  He has been taking reglan and zofran before meals for nausea.   Past Medical History  Diagnosis Date  . Hypertension   . Elevated cholesterol   . Anginal pain     hx of CP saw Dr Wynonia Lawman 09/20/2013 H&P per chart   . Left Retroperitoneal mass s/p resection/L nephrectomy 07/13/2014   . Dedifferentiated liposarcoma   . 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 Postoperative anemia due to acute blood loss 07/15/2014  . SBO (small bowel obstruction) 08/01/2014  . Retroperitoneal fluid collection   . Protein-calorie malnutrition, severe 08/02/2014  . Thrombocytopenia  07/15/2014    Past Surgical History  Procedure Laterality Date  . Surgery for arm fracture Right 1952  . Knee arthroscopy Right 1970's  . Colonoscopy with propofol N/A 10/20/2013    Procedure: COLONOSCOPY WITH PROPOFOL;  Surgeon: Lear Ng, MD;  Location: [facility];  Service: Endoscopy;  Laterality: N/A;  . Hot hemostasis N/A 10/20/2013    Procedure: HOT HEMOSTASIS (ARGON PLASMA COAGULATION/BICAP);  Surgeon: Lear Ng, MD;  Location: [facility];  Service: Endoscopy;  Laterality: N/A;  . Tigger finger release      per H&P per Dr Wynonia Lawman 09/20/2013 (on chart)  . Resection of retroperitoneal mass Left 07/13/2014    Procedure: RESECTION OF left RETROPERITONEAL MASS;  Surgeon: Stark Klein, MD;  Location: [facility];  Service: General;  Laterality: Left;  . Esophagogastroduodenoscopy (egd) with propofol N/A 09/19/2014    Procedure: ESOPHAGOGASTRODUODENOSCOPY (EGD) WITH PROPOFOL;  Surgeon: Wilford Corner, MD;  Location: [facility];  Service: Endoscopy;  Laterality: N/A;    Family History  Problem Relation Age of Onset  . Adopted: Yes  . Family history unknown: Yes   Social History:  reports that he has never smoked. He has never used smokeless tobacco. He reports that he does not drink alcohol or use illicit drugs.  Allergies:  Allergies  Allergen Reactions  . Compazine [Prochlorperazine] Other (See Comments)    Made pt really lethargic, and sleepy    Medications Prior to Admission  Medication Sig Dispense Refill  . LORazepam (ATIVAN) 1 MG tablet Take  1 tablet by mouth 3 (three) times daily as needed for anxiety. Anxiety/nausea  0  . Melatonin 5 MG CAPS Take 1 capsule by mouth at bedtime.     . metoCLOPramide (REGLAN) 10 MG/10ML SOLN Take 10 mLs (10 mg total) by mouth 4 (four) times daily -  before meals and at bedtime. 473 mL 5  . Multiple Vitamin (MULTIVITAMIN WITH MINERALS) TABS tablet Take 1 tablet by mouth every morning.     . pantoprazole (PROTONIX) 40 MG  tablet Take 1 tablet (40 mg total) by mouth daily. 60 tablet 0  . traZODone (DESYREL) 50 MG tablet Take 50 mg by mouth at bedtime.     Marland Kitchen oxyCODONE-acetaminophen (PERCOCET/ROXICET) 5-325 MG per tablet Take 1-2 tablets by mouth every 6 (six) hours as needed for severe pain. 30 tablet 0    Results for orders placed or performed during the hospital encounter of 10/11/14 (from the past 48 hour(s))  Lipase, blood     Status: Abnormal   Collection Time: 10/11/14  5:05 PM  Result Value Ref Range   Lipase 17 (L) 22 - 51 U/L  Comprehensive metabolic panel     Status: Abnormal   Collection Time: 10/11/14  5:05 PM  Result Value Ref Range   Sodium 128 (L) 135 - 145 mmol/L   Potassium 3.0 (L) 3.5 - 5.1 mmol/L   Chloride 93 (L) 101 - 111 mmol/L   CO2 24 22 - 32 mmol/L   Glucose, Bld 125 (H) 65 - 99 mg/dL   BUN 22 (H) 6 - 20 mg/dL   Creatinine, Ser 1.31 (H) 0.61 - 1.24 mg/dL   Calcium 8.7 (L) 8.9 - 10.3 mg/dL   Total Protein 6.6 6.5 - 8.1 g/dL   Albumin 2.4 (L) 3.5 - 5.0 g/dL   AST 24 15 - 41 U/L   ALT 22 17 - 63 U/L   Alkaline Phosphatase 101 38 - 126 U/L   Total Bilirubin 0.5 0.3 - 1.2 mg/dL   GFR calc non Af Amer 54 (L) >60 mL/min   GFR calc Af Amer >60 >60 mL/min    Comment: (NOTE) The eGFR has been calculated using the CKD EPI equation. This calculation has not been validated in all clinical situations. eGFR's persistently <60 mL/min signify possible Chronic Kidney Disease.    Anion gap 11 5 - 15  CBC     Status: Abnormal   Collection Time: 10/11/14  5:05 PM  Result Value Ref Range   WBC 7.6 4.0 - 10.5 K/uL   RBC 3.11 (L) 4.22 - 5.81 MIL/uL   Hemoglobin 8.5 (L) 13.0 - 17.0 g/dL   HCT 25.9 (L) 39.0 - 52.0 %   MCV 83.3 78.0 - 100.0 fL   MCH 27.3 26.0 - 34.0 pg   MCHC 32.8 30.0 - 36.0 g/dL   RDW 16.4 (H) 11.5 - 15.5 %   Platelets 690 (H) 150 - 400 K/uL  I-Stat CG4 Lactic Acid, ED     Status: None   Collection Time: 10/11/14  5:15 PM  Result Value Ref Range   Lactic Acid, Venous  0.80 0.5 - 2.0 mmol/L  Urinalysis, Routine w reflex microscopic (not at [facility])     Status: Abnormal   Collection Time: 10/11/14  5:40 PM  Result Value Ref Range   Color, Urine YELLOW YELLOW   APPearance CLOUDY (A) CLEAR   Specific Gravity, Urine 1.023 1.005 - 1.030   pH 6.0 5.0 - 8.0   Glucose, UA NEGATIVE NEGATIVE mg/dL  Hgb urine dipstick NEGATIVE NEGATIVE   Bilirubin Urine NEGATIVE NEGATIVE   Ketones, ur NEGATIVE NEGATIVE mg/dL   Protein, ur 30 (A) NEGATIVE mg/dL   Urobilinogen, UA 1.0 0.0 - 1.0 mg/dL   Nitrite NEGATIVE NEGATIVE   Leukocytes, UA NEGATIVE NEGATIVE  Urine microscopic-add on     Status: Abnormal   Collection Time: 10/11/14  5:40 PM  Result Value Ref Range   Casts HYALINE CASTS (A) NEGATIVE   Urine-Other MUCOUS PRESENT   I-Stat CG4 Lactic Acid, ED     Status: None   Collection Time: 10/11/14  8:24 PM  Result Value Ref Range   Lactic Acid, Venous 0.72 0.5 - 2.0 mmol/L   Ct Abdomen Pelvis W Contrast  10/12/2014   CLINICAL DATA:  69 year old male with nausea and vomiting. History of bowel obstruction history of left retroperitoneal liposarcoma status post resection  EXAM: CT ABDOMEN AND PELVIS WITH CONTRAST  TECHNIQUE: Multidetector CT imaging of the abdomen and pelvis was performed using the standard protocol following bolus administration of intravenous contrast.  CONTRAST:  34m OMNIPAQUE IOHEXOL 300 MG/ML  SOLN  COMPARISON:  CT dated 08/29/2014  FINDINGS: Partially visualized small left pleural effusion. There is atelectatic changes of the left lung base. Pneumonia is not excluded. No intra-abdominal free air. There is diffuse mesenteric stranding and small perihepatic and right lower quadrant free fluid.  Subcentimeter hepatic hypodense lesion is too small to characterize. The liver is otherwise unremarkable. The gallbladder, pancreas, spleen, right adrenal gland appear unremarkable. Stable right renal hypodense lesion, likely a cyst. There is no hydronephrosis on the  right. The visualized right ureter and urinary bladder as well as the prostate and seminal vesicles are grossly unremarkable.  The left adrenal gland is not visualized. Postsurgical changes of left nephrectomy. There is a 7.2 x 4.0 cm complex fluid collection containing small pockets of air in the left retroperitoneum and along the left psoas muscle compatible with an abscess. There has been interval removal of the percutaneous drainage catheter with interval increase in size of this collection compared to the prior study. Multiple loculated fluid noted along the greater curvature of the stomach and superior to the pancreas compatible with multiloculated abscesses. There has been interval increase in the size of this abscess is compared to the prior study. The largest pocket measures approximately 4.3 x 2.6 cm (series 6, image 8). A tract like structure containing pockets of air noted extending from the abscesses along the stomach wall to the larger abscess in the left retroperitoneum along the psoas muscle.  An enteric tube is noted with tip in the stomach. There is dilatation of loops of proximal and mid small bowel. The distal small bowel and terminal ileum are collapsed. The transition zone is noted in the left upper abdomen (coronal series 5, image 48 and axial series 2, image 33) adjacent to the retroperitoneal abscess and surgical clips. This may be related to surrounding inflammation or adhesion. There is extensive colonic diverticulosis. There is focal inflammation and thickening of the upper descending colon adjacent to the retroperitoneal abscess, likely secondary to adjacent inflammation. The appendix appears unremarkable.  There is aortoiliac atherosclerotic disease. No portal venous gas. Midline vertical anterior abdominal wall incisional scar. Degenerative changes of the spine. No acute fracture.  IMPRESSION: Interval removal of the left posterior percutaneous drainage catheter with interval increase  in the size of the left retroperitoneal abscess. Multiple smaller abscesses noted along the posterior wall and greater curvature of the stomach which also have  increased in size compared to the prior study. These collections appear to communicate with the larger abscess in the left retroperitoneum along the psoas muscle.  Small-bowel obstruction with transition zone in the left upper abdomen adjacent to the retroperitoneal collection. The cause of the obstruction may be related to adjacent inflammation or adhesions.  Small left pleural effusion with partial left lower lobe atelectasis/pneumonia.   Electronically Signed   By: Anner Crete M.D.   On: 10/12/2014 04:00   Dg Abd Acute W/chest  10/12/2014   CLINICAL DATA:  Nausea and vomiting since yesterday. 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 Nonsmoker.  EXAM: DG ABDOMEN ACUTE W/ 1V CHEST  COMPARISON:  CT abdomen and pelvis 08/29/2014. Abdominal series [id number].  FINDINGS: Blunting of the left costophrenic angle without change since previous study, likely representing pleural thickening. Normal heart size and pulmonary vascularity. No focal airspace disease or consolidation in the lungs. No pneumothorax. Mediastinal contours appear intact.  Surgical clips in the left upper quadrant. Interval development of prominent gaseous distention of mid abdominal small bowel with paucity of gas in the colon. Multiple air-fluid levels. Findings suggest small bowel obstruction. No free intra-abdominal air. No radiopaque stones. Degenerative changes in the spine and hips.  IMPRESSION: Gaseous distention of small bowel with air-fluid levels consistent with small bowel obstruction.   Electronically Signed   By: Lucienne Capers M.D.   On: 10/12/2014 00:27    Review of Systems  All other systems reviewed and are negative.   Blood pressure 130/66, pulse 87, temperature 98 F (36.7 C), temperature source Oral, resp. rate 18, height 5' 8"  (1.727  m), weight 78.971 kg (174 lb 1.6 oz), SpO2 98 %. Physical Exam  Constitutional: He is oriented to person, place, and time. He appears well-developed. No distress.  Cardiovascular: Normal rate, regular rhythm, normal heart sounds and intact distal pulses.  Exam reveals no gallop and no friction rub.   No murmur heard. Respiratory: Effort normal and breath sounds normal. No respiratory distress. He has no wheezes. He has no rales. He exhibits no tenderness.  GI: Bowel sounds are normal. He exhibits no distension and no mass. There is no tenderness. There is no rebound and no guarding.  Musculoskeletal: Normal range of motion. He exhibits no edema or tenderness.  Neurological: He is alert and oriented to person, place, and time.  Skin: Skin is warm and dry. No rash noted. He is not diaphoretic. No erythema. No pallor.  Psychiatric: He has a normal mood and affect. His behavior is normal. Judgment and thought content normal.     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  Will continue with conservative management.  The patient understands he may need surgical intervention if he fails medical management.   -bowel rest, NGT to LIWS -IR to place drain.  Would like a culture and a amylase and lipase -give thorazine -zofran and reglan -will hold off on antibiotics given the patient is afebrile and has a normal white count.  Plan to start if cultures yield growth or the patient becomes afebrile.   Anemia of chronic disease-hgb down to 6.8.  Will transfuse with 2 units of PRBCs.  Hemodynamically stable.  dont think he's actively bleeding, perhaps was hemoconcentrated on previous labs.  Stop heparin.  Follow CBC.  Chronic renal insufficiency- Will give a fluid bolus.  Avoid nephrotoxins.  Labs in AM.    FEN-start D5NS with KCL.  K 3.1 give 59mq IV VTE prophylaxis-SCD/lovenox  Daden Mahany  ANP-BC  10/12/2014, 8:22 AM

## 2014-10-12 NOTE — H&P (Signed)
Chief Complaint: Chief Complaint  Patient presents with  . Nausea  . Emesis    Referring Physician(s): [facility]/ Emina Riebock  History of Present Illness:  Darryl Griffith is a 69 y.o. male who underwent a left nephrectomy and excision of retroperitoneal liposarcoma on 07/13/2014 by Dr. Barry Dienes.  This was complicated by development of a retroperitoneal abscess.    Dr. Pascal Lux placed a drain on 08/15/2014.   He then underwent a drain/fistula check by Dr. Pascal Lux on 09/26/2014. There was no evidence of fistulous connection to any adjacent organ and the drainage catheter was removed.  He had been doing well until Tuesday afternoon when he developed nausea, vomiting and bloating. Symptoms are intermittent. Moderate in severity. Denies any significant abdominal pain. Denies fever or chills.  CT of abdomen and pelvis shows an increase of the left retroperitoneal abscess with multiple smaller abscesses which communicate with the larger one and a small bowel obstruction with transition zone in the left upper abdomen  We are asked to evaluate patient for CT guided placement of drain into the abscess cavity.  Past Medical History  Diagnosis Date  . Hypertension   . Elevated cholesterol   . Anginal pain     hx of CP saw Dr Wynonia Lawman 09/20/2013 H&P per chart   . Left Retroperitoneal mass s/p resection/L nephrectomy 07/13/2014   . Dedifferentiated liposarcoma   . 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 Postoperative anemia due to acute blood loss 07/15/2014  . SBO (small bowel obstruction) 08/01/2014  . Retroperitoneal fluid collection   . Protein-calorie malnutrition, severe 08/02/2014  . Thrombocytopenia 07/15/2014    Past Surgical History  Procedure Laterality Date  . Surgery for arm fracture Right 1952  . Knee arthroscopy Right 1970's  . Colonoscopy with propofol N/A 10/20/2013    Procedure: COLONOSCOPY WITH PROPOFOL;   Surgeon: Lear Ng, MD;  Location: [facility];  Service: Endoscopy;  Laterality: N/A;  . Hot hemostasis N/A 10/20/2013    Procedure: HOT HEMOSTASIS (ARGON PLASMA COAGULATION/BICAP);  Surgeon: Lear Ng, MD;  Location: [facility];  Service: Endoscopy;  Laterality: N/A;  . Tigger finger release      per H&P per Dr Wynonia Lawman 09/20/2013 (on chart)  . Resection of retroperitoneal mass Left 07/13/2014    Procedure: RESECTION OF left RETROPERITONEAL MASS;  Surgeon: Stark Klein, MD;  Location: [facility];  Service: General;  Laterality: Left;  . Esophagogastroduodenoscopy (egd) with propofol N/A 09/19/2014    Procedure: ESOPHAGOGASTRODUODENOSCOPY (EGD) WITH PROPOFOL;  Surgeon: Wilford Corner, MD;  Location: [facility];  Service: Endoscopy;  Laterality: N/A;    Allergies: Compazine  Medications: Prior to Admission medications   Medication Sig Start Date End Date Taking? Authorizing Provider  LORazepam (ATIVAN) 1 MG tablet Take 1 tablet by mouth 3 (three) times daily as needed for anxiety. Anxiety/nausea 09/01/14  Yes Historical Provider, MD  Melatonin 5 MG CAPS Take 1 capsule by mouth at bedtime.  07/27/14  Yes Historical Provider, MD  metoCLOPramide (REGLAN) 10 MG/10ML SOLN Take 10 mLs (10 mg total) by mouth 4 (four) times daily -  before meals and at bedtime. 08/21/14  Yes Tyler Pita, MD  Multiple Vitamin (MULTIVITAMIN WITH MINERALS) TABS tablet Take 1 tablet by mouth every morning.    Yes Historical Provider, MD  pantoprazole (PROTONIX) 40 MG tablet Take 1 tablet (40 mg total) by mouth daily. 09/22/14  Yes Donne Hazel, MD  traZODone (DESYREL) 50 MG tablet Take 50 mg by mouth at bedtime.    Yes Historical Provider, MD  ondansetron (ZOFRAN ODT) 8 MG disintegrating tablet Take 1 tablet (8 mg total) by mouth every 8 (eight) hours as needed for nausea or vomiting. 10/11/14   Jola Schmidt, MD  oxyCODONE-acetaminophen (PERCOCET/ROXICET) 5-325 MG per tablet Take 1-2 tablets by mouth  every 6 (six) hours as needed for severe pain. 08/04/14   Stark Klein, MD     Family History  Problem Relation Age of Onset  . Adopted: Yes  . Family history unknown: Yes    History   Social History  . Marital Status: Married    Spouse Name: [name]  . Number of Children: 4  . Years of Education: N/A   Occupational History  . Retired Armed forces operational officer    Social History Main Topics  . Smoking status: Never Smoker   . Smokeless tobacco: Never Used  . Alcohol Use: No  . Drug Use: No  . Sexual Activity: Not Currently   Other Topics Concern  . None   Social History Narrative   Married.  Lives in [place] with wife.     Review of Systems  Constitutional: Positive for activity change, appetite change and fatigue. Negative for fever and chills.  Respiratory: Negative for cough, chest tightness and shortness of breath.   Gastrointestinal: Positive for nausea and vomiting. Negative for abdominal pain.  Musculoskeletal: Negative for back pain and arthralgias.  Skin: Negative.   Neurological: Negative.   Psychiatric/Behavioral: Negative.     Vital Signs: BP 130/66 mmHg  Pulse 87  Temp(Src) 98 F (36.7 C) (Oral)  Resp 18  Ht 5\' 8"  (1.727 m)  Wt 174 lb 1.6 oz (78.971 kg)  BMI 26.48 kg/m2  SpO2 98%  Physical Exam  Constitutional: He is oriented to person, place, and time. He appears well-developed and well-nourished.  HENT:  Head: Normocephalic and atraumatic.  Eyes: EOM are normal.  Neck: Normal range of motion. Neck supple.  Cardiovascular: Normal rate, regular rhythm and normal heart sounds.   No murmur heard. Pulmonary/Chest: Effort normal. He has wheezes.  Abdominal: Soft. Bowel sounds are normal. He exhibits no distension. There is no tenderness.  Musculoskeletal: Normal range of motion.  Neurological: He is alert and oriented to person, place, and time.  Skin: Skin is warm and dry.  Psychiatric: He has a normal mood and affect. His behavior is normal.  Judgment and thought content normal.  Vitals reviewed.   Mallampati Score:  MD Evaluation Airway: WNL Heart: WNL Abdomen: WNL Chest/ Lungs: WNL ASA  Classification: 3 Mallampati/Airway Score: Two  Imaging: Ct Abdomen Pelvis W Contrast  10/12/2014   CLINICAL DATA:  69 year old male with nausea and vomiting. History of bowel obstruction history of left retroperitoneal liposarcoma status post resection  EXAM: CT ABDOMEN AND PELVIS WITH CONTRAST  TECHNIQUE: Multidetector CT imaging of the abdomen and pelvis was performed using the standard protocol following bolus administration of intravenous contrast.  CONTRAST:  66mL OMNIPAQUE IOHEXOL 300 MG/ML  SOLN  COMPARISON:  CT dated 08/29/2014  FINDINGS: Partially visualized small left pleural effusion. There is atelectatic changes of the left lung base. Pneumonia is not excluded. No intra-abdominal free air. There is diffuse mesenteric stranding and small perihepatic and right lower quadrant free fluid.  Subcentimeter hepatic hypodense lesion is too small to characterize. The liver is otherwise unremarkable. The gallbladder, pancreas, spleen, right adrenal gland appear unremarkable. Stable right renal hypodense lesion, likely a cyst. There  is no hydronephrosis on the right. The visualized right ureter and urinary bladder as well as the prostate and seminal vesicles are grossly unremarkable.  The left adrenal gland is not visualized. Postsurgical changes of left nephrectomy. There is a 7.2 x 4.0 cm complex fluid collection containing small pockets of air in the left retroperitoneum and along the left psoas muscle compatible with an abscess. There has been interval removal of the percutaneous drainage catheter with interval increase in size of this collection compared to the prior study. Multiple loculated fluid noted along the greater curvature of the stomach and superior to the pancreas compatible with multiloculated abscesses. There has been interval increase  in the size of this abscess is compared to the prior study. The largest pocket measures approximately 4.3 x 2.6 cm (series 6, image 8). A tract like structure containing pockets of air noted extending from the abscesses along the stomach wall to the larger abscess in the left retroperitoneum along the psoas muscle.  An enteric tube is noted with tip in the stomach. There is dilatation of loops of proximal and mid small bowel. The distal small bowel and terminal ileum are collapsed. The transition zone is noted in the left upper abdomen (coronal series 5, image 48 and axial series 2, image 33) adjacent to the retroperitoneal abscess and surgical clips. This may be related to surrounding inflammation or adhesion. There is extensive colonic diverticulosis. There is focal inflammation and thickening of the upper descending colon adjacent to the retroperitoneal abscess, likely secondary to adjacent inflammation. The appendix appears unremarkable.  There is aortoiliac atherosclerotic disease. No portal venous gas. Midline vertical anterior abdominal wall incisional scar. Degenerative changes of the spine. No acute fracture.  IMPRESSION: Interval removal of the left posterior percutaneous drainage catheter with interval increase in the size of the left retroperitoneal abscess. Multiple smaller abscesses noted along the posterior wall and greater curvature of the stomach which also have increased in size compared to the prior study. These collections appear to communicate with the larger abscess in the left retroperitoneum along the psoas muscle.  Small-bowel obstruction with transition zone in the left upper abdomen adjacent to the retroperitoneal collection. The cause of the obstruction may be related to adjacent inflammation or adhesions.  Small left pleural effusion with partial left lower lobe atelectasis/pneumonia.   Electronically Signed   By: Anner Crete M.D.   On: 10/12/2014 04:00   Ir Sinus/fist Tube  Chk-non Gi  09/26/2014   CLINICAL DATA:  History of left-sided nephrectomy and retroperitoneal resection for grade 3 liposarcoma (07/13/2014) within knee left retroperitoneum. Postoperative course complicated by development of a fluid collection within the left nephrectomy spot retroperitoneal resection bed.  Patient initially underwent CT-guided percutaneous drainage catheter placement on 08/01/2014.  Subsequent CT imaging in fluoroscopic guided drainage catheter injection demonstrates an improved but residual left-sided retroperitoneal fluid collection.  Patient returns to the Interventional Radiology Clinic today for fluoroscopic evaluation of existing percutaneous drainage catheter.  Patient reports at there has been little to no output from the percutaneous drain during the past 2-3 days. Prior to this, the patient reports a steady decrease in output since the patient was last seen in interventional radiology drain Clinic on 08/29/2014.  EXAM: SINUS TRACT INJECTION/FISTULOGRAM  COMPARISON:  CT abdomen pelvis - 08/29/2014; 08/15/2014; fluoroscopic guided percutaneous drainage catheter injection - 08/29/2014; 08/15/2014  CONTRAST:  70mL OMNIPAQUE IOHEXOL 300 MG/ML SOLN - administered via the existing left-sided retroperitoneal percutaneous drainage catheter  FLUOROSCOPY TIME:  18 seconds  TECHNIQUE: The patient was positioned supine on the fluoroscopy table.  A preprocedural spot fluoroscopic image was obtained of the left upper abdominal quadrant and the existing percutaneous drainage catheter.  Multiple spot fluoroscopic and radiographic images were obtained following the injection of a small amount of contrast via the percutaneous drainage catheter.  Images were reviewed and discussed with referring physician Dr. Barry Dienes, and the decision was made to remove the percutaneous drainage catheter.  As such, the external portion of the percutaneous drainage catheter was cut and the percutaneous drain was removed  intact. A dressing was placed. The patient tolerated the procedure well without immediate postprocedural complication.  FINDINGS: Preprocedural spot fluoroscopic image demonstrates unchanged positioning of the percutaneous drainage catheter with end coiled and locked within the medial aspect the left retroperitoneum adjacent to multiple surgical clips.  Contrast injection demonstrates opacification of a resolved fluid collection within the left retroperitoneum with contrast opacification along the percutaneous drainage catheter tract to the level the skin surface.  There is no definitive fistulous connection with any adjacent organ.  IMPRESSION: Technically successful resolution of postoperative retroperitoneal fluid collection following CT-guided percutaneous drainage catheter placement. No evidence of fistulous connection to any adjacent organ.  Above was discussed with referring physician, Dr. Barry Dienes, and the percutaneous drainage catheter was removed.   Electronically Signed   By: Sandi Mariscal M.D.   On: 09/26/2014 16:38   Dg Abd Acute W/chest  10/12/2014   CLINICAL DATA:  Nausea and vomiting since yesterday. 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 Nonsmoker.  EXAM: DG ABDOMEN ACUTE W/ 1V CHEST  COMPARISON:  CT abdomen and pelvis 08/29/2014. Abdominal series [id number].  FINDINGS: Blunting of the left costophrenic angle without change since previous study, likely representing pleural thickening. Normal heart size and pulmonary vascularity. No focal airspace disease or consolidation in the lungs. No pneumothorax. Mediastinal contours appear intact.  Surgical clips in the left upper quadrant. Interval development of prominent gaseous distention of mid abdominal small bowel with paucity of gas in the colon. Multiple air-fluid levels. Findings suggest small bowel obstruction. No free intra-abdominal air. No radiopaque stones. Degenerative changes in the spine and hips.  IMPRESSION: Gaseous  distention of small bowel with air-fluid levels consistent with small bowel obstruction.   Electronically Signed   By: Lucienne Capers M.D.   On: 10/12/2014 00:27    Labs:  CBC:  Recent Labs  09/21/14 0441  09/21/14 1714 09/22/14 0353 10/11/14 1705 10/12/14 0845  WBC 2.7*  --   --  1.7* 7.6 6.4  HGB 7.3*  < > 7.1* 8.1* 8.5* 6.8*  HCT 22.2*  < > 21.1* 24.7* 25.9* 20.9*  PLT 251  --   --  246 690* 578*  < > = values in this interval not displayed.  COAGS:  Recent Labs  07/13/14 1939  07/14/14 1055 07/14/14 1830 07/15/14 0058 07/15/14 0515 07/16/14 0700 09/18/14 1400  INR 1.42  --  1.20  --   --   --  1.21 1.06  APTT  --   < > 33 36 31 33  --  30  < > = values in this interval not displayed.  BMP:  Recent Labs  09/19/14 0446 09/20/14 0419 10/11/14 1705 10/12/14 0845  NA 134* 135 128* 132*  K 4.3 4.0 3.0* 3.1*  CL 101 102 93* 100*  CO2 25 25 24 23   GLUCOSE 105* 105* 125* 102*  BUN 28* 25* 22* 21*  CALCIUM 8.4* 8.2* 8.7* 8.0*  CREATININE 1.20 1.15 1.31* 1.35*  GFRNONAA 60* >60 54* 52*  GFRAA >60 >60 >60 60*    LIVER FUNCTION TESTS:  Recent Labs  07/05/14 1350 09/18/14 0959 10/11/14 1705  BILITOT 0.6 <0.20 0.5  AST 21 17 24   ALT 20 12 22   ALKPHOS 58 65 101  PROT 7.5 5.3* 6.6  ALBUMIN 4.2 2.4* 2.4*    TUMOR MARKERS: No results for input(s): AFPTM, CEA, CA199, CHROMGRNA in the last 8760 hours.  Assessment and Plan:  Chart and CT scan reviewed by myself and by Dr. Laurence Ferrari.  Larger abscess appears amenable to CT guided drain placement, however, there are smaller collections behind the stomach that cannot be reached safely.    Dr. Laurence Ferrari recommends placement of PICC as well for prolonged antibiotic therapy.  I have spoken with Erby Pian, NP, and she agrees.  Will proceed with PICC placement and CT guided placement of drainage catheter by Dr. Laurence Ferrari.  Will hold sub-cu heparin until after procedure.  Risks and Benefits discussed  with the patient including bleeding, infection, damage to adjacent structures, bowel perforation/fistula connection, and sepsis. All of the patient's questions were answered, patient is agreeable to proceed. Consent signed and in chart.  Risks and Benefits discussed with the patient including, but not limited to bleeding, infection, DVT or fibrin sheath development and need for additional procedures. All of the patient's questions were answered, patient is agreeable to proceed. Consent signed and in chart.  Thank you for this interesting consult.  I greatly enjoyed meeting Krishay Faro and look forward to participating in their care.  Signed: Murrell Redden PA-C 10/12/2014, 9:54 AM   I spent a total of 40 Minutes   in face to face in clinical consultation, greater than 50% of which was counseling/coordinating care for image guided placement of PICC and CT guided placement of drainage catheter.

## 2014-10-12 NOTE — Progress Notes (Signed)
Initial Nutrition Assessment  DOCUMENTATION CODES:   Severe malnutrition in context of chronic illness  INTERVENTION:   If pt expected to remain NPO for >7 days, consider nutrition support. Diet advancement per MD When/if diet is advanced, recommend Boost Breeze po TID, each supplement provides 250 kcal and 9 grams of protein RD to continue to monitor  NUTRITION DIAGNOSIS:   Malnutrition related to chronic illness as evidenced by percent weight loss, energy intake < or equal to 75% for > or equal to 1 month, mild depletion of muscle mass.  GOAL:   Patient will meet greater than or equal to 90% of their needs  MONITOR:   Diet advancement, Labs, Weight trends, Skin, I & O's  REASON FOR ASSESSMENT:   Malnutrition Screening Tool    ASSESSMENT:   69 year old male with a history of left retroperitoneal liposarcoma s/p resection and left nephrectomy 07/13/14 with positive margins by Dr. Barry Dienes s/p radiation last being on 7/5. This was complicated by an abscess which was managed with a drain which was removed 09/26/14. The patient states he was in his usual state of health until Tuesday afternoon at which time he developed nausea, vomiting and bloating.  Pt in room with wife at bedside. Pt with NGT for suction. Pt's wife reports pt had been eating and drinking Boost Breeze supplements at home until 7/12 when he started having N/V. Pt currently NPO for bowel rest. If diet is advanced, pt would like peach Boost Breeze TID.  Pt saw [place] 6/27.   Pt continues to lose weight (16% weight loss x 3 months, significant for time frame).  Nutrition-Focused physical exam completed. Findings are no fat depletion, moderate muscle depletion, and no edema.   Labs reviewed: Low Na, K Elevated BUN & Creatinine  Diet Order:  Diet NPO time specified  Skin:  Reviewed, no issues  Last BM:  7/13  Height:   Ht Readings from Last 1 Encounters:  10/12/14 5\' 8"  (1.727 m)    Weight:    Wt Readings from Last 1 Encounters:  10/12/14 174 lb 1.6 oz (78.971 kg)    Ideal Body Weight:  70 kg  Wt Readings from Last 10 Encounters:  10/12/14 174 lb 1.6 oz (78.971 kg)  09/28/14 174 lb 8 oz (79.153 kg)  09/18/14 178 lb 9.6 oz (81.012 kg)  09/18/14 176 lb 11.2 oz (80.151 kg)  09/15/14 176 lb 4.8 oz (79.969 kg)  09/07/14 177 lb 12.8 oz (80.65 kg)  09/01/14 179 lb 1.6 oz (81.239 kg)  08/25/14 179 lb 14.4 oz (81.602 kg)  08/17/14 183 lb 12.8 oz (83.371 kg)  08/07/14 188 lb 3.2 oz (85.367 kg)    BMI:  Body mass index is 26.48 kg/(m^2).  Estimated Nutritional Needs:   Kcal:  9563-8756  Protein:  110-120g  Fluid:  2.3L/day  EDUCATION NEEDS:   No education needs identified at this time  Clayton Bibles, MS, RD, LDN Pager: [phone] After Hours Pager: [phone]

## 2014-10-13 ENCOUNTER — Encounter (HOSPITAL_COMMUNITY): Payer: Self-pay | Admitting: General Surgery

## 2014-10-13 ENCOUNTER — Inpatient Hospital Stay (HOSPITAL_COMMUNITY): Payer: Medicare Other

## 2014-10-13 DIAGNOSIS — K651 Peritoneal abscess: Secondary | ICD-10-CM

## 2014-10-13 DIAGNOSIS — B9689 Other specified bacterial agents as the cause of diseases classified elsewhere: Secondary | ICD-10-CM

## 2014-10-13 LAB — TYPE AND SCREEN
ABO/RH(D): O NEG
ANTIBODY SCREEN: NEGATIVE
UNIT DIVISION: 0
Unit division: 0

## 2014-10-13 LAB — CBC
HCT: 27.9 % — ABNORMAL LOW (ref 39.0–52.0)
Hemoglobin: 9.4 g/dL — ABNORMAL LOW (ref 13.0–17.0)
MCH: 28.6 pg (ref 26.0–34.0)
MCHC: 33.7 g/dL (ref 30.0–36.0)
MCV: 84.8 fL (ref 78.0–100.0)
Platelets: 599 10*3/uL — ABNORMAL HIGH (ref 150–400)
RBC: 3.29 MIL/uL — ABNORMAL LOW (ref 4.22–5.81)
RDW: 15.9 % — AB (ref 11.5–15.5)
WBC: 6.2 10*3/uL (ref 4.0–10.5)

## 2014-10-13 LAB — BASIC METABOLIC PANEL
Anion gap: 5 (ref 5–15)
BUN: 19 mg/dL (ref 6–20)
CO2: 23 mmol/L (ref 22–32)
Calcium: 7.9 mg/dL — ABNORMAL LOW (ref 8.9–10.3)
Chloride: 107 mmol/L (ref 101–111)
Creatinine, Ser: 1.27 mg/dL — ABNORMAL HIGH (ref 0.61–1.24)
GFR calc Af Amer: >60 mL/min (ref >60)
GFR calc non Af Amer: 56 mL/min — ABNORMAL LOW (ref >60)
Glucose, Bld: 111 mg/dL — ABNORMAL HIGH (ref 65–99)
Potassium: 3.5 mmol/L (ref 3.5–5.1)
SODIUM: 135 mmol/L (ref 135–145)

## 2014-10-13 LAB — IRON AND TIBC
Iron: 9 ug/dL — ABNORMAL LOW (ref 45–182)
SATURATION RATIOS: 5 % — AB (ref 17.9–39.5)
TIBC: 165 ug/dL — ABNORMAL LOW (ref 250–450)
UIBC: 156 ug/dL

## 2014-10-13 LAB — TRIGLYCERIDES: Triglycerides: 84 mg/dL (ref <150)

## 2014-10-13 MED ORDER — SODIUM CHLORIDE 0.9 % IV SOLN
1.0000 g | Freq: Every day | INTRAVENOUS | Status: DC
  Administered 2014-10-13 – 2014-10-20 (×8): 1 g via INTRAVENOUS
  Filled 2014-10-13 (×8): qty 1

## 2014-10-13 NOTE — Consult Note (Signed)
[facility]     Reason for Consult: abdominal abscess    Referring Physician: CCS  Active Problems:   Abdominal abscess   Small bowel obstruction   Intra-abdominal fluid collection   . metoCLOPramide (REGLAN) injection  10 mg Intravenous 3 times per day  . pantoprazole (PROTONIX) IV  40 mg Intravenous QHS  . piperacillin-tazobactam (ZOSYN)  IV  3.375 g Intravenous Q8H  . traZODone  50 mg Oral QHS    Recommendations: Ertapenem 1 gram daily May change based on culture results Could instead use ceftriaxone 2 grams daily and oral flagyl 500 mg tid (if taking po) if cost is an issue for the patient at discharge  Dr. Megan Salon will follow the cultures and make final recs depending on culture over the weekend.    Assessment: He has intraabdominal abscess based on aspiration of thick purulent material.  Gram stain and culture pending.  Previous culture of fluid was without WBCs.    Antibiotics: Zosyn day 2  HPI: Darryl Griffith is a 69 y.o. 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  CT also noted fluid collection 4 x 9 x 10 cm and was drained with dark fluid.  Culture without WBCs and no growth and drain remained in.  He continued to have drainage and small amount of amylase noted.  Drainage stopped and removed on 09/26/14.  He though presented again yesterday with nausea, vomiting and bloating with significant pain.  CT abdomen revealed an increase in the retroperitoneal abscess and several small abscesses which seem to communicate.  He underwent another drain placement yesterday and is purulent appearing with no results yet.     Review of Systems: A comprehensive review of systems was negative.  Past Medical History  Diagnosis Date  . Hypertension   . Elevated cholesterol     . Anginal pain     hx of CP saw Dr Wynonia Lawman 09/20/2013 H&P per chart   . Left Retroperitoneal mass s/p resection/L nephrectomy 07/13/2014   . Postoperative anemia due to acute blood loss 07/15/2014  . SBO (small bowel obstruction) 08/01/2014, 10/11/14  . Retroperitoneal fluid collection   . 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 Smoking status: Never Smoker   . Smokeless tobacco: Never Used  . Alcohol Use: No    Family History  Problem Relation Age of Onset  . Adopted: Yes  . Family history unknown: Yes   Allergies  Allergen Reactions  . Compazine [Prochlorperazine] Other (See Comments)    Made pt really lethargic, and sleepy    OBJECTIVE: Blood pressure 139/71, pulse 98, temperature 99.5 F (37.5 C), temperature source Oral, resp. rate 16, height 5\' 8"  (1.727 m), weight 174 lb 1.6 oz (78.971 kg), SpO2 98 %. General: awake, alert, NGT in place Skin: no rasehs Lungs: CTA B Cor: RRR Abdomen: soft, tender diffusely  Microbiology: Recent Results (from the past 240 hour(s))  Culture, blood (routine x 2)     Status: None (Preliminary result)   Collection Time: 10/12/14  5:15 AM  Result Value Ref Range Status   Specimen Description BLOOD RIGHT FOREARM  Final   Special Requests BOTTLES DRAWN AEROBIC AND ANAEROBIC 5ML  Final   Culture  Final    NO GROWTH 1 DAY Performed at [facility]    Report Status PENDING  Incomplete  Culture, blood (routine x 2)     Status: None (Preliminary result)   Collection Time: 10/12/14  5:25 AM  Result Value Ref Range Status   Specimen Description BLOOD RIGHT ANTECUBITAL  Final   Special Requests BOTTLES DRAWN AEROBIC AND ANAEROBIC 5ML  Final   Culture   Final    NO GROWTH 1 DAY Performed at [facility]    Report Status PENDING  Incomplete  Culture, routine-abscess     Status: None (Preliminary result)   Collection  Time: 10/12/14  3:21 PM  Result Value Ref Range Status   Specimen Description Retroperiton  Final   Special Requests Normal  Final   Gram Stain PENDING  Incomplete   Culture   Final    NO GROWTH 1 DAY Performed at [facility]    Report Status PENDING  Incomplete  Anaerobic culture     Status: None (Preliminary result)   Collection Time: 10/12/14  3:21 PM  Result Value Ref Range Status   Specimen Description ABSCESS  Final   Special Requests Normal  Final   Gram Stain PENDING  Incomplete   Culture   Final    NO ANAEROBES ISOLATED; CULTURE IN PROGRESS FOR 5 DAYS Performed at [facility]    Report Status PENDING  Incomplete  Fungus culture w smear     Status: None (Preliminary result)   Collection Time: 10/12/14  3:21 PM  Result Value Ref Range Status   Specimen Description ABSCESS  Final   Special Requests Normal  Final   Fungal Smear   Final    NO YEAST OR FUNGAL ELEMENTS SEEN Performed at [facility]    Culture   Final    CULTURE IN PROGRESS FOR FOUR WEEKS Performed at [facility]    Report Status PENDING  Incomplete    COMER, Darryl Griffith, [facility] [email or web address] [phone] pager  [phone] cell 10/13/2014, 5:53 PM

## 2014-10-13 NOTE — Progress Notes (Signed)
[facility]  Patient Status: New  [facility] is providing the following services: RN and Home Infusion Services (teaching and education will be done by nurse in the home with patient and caregiver)  If patient discharges after hours, please call [phone].   Darryl Griffith 10/13/2014, 3:44 PM

## 2014-10-13 NOTE — Progress Notes (Signed)
Patient ID: Darryl Griffith, male   DOB: Oct 16, 1945, 69 y.o.   MRN: [id number]     [facility]      [address] [place], [place] [address]    Phone: [phone] FAX: [phone]     Subjective: Passing flatus.  No n/v.  VSS.  Afebrile.  H&h up, appropriate response to tx.  Afebrile.    Objective:  Vital signs:  Filed Vitals:   10/12/14 2229 10/12/14 2303 10/13/14 0130 10/13/14 0513  BP: 121/70 131/69 132/69 126/68  Pulse: 99 103 99 99  Temp: 98.3 F (36.8 C) 99.1 F (37.3 C) 98.6 F (37 C) 98.4 F (36.9 C)  TempSrc: Oral Oral Oral Oral  Resp: _0 Height:      Weight:      SpO2: 99% 97% 98% 97%    Last BM Date: 10/11/14  Intake/Output   Yesterday:  07/14 0701 - 07/15 0700 In: 1600 [Blood:1005; NG/GT:120; IV Piggyback:475] Out: 2315 [Urine:825; Emesis/NG output:1350; Drains:140] This shift:  Total I/O In: -  Out: 10 [Drains:10]    Physical Exam: General: Pt awake/alert/oriented x4 in no acute distress Chest: cta.  No chest wall pain w good excursion CV:  Pulses intact.  Regular rhythm Abdomen: Soft.  Nondistended. Non tender.  No evidence of peritonitis.  No incarcerated hernias. Ext:  SCDs BLE.  No mjr edema.  No cyanosis Skin: No petechiae / purpura   Problem List:   Active Problems:   Abdominal abscess   Small bowel obstruction   Intra-abdominal fluid collection    Results:   Labs: Results for orders placed or performed during the hospital encounter of 10/11/14 (from the past 48 hour(s))  Lipase, blood     Status: Abnormal   Collection Time: 10/11/14  5:05 PM  Result Value Ref Range   Lipase 17 (L) 22 - 51 U/L  Comprehensive metabolic panel     Status: Abnormal   Collection Time: 10/11/14  5:05 PM  Result Value Ref Range   Sodium 128 (L) 135 - 145 mmol/L   Potassium 3.0 (L) 3.5 - 5.1 mmol/L   Chloride 93 (L) 101 - 111 mmol/L   CO2 24 22 - 32 mmol/L   Glucose, Bld 125 (H) 65 - 99  mg/dL   BUN 22 (H) 6 - 20 mg/dL   Creatinine, Ser 1.31 (H) 0.61 - 1.24 mg/dL   Calcium 8.7 (L) 8.9 - 10.3 mg/dL   Total Protein 6.6 6.5 - 8.1 g/dL   Albumin 2.4 (L) 3.5 - 5.0 g/dL   AST 24 15 - 41 U/L   ALT 22 17 - 63 U/L   Alkaline Phosphatase 101 38 - 126 U/L   Total Bilirubin 0.5 0.3 - 1.2 mg/dL   GFR calc non Af Amer 54 (L) >60 mL/min   GFR calc Af Amer >60 >60 mL/min    Comment: (NOTE) The eGFR has been calculated using the CKD EPI equation. This calculation has not been validated in all clinical situations. eGFR's persistently <60 mL/min signify possible Chronic Kidney Disease.    Anion gap 11 5 - 15  CBC     Status: Abnormal   Collection Time: 10/11/14  5:05 PM  Result Value Ref Range   WBC 7.6 4.0 - 10.5 K/uL   RBC 3.11 (L) 4.22 - 5.81 MIL/uL   Hemoglobin 8.5 (L) 13.0 - 17.0 g/dL   HCT 25.9 (L) 39.0 - 52.0 %   MCV 83.3  78.0 - 100.0 fL   MCH 27.3 26.0 - 34.0 pg   MCHC 32.8 30.0 - 36.0 g/dL   RDW 16.4 (H) 11.5 - 15.5 %   Platelets 690 (H) 150 - 400 K/uL  I-Stat CG4 Lactic Acid, ED     Status: None   Collection Time: 10/11/14  5:15 PM  Result Value Ref Range   Lactic Acid, Venous 0.80 0.5 - 2.0 mmol/L  Urinalysis, Routine w reflex microscopic (not at [facility])     Status: Abnormal   Collection Time: 10/11/14  5:40 PM  Result Value Ref Range   Color, Urine YELLOW YELLOW   APPearance CLOUDY (A) CLEAR   Specific Gravity, Urine 1.023 1.005 - 1.030   pH 6.0 5.0 - 8.0   Glucose, UA NEGATIVE NEGATIVE mg/dL   Hgb urine dipstick NEGATIVE NEGATIVE   Bilirubin Urine NEGATIVE NEGATIVE   Ketones, ur NEGATIVE NEGATIVE mg/dL   Protein, ur 30 (A) NEGATIVE mg/dL   Urobilinogen, UA 1.0 0.0 - 1.0 mg/dL   Nitrite NEGATIVE NEGATIVE   Leukocytes, UA NEGATIVE NEGATIVE  Urine microscopic-add on     Status: Abnormal   Collection Time: 10/11/14  5:40 PM  Result Value Ref Range   Casts HYALINE CASTS (A) NEGATIVE   Urine-Other MUCOUS PRESENT   I-Stat CG4 Lactic Acid, ED     Status: None    Collection Time: 10/11/14  8:24 PM  Result Value Ref Range   Lactic Acid, Venous 0.72 0.5 - 2.0 mmol/L  CBC     Status: Abnormal   Collection Time: 10/12/14  8:45 AM  Result Value Ref Range   WBC 6.4 4.0 - 10.5 K/uL   RBC 2.54 (L) 4.22 - 5.81 MIL/uL   Hemoglobin 6.8 (LL) 13.0 - 17.0 g/dL    Comment: REPEATED TO VERIFY CRITICAL RESULT CALLED TO, READ BACK BY AND VERIFIED WITH: Aliene Altes RN AT 0935 07.14.2016 BY [email or web address]    HCT 20.9 (L) 39.0 - 52.0 %   MCV 82.3 78.0 - 100.0 fL   MCH 26.8 26.0 - 34.0 pg   MCHC 32.5 30.0 - 36.0 g/dL   RDW 16.3 (H) 11.5 - 15.5 %   Platelets 578 (H) 150 - 400 K/uL  Basic metabolic panel     Status: Abnormal   Collection Time: 10/12/14  8:45 AM  Result Value Ref Range   Sodium 132 (L) 135 - 145 mmol/L   Potassium 3.1 (L) 3.5 - 5.1 mmol/L   Chloride 100 (L) 101 - 111 mmol/L   CO2 23 22 - 32 mmol/L   Glucose, Bld 102 (H) 65 - 99 mg/dL   BUN 21 (H) 6 - 20 mg/dL   Creatinine, Ser 1.35 (H) 0.61 - 1.24 mg/dL   Calcium 8.0 (L) 8.9 - 10.3 mg/dL   GFR calc non Af Amer 52 (L) >60 mL/min   GFR calc Af Amer 60 (L) >60 mL/min    Comment: (NOTE) The eGFR has been calculated using the CKD EPI equation. This calculation has not been validated in all clinical situations. eGFR's persistently <60 mL/min signify possible Chronic Kidney Disease.    Anion gap 9 5 - 15  Type and screen     Status: None (Preliminary result)   Collection Time: 10/12/14  4:00 PM  Result Value Ref Range   ABO/RH(D) O NEG    Antibody Screen NEG    Sample Expiration 10/15/2014    Unit Number [id number]    Blood Component Type RED CELLS,LR    Unit  division 00    Status of Unit ISSUED    Transfusion Status OK TO TRANSFUSE    Crossmatch Result Compatible    Unit Number [id number]    Blood Component Type RED CELLS,LR    Unit division 00    Status of Unit ISSUED    Transfusion Status OK TO TRANSFUSE    Crossmatch Result Compatible   Basic metabolic panel     Status:  Abnormal   Collection Time: 10/13/14  5:10 AM  Result Value Ref Range   Sodium 135 135 - 145 mmol/L   Potassium 3.5 3.5 - 5.1 mmol/L   Chloride 107 101 - 111 mmol/L   CO2 23 22 - 32 mmol/L   Glucose, Bld 111 (H) 65 - 99 mg/dL   BUN 19 6 - 20 mg/dL   Creatinine, Ser 1.27 (H) 0.61 - 1.24 mg/dL   Calcium 7.9 (L) 8.9 - 10.3 mg/dL   GFR calc non Af Amer 56 (L) >60 mL/min   GFR calc Af Amer >60 >60 mL/min    Comment: (NOTE) The eGFR has been calculated using the CKD EPI equation. This calculation has not been validated in all clinical situations. eGFR's persistently <60 mL/min signify possible Chronic Kidney Disease.    Anion gap 5 5 - 15  CBC     Status: Abnormal   Collection Time: 10/13/14  5:10 AM  Result Value Ref Range   WBC 6.2 4.0 - 10.5 K/uL   RBC 3.29 (L) 4.22 - 5.81 MIL/uL   Hemoglobin 9.4 (L) 13.0 - 17.0 g/dL    Comment: RESULT REPEATED AND VERIFIED DELTA CHECK NOTED POST TRANSFUSION SPECIMEN    HCT 27.9 (L) 39.0 - 52.0 %   MCV 84.8 78.0 - 100.0 fL   MCH 28.6 26.0 - 34.0 pg   MCHC 33.7 30.0 - 36.0 g/dL   RDW 15.9 (H) 11.5 - 15.5 %   Platelets 599 (H) 150 - 400 K/uL    Imaging / Studies: Ct Abdomen Pelvis W Contrast  10/12/2014   CLINICAL DATA:  69 year old male with nausea and vomiting. History of bowel obstruction history of left retroperitoneal liposarcoma status post resection  EXAM: CT ABDOMEN AND PELVIS WITH CONTRAST  TECHNIQUE: Multidetector CT imaging of the abdomen and pelvis was performed using the standard protocol following bolus administration of intravenous contrast.  CONTRAST:  73m OMNIPAQUE IOHEXOL 300 MG/ML  SOLN  COMPARISON:  CT dated 08/29/2014  FINDINGS: Partially visualized small left pleural effusion. There is atelectatic changes of the left lung base. Pneumonia is not excluded. No intra-abdominal free air. There is diffuse mesenteric stranding and small perihepatic and right lower quadrant free fluid.  Subcentimeter hepatic hypodense lesion is too  small to characterize. The liver is otherwise unremarkable. The gallbladder, pancreas, spleen, right adrenal gland appear unremarkable. Stable right renal hypodense lesion, likely a cyst. There is no hydronephrosis on the right. The visualized right ureter and urinary bladder as well as the prostate and seminal vesicles are grossly unremarkable.  The left adrenal gland is not visualized. Postsurgical changes of left nephrectomy. There is a 7.2 x 4.0 cm complex fluid collection containing small pockets of air in the left retroperitoneum and along the left psoas muscle compatible with an abscess. There has been interval removal of the percutaneous drainage catheter with interval increase in size of this collection compared to the prior study. Multiple loculated fluid noted along the greater curvature of the stomach and superior to the pancreas compatible with multiloculated abscesses. There has  been interval increase in the size of this abscess is compared to the prior study. The largest pocket measures approximately 4.3 x 2.6 cm (series 6, image 8). A tract like structure containing pockets of air noted extending from the abscesses along the stomach wall to the larger abscess in the left retroperitoneum along the psoas muscle.  An enteric tube is noted with tip in the stomach. There is dilatation of loops of proximal and mid small bowel. The distal small bowel and terminal ileum are collapsed. The transition zone is noted in the left upper abdomen (coronal series 5, image 48 and axial series 2, image 33) adjacent to the retroperitoneal abscess and surgical clips. This may be related to surrounding inflammation or adhesion. There is extensive colonic diverticulosis. There is focal inflammation and thickening of the upper descending colon adjacent to the retroperitoneal abscess, likely secondary to adjacent inflammation. The appendix appears unremarkable.  There is aortoiliac atherosclerotic disease. No portal venous  gas. Midline vertical anterior abdominal wall incisional scar. Degenerative changes of the spine. No acute fracture.  IMPRESSION: Interval removal of the left posterior percutaneous drainage catheter with interval increase in the size of the left retroperitoneal abscess. Multiple smaller abscesses noted along the posterior wall and greater curvature of the stomach which also have increased in size compared to the prior study. These collections appear to communicate with the larger abscess in the left retroperitoneum along the psoas muscle.  Small-bowel obstruction with transition zone in the left upper abdomen adjacent to the retroperitoneal collection. The cause of the obstruction may be related to adjacent inflammation or adhesions.  Small left pleural effusion with partial left lower lobe atelectasis/pneumonia.   Electronically Signed   By: Anner Crete M.D.   On: 10/12/2014 04:00   Ir Fluoro Guide Cv Line Right  10/12/2014   INDICATION: Retroperitoneal Abscess: Need for Central Venous Access for Prolonged Intravenous Antibiotics  EXAM: ULTRASOUND AND FLUOROSCOPIC GUIDED PICC LINE INSERTION  MEDICATIONS: None.  CONTRAST:  None  FLUOROSCOPY TIME:  6 seconds.  COMPLICATIONS: None immediate  TECHNIQUE: The procedure, risks, benefits, and alternatives were explained to the patient and informed written consent was obtained. A timeout was performed prior to the initiation of the procedure.  The right upper extremity was prepped with chlorhexidine in a sterile fashion, and a sterile drape was applied covering the operative field. Maximum barrier sterile technique with sterile gowns and gloves were used for the procedure. A timeout was performed prior to the initiation of the procedure. Local anesthesia was provided with 1% lidocaine.  Under direct ultrasound guidance, the right brachial vein was accessed with a micropuncture kit after the overlying soft tissues were anesthetized with 1% lidocaine. An ultrasound  image was saved for documentation purposes. A guidewire was advanced to the level of the superior caval-atrial junction for measurement purposes and the PICC line was cut to length. A peel-away sheath was placed and a 39 cm, 5 [place], dual lumen was inserted to level of the superior caval-atrial junction. A post procedure spot fluoroscopic was obtained. The catheter easily aspirated and flushed and was sutured in place. A dressing was placed. The patient tolerated the procedure well without immediate post procedural complication.  FINDINGS: After catheter placement, the tip lies within the superior cavoatrial junction. The catheter aspirates and flushes normally and is ready for immediate use.  IMPRESSION: Successful ultrasound and fluoroscopic guided placement of a right brachial vein approach, 39 cm, 5 French, dual lumen PICC with tip at the superior  caval-atrial junction. The PICC line is ready for immediate use.  Read By:  Gareth Eagle, PA-C   Electronically Signed   By: Jacqulynn Cadet M.D.   On: 10/12/2014 13:50   Ir US Guide Vasc Access Right  10/12/2014   INDICATION: Retroperitoneal Abscess: Need for Central Venous Access for Prolonged Intravenous Antibiotics  EXAM: ULTRASOUND AND FLUOROSCOPIC GUIDED PICC LINE INSERTION  MEDICATIONS: None.  CONTRAST:  None  FLUOROSCOPY TIME:  6 seconds.  COMPLICATIONS: None immediate  TECHNIQUE: The procedure, risks, benefits, and alternatives were explained to the patient and informed written consent was obtained. A timeout was performed prior to the initiation of the procedure.  The right upper extremity was prepped with chlorhexidine in a sterile fashion, and a sterile drape was applied covering the operative field. Maximum barrier sterile technique with sterile gowns and gloves were used for the procedure. A timeout was performed prior to the initiation of the procedure. Local anesthesia was provided with 1% lidocaine.  Under direct ultrasound guidance, the right  brachial vein was accessed with a micropuncture kit after the overlying soft tissues were anesthetized with 1% lidocaine. An ultrasound image was saved for documentation purposes. A guidewire was advanced to the level of the superior caval-atrial junction for measurement purposes and the PICC line was cut to length. A peel-away sheath was placed and a 39 cm, 5 [place], dual lumen was inserted to level of the superior caval-atrial junction. A post procedure spot fluoroscopic was obtained. The catheter easily aspirated and flushed and was sutured in place. A dressing was placed. The patient tolerated the procedure well without immediate post procedural complication.  FINDINGS: After catheter placement, the tip lies within the superior cavoatrial junction. The catheter aspirates and flushes normally and is ready for immediate use.  IMPRESSION: Successful ultrasound and fluoroscopic guided placement of a right brachial vein approach, 39 cm, 5 French, dual lumen PICC with tip at the superior caval-atrial junction. The PICC line is ready for immediate use.  Read By:  Gareth Eagle, PA-C   Electronically Signed   By: Jacqulynn Cadet M.D.   On: 10/12/2014 13:50   Dg Abd Acute W/chest  10/12/2014   CLINICAL DATA:  Nausea and vomiting since yesterday. 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 Nonsmoker.  EXAM: DG ABDOMEN ACUTE W/ 1V CHEST  COMPARISON:  CT abdomen and pelvis 08/29/2014. Abdominal series [id number].  FINDINGS: Blunting of the left costophrenic angle without change since previous study, likely representing pleural thickening. Normal heart size and pulmonary vascularity. No focal airspace disease or consolidation in the lungs. No pneumothorax. Mediastinal contours appear intact.  Surgical clips in the left upper quadrant. Interval development of prominent gaseous distention of mid abdominal small bowel with paucity of gas in the colon. Multiple air-fluid levels. Findings suggest small bowel  obstruction. No free intra-abdominal air. No radiopaque stones. Degenerative changes in the spine and hips.  IMPRESSION: Gaseous distention of small bowel with air-fluid levels consistent with small bowel obstruction.   Electronically Signed   By: Lucienne Capers M.D.   On: 10/12/2014 00:27   Ct Image Guided Drainage By Percutaneous Catheter  10/12/2014   CLINICAL DATA:  69 year-old male who underwent a left nephrectomy and excision of retroperitoneal liposarcoma on 07/13/2014 by Dr. Barry Dienes. This was complicated by development of a retroperitoneal abscess. Dr. Pascal Lux placed a drain on 08/15/2014. Patient then underwent a drain/fistula check by Dr. Pascal Lux on 09/26/2014. There was no evidence of fistulous connection to any adjacent  organ and the drainage catheter was removed.He had been doing well until Tuesday afternoon when he developed nausea, vomiting and bloating. CT shows an increase of the left retroperitoneal abscess with multiple smaller abscesses which communicate with the larger one and a small bowel obstruction with transition zone in the left upper abdomen. Patient presents for replacement of percutaneous drainage catheter.  EXAM: CT IMAGE GUIDED DRAINAGE BY PERCUTANEOUS CATHETER  Date: 10/12/2014  PROCEDURE: 1. Placement of a 12 French drainage catheter modified with additional sideholes into the left retroperitoneal abscess Interventional Radiologist:  Criselda Peaches, MD  ANESTHESIA/SEDATION: Moderate (conscious) sedation was used. 4 mg Versed, 50 mcg Fentanyl were administered intravenously. The patient's vital signs were monitored continuously by radiology nursing throughout the procedure.  Sedation Time: 33 minutes  MEDICATIONS: None additional  TECHNIQUE: Informed consent was obtained from the patient following explanation of the procedure, risks, benefits and alternatives. The patient understands, agrees and consents for the procedure. All questions were addressed. A time out was performed.  A  planning axial CT scan was performed. The complex fluid and gas collection in the left retroperitoneal space was successfully identified. A suitable entry site just medial and inferior to the left twelfth rib was selected. The skin was marked. The region was then sterilely prepped and draped in standard fashion using Betadine skin prep. Local anesthesia was attained by infiltration with 1% lidocaine. A small dermatotomy was made. Under intermittent CT fluoroscopic guidance, an 18 gauge trocar needle was advanced into the inferior aspect of the left retroperitoneal fluid collection. A short Amplatz wire was then advanced cranially within the fluid collection. The skin tract was then dilated and a Cook 12 [place] all-purpose drainage catheter modified with additional sideholes was advanced over the wire and formed within the abscess cavity. Aspiration yields approximately 100 mL thick, purulent material.  Post aspiration CT imaging demonstrates resolution of fluid, however there is a persistent thick walled cavity containing air which has not collapsed around the catheter. The catheter was secured to the skin with 0 Prolene suture and connected to JP bulb suction.  COMPLICATIONS: None  Estimated blood loss: 0  IMPRESSION: 1. Successful placement of a 12 French drainage catheter modified with additional sideholes into the recurrent left retroperitoneal abscess cavity. Aspiration yields 100 mL thick, purulent material. Samples were sent for culture, anaerobic culture and fungal culture. 2. Following aspiration, the thick walled cavity does not collapse and remains present as an air containing potential space.  Signed,  Criselda Peaches, MD  [facility]  [facility]   Electronically Signed   By: Jacqulynn Cadet M.D.   On: 10/12/2014 17:02    Medications / Allergies:  Scheduled Meds: . metoCLOPramide (REGLAN) injection  10 mg Intravenous 3 times per day  .  pantoprazole (PROTONIX) IV  40 mg Intravenous QHS  . piperacillin-tazobactam (ZOSYN)  IV  3.375 g Intravenous Q8H  . traZODone  50 mg Oral QHS   Continuous Infusions: . dextrose 5 % and 0.9 % NaCl with KCl 20 mEq/L 100 mL/hr at 10/12/14 2006   PRN Meds:.chlorproMAZINE (THORAZINE) IV, LORazepam, morphine injection, ondansetron (ZOFRAN) IV, sodium chloride  Antibiotics: Anti-infectives    Start     Dose/Rate Route Frequency Ordered Stop   10/12/14 2000  piperacillin-tazobactam (ZOSYN) IVPB 3.375 g     3.375 g 12.5 mL/hr over 240 Minutes Intravenous Every 8 hours 10/12/14 1923     10/12/14 0500  vancomycin (VANCOCIN) 1,500 mg in sodium chloride 0.9 %  500 mL IVPB     1,500 mg 250 mL/hr over 120 Minutes Intravenous  Once 10/12/14 0454 10/12/14 0725   10/12/14 0500  piperacillin-tazobactam (ZOSYN) IVPB 4.5 g  Status:  Discontinued     4.5 g 200 mL/hr over 30 Minutes Intravenous  Once 10/12/14 0454 10/12/14 0457   10/12/14 0500  piperacillin-tazobactam (ZOSYN) IVPB 3.375 g     3.375 g 100 mL/hr over 30 Minutes Intravenous  Once 10/12/14 0458 10/12/14 0550        Assessment/Plan 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 Will continue with conservative management. The patient understands he may need surgical intervention if he fails medical management.  -bowel rest, NGT to LIWS -check films -thorazine for hiccups  -zofran and reglan Multiple intra-abdominal abscesses-s/p drain 7/14.  Follow cultures. On ZosynD#1.  Will need long term antibiotics for multiple abscesses not amendable to drainage.  Will ask ID for recs.   -d/w Dr. Barry Dienes recommended checking Amylase and Triglycerides. Anemia of chronic disease-s/p 2 units of PRBCs 7/14.  H&h improved 9.4/27.9.  Repeat in AM, if stable, resume  heparin. Check Iron and TIBC, check FOBT.  May need to ask hem/onc for input re iron replacement.  renal insufficiency- sCr improved, adequate UOP.  Continue IVF.  Avoid nephrotoxins.  FEN-K normalized.  IVF, NPO.  VTE prophylaxis-SCD.  Plan to resume heparin if h&h stable in AM. Dispo-continue inpatient.     Erby Pian, [facility] Surgery Pager [phone])  10/13/2014 8:12 AM

## 2014-10-13 NOTE — Plan of Care (Signed)
Problem: Problem: Bowel/Bladder Progression Goal: BOWEL SOUNDS PRESENT/NON-DECREASED Outcome: Completed/Met Date Met:  10/13/14 BS active this afternoon

## 2014-10-13 NOTE — Progress Notes (Signed)
Patient ID: Darryl Griffith, male   DOB: 03-Nov-1945, 69 y.o.   MRN: [id number]    Referring Physician(s): CCS  Subjective:  Pt resting quietly; some soreness at drain site left flank; denies N/V; NG tube in place  Allergies: Compazine  Medications: Prior to Admission medications   Medication Sig Start Date End Date Taking? Authorizing Provider  LORazepam (ATIVAN) 1 MG tablet Take 1 tablet by mouth 3 (three) times daily as needed for anxiety. Anxiety/nausea 09/01/14  Yes Historical Provider, MD  Melatonin 5 MG CAPS Take 1 capsule by mouth at bedtime.  07/27/14  Yes Historical Provider, MD  metoCLOPramide (REGLAN) 10 MG/10ML SOLN Take 10 mLs (10 mg total) by mouth 4 (four) times daily -  before meals and at bedtime. 08/21/14  Yes Tyler Pita, MD  Multiple Vitamin (MULTIVITAMIN WITH MINERALS) TABS tablet Take 1 tablet by mouth every morning.    Yes Historical Provider, MD  pantoprazole (PROTONIX) 40 MG tablet Take 1 tablet (40 mg total) by mouth daily. 09/22/14  Yes Donne Hazel, MD  traZODone (DESYREL) 50 MG tablet Take 50 mg by mouth at bedtime.    Yes Historical Provider, MD  ondansetron (ZOFRAN ODT) 8 MG disintegrating tablet Take 1 tablet (8 mg total) by mouth every 8 (eight) hours as needed for nausea or vomiting. 10/11/14   Jola Schmidt, MD  oxyCODONE-acetaminophen (PERCOCET/ROXICET) 5-325 MG per tablet Take 1-2 tablets by mouth every 6 (six) hours as needed for severe pain. 08/04/14   Stark Klein, MD     Vital Signs: BP 139/71 mmHg  Pulse 98  Temp(Src) 99.5 F (37.5 C) (Oral)  Resp 16  Ht _0  (1.727 m)  Wt 174 lb 1.6 oz (78.971 kg)  BMI 26.48 kg/m2  SpO2 98%  Physical Examawake/alert; left RP drain intact, insertion site ok, mildly tender, output 30 cc's yellow fluid; cx's/amylase pend  Imaging: Ct Abdomen Pelvis W Contrast  10/12/2014   CLINICAL DATA:  69 year old male with nausea and vomiting. History of bowel obstruction history of left retroperitoneal liposarcoma  status post resection  EXAM: CT ABDOMEN AND PELVIS WITH CONTRAST  TECHNIQUE: Multidetector CT imaging of the abdomen and pelvis was performed using the standard protocol following bolus administration of intravenous contrast.  CONTRAST:  65m OMNIPAQUE IOHEXOL 300 MG/ML  SOLN  COMPARISON:  CT dated 08/29/2014  FINDINGS: Partially visualized small left pleural effusion. There is atelectatic changes of the left lung base. Pneumonia is not excluded. No intra-abdominal free air. There is diffuse mesenteric stranding and small perihepatic and right lower quadrant free fluid.  Subcentimeter hepatic hypodense lesion is too small to characterize. The liver is otherwise unremarkable. The gallbladder, pancreas, spleen, right adrenal gland appear unremarkable. Stable right renal hypodense lesion, likely a cyst. There is no hydronephrosis on the right. The visualized right ureter and urinary bladder as well as the prostate and seminal vesicles are grossly unremarkable.  The left adrenal gland is not visualized. Postsurgical changes of left nephrectomy. There is a 7.2 x 4.0 cm complex fluid collection containing small pockets of air in the left retroperitoneum and along the left psoas muscle compatible with an abscess. There has been interval removal of the percutaneous drainage catheter with interval increase in size of this collection compared to the prior study. Multiple loculated fluid noted along the greater curvature of the stomach and superior to the pancreas compatible with multiloculated abscesses. There has been interval increase in the size of this abscess is compared to the prior study. The largest  pocket measures approximately 4.3 x 2.6 cm (series 6, image 8). A tract like structure containing pockets of air noted extending from the abscesses along the stomach wall to the larger abscess in the left retroperitoneum along the psoas muscle.  An enteric tube is noted with tip in the stomach. There is dilatation of loops  of proximal and mid small bowel. The distal small bowel and terminal ileum are collapsed. The transition zone is noted in the left upper abdomen (coronal series 5, image 48 and axial series 2, image 33) adjacent to the retroperitoneal abscess and surgical clips. This may be related to surrounding inflammation or adhesion. There is extensive colonic diverticulosis. There is focal inflammation and thickening of the upper descending colon adjacent to the retroperitoneal abscess, likely secondary to adjacent inflammation. The appendix appears unremarkable.  There is aortoiliac atherosclerotic disease. No portal venous gas. Midline vertical anterior abdominal wall incisional scar. Degenerative changes of the spine. No acute fracture.  IMPRESSION: Interval removal of the left posterior percutaneous drainage catheter with interval increase in the size of the left retroperitoneal abscess. Multiple smaller abscesses noted along the posterior wall and greater curvature of the stomach which also have increased in size compared to the prior study. These collections appear to communicate with the larger abscess in the left retroperitoneum along the psoas muscle.  Small-bowel obstruction with transition zone in the left upper abdomen adjacent to the retroperitoneal collection. The cause of the obstruction may be related to adjacent inflammation or adhesions.  Small left pleural effusion with partial left lower lobe atelectasis/pneumonia.   Electronically Signed   By: Anner Crete M.D.   On: 10/12/2014 04:00   Ir Fluoro Guide Cv Line Right  10/12/2014   INDICATION: Retroperitoneal Abscess: Need for Central Venous Access for Prolonged Intravenous Antibiotics  EXAM: ULTRASOUND AND FLUOROSCOPIC GUIDED PICC LINE INSERTION  MEDICATIONS: None.  CONTRAST:  None  FLUOROSCOPY TIME:  6 seconds.  COMPLICATIONS: None immediate  TECHNIQUE: The procedure, risks, benefits, and alternatives were explained to the patient and informed  written consent was obtained. A timeout was performed prior to the initiation of the procedure.  The right upper extremity was prepped with chlorhexidine in a sterile fashion, and a sterile drape was applied covering the operative field. Maximum barrier sterile technique with sterile gowns and gloves were used for the procedure. A timeout was performed prior to the initiation of the procedure. Local anesthesia was provided with 1% lidocaine.  Under direct ultrasound guidance, the right brachial vein was accessed with a micropuncture kit after the overlying soft tissues were anesthetized with 1% lidocaine. An ultrasound image was saved for documentation purposes. A guidewire was advanced to the level of the superior caval-atrial junction for measurement purposes and the PICC line was cut to length. A peel-away sheath was placed and a 39 cm, 5 [place], dual lumen was inserted to level of the superior caval-atrial junction. A post procedure spot fluoroscopic was obtained. The catheter easily aspirated and flushed and was sutured in place. A dressing was placed. The patient tolerated the procedure well without immediate post procedural complication.  FINDINGS: After catheter placement, the tip lies within the superior cavoatrial junction. The catheter aspirates and flushes normally and is ready for immediate use.  IMPRESSION: Successful ultrasound and fluoroscopic guided placement of a right brachial vein approach, 39 cm, 5 French, dual lumen PICC with tip at the superior caval-atrial junction. The PICC line is ready for immediate use.  Read By:  Gareth Eagle, PA-C  Electronically Signed   By: Jacqulynn Cadet M.D.   On: 10/12/2014 13:50   Ir US Guide Vasc Access Right  10/12/2014   INDICATION: Retroperitoneal Abscess: Need for Central Venous Access for Prolonged Intravenous Antibiotics  EXAM: ULTRASOUND AND FLUOROSCOPIC GUIDED PICC LINE INSERTION  MEDICATIONS: None.  CONTRAST:  None  FLUOROSCOPY TIME:  6 seconds.   COMPLICATIONS: None immediate  TECHNIQUE: The procedure, risks, benefits, and alternatives were explained to the patient and informed written consent was obtained. A timeout was performed prior to the initiation of the procedure.  The right upper extremity was prepped with chlorhexidine in a sterile fashion, and a sterile drape was applied covering the operative field. Maximum barrier sterile technique with sterile gowns and gloves were used for the procedure. A timeout was performed prior to the initiation of the procedure. Local anesthesia was provided with 1% lidocaine.  Under direct ultrasound guidance, the right brachial vein was accessed with a micropuncture kit after the overlying soft tissues were anesthetized with 1% lidocaine. An ultrasound image was saved for documentation purposes. A guidewire was advanced to the level of the superior caval-atrial junction for measurement purposes and the PICC line was cut to length. A peel-away sheath was placed and a 39 cm, 5 [place], dual lumen was inserted to level of the superior caval-atrial junction. A post procedure spot fluoroscopic was obtained. The catheter easily aspirated and flushed and was sutured in place. A dressing was placed. The patient tolerated the procedure well without immediate post procedural complication.  FINDINGS: After catheter placement, the tip lies within the superior cavoatrial junction. The catheter aspirates and flushes normally and is ready for immediate use.  IMPRESSION: Successful ultrasound and fluoroscopic guided placement of a right brachial vein approach, 39 cm, 5 French, dual lumen PICC with tip at the superior caval-atrial junction. The PICC line is ready for immediate use.  Read By:  Gareth Eagle, PA-C   Electronically Signed   By: Jacqulynn Cadet M.D.   On: 10/12/2014 13:50   Dg Abd Acute W/chest  10/12/2014   CLINICAL DATA:  Nausea and vomiting since yesterday. History of liposarcoma with last radiation treatment on  10/03/2014. Nonsmoker.  EXAM: DG ABDOMEN ACUTE W/ 1V CHEST  COMPARISON:  CT abdomen and pelvis 08/29/2014. Abdominal series [id number].  FINDINGS: Blunting of the left costophrenic angle without change since previous study, likely representing pleural thickening. Normal heart size and pulmonary vascularity. No focal airspace disease or consolidation in the lungs. No pneumothorax. Mediastinal contours appear intact.  Surgical clips in the left upper quadrant. Interval development of prominent gaseous distention of mid abdominal small bowel with paucity of gas in the colon. Multiple air-fluid levels. Findings suggest small bowel obstruction. No free intra-abdominal air. No radiopaque stones. Degenerative changes in the spine and hips.  IMPRESSION: Gaseous distention of small bowel with air-fluid levels consistent with small bowel obstruction.   Electronically Signed   By: Lucienne Capers M.D.   On: 10/12/2014 00:27   Dg Abd Portable 1v  10/13/2014   CLINICAL DATA:  Followup small bowel obstruction. Abdominal abscess. Retroperitoneal liposarcoma.  EXAM: PORTABLE ABDOMEN - 1 VIEW  COMPARISON:  10/11/2014  FINDINGS: A new nasogastric tube is seen with tip in the proximal stomach. Decreased small bowel dilatation is seen compared to previous study. Oral contrast from recent CT is now seen in the colon.  A new percutaneous drainage catheter is seen in the left upper quadrant. Multiple surgical clips again seen in left quadrant.  IMPRESSION:  Decreased small bowel dilatation since prior exam. Nasogastric tube in appropriate position.   Electronically Signed   By: Earle Gell M.D.   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 Barry Dienes. This was complicated by development of a retroperitoneal abscess. Dr. Pascal Lux placed a drain on 08/15/2014. Patient then  underwent a drain/fistula check by Dr. Pascal Lux on 09/26/2014. There was no evidence of fistulous connection to any adjacent organ and the drainage catheter was removed.He had been doing well until Tuesday afternoon when he developed nausea, vomiting and bloating. CT shows an increase of the left retroperitoneal abscess with multiple smaller abscesses which communicate with the larger one and a small bowel obstruction with transition zone in the left upper abdomen. Patient presents for replacement of percutaneous drainage catheter.  EXAM: CT IMAGE GUIDED DRAINAGE BY PERCUTANEOUS CATHETER  Date: 10/12/2014  PROCEDURE: 1. Placement of a 12 French drainage catheter modified with additional sideholes into the left retroperitoneal abscess Interventional Radiologist:  Criselda Peaches, MD  ANESTHESIA/SEDATION: Moderate (conscious) sedation was used. 4 mg Versed, 50 mcg Fentanyl were administered intravenously. The patient's vital signs were monitored continuously by radiology nursing throughout the procedure.  Sedation Time: 33 minutes  MEDICATIONS: None additional  TECHNIQUE: Informed consent was obtained from the patient following explanation of the procedure, risks, benefits and alternatives. The patient understands, agrees and consents for the procedure. All questions were addressed. A time out was performed.  A planning axial CT scan was performed. The complex fluid and gas collection in the left retroperitoneal space was successfully identified. A suitable entry site just medial and inferior to the left twelfth rib was selected. The skin was marked. The region was then sterilely prepped and draped in standard fashion using Betadine skin prep. Local anesthesia was attained by infiltration with 1% lidocaine. A small dermatotomy was made. Under intermittent CT fluoroscopic guidance, an 18 gauge trocar needle was advanced into the inferior aspect of the left retroperitoneal fluid collection. A short Amplatz wire was then  advanced cranially within the fluid collection. The skin tract was then dilated and a Cook 12 [place] all-purpose drainage catheter modified with additional sideholes was advanced over the wire and formed within the abscess cavity. Aspiration yields approximately 100 mL thick, purulent material.  Post aspiration CT imaging demonstrates resolution of fluid, however there is a persistent thick walled cavity containing air which has not collapsed around the catheter. The catheter was secured to the skin with 0 Prolene suture and connected to JP bulb suction.  COMPLICATIONS: None  Estimated blood loss: 0  IMPRESSION: 1. Successful placement of a 12 French drainage catheter modified with additional sideholes into the recurrent left retroperitoneal abscess cavity. Aspiration yields 100 mL thick, purulent material. Samples were sent for culture, anaerobic culture and fungal culture. 2. Following aspiration, the thick walled cavity does not collapse and remains present as an air containing potential space.  Signed,  Criselda Peaches, MD  [facility]  [facility]   Electronically Signed   By: Jacqulynn Cadet M.D.   On: 10/12/2014 17:02    Labs:  CBC:  Recent Labs  09/22/14 0353 10/11/14 1705 10/12/14 0845 10/13/14 0510  WBC 1.7* 7.6 6.4 6.2  HGB 8.1* 8.5* 6.8* 9.4*  HCT 24.7* 25.9* 20.9* 27.9*  PLT 246 690* 578* 599*  COAGS:  Recent Labs  07/13/14 1939  07/14/14 1055 07/14/14 1830 07/15/14 0058 07/15/14 0515 07/16/14 0700 09/18/14 1400  INR 1.42  --  1.20  --   --   --  1.21 1.06  APTT  --   < > 33 36 31 33  --  30  < > = values in this interval not displayed.  BMP:  Recent Labs  09/20/14 0419 10/11/14 1705 10/12/14 0845 10/13/14 0510  NA 135 128* 132* 135  K 4.0 3.0* 3.1* 3.5  CL 102 93* 100* 107  CO2 _0 GLUCOSE 105* 125* 102* 111*  BUN 25* 22* 21* 19  CALCIUM 8.2* 8.7* 8.0* 7.9*  CREATININE 1.15 1.31* 1.35* 1.27*   GFRNONAA >60 54* 52* 56*  GFRAA >60 >60 60* >60    LIVER FUNCTION TESTS:  Recent Labs  07/05/14 1350 09/18/14 0959 10/11/14 1705  BILITOT 0.6 <0.20 0.5  AST _1 ALT _2 ALKPHOS 58 65 101  PROT 7.5 5.3* 6.6  ALBUMIN 4.2 2.4* 2.4*    Assessment and Plan: S/p recurrent left RP fluid collection/abscess drainage 7/14; temp 99.5; WBC nl; hgb 9.4, creat 1.27; check final cx's/fluid amylase; check f/u CT next week   Signed: D. Rowe Consuelo 10/13/2014, 3:17 PM   I spent a total of 15 minutes in face to face in clinical consultation/evaluation, greater than 50% of which was counseling/coordinating care for left RP fluid collection drainage

## 2014-10-14 ENCOUNTER — Inpatient Hospital Stay (HOSPITAL_COMMUNITY): Payer: Medicare Other

## 2014-10-14 DIAGNOSIS — K6819 Other retroperitoneal abscess: Principal | ICD-10-CM

## 2014-10-14 LAB — OCCULT BLOOD X 1 CARD TO LAB, STOOL
FECAL OCCULT BLD: POSITIVE — AB
Fecal Occult Bld: POSITIVE — AB

## 2014-10-14 MED ORDER — VITAMINS A & D EX OINT
TOPICAL_OINTMENT | CUTANEOUS | Status: AC
  Administered 2014-10-14: 5
  Filled 2014-10-14: qty 5

## 2014-10-14 MED ORDER — HEPARIN SODIUM (PORCINE) 5000 UNIT/ML IJ SOLN
5000.0000 [IU] | Freq: Three times a day (TID) | INTRAMUSCULAR | Status: DC
  Administered 2014-10-14 – 2014-10-20 (×19): 5000 [IU] via SUBCUTANEOUS
  Filled 2014-10-14 (×20): qty 1

## 2014-10-14 MED ORDER — HYDROCORTISONE 2.5 % RE CREA
TOPICAL_CREAM | Freq: Four times a day (QID) | RECTAL | Status: DC
  Administered 2014-10-14 – 2014-10-17 (×15): via RECTAL
  Filled 2014-10-14: qty 28.35

## 2014-10-14 NOTE — Progress Notes (Signed)
Patient ID: Darryl Griffith, male   DOB: 1945-09-15, 69 y.o.   MRN: [id number]    Subjective: No major C/O x hemorrhoid swelling.  Denies abd pain.  Has had flatus and a few small BMs  Objective: Vital signs in last 24 hours: Temp:  [98 F (36.7 C)-99.5 F (37.5 C)] 98 F (36.7 C) (07/16 0521) Pulse Rate:  [98-100] 99 (07/16 0521) Resp:  [16-20] 20 (07/16 0521) BP: (125-139)/(69-71) 125/71 mmHg (07/16 0521) SpO2:  [96 %-98 %] 96 % (07/16 0521) Last BM Date: 10/13/14  Intake/Output from previous day: 07/15 0701 - 07/16 0700 In: 4475 [I.V.:3845; NG/GT:480; IV Piggyback:150] Out: 3215 [Urine:1250; Emesis/NG output:1900; Drains:65] Intake/Output this shift:    General appearance: alert, cooperative and no distress Resp: clear to auscultation bilaterally GI: normal findings: soft, non-tender and minimal distention. Drain with cloudy yellow drainage.  Moderately swollen, edematous external hemorrhoids with possible early thrombosis  Lab Results:   Recent Labs  10/12/14 0845 10/13/14 0510  WBC 6.4 6.2  HGB 6.8* 9.4*  HCT 20.9* 27.9*  PLT 578* 599*   BMET  Recent Labs  10/12/14 0845 10/13/14 0510  NA 132* 135  K 3.1* 3.5  CL 100* 107  CO2 23 23  GLUCOSE 102* 111*  BUN 21* 19  CREATININE 1.35* 1.27*  CALCIUM 8.0* 7.9*     Studies/Results: Ir Fluoro Guide Cv Line Right  10/12/2014   INDICATION: Retroperitoneal Abscess: Need for Central Venous Access for Prolonged Intravenous Antibiotics  EXAM: ULTRASOUND AND FLUOROSCOPIC GUIDED PICC LINE INSERTION  MEDICATIONS: None.  CONTRAST:  None  FLUOROSCOPY TIME:  6 seconds.  COMPLICATIONS: None immediate  TECHNIQUE: The procedure, risks, benefits, and alternatives were explained to the patient and informed written consent was obtained. A timeout was performed prior to the initiation of the procedure.  The right upper extremity was prepped with chlorhexidine in a sterile fashion, and a sterile drape was applied covering the operative  field. Maximum barrier sterile technique with sterile gowns and gloves were used for the procedure. A timeout was performed prior to the initiation of the procedure. Local anesthesia was provided with 1% lidocaine.  Under direct ultrasound guidance, the right brachial vein was accessed with a micropuncture kit after the overlying soft tissues were anesthetized with 1% lidocaine. An ultrasound image was saved for documentation purposes. A guidewire was advanced to the level of the superior caval-atrial junction for measurement purposes and the PICC line was cut to length. A peel-away sheath was placed and a 39 cm, 5 [place], dual lumen was inserted to level of the superior caval-atrial junction. A post procedure spot fluoroscopic was obtained. The catheter easily aspirated and flushed and was sutured in place. A dressing was placed. The patient tolerated the procedure well without immediate post procedural complication.  FINDINGS: After catheter placement, the tip lies within the superior cavoatrial junction. The catheter aspirates and flushes normally and is ready for immediate use.  IMPRESSION: Successful ultrasound and fluoroscopic guided placement of a right brachial vein approach, 39 cm, 5 French, dual lumen PICC with tip at the superior caval-atrial junction. The PICC line is ready for immediate use.  Read By:  Gareth Eagle, PA-C   Electronically Signed   By: Jacqulynn Cadet M.D.   On: 10/12/2014 13:50   Ir US Guide Vasc Access Right  10/12/2014   INDICATION: Retroperitoneal Abscess: Need for Central Venous Access for Prolonged Intravenous Antibiotics  EXAM: ULTRASOUND AND FLUOROSCOPIC GUIDED PICC LINE INSERTION  MEDICATIONS: None.  CONTRAST:  None  FLUOROSCOPY TIME:  6 seconds.  COMPLICATIONS: None immediate  TECHNIQUE: The procedure, risks, benefits, and alternatives were explained to the patient and informed written consent was obtained. A timeout was performed prior to the initiation of the procedure.   The right upper extremity was prepped with chlorhexidine in a sterile fashion, and a sterile drape was applied covering the operative field. Maximum barrier sterile technique with sterile gowns and gloves were used for the procedure. A timeout was performed prior to the initiation of the procedure. Local anesthesia was provided with 1% lidocaine.  Under direct ultrasound guidance, the right brachial vein was accessed with a micropuncture kit after the overlying soft tissues were anesthetized with 1% lidocaine. An ultrasound image was saved for documentation purposes. A guidewire was advanced to the level of the superior caval-atrial junction for measurement purposes and the PICC line was cut to length. A peel-away sheath was placed and a 39 cm, 5 [place], dual lumen was inserted to level of the superior caval-atrial junction. A post procedure spot fluoroscopic was obtained. The catheter easily aspirated and flushed and was sutured in place. A dressing was placed. The patient tolerated the procedure well without immediate post procedural complication.  FINDINGS: After catheter placement, the tip lies within the superior cavoatrial junction. The catheter aspirates and flushes normally and is ready for immediate use.  IMPRESSION: Successful ultrasound and fluoroscopic guided placement of a right brachial vein approach, 39 cm, 5 French, dual lumen PICC with tip at the superior caval-atrial junction. The PICC line is ready for immediate use.  Read By:  Gareth Eagle, PA-C   Electronically Signed   By: Jacqulynn Cadet M.D.   On: 10/12/2014 13:50   Dg Abd Portable 1v  10/14/2014   CLINICAL DATA:  Small-bowel obstruction. History of left retroperitoneal mass, retroperitoneal fluid collection, resection of retroperitoneal mass.  EXAM: PORTABLE ABDOMEN - 1 VIEW  COMPARISON:  7152 I  FINDINGS: There is residual contrast in the colon following recent CT exam. A left-sided percutaneous drain remains in place in the regional  numerous surgical clips. There is dilatation of small bowel loops in the central abdomen. There has been slight interval improvement in dilatation since previous exam.  IMPRESSION: 1. Persistent small bowel obstruction showing slight interval decrease in distension of small bowel loops. 2. Persistent left upper quadrant drain. 3. Residual contrast in the colon.   Electronically Signed   By: Nolon Nations M.D.   On: 10/14/2014 07:26   Dg Abd Portable 1v  10/13/2014   CLINICAL DATA:  Followup small bowel obstruction. Abdominal abscess. Retroperitoneal liposarcoma.  EXAM: PORTABLE ABDOMEN - 1 VIEW  COMPARISON:  10/11/2014  FINDINGS: A new nasogastric tube is seen with tip in the proximal stomach. Decreased small bowel dilatation is seen compared to previous study. Oral contrast from recent CT is now seen in the colon.  A new percutaneous drainage catheter is seen in the left upper quadrant. Multiple surgical clips again seen in left quadrant.  IMPRESSION: Decreased small bowel dilatation since prior exam. Nasogastric tube in appropriate position.   Electronically Signed   By: Earle Gell M.D.   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 Barry Dienes. This was complicated by development of a retroperitoneal abscess. Dr. Pascal Lux placed a drain on 08/15/2014. Patient then underwent a drain/fistula check by  Dr. Pascal Lux on 09/26/2014. There was no evidence of fistulous connection to any adjacent organ and the drainage catheter was removed.He had been doing well until Tuesday afternoon when he developed nausea, vomiting and bloating. CT shows an increase of the left retroperitoneal abscess with multiple smaller abscesses which communicate with the larger one and a small bowel obstruction with transition zone in the left upper abdomen. Patient presents for  replacement of percutaneous drainage catheter.  EXAM: CT IMAGE GUIDED DRAINAGE BY PERCUTANEOUS CATHETER  Date: 10/12/2014  PROCEDURE: 1. Placement of a 12 French drainage catheter modified with additional sideholes into the left retroperitoneal abscess Interventional Radiologist:  Criselda Peaches, MD  ANESTHESIA/SEDATION: Moderate (conscious) sedation was used. 4 mg Versed, 50 mcg Fentanyl were administered intravenously. The patient's vital signs were monitored continuously by radiology nursing throughout the procedure.  Sedation Time: 33 minutes  MEDICATIONS: None additional  TECHNIQUE: Informed consent was obtained from the patient following explanation of the procedure, risks, benefits and alternatives. The patient understands, agrees and consents for the procedure. All questions were addressed. A time out was performed.  A planning axial CT scan was performed. The complex fluid and gas collection in the left retroperitoneal space was successfully identified. A suitable entry site just medial and inferior to the left twelfth rib was selected. The skin was marked. The region was then sterilely prepped and draped in standard fashion using Betadine skin prep. Local anesthesia was attained by infiltration with 1% lidocaine. A small dermatotomy was made. Under intermittent CT fluoroscopic guidance, an 18 gauge trocar needle was advanced into the inferior aspect of the left retroperitoneal fluid collection. A short Amplatz wire was then advanced cranially within the fluid collection. The skin tract was then dilated and a Cook 12 [place] all-purpose drainage catheter modified with additional sideholes was advanced over the wire and formed within the abscess cavity. Aspiration yields approximately 100 mL thick, purulent material.  Post aspiration CT imaging demonstrates resolution of fluid, however there is a persistent thick walled cavity containing air which has not collapsed around the catheter. The catheter was  secured to the skin with 0 Prolene suture and connected to JP bulb suction.  COMPLICATIONS: None  Estimated blood loss: 0  IMPRESSION: 1. Successful placement of a 12 French drainage catheter modified with additional sideholes into the recurrent left retroperitoneal abscess cavity. Aspiration yields 100 mL thick, purulent material. Samples were sent for culture, anaerobic culture and fungal culture. 2. Following aspiration, the thick walled cavity does not collapse and remains present as an air containing potential space.  Signed,  Criselda Peaches, MD  [facility]  [facility]   Electronically Signed   By: Jacqulynn Cadet M.D.   On: 10/12/2014 17:02    Anti-infectives: Anti-infectives    Start     Dose/Rate Route Frequency Ordered Stop   10/13/14 1845  ertapenem (INVANZ) 1 g in sodium chloride 0.9 % 50 mL IVPB     1 g 100 mL/hr over 30 Minutes Intravenous Daily after supper 10/13/14 1816     10/12/14 2000  piperacillin-tazobactam (ZOSYN) IVPB 3.375 g  Status:  Discontinued     3.375 g 12.5 mL/hr over 240 Minutes Intravenous Every 8 hours 10/12/14 1923 10/13/14 1816   10/12/14 0500  vancomycin (VANCOCIN) 1,500 mg in sodium chloride 0.9 % 500 mL IVPB     1,500 mg 250 mL/hr over 120 Minutes Intravenous  Once 10/12/14 0454 10/12/14 0725   10/12/14 0500  piperacillin-tazobactam (ZOSYN)  IVPB 4.5 g  Status:  Discontinued     4.5 g 200 mL/hr over 30 Minutes Intravenous  Once 10/12/14 0454 10/12/14 0457   10/12/14 0500  piperacillin-tazobactam (ZOSYN) IVPB 3.375 g     3.375 g 100 mL/hr over 30 Minutes Intravenous  Once 10/12/14 0458 10/12/14 0550      Assessment/Plan: 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 Films with some improvement today and abdomen  benign with bowel function.  Will continue with conservative management. The patient understands he may need surgical intervention if he fails medical management.  -bowel rest, NGT to LIWS  Multiple intra-abdominal abscesses-s/p drain 7/14. Follow cultures. On ZosynD #2. Will need long term antibiotics for multiple abscesses not amendable to drainage. Will ask ID for recs.  -d/w Dr. Barry Dienes recommended checking Amylase and Triglycerides. Anemia of chronic disease-s/p 2 units of PRBCs 7/14. H&h improved 9.4/27.9.  renal insufficiency- sCr improved, adequate UOP. Continue IVF. Avoid nephrotoxins.  FEN-K normalized. IVF, NPO.  VTE prophylaxis-SCD. Plan to resume heparin Hemmorhoids- Start annusol [facility] Dis[facility]-continue inpatient.       LOS: 2 days    Illyanna Petillo T 10/14/2014

## 2014-10-14 NOTE — Progress Notes (Signed)
Referring Physician(s): CCS  Subjective:  RP abscess drain placed 7/14 Some better Ambulating Not eating yet  Allergies: Compazine  Medications: Prior to Admission medications   Medication Sig Start Date End Date Taking? Authorizing Provider  LORazepam (ATIVAN) 1 MG tablet Take 1 tablet by mouth 3 (three) times daily as needed for anxiety. Anxiety/nausea 09/01/14  Yes Historical Provider, MD  Melatonin 5 MG CAPS Take 1 capsule by mouth at bedtime.  07/27/14  Yes Historical Provider, MD  metoCLOPramide (REGLAN) 10 MG/10ML SOLN Take 10 mLs (10 mg total) by mouth 4 (four) times daily -  before meals and at bedtime. 08/21/14  Yes Tyler Pita, MD  Multiple Vitamin (MULTIVITAMIN WITH MINERALS) TABS tablet Take 1 tablet by mouth every morning.    Yes Historical Provider, MD  pantoprazole (PROTONIX) 40 MG tablet Take 1 tablet (40 mg total) by mouth daily. 09/22/14  Yes Donne Hazel, MD  traZODone (DESYREL) 50 MG tablet Take 50 mg by mouth at bedtime.    Yes Historical Provider, MD  ondansetron (ZOFRAN ODT) 8 MG disintegrating tablet Take 1 tablet (8 mg total) by mouth every 8 (eight) hours as needed for nausea or vomiting. 10/11/14   Jola Schmidt, MD  oxyCODONE-acetaminophen (PERCOCET/ROXICET) 5-325 MG per tablet Take 1-2 tablets by mouth every 6 (six) hours as needed for severe pain. 08/04/14   Stark Klein, MD     Vital Signs: BP 125/71 mmHg  Pulse 99  Temp(Src) 98 F (36.7 C) (Oral)  Resp 20  Ht _0  (1.727 m)  Wt 174 lb 1.6 oz (78.971 kg)  BMI 26.48 kg/m2  SpO2 96%  Physical Exam  Skin: Skin is warm.  Skin site of RP abscess drain is clean and dry Sl reddened New dressing placed  Output 65 cc yesterday Milky yellow color in JP - 20 cc Cx: no growth; GPC  afeb Wbc wnl  Nursing note and vitals reviewed.   Imaging: Ct Abdomen Pelvis W Contrast  10/12/2014   CLINICAL DATA:  69 year old male with nausea and vomiting. History of bowel obstruction history of left  retroperitoneal liposarcoma status post resection  EXAM: CT ABDOMEN AND PELVIS WITH CONTRAST  TECHNIQUE: Multidetector CT imaging of the abdomen and pelvis was performed using the standard protocol following bolus administration of intravenous contrast.  CONTRAST:  25m OMNIPAQUE IOHEXOL 300 MG/ML  SOLN  COMPARISON:  CT dated 08/29/2014  FINDINGS: Partially visualized small left pleural effusion. There is atelectatic changes of the left lung base. Pneumonia is not excluded. No intra-abdominal free air. There is diffuse mesenteric stranding and small perihepatic and right lower quadrant free fluid.  Subcentimeter hepatic hypodense lesion is too small to characterize. The liver is otherwise unremarkable. The gallbladder, pancreas, spleen, right adrenal gland appear unremarkable. Stable right renal hypodense lesion, likely a cyst. There is no hydronephrosis on the right. The visualized right ureter and urinary bladder as well as the prostate and seminal vesicles are grossly unremarkable.  The left adrenal gland is not visualized. Postsurgical changes of left nephrectomy. There is a 7.2 x 4.0 cm complex fluid collection containing small pockets of air in the left retroperitoneum and along the left psoas muscle compatible with an abscess. There has been interval removal of the percutaneous drainage catheter with interval increase in size of this collection compared to the prior study. Multiple loculated fluid noted along the greater curvature of the stomach and superior to the pancreas compatible with multiloculated abscesses. There has been interval increase in the  size of this abscess is compared to the prior study. The largest pocket measures approximately 4.3 x 2.6 cm (series 6, image 8). A tract like structure containing pockets of air noted extending from the abscesses along the stomach wall to the larger abscess in the left retroperitoneum along the psoas muscle.  An enteric tube is noted with tip in the stomach.  There is dilatation of loops of proximal and mid small bowel. The distal small bowel and terminal ileum are collapsed. The transition zone is noted in the left upper abdomen (coronal series 5, image 48 and axial series 2, image 33) adjacent to the retroperitoneal abscess and surgical clips. This may be related to surrounding inflammation or adhesion. There is extensive colonic diverticulosis. There is focal inflammation and thickening of the upper descending colon adjacent to the retroperitoneal abscess, likely secondary to adjacent inflammation. The appendix appears unremarkable.  There is aortoiliac atherosclerotic disease. No portal venous gas. Midline vertical anterior abdominal wall incisional scar. Degenerative changes of the spine. No acute fracture.  IMPRESSION: Interval removal of the left posterior percutaneous drainage catheter with interval increase in the size of the left retroperitoneal abscess. Multiple smaller abscesses noted along the posterior wall and greater curvature of the stomach which also have increased in size compared to the prior study. These collections appear to communicate with the larger abscess in the left retroperitoneum along the psoas muscle.  Small-bowel obstruction with transition zone in the left upper abdomen adjacent to the retroperitoneal collection. The cause of the obstruction may be related to adjacent inflammation or adhesions.  Small left pleural effusion with partial left lower lobe atelectasis/pneumonia.   Electronically Signed   By: Anner Crete M.D.   On: 10/12/2014 04:00   Ir Fluoro Guide Cv Line Right  10/12/2014   INDICATION: Retroperitoneal Abscess: Need for Central Venous Access for Prolonged Intravenous Antibiotics  EXAM: ULTRASOUND AND FLUOROSCOPIC GUIDED PICC LINE INSERTION  MEDICATIONS: None.  CONTRAST:  None  FLUOROSCOPY TIME:  6 seconds.  COMPLICATIONS: None immediate  TECHNIQUE: The procedure, risks, benefits, and alternatives were explained to  the patient and informed written consent was obtained. A timeout was performed prior to the initiation of the procedure.  The right upper extremity was prepped with chlorhexidine in a sterile fashion, and a sterile drape was applied covering the operative field. Maximum barrier sterile technique with sterile gowns and gloves were used for the procedure. A timeout was performed prior to the initiation of the procedure. Local anesthesia was provided with 1% lidocaine.  Under direct ultrasound guidance, the right brachial vein was accessed with a micropuncture kit after the overlying soft tissues were anesthetized with 1% lidocaine. An ultrasound image was saved for documentation purposes. A guidewire was advanced to the level of the superior caval-atrial junction for measurement purposes and the PICC line was cut to length. A peel-away sheath was placed and a 39 cm, 5 [place], dual lumen was inserted to level of the superior caval-atrial junction. A post procedure spot fluoroscopic was obtained. The catheter easily aspirated and flushed and was sutured in place. A dressing was placed. The patient tolerated the procedure well without immediate post procedural complication.  FINDINGS: After catheter placement, the tip lies within the superior cavoatrial junction. The catheter aspirates and flushes normally and is ready for immediate use.  IMPRESSION: Successful ultrasound and fluoroscopic guided placement of a right brachial vein approach, 39 cm, 5 French, dual lumen PICC with tip at the superior caval-atrial junction. The PICC line  is ready for immediate use.  Read By:  Gareth Eagle, PA-C   Electronically Signed   By: Jacqulynn Cadet M.D.   On: 10/12/2014 13:50   Ir US Guide Vasc Access Right  10/12/2014   INDICATION: Retroperitoneal Abscess: Need for Central Venous Access for Prolonged Intravenous Antibiotics  EXAM: ULTRASOUND AND FLUOROSCOPIC GUIDED PICC LINE INSERTION  MEDICATIONS: None.  CONTRAST:  None   FLUOROSCOPY TIME:  6 seconds.  COMPLICATIONS: None immediate  TECHNIQUE: The procedure, risks, benefits, and alternatives were explained to the patient and informed written consent was obtained. A timeout was performed prior to the initiation of the procedure.  The right upper extremity was prepped with chlorhexidine in a sterile fashion, and a sterile drape was applied covering the operative field. Maximum barrier sterile technique with sterile gowns and gloves were used for the procedure. A timeout was performed prior to the initiation of the procedure. Local anesthesia was provided with 1% lidocaine.  Under direct ultrasound guidance, the right brachial vein was accessed with a micropuncture kit after the overlying soft tissues were anesthetized with 1% lidocaine. An ultrasound image was saved for documentation purposes. A guidewire was advanced to the level of the superior caval-atrial junction for measurement purposes and the PICC line was cut to length. A peel-away sheath was placed and a 39 cm, 5 [place], dual lumen was inserted to level of the superior caval-atrial junction. A post procedure spot fluoroscopic was obtained. The catheter easily aspirated and flushed and was sutured in place. A dressing was placed. The patient tolerated the procedure well without immediate post procedural complication.  FINDINGS: After catheter placement, the tip lies within the superior cavoatrial junction. The catheter aspirates and flushes normally and is ready for immediate use.  IMPRESSION: Successful ultrasound and fluoroscopic guided placement of a right brachial vein approach, 39 cm, 5 French, dual lumen PICC with tip at the superior caval-atrial junction. The PICC line is ready for immediate use.  Read By:  Gareth Eagle, PA-C   Electronically Signed   By: Jacqulynn Cadet M.D.   On: 10/12/2014 13:50   Dg Abd Acute W/chest  10/12/2014   CLINICAL DATA:  Nausea and vomiting since yesterday. History of liposarcoma with  last radiation treatment on 10/03/2014. Nonsmoker.  EXAM: DG ABDOMEN ACUTE W/ 1V CHEST  COMPARISON:  CT abdomen and pelvis 08/29/2014. Abdominal series [id number].  FINDINGS: Blunting of the left costophrenic angle without change since previous study, likely representing pleural thickening. Normal heart size and pulmonary vascularity. No focal airspace disease or consolidation in the lungs. No pneumothorax. Mediastinal contours appear intact.  Surgical clips in the left upper quadrant. Interval development of prominent gaseous distention of mid abdominal small bowel with paucity of gas in the colon. Multiple air-fluid levels. Findings suggest small bowel obstruction. No free intra-abdominal air. No radiopaque stones. Degenerative changes in the spine and hips.  IMPRESSION: Gaseous distention of small bowel with air-fluid levels consistent with small bowel obstruction.   Electronically Signed   By: Lucienne Capers M.D.   On: 10/12/2014 00:27   Dg Abd Portable 1v  10/14/2014   CLINICAL DATA:  Small-bowel obstruction. History of left retroperitoneal mass, retroperitoneal fluid collection, resection of retroperitoneal mass.  EXAM: PORTABLE ABDOMEN - 1 VIEW  COMPARISON:  7152 I  FINDINGS: There is residual contrast in the colon following recent CT exam. A left-sided percutaneous drain remains in place in the regional numerous surgical clips. There is dilatation of small bowel loops in the central abdomen.  There has been slight interval improvement in dilatation since previous exam.  IMPRESSION: 1. Persistent small bowel obstruction showing slight interval decrease in distension of small bowel loops. 2. Persistent left upper quadrant drain. 3. Residual contrast in the colon.   Electronically Signed   By: Nolon Nations M.D.   On: 10/14/2014 07:26   Dg Abd Portable 1v  10/13/2014   CLINICAL DATA:  Followup small bowel obstruction. Abdominal abscess. Retroperitoneal liposarcoma.  EXAM: PORTABLE ABDOMEN - 1 VIEW   COMPARISON:  10/11/2014  FINDINGS: A new nasogastric tube is seen with tip in the proximal stomach. Decreased small bowel dilatation is seen compared to previous study. Oral contrast from recent CT is now seen in the colon.  A new percutaneous drainage catheter is seen in the left upper quadrant. Multiple surgical clips again seen in left quadrant.  IMPRESSION: Decreased small bowel dilatation since prior exam. Nasogastric tube in appropriate position.   Electronically Signed   By: Earle Gell M.D.   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 Barry Dienes. This was complicated by development of a retroperitoneal abscess. Dr. Pascal Lux placed a drain on 08/15/2014. Patient then underwent a drain/fistula check by Dr. Pascal Lux on 09/26/2014. There was no evidence of fistulous connection to any adjacent organ and the drainage catheter was removed.He had been doing well until Tuesday afternoon when he developed nausea, vomiting and bloating. CT shows an increase of the left retroperitoneal abscess with multiple smaller abscesses which communicate with the larger one and a small bowel obstruction with transition zone in the left upper abdomen. Patient presents for replacement of percutaneous drainage catheter.  EXAM: CT IMAGE GUIDED DRAINAGE BY PERCUTANEOUS CATHETER  Date: 10/12/2014  PROCEDURE: 1. Placement of a 12 French drainage catheter modified with additional sideholes into the left retroperitoneal abscess Interventional Radiologist:  Criselda Peaches, MD  ANESTHESIA/SEDATION: Moderate (conscious) sedation was used. 4 mg Versed, 50 mcg Fentanyl were administered intravenously. The patient's vital signs were monitored continuously by radiology nursing throughout the procedure.  Sedation Time: 33 minutes  MEDICATIONS: None additional  TECHNIQUE: Informed  consent was obtained from the patient following explanation of the procedure, risks, benefits and alternatives. The patient understands, agrees and consents for the procedure. All questions were addressed. A time out was performed.  A planning axial CT scan was performed. The complex fluid and gas collection in the left retroperitoneal space was successfully identified. A suitable entry site just medial and inferior to the left twelfth rib was selected. The skin was marked. The region was then sterilely prepped and draped in standard fashion using Betadine skin prep. Local anesthesia was attained by infiltration with 1% lidocaine. A small dermatotomy was made. Under intermittent CT fluoroscopic guidance, an 18 gauge trocar needle was advanced into the inferior aspect of the left retroperitoneal fluid collection. A short Amplatz wire was then advanced cranially within the fluid collection. The skin tract was then dilated and a Cook 12 [place] all-purpose drainage catheter modified with additional sideholes was advanced over the wire and formed within the abscess cavity. Aspiration yields approximately 100 mL thick, purulent material.  Post aspiration CT imaging demonstrates resolution of fluid, however there is a persistent thick walled cavity containing air which has not collapsed around the catheter. The catheter was secured to the skin with 0 Prolene suture and connected  to JP bulb suction.  COMPLICATIONS: None  Estimated blood loss: 0  IMPRESSION: 1. Successful placement of a 12 French drainage catheter modified with additional sideholes into the recurrent left retroperitoneal abscess cavity. Aspiration yields 100 mL thick, purulent material. Samples were sent for culture, anaerobic culture and fungal culture. 2. Following aspiration, the thick walled cavity does not collapse and remains present as an air containing potential space.  Signed,  Criselda Peaches, MD  [facility]  [facility]   Electronically Signed   By: Jacqulynn Cadet M.D.   On: 10/12/2014 17:02    Labs:  CBC:  Recent Labs  09/22/14 0353 10/11/14 1705 10/12/14 0845 10/13/14 0510  WBC 1.7* 7.6 6.4 6.2  HGB 8.1* 8.5* 6.8* 9.4*  HCT 24.7* 25.9* 20.9* 27.9*  PLT 246 690* 578* 599*    COAGS:  Recent Labs  07/13/14 1939  07/14/14 1055 07/14/14 1830 07/15/14 0058 07/15/14 0515 07/16/14 0700 09/18/14 1400  INR 1.42  --  1.20  --   --   --  1.21 1.06  APTT  --   < > 33 36 31 33  --  30  < > = values in this interval not displayed.  BMP:  Recent Labs  09/20/14 0419 10/11/14 1705 10/12/14 0845 10/13/14 0510  NA 135 128* 132* 135  K 4.0 3.0* 3.1* 3.5  CL 102 93* 100* 107  CO2 _0 GLUCOSE 105* 125* 102* 111*  BUN 25* 22* 21* 19  CALCIUM 8.2* 8.7* 8.0* 7.9*  CREATININE 1.15 1.31* 1.35* 1.27*  GFRNONAA >60 54* 52* 56*  GFRAA >60 >60 60* >60    LIVER FUNCTION TESTS:  Recent Labs  07/05/14 1350 09/18/14 0959 10/11/14 1705  BILITOT 0.6 <0.20 0.5  AST _1 ALT _2 ALKPHOS 58 65 101  PROT 7.5 5.3* 6.6  ALBUMIN 4.2 2.4* 2.4*    Assessment and Plan:  Retroperitoneal abscess drain intact Will follow Plan per CCS  Signed: [place] A 10/14/2014, 12:13 PM   I spent a total of 15 Minutes in face to face in clinical consultation/evaluation, greater than 50% of which was counseling/coordinating care for RP abscess drain

## 2014-10-14 NOTE — Progress Notes (Signed)
Patient ID: Darryl Griffith, male   DOB: 01/29/1946, 69 y.o.   MRN: [id number]         [facility] for Infectious Disease    Date of Admission:  10/11/2014           Day day 3 antibiotic therapy  Active Problems:   Abdominal abscess   Small bowel obstruction   Intra-abdominal fluid collection   . ertapenem  1 g Intravenous QPC supper  . heparin subcutaneous  5,000 Units Subcutaneous 3 times per day  . hydrocortisone   Rectal QID  . metoCLOPramide (REGLAN) injection  10 mg Intravenous 3 times per day  . pantoprazole (PROTONIX) IV  40 mg Intravenous QHS  . traZODone  50 mg Oral QHS    Subjective: He still has some abdominal bloating but no nausea or vomiting today. He is feeling a little bit better.  Review of Systems: Pertinent items are noted in HPI.  Past Medical History  Diagnosis Date  . Hypertension   . Elevated cholesterol   . Anginal pain     hx of CP saw Dr Wynonia Lawman 09/20/2013 H&P per chart   . Left Retroperitoneal mass s/p resection/L nephrectomy 07/13/2014   . Postoperative anemia due to acute blood loss 07/15/2014  . SBO (small bowel obstruction) 08/01/2014, 10/11/14  . Retroperitoneal fluid collection   . 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 Smoking status: Never Smoker   . Smokeless tobacco: Never Used  . Alcohol Use: No    Family History  Problem Relation Age of Onset  . Adopted: Yes  . Family history unknown: Yes   Allergies  Allergen Reactions  . Compazine [Prochlorperazine] Other (See Comments)    Made pt really lethargic, and sleepy    OBJECTIVE: Blood pressure 125/71, pulse 99, temperature 98 F (36.7 C), temperature source Oral, resp. rate 20, height 5\' 8"  (1.727 m), weight 174 lb 1.6 oz (78.971 kg), SpO2 96 %. General: he is alert and in no distress Abdomen: soft with only minimal diffuse tenderness. Clear serous fluid in left  flank drain  Lab Results Lab Results  Component Value Date   WBC 6.2 10/13/2014   HGB 9.4* 10/13/2014   HCT 27.9* 10/13/2014   MCV 84.8 10/13/2014   PLT 599* 10/13/2014    Lab Results  Component Value Date   CREATININE 1.27* 10/13/2014   BUN 19 10/13/2014   NA 135 10/13/2014   K 3.5 10/13/2014   CL 107 10/13/2014   CO2 23 10/13/2014    Lab Results  Component Value Date   ALT 22 10/11/2014   AST 24 10/11/2014   ALKPHOS 101 10/11/2014   BILITOT 0.5 10/11/2014     Microbiology: Recent Results (from the past 240 hour(s))  Culture, blood (routine x 2)     Status: None (Preliminary result)   Collection Time: 10/12/14  5:15 AM  Result Value Ref Range Status   Specimen Description BLOOD RIGHT FOREARM  Final   Special Requests BOTTLES DRAWN AEROBIC AND ANAEROBIC 5ML  Final   Culture   Final    NO GROWTH 1 DAY Performed at [facility]    Report Status PENDING  Incomplete  Culture, blood (routine x 2)     Status: None (Preliminary result)   Collection Time: 10/12/14  5:25 AM  Result Value Ref Range Status   Specimen Description BLOOD RIGHT ANTECUBITAL  Final   Special Requests BOTTLES DRAWN AEROBIC AND ANAEROBIC 5ML  Final   Culture   Final    NO GROWTH 1 DAY Performed at [facility]    Report Status PENDING  Incomplete  Culture, routine-abscess     Status: None (Preliminary result)   Collection Time: 10/12/14  3:21 PM  Result Value Ref Range Status   Specimen Description Retroperiton  Final   Special Requests Normal  Final   Gram Stain   Final    ABUNDANT WBC PRESENT, PREDOMINANTLY PMN NO SQUAMOUS EPITHELIAL CELLS SEEN ABUNDANT GRAM POSITIVE COCCI IN PAIRS IN CLUSTERS Performed at [facility]    Culture   Final    NO GROWTH 2 DAYS Performed at [facility]    Report Status PENDING  Incomplete  Anaerobic culture     Status: None (Preliminary result)   Collection Time: 10/12/14  3:21 PM  Result Value Ref Range Status    Specimen Description ABSCESS  Final   Special Requests Normal  Final   Gram Stain   Final    ABUNDANT WBC PRESENT, PREDOMINANTLY PMN NO SQUAMOUS EPITHELIAL CELLS SEEN ABUNDANT GRAM POSITIVE COCCI IN PAIRS IN CLUSTERS Performed at [facility]    Culture   Final    NO ANAEROBES ISOLATED; CULTURE IN PROGRESS FOR 5 DAYS Performed at [facility]    Report Status PENDING  Incomplete  Fungus culture w smear     Status: None (Preliminary result)   Collection Time: 10/12/14  3:21 PM  Result Value Ref Range Status   Specimen Description ABSCESS  Final   Special Requests Normal  Final   Fungal Smear   Final    NO YEAST OR FUNGAL ELEMENTS SEEN Performed at [facility]    Culture   Final    CULTURE IN PROGRESS FOR FOUR WEEKS Performed at [facility]    Report Status PENDING  Incomplete    Assessment: He has a left retroperitoneal abscess. Gram stain reveals gram-positive cocci in pairs and clusters. He may need to expand coverage for MRSA but since he is afebrile and clinically stable I will simply continue ertapenem for now pending the results of abscess cultures.  Plan: 1. Continue ertapenem pending culture results  Michel Bickers, MD [facility] [place] [phone] pager   [phone] cell 10/14/2014, 10:51 AM

## 2014-10-14 NOTE — Progress Notes (Addendum)
Pt has very small brown soft stool. No frank blood noted.    Sent to lab and results came back guiac +.   Pt reports hx of hemorrhoids with bleeding and tenderness.  Area cleansed and Anusol applied by pt (refusing to let RN assist)

## 2014-10-15 ENCOUNTER — Inpatient Hospital Stay (HOSPITAL_COMMUNITY): Payer: Medicare Other

## 2014-10-15 NOTE — Progress Notes (Signed)
Referring Physician(s): CCS  Subjective:  Retroperitoneal abscess drain placed 7/14 Sl better Less painful but still "sick" Weak; tired   Allergies: Compazine  Medications: Prior to Admission medications   Medication Sig Start Date End Date Taking? Authorizing Provider  LORazepam (ATIVAN) 1 MG tablet Take 1 tablet by mouth 3 (three) times daily as needed for anxiety. Anxiety/nausea 09/01/14  Yes Historical Provider, MD  Melatonin 5 MG CAPS Take 1 capsule by mouth at bedtime.  07/27/14  Yes Historical Provider, MD  metoCLOPramide (REGLAN) 10 MG/10ML SOLN Take 10 mLs (10 mg total) by mouth 4 (four) times daily -  before meals and at bedtime. 08/21/14  Yes Tyler Pita, MD  Multiple Vitamin (MULTIVITAMIN WITH MINERALS) TABS tablet Take 1 tablet by mouth every morning.    Yes Historical Provider, MD  pantoprazole (PROTONIX) 40 MG tablet Take 1 tablet (40 mg total) by mouth daily. 09/22/14  Yes Donne Hazel, MD  traZODone (DESYREL) 50 MG tablet Take 50 mg by mouth at bedtime.    Yes Historical Provider, MD  ondansetron (ZOFRAN ODT) 8 MG disintegrating tablet Take 1 tablet (8 mg total) by mouth every 8 (eight) hours as needed for nausea or vomiting. 10/11/14   Jola Schmidt, MD  oxyCODONE-acetaminophen (PERCOCET/ROXICET) 5-325 MG per tablet Take 1-2 tablets by mouth every 6 (six) hours as needed for severe pain. 08/04/14   Stark Klein, MD     Vital Signs: BP 132/70 mmHg  Pulse 90  Temp(Src) 98.5 F (36.9 C) (Oral)  Resp 20  Ht 5' 8"  (1.727 m)  Wt 174 lb 1.6 oz (78.971 kg)  BMI 26.48 kg/m2  SpO2 100%  Physical Exam  Skin: Skin is warm.  Site of L RP abscess drain  Intact NT no bleeding Output milky yellow 45 cc yesterday 20 cc in JP No growth  afeb Wbc wnl    Imaging: Ct Abdomen Pelvis W Contrast  10/12/2014   CLINICAL DATA:  69 year old male with nausea and vomiting. History of bowel obstruction history of left retroperitoneal liposarcoma status post resection   EXAM: CT ABDOMEN AND PELVIS WITH CONTRAST  TECHNIQUE: Multidetector CT imaging of the abdomen and pelvis was performed using the standard protocol following bolus administration of intravenous contrast.  CONTRAST:  22m OMNIPAQUE IOHEXOL 300 MG/ML  SOLN  COMPARISON:  CT dated 08/29/2014  FINDINGS: Partially visualized small left pleural effusion. There is atelectatic changes of the left lung base. Pneumonia is not excluded. No intra-abdominal free air. There is diffuse mesenteric stranding and small perihepatic and right lower quadrant free fluid.  Subcentimeter hepatic hypodense lesion is too small to characterize. The liver is otherwise unremarkable. The gallbladder, pancreas, spleen, right adrenal gland appear unremarkable. Stable right renal hypodense lesion, likely a cyst. There is no hydronephrosis on the right. The visualized right ureter and urinary bladder as well as the prostate and seminal vesicles are grossly unremarkable.  The left adrenal gland is not visualized. Postsurgical changes of left nephrectomy. There is a 7.2 x 4.0 cm complex fluid collection containing small pockets of air in the left retroperitoneum and along the left psoas muscle compatible with an abscess. There has been interval removal of the percutaneous drainage catheter with interval increase in size of this collection compared to the prior study. Multiple loculated fluid noted along the greater curvature of the stomach and superior to the pancreas compatible with multiloculated abscesses. There has been interval increase in the size of this abscess is compared to the prior study.  The largest pocket measures approximately 4.3 x 2.6 cm (series 6, image 8). A tract like structure containing pockets of air noted extending from the abscesses along the stomach wall to the larger abscess in the left retroperitoneum along the psoas muscle.  An enteric tube is noted with tip in the stomach. There is dilatation of loops of proximal and mid  small bowel. The distal small bowel and terminal ileum are collapsed. The transition zone is noted in the left upper abdomen (coronal series 5, image 48 and axial series 2, image 33) adjacent to the retroperitoneal abscess and surgical clips. This may be related to surrounding inflammation or adhesion. There is extensive colonic diverticulosis. There is focal inflammation and thickening of the upper descending colon adjacent to the retroperitoneal abscess, likely secondary to adjacent inflammation. The appendix appears unremarkable.  There is aortoiliac atherosclerotic disease. No portal venous gas. Midline vertical anterior abdominal wall incisional scar. Degenerative changes of the spine. No acute fracture.  IMPRESSION: Interval removal of the left posterior percutaneous drainage catheter with interval increase in the size of the left retroperitoneal abscess. Multiple smaller abscesses noted along the posterior wall and greater curvature of the stomach which also have increased in size compared to the prior study. These collections appear to communicate with the larger abscess in the left retroperitoneum along the psoas muscle.  Small-bowel obstruction with transition zone in the left upper abdomen adjacent to the retroperitoneal collection. The cause of the obstruction may be related to adjacent inflammation or adhesions.  Small left pleural effusion with partial left lower lobe atelectasis/pneumonia.   Electronically Signed   By: Anner Crete M.D.   On: 10/12/2014 04:00   Ir Fluoro Guide Cv Line Right  10/12/2014   INDICATION: Retroperitoneal Abscess: Need for Central Venous Access for Prolonged Intravenous Antibiotics  EXAM: ULTRASOUND AND FLUOROSCOPIC GUIDED PICC LINE INSERTION  MEDICATIONS: None.  CONTRAST:  None  FLUOROSCOPY TIME:  6 seconds.  COMPLICATIONS: None immediate  TECHNIQUE: The procedure, risks, benefits, and alternatives were explained to the patient and informed written consent was  obtained. A timeout was performed prior to the initiation of the procedure.  The right upper extremity was prepped with chlorhexidine in a sterile fashion, and a sterile drape was applied covering the operative field. Maximum barrier sterile technique with sterile gowns and gloves were used for the procedure. A timeout was performed prior to the initiation of the procedure. Local anesthesia was provided with 1% lidocaine.  Under direct ultrasound guidance, the right brachial vein was accessed with a micropuncture kit after the overlying soft tissues were anesthetized with 1% lidocaine. An ultrasound image was saved for documentation purposes. A guidewire was advanced to the level of the superior caval-atrial junction for measurement purposes and the PICC line was cut to length. A peel-away sheath was placed and a 39 cm, 5 [place], dual lumen was inserted to level of the superior caval-atrial junction. A post procedure spot fluoroscopic was obtained. The catheter easily aspirated and flushed and was sutured in place. A dressing was placed. The patient tolerated the procedure well without immediate post procedural complication.  FINDINGS: After catheter placement, the tip lies within the superior cavoatrial junction. The catheter aspirates and flushes normally and is ready for immediate use.  IMPRESSION: Successful ultrasound and fluoroscopic guided placement of a right brachial vein approach, 39 cm, 5 French, dual lumen PICC with tip at the superior caval-atrial junction. The PICC line is ready for immediate use.  Read By:  Abigail Butts  Benjie Karvonen, PA-C   Electronically Signed   By: Jacqulynn Cadet M.D.   On: 10/12/2014 13:50   Ir US Guide Vasc Access Right  10/12/2014   INDICATION: Retroperitoneal Abscess: Need for Central Venous Access for Prolonged Intravenous Antibiotics  EXAM: ULTRASOUND AND FLUOROSCOPIC GUIDED PICC LINE INSERTION  MEDICATIONS: None.  CONTRAST:  None  FLUOROSCOPY TIME:  6 seconds.  COMPLICATIONS: None  immediate  TECHNIQUE: The procedure, risks, benefits, and alternatives were explained to the patient and informed written consent was obtained. A timeout was performed prior to the initiation of the procedure.  The right upper extremity was prepped with chlorhexidine in a sterile fashion, and a sterile drape was applied covering the operative field. Maximum barrier sterile technique with sterile gowns and gloves were used for the procedure. A timeout was performed prior to the initiation of the procedure. Local anesthesia was provided with 1% lidocaine.  Under direct ultrasound guidance, the right brachial vein was accessed with a micropuncture kit after the overlying soft tissues were anesthetized with 1% lidocaine. An ultrasound image was saved for documentation purposes. A guidewire was advanced to the level of the superior caval-atrial junction for measurement purposes and the PICC line was cut to length. A peel-away sheath was placed and a 39 cm, 5 [place], dual lumen was inserted to level of the superior caval-atrial junction. A post procedure spot fluoroscopic was obtained. The catheter easily aspirated and flushed and was sutured in place. A dressing was placed. The patient tolerated the procedure well without immediate post procedural complication.  FINDINGS: After catheter placement, the tip lies within the superior cavoatrial junction. The catheter aspirates and flushes normally and is ready for immediate use.  IMPRESSION: Successful ultrasound and fluoroscopic guided placement of a right brachial vein approach, 39 cm, 5 French, dual lumen PICC with tip at the superior caval-atrial junction. The PICC line is ready for immediate use.  Read By:  Gareth Eagle, PA-C   Electronically Signed   By: Jacqulynn Cadet M.D.   On: 10/12/2014 13:50   Dg Abd 2 Views  10/15/2014   CLINICAL DATA:  Nasogastric tube in place there follow-up small bowel obstruction. Patient reports back pain when moving. History of left  kidney drain.  EXAM: ABDOMEN - 2 VIEW  COMPARISON:  10/14/2014 and earlier  FINDINGS: Nasogastric tube is in place with tip overlying the region of the stomach. There is dense opacity in the left lower lobe of the lung consistent with atelectasis, consolidation and pleural effusion.  Surgical clips and drain are identified in the left upper quadrant as before.  There is moderate dilatation of small bowel loops which contain air-fluid levels on the erect view. The degree of dilatation is similar compared prior studies. However fewer bowel loops appear dilated compared with prior studies. There has been interval evacuation of some of the oral contrast from the colon since prior study.  IMPRESSION: 1. Persistent small bowel dilatation. 2. Interval evacuation of some of the contrast from the colon. 3. Left lower lobe atelectasis/consolidation and effusion.   Electronically Signed   By: Nolon Nations M.D.   On: 10/15/2014 09:10   Dg Abd Acute W/chest  10/12/2014   CLINICAL DATA:  Nausea and vomiting since yesterday. 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 Nonsmoker.  EXAM: DG ABDOMEN ACUTE W/ 1V CHEST  COMPARISON:  CT abdomen and pelvis 08/29/2014. Abdominal series [id number].  FINDINGS: Blunting of the left costophrenic angle without change since previous study, likely representing pleural  thickening. Normal heart size and pulmonary vascularity. No focal airspace disease or consolidation in the lungs. No pneumothorax. Mediastinal contours appear intact.  Surgical clips in the left upper quadrant. Interval development of prominent gaseous distention of mid abdominal small bowel with paucity of gas in the colon. Multiple air-fluid levels. Findings suggest small bowel obstruction. No free intra-abdominal air. No radiopaque stones. Degenerative changes in the spine and hips.  IMPRESSION: Gaseous distention of small bowel with air-fluid levels consistent with small bowel obstruction.    Electronically Signed   By: Lucienne Capers M.D.   On: 10/12/2014 00:27   Dg Abd Portable 1v  10/14/2014   CLINICAL DATA:  Small-bowel obstruction. History of left retroperitoneal mass, retroperitoneal fluid collection, resection of retroperitoneal mass.  EXAM: PORTABLE ABDOMEN - 1 VIEW  COMPARISON:  7152 I  FINDINGS: There is residual contrast in the colon following recent CT exam. A left-sided percutaneous drain remains in place in the regional numerous surgical clips. There is dilatation of small bowel loops in the central abdomen. There has been slight interval improvement in dilatation since previous exam.  IMPRESSION: 1. Persistent small bowel obstruction showing slight interval decrease in distension of small bowel loops. 2. Persistent left upper quadrant drain. 3. Residual contrast in the colon.   Electronically Signed   By: Nolon Nations M.D.   On: 10/14/2014 07:26   Dg Abd Portable 1v  10/13/2014   CLINICAL DATA:  Followup small bowel obstruction. Abdominal abscess. Retroperitoneal liposarcoma.  EXAM: PORTABLE ABDOMEN - 1 VIEW  COMPARISON:  10/11/2014  FINDINGS: A new nasogastric tube is seen with tip in the proximal stomach. Decreased small bowel dilatation is seen compared to previous study. Oral contrast from recent CT is now seen in the colon.  A new percutaneous drainage catheter is seen in the left upper quadrant. Multiple surgical clips again seen in left quadrant.  IMPRESSION: Decreased small bowel dilatation since prior exam. Nasogastric tube in appropriate position.   Electronically Signed   By: Earle Gell M.D.   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 Barry Dienes. This was complicated by development of a retroperitoneal abscess. Dr. Pascal Lux placed a drain on 08/15/2014. Patient then underwent a drain/fistula check  by Dr. Pascal Lux on 09/26/2014. There was no evidence of fistulous connection to any adjacent organ and the drainage catheter was removed.He had been doing well until Tuesday afternoon when he developed nausea, vomiting and bloating. CT shows an increase of the left retroperitoneal abscess with multiple smaller abscesses which communicate with the larger one and a small bowel obstruction with transition zone in the left upper abdomen. Patient presents for replacement of percutaneous drainage catheter.  EXAM: CT IMAGE GUIDED DRAINAGE BY PERCUTANEOUS CATHETER  Date: 10/12/2014  PROCEDURE: 1. Placement of a 12 French drainage catheter modified with additional sideholes into the left retroperitoneal abscess Interventional Radiologist:  Criselda Peaches, MD  ANESTHESIA/SEDATION: Moderate (conscious) sedation was used. 4 mg Versed, 50 mcg Fentanyl were administered intravenously. The patient's vital signs were monitored continuously by radiology nursing throughout the procedure.  Sedation Time: 33 minutes  MEDICATIONS: None additional  TECHNIQUE: Informed consent was obtained from the patient following explanation of the procedure, risks, benefits and alternatives. The patient understands, agrees and consents for the procedure. All questions were addressed. A time out was performed.  A planning  axial CT scan was performed. The complex fluid and gas collection in the left retroperitoneal space was successfully identified. A suitable entry site just medial and inferior to the left twelfth rib was selected. The skin was marked. The region was then sterilely prepped and draped in standard fashion using Betadine skin prep. Local anesthesia was attained by infiltration with 1% lidocaine. A small dermatotomy was made. Under intermittent CT fluoroscopic guidance, an 18 gauge trocar needle was advanced into the inferior aspect of the left retroperitoneal fluid collection. A short Amplatz wire was then advanced cranially within the  fluid collection. The skin tract was then dilated and a Cook 12 [place] all-purpose drainage catheter modified with additional sideholes was advanced over the wire and formed within the abscess cavity. Aspiration yields approximately 100 mL thick, purulent material.  Post aspiration CT imaging demonstrates resolution of fluid, however there is a persistent thick walled cavity containing air which has not collapsed around the catheter. The catheter was secured to the skin with 0 Prolene suture and connected to JP bulb suction.  COMPLICATIONS: None  Estimated blood loss: 0  IMPRESSION: 1. Successful placement of a 12 French drainage catheter modified with additional sideholes into the recurrent left retroperitoneal abscess cavity. Aspiration yields 100 mL thick, purulent material. Samples were sent for culture, anaerobic culture and fungal culture. 2. Following aspiration, the thick walled cavity does not collapse and remains present as an air containing potential space.  Signed,  Criselda Peaches, MD  [facility]  [facility]   Electronically Signed   By: Jacqulynn Cadet M.D.   On: 10/12/2014 17:02    Labs:  CBC:  Recent Labs  09/22/14 0353 10/11/14 1705 10/12/14 0845 10/13/14 0510  WBC 1.7* 7.6 6.4 6.2  HGB 8.1* 8.5* 6.8* 9.4*  HCT 24.7* 25.9* 20.9* 27.9*  PLT 246 690* 578* 599*    COAGS:  Recent Labs  07/13/14 1939  07/14/14 1055 07/14/14 1830 07/15/14 0058 07/15/14 0515 07/16/14 0700 09/18/14 1400  INR 1.42  --  1.20  --   --   --  1.21 1.06  APTT  --   < > 33 36 31 33  --  30  < > = values in this interval not displayed.  BMP:  Recent Labs  09/20/14 0419 10/11/14 1705 10/12/14 0845 10/13/14 0510  NA 135 128* 132* 135  K 4.0 3.0* 3.1* 3.5  CL 102 93* 100* 107  CO2 25 24 23 23   GLUCOSE 105* 125* 102* 111*  BUN 25* 22* 21* 19  CALCIUM 8.2* 8.7* 8.0* 7.9*  CREATININE 1.15 1.31* 1.35* 1.27*  GFRNONAA >60 54* 52* 56*    GFRAA >60 >60 60* >60    LIVER FUNCTION TESTS:  Recent Labs  07/05/14 1350 09/18/14 0959 10/11/14 1705  BILITOT 0.6 <0.20 0.5  AST 21 17 24   ALT 20 12 22   ALKPHOS 58 65 101  PROT 7.5 5.3* 6.6  ALBUMIN 4.2 2.4* 2.4*    Assessment and Plan:  L RP abscess drain in place Will follow Will need Re CT when output less 10 cc /day  Signed: Romy Ipock A 10/15/2014, 11:10 AM   I spent a total of 15 Minutes in face to face in clinical consultation/evaluation, greater than 50% of which was counseling/coordinating care for RP abscess drain

## 2014-10-15 NOTE — Progress Notes (Signed)
Patient ID: Darryl Griffith, male   DOB: 05-13-1945, 69 y.o.   MRN: [id number]         [facility]    Date of Admission:  10/11/2014           Day 4 antibiotic therapy  Active Problems:   Abdominal abscess   Small bowel obstruction   Intra-abdominal fluid collection   . ertapenem  1 g Intravenous QPC supper  . heparin subcutaneous  5,000 Units Subcutaneous 3 times per day  . hydrocortisone   Rectal QID  . metoCLOPramide (REGLAN) injection  10 mg Intravenous 3 times per day  . pantoprazole (PROTONIX) IV  40 mg Intravenous QHS  . traZODone  50 mg Oral QHS   OBJECTIVE: Blood pressure 132/70, pulse 90, temperature 98.5 F (36.9 C), temperature source Oral, resp. rate 20, height 5\' 8"  (1.727 m), weight 174 lb 1.6 oz (78.971 kg), SpO2 100 %.  No drain output recorded  Lab Results Lab Results  Component Value Date   WBC 6.2 10/13/2014   HGB 9.4* 10/13/2014   HCT 27.9* 10/13/2014   MCV 84.8 10/13/2014   PLT 599* 10/13/2014    Lab Results  Component Value Date   CREATININE 1.27* 10/13/2014   BUN 19 10/13/2014   NA 135 10/13/2014   K 3.5 10/13/2014   CL 107 10/13/2014   CO2 23 10/13/2014    Lab Results  Component Value Date   ALT 22 10/11/2014   AST 24 10/11/2014   ALKPHOS 101 10/11/2014   BILITOT 0.5 10/11/2014     Microbiology: Recent Results (from the past 240 hour(s))  Culture, blood (routine x 2)     Status: None (Preliminary result)   Collection Time: 10/12/14  5:15 AM  Result Value Ref Range Status   Specimen Description BLOOD RIGHT FOREARM  Final   Special Requests BOTTLES DRAWN AEROBIC AND ANAEROBIC 5ML  Final   Culture   Final    NO GROWTH 2 DAYS Performed at  [facility]    Report Status PENDING  Incomplete  Culture, blood (routine x 2)     Status: None (Preliminary result)   Collection Time: 10/12/14  5:25 AM  Result Value Ref Range Status   Specimen Description BLOOD RIGHT ANTECUBITAL  Final   Special Requests  BOTTLES DRAWN AEROBIC AND ANAEROBIC 5ML  Final   Culture   Final    NO GROWTH 2 DAYS Performed at [facility]    Report Status PENDING  Incomplete  Culture, routine-abscess     Status: None (Preliminary result)   Collection Time: 10/12/14  3:21 PM  Result Value Ref Range Status   Specimen Description Retroperiton  Final   Special Requests Normal  Final   Gram Stain   Final    ABUNDANT WBC PRESENT, PREDOMINANTLY PMN NO SQUAMOUS EPITHELIAL CELLS SEEN ABUNDANT GRAM POSITIVE COCCI IN PAIRS IN CLUSTERS Performed at [facility]    Culture   Final    NO GROWTH 2 DAYS Performed at [facility]    Report Status PENDING  Incomplete  Anaerobic culture     Status: None (Preliminary result)   Collection Time: 10/12/14  3:21 PM  Result Value Ref Range Status   Specimen Description ABSCESS  Final   Special Requests Normal  Final   Gram Stain   Final    ABUNDANT WBC PRESENT, PREDOMINANTLY PMN NO SQUAMOUS EPITHELIAL CELLS SEEN ABUNDANT GRAM POSITIVE COCCI IN PAIRS IN CLUSTERS Performed at [facility]    Culture   Final    NO ANAEROBES ISOLATED; CULTURE IN PROGRESS FOR 5 DAYS Performed at [facility]    Report Status PENDING  Incomplete  Fungus culture w smear     Status: None (Preliminary result)   Collection Time: 10/12/14  3:21 PM  Result Value Ref Range Status   Specimen Description ABSCESS  Final   Special Requests Normal  Final   Fungal Smear   Final    NO YEAST OR FUNGAL ELEMENTS SEEN Performed at [facility]    Culture   Final    CULTURE IN PROGRESS FOR FOUR WEEKS Performed at [facility]    Report Status PENDING  Incomplete    Assessment: Retroperitoneal abscess cultures remain negative. He remains afebrile. I will continue ertapenem for now.  Plan: 1. Continue ertapenem pending culture results  Michel Bickers, MD [facility] [phone] pager    [phone] cell 10/15/2014, 9:48 AM

## 2014-10-15 NOTE — Progress Notes (Signed)
Patient ID: Darryl Griffith, male   DOB: 1945-05-12, 69 y.o.   MRN: [id number]    Subjective: No C/O this AM, denies abd pain.  Has had several more small BMs  Objective: Vital signs in last 24 hours: Temp:  [98.5 F (36.9 C)-98.7 F (37.1 C)] 98.5 F (36.9 C) (07/17 0414) Pulse Rate:  [90-97] 90 (07/17 0414) Resp:  [20] 20 (07/17 0414) BP: (130-135)/(70-78) 132/70 mmHg (07/17 0414) SpO2:  [97 %-100 %] 100 % (07/17 0414) Last BM Date: 10/14/14  Intake/Output from previous day: 07/16 0701 - 07/17 0700 In: 2805 [P.O.:240; I.V.:2475; NG/GT:30; IV Piggyback:50] Out: 2192 [Urine:450; Emesis/NG output:1700; Drains:42] Intake/Output this shift:    General appearance: alert, cooperative and no distress Resp: clear to auscultation bilaterally GI: normal findings: soft, non-tender and no apparent distention  Lab Results:   Recent Labs  10/12/14 0845 10/13/14 0510  WBC 6.4 6.2  HGB 6.8* 9.4*  HCT 20.9* 27.9*  PLT 578* 599*   BMET  Recent Labs  10/12/14 0845 10/13/14 0510  NA 132* 135  K 3.1* 3.5  CL 100* 107  CO2 23 23  GLUCOSE 102* 111*  BUN 21* 19  CREATININE 1.35* 1.27*  CALCIUM 8.0* 7.9*   Recent Results (from the past 240 hour(s))  Culture, blood (routine x 2)     Status: None (Preliminary result)   Collection Time: 10/12/14  5:15 AM  Result Value Ref Range Status   Specimen Description BLOOD RIGHT FOREARM  Final   Special Requests BOTTLES DRAWN AEROBIC AND ANAEROBIC 5ML  Final   Culture   Final    NO GROWTH 2 DAYS Performed at [facility]    Report Status PENDING  Incomplete  Culture, blood (routine x 2)     Status: None (Preliminary result)   Collection Time: 10/12/14  5:25 AM  Result Value Ref Range Status   Specimen Description BLOOD RIGHT ANTECUBITAL  Final   Special Requests BOTTLES DRAWN AEROBIC AND ANAEROBIC 5ML  Final   Culture   Final    NO GROWTH 2 DAYS Performed at [facility]    Report Status PENDING  Incomplete  Culture,  routine-abscess     Status: None (Preliminary result)   Collection Time: 10/12/14  3:21 PM  Result Value Ref Range Status   Specimen Description Retroperiton  Final   Special Requests Normal  Final   Gram Stain   Final    ABUNDANT WBC PRESENT, PREDOMINANTLY PMN NO SQUAMOUS EPITHELIAL CELLS SEEN ABUNDANT GRAM POSITIVE COCCI IN PAIRS IN CLUSTERS Performed at [facility]    Culture   Final    NO GROWTH 2 DAYS Performed at [facility]    Report Status PENDING  Incomplete  Anaerobic culture     Status: None (Preliminary result)   Collection Time: 10/12/14  3:21 PM  Result Value Ref Range Status   Specimen Description ABSCESS  Final   Special Requests Normal  Final   Gram Stain   Final    ABUNDANT WBC PRESENT, PREDOMINANTLY PMN NO SQUAMOUS EPITHELIAL CELLS SEEN ABUNDANT GRAM POSITIVE COCCI IN PAIRS IN CLUSTERS Performed at [facility]    Culture   Final    NO ANAEROBES ISOLATED; CULTURE IN PROGRESS FOR 5 DAYS Performed at [facility]    Report Status PENDING  Incomplete  Fungus culture w smear     Status: None (Preliminary result)   Collection Time: 10/12/14  3:21 PM  Result Value Ref Range Status  Specimen Description ABSCESS  Final   Special Requests Normal  Final   Fungal Smear   Final    NO YEAST OR FUNGAL ELEMENTS SEEN Performed at [facility]    Culture   Final    CULTURE IN PROGRESS FOR FOUR WEEKS Performed at [facility]    Report Status PENDING  Incomplete    Studies/Results: Dg Abd Portable 1v  10/14/2014   CLINICAL DATA:  Small-bowel obstruction. History of left retroperitoneal mass, retroperitoneal fluid collection, resection of retroperitoneal mass.  EXAM: PORTABLE ABDOMEN - 1 VIEW  COMPARISON:  7152 I  FINDINGS: There is residual contrast in the colon following recent CT exam. A left-sided percutaneous drain remains in place in the regional numerous surgical clips. There is dilatation of small bowel  loops in the central abdomen. There has been slight interval improvement in dilatation since previous exam.  IMPRESSION: 1. Persistent small bowel obstruction showing slight interval decrease in distension of small bowel loops. 2. Persistent left upper quadrant drain. 3. Residual contrast in the colon.   Electronically Signed   By: Nolon Nations M.D.   On: 10/14/2014 07:26   Dg Abd Portable 1v  10/13/2014   CLINICAL DATA:  Followup small bowel obstruction. Abdominal abscess. Retroperitoneal liposarcoma.  EXAM: PORTABLE ABDOMEN - 1 VIEW  COMPARISON:  10/11/2014  FINDINGS: A new nasogastric tube is seen with tip in the proximal stomach. Decreased small bowel dilatation is seen compared to previous study. Oral contrast from recent CT is now seen in the colon.  A new percutaneous drainage catheter is seen in the left upper quadrant. Multiple surgical clips again seen in left quadrant.  IMPRESSION: Decreased small bowel dilatation since prior exam. Nasogastric tube in appropriate position.   Electronically Signed   By: Earle Gell M.D.   On: 10/13/2014 08:51    Anti-infectives: Anti-infectives    Start     Dose/Rate Route Frequency Ordered Stop   10/13/14 1845  ertapenem (INVANZ) 1 g in sodium chloride 0.9 % 50 mL IVPB     1 g 100 mL/hr over 30 Minutes Intravenous Daily after supper 10/13/14 1816     10/12/14 2000  piperacillin-tazobactam (ZOSYN) IVPB 3.375 g  Status:  Discontinued     3.375 g 12.5 mL/hr over 240 Minutes Intravenous Every 8 hours 10/12/14 1923 10/13/14 1816   10/12/14 0500  vancomycin (VANCOCIN) 1,500 mg in sodium chloride 0.9 % 500 mL IVPB     1,500 mg 250 mL/hr over 120 Minutes Intravenous  Once 10/12/14 0454 10/12/14 0725   10/12/14 0500  piperacillin-tazobactam (ZOSYN) IVPB 4.5 g  Status:  Discontinued     4.5 g 200 mL/hr over 30 Minutes Intravenous  Once 10/12/14 0454 10/12/14 0457   10/12/14 0500  piperacillin-tazobactam (ZOSYN) IVPB 3.375 g     3.375 g 100 mL/hr over 30  Minutes Intravenous  Once 10/12/14 0458 10/12/14 0550      Assessment/Plan: 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 Films pending today today and abdomen benign with bowel function. Will continue with conservative management. Consider removal of NG today if x-rays improved  -bowel rest, NGT to LIWS  Multiple intra-abdominal abscesses-s/p drain 7/14. Follow cultures. Gm + cocci on gm stain, On ZosynD #3. Will need long term antibiotics for multiple abscesses not amendable to drainage. ID following.   Anemia of chronic disease-s/p  2 units of PRBCs 7/14. H&h improved 9.4/27.9.  renal insufficiency- sCr improved, adequate UOP. Continue IVF. Avoid nephrotoxins.  FEN-K normalized. IVF, NPO.  Lab in AM  VTE prophylaxis-SCD. Plan to resume heparin Hemmorhoids- Start annusol [facility] Dispo-continue inpatient.     LOS: 3 days    Germani Gavilanes T 10/15/2014

## 2014-10-15 NOTE — Progress Notes (Signed)
CTM reports an "new Vent rhythm around 2130.   However, when researched further it is the same rhythm confirmed with EKG on 10/13/14 as a BBB and that has been occurring intermittently this admission as well.  Pt VSS and is asymptomatic.  Will continue to monitor closely.

## 2014-10-16 ENCOUNTER — Inpatient Hospital Stay (HOSPITAL_COMMUNITY): Payer: Medicare Other

## 2014-10-16 DIAGNOSIS — K5669 Other intestinal obstruction: Secondary | ICD-10-CM

## 2014-10-16 DIAGNOSIS — C499 Malignant neoplasm of connective and soft tissue, unspecified: Secondary | ICD-10-CM

## 2014-10-16 LAB — CBC
HEMATOCRIT: 30.7 % — AB (ref 39.0–52.0)
Hemoglobin: 9.8 g/dL — ABNORMAL LOW (ref 13.0–17.0)
MCH: 27.9 pg (ref 26.0–34.0)
MCHC: 31.9 g/dL (ref 30.0–36.0)
MCV: 87.5 fL (ref 78.0–100.0)
PLATELETS: 634 10*3/uL — AB (ref 150–400)
RBC: 3.51 MIL/uL — ABNORMAL LOW (ref 4.22–5.81)
RDW: 16.5 % — ABNORMAL HIGH (ref 11.5–15.5)
WBC: 7.2 10*3/uL (ref 4.0–10.5)

## 2014-10-16 LAB — BASIC METABOLIC PANEL
Anion gap: 5 (ref 5–15)
BUN: 10 mg/dL (ref 6–20)
CO2: 25 mmol/L (ref 22–32)
Calcium: 8.2 mg/dL — ABNORMAL LOW (ref 8.9–10.3)
Chloride: 110 mmol/L (ref 101–111)
Creatinine, Ser: 1.17 mg/dL (ref 0.61–1.24)
Glucose, Bld: 108 mg/dL — ABNORMAL HIGH (ref 65–99)
Potassium: 4.1 mmol/L (ref 3.5–5.1)
SODIUM: 140 mmol/L (ref 135–145)

## 2014-10-16 LAB — CULTURE, ROUTINE-ABSCESS
CULTURE: NO GROWTH
SPECIAL REQUESTS: NORMAL

## 2014-10-16 LAB — AMYLASE, PERITONEAL FLUID: AMYLASE, PERITONEAL FLUID: 7 U/L

## 2014-10-16 NOTE — Care Management Important Message (Signed)
Important Message  Patient Details  Name: Diago Haik MRN: [id number] Date of Birth: [birth year]   Medicare Important Message Given:  [facility] notification given    Camillo Flaming 10/16/2014, 12:01 [place] Message  Patient Details  Name: Arsal Tappan MRN: [id number] Date of Birth: November 28, 1945   Medicare Important Message Given:  Yes-second notification given    Camillo Flaming 10/16/2014, 12:01 PM

## 2014-10-16 NOTE — Progress Notes (Signed)
Referring Physician(s): CCS  Subjective: Retroperitoneal abscess drain placed 7/14 Feeling some better. Starting to have some bowel activity.   Allergies: Compazine  Medications:  Current facility-administered medications:  .  chlorproMAZINE (THORAZINE) 12.5 mg in sodium chloride 0.9 % 25 mL IVPB, 12.5 mg, Intravenous, Q6H PRN, Emina Riebock, NP, 12.5 mg at 10/12/14 1250 .  dextrose 5 % and 0.9 % NaCl with KCl 20 mEq/L infusion, , Intravenous, Continuous, Emina Riebock, NP, Last Rate: 100 mL/hr at 10/16/14 1146 .  ertapenem (INVANZ) 1 g in sodium chloride 0.9 % 50 mL IVPB, 1 g, Intravenous, QPC supper, Thayer Headings, MD, 1 g at 10/15/14 1719 .  heparin injection 5,000 Units, 5,000 Units, Subcutaneous, 3 times per day, Excell Seltzer, MD, 5,000 Units at 10/16/14 1352 .  hydrocortisone (ANUSOL-HC) 2.5 % rectal cream, , Rectal, QID, Excell Seltzer, MD .  LORazepam (ATIVAN) injection 1 mg, 1 mg, Intravenous, Q8H PRN, Emina Riebock, NP, 1 mg at 10/15/14 2223 .  metoCLOPramide (REGLAN) injection 10 mg, 10 mg, Intravenous, 3 times per day, Emina Riebock, NP, 10 mg at 10/16/14 1353 .  morphine 2 MG/ML injection 2 mg, 2 mg, Intravenous, Q3H PRN, Emina Riebock, NP .  ondansetron (ZOFRAN) injection 4-8 mg, 4-8 mg, Intravenous, Q6H PRN, Emina Riebock, NP .  pantoprazole (PROTONIX) injection 40 mg, 40 mg, Intravenous, QHS, Emina Riebock, NP, 40 mg at 10/15/14 2221 .  sodium chloride 0.9 % injection 10-40 mL, 10-40 mL, Intracatheter, PRN, Md Ccs, MD, 10 mL at 10/16/14 0936 .  traZODone (DESYREL) tablet 50 mg, 50 mg, Oral, QHS, Emina Riebock, NP, 50 mg at 10/13/14 2131    Vital Signs: BP 122/62 mmHg  Pulse 93  Temp(Src) 98.2 F (36.8 C) (Oral)  Resp 18  Ht 5\' 8"  (1.727 m)  Wt 174 lb 1.6 oz (78.971 kg)  BMI 26.48 kg/m2  SpO2 99%  Physical Exam  Skin:  Site of L RP abscess drain  Intact NT no bleeding Output milky yellow 35cc output recorded    Imaging:  CT IMAGE GUIDED  DRAINAGE BY PERCUTANEOUS CATHETER  Date: 10/12/2014  PROCEDURE:1. Successful placement of a 12 French drainage catheter modified with additional sideholes into the recurrent left retroperitoneal abscess cavity. Aspiration yields 100 mL thick, purulent material. Samples were sent for culture, anaerobic culture and fungal culture. 2. Following aspiration, the thick walled cavity does not collapse and remains present as an air containing potential space.  Signed,  Criselda Peaches, MD   Labs:  CBC:  Recent Labs  10/11/14 1705 10/12/14 0845 10/13/14 0510 10/16/14 0612  WBC 7.6 6.4 6.2 7.2  HGB 8.5* 6.8* 9.4* 9.8*  HCT 25.9* 20.9* 27.9* 30.7*  PLT 690* 578* 599* 634*    COAGS:  Recent Labs  07/13/14 1939  07/14/14 1055 07/14/14 1830 07/15/14 0058 07/15/14 0515 07/16/14 0700 09/18/14 1400  INR 1.42  --  1.20  --   --   --  1.21 1.06  APTT  --   < > 33 36 31 33  --  30  < > = values in this interval not displayed.  BMP:  Recent Labs  10/11/14 1705 10/12/14 0845 10/13/14 0510 10/16/14 0612  NA 128* 132* 135 140  K 3.0* 3.1* 3.5 4.1  CL 93* 100* 107 110  CO2 24 23 23 25   GLUCOSE 125* 102* 111* 108*  BUN 22* 21* 19 10  CALCIUM 8.7* 8.0* 7.9* 8.2*  CREATININE 1.31* 1.35* 1.27* 1.17  GFRNONAA 54* 52*  56* >60  GFRAA >60 60* >60 >60    LIVER FUNCTION TESTS:  Recent Labs  07/05/14 1350 09/18/14 0959 10/11/14 1705  BILITOT 0.6 <0.20 0.5  AST 21 17 24   ALT 20 12 22   ALKPHOS 58 65 101  PROT 7.5 5.3* 6.6  ALBUMIN 4.2 2.4* 2.4*    Assessment and Plan:  L RP abscess drain in place Will follow Will need Re CT when output less 10 cc /day  Signed: Ascencion Dike 10/16/2014, 2:32 PM   I spent a total of 15 Minutes in face to face in clinical consultation/evaluation, greater than 50% of which was counseling/coordinating care for RP abscess drain

## 2014-10-16 NOTE — Progress Notes (Signed)
[facility] Progress Note     Subjective: Finally passed gas while I was in the room for the first time.  He had some abdominal cramping leading up to this.  He denies N/V.  Ambulating some OOB.  Having some BM's.     Objective: Vital signs in last 24 hours: Temp:  [98.2 F (36.8 C)-98.8 F (37.1 C)] 98.8 F (37.1 C) 13-Nov-2022 0504) Pulse Rate:  [90-98] 98 11/13/22 0504) Resp:  [18-20] 19 13-Nov-2022 0504) BP: (130-136)/(76-80) 133/80 mmHg 2022-11-13 0504) SpO2:  [98 %] 98 % 2022-11-13 0504) Last BM Date: 10/15/14  Intake/Output from previous day: 07/17 0701 - 11/13/2022 0700 In: 2580 [P.O.:180; I.V.:2310; NG/GT:30; IV Piggyback:50] Out: 1937 [Urine:1600; Emesis/NG output:900; Drains:35] Intake/Output this shift:    PE: Gen:  Alert, NAD, pleasant Abd: Soft, ND, minimal tenderness, +BS, no HSM, abdominal scars noted   Lab Results:   Recent Labs  11/13/14 0612  WBC 7.2  HGB 9.8*  HCT 30.7*  PLT 634*   BMET  Recent Labs  11/13/2014 0612  NA 140  K 4.1  CL 110  CO2 25  GLUCOSE 108*  BUN 10  CREATININE 1.17  CALCIUM 8.2*   PT/INR No results for input(s): LABPROT, INR in the last 72 hours. CMP     Component Value Date/Time   NA 140 11-13-2014 0612   NA 131* 09/18/2014 0959   K 4.1 2014/11/13 0612   K 4.4 09/18/2014 0959   CL 110 Nov 13, 2014 0612   CO2 25 11/13/2014 0612   CO2 26 09/18/2014 0959   GLUCOSE 108* 11/13/2014 0612   GLUCOSE 159* 09/18/2014 0959   BUN 10 11-13-2014 0612   BUN 38.6* 09/18/2014 0959   CREATININE 1.17 2014-11-13 0612   CREATININE 1.0 09/18/2014 0959   CALCIUM 8.2* November 13, 2014 0612   CALCIUM 8.8 09/18/2014 0959   PROT 6.6 10/11/2014 1705   PROT 5.3* 09/18/2014 0959   ALBUMIN 2.4* 10/11/2014 1705   ALBUMIN 2.4* 09/18/2014 0959   AST 24 10/11/2014 1705   AST 17 09/18/2014 0959   ALT 22 10/11/2014 1705   ALT 12 09/18/2014 0959   ALKPHOS 101 10/11/2014 1705   ALKPHOS 65 09/18/2014 0959   BILITOT 0.5 10/11/2014 1705   BILITOT <0.20  09/18/2014 0959   GFRNONAA >60 13-Nov-2014 0612   GFRAA >60 November 13, 2014 0612   Lipase     Component Value Date/Time   LIPASE 17* 10/11/2014 1705       Studies/Results: Dg Abd 2 Views  2014/11/13   CLINICAL DATA:  Persistent abdominal pain and distention  EXAM: ABDOMEN - 2 VIEW  COMPARISON:  October 15, 2014  FINDINGS: Supine and upright images obtained. Compared to 1 day prior, there is no longer appreciable small bowel dilatation. Multiple air-fluid levels, however, do remain. No free air. Nasogastric tube tip and side port in stomach. Drain in the left abdomen remains. Multiple surgical clips are noted on the left, stable.  IMPRESSION: Resolution of bowel dilatation. Multiple air-fluid levels remain. Nasogastric tube tip and side port in stomach. Suspect partial but incomplete resolution of bowel obstruction. No free air.   Electronically Signed   By: Lowella Grip III M.D.   On: 2014/11/13 07:29   Dg Abd 2 Views  10/15/2014   CLINICAL DATA:  Nasogastric tube in place there follow-up small bowel obstruction. Patient reports back pain when moving. History of left kidney drain.  EXAM: ABDOMEN - 2 VIEW  COMPARISON:  10/14/2014 and earlier  FINDINGS: Nasogastric tube is  in place with tip overlying the region of the stomach. There is dense opacity in the left lower lobe of the lung consistent with atelectasis, consolidation and pleural effusion.  Surgical clips and drain are identified in the left upper quadrant as before.  There is moderate dilatation of small bowel loops which contain air-fluid levels on the erect view. The degree of dilatation is similar compared prior studies. However fewer bowel loops appear dilated compared with prior studies. There has been interval evacuation of some of the oral contrast from the colon since prior study.  IMPRESSION: 1. Persistent small bowel dilatation. 2. Interval evacuation of some of the contrast from the colon. 3. Left lower lobe atelectasis/consolidation  and effusion.   Electronically Signed   By: Nolon Nations M.D.   On: 10/15/2014 09:10    Anti-infectives: Anti-infectives    Start     Dose/Rate Route Frequency Ordered Stop   10/13/14 1845  ertapenem (INVANZ) 1 g in sodium chloride 0.9 % 50 mL IVPB     1 g 100 mL/hr over 30 Minutes Intravenous Daily after supper 10/13/14 1816     10/12/14 2000  piperacillin-tazobactam (ZOSYN) IVPB 3.375 g  Status:  Discontinued     3.375 g 12.5 mL/hr over 240 Minutes Intravenous Every 8 hours 10/12/14 1923 10/13/14 1816   10/12/14 0500  vancomycin (VANCOCIN) 1,500 mg in sodium chloride 0.9 % 500 mL IVPB     1,500 mg 250 mL/hr over 120 Minutes Intravenous  Once 10/12/14 0454 10/12/14 0725   10/12/14 0500  piperacillin-tazobactam (ZOSYN) IVPB 4.5 g  Status:  Discontinued     4.5 g 200 mL/hr over 30 Minutes Intravenous  Once 10/12/14 0454 10/12/14 0457   10/12/14 0500  piperacillin-tazobactam (ZOSYN) IVPB 3.375 g     3.375 g 100 mL/hr over 30 Minutes Intravenous  Once 10/12/14 0458 10/12/14 0550       Assessment/Plan Left retroperitoneal liposarcoma s/p resection and left nephrectomy -- 07/13/14 s/p XRT Post op intra-abdominal abscess -- drain removed 6/28 HD#5 Small bowel obstruction -Presumably from compression from fluid collections versus adhesions. Will continue with conservative management. The patient understands he may need surgical intervention if he fails medical management.  -Films better today, clamping trials, d/c NG later today -Thorazine for hiccups  -Zofran and reglan Multiple intra-abdominal abscesses-s/p drain 7/14. Follow cultures (gram positive cocci in pairs/clusters). Now on Invanz Day #2.  Will need long term antibiotics for multiple abscesses not amendable to drainage. ID following. -Dr. Barry Dienes recommended checking Amylase and Triglycerides. Trigs are 84, amylase in process Anemia of chronic disease-s/p 2 units of PRBCs 7/14. Hgb improved 9.8.  Iron and TIBC low,  FOBT positive. May need to ask hem/onc for input re iron replacement, or can at least start oral Iron once taking PO's.   Renal insufficiency- sCr normalized, adequate UOP. Continue IVF. Avoid nephrotoxins.  Hemorrhoids - start anusol FEN-K normalized. IVF, NPO.  VTE prophylaxis-SCD, heparin SQ Dispo-continue inpatient, home when we can advance his diet to solid food.     LOS: 4 days    Darryl Griffith 10/16/2014, 7:38 AM Pager: [phone]

## 2014-10-16 NOTE — Progress Notes (Signed)
INFECTIOUS DISEASE PROGRESS NOTE  ID: Darryl Griffith is a 69 y.o. male with  Active Problems:   Abdominal abscess   Small bowel obstruction   Intra-abdominal fluid collection  Subjective: Without complaints  Abtx:  Anti-infectives    Start     Dose/Rate Route Frequency Ordered Stop   10/13/14 1845  ertapenem (INVANZ) 1 g in sodium chloride 0.9 % 50 mL IVPB     1 g 100 mL/hr over 30 Minutes Intravenous Daily after supper 10/13/14 1816     10/12/14 2000  piperacillin-tazobactam (ZOSYN) IVPB 3.375 g  Status:  Discontinued     3.375 g 12.5 mL/hr over 240 Minutes Intravenous Every 8 hours 10/12/14 1923 10/13/14 1816   10/12/14 0500  vancomycin (VANCOCIN) 1,500 mg in sodium chloride 0.9 % 500 mL IVPB     1,500 mg 250 mL/hr over 120 Minutes Intravenous  Once 10/12/14 0454 10/12/14 0725   10/12/14 0500  piperacillin-tazobactam (ZOSYN) IVPB 4.5 g  Status:  Discontinued     4.5 g 200 mL/hr over 30 Minutes Intravenous  Once 10/12/14 0454 10/12/14 0457   10/12/14 0500  piperacillin-tazobactam (ZOSYN) IVPB 3.375 g     3.375 g 100 mL/hr over 30 Minutes Intravenous  Once 10/12/14 0458 10/12/14 0550      Medications:  Scheduled: . ertapenem  1 g Intravenous QPC supper  . heparin subcutaneous  5,000 Units Subcutaneous 3 times per day  . hydrocortisone   Rectal QID  . metoCLOPramide (REGLAN) injection  10 mg Intravenous 3 times per day  . pantoprazole (PROTONIX) IV  40 mg Intravenous QHS  . traZODone  50 mg Oral QHS    Objective: Vital signs in last 24 hours: Temp:  [98.2 F (36.8 C)-98.8 F (37.1 C)] 98.2 F (36.8 C) (07/18 1426) Pulse Rate:  [90-98] 93 (07/18 1426) Resp:  [18-20] 18 (07/18 1426) BP: (122-136)/(62-80) 122/62 mmHg (07/18 1426) SpO2:  [98 %-99 %] 99 % (07/18 1426)   General appearance: alert, cooperative, fatigued, no distress and pale Resp: clear to auscultation bilaterally Cardio: regular rate and rhythm GI: normal findings: bowel sounds normal and soft,  non-tender  Lab Results  Recent Labs  10/16/14 0612  WBC 7.2  HGB 9.8*  HCT 30.7*  NA 140  K 4.1  CL 110  CO2 25  BUN 10  CREATININE 1.17   Liver Panel No results for input(s): PROT, ALBUMIN, AST, ALT, ALKPHOS, BILITOT, BILIDIR, IBILI in the last 72 hours. Sedimentation Rate No results for input(s): ESRSEDRATE in the last 72 hours. C-Reactive Protein No results for input(s): CRP in the last 72 hours.  Microbiology: Recent Results (from the past 240 hour(s))  Culture, blood (routine x 2)     Status: None (Preliminary result)   Collection Time: 10/12/14  5:15 AM  Result Value Ref Range Status   Specimen Description BLOOD RIGHT FOREARM  Final   Special Requests BOTTLES DRAWN AEROBIC AND ANAEROBIC 5ML  Final   Culture   Final    NO GROWTH 4 DAYS Performed at [facility]    Report Status PENDING  Incomplete  Culture, blood (routine x 2)     Status: None (Preliminary result)   Collection Time: 10/12/14  5:25 AM  Result Value Ref Range Status   Specimen Description BLOOD RIGHT ANTECUBITAL  Final   Special Requests BOTTLES DRAWN AEROBIC AND ANAEROBIC 5ML  Final   Culture   Final    NO GROWTH 4 DAYS Performed at [facility]  Report Status PENDING  Incomplete  Culture, routine-abscess     Status: None   Collection Time: 10/12/14  3:21 PM  Result Value Ref Range Status   Specimen Description Retroperiton  Final   Special Requests Normal  Final   Gram Stain   Final    ABUNDANT WBC PRESENT, PREDOMINANTLY PMN NO SQUAMOUS EPITHELIAL CELLS SEEN ABUNDANT GRAM POSITIVE COCCI IN PAIRS IN CLUSTERS Performed at [facility]    Culture   Final    NO GROWTH 3 DAYS Performed at [facility]    Report Status 2014/10/31 FINAL  Final  Anaerobic culture     Status: None (Preliminary result)   Collection Time: 10/12/14  3:21 PM  Result Value Ref Range Status   Specimen Description ABSCESS  Final   Special Requests Normal  Final   Gram Stain    Final    ABUNDANT WBC PRESENT, PREDOMINANTLY PMN NO SQUAMOUS EPITHELIAL CELLS SEEN ABUNDANT GRAM POSITIVE COCCI IN PAIRS IN CLUSTERS Performed at [facility]    Culture   Final    NO ANAEROBES ISOLATED; CULTURE IN PROGRESS FOR 5 DAYS Performed at [facility]    Report Status PENDING  Incomplete  Fungus culture w smear     Status: None (Preliminary result)   Collection Time: 10/12/14  3:21 PM  Result Value Ref Range Status   Specimen Description ABSCESS  Final   Special Requests Normal  Final   Fungal Smear   Final    NO YEAST OR FUNGAL ELEMENTS SEEN Performed at [facility]    Culture   Final    CULTURE IN PROGRESS FOR FOUR WEEKS Performed at [facility]    Report Status PENDING  Incomplete    Studies/Results: Dg Abd 2 Views  10/31/2014   CLINICAL DATA:  Persistent abdominal pain and distention  EXAM: ABDOMEN - 2 VIEW  COMPARISON:  October 15, 2014  FINDINGS: Supine and upright images obtained. Compared to 1 day prior, there is no longer appreciable small bowel dilatation. Multiple air-fluid levels, however, do remain. No free air. Nasogastric tube tip and side port in stomach. Drain in the left abdomen remains. Multiple surgical clips are noted on the left, stable.  IMPRESSION: Resolution of bowel dilatation. Multiple air-fluid levels remain. Nasogastric tube tip and side port in stomach. Suspect partial but incomplete resolution of bowel obstruction. No free air.   Electronically Signed   By: Lowella Grip III M.D.   On: 2014/10/31 07:29   Dg Abd 2 Views  10/15/2014   CLINICAL DATA:  Nasogastric tube in place there follow-up small bowel obstruction. Patient reports back pain when moving. History of left kidney drain.  EXAM: ABDOMEN - 2 VIEW  COMPARISON:  10/14/2014 and earlier  FINDINGS: Nasogastric tube is in place with tip overlying the region of the stomach. There is dense opacity in the left lower lobe of the lung consistent with  atelectasis, consolidation and pleural effusion.  Surgical clips and drain are identified in the left upper quadrant as before.  There is moderate dilatation of small bowel loops which contain air-fluid levels on the erect view. The degree of dilatation is similar compared prior studies. However fewer bowel loops appear dilated compared with prior studies. There has been interval evacuation of some of the oral contrast from the colon since prior study.  IMPRESSION: 1. Persistent small bowel dilatation. 2. Interval evacuation of some of the contrast from the colon. 3. Left lower lobe atelectasis/consolidation and  effusion.   Electronically Signed   By: Nolon Nations M.D.   On: 10/15/2014 09:10     Assessment/Plan: L Retroperitoneal Abscess Liposarcoma SBO  Total days of antibiotics: 4 (zosyn --> invanz)  Await cx of his fluid No change in anbx for now.  Has PIC         [name] Infectious Diseases (pager) [phone] [email or web address] 10/16/2014, 3:56 PM  LOS: 4 days

## 2014-10-17 ENCOUNTER — Inpatient Hospital Stay (HOSPITAL_COMMUNITY): Payer: Medicare Other

## 2014-10-17 DIAGNOSIS — K56600 Partial intestinal obstruction, unspecified as to cause: Secondary | ICD-10-CM | POA: Insufficient documentation

## 2014-10-17 LAB — CBC
HCT: 28.5 % — ABNORMAL LOW (ref 39.0–52.0)
Hemoglobin: 9.2 g/dL — ABNORMAL LOW (ref 13.0–17.0)
MCH: 28.3 pg (ref 26.0–34.0)
MCHC: 32.3 g/dL (ref 30.0–36.0)
MCV: 87.7 fL (ref 78.0–100.0)
Platelets: 579 10*3/uL — ABNORMAL HIGH (ref 150–400)
RBC: 3.25 MIL/uL — ABNORMAL LOW (ref 4.22–5.81)
RDW: 16.4 % — AB (ref 11.5–15.5)
WBC: 6.9 10*3/uL (ref 4.0–10.5)

## 2014-10-17 LAB — CULTURE, BLOOD (ROUTINE X 2)
Culture: NO GROWTH
Culture: NO GROWTH

## 2014-10-17 LAB — ANAEROBIC CULTURE: SPECIAL REQUESTS: NORMAL

## 2014-10-17 MED ORDER — FERROUS SULFATE 325 (65 FE) MG PO TABS
325.0000 mg | ORAL_TABLET | Freq: Two times a day (BID) | ORAL | Status: DC
  Administered 2014-10-17 – 2014-10-20 (×3): 325 mg via ORAL
  Filled 2014-10-17 (×7): qty 1

## 2014-10-17 MED ORDER — HYDROCODONE-ACETAMINOPHEN 5-325 MG PO TABS: 1.0000 | ORAL_TABLET | ORAL | Status: DC | PRN

## 2014-10-17 NOTE — Progress Notes (Signed)
Referring Physician(s): CCS  Subjective: Patient denies significant left flank pain, admits to tenderness at drain site. Tolerating liquid diet without N/V  Allergies: Compazine  Medications: Prior to Admission medications   Medication Sig Start Date End Date Taking? Authorizing Provider  LORazepam (ATIVAN) 1 MG tablet Take 1 tablet by mouth 3 (three) times daily as needed for anxiety. Anxiety/nausea 09/01/14  Yes Historical Provider, MD  Melatonin 5 MG CAPS Take 1 capsule by mouth at bedtime.  07/27/14  Yes Historical Provider, MD  metoCLOPramide (REGLAN) 10 MG/10ML SOLN Take 10 mLs (10 mg total) by mouth 4 (four) times daily -  before meals and at bedtime. 08/21/14  Yes Tyler Pita, MD  Multiple Vitamin (MULTIVITAMIN WITH MINERALS) TABS tablet Take 1 tablet by mouth every morning.    Yes Historical Provider, MD  pantoprazole (PROTONIX) 40 MG tablet Take 1 tablet (40 mg total) by mouth daily. 09/22/14  Yes Donne Hazel, MD  traZODone (DESYREL) 50 MG tablet Take 50 mg by mouth at bedtime.    Yes Historical Provider, MD  ondansetron (ZOFRAN ODT) 8 MG disintegrating tablet Take 1 tablet (8 mg total) by mouth every 8 (eight) hours as needed for nausea or vomiting. 10/11/14   Jola Schmidt, MD  oxyCODONE-acetaminophen (PERCOCET/ROXICET) 5-325 MG per tablet Take 1-2 tablets by mouth every 6 (six) hours as needed for severe pain. 08/04/14   Stark Klein, MD    Vital Signs: BP 126/71 mmHg  Pulse 91  Temp(Src) 98.7 F (37.1 C) (Oral)  Resp 24  Ht 5\' 8"  (1.727 m)  Wt 174 lb 1.6 oz (78.971 kg)  BMI 26.48 kg/m2  SpO2 96%  Physical Exam General: A&ox3, NAD Abd: Soft, NT, ND, Left flank perc drain intact serous output 10cc in bulb  Imaging: Dg Abd 2 Views  10/16/2014   CLINICAL DATA:  Persistent abdominal pain and distention  EXAM: ABDOMEN - 2 VIEW  COMPARISON:  October 15, 2014  FINDINGS: Supine and upright images obtained. Compared to 1 day prior, there is no longer appreciable small bowel  dilatation. Multiple air-fluid levels, however, do remain. No free air. Nasogastric tube tip and side port in stomach. Drain in the left abdomen remains. Multiple surgical clips are noted on the left, stable.  IMPRESSION: Resolution of bowel dilatation. Multiple air-fluid levels remain. Nasogastric tube tip and side port in stomach. Suspect partial but incomplete resolution of bowel obstruction. No free air.   Electronically Signed   By: Lowella Grip III M.D.   On: 10/16/2014 07:29   Dg Abd 2 Views  10/15/2014   CLINICAL DATA:  Nasogastric tube in place there follow-up small bowel obstruction. Patient reports back pain when moving. History of left kidney drain.  EXAM: ABDOMEN - 2 VIEW  COMPARISON:  10/14/2014 and earlier  FINDINGS: Nasogastric tube is in place with tip overlying the region of the stomach. There is dense opacity in the left lower lobe of the lung consistent with atelectasis, consolidation and pleural effusion.  Surgical clips and drain are identified in the left upper quadrant as before.  There is moderate dilatation of small bowel loops which contain air-fluid levels on the erect view. The degree of dilatation is similar compared prior studies. However fewer bowel loops appear dilated compared with prior studies. There has been interval evacuation of some of the oral contrast from the colon since prior study.  IMPRESSION: 1. Persistent small bowel dilatation. 2. Interval evacuation of some of the contrast from the colon. 3. Left lower  lobe atelectasis/consolidation and effusion.   Electronically Signed   By: Nolon Nations M.D.   On: 10/15/2014 09:10   Dg Abd Portable 1v  10/14/2014   CLINICAL DATA:  Small-bowel obstruction. History of left retroperitoneal mass, retroperitoneal fluid collection, resection of retroperitoneal mass.  EXAM: PORTABLE ABDOMEN - 1 VIEW  COMPARISON:  [id number] I  FINDINGS: There is residual contrast in the colon following recent CT exam. A left-sided percutaneous  drain remains in place in the regional numerous surgical clips. There is dilatation of small bowel loops in the central abdomen. There has been slight interval improvement in dilatation since previous exam.  IMPRESSION: 1. Persistent small bowel obstruction showing slight interval decrease in distension of small bowel loops. 2. Persistent left upper quadrant drain. 3. Residual contrast in the colon.   Electronically Signed   By: Nolon Nations M.D.   On: 10/14/2014 07:26    Labs:  CBC:  Recent Labs  10/12/14 0845 10/13/14 0510 10/16/14 0612 10/17/14 0820  WBC 6.4 6.2 7.2 6.9  HGB 6.8* 9.4* 9.8* 9.2*  HCT 20.9* 27.9* 30.7* 28.5*  PLT 578* 599* 634* 579*    COAGS:  Recent Labs  07/13/14 1939  07/14/14 1055 07/14/14 1830 07/15/14 0058 07/15/14 0515 07/16/14 0700 09/18/14 1400  INR 1.42  --  1.20  --   --   --  1.21 1.06  APTT  --   < > 33 36 31 33  --  30  < > = values in this interval not displayed.  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

## 2014-10-17 NOTE — Progress Notes (Signed)
Spiritual care continuing to follow for support.  Familiar with pt from previous admissions.  Pt's son, Antony Haste, has Asberger's and brittle type one diabetes.  This chaplain familiar with Antony Haste through connections with community group providing support for those with Asberger's.   Family was not present at time of chaplain consult.  Will follow up tomorrow and attempt to make contact with Mr. Dumond and spouse.    [place], [place]

## 2014-10-17 NOTE — Progress Notes (Signed)
[facility] Progress Note     Subjective: Pt doing well since NG discontinued.  No N/V, tolerating clears.  Having lots of loose BM's.  No real abdominal pain or distension.  Wants something better to eat.  Objective: Vital signs in last 24 hours: Temp:  [98.2 F (36.8 C)-99.3 F (37.4 C)] 98.7 F (37.1 C) (07/19 0536) Pulse Rate:  [91-93] 91 (07/19 0536) Resp:  [18-24] 24 (07/19 0536) BP: (122-137)/(62-76) 126/71 mmHg (07/19 0536) SpO2:  [96 %-99 %] 96 % (07/19 0536) Last BM Date: 10/17/14  Intake/Output from previous day: 11-06-2022 0701 - 07/19 0700 In: 130 [P.O.:120] Out: 878 [Urine:850; Drains:28] Intake/Output this shift: Total I/O In: 320 [P.O.:320] Out: 300 [Urine:300]  PE: Gen:  Alert, NAD, pleasant Abd: Soft, NT/ND, +BS, no HSM, drain with minimal serous drainage, scars well healed   Lab Results:   Recent Labs  2014/11/06 0612 10/17/14 0820  WBC 7.2 6.9  HGB 9.8* 9.2*  HCT 30.7* 28.5*  PLT 634* 579*   BMET  Recent Labs  Nov 06, 2014 0612  NA 140  K 4.1  CL 110  CO2 25  GLUCOSE 108*  BUN 10  CREATININE 1.17  CALCIUM 8.2*   PT/INR No results for input(s): LABPROT, INR in the last 72 hours. CMP     Component Value Date/Time   NA 140 2014/11/06 0612   NA 131* 09/18/2014 0959   K 4.1 11-06-14 0612   K 4.4 09/18/2014 0959   CL 110 11-06-14 0612   CO2 25 November 06, 2014 0612   CO2 26 09/18/2014 0959   GLUCOSE 108* Nov 06, 2014 0612   GLUCOSE 159* 09/18/2014 0959   BUN 10 11/06/2014 0612   BUN 38.6* 09/18/2014 0959   CREATININE 1.17 11-06-14 0612   CREATININE 1.0 09/18/2014 0959   CALCIUM 8.2* 06-Nov-2014 0612   CALCIUM 8.8 09/18/2014 0959   PROT 6.6 10/11/2014 1705   PROT 5.3* 09/18/2014 0959   ALBUMIN 2.4* 10/11/2014 1705   ALBUMIN 2.4* 09/18/2014 0959   AST 24 10/11/2014 1705   AST 17 09/18/2014 0959   ALT 22 10/11/2014 1705   ALT 12 09/18/2014 0959   ALKPHOS 101 10/11/2014 1705   ALKPHOS 65 09/18/2014 0959   BILITOT 0.5  10/11/2014 1705   BILITOT <0.20 09/18/2014 0959   GFRNONAA >60 11-06-2014 0612   GFRAA >60 11-06-2014 0612   Lipase     Component Value Date/Time   LIPASE 17* 10/11/2014 1705       Studies/Results: Dg Abd 2 Views  11/06/14   CLINICAL DATA:  Persistent abdominal pain and distention  EXAM: ABDOMEN - 2 VIEW  COMPARISON:  October 15, 2014  FINDINGS: Supine and upright images obtained. Compared to 1 day prior, there is no longer appreciable small bowel dilatation. Multiple air-fluid levels, however, do remain. No free air. Nasogastric tube tip and side port in stomach. Drain in the left abdomen remains. Multiple surgical clips are noted on the left, stable.  IMPRESSION: Resolution of bowel dilatation. Multiple air-fluid levels remain. Nasogastric tube tip and side port in stomach. Suspect partial but incomplete resolution of bowel obstruction. No free air.   Electronically Signed   By: Lowella Grip III M.D.   On: 11-06-2014 07:29    Anti-infectives: Anti-infectives    Start     Dose/Rate Route Frequency Ordered Stop   10/13/14 1845  ertapenem (INVANZ) 1 g in sodium chloride 0.9 % 50 mL IVPB     1 g 100 mL/hr over 30 Minutes Intravenous Daily  after supper 10/13/14 1816     10/12/14 2000  piperacillin-tazobactam (ZOSYN) IVPB 3.375 g  Status:  Discontinued     3.375 g 12.5 mL/hr over 240 Minutes Intravenous Every 8 hours 10/12/14 1923 10/13/14 1816   10/12/14 0500  vancomycin (VANCOCIN) 1,500 mg in sodium chloride 0.9 % 500 mL IVPB     1,500 mg 250 mL/hr over 120 Minutes Intravenous  Once 10/12/14 0454 10/12/14 0725   10/12/14 0500  piperacillin-tazobactam (ZOSYN) IVPB 4.5 g  Status:  Discontinued     4.5 g 200 mL/hr over 30 Minutes Intravenous  Once 10/12/14 0454 10/12/14 0457   10/12/14 0500  piperacillin-tazobactam (ZOSYN) IVPB 3.375 g     3.375 g 100 mL/hr over 30 Minutes Intravenous  Once 10/12/14 0458 10/12/14 0550       Assessment/Plan Left retroperitoneal liposarcoma s/p  resection and left nephrectomy -- 07/13/14 s/p XRT Post op intra-abdominal abscess -- drain removed 6/28 HD#6 Small bowel obstruction -Presumably from compression from fluid collections versus adhesions.  -NG discontinued yesterday, tolerating clears, advance to fulls -Ileus resolving - having diarrhea -Thorazine for hiccups  -Zofran and reglan Multiple intra-abdominal abscesses-s/p drain 7/14. Anaerobic/fungal cx still pending (gram positive cocci in pairs/clusters on gram stain). Now on Invanz Day #3/???. Will need long term antibiotics for multiple abscesses not amendable to drainage. ID following, length of treatment per ID -Dr. Barry Dienes recommended checking Amylase and Triglycerides. Trigs are 84, amylase 7. -Drain clear with low output, consider discontinuing on Thursday if CT improved Anemia of chronic disease-s/p 2 units of PRBCs 7/14. Hgb down to 9.2. Iron and TIBC low, FOBT positive. Start iron 325mg  BID Renal insufficiency- sCr normalized, adequate UOP. Continue IVF. Avoid nephrotoxins.  Hemorrhoids - start anusol FEN-K normalized. IVF, NPO.  VTE prophylaxis-SCD, heparin SQ Dispo-continue inpatient, home when we can advance his diet to solid food. Repeat CT scan ordered for Thursday.    LOS: 5 days    Nat Christen 10/17/2014, 11:07 AM Pager: [phone]

## 2014-10-17 NOTE — Progress Notes (Signed)
INFECTIOUS DISEASE PROGRESS NOTE  ID: Darryl Griffith is a 69 y.o. male with  Active Problems:   Abdominal abscess   Small bowel obstruction   Intra-abdominal fluid collection  Subjective: Sitting on bedside comode Without complaints.  No pain with BM.   Abtx:  Anti-infectives    Start     Dose/Rate Route Frequency Ordered Stop   10/13/14 1845  ertapenem (INVANZ) 1 g in sodium chloride 0.9 % 50 mL IVPB     1 g 100 mL/hr over 30 Minutes Intravenous Daily after supper 10/13/14 1816     10/12/14 2000  piperacillin-tazobactam (ZOSYN) IVPB 3.375 g  Status:  Discontinued     3.375 g 12.5 mL/hr over 240 Minutes Intravenous Every 8 hours 10/12/14 1923 10/13/14 1816   10/12/14 0500  vancomycin (VANCOCIN) 1,500 mg in sodium chloride 0.9 % 500 mL IVPB     1,500 mg 250 mL/hr over 120 Minutes Intravenous  Once 10/12/14 0454 10/12/14 0725   10/12/14 0500  piperacillin-tazobactam (ZOSYN) IVPB 4.5 g  Status:  Discontinued     4.5 g 200 mL/hr over 30 Minutes Intravenous  Once 10/12/14 0454 10/12/14 0457   10/12/14 0500  piperacillin-tazobactam (ZOSYN) IVPB 3.375 g     3.375 g 100 mL/hr over 30 Minutes Intravenous  Once 10/12/14 0458 10/12/14 0550      Medications:  Scheduled: . ertapenem  1 g Intravenous QPC supper  . ferrous sulfate  325 mg Oral BID WC  . heparin subcutaneous  5,000 Units Subcutaneous 3 times per day  . hydrocortisone   Rectal QID  . metoCLOPramide (REGLAN) injection  10 mg Intravenous 3 times per day  . pantoprazole (PROTONIX) IV  40 mg Intravenous QHS  . traZODone  50 mg Oral QHS    Objective: Vital signs in last 24 hours: Temp:  [98.7 F (37.1 C)-99.3 F (37.4 C)] 98.7 F (37.1 C) (07/19 0536) Pulse Rate:  [91-92] 91 (07/19 0536) Resp:  [20-24] 24 (07/19 0536) BP: (126-137)/(71-76) 126/71 mmHg (07/19 0536) SpO2:  [96 %-99 %] 96 % (07/19 0536)   General appearance: alert, cooperative and no distress GI: normal findings: bowel sounds normal and soft,  non-tender  Lab Results  Recent Labs  10/16/14 0612 10/17/14 0820  WBC 7.2 6.9  HGB 9.8* 9.2*  HCT 30.7* 28.5*  NA 140  --   K 4.1  --   CL 110  --   CO2 25  --   BUN 10  --   CREATININE 1.17  --    Liver Panel No results for input(s): PROT, ALBUMIN, AST, ALT, ALKPHOS, BILITOT, BILIDIR, IBILI in the last 72 hours. Sedimentation Rate No results for input(s): ESRSEDRATE in the last 72 hours. C-Reactive Protein No results for input(s): CRP in the last 72 hours.  Microbiology: Recent Results (from the past 240 hour(s))  Culture, blood (routine x 2)     Status: None (Preliminary result)   Collection Time: 10/12/14  5:15 AM  Result Value Ref Range Status   Specimen Description BLOOD RIGHT FOREARM  Final   Special Requests BOTTLES DRAWN AEROBIC AND ANAEROBIC 5ML  Final   Culture   Final    NO GROWTH 4 DAYS Performed at [facility]    Report Status PENDING  Incomplete  Culture, blood (routine x 2)     Status: None (Preliminary result)   Collection Time: 10/12/14  5:25 AM  Result Value Ref Range Status   Specimen Description BLOOD RIGHT ANTECUBITAL  Final  Special Requests BOTTLES DRAWN AEROBIC AND ANAEROBIC 5ML  Final   Culture   Final    NO GROWTH 4 DAYS Performed at [facility]    Report Status PENDING  Incomplete  Culture, routine-abscess     Status: None   Collection Time: 10/12/14  3:21 PM  Result Value Ref Range Status   Specimen Description Retroperiton  Final   Special Requests Normal  Final   Gram Stain   Final    ABUNDANT WBC PRESENT, PREDOMINANTLY PMN NO SQUAMOUS EPITHELIAL CELLS SEEN ABUNDANT GRAM POSITIVE COCCI IN PAIRS IN CLUSTERS Performed at [facility]    Culture   Final    NO GROWTH 3 DAYS Performed at [facility]    Report Status 11/05/2014 FINAL  Final  Anaerobic culture     Status: None   Collection Time: 10/12/14  3:21 PM  Result Value Ref Range Status   Specimen Description ABSCESS  Final   Special  Requests Normal  Final   Gram Stain   Final    ABUNDANT WBC PRESENT, PREDOMINANTLY PMN NO SQUAMOUS EPITHELIAL CELLS SEEN ABUNDANT GRAM POSITIVE COCCI IN PAIRS IN CLUSTERS Performed at [facility]    Culture   Final    NO ANAEROBES ISOLATED Performed at [facility]    Report Status 10/17/2014 FINAL  Final  Fungus culture w smear     Status: None (Preliminary result)   Collection Time: 10/12/14  3:21 PM  Result Value Ref Range Status   Specimen Description ABSCESS  Final   Special Requests Normal  Final   Fungal Smear   Final    NO YEAST OR FUNGAL ELEMENTS SEEN Performed at [facility]    Culture   Final    CULTURE IN PROGRESS FOR FOUR WEEKS Performed at [facility]    Report Status PENDING  Incomplete    Studies/Results: Dg Abd 2 Views  11-05-2014   CLINICAL DATA:  Persistent abdominal pain and distention  EXAM: ABDOMEN - 2 VIEW  COMPARISON:  October 15, 2014  FINDINGS: Supine and upright images obtained. Compared to 1 day prior, there is no longer appreciable small bowel dilatation. Multiple air-fluid levels, however, do remain. No free air. Nasogastric tube tip and side port in stomach. Drain in the left abdomen remains. Multiple surgical clips are noted on the left, stable.  IMPRESSION: Resolution of bowel dilatation. Multiple air-fluid levels remain. Nasogastric tube tip and side port in stomach. Suspect partial but incomplete resolution of bowel obstruction. No free air.   Electronically Signed   By: Lowella Grip III M.D.   On: 11-05-14 07:29     Assessment/Plan: L Retroperitoneal Abscess Liposarcoma Partial SBO  Total days of antibiotics: 5 (zosyn --> invanz)         Appears to be doing well For repeat CT 7-21 Continue Invanz, plan depending on CT results now and at f/u.  Cx unrevealing (gram stain suggests strep, enterococcus, or staph).   Darryl Griffith Infectious Diseases (pager)  [phone] [email or web address] 10/17/2014, 3:17 PM  LOS: 5 days

## 2014-10-18 DIAGNOSIS — E46 Unspecified protein-calorie malnutrition: Secondary | ICD-10-CM | POA: Insufficient documentation

## 2014-10-18 LAB — CBC
HCT: 26.7 % — ABNORMAL LOW (ref 39.0–52.0)
Hemoglobin: 8.7 g/dL — ABNORMAL LOW (ref 13.0–17.0)
MCH: 28.4 pg (ref 26.0–34.0)
MCHC: 32.6 g/dL (ref 30.0–36.0)
MCV: 87.3 fL (ref 78.0–100.0)
Platelets: 491 10*3/uL — ABNORMAL HIGH (ref 150–400)
RBC: 3.06 MIL/uL — ABNORMAL LOW (ref 4.22–5.81)
RDW: 16.5 % — AB (ref 11.5–15.5)
WBC: 6.9 10*3/uL (ref 4.0–10.5)

## 2014-10-18 LAB — OCCULT BLOOD X 1 CARD TO LAB, STOOL
FECAL OCCULT BLD: NEGATIVE
FECAL OCCULT BLD: NEGATIVE

## 2014-10-18 MED ORDER — IBUPROFEN 200 MG PO TABS: 400.0000 mg | ORAL_TABLET | ORAL | Status: DC | PRN

## 2014-10-18 MED ORDER — BOOST / RESOURCE BREEZE PO LIQD: 1.0000 | Freq: Two times a day (BID) | ORAL | Status: DC | PRN

## 2014-10-18 NOTE — Progress Notes (Signed)
INFECTIOUS DISEASE PROGRESS NOTE  ID: Darryl Griffith is a 69 y.o. male with  Active Problems:   Abdominal abscess   Small bowel obstruction   Intra-abdominal fluid collection   Partial small bowel obstruction  Subjective: Without complaints  Abtx:  Anti-infectives    Start     Dose/Rate Route Frequency Ordered Stop   10/13/14 1845  ertapenem (INVANZ) 1 g in sodium chloride 0.9 % 50 mL IVPB     1 g 100 mL/hr over 30 Minutes Intravenous Daily after supper 10/13/14 1816     10/12/14 2000  piperacillin-tazobactam (ZOSYN) IVPB 3.375 g  Status:  Discontinued     3.375 g 12.5 mL/hr over 240 Minutes Intravenous Every 8 hours 10/12/14 1923 10/13/14 1816   10/12/14 0500  vancomycin (VANCOCIN) 1,500 mg in sodium chloride 0.9 % 500 mL IVPB     1,500 mg 250 mL/hr over 120 Minutes Intravenous  Once 10/12/14 0454 10/12/14 0725   10/12/14 0500  piperacillin-tazobactam (ZOSYN) IVPB 4.5 g  Status:  Discontinued     4.5 g 200 mL/hr over 30 Minutes Intravenous  Once 10/12/14 0454 10/12/14 0457   10/12/14 0500  piperacillin-tazobactam (ZOSYN) IVPB 3.375 g     3.375 g 100 mL/hr over 30 Minutes Intravenous  Once 10/12/14 0458 10/12/14 0550      Medications:  Scheduled: . ertapenem  1 g Intravenous QPC supper  . ferrous sulfate  325 mg Oral BID WC  . heparin subcutaneous  5,000 Units Subcutaneous 3 times per day  . hydrocortisone   Rectal QID  . metoCLOPramide (REGLAN) injection  10 mg Intravenous 3 times per day  . pantoprazole (PROTONIX) IV  40 mg Intravenous QHS  . traZODone  50 mg Oral QHS    Objective: Vital signs in last 24 hours: Temp:  [98.2 F (36.8 C)-98.9 F (37.2 C)] 98.2 F (36.8 C) (07/20 1440) Pulse Rate:  [87-93] 92 (07/20 1440) Resp:  [18-20] 18 (07/20 1440) BP: (118-136)/(69-74) 118/69 mmHg (07/20 1440) SpO2:  [96 %-100 %] 100 % (07/20 1440)   General appearance: alert, cooperative, no distress and pale Resp: clear to auscultation bilaterally Cardio: regular  rate and rhythm GI: normal findings: bowel sounds normal and soft, non-tender  Lab Results  Recent Labs  10/16/14 0612 10/17/14 0820 10/18/14 0300  WBC 7.2 6.9 6.9  HGB 9.8* 9.2* 8.7*  HCT 30.7* 28.5* 26.7*  NA 140  --   --   K 4.1  --   --   CL 110  --   --   CO2 25  --   --   BUN 10  --   --   CREATININE 1.17  --   --    Liver Panel No results for input(s): PROT, ALBUMIN, AST, ALT, ALKPHOS, BILITOT, BILIDIR, IBILI in the last 72 hours. Sedimentation Rate No results for input(s): ESRSEDRATE in the last 72 hours. C-Reactive Protein No results for input(s): CRP in the last 72 hours.  Microbiology: Recent Results (from the past 240 hour(s))  Culture, blood (routine x 2)     Status: None   Collection Time: 10/12/14  5:15 AM  Result Value Ref Range Status   Specimen Description BLOOD RIGHT FOREARM  Final   Special Requests BOTTLES DRAWN AEROBIC AND ANAEROBIC 5ML  Final   Culture   Final    NO GROWTH 5 DAYS Performed at [facility]    Report Status 10/17/2014 FINAL  Final  Culture, blood (routine x 2)  Status: None   Collection Time: 10/12/14  5:25 AM  Result Value Ref Range Status   Specimen Description BLOOD RIGHT ANTECUBITAL  Final   Special Requests BOTTLES DRAWN AEROBIC AND ANAEROBIC 5ML  Final   Culture   Final    NO GROWTH 5 DAYS Performed at [facility]    Report Status 10/17/2014 FINAL  Final  Culture, routine-abscess     Status: None   Collection Time: 10/12/14  3:21 PM  Result Value Ref Range Status   Specimen Description Retroperiton  Final   Special Requests Normal  Final   Gram Stain   Final    ABUNDANT WBC PRESENT, PREDOMINANTLY PMN NO SQUAMOUS EPITHELIAL CELLS SEEN ABUNDANT GRAM POSITIVE COCCI IN PAIRS IN CLUSTERS Performed at [facility]    Culture   Final    NO GROWTH 3 DAYS Performed at [facility]    Report Status 10/16/2014 FINAL  Final  Anaerobic culture     Status: None   Collection Time:  10/12/14  3:21 PM  Result Value Ref Range Status   Specimen Description ABSCESS  Final   Special Requests Normal  Final   Gram Stain   Final    ABUNDANT WBC PRESENT, PREDOMINANTLY PMN NO SQUAMOUS EPITHELIAL CELLS SEEN ABUNDANT GRAM POSITIVE COCCI IN PAIRS IN CLUSTERS Performed at [facility]    Culture   Final    NO ANAEROBES ISOLATED Performed at [facility]    Report Status 10/17/2014 FINAL  Final  Fungus culture w smear     Status: None (Preliminary result)   Collection Time: 10/12/14  3:21 PM  Result Value Ref Range Status   Specimen Description ABSCESS  Final   Special Requests Normal  Final   Fungal Smear   Final    NO YEAST OR FUNGAL ELEMENTS SEEN Performed at [facility]    Culture   Final    CULTURE IN PROGRESS FOR FOUR WEEKS Performed at [facility]    Report Status PENDING  Incomplete    Studies/Results: No results found.   Assessment/Plan: L Retroperitoneal Abscess Liposarcoma Partial SBO  Total days of antibiotics: 6 (zosyn --> invanz)  Await repeat CT scan. Minimal drain output noted No change in anbx for now.          [name] Infectious Diseases (pager) [phone] [email or web address] 10/18/2014, 4:30 PM  LOS: 6 days

## 2014-10-18 NOTE — Care Management Note (Signed)
Case Management Note  Patient Details  Name: [name] MRN: [id number] Date of Birth: 1946-03-30  Subjective/Objective:  69 y/o m admitted w/abscess. From home.                  Action/Plan:anticipate home w/HHRN-[facility] following. Await HHC orders, & script for iv abx.   Expected Discharge Date:                  Expected Discharge Plan:  [place]  In-House Referral:     Discharge planning Services  CM Consult  Post Acute Care Choice:    Choice offered to:     DME Arranged:    DME Agency:     HH Arranged:    [place] Agency:     Status of Service:  In process, will continue to follow  Medicare Important Message Given:  Yes-second notification given Date Medicare IM Given:    Medicare IM give by:    Date Additional Medicare IM Given:    Additional Medicare Important Message give by:     If discussed at [place] of Stay Meetings, dates discussed:    Additional Comments:  Dessa Phi, RN 10/18/2014, 4:26 PM

## 2014-10-18 NOTE — Progress Notes (Signed)
Referring Physician(s): CCS  Chief Complaint: Recurrent left RP abscess s/p new perc drain placed 7/14   Subjective: Patient without significant complaints-stools still soft, but not as loose, c/o soreness at drain site. He is tolerating soft diet without N/V. He denies any fever or chills.    Allergies: Compazine  Medications: Prior to Admission medications   Medication Sig Start Date End Date Taking? Authorizing Provider  LORazepam (ATIVAN) 1 MG tablet Take 1 tablet by mouth 3 (three) times daily as needed for anxiety. Anxiety/nausea 09/01/14  Yes Historical Provider, MD  Melatonin 5 MG CAPS Take 1 capsule by mouth at bedtime.  07/27/14  Yes Historical Provider, MD  metoCLOPramide (REGLAN) 10 MG/10ML SOLN Take 10 mLs (10 mg total) by mouth 4 (four) times daily -  before meals and at bedtime. 08/21/14  Yes Tyler Pita, MD  Multiple Vitamin (MULTIVITAMIN WITH MINERALS) TABS tablet Take 1 tablet by mouth every morning.    Yes Historical Provider, MD  pantoprazole (PROTONIX) 40 MG tablet Take 1 tablet (40 mg total) by mouth daily. 09/22/14  Yes Donne Hazel, MD  traZODone (DESYREL) 50 MG tablet Take 50 mg by mouth at bedtime.    Yes Historical Provider, MD  ondansetron (ZOFRAN ODT) 8 MG disintegrating tablet Take 1 tablet (8 mg total) by mouth every 8 (eight) hours as needed for nausea or vomiting. 10/11/14   Jola Schmidt, MD  oxyCODONE-acetaminophen (PERCOCET/ROXICET) 5-325 MG per tablet Take 1-2 tablets by mouth every 6 (six) hours as needed for severe pain. 08/04/14   Stark Klein, MD    Vital Signs: BP 124/70 mmHg  Pulse 93  Temp(Src) 98.6 F (37 C) (Oral)  Resp 18  Ht 5\' 8"  (1.727 m)  Wt 174 lb 1.6 oz (78.971 kg)  BMI 26.48 kg/m2  SpO2 96%  Physical Exam General: A&ox3, NAD Abd: Soft, NT, ND, Left flank perc drain intact serous output 5cc in bulb, 10 cc/24 hrs  Imaging: Dg Abd 2 Views  10/16/2014   CLINICAL DATA:  Persistent abdominal pain and distention  EXAM:  ABDOMEN - 2 VIEW  COMPARISON:  October 15, 2014  FINDINGS: Supine and upright images obtained. Compared to 1 day prior, there is no longer appreciable small bowel dilatation. Multiple air-fluid levels, however, do remain. No free air. Nasogastric tube tip and side port in stomach. Drain in the left abdomen remains. Multiple surgical clips are noted on the left, stable.  IMPRESSION: Resolution of bowel dilatation. Multiple air-fluid levels remain. Nasogastric tube tip and side port in stomach. Suspect partial but incomplete resolution of bowel obstruction. No free air.   Electronically Signed   By: Lowella Grip III M.D.   On: 10/16/2014 07:29   Dg Abd 2 Views  10/15/2014   CLINICAL DATA:  Nasogastric tube in place there follow-up small bowel obstruction. Patient reports back pain when moving. History of left kidney drain.  EXAM: ABDOMEN - 2 VIEW  COMPARISON:  10/14/2014 and earlier  FINDINGS: Nasogastric tube is in place with tip overlying the region of the stomach. There is dense opacity in the left lower lobe of the lung consistent with atelectasis, consolidation and pleural effusion.  Surgical clips and drain are identified in the left upper quadrant as before.  There is moderate dilatation of small bowel loops which contain air-fluid levels on the erect view. The degree of dilatation is similar compared prior studies. However fewer bowel loops appear dilated compared with prior studies. There has been interval evacuation of some  of the oral contrast from the colon since prior study.  IMPRESSION: 1. Persistent small bowel dilatation. 2. Interval evacuation of some of the contrast from the colon. 3. Left lower lobe atelectasis/consolidation and effusion.   Electronically Signed   By: Nolon Nations M.D.   On: 10/15/2014 09:10    Labs:  CBC:  Recent Labs  10/13/14 0510 10/16/14 0612 10/17/14 0820 10/18/14 0300  WBC 6.2 7.2 6.9 6.9  HGB 9.4* 9.8* 9.2* 8.7*  HCT 27.9* 30.7* 28.5* 26.7*  PLT 599*  634* 579* 491*    COAGS:  Recent Labs  07/13/14 1939  07/14/14 1055 07/14/14 1830 07/15/14 0058 07/15/14 0515 07/16/14 0700 09/18/14 1400  INR 1.42  --  1.20  --   --   --  1.21 1.06  APTT  --   < > 33 36 31 33  --  30  < > = values in this interval not displayed.  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

## 2014-10-18 NOTE — Progress Notes (Signed)
Nutrition Follow-up  DOCUMENTATION CODES:   Severe malnutrition in context of chronic illness  INTERVENTION:  - Will order Boost Breeze PRN BID, each supplement provides 250 kcal and 9 grams of protein - RD will continue to monitor for needs  NUTRITION DIAGNOSIS:   Malnutrition related to chronic illness as evidenced by percent weight loss, energy intake < or equal to 75% for > or equal to 1 month, mild depletion of muscle mass. 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 Barry Dienes s/p radiation last being on 7/5. This was complicated by an abscess which was managed with a drain which was removed 09/26/14. The patient states he was in his usual state of health until Tuesday afternoon at which time he developed nausea, vomiting and bloating.  7/20 - NGT removed and pt began passing gas 7/18 - CLD ordered to begin tomorrow at 0500 for CT - Surgery PA note indicates ileus is resolving and pt now having diarrhea - Per chart review, pt ate 85% of breakfast this AM and 0% breakfast 7/18 with no other intakes documented - ~20% completion of lunch tray visualized; pt reports he did not get the soup he ordered and declined RD re-ordering for him - Reports poor appetite and when asked if he would like Boost Breeze ordered, pt reports " I do not know what I want." - Pt with questions about diet when he is d/c'ed. This was discussed with him- encouraged soft foods initially with a focus on protein and that as stool return to normal to include higher fiber items such as raw fruits and vegetables and whole grains and to continue incorporating protein at each meal - Likely not meeting needs at this time. Medications reviewed. Labs reviewed;  Ca: 8.2 mg/dL   7/14 - Pt with NGT to suction - Wife reports pt drinking Boost Breeze at home until 7/12 when he started having N/V - NPO for bowel rest - [place] RD 6/27 - Pt continues to lose weight (16% weight loss x 3 months, significant for time frame).  Diet Order:  DIET SOFT Room service appropriate?: Yes; Fluid consistency:: Thin Diet clear liquid Room service appropriate?: Yes; Fluid consistency:: Thin  Skin:  Reviewed, no issues  Last BM:  7/19  Height:   Ht Readings from Last 1 Encounters:  10/12/14 5\' 8"  (1.727 m)    Weight:   Wt Readings from Last 1 Encounters:  10/12/14 174 lb 1.6 oz (78.971 kg)    Ideal Body Weight:  70 kg  Wt Readings from Last 10 Encounters:  10/12/14 174 lb 1.6 oz (78.971 kg)  09/28/14 174 lb 8 oz (79.153 kg)  09/18/14 178 lb 9.6 oz (81.012 kg)  09/18/14 176 lb 11.2 oz (80.151 kg)  09/15/14 176 lb 4.8 oz (79.969 kg)  09/07/14 177 lb 12.8 oz (80.65 kg)  09/01/14 179 lb 1.6 oz (81.239 kg)  08/25/14 179 lb 14.4 oz (81.602 kg)  08/17/14 183 lb 12.8 oz (83.371 kg)  08/07/14 188 lb 3.2 oz (85.367 kg)    BMI:  Body mass index is 26.48 kg/(m^2).  Estimated Nutritional Needs:   Kcal:  6962-9528  Protein:  110-120g  Fluid:  2.3L/day  EDUCATION NEEDS:  No education needs identified at this time     Jarome Matin, [place], [facility] Inpatient Clinical Dietitian Pager # [phone] After hours/weekend pager # [phone]

## 2014-10-18 NOTE — Progress Notes (Signed)
[facility] Progress Note     Subjective: Pt says yesterday was miserable given he had multiple loose BM's.  No N/V.  Ambulating to the bedside commode.  Using IS some.  Lots of flatus, no abdominal pain or distension.  Nurses deny any noted blood in stools.  Objective: Vital signs in last 24 hours: Temp:  [98.6 F (37 C)-99.1 F (37.3 C)] 98.6 F (37 C) (07/20 0426) Pulse Rate:  [87-102] 93 (07/20 0426) Resp:  [18-22] 18 (07/20 0426) BP: (124-136)/(70-74) 124/70 mmHg (07/20 0426) SpO2:  [96 %-99 %] 96 % (07/20 0426) Last BM Date: 10/17/14  Intake/Output from previous day: 07/19 0701 - 07/20 0700 In: 2070 [P.O.:560; I.V.:1375; IV Piggyback:125] Out: 1260 [Urine:1250; Drains:10] Intake/Output this shift:    PE: Gen:  Alert, NAD, pleasant Abd: Soft, NT/ND, +BS, no HSM, no abdominal scars noted, JP drain with serous drainage 63mL/24hr.     Lab Results:   Recent Labs  10/17/14 0820 10/18/14 0300  WBC 6.9 6.9  HGB 9.2* 8.7*  HCT 28.5* 26.7*  PLT 579* 491*   BMET  Recent Labs  10/16/14 0612  NA 140  K 4.1  CL 110  CO2 25  GLUCOSE 108*  BUN 10  CREATININE 1.17  CALCIUM 8.2*   PT/INR No results for input(s): LABPROT, INR in the last 72 hours. CMP     Component Value Date/Time   NA 140 10/16/2014 0612   NA 131* 09/18/2014 0959   K 4.1 10/16/2014 0612   K 4.4 09/18/2014 0959   CL 110 10/16/2014 0612   CO2 25 10/16/2014 0612   CO2 26 09/18/2014 0959   GLUCOSE 108* 10/16/2014 0612   GLUCOSE 159* 09/18/2014 0959   BUN 10 10/16/2014 0612   BUN 38.6* 09/18/2014 0959   CREATININE 1.17 10/16/2014 0612   CREATININE 1.0 09/18/2014 0959   CALCIUM 8.2* 10/16/2014 0612   CALCIUM 8.8 09/18/2014 0959   PROT 6.6 10/11/2014 1705   PROT 5.3* 09/18/2014 0959   ALBUMIN 2.4* 10/11/2014 1705   ALBUMIN 2.4* 09/18/2014 0959   AST 24 10/11/2014 1705   AST 17 09/18/2014 0959   ALT 22 10/11/2014 1705   ALT 12 09/18/2014 0959   ALKPHOS 101 10/11/2014 1705    ALKPHOS 65 09/18/2014 0959   BILITOT 0.5 10/11/2014 1705   BILITOT <0.20 09/18/2014 0959   GFRNONAA >60 10/16/2014 0612   GFRAA >60 10/16/2014 0612   Lipase     Component Value Date/Time   LIPASE 17* 10/11/2014 1705       Studies/Results: No results found.  Anti-infectives: Anti-infectives    Start     Dose/Rate Route Frequency Ordered Stop   10/13/14 1845  ertapenem (INVANZ) 1 g in sodium chloride 0.9 % 50 mL IVPB     1 g 100 mL/hr over 30 Minutes Intravenous Daily after supper 10/13/14 1816     10/12/14 2000  piperacillin-tazobactam (ZOSYN) IVPB 3.375 g  Status:  Discontinued     3.375 g 12.5 mL/hr over 240 Minutes Intravenous Every 8 hours 10/12/14 1923 10/13/14 1816   10/12/14 0500  vancomycin (VANCOCIN) 1,500 mg in sodium chloride 0.9 % 500 mL IVPB     1,500 mg 250 mL/hr over 120 Minutes Intravenous  Once 10/12/14 0454 10/12/14 0725   10/12/14 0500  piperacillin-tazobactam (ZOSYN) IVPB 4.5 g  Status:  Discontinued     4.5 g 200 mL/hr over 30 Minutes Intravenous  Once 10/12/14 0454 10/12/14 0457   10/12/14 0500  piperacillin-tazobactam (ZOSYN)  IVPB 3.375 g     3.375 g 100 mL/hr over 30 Minutes Intravenous  Once 10/12/14 0458 10/12/14 0550       Assessment/Plan 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  -NG discontinued 7/18, tolerating fulls, advance to fulls (clears at 5am for CT scan) -Ileus resolving - having diarrhea -Thorazine for hiccups  -Zofran and reglan Multiple intra-abdominal abscesses-s/p drain 7/14. Anaerobic/fungal cx still pending (gram positive cocci in pairs/clusters on gram stain). Now on Invanz Day #5/???. Will need long term antibiotics for multiple abscesses not amendable to drainage. ID following, length of treatment per ID -Drain clear  with low output, consider discontinuing on Thursday if CT improved Anemia of chronic disease-s/p 2 units of PRBCs 7/14. Hgb down to 8.7. Iron and TIBC low, FOBT positive. Start iron 325mg  BID Renal insufficiency- sCr normalized, adequate UOP. Continue IVF. Avoid nephrotoxins.  Hemorrhoids - anusol Deconditioning - ordered PT FEN-K normalized. IVF, NPO.  VTE prophylaxis-SCD, heparin SQ Dispo-continue inpatient, home when we can advance his diet to solid food. Repeat CT scan ordered for Thursday.    LOS: 6 days    Nat Christen 10/18/2014, 7:31 AM Pager: [phone]

## 2014-10-18 NOTE — Progress Notes (Signed)
PT Cancellation Note  Patient Details Name: Darryl Griffith MRN: [id number] DOB: 05-26-1945   Cancelled Treatment:    Reason Eval/Treat Not Completed: PT screened, no needs identified, will sign off. RN reports pt walked in hallways with little to no assist and RW. Spoke briefly with pt who denied need for PT services. Will sign off. Please reorder if needs change. Thanks.    Weston Anna, MPT Pager: [phone]

## 2014-10-19 ENCOUNTER — Inpatient Hospital Stay (HOSPITAL_COMMUNITY): Payer: Medicare Other

## 2014-10-19 ENCOUNTER — Encounter (HOSPITAL_COMMUNITY): Payer: Self-pay | Admitting: Radiology

## 2014-10-19 MED ORDER — IOHEXOL 300 MG/ML  SOLN
100.0000 mL | Freq: Once | INTRAMUSCULAR | Status: AC | PRN
  Administered 2014-10-19: 80 mL via INTRAVENOUS

## 2014-10-19 MED ORDER — SACCHAROMYCES BOULARDII 250 MG PO CAPS
250.0000 mg | ORAL_CAPSULE | Freq: Two times a day (BID) | ORAL | Status: DC
  Administered 2014-10-19 – 2014-10-20 (×3): 250 mg via ORAL
  Filled 2014-10-19 (×4): qty 1

## 2014-10-19 NOTE — Care Management Important Message (Signed)
Important Message  Patient Details  Name: Darryl Griffith MRN: [id number] Date of Birth: 09/05/1945   Medicare Important Message Given:  Yes-third notification given    Camillo Flaming 10/19/2014, 11:39 AMImportant Message  Patient Details  Name: Darryl Griffith MRN: [id number] Date of Birth: [birth year]   Medicare Important Message Given:  Yes-third notification given    Camillo Flaming 10/19/2014, 11:38 AM

## 2014-10-19 NOTE — Progress Notes (Signed)
Subjective: Just finished [place] toast and is full, doesn't feel much different than yesterday.  Did well with the CT, his wife asking allot of questions.  Concerned with drop in H/H. It got very low with Radiation treatment.  No increase in the size of his abdomen girth.    Objective: Vital signs in last 24 hours: Temp:  [98 F (36.7 C)-98.6 F (37 C)] 98 F (36.7 C) (07/21 0531) Pulse Rate:  [92-96] 96 (07/21 0531) Resp:  [18-20] 20 (07/21 0531) BP: (118-133)/(69-73) 133/71 mmHg (07/21 0531) SpO2:  [98 %-100 %] 98 % (07/21 0531) Last BM Date: 10/19/14 480 PO  Tolerating soft diet Drain 10 ml BM x 4 yesterday and 1 today recorded so far. Afebrile, VSS No labs CT scan today: 1. Satisfactory drainage of the dominant left retroperitoneal abscess following drain replacement recently. However, multiple regional daughter abscesses remain, including the largest which is 3.7 cm in the lesser sac. 2. Persistent small bowel obstruction, with transition point abutting the dominant draining abscess. Small bowel dilatation perhaps slightly regressed in the last week. 3. Secondary inflammation of the descending colon near the dominant draining abscess. New 9 cm segment of thick walled distal transverse colon where sub serosal involvement by abscess is difficult to exclude (series 2, image 30). 4. Stable layering left pleural effusion and minor lung base atelectasis. Stable small perihepatic and pelvic free fluid.  Intake/Output from previous day: 07/20 0701 - 07/21 0700 In: 1785.8 [P.O.:480; I.V.:1220.8; IV Piggyback:75] Out: 385 [Urine:375; Drains:10] Intake/Output this shift: Total I/O In: 20 [I.V.:10; Other:10] Out: 125 [Urine:125]  General appearance: alert, cooperative and no distress Resp: clear to auscultation bilaterally GI: soft, few Bs, drain is serously a little cloudy.  Lab Results:   Recent Labs  10/17/14 0820 10/18/14 0300  WBC 6.9 6.9  HGB 9.2* 8.7*  HCT  28.5* 26.7*  PLT 579* 491*    BMET No results for input(s): NA, K, CL, CO2, GLUCOSE, BUN, CREATININE, CALCIUM in the last 72 hours. PT/INR No results for input(s): LABPROT, INR in the last 72 hours.  No results for input(s): AST, ALT, ALKPHOS, BILITOT, PROT, ALBUMIN in the last 168 hours.   Lipase     Component Value Date/Time   LIPASE 17* 10/11/2014 1705     Studies/Results: Ct Abdomen Pelvis W Contrast  10/19/2014   CLINICAL DATA:  69 year-old male who underwent a left nephrectomyand excision of retroperitoneal liposarcoma on 09/04/3708 complicated by development of a retroperitonealabscess. Percutaneous drain placed on 08/15/2014 , and drainage catheter removed on 09/26/2014. Recent presentation with nausea, vomiting and bloating and increased left retroperitoneal abscess with multiple smaller abscesses which communicate with the larger one and a small bowel obstruction with transition zone in the left upper abdomen. Status post repeat percutaneous left abdominal abscess drain placement on 10/12/2014.  Subsequent encounter.  EXAM: CT ABDOMEN AND PELVIS WITH CONTRAST  TECHNIQUE: Multidetector CT imaging of the abdomen and pelvis was performed using the standard protocol following bolus administration of intravenous contrast.  CONTRAST:  46mL OMNIPAQUE IOHEXOL 300 MG/ML  SOLN  COMPARISON:  Abdominal radiographs 10/16/2014 and earlier. CT drain placement 10/12/2014. CT Abdomen and Pelvis 10/12/2014, and earlier  FINDINGS: Stable small left pleural effusion. No pericardial effusion. Stable mild bibasilar atelectasis.  Stable visualized osseous structures.  Stable trace pelvic free fluid. Negative rectum. Negative urinary bladder.  Stable an mildly indistinct appearance of the sigmoid colon with underlying chronic diverticulosis.  The left colon now is decompressed. A portion of the  left colon passes in proximity to the left retroperitoneal abscess and drain and remains moderately thickened. The  splenic flexure has a more normal appearance. However, the distal transverse colon in the left upper quadrant now appears abnormally thickened, an the wall appears edematous. The dependent wall appears more hypodense (series 2, image 30), and this is in proximity to 1 of the residual abscess components near the lesser sac. A segment of about 9 cm is affected. See also coronal image 16.  Upstream of that segment of the transverse colon and right colon appear normal. The terminal ileum is decompressed. There are multiple decompressed small bowel loops in the pelvis. There are multiple decompressed small bowel loops in the left lateral abdomen. There are upstream dilated small bowel loops with air and fluid levels, and once again the transition point appears to be the site of the left retroperitoneal abscess on series 2, image 37. Small bowel dilatation has slightly regressed  Since 10/12/2014. The stomach appears stable, including a dominant 3.7 cm lesser sac abscess (series 2, image 26), with evidence of smaller gastrosplenic ligament abscesses (image 22). The dominant abscess in the lesser sac no longer contains gas. The pancreas is abutted by the lesser sac abscess but otherwise enhances normally. No pancreatic ductal dilatation. The liver, gallbladder, right adrenal gland, and right kidney appear stable and within normal limits. Right renal contrast excretion an the right ureter appear within normal limits.  Posterior approach percutaneous retroperitoneal abscess drain remains in place with interval drainage of the dominant abscess (which measured up to 7.2 cm recently). Small volume residual left abscess adjacent to the left psoas muscle (series 2, image 38). Small volume residual gas. The spleen is abutted by these changes and appears stable. The left kidney is surgically absent, with numerous surgical clips also in the region.  The portal venous system appears patent. Aortoiliac calcified atherosclerosis noted.  Major arterial structures in the abdomen and pelvis are patent. Trace abdominal free fluid adjacent to the liver is stable. No pneumoperitoneum.  IMPRESSION: 1. Satisfactory drainage of the dominant left retroperitoneal abscess following drain replacement recently. However, multiple regional daughter abscesses remain, including the largest which is 3.7 cm in the lesser sac. 2. Persistent small bowel obstruction, with transition point abutting the dominant draining abscess. Small bowel dilatation perhaps slightly regressed in the last week. 3. Secondary inflammation of the descending colon near the dominant draining abscess. New 9 cm segment of thick walled distal transverse colon where sub serosal involvement by abscess is difficult to exclude (series 2, image 30). 4. Stable layering left pleural effusion and minor lung base atelectasis. Stable small perihepatic and pelvic free fluid.   Electronically Signed   By: Genevie Ann M.D.   On: 10/19/2014 11:31    Medications: . ertapenem  1 g Intravenous QPC supper  . ferrous sulfate  325 mg Oral BID WC  . heparin subcutaneous  5,000 Units Subcutaneous 3 times per day  . hydrocortisone   Rectal QID  . metoCLOPramide (REGLAN) injection  10 mg Intravenous 3 times per day  . pantoprazole (PROTONIX) IV  40 mg Intravenous QHS  . 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 Anemia of chronic disease-s/p 2 units of PRBCs 7/14 Renal insufficiency- sCr normalized, adequate UOP. Continue IVF. Avoid nephrotoxins.  Hemorrhoids - start  anusol FEN-K normalized. IVF, NPO.  Antibiotics: Day 9; 7th day of Invanz VTE prophylaxis-SCD, heparin SQ   Plan:  I have ask IR to review study and see if there is any way to reach the other abscess  collections.  Continue antibiotics, recheck labs in AM.  Add probiotic.    LOS: 7 days    Kess Mcilwain 10/19/2014

## 2014-10-19 NOTE — Progress Notes (Signed)
INFECTIOUS DISEASE PROGRESS NOTE  ID: Darryl Griffith is a 69 y.o. male with  Active Problems:   Abdominal abscess   Small bowel obstruction   Intra-abdominal fluid collection   Partial small bowel obstruction   Protein-calorie malnutrition  Subjective: Frustrated, waiting for CT scan  Abtx:  Anti-infectives    Start     Dose/Rate Route Frequency Ordered Stop   10/13/14 1845  ertapenem (INVANZ) 1 g in sodium chloride 0.9 % 50 mL IVPB     1 g 100 mL/hr over 30 Minutes Intravenous Daily after supper 10/13/14 1816     10/12/14 2000  piperacillin-tazobactam (ZOSYN) IVPB 3.375 g  Status:  Discontinued     3.375 g 12.5 mL/hr over 240 Minutes Intravenous Every 8 hours 10/12/14 1923 10/13/14 1816   10/12/14 0500  vancomycin (VANCOCIN) 1,500 mg in sodium chloride 0.9 % 500 mL IVPB     1,500 mg 250 mL/hr over 120 Minutes Intravenous  Once 10/12/14 0454 10/12/14 0725   10/12/14 0500  piperacillin-tazobactam (ZOSYN) IVPB 4.5 g  Status:  Discontinued     4.5 g 200 mL/hr over 30 Minutes Intravenous  Once 10/12/14 0454 10/12/14 0457   10/12/14 0500  piperacillin-tazobactam (ZOSYN) IVPB 3.375 g     3.375 g 100 mL/hr over 30 Minutes Intravenous  Once 10/12/14 0458 10/12/14 0550      Medications:  Scheduled: . ertapenem  1 g Intravenous QPC supper  . ferrous sulfate  325 mg Oral BID WC  . heparin subcutaneous  5,000 Units Subcutaneous 3 times per day  . hydrocortisone   Rectal QID  . metoCLOPramide (REGLAN) injection  10 mg Intravenous 3 times per day  . pantoprazole (PROTONIX) IV  40 mg Intravenous QHS  . traZODone  50 mg Oral QHS    Objective: Vital signs in last 24 hours: Temp:  [98 F (36.7 C)-98.6 F (37 C)] 98 F (36.7 C) (07/21 0531) Pulse Rate:  [92-96] 96 (07/21 0531) Resp:  [18-20] 20 (07/21 0531) BP: (118-133)/(69-73) 133/71 mmHg (07/21 0531) SpO2:  [98 %-100 %] 98 % (07/21 0531)   General appearance: alert, cooperative and no distress  Lab Results  Recent  Labs  10/17/14 0820 10/18/14 0300  WBC 6.9 6.9  HGB 9.2* 8.7*  HCT 28.5* 26.7*   Liver Panel No results for input(s): PROT, ALBUMIN, AST, ALT, ALKPHOS, BILITOT, BILIDIR, IBILI in the last 72 hours. Sedimentation Rate No results for input(s): ESRSEDRATE in the last 72 hours. C-Reactive Protein No results for input(s): CRP in the last 72 hours.  Microbiology: Recent Results (from the past 240 hour(s))  Culture, blood (routine x 2)     Status: None   Collection Time: 10/12/14  5:15 AM  Result Value Ref Range Status   Specimen Description BLOOD RIGHT FOREARM  Final   Special Requests BOTTLES DRAWN AEROBIC AND ANAEROBIC 5ML  Final   Culture   Final    NO GROWTH 5 DAYS Performed at [facility]    Report Status 10/17/2014 FINAL  Final  Culture, blood (routine x 2)     Status: None   Collection Time: 10/12/14  5:25 AM  Result Value Ref Range Status   Specimen Description BLOOD RIGHT ANTECUBITAL  Final   Special Requests BOTTLES DRAWN AEROBIC AND ANAEROBIC 5ML  Final   Culture   Final    NO GROWTH 5 DAYS Performed at [facility]    Report Status 10/17/2014 FINAL  Final  Culture, routine-abscess  Status: None   Collection Time: 10/12/14  3:21 PM  Result Value Ref Range Status   Specimen Description Retroperiton  Final   Special Requests Normal  Final   Gram Stain   Final    ABUNDANT WBC PRESENT, PREDOMINANTLY PMN NO SQUAMOUS EPITHELIAL CELLS SEEN ABUNDANT GRAM POSITIVE COCCI IN PAIRS IN CLUSTERS Performed at [facility]    Culture   Final    NO GROWTH 3 DAYS Performed at [facility]    Report Status 10/16/2014 FINAL  Final  Anaerobic culture     Status: None   Collection Time: 10/12/14  3:21 PM  Result Value Ref Range Status   Specimen Description ABSCESS  Final   Special Requests Normal  Final   Gram Stain   Final    ABUNDANT WBC PRESENT, PREDOMINANTLY PMN NO SQUAMOUS EPITHELIAL CELLS SEEN ABUNDANT GRAM POSITIVE COCCI IN  PAIRS IN CLUSTERS Performed at [facility]    Culture   Final    NO ANAEROBES ISOLATED Performed at [facility]    Report Status 10/17/2014 FINAL  Final  Fungus culture w smear     Status: None (Preliminary result)   Collection Time: 10/12/14  3:21 PM  Result Value Ref Range Status   Specimen Description ABSCESS  Final   Special Requests Normal  Final   Fungal Smear   Final    NO YEAST OR FUNGAL ELEMENTS SEEN Performed at [facility]    Culture   Final    CULTURE IN PROGRESS FOR FOUR WEEKS Performed at [facility]    Report Status PENDING  Incomplete    Studies/Results: No results found.   Assessment/Plan: L Retroperitoneal Abscess Liposarcoma Partial SBO  Total days of antibiotics: 7 (zosyn --> invanz)  Cont to have minimal drain output.  Await CT scan. I spoke with scanner, will be in next 30 min.  D/c-anbx rec when CT done No change in anbx for now         [name] Infectious Diseases (pager) [phone] [email or web address] 10/19/2014, 9:38 AM  LOS: 7 days

## 2014-10-19 NOTE — Progress Notes (Signed)
Referring Physician(s): CCS  Chief Complaint: Recurrent left RP abscess s/p new perc drain placed 7/14  Subjective: Patient is tolerating diet without N/V. He denies any fever or chills. He denies any worsening drain site pain, but does admit to some increased abdominal distention.  Allergies: Compazine  Medications: Prior to Admission medications   Medication Sig Start Date End Date Taking? Authorizing Provider  LORazepam (ATIVAN) 1 MG tablet Take 1 tablet by mouth 3 (three) times daily as needed for anxiety. Anxiety/nausea 09/01/14  Yes Historical Provider, MD  Melatonin 5 MG CAPS Take 1 capsule by mouth at bedtime.  07/27/14  Yes Historical Provider, MD  metoCLOPramide (REGLAN) 10 MG/10ML SOLN Take 10 mLs (10 mg total) by mouth 4 (four) times daily -  before meals and at bedtime. 08/21/14  Yes Tyler Pita, MD  Multiple Vitamin (MULTIVITAMIN WITH MINERALS) TABS tablet Take 1 tablet by mouth every morning.    Yes Historical Provider, MD  pantoprazole (PROTONIX) 40 MG tablet Take 1 tablet (40 mg total) by mouth daily. 09/22/14  Yes Donne Hazel, MD  traZODone (DESYREL) 50 MG tablet Take 50 mg by mouth at bedtime.    Yes Historical Provider, MD  ondansetron (ZOFRAN ODT) 8 MG disintegrating tablet Take 1 tablet (8 mg total) by mouth every 8 (eight) hours as needed for nausea or vomiting. 10/11/14   Jola Schmidt, MD  oxyCODONE-acetaminophen (PERCOCET/ROXICET) 5-325 MG per tablet Take 1-2 tablets by mouth every 6 (six) hours as needed for severe pain. 08/04/14   Stark Klein, MD    Vital Signs: BP 115/65 mmHg  Pulse 98  Temp(Src) 97.7 F (36.5 C) (Oral)  Resp 18  Ht 5\' 8"  (1.727 m)  Wt 174 lb 1.6 oz (78.971 kg)  BMI 26.48 kg/m2  SpO2 98%  Physical Exam General: A&ox3, NAD Abd: Soft, NT, ND, Left flank perc drain intact serous output 10cc in bulb, 10 cc/24 hrs  Imaging: Ct Abdomen Pelvis W Contrast  10/19/2014   CLINICAL DATA:  69 year-old male who underwent a left  nephrectomyand excision of retroperitoneal liposarcoma on 6/60/6301 complicated by development of a retroperitonealabscess. Percutaneous drain placed on 08/15/2014 , and drainage catheter removed on 09/26/2014. Recent presentation with nausea, vomiting and bloating and increased left retroperitoneal abscess with multiple smaller abscesses which communicate with the larger one and a small bowel obstruction with transition zone in the left upper abdomen. Status post repeat percutaneous left abdominal abscess drain placement on 10/12/2014.  Subsequent encounter.  EXAM: CT ABDOMEN AND PELVIS WITH CONTRAST  TECHNIQUE: Multidetector CT imaging of the abdomen and pelvis was performed using the standard protocol following bolus administration of intravenous contrast.  CONTRAST:  37mL OMNIPAQUE IOHEXOL 300 MG/ML  SOLN  COMPARISON:  Abdominal radiographs 10/16/2014 and earlier. CT drain placement 10/12/2014. CT Abdomen and Pelvis 10/12/2014, and earlier  FINDINGS: Stable small left pleural effusion. No pericardial effusion. Stable mild bibasilar atelectasis.  Stable visualized osseous structures.  Stable trace pelvic free fluid. Negative rectum. Negative urinary bladder.  Stable an mildly indistinct appearance of the sigmoid colon with underlying chronic diverticulosis.  The left colon now is decompressed. A portion of the left colon passes in proximity to the left retroperitoneal abscess and drain and remains moderately thickened. The splenic flexure has a more normal appearance. However, the distal transverse colon in the left upper quadrant now appears abnormally thickened, an the wall appears edematous. The dependent wall appears more hypodense (series 2, image 30), and this is in proximity to  1 of the residual abscess components near the lesser sac. A segment of about 9 cm is affected. See also coronal image 16.  Upstream of that segment of the transverse colon and right colon appear normal. The terminal ileum is  decompressed. There are multiple decompressed small bowel loops in the pelvis. There are multiple decompressed small bowel loops in the left lateral abdomen. There are upstream dilated small bowel loops with air and fluid levels, and once again the transition point appears to be the site of the left retroperitoneal abscess on series 2, image 37. Small bowel dilatation has slightly regressed  Since 10/12/2014. The stomach appears stable, including a dominant 3.7 cm lesser sac abscess (series 2, image 26), with evidence of smaller gastrosplenic ligament abscesses (image 22). The dominant abscess in the lesser sac no longer contains gas. The pancreas is abutted by the lesser sac abscess but otherwise enhances normally. No pancreatic ductal dilatation. The liver, gallbladder, right adrenal gland, and right kidney appear stable and within normal limits. Right renal contrast excretion an the right ureter appear within normal limits.  Posterior approach percutaneous retroperitoneal abscess drain remains in place with interval drainage of the dominant abscess (which measured up to 7.2 cm recently). Small volume residual left abscess adjacent to the left psoas muscle (series 2, image 38). Small volume residual gas. The spleen is abutted by these changes and appears stable. The left kidney is surgically absent, with numerous surgical clips also in the region.  The portal venous system appears patent. Aortoiliac calcified atherosclerosis noted. Major arterial structures in the abdomen and pelvis are patent. Trace abdominal free fluid adjacent to the liver is stable. No pneumoperitoneum.  IMPRESSION: 1. Satisfactory drainage of the dominant left retroperitoneal abscess following drain replacement recently. However, multiple regional daughter abscesses remain, including the largest which is 3.7 cm in the lesser sac. 2. Persistent small bowel obstruction, with transition point abutting the dominant draining abscess. Small bowel  dilatation perhaps slightly regressed in the last week. 3. Secondary inflammation of the descending colon near the dominant draining abscess. New 9 cm segment of thick walled distal transverse colon where sub serosal involvement by abscess is difficult to exclude (series 2, image 30). 4. Stable layering left pleural effusion and minor lung base atelectasis. Stable small perihepatic and pelvic free fluid.   Electronically Signed   By: Genevie Ann M.D.   On: 10/19/2014 11:31   Dg Abd 2 Views  10/16/2014   CLINICAL DATA:  Persistent abdominal pain and distention  EXAM: ABDOMEN - 2 VIEW  COMPARISON:  October 15, 2014  FINDINGS: Supine and upright images obtained. Compared to 1 day prior, there is no longer appreciable small bowel dilatation. Multiple air-fluid levels, however, do remain. No free air. Nasogastric tube tip and side port in stomach. Drain in the left abdomen remains. Multiple surgical clips are noted on the left, stable.  IMPRESSION: Resolution of bowel dilatation. Multiple air-fluid levels remain. Nasogastric tube tip and side port in stomach. Suspect partial but incomplete resolution of bowel obstruction. No free air.   Electronically Signed   By: Lowella Grip III M.D.   On: 10/16/2014 07:29    Labs:  CBC:  Recent Labs  10/13/14 0510 10/16/14 0612 10/17/14 0820 10/18/14 0300  WBC 6.2 7.2 6.9 6.9  HGB 9.4* 9.8* 9.2* 8.7*  HCT 27.9* 30.7* 28.5* 26.7*  PLT 599* 634* 579* 491*    COAGS:  Recent Labs  07/13/14 1939  07/14/14 1055 07/14/14 1830 07/15/14 0058  07/15/14 0515 07/16/14 0700 09/18/14 1400  INR 1.42  --  1.20  --   --   --  1.21 1.06  APTT  --   < > 33 36 31 33  --  30  < > = values in this interval not displayed.  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 Kathlene Cote and fluid collection by lesser sac adjacent to stomach and posterior to stomach are unchanged and stable and not amendable to percutaneous approach, drain catheter that is in place-collection has resolved and can be removed if minimal output over next 24 hrs. Plans per [facility]CS   Signed: Hedy Jacob 10/19/2014, 3:29 PM   I spent a total of 15 Minutes in face to face in clinical consultation/evaluation, greater than 50% of which was counseling/coordinating care for recurrent retroperitoneal abscess.

## 2014-10-20 LAB — BASIC METABOLIC PANEL
Anion gap: 6 (ref 5–15)
BUN: 9 mg/dL (ref 6–20)
CALCIUM: 8 mg/dL — AB (ref 8.9–10.3)
CHLORIDE: 107 mmol/L (ref 101–111)
CO2: 25 mmol/L (ref 22–32)
Creatinine, Ser: 1.11 mg/dL (ref 0.61–1.24)
GFR calc Af Amer: >60 mL/min (ref >60)
GFR calc non Af Amer: >60 mL/min (ref >60)
GLUCOSE: 99 mg/dL (ref 65–99)
Potassium: 3.8 mmol/L (ref 3.5–5.1)
Sodium: 138 mmol/L (ref 135–145)

## 2014-10-20 LAB — CBC
HCT: 26.5 % — ABNORMAL LOW (ref 39.0–52.0)
HEMOGLOBIN: 8.5 g/dL — AB (ref 13.0–17.0)
MCH: 28.1 pg (ref 26.0–34.0)
MCHC: 32.1 g/dL (ref 30.0–36.0)
MCV: 87.5 fL (ref 78.0–100.0)
Platelets: 483 10*3/uL — ABNORMAL HIGH (ref 150–400)
RBC: 3.03 MIL/uL — ABNORMAL LOW (ref 4.22–5.81)
RDW: 16.7 % — AB (ref 11.5–15.5)
WBC: 5.4 10*3/uL (ref 4.0–10.5)

## 2014-10-20 MED ORDER — PANTOPRAZOLE SODIUM 40 MG PO TBEC
40.0000 mg | DELAYED_RELEASE_TABLET | Freq: Every day | ORAL | Status: DC
  Filled 2014-10-20: qty 1

## 2014-10-20 MED ORDER — FERROUS SULFATE 325 (65 FE) MG PO TABS: 325.0000 mg | ORAL_TABLET | Freq: Two times a day (BID) | ORAL | Status: DC

## 2014-10-20 MED ORDER — SACCHAROMYCES BOULARDII 250 MG PO CAPS: 250.0000 mg | ORAL_CAPSULE | Freq: Two times a day (BID) | ORAL | Status: DC

## 2014-10-20 MED ORDER — SODIUM CHLORIDE 0.9 % IV SOLN: 1.0000 g | Freq: Every day | INTRAVENOUS | Status: DC

## 2014-10-20 MED ORDER — HYDROCODONE-ACETAMINOPHEN 5-325 MG PO TABS: 1.0000 | ORAL_TABLET | Freq: Four times a day (QID) | ORAL | Status: DC | PRN

## 2014-10-20 MED ORDER — HEPARIN SOD (PORK) LOCK FLUSH 100 UNIT/ML IV SOLN
250.0000 [IU] | INTRAVENOUS | Status: AC | PRN
  Administered 2014-10-20: 500 [IU]

## 2014-10-20 NOTE — Care Management Note (Signed)
Case Management Note  Patient Details  Name: Darryl Griffith MRN: [id number] Date of Birth: September 10, 1945  Subjective/Objective:    [facility] IV liason Pam already following. She will cotnact spouse for initial teaching. Await script for long term iv abx.[facility] rep aware of [place], [place] Nurse's aide order.                Action/Plan:d/c plan home w/[facility]-iv abx, aide.   Expected Discharge Date:                  Expected Discharge Plan:  [place]  In-House Referral:     Discharge planning Services  CM Consult  Post Acute Care Choice:    Choice offered to:     DME Arranged:    DME Agency:  [facility] Arranged:  RN, IV Antibiotics HH Agency:     Status of Service:  In process, will continue to follow  Medicare Important Message Given:  Yes-third notification given Date Medicare IM Given:    Medicare IM give by:    Date Additional Medicare IM Given:    Additional Medicare Important Message give by:     If discussed at [place] of Stay Meetings, dates discussed:    Additional Comments:  Dessa Phi, RN 10/20/2014, 12:56 PM

## 2014-10-20 NOTE — Discharge Instructions (Addendum)
[facility], [place] [phone]  OPEN ABDOMINAL SURGERY: POST OP INSTRUCTIONS  Always review your discharge instruction sheet given to you by the facility where your surgery was performed.  IF YOU HAVE DISABILITY OR FAMILY LEAVE FORMS, YOU MUST BRING THEM TO THE OFFICE FOR PROCESSING.  PLEASE DO NOT GIVE THEM TO YOUR DOCTOR.  1. A prescription for pain medication may be given to you upon discharge.  Take your pain medication as prescribed, if needed.  If narcotic pain medicine is not needed, then you may take acetaminophen (Tylenol) or ibuprofen (Advil) as needed. 2. Take your usually prescribed medications unless otherwise directed. 3. If you need a refill on your pain medication, please contact your pharmacy. They will contact our office to request authorization.  Prescriptions will not be filled after 5pm or on week-ends. 4. You should follow a light diet the first few days after arrival home, such as soup and crackers, pudding, etc.unless your doctor has advised otherwise. A high-fiber, low fat diet can be resumed as tolerated.   Be sure to include lots of fluids daily. Most patients will experience some swelling and bruising on the chest and neck area.  Ice packs will help.  Swelling and bruising can take several days to resolve 5. Most patients will experience some swelling and bruising in the area of the incision. Ice pack will help. Swelling and bruising can take several days to resolve..  6. It is common to experience some constipation if taking pain medication after surgery.  Increasing fluid intake and taking a stool softener will usually help or prevent this problem from occurring.  A mild laxative (Milk of Magnesia or Miralax) should be taken according to package directions if there are no bowel movements after 48 hours. 7.  You may have steri-strips (small skin tapes) in place directly over the incision.  These strips should be left on the skin for 7-10 days.  If  your surgeon used skin glue on the incision, you may shower in 24 hours.  The glue will flake off over the next 2-3 weeks.  Any sutures or staples will be removed at the office during your follow-up visit. You may find that a light gauze bandage over your incision may keep your staples from being rubbed or pulled. You may shower and replace the bandage daily. 8. ACTIVITIES:  You may resume regular (light) daily activities beginning the next day--such as daily self-care, walking, climbing stairs--gradually increasing activities as tolerated.  You may have sexual intercourse when it is comfortable.  Refrain from any heavy lifting or straining until approved by your doctor. a. You may drive when you no longer are taking prescription pain medication, you can comfortably wear a seatbelt, and you can safely maneuver your car and apply brakes b. Return to Work: ___________________________________ 37. You should see your doctor in the office for a follow-up appointment approximately two weeks after your surgery.  Make sure that you call for this appointment within a day or two after you arrive home to insure a convenient appointment time. OTHER INSTRUCTIONS:  _____________________________________________________________ _____________________________________________________________  WHEN TO CALL YOUR DOCTOR: 1. Fever over 101.0 2. Inability to urinate 3. Nausea and/or vomiting 4. Extreme swelling or bruising 5. Continued bleeding from incision. 6. Increased pain, redness, or drainage from the incision. 7. Difficulty swallowing or breathing 8. Muscle cramping or spasms. 9. Numbness or tingling in hands or feet or around lips.  The clinic staff is available to  answer your questions during regular business hours.  Please dont hesitate to call and ask to speak to one of the nurses if you have concerns.  For further questions, please visit [email or web address]   Bulb Drain Home Care A bulb  drain consists of a thin rubber tube and a soft, round bulb that creates a gentle suction. The rubber tube is placed in the area where you had surgery. A bulb is attached to the end of the tube that is outside the body. The bulb drain removes excess fluid that normally builds up in a surgical wound after surgery. The color and amount of fluid will vary. Immediately after surgery, the fluid is bright red and is a little thicker than water. It may gradually change to a yellow or pink color and become more thin and water-like. When the amount decreases to about 1 or 2 tbsp in 24 hours, your health care provider will usually remove it. DAILY CARE  Keep the bulb flat (compressed) at all times, except while emptying it. The flatness creates suction. You can flatten the bulb by squeezing it firmly in the middle and then closing the cap.  Keep sites where the tube enters the skin dry and covered with a bandage (dressing).  Secure the tube 1-2 in (2.5-5.1 cm) below the insertion sites to keep it from pulling on your stitches. The tube is stitched in place and will not slip out.  Secure the bulb as directed by your health care provider.  For the first 3 days after surgery, there usually is more fluid in the bulb. Empty the bulb whenever it becomes half full because the bulb does not create enough suction if it is too full. The bulb could also overflow. Write down how much fluid you remove each time you empty your drain. Add up the amount removed in 24 hours.  Empty the bulb at the same time every day once the amount of fluid decreases and you only need to empty it once a day. Write down the amounts and the 24-hour totals to give to your health care provider. This helps your health care provider know when the tubes can be removed. EMPTYING THE BULB DRAIN Before emptying the bulb, get a measuring cup, a piece of paper and a pen, and wash your hands.  Gently run your fingers down the tube (stripping) to empty  any drainage from the tubing into the bulb. This may need to be done several times a day to clear the tubing of clots and tissue.  Open the bulb cap to release suction, which causes it to inflate. Do not touch the inside of the cap.  Gently run your fingers down the tube (stripping) to empty any drainage from the tubing into the bulb.  Hold the cap out of the way, and pour fluid into the measuring cup.   Squeeze the bulb to provide suction.  Replace the cap.   Check the tape that holds the tube to your skin. If it is becoming loose, you can remove the loose piece of tape and apply a new one. Then, pin the bulb to your shirt.   Write down the amount of fluid you emptied out. Write down the date and each time you emptied your bulb drain. (If there are 2 bulbs, note the amount of drainage from each bulb and keep the totals separate. Your health care provider will want to know the total amounts for each drain and which tube is draining more.)  Flush the fluid down the toilet and wash your hands.   Call your health care provider once you have less than 2 tbsp of fluid collecting in the bulb drain every 24 hours. If there is drainage around the tube site, change dressings and keep the area dry. Cleanse around tube with sterile saline and place dry gauze around site. This gauze should be changed when it is soiled. If it stays clean and unsoiled, it should still be changed daily.  SEEK MEDICAL CARE IF: 1. Your drainage has a bad smell or is cloudy.  2. You have a fever.  3. Your drainage is increasing instead of decreasing.  4. Your tube fell out.  5. You have redness or swelling around the tube site.  6. You have drainage from a surgical wound.  7. Your bulb drain will not stay flat after you empty it.  MAKE SURE YOU:   Understand these instructions.  Will watch your condition.  Will get help right away if you are not doing well or get worse. Document Released: 03/14/2000  Document Revised: 08/01/2013 Document Reviewed: 08/20/2011 [facility] Patient Information 2015 [place], [place]. This information is not intended to replace advice given to you by your health care provider. Make sure you discuss any questions you have with your health care provider.       Bulb Drain Totals  Bring this sheet to all of your post-operative appointments while you have your drains.  Please measure your drains by CC's or ML's.  Make sure you drain and measure your JP Drains 3 times per day.  At the end of each day, add up totals for the left side and add up totals for the right side.    ( 9 am )     ( 3 pm )        ( 9 pm )                Date L  R  L  R  L  R  Total L/R

## 2014-10-20 NOTE — Progress Notes (Signed)
INFECTIOUS DISEASE PROGRESS NOTE  ID: Darryl Griffith is a 69 y.o. male with  Active Problems:   Abdominal abscess   Small bowel obstruction   Intra-abdominal fluid collection   Partial small bowel obstruction   Protein-calorie malnutrition  Subjective: Without complaints  Abtx:  Anti-infectives    Start     Dose/Rate Route Frequency Ordered Stop   10/20/14 0000  ertapenem 1 g in sodium chloride 0.9 % 50 mL     1 g 100 mL/hr over 30 Minutes Intravenous Daily after supper 10/20/14 1403 11/10/14 2359   10/13/14 1845  ertapenem (INVANZ) 1 g in sodium chloride 0.9 % 50 mL IVPB     1 g 100 mL/hr over 30 Minutes Intravenous Daily after supper 10/13/14 1816     10/12/14 2000  piperacillin-tazobactam (ZOSYN) IVPB 3.375 g  Status:  Discontinued     3.375 g 12.5 mL/hr over 240 Minutes Intravenous Every 8 hours 10/12/14 1923 10/13/14 1816   10/12/14 0500  vancomycin (VANCOCIN) 1,500 mg in sodium chloride 0.9 % 500 mL IVPB     1,500 mg 250 mL/hr over 120 Minutes Intravenous  Once 10/12/14 0454 10/12/14 0725   10/12/14 0500  piperacillin-tazobactam (ZOSYN) IVPB 4.5 g  Status:  Discontinued     4.5 g 200 mL/hr over 30 Minutes Intravenous  Once 10/12/14 0454 10/12/14 0457   10/12/14 0500  piperacillin-tazobactam (ZOSYN) IVPB 3.375 g     3.375 g 100 mL/hr over 30 Minutes Intravenous  Once 10/12/14 0458 10/12/14 0550      Medications:  Scheduled: . ertapenem  1 g Intravenous QPC supper  . ferrous sulfate  325 mg Oral BID WC  . heparin subcutaneous  5,000 Units Subcutaneous 3 times per day  . hydrocortisone   Rectal QID  . metoCLOPramide (REGLAN) injection  10 mg Intravenous 3 times per day  . pantoprazole  40 mg Oral QHS  . saccharomyces boulardii  250 mg Oral BID  . traZODone  50 mg Oral QHS    Objective: Vital signs in last 24 hours: Temp:  [98 F (36.7 C)-98.7 F (37.1 C)] 98 F (36.7 C) (07/22 0515) Pulse Rate:  [101-104] 104 (07/22 0515) Resp:  [18] 18 (07/22 0515) BP:  (117-140)/(77-79) 140/77 mmHg (07/22 0515) SpO2:  [98 %-99 %] 99 % (07/22 0515)   General appearance: alert, cooperative and no distress  Lab Results  Recent Labs  10/18/14 0300 10/20/14 0500  WBC 6.9 5.4  HGB 8.7* 8.5*  HCT 26.7* 26.5*  NA  --  138  K  --  3.8  CL  --  107  CO2  --  25  BUN  --  9  CREATININE  --  1.11   Liver Panel No results for input(s): PROT, ALBUMIN, AST, ALT, ALKPHOS, BILITOT, BILIDIR, IBILI in the last 72 hours. Sedimentation Rate No results for input(s): ESRSEDRATE in the last 72 hours. C-Reactive Protein No results for input(s): CRP in the last 72 hours.  Microbiology: Recent Results (from the past 240 hour(s))  Culture, blood (routine x 2)     Status: None   Collection Time: 10/12/14  5:15 AM  Result Value Ref Range Status   Specimen Description BLOOD RIGHT FOREARM  Final   Special Requests BOTTLES DRAWN AEROBIC AND ANAEROBIC 5ML  Final   Culture   Final    NO GROWTH 5 DAYS Performed at [facility]    Report Status 10/17/2014 FINAL  Final  Culture, blood (routine x 2)  Status: None   Collection Time: 10/12/14  5:25 AM  Result Value Ref Range Status   Specimen Description BLOOD RIGHT ANTECUBITAL  Final   Special Requests BOTTLES DRAWN AEROBIC AND ANAEROBIC 5ML  Final   Culture   Final    NO GROWTH 5 DAYS Performed at [facility]    Report Status 10/17/2014 FINAL  Final  Culture, routine-abscess     Status: None   Collection Time: 10/12/14  3:21 PM  Result Value Ref Range Status   Specimen Description Retroperiton  Final   Special Requests Normal  Final   Gram Stain   Final    ABUNDANT WBC PRESENT, PREDOMINANTLY PMN NO SQUAMOUS EPITHELIAL CELLS SEEN ABUNDANT GRAM POSITIVE COCCI IN PAIRS IN CLUSTERS Performed at [facility]    Culture   Final    NO GROWTH 3 DAYS Performed at [facility]    Report Status 10/16/2014 FINAL  Final  Anaerobic culture     Status: None   Collection Time:  10/12/14  3:21 PM  Result Value Ref Range Status   Specimen Description ABSCESS  Final   Special Requests Normal  Final   Gram Stain   Final    ABUNDANT WBC PRESENT, PREDOMINANTLY PMN NO SQUAMOUS EPITHELIAL CELLS SEEN ABUNDANT GRAM POSITIVE COCCI IN PAIRS IN CLUSTERS Performed at [facility]    Culture   Final    NO ANAEROBES ISOLATED Performed at [facility]    Report Status 10/17/2014 FINAL  Final  Fungus culture w smear     Status: None (Preliminary result)   Collection Time: 10/12/14  3:21 PM  Result Value Ref Range Status   Specimen Description ABSCESS  Final   Special Requests Normal  Final   Fungal Smear   Final    NO YEAST OR FUNGAL ELEMENTS SEEN Performed at [facility]    Culture   Final    CULTURE IN PROGRESS FOR FOUR WEEKS Performed at [facility]    Report Status PENDING  Incomplete    Studies/Results: Ct Abdomen Pelvis W Contrast  10/19/2014   CLINICAL DATA:  69 year-old male who 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 Percutaneous drain placed on 08/15/2014 , and drainage catheter removed on 09/26/2014. Recent presentation with nausea, vomiting and bloating and increased left retroperitoneal abscess with multiple smaller abscesses which communicate with the larger one and a small bowel obstruction with transition zone in the left upper abdomen. Status post repeat percutaneous left abdominal abscess drain placement on 10/12/2014.  Subsequent encounter.  EXAM: CT ABDOMEN AND PELVIS WITH CONTRAST  TECHNIQUE: Multidetector CT imaging of the abdomen and pelvis was performed using the standard protocol following bolus administration of intravenous contrast.  CONTRAST:  50mL OMNIPAQUE IOHEXOL 300 MG/ML  SOLN  COMPARISON:  Abdominal radiographs 10/16/2014 and earlier. CT drain placement 10/12/2014. CT Abdomen and Pelvis 10/12/2014, and earlier   FINDINGS: Stable small left pleural effusion. No pericardial effusion. Stable mild bibasilar atelectasis.  Stable visualized osseous structures.  Stable trace pelvic free fluid. Negative rectum. Negative urinary bladder.  Stable an mildly indistinct appearance of the sigmoid colon with underlying chronic diverticulosis.  The left colon now is decompressed. A portion of the left colon passes in proximity to the left retroperitoneal abscess and drain and remains moderately thickened. The splenic flexure has a more normal appearance. However, the distal transverse colon in the left upper quadrant now appears abnormally  thickened, an the wall appears edematous. The dependent wall appears more hypodense (series 2, image 30), and this is in proximity to 1 of the residual abscess components near the lesser sac. A segment of about 9 cm is affected. See also coronal image 16.  Upstream of that segment of the transverse colon and right colon appear normal. The terminal ileum is decompressed. There are multiple decompressed small bowel loops in the pelvis. There are multiple decompressed small bowel loops in the left lateral abdomen. There are upstream dilated small bowel loops with air and fluid levels, and once again the transition point appears to be the site of the left retroperitoneal abscess on series 2, image 37. Small bowel dilatation has slightly regressed  Since 10/12/2014. The stomach appears stable, including a dominant 3.7 cm lesser sac abscess (series 2, image 26), with evidence of smaller gastrosplenic ligament abscesses (image 22). The dominant abscess in the lesser sac no longer contains gas. The pancreas is abutted by the lesser sac abscess but otherwise enhances normally. No pancreatic ductal dilatation. The liver, gallbladder, right adrenal gland, and right kidney appear stable and within normal limits. Right renal contrast excretion an the right ureter appear within normal limits.  Posterior approach  percutaneous retroperitoneal abscess drain remains in place with interval drainage of the dominant abscess (which measured up to 7.2 cm recently). Small volume residual left abscess adjacent to the left psoas muscle (series 2, image 38). Small volume residual gas. The spleen is abutted by these changes and appears stable. The left kidney is surgically absent, with numerous surgical clips also in the region.  The portal venous system appears patent. Aortoiliac calcified atherosclerosis noted. Major arterial structures in the abdomen and pelvis are patent. Trace abdominal free fluid adjacent to the liver is stable. No pneumoperitoneum.  IMPRESSION: 1. Satisfactory drainage of the dominant left retroperitoneal abscess following drain replacement recently. However, multiple regional daughter abscesses remain, including the largest which is 3.7 cm in the lesser sac. 2. Persistent small bowel obstruction, with transition point abutting the dominant draining abscess. Small bowel dilatation perhaps slightly regressed in the last week. 3. Secondary inflammation of the descending colon near the dominant draining abscess. New 9 cm segment of thick walled distal transverse colon where sub serosal involvement by abscess is difficult to exclude (series 2, image 30). 4. Stable layering left pleural effusion and minor lung base atelectasis. Stable small perihepatic and pelvic free fluid.   Electronically Signed   By: Genevie Ann M.D.   On: 10/19/2014 11:31     Assessment/Plan: L Retroperitoneal Abscess Liposarcoma Partial SBO  Total days of antibiotics: 8 (zosyn --> invanz)  Would- continue anbx for 3 weeks then re-image Have him f/u in ID clinic in 3 weeks.  Defer to IR regarding drain.  D/i pt.  My great appreciation to surgery, PA [facility] (pager) [phone] [email or web address] 10/20/2014, 2:45 PM  LOS: 8 days

## 2014-10-20 NOTE — Progress Notes (Signed)
[facility]  Patient Status:    New pt for [facility] this admission  [facility] is providing the following services: HHRN and [facility] team for home IV ABX. In hospital teaching with wife for home IV ABX. Wife did very well with mock set up /admin of IV ABX.  [facility] HHRN will support continued teaching at home.   If patient discharges after hours, please call [phone].   [name] 10/20/2014, 3:37 PM

## 2014-10-20 NOTE — Progress Notes (Signed)
[facility] Progress Note     Subjective: Pt had 2 episodes of vomiting yesterday.  He relates it to Iron, but he hasn't had Iron since yesterday am.  He's tolerating breakfast well today.  No current N/V.  Ambulating well.  Objective: Vital signs in last 24 hours: Temp:  [97.7 F (36.5 C)-98.7 F (37.1 C)] 98 F (36.7 C) (07/22 0515) Pulse Rate:  [98-104] 104 (07/22 0515) Resp:  [18] 18 (07/22 0515) BP: (115-140)/(65-79) 140/77 mmHg (07/22 0515) SpO2:  [98 %-99 %] 99 % (07/22 0515) Last BM Date: 10/19/14  Intake/Output from previous day: 07/21 0701 - 07/22 0700 In: 2099.2 [P.O.:840; I.V.:1214.2; IV Piggyback:25] Out: 910 [Urine:875; Drains:35] Intake/Output this shift:    PE: Gen:  Alert, NAD, pleasant Abd: Soft, NT/ND, +BS, no HSM, abdominal scars noted, bulb drain with clear serous drainage 39mL/24hr.    Lab Results:   Recent Labs  10/18/14 0300 10/20/14 0500  WBC 6.9 5.4  HGB 8.7* 8.5*  HCT 26.7* 26.5*  PLT 491* 483*   BMET  Recent Labs  10/20/14 0500  NA 138  K 3.8  CL 107  CO2 25  GLUCOSE 99  BUN 9  CREATININE 1.11  CALCIUM 8.0*   PT/INR No results for input(s): LABPROT, INR in the last 72 hours. CMP     Component Value Date/Time   NA 138 10/20/2014 0500   NA 131* 09/18/2014 0959   K 3.8 10/20/2014 0500   K 4.4 09/18/2014 0959   CL 107 10/20/2014 0500   CO2 25 10/20/2014 0500   CO2 26 09/18/2014 0959   GLUCOSE 99 10/20/2014 0500   GLUCOSE 159* 09/18/2014 0959   BUN 9 10/20/2014 0500   BUN 38.6* 09/18/2014 0959   CREATININE 1.11 10/20/2014 0500   CREATININE 1.0 09/18/2014 0959   CALCIUM 8.0* 10/20/2014 0500   CALCIUM 8.8 09/18/2014 0959   PROT 6.6 10/11/2014 1705   PROT 5.3* 09/18/2014 0959   ALBUMIN 2.4* 10/11/2014 1705   ALBUMIN 2.4* 09/18/2014 0959   AST 24 10/11/2014 1705   AST 17 09/18/2014 0959   ALT 22 10/11/2014 1705   ALT 12 09/18/2014 0959   ALKPHOS 101 10/11/2014 1705   ALKPHOS 65 09/18/2014 0959    BILITOT 0.5 10/11/2014 1705   BILITOT <0.20 09/18/2014 0959   GFRNONAA >60 10/20/2014 0500   GFRAA >60 10/20/2014 0500   Lipase     Component Value Date/Time   LIPASE 17* 10/11/2014 1705       Studies/Results: Ct Abdomen Pelvis W Contrast  10/19/2014   CLINICAL DATA:  69 year-old male who underwent a left nephrectomyand excision of retroperitoneal liposarcoma on 07/07/8117 complicated by development of a retroperitonealabscess. Percutaneous drain placed on 08/15/2014 , and drainage catheter removed on 09/26/2014. Recent presentation with nausea, vomiting and bloating and increased left retroperitoneal abscess with multiple smaller abscesses which communicate with the larger one and a small bowel obstruction with transition zone in the left upper abdomen. Status post repeat percutaneous left abdominal abscess drain placement on 10/12/2014.  Subsequent encounter.  EXAM: CT ABDOMEN AND PELVIS WITH CONTRAST  TECHNIQUE: Multidetector CT imaging of the abdomen and pelvis was performed using the standard protocol following bolus administration of intravenous contrast.  CONTRAST:  69mL OMNIPAQUE IOHEXOL 300 MG/ML  SOLN  COMPARISON:  Abdominal radiographs 10/16/2014 and earlier. CT drain placement 10/12/2014. CT Abdomen and Pelvis 10/12/2014, and earlier  FINDINGS: Stable small left pleural effusion. No pericardial effusion. Stable mild bibasilar atelectasis.  Stable visualized osseous  structures.  Stable trace pelvic free fluid. Negative rectum. Negative urinary bladder.  Stable an mildly indistinct appearance of the sigmoid colon with underlying chronic diverticulosis.  The left colon now is decompressed. A portion of the left colon passes in proximity to the left retroperitoneal abscess and drain and remains moderately thickened. The splenic flexure has a more normal appearance. However, the distal transverse colon in the left upper quadrant now appears abnormally thickened, an the wall appears edematous. The  dependent wall appears more hypodense (series 2, image 30), and this is in proximity to 1 of the residual abscess components near the lesser sac. A segment of about 9 cm is affected. See also coronal image 16.  Upstream of that segment of the transverse colon and right colon appear normal. The terminal ileum is decompressed. There are multiple decompressed small bowel loops in the pelvis. There are multiple decompressed small bowel loops in the left lateral abdomen. There are upstream dilated small bowel loops with air and fluid levels, and once again the transition point appears to be the site of the left retroperitoneal abscess on series 2, image 37. Small bowel dilatation has slightly regressed  Since 10/12/2014. The stomach appears stable, including a dominant 3.7 cm lesser sac abscess (series 2, image 26), with evidence of smaller gastrosplenic ligament abscesses (image 22). The dominant abscess in the lesser sac no longer contains gas. The pancreas is abutted by the lesser sac abscess but otherwise enhances normally. No pancreatic ductal dilatation. The liver, gallbladder, right adrenal gland, and right kidney appear stable and within normal limits. Right renal contrast excretion an the right ureter appear within normal limits.  Posterior approach percutaneous retroperitoneal abscess drain remains in place with interval drainage of the dominant abscess (which measured up to 7.2 cm recently). Small volume residual left abscess adjacent to the left psoas muscle (series 2, image 38). Small volume residual gas. The spleen is abutted by these changes and appears stable. The left kidney is surgically absent, with numerous surgical clips also in the region.  The portal venous system appears patent. Aortoiliac calcified atherosclerosis noted. Major arterial structures in the abdomen and pelvis are patent. Trace abdominal free fluid adjacent to the liver is stable. No pneumoperitoneum.  IMPRESSION: 1. Satisfactory  drainage of the dominant left retroperitoneal abscess following drain replacement recently. However, multiple regional daughter abscesses remain, including the largest which is 3.7 cm in the lesser sac. 2. Persistent small bowel obstruction, with transition point abutting the dominant draining abscess. Small bowel dilatation perhaps slightly regressed in the last week. 3. Secondary inflammation of the descending colon near the dominant draining abscess. New 9 cm segment of thick walled distal transverse colon where sub serosal involvement by abscess is difficult to exclude (series 2, image 30). 4. Stable layering left pleural effusion and minor lung base atelectasis. Stable small perihepatic and pelvic free fluid.   Electronically Signed   By: Genevie Ann M.D.   On: 10/19/2014 11:31    Anti-infectives: Anti-infectives    Start     Dose/Rate Route Frequency Ordered Stop   10/13/14 1845  ertapenem (INVANZ) 1 g in sodium chloride 0.9 % 50 mL IVPB     1 g 100 mL/hr over 30 Minutes Intravenous Daily after supper 10/13/14 1816     10/12/14 2000  piperacillin-tazobactam (ZOSYN) IVPB 3.375 g  Status:  Discontinued     3.375 g 12.5 mL/hr over 240 Minutes Intravenous Every 8 hours 10/12/14 1923 10/13/14 1816   10/12/14 0500  vancomycin (VANCOCIN) 1,500 mg in sodium chloride 0.9 % 500 mL IVPB     1,500 mg 250 mL/hr over 120 Minutes Intravenous  Once 10/12/14 0454 10/12/14 0725   10/12/14 0500  piperacillin-tazobactam (ZOSYN) IVPB 4.5 g  Status:  Discontinued     4.5 g 200 mL/hr over 30 Minutes Intravenous  Once 10/12/14 0454 10/12/14 0457   10/12/14 0500  piperacillin-tazobactam (ZOSYN) IVPB 3.375 g     3.375 g 100 mL/hr over 30 Minutes Intravenous  Once 10/12/14 0458 10/12/14 0550       Assessment/Plan 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  -NG discontinued 7/18, tolerating soft diet, having diarrhea -Thorazine for hiccups  -Zofran and reglan -Continue probiotic Multiple intra-abdominal abscesses-s/p drain 7/14. Aerobic/anaerobic cx NGTD.  Fungal cx still pending (gram positive cocci in pairs/clusters on gram stain). Now on Invanz Day #7/???. Will need long term antibiotics for multiple abscesses not amendable to drainage. ID following, length of treatment per ID -Drain clear with low output, will maintain for now as Dr. Excell Seltzer is hesitant to remove, can be seen in drain clinic as an outpatient Anemia of chronic disease/Iron deficiency-s/p 2 units of PRBCs 7/14. Hgb down to 8.5. Iron and TIBC low, FOBT positive. Continue iron 325mg  BID Renal insufficiency- sCr normalized, adequate UOP. Continue IVF. Avoid nephrotoxins.  Hemorrhoids - anusol Deconditioning - resolved, no PT needed FEN - soft diet VTE prophylaxis-SCD, heparin SQ Dispo - Home today once we have ID's recommendations.  Ordered HH services for IV antibiotic infusion/drain management.  Follow up with Dr. Barry Dienes    LOS: 8 days    Nat Christen 10/20/2014, 8:10 AM Pager: [phone]

## 2014-10-20 NOTE — Discharge Summary (Signed)
[facility] Discharge Summary   Patient ID: Darryl Griffith MRN: [id number] DOB/AGE: Aug 17, 1945 69 y.o.  Admit date: 10/11/2014 Discharge date: 10/20/2014  Admitting Diagnosis: Intra-abdominal abscesses Small bowel obstruction Anemia - now found to be Iron deficiency Chronic renal insufficiency  Discharge Diagnosis Patient Active Problem List   Diagnosis Date Noted  . Protein-calorie malnutrition   . Partial small bowel obstruction   . Abdominal abscess 10/12/2014  . Small bowel obstruction 10/12/2014  . Intra-abdominal fluid collection   . Melena 09/18/2014  . Hyponatremia 09/18/2014  . Hyperglycemia 09/18/2014  . Hypoalbuminemia 09/18/2014  . Acute post-hemorrhagic anemia 09/18/2014  . Protein-calorie malnutrition, severe 08/02/2014  . Retroperitoneal fluid collection   . Hypertension   . 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 Johnnye Sima, Dr. Linus Salmons - ID   Imaging: Ct Abdomen Pelvis W Contrast  10/19/2014   CLINICAL DATA:  69 year-old male who underwent a left nephrectomyand excision of retroperitoneal liposarcoma on 0/99/8338 complicated by development of a retroperitonealabscess. Percutaneous drain placed on 08/15/2014 , and drainage catheter removed on 09/26/2014. Recent presentation with nausea, vomiting and bloating and increased left retroperitoneal abscess with multiple smaller abscesses which communicate with the larger one and a small bowel obstruction with transition zone in the left upper abdomen. Status post repeat percutaneous left abdominal abscess drain placement on 10/12/2014.  Subsequent encounter.  EXAM: CT ABDOMEN AND PELVIS WITH CONTRAST  TECHNIQUE: Multidetector CT imaging of the abdomen and pelvis was performed using the standard protocol following bolus administration of intravenous contrast.  CONTRAST:  45mL OMNIPAQUE IOHEXOL 300 MG/ML  SOLN   COMPARISON:  Abdominal radiographs 10/16/2014 and earlier. CT drain placement 10/12/2014. CT Abdomen and Pelvis 10/12/2014, and earlier  FINDINGS: Stable small left pleural effusion. No pericardial effusion. Stable mild bibasilar atelectasis.  Stable visualized osseous structures.  Stable trace pelvic free fluid. Negative rectum. Negative urinary bladder.  Stable an mildly indistinct appearance of the sigmoid colon with underlying chronic diverticulosis.  The left colon now is decompressed. A portion of the left colon passes in proximity to the left retroperitoneal abscess and drain and remains moderately thickened. The splenic flexure has a more normal appearance. However, the distal transverse colon in the left upper quadrant now appears abnormally thickened, an the wall appears edematous. The dependent wall appears more hypodense (series 2, image 30), and this is in proximity to 1 of the residual abscess components near the lesser sac. A segment of about 9 cm is affected. See also coronal image 16.  Upstream of that segment of the transverse colon and right colon appear normal. The terminal ileum is decompressed. There are multiple decompressed small bowel loops in the pelvis. There are multiple decompressed small bowel loops in the left lateral abdomen. There are upstream dilated small bowel loops with air and fluid levels, and once again the transition point appears to be the site of the left retroperitoneal abscess on series 2, image 37. Small bowel dilatation has slightly regressed  Since 10/12/2014. The stomach appears stable, including a dominant 3.7 cm lesser sac abscess (series 2, image 26), with evidence of smaller gastrosplenic ligament abscesses (image 22). The dominant abscess in the lesser sac no longer contains gas. The pancreas is abutted by the lesser sac abscess but otherwise enhances normally. No pancreatic ductal dilatation. The liver, gallbladder, right adrenal gland, and right kidney appear  stable and within normal limits. Right renal contrast excretion  an the right ureter appear within normal limits.  Posterior approach percutaneous retroperitoneal abscess drain remains in place with interval drainage of the dominant abscess (which measured up to 7.2 cm recently). Small volume residual left abscess adjacent to the left psoas muscle (series 2, image 38). Small volume residual gas. The spleen is abutted by these changes and appears stable. The left kidney is surgically absent, with numerous surgical clips also in the region.  The portal venous system appears patent. Aortoiliac calcified atherosclerosis noted. Major arterial structures in the abdomen and pelvis are patent. Trace abdominal free fluid adjacent to the liver is stable. No pneumoperitoneum.  IMPRESSION: 1. Satisfactory drainage of the dominant left retroperitoneal abscess following drain replacement recently. However, multiple regional daughter abscesses remain, including the largest which is 3.7 cm in the lesser sac. 2. Persistent small bowel obstruction, with transition point abutting the dominant draining abscess. Small bowel dilatation perhaps slightly regressed in the last week. 3. Secondary inflammation of the descending colon near the dominant draining abscess. New 9 cm segment of thick walled distal transverse colon where sub serosal involvement by abscess is difficult to exclude (series 2, image 30). 4. Stable layering left pleural effusion and minor lung base atelectasis. Stable small perihepatic and pelvic free fluid.   Electronically Signed   By: Genevie Ann M.D.   On: 10/19/2014 11:31    Procedures None  Hospital Course:  69 year old male with a history of left retroperitoneal liposarcoma s/p resection and left nephrectomy 07/13/14 with positive margins by Dr. Barry Dienes s/p radiation last being on 7/5. This was complicated by an abscess which was managed with a drain which was removed 09/26/14. The patient states he was in his  usual state of health until Tuesday 10/10/14 afternoon at which time he developed nausea, vomiting and bloating. Symptoms are intermittent. Moderate in severity. Denies any significant abdominal pain. Denies fever, chills or sweats. He is having bowel movements and passed flatus during my exam. Oral intake has been poor. Work up shows a normal white count. Na 128. K 3. sCr 1.31. H&h low but stable. CT of abdomen and pelvis an increase of the left retroperitoneal abscess with multiple smaller abscesses which communicate with the larger one and a small bowel obstruction with transition zone in the left upper abdomen. We have therefore been asked to evaluate.   He has remained stable overnight. NGT with 1200 bilious output. Passing flatus, but nauseated and hiccoughs. Apparently he developed hiccoughs after his last radiation treatment and was seen by his PCP and was given thorazine which have helped. He has been taking reglan and zofran before meals for nausea.   Workup showed intra-abdominal abscesses and SBO presumably secondary to compression of the fluid collections vs adhesions.  He was found to have significant symptomatic anemia and was transfused 2 pRBC on 10/12/14.  Patient was admitted and was transferred to the floor for conservative management of his pSBO.  He was managed initially on Vancomycin and Zosyn, but was switched to Invanz at the recommendations of ID.  We consulted IR and they successfully placed a perc drain on 10/12/14 into one of the reachable abscesses.  The other abscess collections were not amenable to perc drainage.  This was also re-confirmed on a repeat CT scan done on 10/19/14 which showed improvement in dominant left RP abscess, but multiple additional abscesses remain including one in the lesser sac.  His SBO soon resolved and NG tube was discontinued on 10/16/14.  Diet was  advanced as tolerated.  He began to have diarrhea, he was started on a probiotic.  He was found  to have low Iron and was started on Iron supplementation.  However this made him nauseous and he intermittently was compliant with this in the hospital.    On HD #9, the patient was voiding well, tolerating diet, ambulating well, pain well controlled, vital signs stable, scars well healed and felt stable for discharge home.  Patient will follow up in our office in 2-3 weeks with Dr. Barry Dienes and knows to call with questions or concerns.  He will maintain his perc drain for now given residual abscesses on repeat CT but output has improved and is only serous.  He will follow up with ID as an outpatient in 2-3 weeks and Dr. Johnnye Sima would recommend repeat CT at that time.  He has been instructed to follow up with his PCP for iron deficiency anemia.  He will be discharged home with [place] for drain management and IV antibiotics.  He will follow up in drain clinic in 2-3 weeks.      Medication List    STOP taking these medications        oxyCODONE-acetaminophen 5-325 MG per tablet  Commonly known as:  PERCOCET/ROXICET      TAKE these medications        ertapenem 1 g in sodium chloride 0.9 % 50 mL  Inject 1 g into the vein daily after supper.     ferrous sulfate 325 (65 FE) MG tablet  Take 1 tablet (325 mg total) by mouth 2 (two) times daily with a meal.     HYDROcodone-acetaminophen 5-325 MG per tablet  Commonly known as:  NORCO/VICODIN  Take 1-2 tablets by mouth every 6 (six) hours as needed for moderate pain or severe pain.     LORazepam 1 MG tablet  Commonly known as:  ATIVAN  Take 1 tablet by mouth 3 (three) times daily as needed for anxiety. Anxiety/nausea     Melatonin 5 MG Caps  Take 1 capsule by mouth at bedtime.     metoCLOPramide 10 MG/10ML Soln  Commonly known as:  REGLAN  Take 10 mLs (10 mg total) by mouth 4 (four) times daily -  before meals and at bedtime.     multivitamin with minerals Tabs tablet  Take 1 tablet by mouth every morning.     ondansetron 8 MG  disintegrating tablet  Commonly known as:  ZOFRAN ODT  Take 1 tablet (8 mg total) by mouth every 8 (eight) hours as needed for nausea or vomiting.     pantoprazole 40 MG tablet  Commonly known as:  PROTONIX  Take 1 tablet (40 mg total) by mouth daily.     saccharomyces boulardii 250 MG capsule  Commonly known as:  FLORASTOR  Take 1 capsule (250 mg total) by mouth 2 (two) times daily.     traZODone 50 MG tablet  Commonly known as:  DESYREL  Take 50 mg by mouth at bedtime.         Follow-up Information    Follow up with Gennette Pac, MD. Schedule an appointment as soon as possible for a visit in 2 weeks.   Specialty:  Family Medicine   Why:  For post-hospital follow up regarding your anemia   Contact information:   [place] [address] [phone]       Follow up with [facility], MD. Call in 2 weeks.   Specialty:  General Surgery   Why:  For post-hospital follow up with Dr. Barry Dienes in 2-3 weeks   Contact information:   [place] [address] [phone]       Follow up with [name], MD. Schedule an appointment as soon as possible for a visit in 2 weeks.   Specialty:  Infectious Diseases   Why:  For post-hospital follow up.  See him within 2-3 weeks regarding your IV antibiotics.   Contact information:   [place] [address] [phone]       Signed: Nat Christen, [facility] [phone]  10/20/2014, 2:21 PM

## 2014-10-20 NOTE — Progress Notes (Signed)
IR will call the patient to setup drain clinic appt after discharge for the week of August 1st with repeat imaging.  Tsosie Billing PA-C Interventional Radiology  10/20/14  1:19 PM

## 2014-10-20 NOTE — Progress Notes (Addendum)
Went over all dishcharge information with patient and family.  All questions answered.  HH following up with pt.  Prescriptions and AVS summary given.  Zofran prescription did not print, pt said he had the same medication at home and did not want to wait for prescription to print.  PICC line flushed and capped for home.  Pt wheeled out by NT.

## 2014-10-23 ENCOUNTER — Other Ambulatory Visit: Payer: Self-pay | Admitting: General Surgery

## 2014-10-23 ENCOUNTER — Other Ambulatory Visit (HOSPITAL_COMMUNITY): Payer: Self-pay | Admitting: Interventional Radiology

## 2014-10-23 DIAGNOSIS — K6819 Other retroperitoneal abscess: Secondary | ICD-10-CM

## 2014-10-26 ENCOUNTER — Encounter: Payer: Self-pay | Admitting: Radiation Oncology

## 2014-10-26 ENCOUNTER — Ambulatory Visit
Admission: RE | Admit: 2014-10-26 | Discharge: 2014-10-26 | Disposition: A | Discharge status: Home / Self Care | Payer: Medicare Other | Source: Ambulatory Visit | Attending: Radiation Oncology | Admitting: Radiation Oncology

## 2014-10-26 VITALS — BP 92/58 | HR 112 | Temp 98.3°F | Ht 68.0 in | Wt 163.7 lb

## 2014-10-26 DIAGNOSIS — C480 Malignant neoplasm of retroperitoneum: Secondary | ICD-10-CM

## 2014-10-26 MED ORDER — CHLORPROMAZINE HCL 25 MG PO TABS: 25.0000 mg | ORAL_TABLET | Freq: Three times a day (TID) | ORAL | Status: DC | PRN

## 2014-10-26 NOTE — Progress Notes (Addendum)
Mr. 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  He reports a tight sensation which makes him uncomfortable in the upper abdominal region.  Reports he eats, "some" and note an ~ 11 lb weight loss since 10/12/14.  Mr. Darryl Griffith reports hiccups but discovered per his pharmacy that he cannot take what was prescribed to relieve hiccups since there is a drug interaction with his Reglan.  He has now stopped rSeglan since Tuesday of this week and is seeking relief.  He also reports that his nausea is unchanged since treatment.  Sitting 99/69 and Pulse 100   BP 92/58 mmHg  Pulse 112  Temp(Src) 98.3 F (36.8 C)  Ht 5\' 8"  (1.727 m)  Wt 163 lb 11.2 oz (74.254 kg)  BMI 24.90 kg/m2 Standing  Wt Readings from Last 3 Encounters:  10/26/14 163 lb 11.2 oz (74.254 kg)  10/12/14 174 lb 1.6 oz (78.971 kg)  09/28/14 174 lb 8 oz (79.153 kg)    .

## 2014-10-26 NOTE — Progress Notes (Signed)
Radiation Oncology         [phone] ________________________________  Name: Darryl Griffith MRN: [id number]  Date: 10/26/2014  DOB: 1945/08/30  Follow-Up Visit Note  CC: Gennette Pac, MD  Wyatt Portela, MD  Diagnosis: Darryl Griffith is a 69 year old gentleman presenting to clinic in regards ot his malignant neoplasm of retroperitoneum.  Interval Since Last Radiation:  3  weeks   Narrative:  The patient returns today for routine follow-up. He was concerned about a "tight sensation" located in the upper abdominal region. An 11 pound weight loss is noted since 10/12/2014. This severe weight loss was due to his recent hospitalization, taking place from 10/11/2014-10/20/2014 in which a CT scan was completed. The patient reports hiccups and was prescribed thorazine to relieve hiccups but this was not filled since there is a drug interaction with his Reglan.  He has since stopped reglan.  "Yesterday, I had hiccups for two hours before they stopped." He has stopped reglan since Tuesday, 10/24/2014.  He also reports that his nausea is unchanged since treatment. Another major issue expressed in which the patient was troubled by, "When I sit down, I go to cough. Then I cough and I throw up. It's liquidy, around an orange color. I let my stomach settle and then continue eating." The patient also reports that his stool is looser and without shape, as of late. He is attempting to return to a "full" and normal diet. Due to his lower blood pressure, elevated resting heart rate, and dizziness upon standing, intravenous fluid infusions were recommended. The patient is very cold during today's appointment and was provided with a warm blanket. He was accompanied by his wife. He stated that his energy levels are returning, but continues to be frustrated with nausea, vomiting, and current obstacles he is facing with his diet due to the nausea and vomiting. The patient is currently taking antibiotics at home to  treat internal abscesses.                        ALLERGIES:  is allergic to compazine.  Meds: Current Outpatient Prescriptions  Medication Sig Dispense Refill  . Ferrous Sulfate (HM IRON SLOW RELEASE) 142 (45 FE) MG TBCR Take 1 tablet by mouth daily.    Marland Kitchen LORazepam (ATIVAN) 1 MG tablet Take 1 tablet by mouth 3 (three) times daily as needed for anxiety. Anxiety/nausea  0  . Melatonin 5 MG CAPS Take 1 capsule by mouth at bedtime.     . Multiple Vitamin (MULTIVITAMIN WITH MINERALS) TABS tablet Take 1 tablet by mouth every morning.     . ondansetron (ZOFRAN ODT) 8 MG disintegrating tablet Take 1 tablet (8 mg total) by mouth every 8 (eight) hours as needed for nausea or vomiting. 12 tablet 0  . pantoprazole (PROTONIX) 40 MG tablet Take 1 tablet (40 mg total) by mouth daily. 60 tablet 0  . traZODone (DESYREL) 50 MG tablet Take 50 mg by mouth at bedtime.     . ertapenem 1 g in sodium chloride 0.9 % 50 mL Inject 1 g into the vein daily after supper. 21 Dose 0  . metoCLOPramide (REGLAN) 10 MG/10ML SOLN Take 10 mLs (10 mg total) by mouth 4 (four) times daily -  before meals and at bedtime. (Patient not taking: Reported on 10/26/2014) 473 mL 5   No current facility-administered medications for this encounter.    Physical Findings: The atient is alert and oriented. Changes in blood  pressure and weight are noted in the HPI.  height is 5' 8" (1.727 m) and weight is 163 lb 11.2 oz (74.254 kg). His temperature is 98.3 F (36.8 C). His blood pressure is 92/58 and his pulse is 112.   Pale.  Lab Findings: Lab Results  Component Value Date   WBC 5.4 10/20/2014   WBC 6.4 09/18/2014   HGB 8.5* 10/20/2014   HGB 6.7* 09/18/2014   HCT 26.5* 10/20/2014   HCT 20.4* 09/18/2014   PLT 483* 10/20/2014   PLT 301 09/18/2014    Lab Results  Component Value Date   NA 138 10/20/2014   NA 131* 09/18/2014   K 3.8 10/20/2014   K 4.4 09/18/2014   CHLORIDE 98 09/18/2014   CO2 25 10/20/2014   CO2 26 09/18/2014     GLUCOSE 99 10/20/2014   GLUCOSE 159* 09/18/2014   BUN 9 10/20/2014   BUN 38.6* 09/18/2014   CREATININE 1.11 10/20/2014   CREATININE 1.0 09/18/2014   BILITOT 0.5 10/11/2014   BILITOT <0.20 09/18/2014   ALKPHOS 101 10/11/2014   ALKPHOS 65 09/18/2014   AST 24 10/11/2014   AST 17 09/18/2014   ALT 22 10/11/2014   ALT 12 09/18/2014   PROT 6.6 10/11/2014   PROT 5.3* 09/18/2014   ALBUMIN 2.4* 10/11/2014   ALBUMIN 2.4* 09/18/2014   CALCIUM 8.0* 10/20/2014   CALCIUM 8.8 09/18/2014   ANIONGAP 6 10/20/2014   ANIONGAP 6 09/18/2014    Radiographic Findings: Ct Abdomen Pelvis W Contrast  10/19/2014   CLINICAL DATA:  69 year-old male who underwent a left nephrectomyand 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 Percutaneous drain placed on 08/15/2014 , and drainage catheter removed on 09/26/2014. Recent presentation with nausea, vomiting and bloating and increased left retroperitoneal abscess with multiple smaller abscesses which communicate with the larger one and a small bowel obstruction with transition zone in the left upper abdomen. Status post repeat percutaneous left abdominal abscess drain placement on 10/12/2014.  Subsequent encounter.  EXAM: CT ABDOMEN AND PELVIS WITH CONTRAST  TECHNIQUE: Multidetector CT imaging of the abdomen and pelvis was performed using the standard protocol following bolus administration of intravenous contrast.  CONTRAST:  42m OMNIPAQUE IOHEXOL 300 MG/ML  SOLN  COMPARISON:  Abdominal radiographs 10/16/2014 and earlier. CT drain placement 10/12/2014. CT Abdomen and Pelvis 10/12/2014, and earlier  FINDINGS: Stable small left pleural effusion. No pericardial effusion. Stable mild bibasilar atelectasis.  Stable visualized osseous structures.  Stable trace pelvic free fluid. Negative rectum. Negative urinary bladder.  Stable an mildly indistinct appearance of the sigmoid colon with underlying chronic  diverticulosis.  The left colon now is decompressed. A portion of the left colon passes in proximity to the left retroperitoneal abscess and drain and remains moderately thickened. The splenic flexure has a more normal appearance. However, the distal transverse colon in the left upper quadrant now appears abnormally thickened, an the wall appears edematous. The dependent wall appears more hypodense (series 2, image 30), and this is in proximity to 1 of the residual abscess components near the lesser sac. A segment of about 9 cm is affected. See also coronal image 16.  Upstream of that segment of the transverse colon and right colon appear normal. The terminal ileum is decompressed. There are multiple decompressed small bowel loops in the pelvis. There are multiple decompressed small bowel loops in the left lateral abdomen. There are upstream dilated small bowel loops with air and fluid levels, and once again  the transition point appears to be the site of the left retroperitoneal abscess on series 2, image 37. Small bowel dilatation has slightly regressed  Since 10/12/2014. The stomach appears stable, including a dominant 3.7 cm lesser sac abscess (series 2, image 26), with evidence of smaller gastrosplenic ligament abscesses (image 22). The dominant abscess in the lesser sac no longer contains gas. The pancreas is abutted by the lesser sac abscess but otherwise enhances normally. No pancreatic ductal dilatation. The liver, gallbladder, right adrenal gland, and right kidney appear stable and within normal limits. Right renal contrast excretion an the right ureter appear within normal limits.  Posterior approach percutaneous retroperitoneal abscess drain remains in place with interval drainage of the dominant abscess (which measured up to 7.2 cm recently). Small volume residual left abscess adjacent to the left psoas muscle (series 2, image 38). Small volume residual gas. The spleen is abutted by these changes and  appears stable. The left kidney is surgically absent, with numerous surgical clips also in the region.  The portal venous system appears patent. Aortoiliac calcified atherosclerosis noted. Major arterial structures in the abdomen and pelvis are patent. Trace abdominal free fluid adjacent to the liver is stable. No pneumoperitoneum.  IMPRESSION: 1. Satisfactory drainage of the dominant left retroperitoneal abscess following drain replacement recently. However, multiple regional daughter abscesses remain, including the largest which is 3.7 cm in the lesser sac. 2. Persistent small bowel obstruction, with transition point abutting the dominant draining abscess. Small bowel dilatation perhaps slightly regressed in the last week. 3. Secondary inflammation of the descending colon near the dominant draining abscess. New 9 cm segment of thick walled distal transverse colon where sub serosal involvement by abscess is difficult to exclude (series 2, image 30). 4. Stable layering left pleural effusion and minor lung base atelectasis. Stable small perihepatic and pelvic free fluid.   Electronically Signed   By: Genevie Ann M.D.   On: 10/19/2014 11:31   Ct Abdomen Pelvis W Contrast  10/12/2014   CLINICAL DATA:  69 year old male with nausea and vomiting. History of bowel obstruction history of left retroperitoneal liposarcoma status post resection  EXAM: CT ABDOMEN AND PELVIS WITH CONTRAST  TECHNIQUE: Multidetector CT imaging of the abdomen and pelvis was performed using the standard protocol following bolus administration of intravenous contrast.  CONTRAST:  35m OMNIPAQUE IOHEXOL 300 MG/ML  SOLN  COMPARISON:  CT dated 08/29/2014  FINDINGS: Partially visualized small left pleural effusion. There is atelectatic changes of the left lung base. Pneumonia is not excluded. No intra-abdominal free air. There is diffuse mesenteric stranding and small perihepatic and right lower quadrant free fluid.  Subcentimeter hepatic hypodense lesion  is too small to characterize. The liver is otherwise unremarkable. The gallbladder, pancreas, spleen, right adrenal gland appear unremarkable. Stable right renal hypodense lesion, likely a cyst. There is no hydronephrosis on the right. The visualized right ureter and urinary bladder as well as the prostate and seminal vesicles are grossly unremarkable.  The left adrenal gland is not visualized. Postsurgical changes of left nephrectomy. There is a 7.2 x 4.0 cm complex fluid collection containing small pockets of air in the left retroperitoneum and along the left psoas muscle compatible with an abscess. There has been interval removal of the percutaneous drainage catheter with interval increase in size of this collection compared to the prior study. Multiple loculated fluid noted along the greater curvature of the stomach and superior to the pancreas compatible with multiloculated abscesses. There has been interval increase in the  size of this abscess is compared to the prior study. The largest pocket measures approximately 4.3 x 2.6 cm (series 6, image 8). A tract like structure containing pockets of air noted extending from the abscesses along the stomach wall to the larger abscess in the left retroperitoneum along the psoas muscle.  An enteric tube is noted with tip in the stomach. There is dilatation of loops of proximal and mid small bowel. The distal small bowel and terminal ileum are collapsed. The transition zone is noted in the left upper abdomen (coronal series 5, image 48 and axial series 2, image 33) adjacent to the retroperitoneal abscess and surgical clips. This may be related to surrounding inflammation or adhesion. There is extensive colonic diverticulosis. There is focal inflammation and thickening of the upper descending colon adjacent to the retroperitoneal abscess, likely secondary to adjacent inflammation. The appendix appears unremarkable.  There is aortoiliac atherosclerotic disease. No portal  venous gas. Midline vertical anterior abdominal wall incisional scar. Degenerative changes of the spine. No acute fracture.  IMPRESSION: Interval removal of the left posterior percutaneous drainage catheter with interval increase in the size of the left retroperitoneal abscess. Multiple smaller abscesses noted along the posterior wall and greater curvature of the stomach which also have increased in size compared to the prior study. These collections appear to communicate with the larger abscess in the left retroperitoneum along the psoas muscle.  Small-bowel obstruction with transition zone in the left upper abdomen adjacent to the retroperitoneal collection. The cause of the obstruction may be related to adjacent inflammation or adhesions.  Small left pleural effusion with partial left lower lobe atelectasis/pneumonia.   Electronically Signed   By: Anner Crete M.D.   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 Postoperative course complicated by development of a fluid collection within the left nephrectomy spot retroperitoneal resection bed.  Patient initially underwent CT-guided percutaneous drainage catheter placement on 08/01/2014.  Subsequent CT imaging in fluoroscopic guided drainage catheter injection demonstrates an improved but residual left-sided retroperitoneal fluid collection.  Patient returns to the Interventional Radiology Clinic today for fluoroscopic evaluation of existing percutaneous drainage catheter.  Patient reports at there has been little to no output from the percutaneous drain during the past 2-3 days. Prior to this, the patient reports a steady decrease in output since the patient was last seen in interventional radiology drain Clinic on 08/29/2014.  EXAM: SINUS TRACT INJECTION/FISTULOGRAM  COMPARISON:  CT  abdomen pelvis - 08/29/2014; 08/15/2014; fluoroscopic guided percutaneous drainage catheter injection - 08/29/2014; 08/15/2014  CONTRAST:  40m OMNIPAQUE IOHEXOL 300 MG/ML SOLN - administered via the existing left-sided retroperitoneal percutaneous drainage catheter  FLUOROSCOPY TIME:  18 seconds  TECHNIQUE: The patient was positioned supine on the fluoroscopy table.  A preprocedural spot fluoroscopic image was obtained of the left upper abdominal quadrant and the existing percutaneous drainage catheter.  Multiple spot fluoroscopic and radiographic images were obtained following the injection of a small amount of contrast via the percutaneous drainage catheter.  Images were reviewed and discussed with referring physician Dr. BBarry Dienes and the decision was made to remove the percutaneous drainage catheter.  As such, the external portion of the percutaneous drainage catheter was cut and the percutaneous drain was removed intact. A dressing was placed. The patient tolerated the procedure well without immediate postprocedural complication.  FINDINGS: Preprocedural spot fluoroscopic image  demonstrates unchanged positioning of the percutaneous drainage catheter with end coiled and locked within the medial aspect the left retroperitoneum adjacent to multiple surgical clips.  Contrast injection demonstrates opacification of a resolved fluid collection within the left retroperitoneum with contrast opacification along the percutaneous drainage catheter tract to the level the skin surface.  There is no definitive fistulous connection with any adjacent organ.  IMPRESSION: Technically successful resolution of postoperative retroperitoneal fluid collection following CT-guided percutaneous drainage catheter placement. No evidence of fistulous connection to any adjacent organ.  Above was discussed with referring physician, Dr. Barry Dienes, and the percutaneous drainage catheter was removed.   Electronically Signed   By: Sandi Mariscal M.D.    On: 09/26/2014 16:38   Ir Fluoro Guide Cv Line Right  10/12/2014   INDICATION: Retroperitoneal Abscess: Need for Central Venous Access for Prolonged Intravenous Antibiotics  EXAM: ULTRASOUND AND FLUOROSCOPIC GUIDED PICC LINE INSERTION  MEDICATIONS: None.  CONTRAST:  None  FLUOROSCOPY TIME:  6 seconds.  COMPLICATIONS: None immediate  TECHNIQUE: The procedure, risks, benefits, and alternatives were explained to the patient and informed written consent was obtained. A timeout was performed prior to the initiation of the procedure.  The right upper extremity was prepped with chlorhexidine in a sterile fashion, and a sterile drape was applied covering the operative field. Maximum barrier sterile technique with sterile gowns and gloves were used for the procedure. A timeout was performed prior to the initiation of the procedure. Local anesthesia was provided with 1% lidocaine.  Under direct ultrasound guidance, the right brachial vein was accessed with a micropuncture kit after the overlying soft tissues were anesthetized with 1% lidocaine. An ultrasound image was saved for documentation purposes. A guidewire was advanced to the level of the superior caval-atrial junction for measurement purposes and the PICC line was cut to length. A peel-away sheath was placed and a 39 cm, 5 [place], dual lumen was inserted to level of the superior caval-atrial junction. A post procedure spot fluoroscopic was obtained. The catheter easily aspirated and flushed and was sutured in place. A dressing was placed. The patient tolerated the procedure well without immediate post procedural complication.  FINDINGS: After catheter placement, the tip lies within the superior cavoatrial junction. The catheter aspirates and flushes normally and is ready for immediate use.  IMPRESSION: Successful ultrasound and fluoroscopic guided placement of a right brachial vein approach, 39 cm, 5 French, dual lumen PICC with tip at the superior caval-atrial  junction. The PICC line is ready for immediate use.  Read By:  Gareth Eagle, PA-C   Electronically Signed   By: Jacqulynn Cadet M.D.   On: 10/12/2014 13:50   Ir US Guide Vasc Access Right  10/12/2014   INDICATION: Retroperitoneal Abscess: Need for Central Venous Access for Prolonged Intravenous Antibiotics  EXAM: ULTRASOUND AND FLUOROSCOPIC GUIDED PICC LINE INSERTION  MEDICATIONS: None.  CONTRAST:  None  FLUOROSCOPY TIME:  6 seconds.  COMPLICATIONS: None immediate  TECHNIQUE: The procedure, risks, benefits, and alternatives were explained to the patient and informed written consent was obtained. A timeout was performed prior to the initiation of the procedure.  The right upper extremity was prepped with chlorhexidine in a sterile fashion, and a sterile drape was applied covering the operative field. Maximum barrier sterile technique with sterile gowns and gloves were used for the procedure. A timeout was performed prior to the initiation of the procedure. Local anesthesia was provided with 1% lidocaine.  Under direct ultrasound guidance, the right brachial vein was accessed  with a micropuncture kit after the overlying soft tissues were anesthetized with 1% lidocaine. An ultrasound image was saved for documentation purposes. A guidewire was advanced to the level of the superior caval-atrial junction for measurement purposes and the PICC line was cut to length. A peel-away sheath was placed and a 39 cm, 5 [place], dual lumen was inserted to level of the superior caval-atrial junction. A post procedure spot fluoroscopic was obtained. The catheter easily aspirated and flushed and was sutured in place. A dressing was placed. The patient tolerated the procedure well without immediate post procedural complication.  FINDINGS: After catheter placement, the tip lies within the superior cavoatrial junction. The catheter aspirates and flushes normally and is ready for immediate use.  IMPRESSION: Successful ultrasound and  fluoroscopic guided placement of a right brachial vein approach, 39 cm, 5 French, dual lumen PICC with tip at the superior caval-atrial junction. The PICC line is ready for immediate use.  Read By:  Gareth Eagle, PA-C   Electronically Signed   By: Jacqulynn Cadet M.D.   On: 10/12/2014 13:50   Dg Abd 2 Views  10/16/2014   CLINICAL DATA:  Persistent abdominal pain and distention  EXAM: ABDOMEN - 2 VIEW  COMPARISON:  October 15, 2014  FINDINGS: Supine and upright images obtained. Compared to 1 day prior, there is no longer appreciable small bowel dilatation. Multiple air-fluid levels, however, do remain. No free air. Nasogastric tube tip and side port in stomach. Drain in the left abdomen remains. Multiple surgical clips are noted on the left, stable.  IMPRESSION: Resolution of bowel dilatation. Multiple air-fluid levels remain. Nasogastric tube tip and side port in stomach. Suspect partial but incomplete resolution of bowel obstruction. No free air.   Electronically Signed   By: Lowella Grip III M.D.   On: 10/16/2014 07:29   Dg Abd 2 Views  10/15/2014   CLINICAL DATA:  Nasogastric tube in place there follow-up small bowel obstruction. Patient reports back pain when moving. History of left kidney drain.  EXAM: ABDOMEN - 2 VIEW  COMPARISON:  10/14/2014 and earlier  FINDINGS: Nasogastric tube is in place with tip overlying the region of the stomach. There is dense opacity in the left lower lobe of the lung consistent with atelectasis, consolidation and pleural effusion.  Surgical clips and drain are identified in the left upper quadrant as before.  There is moderate dilatation of small bowel loops which contain air-fluid levels on the erect view. The degree of dilatation is similar compared prior studies. However fewer bowel loops appear dilated compared with prior studies. There has been interval evacuation of some of the oral contrast from the colon since prior study.  IMPRESSION: 1. Persistent small bowel  dilatation. 2. Interval evacuation of some of the contrast from the colon. 3. Left lower lobe atelectasis/consolidation and effusion.   Electronically Signed   By: Nolon Nations M.D.   On: 10/15/2014 09:10   Dg Abd Acute W/chest  10/12/2014   CLINICAL DATA:  Nausea and vomiting since yesterday. 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 Nonsmoker.  EXAM: DG ABDOMEN ACUTE W/ 1V CHEST  COMPARISON:  CT abdomen and pelvis 08/29/2014. Abdominal series [id number].  FINDINGS: Blunting of the left costophrenic angle without change since previous study, likely representing pleural thickening. Normal heart size and pulmonary vascularity. No focal airspace disease or consolidation in the lungs. No pneumothorax. Mediastinal contours appear intact.  Surgical clips in the left upper quadrant. Interval development of prominent gaseous distention  of mid abdominal small bowel with paucity of gas in the colon. Multiple air-fluid levels. Findings suggest small bowel obstruction. No free intra-abdominal air. No radiopaque stones. Degenerative changes in the spine and hips.  IMPRESSION: Gaseous distention of small bowel with air-fluid levels consistent with small bowel obstruction.   Electronically Signed   By: Lucienne Capers M.D.   On: 10/12/2014 00:27   Dg Abd Portable 1v  10/14/2014   CLINICAL DATA:  Small-bowel obstruction. History of left retroperitoneal mass, retroperitoneal fluid collection, resection of retroperitoneal mass.  EXAM: PORTABLE ABDOMEN - 1 VIEW  COMPARISON:  [id number] I  FINDINGS: There is residual contrast in the colon following recent CT exam. A left-sided percutaneous drain remains in place in the regional numerous surgical clips. There is dilatation of small bowel loops in the central abdomen. There has been slight interval improvement in dilatation since previous exam.  IMPRESSION: 1. Persistent small bowel obstruction showing slight interval decrease in distension of small bowel  loops. 2. Persistent left upper quadrant drain. 3. Residual contrast in the colon.   Electronically Signed   By: Nolon Nations M.D.   On: 10/14/2014 07:26   Dg Abd Portable 1v  10/13/2014   CLINICAL DATA:  Followup small bowel obstruction. Abdominal abscess. Retroperitoneal liposarcoma.  EXAM: PORTABLE ABDOMEN - 1 VIEW  COMPARISON:  10/11/2014  FINDINGS: A new nasogastric tube is seen with tip in the proximal stomach. Decreased small bowel dilatation is seen compared to previous study. Oral contrast from recent CT is now seen in the colon.  A new percutaneous drainage catheter is seen in the left upper quadrant. Multiple surgical clips again seen in left quadrant.  IMPRESSION: Decreased small bowel dilatation since prior exam. Nasogastric tube in appropriate position.   Electronically Signed   By: Earle Gell M.D.   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 Barry Dienes. This was complicated by development of a retroperitoneal abscess. Dr. Pascal Lux placed a drain on 08/15/2014. Patient then underwent a drain/fistula check by Dr. Pascal Lux on 09/26/2014. There was no evidence of fistulous connection to any adjacent organ and the drainage catheter was removed.He had been doing well until Tuesday afternoon when he developed nausea, vomiting and bloating. CT shows an increase of the left retroperitoneal abscess with multiple smaller abscesses which communicate with the larger one and a small bowel obstruction with transition zone in the left upper abdomen. Patient presents for replacement of percutaneous drainage catheter.  EXAM: CT IMAGE GUIDED DRAINAGE BY PERCUTANEOUS CATHETER  Date: 10/12/2014  PROCEDURE: 1. Placement of a 12 French drainage catheter modified with additional sideholes into the left retroperitoneal abscess Interventional Radiologist:   Criselda Peaches, MD  ANESTHESIA/SEDATION: Moderate (conscious) sedation was used. 4 mg Versed, 50 mcg Fentanyl were administered intravenously. The patient's vital signs were monitored continuously by radiology nursing throughout the procedure.  Sedation Time: 33 minutes  MEDICATIONS: None additional  TECHNIQUE: Informed consent was obtained from the patient following explanation of the procedure, risks, benefits and alternatives. The patient understands, agrees and consents for the procedure. All questions were addressed. A time out was performed.  A planning axial CT scan was performed. The complex fluid and gas collection in the left retroperitoneal space was successfully identified. A suitable entry site just medial and inferior to the left twelfth rib was selected. The skin  was marked. The region was then sterilely prepped and draped in standard fashion using Betadine skin prep. Local anesthesia was attained by infiltration with 1% lidocaine. A small dermatotomy was made. Under intermittent CT fluoroscopic guidance, an 18 gauge trocar needle was advanced into the inferior aspect of the left retroperitoneal fluid collection. A short Amplatz wire was then advanced cranially within the fluid collection. The skin tract was then dilated and a Cook 12 [place] all-purpose drainage catheter modified with additional sideholes was advanced over the wire and formed within the abscess cavity. Aspiration yields approximately 100 mL thick, purulent material.  Post aspiration CT imaging demonstrates resolution of fluid, however there is a persistent thick walled cavity containing air which has not collapsed around the catheter. The catheter was secured to the skin with 0 Prolene suture and connected to JP bulb suction.  COMPLICATIONS: None  Estimated blood loss: 0  IMPRESSION: 1. Successful placement of a 12 French drainage catheter modified with additional sideholes into the recurrent left retroperitoneal abscess cavity.  Aspiration yields 100 mL thick, purulent material. Samples were sent for culture, anaerobic culture and fungal culture. 2. Following aspiration, the thick walled cavity does not collapse and remains present as an air containing potential space.  Signed,  Criselda Peaches, MD  [facility]  [facility]   Electronically Signed   By: Jacqulynn Cadet M.D.   On: 10/12/2014 17:02    Impression: Darryl Griffith is a 69 year old gentleman s/p radiotherapy for sarcoma of the retroperitoneum. 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  Plan: All questions vocalized by the patient and his wife were fully addressed. Common symptoms to expect at this time in the patient's recovery were discussed and reviewed. Healthy methods to manage these symptoms if they are to occur were addressed. He and his wife are aware of his follow up appointment to take place with radiation oncology. Medication options were recommended to address his hiccups and dietary suggestions were vocalized to address his developing symptom of loose stool. Education as to why symptoms of nausea and vomiting are persisting was addressed. These symptoms are of concern and should be monitored, moving forward. If these symptoms persist without improvement or worsen further tests are to completed to screen for chronic inflammation issues or active cancer. Now that his Reglan is stopped, I prescribed Thorazine in management of his hiccupes, to be administered as needed. Side effects of this medication was fully . Intravenous fluid infusions will be ordered through home health for orthostatic hypotension. The patient expressed that he and his wife are relocating at the end of August 2016 to [place], [place]. Re-imaging of the abdomen is scheduled. The importance in  management of nutrition during this stage in recovery was heavily emphasized. The patient is aware of his CT scan to take place next Tuesday, 10/31/2014 and aware of his follow up appointment to take place in mid-August with radiation oncology. If he develops any further questions or concerns in regards to his treatment and recovery, he has been encouraged to contact Dr. Tammi Klippel, MD.   This document serves as a record of services personally performed by Tyler Pita, MD. It was created on his behalf by Lenn Cal, a trained medical scribe. The creation of this record is based on the scribe's personal observations and the provider's statements to them. This document has been checked  and approved by the attending provider.  _____________________________________  [name], M.D.

## 2014-10-31 ENCOUNTER — Ambulatory Visit
Admission: RE | Admit: 2014-10-31 | Discharge: 2014-10-31 | Disposition: A | Discharge status: Home / Self Care | Payer: Medicare Other | Source: Ambulatory Visit | Attending: General Surgery | Admitting: General Surgery

## 2014-10-31 DIAGNOSIS — K6819 Other retroperitoneal abscess: Secondary | ICD-10-CM | POA: Insufficient documentation

## 2014-10-31 MED ORDER — IOPAMIDOL (ISOVUE-300) INJECTION 61%
100.0000 mL | Freq: Once | INTRAVENOUS | Status: AC | PRN
  Administered 2014-10-31: 100 mL via INTRAVENOUS

## 2014-10-31 NOTE — Progress Notes (Signed)
Chief Complaint: Patient was seen in consultation today for No chief complaint on file.  at the request of Hoxworth,Benjamin  Referring Physician(s): Hoxworth,Benjamin  History of Present Illness: Darryl Griffith is a 69 y.o. 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 Barry Dienes. This was complicated by development of a retroperitoneal abscess. Dr. Pascal Lux placed a drain on 08/15/2014. Patient then underwent a drain/fistula check by Dr. Pascal Lux on 09/26/2014. There was no evidence of fistulous connection to any adjacent organ and the drainage catheter was removed. Unfortunately, he developed recurrent left retroperitoneal abscess with multiple smaller daughter abscesses around the gastroesophageal junction and a lesser curvature of the stomach.  He underwent repeat placement of a drainage catheter with CT guidance on 10/12/2014. Since that time, he has been on intravenous antibiotics. He presents today for drain evaluation.  Clinically, he is feeling better. His drain output has been minimal over the past 4 days. He injects approximately 10 mL sterile saline daily and output is usually 20 mL of yellow serous fluid. No definite purulent foul smelling output.  He continues on intravenous antibiotics. CT imaging today demonstrates significant improvement in the main body of the left retroperitoneal abscess. The daughter abscesses are also improving.  Fluoroscopic injection demonstrates no fistulous connection with large or small bowel. The cleft abscess cavity is minimal.  Past Medical History  Diagnosis Date  . Hypertension   . Elevated cholesterol   . Anginal pain     hx of CP saw Dr Wynonia Lawman 09/20/2013 H&P per chart   . Left Retroperitoneal mass s/p resection/L nephrectomy 07/13/2014   . Postoperative anemia due to acute blood loss 07/15/2014  . SBO (small bowel obstruction) 08/01/2014, 10/11/14  . Retroperitoneal fluid  collection   . 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 Surgery for arm fracture Right 1952  . Knee arthroscopy Right 1970's  . Colonoscopy with propofol N/A 10/20/2013    Procedure: COLONOSCOPY WITH PROPOFOL;  Surgeon: Lear Ng, MD;  Location: [facility];  Service: Endoscopy;  Laterality: N/A;  . Hot hemostasis N/A 10/20/2013    Procedure: HOT HEMOSTASIS (ARGON PLASMA COAGULATION/BICAP);  Surgeon: Lear Ng, MD;  Location: [facility];  Service: Endoscopy;  Laterality: N/A;  . Tigger finger release      per H&P per Dr Wynonia Lawman 09/20/2013 (on chart)  . Resection of retroperitoneal mass Left 07/13/2014    Procedure: RESECTION OF left RETROPERITONEAL MASS;  Surgeon: Stark Klein, MD;  Location: [facility];  Service: General;  Laterality: Left;  . Esophagogastroduodenoscopy (egd) with propofol N/A 09/19/2014    Procedure: ESOPHAGOGASTRODUODENOSCOPY (EGD) WITH PROPOFOL;  Surgeon: Wilford Corner, MD;  Location: [facility];  Service: Endoscopy;  Laterality: N/A;    Allergies: Compazine  Medications: Prior to Admission medications   Medication Sig Start Date End Date Taking? Authorizing Provider  chlorproMAZINE (THORAZINE) 25 MG tablet Take 1 tablet (25 mg total) by mouth 3 (three) times daily as needed. 10/26/14   Tyler Pita, MD  ertapenem 1 g in sodium chloride 0.9 % 50 mL Inject 1 g into the vein daily after supper. 10/20/14 11/10/14  Nat Christen, PA-C  Ferrous Sulfate (HM IRON SLOW RELEASE) 142 (45 FE) MG TBCR Take 1 tablet by mouth daily.    Historical Provider, MD  LORazepam (ATIVAN) 1 MG tablet Take 1  tablet by mouth 3 (three) times daily as needed for anxiety. Anxiety/nausea 09/01/14   Historical Provider, MD  Melatonin 5 MG CAPS Take 1 capsule by mouth at bedtime.  07/27/14   Historical Provider, MD  Multiple Vitamin (MULTIVITAMIN WITH  MINERALS) TABS tablet Take 1 tablet by mouth every morning.     Historical Provider, MD  ondansetron (ZOFRAN ODT) 8 MG disintegrating tablet Take 1 tablet (8 mg total) by mouth every 8 (eight) hours as needed for nausea or vomiting. 10/11/14   Jola Schmidt, MD  pantoprazole (PROTONIX) 40 MG tablet Take 1 tablet (40 mg total) by mouth daily. 09/22/14   Donne Hazel, MD  traZODone (DESYREL) 50 MG tablet Take 50 mg by mouth at bedtime.     Historical Provider, MD     Family History  Problem Relation Age of Onset  . Adopted: Yes  . Family history unknown: Yes    History   Social History  . Marital Status: Married    Spouse Name: [name]  . Number of Children: 4  . Years of Education: N/A   Occupational History  . Retired Armed forces operational officer    Social History Main Topics  . Smoking status: Never Smoker   . Smokeless tobacco: Never Used  . Alcohol Use: No  . Drug Use: No  . Sexual Activity: Not Currently   Other Topics Concern  . Not on file   Social History Narrative   Married.  Lives in [place] with wife.    Review of Systems: A 12 point ROS discussed and pertinent positives are indicated in the HPI above.  All other systems are negative.  Review of Systems  Vital Signs: There were no vitals taken for this visit.  Physical Exam  Constitutional: He appears well-developed and well-nourished. No distress.  HENT:  Head: Normocephalic and atraumatic.  Eyes: No scleral icterus.  Abdominal: Soft. He exhibits no distension. There is no tenderness.  Skin: Skin is warm and dry.     Drain entry site without evidence of cellulitis.   Nursing note and vitals reviewed.    Labs:  CBC:  Recent Labs  10/16/14 0612 10/17/14 0820 10/18/14 0300 10/20/14 0500  WBC 7.2 6.9 6.9 5.4  HGB 9.8* 9.2* 8.7* 8.5*  HCT 30.7* 28.5* 26.7* 26.5*  PLT 634* 579* 491* 483*    COAGS:  Recent Labs  07/13/14 1939  07/14/14 1055 07/14/14 1830 07/15/14 0058 07/15/14 0515  07/16/14 0700 09/18/14 1400  INR 1.42  --  1.20  --   --   --  1.21 1.06  APTT  --   < > 33 36 31 33  --  30  < > = values in this interval not displayed.  BMP:  Recent Labs  10/12/14 0845 10/13/14 0510 10/16/14 0612 10/20/14 0500  NA 132* 135 140 138  K 3.1* 3.5 4.1 3.8  CL 100* 107 110 107  CO2 23 23 25 25   GLUCOSE 102* 111* 108* 99  BUN 21* 19 10 9   CALCIUM 8.0* 7.9* 8.2* 8.0*  CREATININE 1.35* 1.27* 1.17 1.11  GFRNONAA 52* 56* >60 >60  GFRAA 60* >60 >60 >60    LIVER FUNCTION TESTS:  Recent Labs  07/05/14 1350 09/18/14 0959 10/11/14 1705  BILITOT 0.6 <0.20 0.5  AST 21 17 24   ALT 20 12 22   ALKPHOS 58 65 101  PROT 7.5 5.3* 6.6  ALBUMIN 4.2 2.4* 2.4*    TUMOR MARKERS: No results for input(s): AFPTM, CEA, CA199, CHROMGRNA  in the last 8760 hours.  Assessment and Plan:  Result left retroperitoneal abscess collection with insignificant serous drain output. Patient remains on intravenous antibiotics for at least an additional week.  By imaging, the smaller undrained daughter abscesses along the gastroesophageal junction and lesser curvature of the stomach are improving.  Drain injection under fluoroscopy demonstrates minimal collapsed abscess cavity around the drainage catheter. No fistulous connection identified.  1.) Left retroperitoneal percutaneous drainage catheter removed. 2.) Continue IV antibiotics.  Thank you for this interesting consult.  I greatly enjoyed meeting Darryl Griffith and look forward to participating in their care.  A copy of this report was sent to the requesting provider on this date.  SignedJacqulynn Cadet 10/31/2014, 4:18 PM   I spent a total of  15 Minutes in face to face in clinical consultation, greater than 50% of which was counseling/coordinating care for retroperitoneal abscess cavity with drain in place.

## 2014-11-07 ENCOUNTER — Emergency Department (HOSPITAL_COMMUNITY): Payer: Medicare Other

## 2014-11-07 ENCOUNTER — Inpatient Hospital Stay (HOSPITAL_COMMUNITY): Payer: Medicare Other

## 2014-11-07 ENCOUNTER — Encounter (HOSPITAL_COMMUNITY): Payer: Self-pay | Admitting: Emergency Medicine

## 2014-11-07 ENCOUNTER — Inpatient Hospital Stay (HOSPITAL_COMMUNITY)
Admission: EM | Admit: 2014-11-07 | Discharge: 2014-11-10 | DRG: 357 | Disposition: A | Discharge status: Home Health | Payer: Medicare Other | Attending: Internal Medicine | Admitting: Internal Medicine

## 2014-11-07 DIAGNOSIS — Z6824 Body mass index (BMI) 24.0-24.9, adult: Secondary | ICD-10-CM

## 2014-11-07 DIAGNOSIS — K651 Peritoneal abscess: Secondary | ICD-10-CM | POA: Insufficient documentation

## 2014-11-07 DIAGNOSIS — E871 Hypo-osmolality and hyponatremia: Secondary | ICD-10-CM | POA: Diagnosis present

## 2014-11-07 DIAGNOSIS — Z8589 Personal history of malignant neoplasm of other organs and systems: Secondary | ICD-10-CM

## 2014-11-07 DIAGNOSIS — E440 Moderate protein-calorie malnutrition: Secondary | ICD-10-CM | POA: Insufficient documentation

## 2014-11-07 DIAGNOSIS — I10 Essential (primary) hypertension: Secondary | ICD-10-CM | POA: Diagnosis present

## 2014-11-07 DIAGNOSIS — E780 Pure hypercholesterolemia: Secondary | ICD-10-CM | POA: Diagnosis present

## 2014-11-07 DIAGNOSIS — E8809 Other disorders of plasma-protein metabolism, not elsewhere classified: Secondary | ICD-10-CM | POA: Diagnosis present

## 2014-11-07 DIAGNOSIS — E785 Hyperlipidemia, unspecified: Secondary | ICD-10-CM | POA: Diagnosis present

## 2014-11-07 DIAGNOSIS — Z923 Personal history of irradiation: Secondary | ICD-10-CM

## 2014-11-07 DIAGNOSIS — T814XXD Infection following a procedure, subsequent encounter: Secondary | ICD-10-CM | POA: Diagnosis not present

## 2014-11-07 DIAGNOSIS — R112 Nausea with vomiting, unspecified: Secondary | ICD-10-CM | POA: Diagnosis present

## 2014-11-07 DIAGNOSIS — Z905 Acquired absence of kidney: Secondary | ICD-10-CM | POA: Diagnosis present

## 2014-11-07 DIAGNOSIS — Z792 Long term (current) use of antibiotics: Secondary | ICD-10-CM

## 2014-11-07 DIAGNOSIS — K6819 Other retroperitoneal abscess: Principal | ICD-10-CM | POA: Diagnosis present

## 2014-11-07 DIAGNOSIS — K5660 Unspecified intestinal obstruction: Secondary | ICD-10-CM | POA: Diagnosis present

## 2014-11-07 DIAGNOSIS — K5669 Other intestinal obstruction: Secondary | ICD-10-CM | POA: Diagnosis not present

## 2014-11-07 DIAGNOSIS — Z0189 Encounter for other specified special examinations: Secondary | ICD-10-CM

## 2014-11-07 DIAGNOSIS — K56609 Unspecified intestinal obstruction, unspecified as to partial versus complete obstruction: Secondary | ICD-10-CM | POA: Diagnosis present

## 2014-11-07 LAB — CBC WITH DIFFERENTIAL/PLATELET
Basophils Absolute: 0 K/uL (ref 0.0–0.1)
Basophils Relative: 0 % (ref 0–1)
Eosinophils Absolute: 0.1 K/uL (ref 0.0–0.7)
Eosinophils Relative: 1 % (ref 0–5)
HCT: 31.4 % — ABNORMAL LOW (ref 39.0–52.0)
Hemoglobin: 10.1 g/dL — ABNORMAL LOW (ref 13.0–17.0)
Lymphocytes Relative: 2 % — ABNORMAL LOW (ref 12–46)
Lymphs Abs: 0.3 K/uL — ABNORMAL LOW (ref 0.7–4.0)
MCH: 26.9 pg (ref 26.0–34.0)
MCHC: 32.2 g/dL (ref 30.0–36.0)
MCV: 83.7 fL (ref 78.0–100.0)
Monocytes Absolute: 0.9 K/uL (ref 0.1–1.0)
Monocytes Relative: 7 % (ref 3–12)
Neutro Abs: 11 K/uL — ABNORMAL HIGH (ref 1.7–7.7)
Neutrophils Relative %: 90 % — ABNORMAL HIGH (ref 43–77)
Platelets: 591 K/uL — ABNORMAL HIGH (ref 150–400)
RBC: 3.75 MIL/uL — ABNORMAL LOW (ref 4.22–5.81)
RDW: 16.6 % — ABNORMAL HIGH (ref 11.5–15.5)
WBC: 12.2 K/uL — ABNORMAL HIGH (ref 4.0–10.5)

## 2014-11-07 LAB — COMPREHENSIVE METABOLIC PANEL
ALT: 14 U/L — AB (ref 17–63)
AST: 21 U/L (ref 15–41)
Albumin: 2.9 g/dL — ABNORMAL LOW (ref 3.5–5.0)
Alkaline Phosphatase: 107 U/L (ref 38–126)
Anion gap: 12 (ref 5–15)
BUN: 16 mg/dL (ref 6–20)
CO2: 24 mmol/L (ref 22–32)
CREATININE: 1.2 mg/dL (ref 0.61–1.24)
Calcium: 9.1 mg/dL (ref 8.9–10.3)
Chloride: 96 mmol/L — ABNORMAL LOW (ref 101–111)
GFR calc non Af Amer: 60 mL/min — ABNORMAL LOW (ref >60)
Glucose, Bld: 117 mg/dL — ABNORMAL HIGH (ref 65–99)
Potassium: 4 mmol/L (ref 3.5–5.1)
Sodium: 132 mmol/L — ABNORMAL LOW (ref 135–145)
Total Bilirubin: 0.2 mg/dL — ABNORMAL LOW (ref 0.3–1.2)
Total Protein: 7.5 g/dL (ref 6.5–8.1)

## 2014-11-07 LAB — LACTIC ACID, PLASMA: Lactic Acid, Venous: 1.2 mmol/L (ref 0.5–2.0)

## 2014-11-07 LAB — LIPASE, BLOOD: Lipase: 41 U/L (ref 22–51)

## 2014-11-07 MED ORDER — ACETAMINOPHEN 650 MG RE SUPP: 650.0000 mg | Freq: Four times a day (QID) | RECTAL | Status: DC | PRN

## 2014-11-07 MED ORDER — METOPROLOL TARTRATE 1 MG/ML IV SOLN: 5.0000 mg | Freq: Four times a day (QID) | INTRAVENOUS | Status: DC | PRN

## 2014-11-07 MED ORDER — SODIUM CHLORIDE 0.9 % IV SOLN
1000.0000 mL | INTRAVENOUS | Status: DC
Start: 2014-11-07 — End: 2014-11-10
  Administered 2014-11-07 – 2014-11-09 (×2): 1000 mL via INTRAVENOUS

## 2014-11-07 MED ORDER — SODIUM CHLORIDE 0.9 % IV SOLN
8.0000 mg | Freq: Four times a day (QID) | INTRAVENOUS | Status: DC | PRN
  Filled 2014-11-07: qty 4

## 2014-11-07 MED ORDER — SODIUM CHLORIDE 0.9 % IV SOLN
1000.0000 mL | Freq: Once | INTRAVENOUS | Status: AC
  Administered 2014-11-07: 1000 mL via INTRAVENOUS

## 2014-11-07 MED ORDER — DIATRIZOATE MEGLUMINE & SODIUM 66-10 % PO SOLN
90.0000 mL | Freq: Once | ORAL | Status: AC
  Administered 2014-11-08: 90 mL via NASOGASTRIC

## 2014-11-07 MED ORDER — PHENOL 1.4 % MT LIQD
2.0000 | OROMUCOSAL | Status: DC | PRN
Start: 2014-11-07 — End: 2014-11-10
  Filled 2014-11-07 (×2): qty 177

## 2014-11-07 MED ORDER — BISACODYL 10 MG RE SUPP
10.0000 mg | Freq: Every day | RECTAL | Status: DC
  Administered 2014-11-08: 10 mg via RECTAL
  Filled 2014-11-07 (×2): qty 1

## 2014-11-07 MED ORDER — LIP MEDEX EX OINT
1.0000 "application " | TOPICAL_OINTMENT | Freq: Two times a day (BID) | CUTANEOUS | Status: DC
  Administered 2014-11-07 – 2014-11-10 (×4): 1 via TOPICAL
  Filled 2014-11-07 (×2): qty 7

## 2014-11-07 MED ORDER — MENTHOL 3 MG MT LOZG
1.0000 | LOZENGE | OROMUCOSAL | Status: DC | PRN
  Filled 2014-11-07: qty 9

## 2014-11-07 MED ORDER — ALUM & MAG HYDROXIDE-SIMETH 200-200-20 MG/5ML PO SUSP: 30.0000 mL | Freq: Four times a day (QID) | ORAL | Status: DC | PRN

## 2014-11-07 MED ORDER — DIPHENHYDRAMINE HCL 50 MG/ML IJ SOLN
12.5000 mg | Freq: Four times a day (QID) | INTRAMUSCULAR | Status: DC | PRN
  Administered 2014-11-09: 25 mg via INTRAVENOUS
  Filled 2014-11-07: qty 1

## 2014-11-07 MED ORDER — LACTATED RINGERS IV BOLUS (SEPSIS)
1000.0000 mL | Freq: Once | INTRAVENOUS | Status: AC
  Administered 2014-11-07: 1000 mL via INTRAVENOUS

## 2014-11-07 MED ORDER — LACTATED RINGERS IV BOLUS (SEPSIS): 1000.0000 mL | Freq: Three times a day (TID) | INTRAVENOUS | Status: AC | PRN

## 2014-11-07 MED ORDER — ONDANSETRON HCL 4 MG/2ML IJ SOLN: 4.0000 mg | Freq: Four times a day (QID) | INTRAMUSCULAR | Status: DC | PRN

## 2014-11-07 MED ORDER — PROMETHAZINE HCL 25 MG/ML IJ SOLN
6.2500 mg | INTRAMUSCULAR | Status: DC | PRN
  Filled 2014-11-07: qty 1

## 2014-11-07 MED ORDER — IOHEXOL 300 MG/ML  SOLN
25.0000 mL | Freq: Once | INTRAMUSCULAR | Status: AC | PRN
  Administered 2014-11-07: 25 mL via ORAL

## 2014-11-07 MED ORDER — IOHEXOL 300 MG/ML  SOLN
100.0000 mL | Freq: Once | INTRAMUSCULAR | Status: AC | PRN
  Administered 2014-11-07: 100 mL via INTRAVENOUS

## 2014-11-07 MED ORDER — ONDANSETRON HCL 4 MG/2ML IJ SOLN
4.0000 mg | Freq: Once | INTRAMUSCULAR | Status: AC
  Administered 2014-11-07: 4 mg via INTRAVENOUS
  Filled 2014-11-07: qty 2

## 2014-11-07 MED ORDER — MAGIC MOUTHWASH
15.0000 mL | Freq: Four times a day (QID) | ORAL | Status: DC | PRN
  Filled 2014-11-07: qty 15

## 2014-11-07 MED ORDER — HYDROMORPHONE HCL 1 MG/ML IJ SOLN: 0.5000 mg | INTRAMUSCULAR | Status: DC | PRN

## 2014-11-07 NOTE — H&P (Addendum)
[facility] History and Physical  Christofer Shen OIN:[id number] DOB: Nov 08, 1945 DOA: 11/07/2014  Referring physician: Merrily Pew, M.D. PCP: Gennette Pac, MD   Chief Complaint: Nausea and vomiting.  HPI: Bardia Wangerin is a 69 y.o. 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 He denies fever, chills, diarrhea (he has had 2 formed stools bowel movements), sick contacts or travel history. He has a history of 2 previous small bowel obstruction episodes after his extensive surgery for sarcoma removal was performed. On both previous episodes, these have resolved spontaneously.  He is currently in no acute distress and denies any other complaints. He denies abdominal pain at this time.   Review of Systems:  Constitutional:  No weight loss, night sweats, Fevers, chills, fatigue.  HEENT:  No headaches, Difficulty swallowing,Tooth/dental problems,Sore throat,  No sneezing, itching, ear ache, nasal congestion, post nasal drip,  Cardio-vascular:  No chest pain, Orthopnea, PND, swelling in lower extremities, anasarca, dizziness, palpitations  GI:  Positive hiccups, indigestion, abdominal pain, nausea, vomiting, diarrhea, change in bowel habits, loss of appetite. Resp:  No shortness of breath with exertion or at rest. No excess mucus, no productive cough, No non-productive cough, No coughing up of blood.No change in color of mucus.No wheezing.No chest wall deformity  Skin:  no rash or lesions.  GU:  no dysuria, change in color of urine, no urgency or frequency. No flank pain.  Musculoskeletal:  No joint pain or swelling. No decreased range of motion. No back pain.  Psych:  No change in  mood or affect. No depression or anxiety. No memory loss.   Past Medical History  Diagnosis Date  . Hypertension   . Elevated cholesterol   . Anginal pain     hx of CP saw Dr Wynonia Lawman 09/20/2013 H&P per chart   . Left Retroperitoneal mass s/p resection/L nephrectomy 07/13/2014   . Postoperative anemia due to acute blood loss 07/15/2014  . SBO (small bowel obstruction) 08/01/2014, 10/11/14  . Retroperitoneal fluid collection   . 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 Surgery for arm fracture Right 1952  . Knee arthroscopy Right 1970's  . Colonoscopy with propofol N/A 10/20/2013    Procedure: COLONOSCOPY WITH PROPOFOL;  Surgeon: Lear Ng, MD;  Location: [facility];  Service: Endoscopy;  Laterality: N/A;  . Hot hemostasis N/A 10/20/2013    Procedure: HOT HEMOSTASIS (ARGON PLASMA COAGULATION/BICAP);  Surgeon: Lear Ng, MD;  Location: [facility];  Service: Endoscopy;  Laterality: N/A;  . Tigger finger release      per H&P per Dr Wynonia Lawman 09/20/2013 (on chart)  . Resection of retroperitoneal mass Left 07/13/2014    Procedure: RESECTION OF left RETROPERITONEAL MASS;  Surgeon: Stark Klein, MD;  Location: [facility];  Service: General;  Laterality: Left;  . Esophagogastroduodenoscopy (egd) with propofol N/A 09/19/2014    Procedure: ESOPHAGOGASTRODUODENOSCOPY (EGD) WITH PROPOFOL;  Surgeon: Wilford Corner, MD;  Location: [facility];  Service: Endoscopy;  Laterality: N/A;   Social History:  reports that he has never smoked. He has never used smokeless tobacco. He reports that he does not drink alcohol or  use illicit drugs.  Allergies  Allergen Reactions  . Compazine [Prochlorperazine] Other (See Comments)    Made pt really lethargic, and sleepy    Family History  Problem Relation Age of Onset  . Adopted: Yes  . Family history unknown: Yes     Prior to Admission medications   Medication Sig Start Date End Date Taking? Authorizing Provider  ertapenem 1 g in sodium chloride 0.9 % 50 mL Inject 1 g into the vein daily after supper. 10/20/14 11/10/14 Yes Megan Darnelle Spangle, PA-C  ferrous sulfate 325 (65 FE) MG tablet TAKE 1 TABLET (325 MG TOTAL) BY MOUTH 2 (TWO) TIMES DAILY WITH A MEAL. 10/20/14  Yes Historical Provider, MD  LORazepam (ATIVAN) 1 MG tablet Take 1 tablet by mouth 3 (three) times daily as needed for anxiety. Anxiety/nausea 09/01/14  Yes Historical Provider, MD  Melatonin 5 MG CAPS Take 1 capsule by mouth at bedtime.  07/27/14  Yes Historical Provider, MD  Multiple Vitamin (MULTIVITAMIN WITH MINERALS) TABS tablet Take 1 tablet by mouth every morning.    Yes Historical Provider, MD  ondansetron (ZOFRAN ODT) 8 MG disintegrating tablet Take 1 tablet (8 mg total) by mouth every 8 (eight) hours as needed for nausea or vomiting. 10/11/14  Yes Jola Schmidt, MD  pantoprazole (PROTONIX) 40 MG tablet Take 1 tablet (40 mg total) by mouth daily. 09/22/14  Yes Donne Hazel, MD  traZODone (DESYREL) 50 MG tablet Take 50 mg by mouth at bedtime.    Yes Historical Provider, MD  chlorproMAZINE (THORAZINE) 25 MG tablet Take 1 tablet (25 mg total) by mouth 3 (three) times daily as needed. 10/26/14   Tyler Pita, MD   Physical Exam: Filed Vitals:   11/07/14 1813 11/07/14 1821 11/07/14 1856 11/07/14 1954  BP: 130/84 128/85 135/72 131/74  Pulse: 105 96 97 99  Temp:    98.9 F (37.2 C)  TempSrc:    Oral  Resp: 19 20 17 23   SpO2: 97% 100% 100% 100%    Wt Readings from Last 3 Encounters:  10/26/14 74.254 kg (163 lb 11.2 oz)  10/12/14 78.971 kg (174 lb 1.6 oz)  09/28/14 79.153 kg (174 lb 8 oz)    General:  Appears calm and comfortable Eyes: PERRL, normal lids, irises & conjunctiva ENT: grossly normal hearing, lips & tongue Neck: no LAD, masses or thyromegaly Cardiovascular: RRR, no m/r/g. No LE edema. Telemetry: SR, no arrhythmias   Respiratory: CTA bilaterally, no w/r/r. Normal respiratory effort. Abdomen: Midline surgical scar, bowel sounds are hyperactive, the abdomen is soft there is epigastric and periumbilical tenderness, there is no guarding or rebound tenderness, there is no no organomegaly palpated. Skin: no rash or induration seen on limited exam Musculoskeletal: grossly normal tone BUE/BLE Psychiatric: grossly normal mood and affect, speech fluent and appropriate Neurologic: grossly non-focal.          Labs on Admission:  Basic Metabolic Panel:  Recent Labs Lab 11/07/14 1740  NA 132*  K 4.0  CL 96*  CO2 24  GLUCOSE 117*  BUN 16  CREATININE 1.20  CALCIUM 9.1   Liver Function Tests:  Recent Labs Lab 11/07/14 1740  AST 21  ALT 14*  ALKPHOS 107  BILITOT 0.2*  PROT 7.5  ALBUMIN 2.9*    Recent Labs Lab 11/07/14 1740  LIPASE 41   CBC:  Recent Labs Lab 11/07/14 1740  WBC 12.2*  NEUTROABS 11.0*  HGB 10.1*  HCT 31.4*  MCV 83.7  PLT 591*    Radiological  Exams on Admission: Ct Abdomen Pelvis W Contrast  11/07/2014   CLINICAL DATA:  Acute epigastric abdominal pain, vomiting.  EXAM: CT ABDOMEN AND PELVIS WITH CONTRAST  TECHNIQUE: Multidetector CT imaging of the abdomen and pelvis was performed using the standard protocol following bolus administration of intravenous contrast.  CONTRAST:  16mL OMNIPAQUE IOHEXOL 300 MG/ML SOLN, 144mL OMNIPAQUE IOHEXOL 300 MG/ML SOLN  COMPARISON:  CT scan of October 31, 2014.  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 Minimal left posterior basilar subsegmental atelectasis is noted.  No gallstones are noted. The liver and pancreas appear normal. Spleen is unchanged. Left retroperitoneal drainage catheter noted on prior exam has been removed. The fluid collection appears to have significantly enlarged compared to prior exam, now measuring 5.6 x 2.6 cm. This is consistent with worsening abscess. Surgical clips are  noted in the left retroperitoneal region which are unchanged compared to prior exam. Status post left nephrectomy. Right adrenal gland and kidney appear normal. Left adrenal gland is not clearly visualized. Stable 3.3 x 2.7 cm lesser sac fluid collection is noted. 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 There is significantly increased small bowel dilatation with transition zone seen in the left upper quadrant in area of previously described surgery, best seen on image number 37 of series 2. More distal small bowel is normal in caliber. Sigmoid diverticulosis is noted without inflammation. Urinary bladder appears normal. No significant adenopathy is noted.  IMPRESSION: Extensive postsurgical changes are noted in left upper quadrant consistent with prior left nephrectomy. There is significantly increased proximal small bowel dilatation with transition zone seen in the left upper quadrant adjacent to postoperative changes, consistent with small bowel obstruction, most likely due to adhesion or scarring.  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  Left retroperitoneal drainage catheter noted on prior exam has been removed in the interval. Significantly increased fluid collection containing gas is seen in this area consistent with abscess.  Stable abscess or fluid collection is seen in the lesser sac.   Electronically Signed   By: Marijo Conception, M.D.   On: 11/07/2014 19:37    EKG: Independently reviewed.  Vent. rate 103 BPM PR interval 150 ms QRS duration 87 ms QT/QTc 429/562 ms P-R-T axes 59 54 74  Sinus tachycardia Prolonged QT interval nonspecifiic st changes in V2 similar to prior  Assessment/Plan Principal Problem:   SBO (small bowel obstruction) Active Problems:   Hypertension   Hyponatremia   Hypoalbuminemia   Stable abscess or fluid collection is seen in the lesser sac  1. Admit to MedSurg.  2. Keep nothing by mouth, except  for meds. 3. Continue IV fluids. 4. Monitor white blood cell count and electrolytes. 5. Continue daily Invanz as per infectious diseases. Patient is supposed to continue at least until the 12th this month and is supposed to be following with ID this week 6. Dr. Merrily Pew from the ER has contacted Dr. Michael Boston from general surgery who recommended NG tube suctioning.I would like to thank both of them further participation in this case.  Code Status: Full code. DVT Prophylaxis: Lovenox SQ. Family Communication:  Rog[place], [place] [phone]  [phone]  Disposition Plan: Admit as an inpatient, and if no complications, discharge home once SBO resolves and cleared by general surgery.  Time spent: Over 70 minutes.  Reubin Milan [facility] Pager [phone].

## 2014-11-07 NOTE — Progress Notes (Signed)
EDCM spoke to patient at bedside.  Patient reports he lives with his wife [name] [phone].  Patient noted to have been admitted and discharged from the hospital from 07/13 to 07/22.  Patient confirms he is still receiving home health services with [facility] for visiting RN for IV antibiotics.  Patient noted to have a PICC line in his right upper arm.  Patient reports he has a walker and a shower bench at home.  Patient reports he is able to complete his ADL's on his own.  Patient confirms he has seen Dr. Barry Dienes twice since discharge and has also seen Dr. Rex Kras his pcp.  Patient reports he cannot think of any further needs at home at this time.  Patient thankful for services.  No further EDCM needs at this time.

## 2014-11-07 NOTE — ED Notes (Signed)
Attempt to call report x 1  

## 2014-11-07 NOTE — ED Provider Notes (Addendum)
CSN: [id number]     Arrival date & time 11/07/14  1612 History   First MD Initiated Contact with Patient 11/07/14 1703     Chief Complaint  Patient presents with  . 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 Has some abdominal pain after vomiting but quickly subsides. Fevers, no constipation, no diarrhea. No blood in his stool or his vomit. He describes vomiting is greenish yellow but mostly yellow with some food content. Not really related to meals sometimes as before symptoms after. He has significant midline epigastric pain with this. Physical similar to the beginning of ileus episodes in the past.  Past Medical History  Diagnosis Date  . Hypertension   . Elevated cholesterol   . Anginal pain     hx of CP saw Dr Wynonia Lawman 09/20/2013 H&P per chart   . Left Retroperitoneal mass s/p resection/L nephrectomy 07/13/2014   . Postoperative anemia due to acute blood loss 07/15/2014  . SBO (small bowel obstruction) 08/01/2014, 10/11/14  . Retroperitoneal fluid collection   . 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 Surgery for arm fracture Right 1952  . Knee arthroscopy Right 1970's  . Colonoscopy with propofol N/A 10/20/2013    Procedure: COLONOSCOPY WITH PROPOFOL;  Surgeon: Lear Ng, MD;  Location: [facility];  Service: Endoscopy;  Laterality: N/A;  . Hot hemostasis N/A 10/20/2013    Procedure: HOT HEMOSTASIS (ARGON PLASMA COAGULATION/BICAP);  Surgeon: Lear Ng, MD;  Location: [facility];  Service: Endoscopy;  Laterality: N/A;  . Tigger finger  release      per H&P per Dr Wynonia Lawman 09/20/2013 (on chart)  . Resection of retroperitoneal mass Left 07/13/2014    Procedure: RESECTION OF left RETROPERITONEAL MASS;  Surgeon: Stark Klein, MD;  Location: [facility];  Service: General;  Laterality: Left;  . Esophagogastroduodenoscopy (egd) with propofol N/A 09/19/2014    Procedure: ESOPHAGOGASTRODUODENOSCOPY (EGD) WITH PROPOFOL;  Surgeon: Wilford Corner, MD;  Location: [facility]COPY;  Service: Endoscopy;  Laterality: N/A;   Family History  Problem Relation Age of Onset  . Adopted: Yes  . Family history unknown: Yes   History  Substance Use Topics  . Smoking status: Never Smoker   . Smokeless tobacco: Never Used  . Alcohol Use: No    Review of Systems  Constitutional: Positive for fatigue. Negative for fever and chills.  Eyes: Negative for pain.  Respiratory: Negative for choking, chest tightness and shortness of breath.   Cardiovascular: Negative for chest pain.  Gastrointestinal: Positive for nausea, vomiting and abdominal pain. Negative for diarrhea and constipation.  Genitourinary: Negative for dysuria and enuresis.  Musculoskeletal: Negative for back pain.  Skin: Negative for pallor and wound.  Neurological: Negative for seizures.  All other systems reviewed and are negative.     Allergies  Compazine  Home Medications   Prior to Admission medications   Medication Sig Start Date End Date Taking? Authorizing Provider  chlorproMAZINE (THORAZINE) 25 MG tablet Take 1 tablet (25 mg total) by mouth 3 (three) times daily as needed. 10/26/14   Tyler Pita, MD  ertapenem 1 g in sodium chloride 0.9 % 50 mL Inject 1 g into the vein daily  after supper. 10/20/14 11/10/14  Nat Christen, PA-C  Ferrous Sulfate (HM IRON SLOW RELEASE) 142 (45 FE) MG TBCR Take 1 tablet by mouth daily.    Historical Provider, MD  LORazepam (ATIVAN) 1 MG tablet Take 1 tablet by mouth 3 (three) times daily as needed for anxiety. Anxiety/nausea 09/01/14   Historical  Provider, MD  Melatonin 5 MG CAPS Take 1 capsule by mouth at bedtime.  07/27/14   Historical Provider, MD  Multiple Vitamin (MULTIVITAMIN WITH MINERALS) TABS tablet Take 1 tablet by mouth every morning.     Historical Provider, MD  ondansetron (ZOFRAN ODT) 8 MG disintegrating tablet Take 1 tablet (8 mg total) by mouth every 8 (eight) hours as needed for nausea or vomiting. 10/11/14   Jola Schmidt, MD  pantoprazole (PROTONIX) 40 MG tablet Take 1 tablet (40 mg total) by mouth daily. 09/22/14   Donne Hazel, MD  traZODone (DESYREL) 50 MG tablet Take 50 mg by mouth at bedtime.     Historical Provider, MD   BP 120/91 mmHg  Pulse 105  Temp(Src) 98.4 F (36.9 C) (Oral)  Resp 18  SpO2 100% Physical Exam  Constitutional: He appears well-developed and well-nourished.  HENT:  Head: Normocephalic and atraumatic.  Mouth/Throat: Mucous membranes are dry.  Neck: Normal range of motion.  Cardiovascular: Regular rhythm.  Tachycardia present.   Pulmonary/Chest: Effort normal. No respiratory distress.  Abdominal: He exhibits no distension. There is tenderness.  Musculoskeletal: Normal range of motion.  Neurological: He is alert.  Skin: Skin is warm and dry. No rash noted. No erythema.  Nursing note and vitals reviewed.   ED Course  Procedures (including critical care time) Labs Review Labs Reviewed  CBC WITH DIFFERENTIAL/PLATELET - Abnormal; Notable for the following:    WBC 12.2 (*)    RBC 3.75 (*)    Hemoglobin 10.1 (*)    HCT 31.4 (*)    RDW 16.6 (*)    Platelets 591 (*)    Neutrophils Relative % 90 (*)    Neutro Abs 11.0 (*)    Lymphocytes Relative 2 (*)    Lymphs Abs 0.3 (*)    All other components within normal limits  COMPREHENSIVE METABOLIC PANEL - Abnormal; Notable for the following:    Sodium 132 (*)    Chloride 96 (*)    Glucose, Bld 117 (*)    Albumin 2.9 (*)    ALT 14 (*)    Total Bilirubin 0.2 (*)    GFR calc non Af Amer 60 (*)    All other components within normal  limits  CBC WITH DIFFERENTIAL/PLATELET - Abnormal; Notable for the following:    RBC 3.16 (*)    Hemoglobin 8.5 (*)    HCT 26.4 (*)    RDW 16.6 (*)    Platelets 481 (*)    Neutrophils Relative % 84 (*)    Lymphocytes Relative 4 (*)    Lymphs Abs 0.3 (*)    All other components within normal limits  COMPREHENSIVE METABOLIC PANEL - Abnormal; Notable for the following:    Calcium 8.5 (*)    Total Protein 6.1 (*)    Albumin 2.2 (*)    AST 14 (*)    ALT 10 (*)    All other components within normal limits  LACTIC ACID, PLASMA  LIPASE, BLOOD    Imaging Review Ct Abdomen Pelvis W Contrast  11/07/2014   CLINICAL DATA:  Acute epigastric abdominal pain, vomiting.  EXAM: CT ABDOMEN AND PELVIS WITH  CONTRAST  TECHNIQUE: Multidetector CT imaging of the abdomen and pelvis was performed using the standard protocol following bolus administration of intravenous contrast.  CONTRAST:  38mL OMNIPAQUE IOHEXOL 300 MG/ML SOLN, 137mL OMNIPAQUE IOHEXOL 300 MG/ML SOLN  COMPARISON:  CT scan of October 31, 2014.  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 Minimal left posterior basilar subsegmental atelectasis is noted.  No gallstones are noted. The liver and pancreas appear normal. Spleen is unchanged. Left retroperitoneal drainage catheter noted on prior exam has been removed. The fluid collection appears to have significantly enlarged compared to prior exam, now measuring 5.6 x 2.6 cm. This is consistent with worsening abscess. Surgical clips are noted in the left retroperitoneal region which are unchanged compared to prior exam. Status post left nephrectomy. Right adrenal gland and kidney appear normal. Left adrenal gland is not clearly visualized. Stable 3.3 x 2.7 cm lesser sac fluid collection is noted. 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 There is significantly increased small bowel dilatation with transition zone seen in the left upper  quadrant in area of previously described surgery, best seen on image number 37 of series 2. More distal small bowel is normal in caliber. Sigmoid diverticulosis is noted without inflammation. Urinary bladder appears normal. No significant adenopathy is noted.  IMPRESSION: Extensive postsurgical changes are noted in left upper quadrant consistent with prior left nephrectomy. There is significantly increased proximal small bowel dilatation with transition zone seen in the left upper quadrant adjacent to postoperative changes, consistent with small bowel obstruction, most likely due to adhesion or scarring.  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  Left retroperitoneal drainage catheter noted on prior exam has been removed in the interval. Significantly increased fluid collection containing gas is seen in this area consistent with abscess.  Stable abscess or fluid collection is seen in the lesser sac.   Electronically Signed   By: Marijo Conception, M.D.   On: 11/07/2014 19:37   Dg Abd Portable 1v-small Bowel Obstruction Protocol-initial, 8 Hr Delay  11/08/2014   CLINICAL DATA:  Small bowel obstruction protocol. Contrast administered at 0345 hours.  EXAM: PORTABLE ABDOMEN - 1 VIEW  COMPARISON:  CT and radiographs 11/07/2014.  FINDINGS: 1156 hours. Nasogastric tube is visualized within the gastric fundus. There is a small amount of contrast within the stomach and distal small bowel which remains dilated. Contrast is also present within the colon, extending to the rectum, increased in density from yesterday, implying that some of the recently administered contrast has passed into the colon. There is still moderate diffuse small bowel distension. Multiple left abdominal surgical clips are noted. No evidence of free intraperitoneal air or contrast extravasation.  IMPRESSION: Persistent moderate small bowel distension in excess of the colon. However, the volume of contrast in the colon has  increased from yesterday's radiographs, implying some passage of contrast through the small bowel.   Electronically Signed   By: Richardean Sale M.D.   On: 11/08/2014 12:31   Dg Abd Portable 1v-small Bowel Protocol-position Verification  11/08/2014   CLINICAL DATA:  Nasogastric tube placement  EXAM: PORTABLE ABDOMEN - 1 VIEW  COMPARISON:  CT abdomen and pelvis November 07, 2014  FINDINGS: Nasogastric tube tip and side port are in the stomach. There are loops of dilated bowel indicating a degree of obstruction. No free air apparent. There are multiple surgical clips in the mid abdomen.  IMPRESSION: Nasogastric tube tip and side port in  stomach. Dilated loops of bowel consistent with a degree of obstruction.   Electronically Signed   By: Lowella Grip III M.D.   On: 11/08/2014 01:12     EKG Interpretation   Date/Time:  Tuesday November 07 2014 16:50:48 EDT Ventricular Rate:  103 PR Interval:  150 QRS Duration: 87 QT Interval:  429 QTC Calculation: 562 R Axis:   54 Text Interpretation:  Sinus tachycardia Prolonged QT interval nonspecifiic  st changes in V2 similar to prior Confirmed by [facility] MD, Corene Cornea [phone]) on  11/07/2014 5:08:16 PM      MDM   Final diagnoses:  Encounter for imaging study to confirm nasogastric (NG) tube placement  Small bowel obstruction    69 year old male here with signs and symptoms consistent with small bowel obstruction confirmed by CT scan in the ED. One episode of vomiting here otherwise no emesis so no NG tube placed. Discussed the case with medicine for admission also consult the surgery who will see the patient while admitted. No surgical abdomen at this time.  The patient appears reasonably stabilized for admission considering the current resources, flow, and capabilities available in the ED at this time, and I doubt any other [facility] requiring further screening and/or treatment in the ED prior to admission.     Merrily Pew, MD 11/08/14 1448  Merrily Pew, MD 02/14/15 4193  Merrily Pew, MD 02/14/15 [phone]

## 2014-11-07 NOTE — ED Notes (Signed)
Pt transported to CT ?

## 2014-11-07 NOTE — ED Notes (Signed)
Pt c/o vomiting x 2 days. Denies blood in emesis. C/o pain in the epigastric area, does not radiate. PICC line in the arm from a previous surgery. Called his MD this morning and was advised to come to the ED.

## 2014-11-07 NOTE — Consult Note (Signed)
[address] [place], [place] [address] Phone: [phone] FAX: [phone]     Yannick Steuber  03-17-1946 [id number]  CARE TEAM:  PCP: Gennette Pac, MD  Outpatient Care Team: Patient Care Team: [name], MD as PCP - General (Family Medicine) Stark Klein, MD as Consulting Physician (General Surgery) Wilford Corner, MD as Consulting Physician (Gastroenterology) Daryll Brod, MD as Consulting Physician (Orthopedic Surgery) Campbell Riches, MD as Consulting Physician (Infectious Diseases)  Inpatient Treatment Team: Treatment Team: Attending Provider: Reubin Milan, MD; Registered Nurse: Leanna Battles, RN; Consulting Physician: Nolon Nations, MD  This patient is a 69 y.o.male who presents today for surgical evaluation at the request of dr Olevia Bowens.   Reason for evaluation: SBO  Pleasant gentleman status post excision of large retroperitoneal mass consistent with liposarcoma in April 2016.  I was complicated by delayed intra-abdominal abscess collections requiring percutaneous drainage and IV antibiotics for many months.  Last drain and removed last week.  Off antibiotics.  Saw operating surgeon, Dr. Barry Dienes, 2 days ago.  Had one episode of emesis but overall felt like he was gradually getting better.  Unfortunately, he began worsening nausea and vomiting to the point of not keeping anything down.  Came to emergency room and got x-rays.  CT scan concerning for bowel obstruction with transition zone in left upper quadrant.  BE admitted by medicine.  Surgical consultation requested.  Patient usually moves his bowels every day.  No severe bouts of constipation or diarrhea prior to this episode.  No other sick contacts or travel history.  No assure fall or trauma.  Did get radiation treatments after surgery to his retroperitneum.  Past Medical History  Diagnosis Date  . Hypertension   . Elevated cholesterol   . Anginal  pain     hx of CP saw Dr Wynonia Lawman 09/20/2013 H&P per chart   . Left Retroperitoneal mass s/p resection/L nephrectomy 07/13/2014   . Postoperative anemia due to acute blood loss 07/15/2014  . SBO (small bowel obstruction) 08/01/2014, 10/11/14  . Retroperitoneal fluid collection   . 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 Surgery for arm fracture Right 1952  . Knee arthroscopy Right 1970's  . Colonoscopy with propofol N/A 10/20/2013    Procedure: COLONOSCOPY WITH PROPOFOL;  Surgeon: Lear Ng, MD;  Location: [facility];  Service: Endoscopy;  Laterality: N/A;  . Hot hemostasis N/A 10/20/2013    Procedure: HOT HEMOSTASIS (ARGON PLASMA COAGULATION/BICAP);  Surgeon: Lear Ng, MD;  Location: [facility];  Service: Endoscopy;  Laterality: N/A;  . Tigger finger release      per H&P per Dr Wynonia Lawman 09/20/2013 (on chart)  . Resection of retroperitoneal mass Left 07/13/2014    Procedure: RESECTION OF left RETROPERITONEAL MASS;  Surgeon: Stark Klein, MD;  Location: [facility];  Service: General;  Laterality: Left;  . Esophagogastroduodenoscopy (egd) with propofol N/A 09/19/2014    Procedure: ESOPHAGOGASTRODUODENOSCOPY (EGD) WITH PROPOFOL;  Surgeon: Wilford Corner, MD;  Location: [facility];  Service: Endoscopy;  Laterality: N/A;    History   Social History  . Marital Status: Married    Spouse Name: [name]  . Number of Children: 4  . Years of Education: N/A   Occupational History  . Retired Armed forces operational officer    Social History Main Topics  . Smoking status: Never  Smoker   . Smokeless tobacco: Never Used  . Alcohol Use: No  . Drug Use: No  . Sexual Activity: Not Currently   Other Topics Concern  . Not on file   Social History Narrative   Married.  Lives in [place] with wife.    Family History  Problem Relation Age of Onset  . Adopted: Yes   . Family history unknown: Yes    Current Facility-Administered Medications  Medication Dose Route Frequency Provider Last Rate Last Dose  . 0.9 %  sodium chloride infusion  1,000 mL Intravenous Continuous Merrily Pew, MD 125 mL/hr at 11/07/14 1822 1,000 mL at 11/07/14 7048   Current Outpatient Prescriptions  Medication Sig Dispense Refill  . ertapenem 1 g in sodium chloride 0.9 % 50 mL Inject 1 g into the vein daily after supper. 21 Dose 0  . ferrous sulfate 325 (65 FE) MG tablet TAKE 1 TABLET (325 MG TOTAL) BY MOUTH 2 (TWO) TIMES DAILY WITH A MEAL.  0  . LORazepam (ATIVAN) 1 MG tablet Take 1 tablet by mouth 3 (three) times daily as needed for anxiety. Anxiety/nausea  0  . Melatonin 5 MG CAPS Take 1 capsule by mouth at bedtime.     . Multiple Vitamin (MULTIVITAMIN WITH MINERALS) TABS tablet Take 1 tablet by mouth every morning.     . ondansetron (ZOFRAN ODT) 8 MG disintegrating tablet Take 1 tablet (8 mg total) by mouth every 8 (eight) hours as needed for nausea or vomiting. 12 tablet 0  . pantoprazole (PROTONIX) 40 MG tablet Take 1 tablet (40 mg total) by mouth daily. 60 tablet 0  . traZODone (DESYREL) 50 MG tablet Take 50 mg by mouth at bedtime.     . chlorproMAZINE (THORAZINE) 25 MG tablet Take 1 tablet (25 mg total) by mouth 3 (three) times daily as needed. 45 tablet 5     Allergies  Allergen Reactions  . Compazine [Prochlorperazine] Other (See Comments)    Made pt really lethargic, and sleepy    ROS: Constitutional:  No fevers, chills, sweats.  Weight stable Eyes:  No vision changes, No discharge HENT:  No sore throats, nasal drainage Lymph: No neck swelling, No bruising easily Pulmonary:  No cough, productive sputum CV: No orthopnea, PND  Patient walks 10-20 minutes without difficulty.  No exertional chest/neck/shoulder/arm pain. GI: No personal nor family history of GI/colon cancer, inflammatory bowel disease, irritable bowel syndrome, allergy such as Celiac Sprue,  dietary/dairy problems, colitis, ulcers nor gastritis.  No recent sick contacts/gastroenteritis.  No travel outside the country.  No changes in diet. Renal: No UTIs, No hematuria Genital:  No drainage, bleeding, masses Musculoskeletal: No severe joint pain.  Good ROM major joints Skin:  No sores or lesions.  No rashes Heme/Lymph:  No easy bleeding.  No swollen lymph nodes Neuro: No focal weakness/numbness.  No seizures Psych: No suicidal ideation.  No hallucinations  BP 131/74 mmHg  Pulse 99  Temp(Src) 98.9 F (37.2 C) (Oral)  Resp 23  SpO2 100%  Physical Exam: General: Pt awake/alert/oriented x4 in no major acute distress Eyes: PERRL, normal EOM. Sclera nonicteric Neuro: CN II-XII intact w/o focal sensory/motor deficits. Lymph: No head/neck/groin lymphadenopathy Psych:  No delerium/psychosis/paranoia HENT: Normocephalic, Mucus membranes moist.  No thrush Neck: Supple, No tracheal deviation Chest: No pain.  Good respiratory excursion. CV:  Pulses intact.  Regular rhythm Abdomen: Soft, Mildly distended.  Nontender.  No incarcerated hernias.  Midline incision well-healed.  No obvious hernias.  No infection or  drainage. Ext:  SCDs BLE.  No significant edema.  No cyanosis Skin: No petechiae / purpurea.  No major sores Musculoskeletal: No severe joint pain.  Good ROM major joints   Results:   Labs: Results for orders placed or performed during the hospital encounter of 11/07/14 (from the past 48 hour(s))  CBC WITH DIFFERENTIAL     Status: Abnormal   Collection Time: 11/07/14  5:40 PM  Result Value Ref Range   WBC 12.2 (H) 4.0 - 10.5 K/uL   RBC 3.75 (L) 4.22 - 5.81 MIL/uL   Hemoglobin 10.1 (L) 13.0 - 17.0 g/dL   HCT 31.4 (L) 39.0 - 52.0 %   MCV 83.7 78.0 - 100.0 fL   MCH 26.9 26.0 - 34.0 pg   MCHC 32.2 30.0 - 36.0 g/dL   RDW 16.6 (H) 11.5 - 15.5 %   Platelets 591 (H) 150 - 400 K/uL   Neutrophils Relative % 90 (H) 43 - 77 %   Neutro Abs 11.0 (H) 1.7 - 7.7 K/uL   Lymphocytes  Relative 2 (L) 12 - 46 %   Lymphs Abs 0.3 (L) 0.7 - 4.0 K/uL   Monocytes Relative 7 3 - 12 %   Monocytes Absolute 0.9 0.1 - 1.0 K/uL   Eosinophils Relative 1 0 - 5 %   Eosinophils Absolute 0.1 0.0 - 0.7 K/uL   Basophils Relative 0 0 - 1 %   Basophils Absolute 0.0 0.0 - 0.1 K/uL  Comprehensive metabolic panel     Status: Abnormal   Collection Time: 11/07/14  5:40 PM  Result Value Ref Range   Sodium 132 (L) 135 - 145 mmol/L   Potassium 4.0 3.5 - 5.1 mmol/L   Chloride 96 (L) 101 - 111 mmol/L   CO2 24 22 - 32 mmol/L   Glucose, Bld 117 (H) 65 - 99 mg/dL   BUN 16 6 - 20 mg/dL   Creatinine, Ser 1.20 0.61 - 1.24 mg/dL   Calcium 9.1 8.9 - 10.3 mg/dL   Total Protein 7.5 6.5 - 8.1 g/dL   Albumin 2.9 (L) 3.5 - 5.0 g/dL   AST 21 15 - 41 U/L   ALT 14 (L) 17 - 63 U/L   Alkaline Phosphatase 107 38 - 126 U/L   Total Bilirubin 0.2 (L) 0.3 - 1.2 mg/dL   GFR calc non Af Amer 60 (L) >60 mL/min   GFR calc Af Amer >60 >60 mL/min    Comment: (NOTE) The eGFR has been calculated using the CKD EPI equation. This calculation has not been validated in all clinical situations. eGFR's persistently <60 mL/min signify possible Chronic Kidney Disease.    Anion gap 12 5 - 15  Lactic acid, plasma     Status: None   Collection Time: 11/07/14  5:40 PM  Result Value Ref Range   Lactic Acid, Venous 1.2 0.5 - 2.0 mmol/L  Lipase, blood     Status: None   Collection Time: 11/07/14  5:40 PM  Result Value Ref Range   Lipase 41 22 - 51 U/L    Imaging / Studies: Ct Abdomen Pelvis W Contrast  11/07/2014   CLINICAL DATA:  Acute epigastric abdominal pain, vomiting.  EXAM: CT ABDOMEN AND PELVIS WITH CONTRAST  TECHNIQUE: Multidetector CT imaging of the abdomen and pelvis was performed using the standard protocol following bolus administration of intravenous contrast.  CONTRAST:  88m OMNIPAQUE IOHEXOL 300 MG/ML SOLN, 1051mOMNIPAQUE IOHEXOL 300 MG/ML SOLN  COMPARISON:  CT scan of October 31, 2014.  FINDINGS: Severe  multilevel degenerative disc disease is noted in the lower lumbar spine. Minimal left posterior basilar subsegmental atelectasis is noted.  No gallstones are noted. The liver and pancreas appear normal. Spleen is unchanged. Left retroperitoneal drainage catheter noted on prior exam has been removed. The fluid collection appears to have significantly enlarged compared to prior exam, now measuring 5.6 x 2.6 cm. This is consistent with worsening abscess. Surgical clips are noted in the left retroperitoneal region which are unchanged compared to prior exam. Status post left nephrectomy. Right adrenal gland and kidney appear normal. Left adrenal gland is not clearly visualized. Stable 3.3 x 2.7 cm lesser sac fluid collection is noted. 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 There is significantly increased small bowel dilatation with transition zone seen in the left upper quadrant in area of previously described surgery, best seen on image number 37 of series 2. More distal small bowel is normal in caliber. Sigmoid diverticulosis is noted without inflammation. Urinary bladder appears normal. No significant adenopathy is noted.  IMPRESSION: Extensive postsurgical changes are noted in left upper quadrant consistent with prior left nephrectomy. There is significantly increased proximal small bowel dilatation with transition zone seen in the left upper quadrant adjacent to postoperative changes, consistent with small bowel obstruction, most likely due to adhesion or scarring.  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  Left retroperitoneal drainage catheter noted on prior exam has been removed in the interval. Significantly increased fluid collection containing gas is seen in this area consistent with abscess.  Stable abscess or fluid collection is seen in the lesser sac.   Electronically Signed   By: Marijo Conception, M.D.   On: 11/07/2014 19:37   Ct Abdomen  Pelvis W Contrast  10/31/2014   CLINICAL DATA:  Follow-up retroperitoneal abscess. 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  EXAM: CT ABDOMEN AND PELVIS WITH CONTRAST  TECHNIQUE: Multidetector CT imaging of the abdomen and pelvis was performed using the standard protocol following bolus administration of intravenous contrast.  CONTRAST:  172m ISOVUE-300 IOPAMIDOL (ISOVUE-300) INJECTION 61%  COMPARISON:  10/19/2014  FINDINGS: There is a small residual left pleural effusion, decreased from prior. Mild left basilar atelectasis is noted.  Subcentimeter low-density lesions in the right hepatic dome and near the gallbladder fossa are unchanged and too small to characterize. The gallbladder, right adrenal gland, and pancreas are unremarkable. 1.2 cm low-density lesion in the right kidney is unchanged and compatible with a cyst. The left adrenal gland is not identified. Sequelae of left nephrectomy are again identified.  A left-sided retroperitoneal pigtail drainage catheter remains in place. A small amount of fluid and gas remain in the left upper quadrant adjacent to the drainage catheter and along the medial aspect of the spleen, similar to the prior study. Small abscess immediately inferior to the drainage catheter in the left psoas muscle measures approximately 19 x 11 mm, stable to minimally larger than on the prior study (series 3, image 34). Retroperitoneal soft tissue thickening/phlegmon extends anteriorly along the left aspect of the aorta and posterior to the pancreas, similar to prior.  Multiple small rim enhancing abscesses situated between the spleen and stomach do not appear significantly changed (series 3, image 19). Residual dominant fluid collection in the lesser sac measures 3.6 x 2.9 cm, unchanged (series 3, image 24). No new fluid collection is identified.  Small bowel dilatation on the prior study has largely resolved, without current evidence of  high-grade  bowel obstruction. A loop of small bowel remains borderline dilated and mildly thick walled in the left upper quadrant with a transition to decompressed small bowel near the drainage catheter. A few small bowel loops in the right mid abdomen remain borderline dilated measuring up to 3 cm in size, improved from prior.  There is mild wall thickening versus underdistention of multiple small bowel loops in the lower abdomen/ pelvis. There is persistent, mild to moderate wall thickening involving a portion of the distal left transverse colon which is inseparable to the 3.6 cm abscess described above. There is also some persistent wall thickening of the proximal descending colon which is in close proximity to the abscess drain. Sigmoid colonic diverticulosis is again seen with persistent, mild pericolonic stranding in this region, not significantly changed.  Moderate aortoiliac atherosclerotic calcification is noted. Bladder is unremarkable. There is at most trace free fluid in the pelvis. Trace perihepatic fluid on the prior study has resolved. Moderate to severe lower lumbar disc degeneration and facet arthrosis are noted.  IMPRESSION: 1. Unchanged 3.6 cm dominant lesser sac abscess with unchanged appearance of adjacent smaller abscesses. 2. Small amount of fluid and gas near the retroperitoneal drainage catheter, similar to prior. 1.9 cm left psoas abscess is stable to at most minimally larger. 3. Improved/resolved small bowel obstruction. A few borderline dilated loops remain, and persistent partial obstruction is not excluded. 4. Persistent wall thickening of distal transverse and proximal descending colon near the abscesses. 5. No new collection.   Electronically Signed   By: Logan Bores   On: 10/31/2014 16:29   Ct Abdomen Pelvis W Contrast  10/19/2014   CLINICAL DATA:  69 year-old male who underwent a left nephrectomyand excision of retroperitoneal liposarcoma on 8/91/6945 complicated by development of a  retroperitonealabscess. Percutaneous drain placed on 08/15/2014 , and drainage catheter removed on 09/26/2014. Recent presentation with nausea, vomiting and bloating and increased left retroperitoneal abscess with multiple smaller abscesses which communicate with the larger one and a small bowel obstruction with transition zone in the left upper abdomen. Status post repeat percutaneous left abdominal abscess drain placement on 10/12/2014.  Subsequent encounter.  EXAM: CT ABDOMEN AND PELVIS WITH CONTRAST  TECHNIQUE: Multidetector CT imaging of the abdomen and pelvis was performed using the standard protocol following bolus administration of intravenous contrast.  CONTRAST:  90m OMNIPAQUE IOHEXOL 300 MG/ML  SOLN  COMPARISON:  Abdominal radiographs 10/16/2014 and earlier. CT drain placement 10/12/2014. CT Abdomen and Pelvis 10/12/2014, and earlier  FINDINGS: Stable small left pleural effusion. No pericardial effusion. Stable mild bibasilar atelectasis.  Stable visualized osseous structures.  Stable trace pelvic free fluid. Negative rectum. Negative urinary bladder.  Stable an mildly indistinct appearance of the sigmoid colon with underlying chronic diverticulosis.  The left colon now is decompressed. A portion of the left colon passes in proximity to the left retroperitoneal abscess and drain and remains moderately thickened. The splenic flexure has a more normal appearance. However, the distal transverse colon in the left upper quadrant now appears abnormally thickened, an the wall appears edematous. The dependent wall appears more hypodense (series 2, image 30), and this is in proximity to 1 of the residual abscess components near the lesser sac. A segment of about 9 cm is affected. See also coronal image 16.  Upstream of that segment of the transverse colon and right colon appear normal. The terminal ileum is decompressed. There are multiple decompressed small bowel loops in the pelvis. There are multiple  decompressed  small bowel loops in the left lateral abdomen. There are upstream dilated small bowel loops with air and fluid levels, and once again the transition point appears to be the site of the left retroperitoneal abscess on series 2, image 37. Small bowel dilatation has slightly regressed  Since 10/12/2014. The stomach appears stable, including a dominant 3.7 cm lesser sac abscess (series 2, image 26), with evidence of smaller gastrosplenic ligament abscesses (image 22). The dominant abscess in the lesser sac no longer contains gas. The pancreas is abutted by the lesser sac abscess but otherwise enhances normally. No pancreatic ductal dilatation. The liver, gallbladder, right adrenal gland, and right kidney appear stable and within normal limits. Right renal contrast excretion an the right ureter appear within normal limits.  Posterior approach percutaneous retroperitoneal abscess drain remains in place with interval drainage of the dominant abscess (which measured up to 7.2 cm recently). Small volume residual left abscess adjacent to the left psoas muscle (series 2, image 38). Small volume residual gas. The spleen is abutted by these changes and appears stable. The left kidney is surgically absent, with numerous surgical clips also in the region.  The portal venous system appears patent. Aortoiliac calcified atherosclerosis noted. Major arterial structures in the abdomen and pelvis are patent. Trace abdominal free fluid adjacent to the liver is stable. No pneumoperitoneum.  IMPRESSION: 1. Satisfactory drainage of the dominant left retroperitoneal abscess following drain replacement recently. However, multiple regional daughter abscesses remain, including the largest which is 3.7 cm in the lesser sac. 2. Persistent small bowel obstruction, with transition point abutting the dominant draining abscess. Small bowel dilatation perhaps slightly regressed in the last week. 3. Secondary inflammation of the descending  colon near the dominant draining abscess. New 9 cm segment of thick walled distal transverse colon where sub serosal involvement by abscess is difficult to exclude (series 2, image 30). 4. Stable layering left pleural effusion and minor lung base atelectasis. Stable small perihepatic and pelvic free fluid.   Electronically Signed   By: Genevie Ann M.D.   On: 10/19/2014 11:31   Ct Abdomen Pelvis W Contrast  10/12/2014   CLINICAL DATA:  69 year old male with nausea and vomiting. History of bowel obstruction history of left retroperitoneal liposarcoma status post resection  EXAM: CT ABDOMEN AND PELVIS WITH CONTRAST  TECHNIQUE: Multidetector CT imaging of the abdomen and pelvis was performed using the standard protocol following bolus administration of intravenous contrast.  CONTRAST:  69m OMNIPAQUE IOHEXOL 300 MG/ML  SOLN  COMPARISON:  CT dated 08/29/2014  FINDINGS: Partially visualized small left pleural effusion. There is atelectatic changes of the left lung base. Pneumonia is not excluded. No intra-abdominal free air. There is diffuse mesenteric stranding and small perihepatic and right lower quadrant free fluid.  Subcentimeter hepatic hypodense lesion is too small to characterize. The liver is otherwise unremarkable. The gallbladder, pancreas, spleen, right adrenal gland appear unremarkable. Stable right renal hypodense lesion, likely a cyst. There is no hydronephrosis on the right. The visualized right ureter and urinary bladder as well as the prostate and seminal vesicles are grossly unremarkable.  The left adrenal gland is not visualized. Postsurgical changes of left nephrectomy. There is a 7.2 x 4.0 cm complex fluid collection containing small pockets of air in the left retroperitoneum and along the left psoas muscle compatible with an abscess. There has been interval removal of the percutaneous drainage catheter with interval increase in size of this collection compared to the prior study. Multiple loculated  fluid noted  along the greater curvature of the stomach and superior to the pancreas compatible with multiloculated abscesses. There has been interval increase in the size of this abscess is compared to the prior study. The largest pocket measures approximately 4.3 x 2.6 cm (series 6, image 8). A tract like structure containing pockets of air noted extending from the abscesses along the stomach wall to the larger abscess in the left retroperitoneum along the psoas muscle.  An enteric tube is noted with tip in the stomach. There is dilatation of loops of proximal and mid small bowel. The distal small bowel and terminal ileum are collapsed. The transition zone is noted in the left upper abdomen (coronal series 5, image 48 and axial series 2, image 33) adjacent to the retroperitoneal abscess and surgical clips. This may be related to surrounding inflammation or adhesion. There is extensive colonic diverticulosis. There is focal inflammation and thickening of the upper descending colon adjacent to the retroperitoneal abscess, likely secondary to adjacent inflammation. The appendix appears unremarkable.  There is aortoiliac atherosclerotic disease. No portal venous gas. Midline vertical anterior abdominal wall incisional scar. Degenerative changes of the spine. No acute fracture.  IMPRESSION: Interval removal of the left posterior percutaneous drainage catheter with interval increase in the size of the left retroperitoneal abscess. Multiple smaller abscesses noted along the posterior wall and greater curvature of the stomach which also have increased in size compared to the prior study. These collections appear to communicate with the larger abscess in the left retroperitoneum along the psoas muscle.  Small-bowel obstruction with transition zone in the left upper abdomen adjacent to the retroperitoneal collection. The cause of the obstruction may be related to adjacent inflammation or adhesions.  Small left pleural  effusion with partial left lower lobe atelectasis/pneumonia.   Electronically Signed   By: Anner Crete M.D.   On: 10/12/2014 04:00   Ir Fluoro Guide Cv Line Right  10/12/2014   INDICATION: Retroperitoneal Abscess: Need for Central Venous Access for Prolonged Intravenous Antibiotics  EXAM: ULTRASOUND AND FLUOROSCOPIC GUIDED PICC LINE INSERTION  MEDICATIONS: None.  CONTRAST:  None  FLUOROSCOPY TIME:  6 seconds.  COMPLICATIONS: None immediate  TECHNIQUE: The procedure, risks, benefits, and alternatives were explained to the patient and informed written consent was obtained. A timeout was performed prior to the initiation of the procedure.  The right upper extremity was prepped with chlorhexidine in a sterile fashion, and a sterile drape was applied covering the operative field. Maximum barrier sterile technique with sterile gowns and gloves were used for the procedure. A timeout was performed prior to the initiation of the procedure. Local anesthesia was provided with 1% lidocaine.  Under direct ultrasound guidance, the right brachial vein was accessed with a micropuncture kit after the overlying soft tissues were anesthetized with 1% lidocaine. An ultrasound image was saved for documentation purposes. A guidewire was advanced to the level of the superior caval-atrial junction for measurement purposes and the PICC line was cut to length. A peel-away sheath was placed and a 39 cm, 5 [place], dual lumen was inserted to level of the superior caval-atrial junction. A post procedure spot fluoroscopic was obtained. The catheter easily aspirated and flushed and was sutured in place. A dressing was placed. The patient tolerated the procedure well without immediate post procedural complication.  FINDINGS: After catheter placement, the tip lies within the superior cavoatrial junction. The catheter aspirates and flushes normally and is ready for immediate use.  IMPRESSION: Successful ultrasound and fluoroscopic guided  placement  of a right brachial vein approach, 39 cm, 5 [place], dual lumen PICC with tip at the superior caval-atrial junction. The PICC line is ready for immediate use.  Read By:  Gareth Eagle, PA-C   Electronically Signed   By: Jacqulynn Cadet M.D.   On: 10/12/2014 13:50   Ir US Guide Vasc Access Right  10/12/2014   INDICATION: Retroperitoneal Abscess: Need for Central Venous Access for Prolonged Intravenous Antibiotics  EXAM: ULTRASOUND AND FLUOROSCOPIC GUIDED PICC LINE INSERTION  MEDICATIONS: None.  CONTRAST:  None  FLUOROSCOPY TIME:  6 seconds.  COMPLICATIONS: None immediate  TECHNIQUE: The procedure, risks, benefits, and alternatives were explained to the patient and informed written consent was obtained. A timeout was performed prior to the initiation of the procedure.  The right upper extremity was prepped with chlorhexidine in a sterile fashion, and a sterile drape was applied covering the operative field. Maximum barrier sterile technique with sterile gowns and gloves were used for the procedure. A timeout was performed prior to the initiation of the procedure. Local anesthesia was provided with 1% lidocaine.  Under direct ultrasound guidance, the right brachial vein was accessed with a micropuncture kit after the overlying soft tissues were anesthetized with 1% lidocaine. An ultrasound image was saved for documentation purposes. A guidewire was advanced to the level of the superior caval-atrial junction for measurement purposes and the PICC line was cut to length. A peel-away sheath was placed and a 39 cm, 5 [place], dual lumen was inserted to level of the superior caval-atrial junction. A post procedure spot fluoroscopic was obtained. The catheter easily aspirated and flushed and was sutured in place. A dressing was placed. The patient tolerated the procedure well without immediate post procedural complication.  FINDINGS: After catheter placement, the tip lies within the superior cavoatrial junction.  The catheter aspirates and flushes normally and is ready for immediate use.  IMPRESSION: Successful ultrasound and fluoroscopic guided placement of a right brachial vein approach, 39 cm, 5 French, dual lumen PICC with tip at the superior caval-atrial junction. The PICC line is ready for immediate use.  Read By:  Gareth Eagle, PA-C   Electronically Signed   By: Jacqulynn Cadet M.D.   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  EXAM: ABSCESS INJECTION  Date: 10/31/2014  PROCEDURE: 1. Catheter injection under fluoroscopy Interventional Radiologist:  Criselda Peaches, MD  ANESTHESIA/SEDATION: None required  MEDICATIONS: None  FLUOROSCOPY TIME:  0 minutes 36 seconds  59 dGy  CONTRAST:  10 mL Omnipaque 350  TECHNIQUE: An injection of contrast material under real-time fluoroscopic guidance demonstrates a collapsed abscess cavity about the drainage catheter. There is reflux of contrast material along the tube to the skin surface. No evidence of fistulization to adjacent large or small bowel.  COMPLICATIONS: None  IMPRESSION: 1. No evidence of fistulization. 2. Minimal residual collapsed abscess cavity about the drainage catheter. Injected contrast material refluxes along the tube to the skin surface. 3. The tube was removed.  Signed,  Criselda Peaches, MD  [facility]  [facility]   Electronically Signed   By: Jacqulynn Cadet M.D.   On: 10/31/2014 17:17   Dg Abd 2 Views  10/16/2014   CLINICAL DATA:  Persistent abdominal pain and distention  EXAM: ABDOMEN - 2 VIEW  COMPARISON:  October 15, 2014  FINDINGS: Supine and upright  images obtained. Compared to 1 day prior, there is no longer appreciable small bowel dilatation. Multiple air-fluid levels, however, do remain. No free air. Nasogastric tube tip and side port in stomach.  Drain in the left abdomen remains. Multiple surgical clips are noted on the left, stable.  IMPRESSION: Resolution of bowel dilatation. Multiple air-fluid levels remain. Nasogastric tube tip and side port in stomach. Suspect partial but incomplete resolution of bowel obstruction. No free air.   Electronically Signed   By: Lowella Grip III M.D.   On: 10/16/2014 07:29   Dg Abd 2 Views  10/15/2014   CLINICAL DATA:  Nasogastric tube in place there follow-up small bowel obstruction. Patient reports back pain when moving. History of left kidney drain.  EXAM: ABDOMEN - 2 VIEW  COMPARISON:  10/14/2014 and earlier  FINDINGS: Nasogastric tube is in place with tip overlying the region of the stomach. There is dense opacity in the left lower lobe of the lung consistent with atelectasis, consolidation and pleural effusion.  Surgical clips and drain are identified in the left upper quadrant as before.  There is moderate dilatation of small bowel loops which contain air-fluid levels on the erect view. The degree of dilatation is similar compared prior studies. However fewer bowel loops appear dilated compared with prior studies. There has been interval evacuation of some of the oral contrast from the colon since prior study.  IMPRESSION: 1. Persistent small bowel dilatation. 2. Interval evacuation of some of the contrast from the colon. 3. Left lower lobe atelectasis/consolidation and effusion.   Electronically Signed   By: Nolon Nations M.D.   On: 10/15/2014 09:10   Dg Abd Acute W/chest  10/12/2014   CLINICAL DATA:  Nausea and vomiting since yesterday. 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 Nonsmoker.  EXAM: DG ABDOMEN ACUTE W/ 1V CHEST  COMPARISON:  CT abdomen and pelvis 08/29/2014. Abdominal series [id number].  FINDINGS: Blunting of the left costophrenic angle without change since previous study, likely representing pleural thickening. Normal heart size and pulmonary vascularity. No focal  airspace disease or consolidation in the lungs. No pneumothorax. Mediastinal contours appear intact.  Surgical clips in the left upper quadrant. Interval development of prominent gaseous distention of mid abdominal small bowel with paucity of gas in the colon. Multiple air-fluid levels. Findings suggest small bowel obstruction. No free intra-abdominal air. No radiopaque stones. Degenerative changes in the spine and hips.  IMPRESSION: Gaseous distention of small bowel with air-fluid levels consistent with small bowel obstruction.   Electronically Signed   By: Lucienne Capers M.D.   On: 10/12/2014 00:27   Dg Abd Portable 1v  10/14/2014   CLINICAL DATA:  Small-bowel obstruction. History of left retroperitoneal mass, retroperitoneal fluid collection, resection of retroperitoneal mass.  EXAM: PORTABLE ABDOMEN - 1 VIEW  COMPARISON:  7152 I  FINDINGS: There is residual contrast in the colon following recent CT exam. A left-sided percutaneous drain remains in place in the regional numerous surgical clips. There is dilatation of small bowel loops in the central abdomen. There has been slight interval improvement in dilatation since previous exam.  IMPRESSION: 1. Persistent small bowel obstruction showing slight interval decrease in distension of small bowel loops. 2. Persistent left upper quadrant drain. 3. Residual contrast in the colon.   Electronically Signed   By: Nolon Nations M.D.   On: 10/14/2014 07:26   Dg Abd Portable 1v  10/13/2014   CLINICAL DATA:  Followup small bowel obstruction. Abdominal abscess. Retroperitoneal liposarcoma.  EXAM: PORTABLE ABDOMEN - 1 VIEW  COMPARISON:  10/11/2014  FINDINGS: A new nasogastric tube is seen with tip in the proximal stomach. Decreased small bowel dilatation is seen compared to previous study. Oral contrast from recent CT is now seen in the colon.  A new percutaneous drainage catheter is seen in the left upper quadrant. Multiple surgical clips again seen in left  quadrant.  IMPRESSION: Decreased small bowel dilatation since prior exam. Nasogastric tube in appropriate position.   Electronically Signed   By: Earle Gell M.D.   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 Barry Dienes. This was complicated by development of a retroperitoneal abscess. Dr. Pascal Lux placed a drain on 08/15/2014. Patient then underwent a drain/fistula check by Dr. Pascal Lux on 09/26/2014. There was no evidence of fistulous connection to any adjacent organ and the drainage catheter was removed.He had been doing well until Tuesday afternoon when he developed nausea, vomiting and bloating. CT shows an increase of the left retroperitoneal abscess with multiple smaller abscesses which communicate with the larger one and a small bowel obstruction with transition zone in the left upper abdomen. Patient presents for replacement of percutaneous drainage catheter.  EXAM: CT IMAGE GUIDED DRAINAGE BY PERCUTANEOUS CATHETER  Date: 10/12/2014  PROCEDURE: 1. Placement of a 12 French drainage catheter modified with additional sideholes into the left retroperitoneal abscess Interventional Radiologist:  Criselda Peaches, MD  ANESTHESIA/SEDATION: Moderate (conscious) sedation was used. 4 mg Versed, 50 mcg Fentanyl were administered intravenously. The patient's vital signs were monitored continuously by radiology nursing throughout the procedure.  Sedation Time: 33 minutes  MEDICATIONS: None additional  TECHNIQUE: Informed consent was obtained from the patient following explanation of the procedure, risks, benefits and alternatives. The patient understands, agrees and consents for the procedure. All questions were addressed. A time out was performed.  A planning axial CT scan was performed. The complex fluid and gas collection in the left retroperitoneal space  was successfully identified. A suitable entry site just medial and inferior to the left twelfth rib was selected. The skin was marked. The region was then sterilely prepped and draped in standard fashion using Betadine skin prep. Local anesthesia was attained by infiltration with 1% lidocaine. A small dermatotomy was made. Under intermittent CT fluoroscopic guidance, an 18 gauge trocar needle was advanced into the inferior aspect of the left retroperitoneal fluid collection. A short Amplatz wire was then advanced cranially within the fluid collection. The skin tract was then dilated and a Cook 12 [place] all-purpose drainage catheter modified with additional sideholes was advanced over the wire and formed within the abscess cavity. Aspiration yields approximately 100 mL thick, purulent material.  Post aspiration CT imaging demonstrates resolution of fluid, however there is a persistent thick walled cavity containing air which has not collapsed around the catheter. The catheter was secured to the skin with 0 Prolene suture and connected to JP bulb suction.  COMPLICATIONS: None  Estimated blood loss: 0  IMPRESSION: 1. Successful placement of a 12 French drainage catheter modified with additional sideholes into the recurrent left retroperitoneal abscess cavity. Aspiration yields 100 mL thick, purulent material. Samples were sent for culture, anaerobic culture and fungal culture. 2. Following aspiration, the thick walled cavity does not collapse and remains present as an air containing potential space.  Signed,  Criselda Peaches, MD  [facility]  [facility]   Electronically Signed   By: Jacqulynn Cadet M.D.   On: 10/12/2014 17:02    Medications / Allergies: per chart  Antibiotics: Anti-infectives    None      Assessment  Darryl Griffith  69 y.o. male       Problem List:  Active Problems:   Small bowel obstruction   Nausea vomiting and abdominal pain  and CT scan findings strongly suspicious for small bowel obstruction.  Most likely due to adhesions from prior surgery.  Plan:  Admit.  IV fluid resuscitation.  CCS small bowel protocol.  Nasogastric tube for 2 hours, enteral contrast, and then follow-up x-ray.  If no evidence of improvement clinically by 48/72 hours and small bowel x-ray protocol suspicious for obstructive etiology, we will regroup.  Otherwise if gradually improving or x-ray showed contrast in: Quickly, [place] advance and he will follow to the 75% of folks with bowel obstructions that resolved without surgery.  Hold off on any antibiotics since there is no evidence of recurrent abscess.  -VTE prophylaxis- SCDs, etc  -mobilize as tolerated to help recovery    [name], M.D., F.A.C.S. Gastrointestinal [facility]imally [facility] [address] [place], [place] [address] [phone] Main / Paging   11/07/2014  Note: Portions of this report may have been transcribed using voice recognition software. Every effort was made to ensure accuracy; however, inadvertent computerized transcription errors may be present.   Any transcriptional errors that result from this process are unintentional.

## 2014-11-08 ENCOUNTER — Inpatient Hospital Stay (HOSPITAL_COMMUNITY): Payer: Medicare Other

## 2014-11-08 DIAGNOSIS — E871 Hypo-osmolality and hyponatremia: Secondary | ICD-10-CM

## 2014-11-08 DIAGNOSIS — E440 Moderate protein-calorie malnutrition: Secondary | ICD-10-CM | POA: Insufficient documentation

## 2014-11-08 LAB — CBC WITH DIFFERENTIAL/PLATELET
Basophils Absolute: 0 10*3/uL (ref 0.0–0.1)
Basophils Relative: 0 % (ref 0–1)
Eosinophils Absolute: 0.1 10*3/uL (ref 0.0–0.7)
Eosinophils Relative: 1 % (ref 0–5)
HCT: 26.4 % — ABNORMAL LOW (ref 39.0–52.0)
Hemoglobin: 8.5 g/dL — ABNORMAL LOW (ref 13.0–17.0)
Lymphocytes Relative: 4 % — ABNORMAL LOW (ref 12–46)
Lymphs Abs: 0.3 10*3/uL — ABNORMAL LOW (ref 0.7–4.0)
MCH: 26.9 pg (ref 26.0–34.0)
MCHC: 32.2 g/dL (ref 30.0–36.0)
MCV: 83.5 fL (ref 78.0–100.0)
Monocytes Absolute: 0.8 10*3/uL (ref 0.1–1.0)
Monocytes Relative: 11 % (ref 3–12)
NEUTROS ABS: 6.4 10*3/uL (ref 1.7–7.7)
Neutrophils Relative %: 84 % — ABNORMAL HIGH (ref 43–77)
PLATELETS: 481 10*3/uL — AB (ref 150–400)
RBC: 3.16 MIL/uL — ABNORMAL LOW (ref 4.22–5.81)
RDW: 16.6 % — AB (ref 11.5–15.5)
WBC: 7.7 10*3/uL (ref 4.0–10.5)

## 2014-11-08 LAB — COMPREHENSIVE METABOLIC PANEL
ALK PHOS: 90 U/L (ref 38–126)
ALT: 10 U/L — AB (ref 17–63)
AST: 14 U/L — ABNORMAL LOW (ref 15–41)
Albumin: 2.2 g/dL — ABNORMAL LOW (ref 3.5–5.0)
Anion gap: 8 (ref 5–15)
BILIRUBIN TOTAL: 0.5 mg/dL (ref 0.3–1.2)
BUN: 12 mg/dL (ref 6–20)
CO2: 24 mmol/L (ref 22–32)
Calcium: 8.5 mg/dL — ABNORMAL LOW (ref 8.9–10.3)
Chloride: 104 mmol/L (ref 101–111)
Creatinine, Ser: 1.03 mg/dL (ref 0.61–1.24)
GFR calc Af Amer: >60 mL/min (ref >60)
GFR calc non Af Amer: >60 mL/min (ref >60)
Glucose, Bld: 94 mg/dL (ref 65–99)
POTASSIUM: 3.8 mmol/L (ref 3.5–5.1)
SODIUM: 136 mmol/L (ref 135–145)
Total Protein: 6.1 g/dL — ABNORMAL LOW (ref 6.5–8.1)

## 2014-11-08 MED ORDER — CETYLPYRIDINIUM CHLORIDE 0.05 % MT LIQD
7.0000 mL | Freq: Two times a day (BID) | OROMUCOSAL | Status: DC
  Administered 2014-11-08: 7 mL via OROMUCOSAL

## 2014-11-08 MED ORDER — CHLORPROMAZINE HCL 25 MG/ML IJ SOLN: 25.0000 mg | Freq: Once | INTRAMUSCULAR | Status: DC

## 2014-11-08 MED ORDER — SODIUM CHLORIDE 0.9 % IV SOLN
1.0000 g | INTRAVENOUS | Status: DC
  Administered 2014-11-08: 1 g via INTRAVENOUS
  Filled 2014-11-08: qty 1

## 2014-11-08 MED ORDER — ENOXAPARIN SODIUM 40 MG/0.4ML ~~LOC~~ SOLN
40.0000 mg | SUBCUTANEOUS | Status: DC
  Filled 2014-11-08 (×3): qty 0.4

## 2014-11-08 MED ORDER — TRAZODONE HCL 50 MG PO TABS
50.0000 mg | ORAL_TABLET | Freq: Every day | ORAL | Status: DC
  Administered 2014-11-09: 50 mg via ORAL
  Filled 2014-11-08 (×4): qty 1

## 2014-11-08 MED ORDER — CHLORHEXIDINE GLUCONATE 0.12 % MT SOLN
15.0000 mL | Freq: Two times a day (BID) | OROMUCOSAL | Status: DC
  Administered 2014-11-08 – 2014-11-09 (×2): 15 mL via OROMUCOSAL
  Filled 2014-11-08 (×7): qty 15

## 2014-11-08 MED ORDER — SODIUM CHLORIDE 0.9 % IV SOLN
1.0000 g | INTRAVENOUS | Status: DC
  Administered 2014-11-09 – 2014-11-10 (×2): 1 g via INTRAVENOUS
  Filled 2014-11-08 (×2): qty 1

## 2014-11-08 MED ORDER — SODIUM CHLORIDE 0.9 % IV SOLN
1.0000 g | INTRAVENOUS | Status: DC
  Filled 2014-11-08: qty 1

## 2014-11-08 NOTE — Progress Notes (Signed)
Initial Nutrition Assessment  DOCUMENTATION CODES:   Non-severe (moderate) malnutrition in context of chronic illness  INTERVENTION:  - RD will continue to monitor for needs with diet advancement versus need for nutrition support  NUTRITION DIAGNOSIS:   Inadequate oral intake related to inability to eat as evidenced by NPO status.  GOAL:   Patient will meet greater than or equal to 90% of their needs  MONITOR:   Diet advancement, Weight trends, Labs, I & O's  REASON FOR ASSESSMENT:   Malnutrition Screening Tool    ASSESSMENT:   69 y.o. 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 He denies fever, chills, diarrhea (he has had 2 formed stools bowel movements), sick contacts or travel history. He has a history of 2 previous small bowel obstruction episodes after his extensive surgery for sarcoma removal was performed. On both previous episodes, these have resolved spontaneously  Pt seen for MST. BMI indicates normal weight status. Pt NPO and unable to meet needs. Surgery following pt; will continue to monitor for surgical team's POC/recommendations.   NGT in place to suction with ~200cc drainage noted at time of visit which is light brown-ish green with precipitate present at the bottom of canister.  Pt reports that PTA he ate and drank a wide variety of items but he noticed that he was getting full quickly and eating ~1/2 as much as his wife and son at meals. Pt is unsure of when this began to occur. He states he had nausea with intakes and for the past few days has had emesis as well with all meal intakes.  He indicates that he has been losing weight since last admission but unable  to quantify amount of weight loss. Per weight hx review, pt has lost  13 lbs (7% body weight) in the past 1 month which is significant for time frame.  Pt refused physical assessment; he may meet criteria for severe malnutrition but unable to confirm without first doing assessment.   Medications reviewed. Labs reviewed; Ca: 8.5 mg/dL.    Diet Order:  Diet NPO time specified Except for: Sips with Meds  Skin:  Reviewed, no issues  Last BM:  PTA  Height:   Ht Readings from Last 1 Encounters:  11/07/14 5\' 8"  (1.727 m)    Weight:   Wt Readings from Last 1 Encounters:  11/07/14 161 lb 12.8 oz (73.392 kg)    Ideal Body Weight:  70 kg (kg)  BMI:  Body mass index is 24.61 kg/(m^2).  Estimated Nutritional Needs:   Kcal:  3007-6226  Protein:  110-120 grams  Fluid:  2.2-2.5 L/day  EDUCATION NEEDS:   No education needs identified at this time     Jarome Matin, RD, LDN Inpatient Clinical Dietitian Pager # [phone] After hours/weekend pager # [phone]

## 2014-11-08 NOTE — Progress Notes (Addendum)
[facility] Progress Note   Darryl Griffith ZTI:[id number] DOB: 1945-12-03 DOA: 11/07/2014 PCP: Gennette Pac, MD  Brief narrative: Darryl Griffith is a 69 y.o. 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 Has had 2 episodes of SBO in the past with spontaneous resolution. He presents with nausea and vomiting and is found to have a bowel obstruction. NG tube has been placed and surgery is following and managing.   Subjective: No complaints of abdominal pain or nausea. Had one bowel movement this morning  Assessment/Plan: Principal Problem:   SBO (small bowel obstruction) --Management per surgery-  Active Problems:  Abdominal abscessed - advised by Dr Johnnye Sima  to continue Invanz for 3 wks- last dose will be tomorrow but per patient, he was due to go to the office and be re-assessed prior to stopping antibiotics - will d/c with Dr Johnnye Sima    Hyponatremia -Resolved--continue maintenance fluids    Malnutrition of moderate degree -Will need supplements when able to take orals   Code Status: Full code Family Communication:  DVT prophylaxis: Lovenox Consultants: An oral surgery Procedures:  Antibiotics: Anti-infectives    Start     Dose/Rate Route Frequency Ordered Stop   11/08/14 1000  ertapenem (INVANZ) 1 g in sodium chloride 0.9 % 50 mL IVPB     1 g 100 mL/hr over 30 Minutes Intravenous Every 24 hours 11/08/14 0308        Objective: Filed Weights   11/07/14 2212  Weight: 73.392 kg (161 lb 12.8 oz)    Intake/Output Summary (Last 24 hours) at 11/08/14 1412 Last data filed at 11/08/14 1200  Gross per 24 hour  Intake    360 ml  Output   1100 ml  Net   -740 ml     Vitals Filed Vitals:   11/07/14 1954 11/07/14 2130 11/07/14 2212 11/08/14 0524  BP: 131/74 137/76 134/66 123/81  Pulse: 99 100 94 97  Temp: 98.9 F (37.2 C)  98.5 F (36.9 C) 98.3  F (36.8 C)  TempSrc: Oral  Oral Oral  Resp: 23 17 18 18   Height:   5\' 8"  (1.727 m)   Weight:   73.392 kg (161 lb 12.8 oz)   SpO2: 100% 99% 100% 97%    Exam:  General:  Pt is alert, not in acute distress  HEENT: No icterus, No thrush, oral mucosa moist  Cardiovascular: regular rate and rhythm, S1/S2 No murmur  Respiratory: clear to auscultation bilaterally   Abdomen: Soft, faint Bowel sounds heard when NG tube clamped off, non tender, non distended, no guarding  MSK: No LE edema, cyanosis or clubbing  Data Reviewed: Basic Metabolic Panel:  Recent Labs Lab 11/07/14 1740 11/08/14 0535  NA 132* 136  K 4.0 3.8  CL 96* 104  CO2 24 24  GLUCOSE 117* 94  BUN 16 12  CREATININE 1.20 1.03  CALCIUM 9.1 8.5*   Liver Function Tests:  Recent Labs Lab 11/07/14 1740 11/08/14 0535  AST 21 14*  ALT 14* 10*  ALKPHOS 107 90  BILITOT 0.2* 0.5  PROT 7.5 6.1*  ALBUMIN 2.9* 2.2*    Recent Labs Lab 11/07/14 1740  LIPASE 41   No results for input(s): AMMONIA in the last 168 hours. CBC:  Recent Labs Lab 11/07/14 1740 11/08/14 0535  WBC 12.2* 7.7  NEUTROABS 11.0* 6.4  HGB 10.1* 8.5*  HCT 31.4* 26.4*  MCV 83.7 83.5  PLT 591* 481*  Cardiac Enzymes: No results for input(s): CKTOTAL, CKMB, CKMBINDEX, TROPONINI in the last 168 hours. BNP (last 3 results) No results for input(s): BNP in the last 8760 hours.  ProBNP (last 3 results) No results for input(s): PROBNP in the last 8760 hours.  CBG: No results for input(s): GLUCAP in the last 168 hours.  No results found for this or any previous visit (from the past 240 hour(s)).   Studies: Ct Abdomen Pelvis W Contrast  11/07/2014   CLINICAL DATA:  Acute epigastric abdominal pain, vomiting.  EXAM: CT ABDOMEN AND PELVIS WITH CONTRAST  TECHNIQUE: Multidetector CT imaging of the abdomen and pelvis was performed using the standard protocol following bolus administration of intravenous contrast.  CONTRAST:  55mL OMNIPAQUE  IOHEXOL 300 MG/ML SOLN, 193mL OMNIPAQUE IOHEXOL 300 MG/ML SOLN  COMPARISON:  CT scan of October 31, 2014.  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 Minimal left posterior basilar subsegmental atelectasis is noted.  No gallstones are noted. The liver and pancreas appear normal. Spleen is unchanged. Left retroperitoneal drainage catheter noted on prior exam has been removed. The fluid collection appears to have significantly enlarged compared to prior exam, now measuring 5.6 x 2.6 cm. This is consistent with worsening abscess. Surgical clips are noted in the left retroperitoneal region which are unchanged compared to prior exam. Status post left nephrectomy. Right adrenal gland and kidney appear normal. Left adrenal gland is not clearly visualized. Stable 3.3 x 2.7 cm lesser sac fluid collection is noted. 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 There is significantly increased small bowel dilatation with transition zone seen in the left upper quadrant in area of previously described surgery, best seen on image number 37 of series 2. More distal small bowel is normal in caliber. Sigmoid diverticulosis is noted without inflammation. Urinary bladder appears normal. No significant adenopathy is noted.  IMPRESSION: Extensive postsurgical changes are noted in left upper quadrant consistent with prior left nephrectomy. There is significantly increased proximal small bowel dilatation with transition zone seen in the left upper quadrant adjacent to postoperative changes, consistent with small bowel obstruction, most likely due to adhesion or scarring.  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  Left retroperitoneal drainage catheter noted on prior exam has been removed in the interval. Significantly increased fluid collection containing gas is seen in this area consistent with abscess.  Stable abscess or fluid  collection is seen in the lesser sac.   Electronically Signed   By: Marijo Conception, M.D.   On: 11/07/2014 19:37   Dg Abd Portable 1v-small Bowel Obstruction Protocol-initial, 8 Hr Delay  11/08/2014   CLINICAL DATA:  Small bowel obstruction protocol. Contrast administered at 0345 hours.  EXAM: PORTABLE ABDOMEN - 1 VIEW  COMPARISON:  CT and radiographs 11/07/2014.  FINDINGS: 1156 hours. Nasogastric tube is visualized within the gastric fundus. There is a small amount of contrast within the stomach and distal small bowel which remains dilated. Contrast is also present within the colon, extending to the rectum, increased in density from yesterday, implying that some of the recently administered contrast has passed into the colon. There is still moderate diffuse small bowel distension. Multiple left abdominal surgical clips are noted. No evidence of free intraperitoneal air or contrast extravasation.  IMPRESSION: Persistent moderate small bowel distension in excess of the colon. However, the volume of contrast in the colon has increased from yesterday's radiographs, implying some passage of contrast through the small  bowel.   Electronically Signed   By: Richardean Sale M.D.   On: 11/08/2014 12:31   Dg Abd Portable 1v-small Bowel Protocol-position Verification  11/08/2014   CLINICAL DATA:  Nasogastric tube placement  EXAM: PORTABLE ABDOMEN - 1 VIEW  COMPARISON:  CT abdomen and pelvis November 07, 2014  FINDINGS: Nasogastric tube tip and side port are in the stomach. There are loops of dilated bowel indicating a degree of obstruction. No free air apparent. There are multiple surgical clips in the mid abdomen.  IMPRESSION: Nasogastric tube tip and side port in stomach. Dilated loops of bowel consistent with a degree of obstruction.   Electronically Signed   By: Lowella Grip III M.D.   On: 11/08/2014 01:12    Scheduled Meds:  Scheduled Meds: . antiseptic oral rinse  7 mL Mouth Rinse q12n4p  . bisacodyl  10 mg  Rectal Daily  . chlorhexidine  15 mL Mouth Rinse BID  . chlorproMAZINE (THORAZINE) injection  25 mg Intramuscular Once  . enoxaparin (LOVENOX) injection  40 mg Subcutaneous Q24H  . ertapenem (INVANZ) IV  1 g Intravenous Q24H  . lip balm  1 application Topical BID  . traZODone  50 mg Oral QHS   Continuous Infusions: . sodium chloride 1,000 mL (11/07/14 1822)    Time spent on care of this patient: 73 min   [place], MD 11/08/2014, 2:12 PM  LOS: 1 day   [facility] Office  [phone] Pager - Text Page per [email or web address] If 7PM-7AM, please contact night-coverage [email or web address]

## 2014-11-08 NOTE — Progress Notes (Signed)
Brief support with Mr Patras at bedside.  Introduced spiritual care as resource.

## 2014-11-08 NOTE — Progress Notes (Signed)
[facility]  Patient Status: Active (receiving services up to time of hospitalization)  [facility] is providing the following services: RN and IV abx  If patient discharges after hours, please call [phone].   Darryl Griffith 11/08/2014, 3:03 PM

## 2014-11-08 NOTE — Care Management Note (Signed)
Case Management Note  Patient Details  Name: Darryl Griffith MRN: [id number] Date of Birth: [birth year]  Subjective/Objective:                 sbo   Action/Plan: Date:  November 08, 2014 U.R. performed for needs and level of care. Will continue to follow for Case Management needs.  Velva Harman, RN, BSN, [place]   [phone]  Expected Discharge Date:   (unknown)               Expected Discharge Plan:  Home/Self Care  In-House Referral:  NA  Discharge planning Services  CM Consult  Post Acute Care Choice:  NA Choice offered to:  NA  DME Arranged:  N/A DME Agency:  NA  HH Arranged:  NA HH Agency:  NA  Status of Service:  In process, will continue to follow  Medicare Important Message Given:    Date Medicare IM Given:    Medicare IM give by:    Date Additional Medicare IM Given:    Additional Medicare Important Message give by:     If discussed at [place] of Stay Meetings, dates discussed:    Additional Comments:  Leeroy Cha, RN 11/08/2014, 10:29 AM

## 2014-11-08 NOTE — Progress Notes (Signed)
Subjective: Passing some gas.  NGT output bilious.   Objective: Vital signs in last 24 hours: Temp:  [98.3 F (36.8 C)-98.9 F (37.2 C)] 98.3 F (36.8 C) (08/10 0524) Pulse Rate:  [94-106] 97 (08/10 0524) Resp:  [17-23] 18 (08/10 0524) BP: (110-137)/(66-91) 123/81 mmHg (08/10 0524) SpO2:  [97 %-100 %] 97 % (08/10 0524) Weight:  [73.392 kg (161 lb 12.8 oz)] 73.392 kg (161 lb 12.8 oz) (08/09 2212)    Intake/Output from previous day: 08/09 0701 - 08/10 0700 In: 240  Out: 1100 [Urine:850; Emesis/NG output:250] Intake/Output this shift: Total I/O In: 60 [Other:60] Out: -   General appearance: alert, cooperative and no distress Resp: breathing comfortably GI: sfot, non distended, non tender  Lab Results:   Recent Labs  11/07/14 1740 11/08/14 0535  WBC 12.2* 7.7  HGB 10.1* 8.5*  HCT 31.4* 26.4*  PLT 591* 481*   BMET  Recent Labs  11/07/14 1740 11/08/14 0535  NA 132* 136  K 4.0 3.8  CL 96* 104  CO2 24 24  GLUCOSE 117* 94  BUN 16 12  CREATININE 1.20 1.03  CALCIUM 9.1 8.5*   PT/INR No results for input(s): LABPROT, INR in the last 72 hours. ABG No results for input(s): PHART, HCO3 in the last 72 hours.  Invalid input(s): PCO2, PO2  Studies/Results: Ct Abdomen Pelvis W Contrast  11/07/2014   CLINICAL DATA:  Acute epigastric abdominal pain, vomiting.  EXAM: CT ABDOMEN AND PELVIS WITH CONTRAST  TECHNIQUE: Multidetector CT imaging of the abdomen and pelvis was performed using the standard protocol following bolus administration of intravenous contrast.  CONTRAST:  60mL OMNIPAQUE IOHEXOL 300 MG/ML SOLN, 175mL OMNIPAQUE IOHEXOL 300 MG/ML SOLN  COMPARISON:  CT scan of October 31, 2014.  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 Minimal left posterior basilar subsegmental atelectasis is noted.  No gallstones are noted. The liver and pancreas appear normal. Spleen is unchanged. Left retroperitoneal drainage catheter noted  on prior exam has been removed. The fluid collection appears to have significantly enlarged compared to prior exam, now measuring 5.6 x 2.6 cm. This is consistent with worsening abscess. Surgical clips are noted in the left retroperitoneal region which are unchanged compared to prior exam. Status post left nephrectomy. Right adrenal gland and kidney appear normal. Left adrenal gland is not clearly visualized. Stable 3.3 x 2.7 cm lesser sac fluid collection is noted. 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 There is significantly increased small bowel dilatation with transition zone seen in the left upper quadrant in area of previously described surgery, best seen on image number 37 of series 2. More distal small bowel is normal in caliber. Sigmoid diverticulosis is noted without inflammation. Urinary bladder appears normal. No significant adenopathy is noted.  IMPRESSION: Extensive postsurgical changes are noted in left upper quadrant consistent with prior left nephrectomy. There is significantly increased proximal small bowel dilatation with transition zone seen in the left upper quadrant adjacent to postoperative changes, consistent with small bowel obstruction, most likely due to adhesion or scarring.  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  Left retroperitoneal drainage catheter noted on prior exam has been removed in the interval. Significantly increased fluid collection containing gas is seen in this area consistent with abscess.  Stable abscess or fluid collection is seen in the lesser sac.   Electronically Signed   By: Marijo Conception, M.D.   On: 11/07/2014 19:37   Dg Abd  Portable 1v-small Bowel Protocol-position Verification  11/08/2014   CLINICAL DATA:  Nasogastric tube placement  EXAM: PORTABLE ABDOMEN - 1 VIEW  COMPARISON:  CT abdomen and pelvis November 07, 2014  FINDINGS: Nasogastric tube tip and side port are in the stomach. There are  loops of dilated bowel indicating a degree of obstruction. No free air apparent. There are multiple surgical clips in the mid abdomen.  IMPRESSION: Nasogastric tube tip and side port in stomach. Dilated loops of bowel consistent with a degree of obstruction.   Electronically Signed   By: Lowella Grip III M.D.   On: 11/08/2014 01:12    Anti-infectives: Anti-infectives    Start     Dose/Rate Route Frequency Ordered Stop   11/08/14 1000  ertapenem (INVANZ) 1 g in sodium chloride 0.9 % 50 mL IVPB     1 g 100 mL/hr over 30 Minutes Intravenous Every 24 hours 11/08/14 0308        Assessment/Plan: s/p * No surgery found * p sbo  ngt Npo Small bowel protocol pending.  Passing some gas already.  This is hopeful.    LOS: 1 day    [facility] 11/08/2014

## 2014-11-09 ENCOUNTER — Inpatient Hospital Stay: Payer: Medicare Other | Admitting: Internal Medicine

## 2014-11-09 DIAGNOSIS — T814XXD Infection following a procedure, subsequent encounter: Secondary | ICD-10-CM

## 2014-11-09 DIAGNOSIS — K651 Peritoneal abscess: Secondary | ICD-10-CM

## 2014-11-09 LAB — COMPREHENSIVE METABOLIC PANEL
ALK PHOS: 86 U/L (ref 38–126)
ALT: 10 U/L — AB (ref 17–63)
AST: 15 U/L (ref 15–41)
Albumin: 2.3 g/dL — ABNORMAL LOW (ref 3.5–5.0)
Anion gap: 11 (ref 5–15)
BUN: 13 mg/dL (ref 6–20)
CO2: 21 mmol/L — ABNORMAL LOW (ref 22–32)
Calcium: 8.4 mg/dL — ABNORMAL LOW (ref 8.9–10.3)
Chloride: 105 mmol/L (ref 101–111)
Creatinine, Ser: 1.07 mg/dL (ref 0.61–1.24)
GFR calc Af Amer: >60 mL/min (ref >60)
GFR calc non Af Amer: >60 mL/min (ref >60)
GLUCOSE: 78 mg/dL (ref 65–99)
POTASSIUM: 3.7 mmol/L (ref 3.5–5.1)
SODIUM: 137 mmol/L (ref 135–145)
TOTAL PROTEIN: 5.7 g/dL — AB (ref 6.5–8.1)
Total Bilirubin: 0.5 mg/dL (ref 0.3–1.2)

## 2014-11-09 LAB — FUNGUS CULTURE W SMEAR
Fungal Smear: NONE SEEN
Special Requests: NORMAL

## 2014-11-09 LAB — CBC WITH DIFFERENTIAL/PLATELET
Basophils Absolute: 0 10*3/uL (ref 0.0–0.1)
Basophils Relative: 0 % (ref 0–1)
EOS ABS: 0.1 10*3/uL (ref 0.0–0.7)
Eosinophils Relative: 2 % (ref 0–5)
HCT: 26.9 % — ABNORMAL LOW (ref 39.0–52.0)
Hemoglobin: 8.7 g/dL — ABNORMAL LOW (ref 13.0–17.0)
Lymphocytes Relative: 7 % — ABNORMAL LOW (ref 12–46)
Lymphs Abs: 0.7 10*3/uL (ref 0.7–4.0)
MCH: 27.3 pg (ref 26.0–34.0)
MCHC: 32.3 g/dL (ref 30.0–36.0)
MCV: 84.3 fL (ref 78.0–100.0)
Monocytes Absolute: 0.4 10*3/uL (ref 0.1–1.0)
Monocytes Relative: 5 % (ref 3–12)
Neutro Abs: 7.5 10*3/uL (ref 1.7–7.7)
Neutrophils Relative %: 86 % — ABNORMAL HIGH (ref 43–77)
Platelets: 462 10*3/uL — ABNORMAL HIGH (ref 150–400)
RBC: 3.19 MIL/uL — AB (ref 4.22–5.81)
RDW: 16.7 % — ABNORMAL HIGH (ref 11.5–15.5)
WBC: 8.8 10*3/uL (ref 4.0–10.5)

## 2014-11-09 MED ORDER — LORAZEPAM 2 MG/ML IJ SOLN
0.5000 mg | Freq: Once | INTRAMUSCULAR | Status: AC
  Administered 2014-11-09: 0.5 mg via INTRAVENOUS
  Filled 2014-11-09: qty 1

## 2014-11-09 MED ORDER — SODIUM CHLORIDE 0.9 % IJ SOLN
10.0000 mL | Freq: Two times a day (BID) | INTRAMUSCULAR | Status: DC
  Administered 2014-11-09 (×2): 10 mL

## 2014-11-09 MED ORDER — SODIUM CHLORIDE 0.9 % IJ SOLN
10.0000 mL | INTRAMUSCULAR | Status: DC | PRN
  Administered 2014-11-09 – 2014-11-10 (×2): 20 mL
  Administered 2014-11-10: 10 mL
  Filled 2014-11-09 (×3): qty 40

## 2014-11-09 NOTE — Progress Notes (Signed)
Patient requesting ng tube removal.  Patient passing flatus, and has had 2 bowel movements today.  Patient tolerating sips of clears.  Darryl Griffith notified, with order to start patient on clear liquids, and discontinue ng tube.

## 2014-11-09 NOTE — Care Management Important Message (Signed)
Important Message  Patient Details  Name: Quadry Kampa MRN: [id number] Date of Birth: [birth year]   Medicare Important Message Given:  Yes-second notification given    Camillo Flaming 11/09/2014, 1:10 PMImportant Message  Patient Details  Name: Travor Royce MRN: [id number] Date of Birth: 25-Dec-1945   Medicare Important Message Given:  Yes-second notification given    Camillo Flaming 11/09/2014, 1:10 PM

## 2014-11-09 NOTE — Progress Notes (Addendum)
[facility] Progress Note   Darryl Griffith AJG:[id number] DOB: [birth year] DOA: 11/07/2014 PCP: Gennette Pac, MD  Brief narrative: Darryl Griffith is a 69 y.o. male with retroperitoneal liposarcoma status post resection in April 3559 complicated by intra-abdominal abscesses requiring percutaneous drain and IV antibiotics. Has had 2 episodes of SBO in the past with spontaneous resolution. He presents with nausea and vomiting and is found to have a bowel obstruction. NG tube has been placed and surgery is following and managing.   Subjective: Was incontinent of stool this morning. No abdominal pain or nausea. Was complaining of postnasal discharge yesterday but this has improved.  Assessment/Plan: Principal Problem:   SBO (small bowel obstruction) --Management per surgery-they're recommending clamping the tube and starting clear liquids  Active Problems:  Abdominal abscesses - advised by Dr Johnnye Sima  to continue Invanz for 3 wks means to stop date should be in the next few days- CT scan on 8/9 reveals fluid collection still present-will ask surgery and ID to further assess-may need to put in a drain once again    Hyponatremia -Resolved--continue maintenance fluids    Malnutrition of moderate degree -Will need supplements when able to take orals   Code Status: Full code Family Communication:  DVT prophylaxis: Lovenox Consultants: An oral surgery Procedures:  Antibiotics: Anti-infectives    Start     Dose/Rate Route Frequency Ordered Stop   11/09/14 1400  ertapenem (INVANZ) 1 g in sodium chloride 0.9 % 50 mL IVPB     1 g 100 mL/hr over 30 Minutes Intravenous Every 24 hours 11/08/14 1618     11/08/14 1500  ertapenem (INVANZ) 1 g in sodium chloride 0.9 % 50 mL IVPB  Status:  Discontinued     1 g 100 mL/hr over 30 Minutes Intravenous Every 24 hours 11/08/14 1437 11/08/14 1618   11/08/14 1000  ertapenem (INVANZ) 1 g in sodium chloride 0.9 % 50 mL IVPB  Status:  Discontinued      1 g 100 mL/hr over 30 Minutes Intravenous Every 24 hours 11/08/14 0308 11/08/14 1419      Objective: Filed Weights   11/07/14 2212  Weight: 73.392 kg (161 lb 12.8 oz)    Intake/Output Summary (Last 24 hours) at 11/09/14 1029 Last data filed at 11/09/14 0600  Gross per 24 hour  Intake   2985 ml  Output    200 ml  Net   2785 ml     Vitals Filed Vitals:   11/07/14 2212 11/08/14 0524 11/08/14 1500 11/08/14 2214  BP: 134/66 123/81 148/78 141/71  Pulse: 94 97 101 101  Temp: 98.5 F (36.9 C) 98.3 F (36.8 C) 98.4 F (36.9 C) 99 F (37.2 C)  TempSrc: Oral Oral Oral Oral  Resp: 18 18 16 16   Height: 5\' 8"  (1.727 m)     Weight: 73.392 kg (161 lb 12.8 oz)     SpO2: 100% 97% 98% 97%    Exam:  General:  Pt is alert, not in acute distress  HEENT: No icterus, No thrush, oral mucosa moist  Cardiovascular: regular rate and rhythm, S1/S2 No murmur  Respiratory: clear to auscultation bilaterally   Abdomen: Soft, active Bowel sounds heard, non tender, non distended, no guarding  MSK: No LE edema, cyanosis or clubbing  Data Reviewed: Basic Metabolic Panel:  Recent Labs Lab 11/07/14 1740 11/08/14 0535 11/09/14 0535  NA 132* 136 137  K 4.0 3.8 3.7  CL 96* 104 105  CO2 24 24 21*  GLUCOSE 117* 94 78  BUN 16 12 13   CREATININE 1.20 1.03 1.07  CALCIUM 9.1 8.5* 8.4*   Liver Function Tests:  Recent Labs Lab 11/07/14 1740 11/08/14 0535 11/09/14 0535  AST 21 14* 15  ALT 14* 10* 10*  ALKPHOS 107 90 86  BILITOT 0.2* 0.5 0.5  PROT 7.5 6.1* 5.7*  ALBUMIN 2.9* 2.2* 2.3*    Recent Labs Lab 11/07/14 1740  LIPASE 41   No results for input(s): AMMONIA in the last 168 hours. CBC:  Recent Labs Lab 11/07/14 1740 11/08/14 0535 11/09/14 0535  WBC 12.2* 7.7 8.8  NEUTROABS 11.0* 6.4 7.5  HGB 10.1* 8.5* 8.7*  HCT 31.4* 26.4* 26.9*  MCV 83.7 83.5 84.3  PLT 591* 481* 462*   Cardiac Enzymes: No results for input(s): CKTOTAL, CKMB, CKMBINDEX, TROPONINI in the last  168 hours. BNP (last 3 results) No results for input(s): BNP in the last 8760 hours.  ProBNP (last 3 results) No results for input(s): PROBNP in the last 8760 hours.  CBG: No results for input(s): GLUCAP in the last 168 hours.  No results found for this or any previous visit (from the past 240 hour(s)).   Studies: Ct Abdomen Pelvis W Contrast  11/07/2014   CLINICAL DATA:  Acute epigastric abdominal pain, vomiting.  EXAM: CT ABDOMEN AND PELVIS WITH CONTRAST  TECHNIQUE: Multidetector CT imaging of the abdomen and pelvis was performed using the standard protocol following bolus administration of intravenous contrast.  CONTRAST:  32mL OMNIPAQUE IOHEXOL 300 MG/ML SOLN, 168mL OMNIPAQUE IOHEXOL 300 MG/ML SOLN  COMPARISON:  CT scan of October 31, 2014.  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 Minimal left posterior basilar subsegmental atelectasis is noted.  No gallstones are noted. The liver and pancreas appear normal. Spleen is unchanged. Left retroperitoneal drainage catheter noted on prior exam has been removed. The fluid collection appears to have significantly enlarged compared to prior exam, now measuring 5.6 x 2.6 cm. This is consistent with worsening abscess. Surgical clips are noted in the left retroperitoneal region which are unchanged compared to prior exam. Status post left nephrectomy. Right adrenal gland and kidney appear normal. Left adrenal gland is not clearly visualized. Stable 3.3 x 2.7 cm lesser sac fluid collection is noted. 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 There is significantly increased small bowel dilatation with transition zone seen in the left upper quadrant in area of previously described surgery, best seen on image number 37 of series 2. More distal small bowel is normal in caliber. Sigmoid diverticulosis is noted without inflammation. Urinary bladder appears normal. No significant adenopathy is  noted.  IMPRESSION: Extensive postsurgical changes are noted in left upper quadrant consistent with prior left nephrectomy. There is significantly increased proximal small bowel dilatation with transition zone seen in the left upper quadrant adjacent to postoperative changes, consistent with small bowel obstruction, most likely due to adhesion or scarring.  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  Left retroperitoneal drainage catheter noted on prior exam has been removed in the interval. Significantly increased fluid collection containing gas is seen in this area consistent with abscess.  Stable abscess or fluid collection is seen in the lesser sac.   Electronically Signed   By: Marijo Conception, M.D.   On: 11/07/2014 19:37   Dg Abd Portable 1v-small Bowel Obstruction Protocol-initial, 8 Hr Delay  11/08/2014   CLINICAL DATA:  Small bowel obstruction protocol. Contrast administered at 0345 hours.  EXAM: PORTABLE ABDOMEN -  1 VIEW  COMPARISON:  CT and radiographs 11/07/2014.  FINDINGS: 1156 hours. Nasogastric tube is visualized within the gastric fundus. There is a small amount of contrast within the stomach and distal small bowel which remains dilated. Contrast is also present within the colon, extending to the rectum, increased in density from yesterday, implying that some of the recently administered contrast has passed into the colon. There is still moderate diffuse small bowel distension. Multiple left abdominal surgical clips are noted. No evidence of free intraperitoneal air or contrast extravasation.  IMPRESSION: Persistent moderate small bowel distension in excess of the colon. However, the volume of contrast in the colon has increased from yesterday's radiographs, implying some passage of contrast through the small bowel.   Electronically Signed   By: Richardean Sale M.D.   On: 11/08/2014 12:31   Dg Abd Portable 1v-small Bowel Protocol-position Verification  11/08/2014    CLINICAL DATA:  Nasogastric tube placement  EXAM: PORTABLE ABDOMEN - 1 VIEW  COMPARISON:  CT abdomen and pelvis November 07, 2014  FINDINGS: Nasogastric tube tip and side port are in the stomach. There are loops of dilated bowel indicating a degree of obstruction. No free air apparent. There are multiple surgical clips in the mid abdomen.  IMPRESSION: Nasogastric tube tip and side port in stomach. Dilated loops of bowel consistent with a degree of obstruction.   Electronically Signed   By: Lowella Grip III M.D.   On: 11/08/2014 01:12    Scheduled Meds:  Scheduled Meds: . antiseptic oral rinse  7 mL Mouth Rinse q12n4p  . bisacodyl  10 mg Rectal Daily  . chlorhexidine  15 mL Mouth Rinse BID  . chlorproMAZINE (THORAZINE) injection  25 mg Intramuscular Once  . enoxaparin (LOVENOX) injection  40 mg Subcutaneous Q24H  . ertapenem (INVANZ) IV  1 g Intravenous Q24H  . lip balm  1 application Topical BID  . traZODone  50 mg Oral QHS   Continuous Infusions: . sodium chloride 1,000 mL (11/07/14 1822)    Time spent on care of this patient: 35 min   [place], MD 11/09/2014, 10:29 AM  LOS: 2 days   [facility] Office  [phone] Pager - Text Page per [email or web address] If 7PM-7AM, please contact night-coverage [email or web address]

## 2014-11-09 NOTE — Progress Notes (Signed)
He has had 2 BM's today and 2 yesterday, done well with NG clamped all day and asking to have tube removed.  I will d/c NG and give him some clears for tonight.

## 2014-11-09 NOTE — Progress Notes (Signed)
Subjective: Less distended, had 2 Bm's yesterday and loose uncontrolled stool now.  He got a suppository some time during this.  He says he is less distended.  They are getting ready to clean him up now.  Objective: Vital signs in last 24 hours: Temp:  [98.4 F (36.9 C)-99 F (37.2 C)] 99 F (37.2 C) (08/10 2214) Pulse Rate:  [101] 101 (08/10 2214) Resp:  [16] 16 (08/10 2214) BP: (141-148)/(71-78) 141/71 mmHg (08/10 2214) SpO2:  [97 %-98 %] 97 % (08/10 2214)  200 from NG recorded NPO 2 stools Afebrile, VSS Labs OK H/H down some Film yesterday showed contrast in the colon Intake/Output from previous day: 08/10 0701 - 08/11 0700 In: 3045 [I.V.:2875; NG/GT:50] Out: 200 [Emesis/NG output:200] Intake/Output this shift:    General appearance: alert, cooperative, no distress and tired GI: soft, no significant distension, few BS, BM x 3 now.  Lab Results:   Recent Labs  11/08/14 0535 11/09/14 0535  WBC 7.7 8.8  HGB 8.5* 8.7*  HCT 26.4* 26.9*  PLT 481* 462*    BMET  Recent Labs  11/08/14 0535 11/09/14 0535  NA 136 137  K 3.8 3.7  CL 104 105  CO2 24 21*  GLUCOSE 94 78  BUN 12 13  CREATININE 1.03 1.07  CALCIUM 8.5* 8.4*   PT/INR No results for input(s): LABPROT, INR in the last 72 hours.   Recent Labs Lab 11/07/14 1740 11/08/14 0535 11/09/14 0535  AST 21 14* 15  ALT 14* 10* 10*  ALKPHOS 107 90 86  BILITOT 0.2* 0.5 0.5  PROT 7.5 6.1* 5.7*  ALBUMIN 2.9* 2.2* 2.3*     Lipase     Component Value Date/Time   LIPASE 41 11/07/2014 1740     Studies/Results: Ct Abdomen Pelvis W Contrast  11/07/2014   CLINICAL DATA:  Acute epigastric abdominal pain, vomiting.  EXAM: CT ABDOMEN AND PELVIS WITH CONTRAST  TECHNIQUE: Multidetector CT imaging of the abdomen and pelvis was performed using the standard protocol following bolus administration of intravenous contrast.  CONTRAST:  67mL OMNIPAQUE IOHEXOL 300 MG/ML SOLN, 140mL OMNIPAQUE IOHEXOL 300 MG/ML SOLN   COMPARISON:  CT scan of October 31, 2014.  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 Minimal left posterior basilar subsegmental atelectasis is noted.  No gallstones are noted. The liver and pancreas appear normal. Spleen is unchanged. Left retroperitoneal drainage catheter noted on prior exam has been removed. The fluid collection appears to have significantly enlarged compared to prior exam, now measuring 5.6 x 2.6 cm. This is consistent with worsening abscess. Surgical clips are noted in the left retroperitoneal region which are unchanged compared to prior exam. Status post left nephrectomy. Right adrenal gland and kidney appear normal. Left adrenal gland is not clearly visualized. Stable 3.3 x 2.7 cm lesser sac fluid collection is noted. 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 There is significantly increased small bowel dilatation with transition zone seen in the left upper quadrant in area of previously described surgery, best seen on image number 37 of series 2. More distal small bowel is normal in caliber. Sigmoid diverticulosis is noted without inflammation. Urinary bladder appears normal. No significant adenopathy is noted.  IMPRESSION: Extensive postsurgical changes are noted in left upper quadrant consistent with prior left nephrectomy. There is significantly increased proximal small bowel dilatation with transition zone seen in the left upper quadrant adjacent to postoperative changes, consistent with small bowel obstruction, most likely due to  adhesion or scarring.  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  Left retroperitoneal drainage catheter noted on prior exam has been removed in the interval. Significantly increased fluid collection containing gas is seen in this area consistent with abscess.  Stable abscess or fluid collection is seen in the lesser sac.   Electronically Signed   By: Marijo Conception, M.D.   On: 11/07/2014 19:37   Dg Abd Portable 1v-small Bowel Obstruction Protocol-initial, 8 Hr Delay  11/08/2014   CLINICAL DATA:  Small bowel obstruction protocol. Contrast administered at 0345 hours.  EXAM: PORTABLE ABDOMEN - 1 VIEW  COMPARISON:  CT and radiographs 11/07/2014.  FINDINGS: 1156 hours. Nasogastric tube is visualized within the gastric fundus. There is a small amount of contrast within the stomach and distal small bowel which remains dilated. Contrast is also present within the colon, extending to the rectum, increased in density from yesterday, implying that some of the recently administered contrast has passed into the colon. There is still moderate diffuse small bowel distension. Multiple left abdominal surgical clips are noted. No evidence of free intraperitoneal air or contrast extravasation.  IMPRESSION: Persistent moderate small bowel distension in excess of the colon. However, the volume of contrast in the colon has increased from yesterday's radiographs, implying some passage of contrast through the small bowel.   Electronically Signed   By: Richardean Sale M.D.   On: 11/08/2014 12:31   Dg Abd Portable 1v-small Bowel Protocol-position Verification  11/08/2014   CLINICAL DATA:  Nasogastric tube placement  EXAM: PORTABLE ABDOMEN - 1 VIEW  COMPARISON:  CT abdomen and pelvis November 07, 2014  FINDINGS: Nasogastric tube tip and side port are in the stomach. There are loops of dilated bowel indicating a degree of obstruction. No free air apparent. There are multiple surgical clips in the mid abdomen.  IMPRESSION: Nasogastric tube tip and side port in stomach. Dilated loops of bowel consistent with a degree of obstruction.   Electronically Signed   By: Lowella Grip III M.D.   On: 11/08/2014 01:12    Medications: . antiseptic oral rinse  7 mL Mouth Rinse q12n4p  . bisacodyl  10 mg Rectal Daily  . chlorhexidine  15 mL Mouth Rinse BID  . chlorproMAZINE (THORAZINE) injection   25 mg Intramuscular Once  . enoxaparin (LOVENOX) injection  40 mg Subcutaneous Q24H  . ertapenem (INVANZ) IV  1 g Intravenous Q24H  . lip balm  1 application Topical BID  . 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 Radiation Rx 07/13/14-10/02/12 Hospitalized 7/13-22/16 with intraabdominal abscess/SBO/ anemia with transfusion/ 3 weeks of abx with follow up in ID clinic, Dr. Johnnye Sima Percutaneous drain placement for retroperitoneal abscess 10/12/14  Drain removed 10/31/14 PCM Hypertension Hx of angina  Antibiotics:  Invanz DVT:  Lovenox/SCD  Plan:  Clamp NG, start him on some sips and see how he does.  He's been thru so much I want to make sure he's doing OK before removing NG.    LOS: 2 days    Tieisha Darden 11/09/2014

## 2014-11-10 ENCOUNTER — Inpatient Hospital Stay (HOSPITAL_COMMUNITY): Payer: Medicare Other

## 2014-11-10 DIAGNOSIS — K651 Peritoneal abscess: Secondary | ICD-10-CM | POA: Insufficient documentation

## 2014-11-10 DIAGNOSIS — E440 Moderate protein-calorie malnutrition: Secondary | ICD-10-CM

## 2014-11-10 DIAGNOSIS — K6819 Other retroperitoneal abscess: Principal | ICD-10-CM

## 2014-11-10 DIAGNOSIS — I10 Essential (primary) hypertension: Secondary | ICD-10-CM

## 2014-11-10 DIAGNOSIS — K5669 Other intestinal obstruction: Secondary | ICD-10-CM

## 2014-11-10 DIAGNOSIS — Z905 Acquired absence of kidney: Secondary | ICD-10-CM

## 2014-11-10 LAB — COMPREHENSIVE METABOLIC PANEL
ALT: 11 U/L — ABNORMAL LOW (ref 17–63)
AST: 16 U/L (ref 15–41)
Albumin: 2.4 g/dL — ABNORMAL LOW (ref 3.5–5.0)
Alkaline Phosphatase: 89 U/L (ref 38–126)
Anion gap: 11 (ref 5–15)
BILIRUBIN TOTAL: 0.5 mg/dL (ref 0.3–1.2)
BUN: 9 mg/dL (ref 6–20)
CO2: 23 mmol/L (ref 22–32)
CREATININE: 1.02 mg/dL (ref 0.61–1.24)
Calcium: 8.7 mg/dL — ABNORMAL LOW (ref 8.9–10.3)
Chloride: 105 mmol/L (ref 101–111)
GFR calc Af Amer: >60 mL/min (ref >60)
GFR calc non Af Amer: >60 mL/min (ref >60)
Glucose, Bld: 88 mg/dL (ref 65–99)
Potassium: 3.8 mmol/L (ref 3.5–5.1)
Sodium: 139 mmol/L (ref 135–145)
Total Protein: 6.3 g/dL — ABNORMAL LOW (ref 6.5–8.1)

## 2014-11-10 LAB — CBC WITH DIFFERENTIAL/PLATELET
BASOS ABS: 0 10*3/uL (ref 0.0–0.1)
Basophils Relative: 0 % (ref 0–1)
EOS ABS: 0.2 10*3/uL (ref 0.0–0.7)
EOS PCT: 2 % (ref 0–5)
HCT: 28.1 % — ABNORMAL LOW (ref 39.0–52.0)
Hemoglobin: 8.7 g/dL — ABNORMAL LOW (ref 13.0–17.0)
Lymphocytes Relative: 7 % — ABNORMAL LOW (ref 12–46)
Lymphs Abs: 0.6 10*3/uL — ABNORMAL LOW (ref 0.7–4.0)
MCH: 25.9 pg — ABNORMAL LOW (ref 26.0–34.0)
MCHC: 31 g/dL (ref 30.0–36.0)
MCV: 83.6 fL (ref 78.0–100.0)
MONO ABS: 0.7 10*3/uL (ref 0.1–1.0)
MONOS PCT: 9 % (ref 3–12)
Neutro Abs: 6.4 10*3/uL (ref 1.7–7.7)
Neutrophils Relative %: 82 % — ABNORMAL HIGH (ref 43–77)
Platelets: 492 10*3/uL — ABNORMAL HIGH (ref 150–400)
RBC: 3.36 MIL/uL — AB (ref 4.22–5.81)
RDW: 16.8 % — AB (ref 11.5–15.5)
WBC: 7.7 10*3/uL (ref 4.0–10.5)

## 2014-11-10 LAB — PROTIME-INR
INR: 1.2 (ref 0.00–1.49)
PROTHROMBIN TIME: 15.3 s — AB (ref 11.6–15.2)

## 2014-11-10 MED ORDER — LORAZEPAM 0.5 MG PO TABS
0.5000 mg | ORAL_TABLET | Freq: Once | ORAL | Status: AC
  Administered 2014-11-10: 0.5 mg via ORAL
  Filled 2014-11-10: qty 1

## 2014-11-10 MED ORDER — MIDAZOLAM HCL 2 MG/2ML IJ SOLN
INTRAMUSCULAR | Status: AC
  Filled 2014-11-10: qty 6

## 2014-11-10 MED ORDER — MIDAZOLAM HCL 2 MG/2ML IJ SOLN
INTRAMUSCULAR | Status: AC | PRN
  Administered 2014-11-10 (×3): 1 mg via INTRAVENOUS

## 2014-11-10 MED ORDER — SODIUM CHLORIDE 0.9 % IV SOLN: 1.0000 g | Freq: Every day | INTRAVENOUS | Status: DC

## 2014-11-10 MED ORDER — ENSURE ENLIVE PO LIQD: 237.0000 mL | Freq: Two times a day (BID) | ORAL | Status: DC

## 2014-11-10 MED ORDER — ONDANSETRON 8 MG PO TBDP: 8.0000 mg | ORAL_TABLET | Freq: Three times a day (TID) | ORAL | Status: DC

## 2014-11-10 MED ORDER — SODIUM CHLORIDE 0.9 % IV SOLN
1000.0000 mL | INTRAVENOUS | Status: DC
  Administered 2014-11-10: 1000 mL via INTRAVENOUS

## 2014-11-10 MED ORDER — FENTANYL CITRATE (PF) 100 MCG/2ML IJ SOLN
INTRAMUSCULAR | Status: AC | PRN
  Administered 2014-11-10: 25 ug via INTRAVENOUS
  Administered 2014-11-10: 50 ug via INTRAVENOUS

## 2014-11-10 MED ORDER — ONDANSETRON HCL 8 MG PO TABS: 8.0000 mg | ORAL_TABLET | Freq: Three times a day (TID) | ORAL | Status: DC

## 2014-11-10 MED ORDER — HEPARIN SOD (PORK) LOCK FLUSH 100 UNIT/ML IV SOLN
250.0000 [IU] | INTRAVENOUS | Status: AC | PRN
  Administered 2014-11-10: 250 [IU]

## 2014-11-10 MED ORDER — FENTANYL CITRATE (PF) 100 MCG/2ML IJ SOLN
INTRAMUSCULAR | Status: AC
  Filled 2014-11-10: qty 4

## 2014-11-10 NOTE — Discharge Summary (Addendum)
Physician Discharge Summary  Darryl Griffith KCL:[id number] DOB: [birth year] DOA: 11/07/2014  PCP: Gennette Pac, MD  Admit date: 11/07/2014 Discharge date: 11/10/2014  Time spent: 60 minutes  Recommendations for Outpatient Follow-up:  1. F/u with ID in 2-3 wks - ID to arrange  Discharge Condition: stable Diet recommendation: low fiber diet  Discharge Diagnoses:  Principal Problem:   SBO (small bowel obstruction) Active Problems:   Hypertension   Hyponatremia   Hypoalbuminemia   Malnutrition of moderate degree   Abdominopelvic abscess   History of present illness:  Darryl Griffith is a 69 y.o. 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 Has had 2 episodes of SBO in the past with spontaneous resolution. He presents with nausea and vomiting and is found to have a bowel obstruction. NG tube has been placed and surgery is following and managing.  Hospital Course:  Principal Problem:  SBO (small bowel obstruction) --Management per surgery-per their note, small bowel obstruction has resolved- tolerating solid diet  Active Problems:  Abdominal abscesses - advised by Dr Johnnye Sima to continue Invanz for 3 wks means to stop date should be in the next few days- CT scan on 8/9 reveals fluid collection still present and increasing-appreciate ID eval-Dr. Baxter Flattery recommending percutaneous drain be replaced by IR, fluid sent to lab for culture and Gram stain, continue ertapenem for 3 more weeks, follow-up in office in 2-3 weeks-Dr. Drucilla Schmidt with follow-up on culture - IR has evaluated the patient and are recommending aspiration only and no drain- Per Dr Baxter Flattery, this should be sufficient   Hyponatremia -Resolved--    Malnutrition of moderate degree -Start supplements   Procedures:  IR guided aspiration of abdominal fluid  Consultations:  gen  surgery  ID  IR  Discharge Exam: Filed Weights   11/07/14 2212  Weight: 73.392 kg (161 lb 12.8 oz)   Filed Vitals:   11/10/14 1517  BP: 141/76  Pulse: 97  Temp:   Resp: 21    General: AAO x 3, no distress Cardiovascular: RRR, no murmurs  Respiratory: clear to auscultation bilaterally GI: soft, non-tender, non-distended, bowel sound positive  Discharge Instructions You were cared for by a hospitalist during your hospital stay. If you have any questions about your discharge medications or the care you received while you were in the hospital after you are discharged, you can call the unit and asked to speak with the hospitalist on call if the hospitalist that took care of you is not available. Once you are discharged, your primary care physician will handle any further medical issues. Please note that NO REFILLS for any discharge medications will be authorized once you are discharged, as it is imperative that you return to your primary care physician (or establish a relationship with a primary care physician if you do not have one) for your aftercare needs so that they can reassess your need for medications and monitor your lab values.      Discharge Instructions    Discharge instructions    Complete by:  As directed   Low fiber diet     Increase activity slowly    Complete by:  As directed             Medication List    TAKE these medications        chlorproMAZINE 25 MG tablet  Commonly known as:  THORAZINE  Take 1 tablet (25 mg total) by mouth 3 (three) times  daily as needed.     ertapenem 1 g in sodium chloride 0.9 % 50 mL  Inject 1 g into the vein daily after supper.     ferrous sulfate 325 (65 FE) MG tablet  TAKE 1 TABLET (325 MG TOTAL) BY MOUTH 2 (TWO) TIMES DAILY WITH A MEAL.     LORazepam 1 MG tablet  Commonly known as:  ATIVAN  Take 1 tablet by mouth 3 (three) times daily as needed for anxiety. Anxiety/nausea     Melatonin 5 MG Caps  Take 1 capsule by  mouth at bedtime.     multivitamin with minerals Tabs tablet  Take 1 tablet by mouth every morning.     ondansetron 8 MG disintegrating tablet  Commonly known as:  ZOFRAN ODT  Take 1 tablet (8 mg total) by mouth 3 (three) times daily before meals.     pantoprazole 40 MG tablet  Commonly known as:  PROTONIX  Take 1 tablet (40 mg total) by mouth daily.     traZODone 50 MG tablet  Commonly known as:  DESYREL  Take 50 mg by mouth at bedtime.       Allergies  Allergen Reactions  . Compazine [Prochlorperazine] Other (See Comments)    Made pt really lethargic, and sleepy      The results of significant diagnostics from this hospitalization (including imaging, microbiology, ancillary and laboratory) are listed below for reference.    Significant Diagnostic Studies: Ct Abdomen Pelvis W Contrast  11/07/2014   CLINICAL DATA:  Acute epigastric abdominal pain, vomiting.  EXAM: CT ABDOMEN AND PELVIS WITH CONTRAST  TECHNIQUE: Multidetector CT imaging of the abdomen and pelvis was performed using the standard protocol following bolus administration of intravenous contrast.  CONTRAST:  39m OMNIPAQUE IOHEXOL 300 MG/ML SOLN, 1066mOMNIPAQUE IOHEXOL 300 MG/ML SOLN  COMPARISON:  CT scan of October 31, 2014.  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 Minimal left posterior basilar subsegmental atelectasis is noted.  No gallstones are noted. The liver and pancreas appear normal. Spleen is unchanged. Left retroperitoneal drainage catheter noted on prior exam has been removed. The fluid collection appears to have significantly enlarged compared to prior exam, now measuring 5.6 x 2.6 cm. This is consistent with worsening abscess. Surgical clips are noted in the left retroperitoneal region which are unchanged compared to prior exam. Status post left nephrectomy. Right adrenal gland and kidney appear normal. Left adrenal gland is not clearly visualized. Stable 3.3 x  2.7 cm lesser sac fluid collection is noted. 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 There is significantly increased small bowel dilatation with transition zone seen in the left upper quadrant in area of previously described surgery, best seen on image number 37 of series 2. More distal small bowel is normal in caliber. Sigmoid diverticulosis is noted without inflammation. Urinary bladder appears normal. No significant adenopathy is noted.  IMPRESSION: Extensive postsurgical changes are noted in left upper quadrant consistent with prior left nephrectomy. There is significantly increased proximal small bowel dilatation with transition zone seen in the left upper quadrant adjacent to postoperative changes, consistent with small bowel obstruction, most likely due to adhesion or scarring.  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  Left retroperitoneal drainage catheter noted on prior exam has been removed in the interval. Significantly increased fluid collection containing gas is seen in this area consistent with abscess.  Stable abscess or fluid collection is seen in the lesser  sac.   Electronically Signed   By: Marijo Conception, M.D.   On: 11/07/2014 19:37   Ct Abdomen Pelvis W Contrast  10/31/2014   CLINICAL DATA:  Follow-up retroperitoneal abscess. 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  EXAM: CT ABDOMEN AND PELVIS WITH CONTRAST  TECHNIQUE: Multidetector CT imaging of the abdomen and pelvis was performed using the standard protocol following bolus administration of intravenous contrast.  CONTRAST:  155m ISOVUE-300 IOPAMIDOL (ISOVUE-300) INJECTION 61%  COMPARISON:  10/19/2014  FINDINGS: There is a small residual left pleural effusion, decreased from prior. Mild left basilar atelectasis is noted.  Subcentimeter low-density lesions in the right hepatic dome and near the gallbladder fossa are unchanged and  too small to characterize. The gallbladder, right adrenal gland, and pancreas are unremarkable. 1.2 cm low-density lesion in the right kidney is unchanged and compatible with a cyst. The left adrenal gland is not identified. Sequelae of left nephrectomy are again identified.  A left-sided retroperitoneal pigtail drainage catheter remains in place. A small amount of fluid and gas remain in the left upper quadrant adjacent to the drainage catheter and along the medial aspect of the spleen, similar to the prior study. Small abscess immediately inferior to the drainage catheter in the left psoas muscle measures approximately 19 x 11 mm, stable to minimally larger than on the prior study (series 3, image 34). Retroperitoneal soft tissue thickening/phlegmon extends anteriorly along the left aspect of the aorta and posterior to the pancreas, similar to prior.  Multiple small rim enhancing abscesses situated between the spleen and stomach do not appear significantly changed (series 3, image 19). Residual dominant fluid collection in the lesser sac measures 3.6 x 2.9 cm, unchanged (series 3, image 24). No new fluid collection is identified.  Small bowel dilatation on the prior study has largely resolved, without current evidence of high-grade bowel obstruction. A loop of small bowel remains borderline dilated and mildly thick walled in the left upper quadrant with a transition to decompressed small bowel near the drainage catheter. A few small bowel loops in the right mid abdomen remain borderline dilated measuring up to 3 cm in size, improved from prior.  There is mild wall thickening versus underdistention of multiple small bowel loops in the lower abdomen/ pelvis. There is persistent, mild to moderate wall thickening involving a portion of the distal left transverse colon which is inseparable to the 3.6 cm abscess described above. There is also some persistent wall thickening of the proximal descending colon which is in  close proximity to the abscess drain. Sigmoid colonic diverticulosis is again seen with persistent, mild pericolonic stranding in this region, not significantly changed.  Moderate aortoiliac atherosclerotic calcification is noted. Bladder is unremarkable. There is at most trace free fluid in the pelvis. Trace perihepatic fluid on the prior study has resolved. Moderate to severe lower lumbar disc degeneration and facet arthrosis are noted.  IMPRESSION: 1. Unchanged 3.6 cm dominant lesser sac abscess with unchanged appearance of adjacent smaller abscesses. 2. Small amount of fluid and gas near the retroperitoneal drainage catheter, similar to prior. 1.9 cm left psoas abscess is stable to at most minimally larger. 3. Improved/resolved small bowel obstruction. A few borderline dilated loops remain, and persistent partial obstruction is not excluded. 4. Persistent wall thickening of distal transverse and proximal descending colon near the abscesses. 5. No new collection.   Electronically Signed   By: ALogan Bores  On: 10/31/2014 16:29   Ct Abdomen  Pelvis W Contrast  10/19/2014   CLINICAL DATA:  69 year-old male who underwent a left nephrectomyand excision of retroperitoneal liposarcoma on 0/25/4270 complicated by development of a retroperitonealabscess. Percutaneous drain placed on 08/15/2014 , and drainage catheter removed on 09/26/2014. Recent presentation with nausea, vomiting and bloating and increased left retroperitoneal abscess with multiple smaller abscesses which communicate with the larger one and a small bowel obstruction with transition zone in the left upper abdomen. Status post repeat percutaneous left abdominal abscess drain placement on 10/12/2014.  Subsequent encounter.  EXAM: CT ABDOMEN AND PELVIS WITH CONTRAST  TECHNIQUE: Multidetector CT imaging of the abdomen and pelvis was performed using the standard protocol following bolus administration of intravenous contrast.  CONTRAST:  26m OMNIPAQUE  IOHEXOL 300 MG/ML  SOLN  COMPARISON:  Abdominal radiographs 10/16/2014 and earlier. CT drain placement 10/12/2014. CT Abdomen and Pelvis 10/12/2014, and earlier  FINDINGS: Stable small left pleural effusion. No pericardial effusion. Stable mild bibasilar atelectasis.  Stable visualized osseous structures.  Stable trace pelvic free fluid. Negative rectum. Negative urinary bladder.  Stable an mildly indistinct appearance of the sigmoid colon with underlying chronic diverticulosis.  The left colon now is decompressed. A portion of the left colon passes in proximity to the left retroperitoneal abscess and drain and remains moderately thickened. The splenic flexure has a more normal appearance. However, the distal transverse colon in the left upper quadrant now appears abnormally thickened, an the wall appears edematous. The dependent wall appears more hypodense (series 2, image 30), and this is in proximity to 1 of the residual abscess components near the lesser sac. A segment of about 9 cm is affected. See also coronal image 16.  Upstream of that segment of the transverse colon and right colon appear normal. The terminal ileum is decompressed. There are multiple decompressed small bowel loops in the pelvis. There are multiple decompressed small bowel loops in the left lateral abdomen. There are upstream dilated small bowel loops with air and fluid levels, and once again the transition point appears to be the site of the left retroperitoneal abscess on series 2, image 37. Small bowel dilatation has slightly regressed  Since 10/12/2014. The stomach appears stable, including a dominant 3.7 cm lesser sac abscess (series 2, image 26), with evidence of smaller gastrosplenic ligament abscesses (image 22). The dominant abscess in the lesser sac no longer contains gas. The pancreas is abutted by the lesser sac abscess but otherwise enhances normally. No pancreatic ductal dilatation. The liver, gallbladder, right adrenal gland,  and right kidney appear stable and within normal limits. Right renal contrast excretion an the right ureter appear within normal limits.  Posterior approach percutaneous retroperitoneal abscess drain remains in place with interval drainage of the dominant abscess (which measured up to 7.2 cm recently). Small volume residual left abscess adjacent to the left psoas muscle (series 2, image 38). Small volume residual gas. The spleen is abutted by these changes and appears stable. The left kidney is surgically absent, with numerous surgical clips also in the region.  The portal venous system appears patent. Aortoiliac calcified atherosclerosis noted. Major arterial structures in the abdomen and pelvis are patent. Trace abdominal free fluid adjacent to the liver is stable. No pneumoperitoneum.  IMPRESSION: 1. Satisfactory drainage of the dominant left retroperitoneal abscess following drain replacement recently. However, multiple regional daughter abscesses remain, including the largest which is 3.7 cm in the lesser sac. 2. Persistent small bowel obstruction, with transition point abutting the dominant draining abscess. Small bowel dilatation  perhaps slightly regressed in the last week. 3. Secondary inflammation of the descending colon near the dominant draining abscess. New 9 cm segment of thick walled distal transverse colon where sub serosal involvement by abscess is difficult to exclude (series 2, image 30). 4. Stable layering left pleural effusion and minor lung base atelectasis. Stable small perihepatic and pelvic free fluid.   Electronically Signed   By: Genevie Ann M.D.   On: 10/19/2014 11:31   Ct Abdomen Pelvis W Contrast  10/12/2014   CLINICAL DATA:  69 year old male with nausea and vomiting. History of bowel obstruction history of left retroperitoneal liposarcoma status post resection  EXAM: CT ABDOMEN AND PELVIS WITH CONTRAST  TECHNIQUE: Multidetector CT imaging of the abdomen and pelvis was performed using the  standard protocol following bolus administration of intravenous contrast.  CONTRAST:  46m OMNIPAQUE IOHEXOL 300 MG/ML  SOLN  COMPARISON:  CT dated 08/29/2014  FINDINGS: Partially visualized small left pleural effusion. There is atelectatic changes of the left lung base. Pneumonia is not excluded. No intra-abdominal free air. There is diffuse mesenteric stranding and small perihepatic and right lower quadrant free fluid.  Subcentimeter hepatic hypodense lesion is too small to characterize. The liver is otherwise unremarkable. The gallbladder, pancreas, spleen, right adrenal gland appear unremarkable. Stable right renal hypodense lesion, likely a cyst. There is no hydronephrosis on the right. The visualized right ureter and urinary bladder as well as the prostate and seminal vesicles are grossly unremarkable.  The left adrenal gland is not visualized. Postsurgical changes of left nephrectomy. There is a 7.2 x 4.0 cm complex fluid collection containing small pockets of air in the left retroperitoneum and along the left psoas muscle compatible with an abscess. There has been interval removal of the percutaneous drainage catheter with interval increase in size of this collection compared to the prior study. Multiple loculated fluid noted along the greater curvature of the stomach and superior to the pancreas compatible with multiloculated abscesses. There has been interval increase in the size of this abscess is compared to the prior study. The largest pocket measures approximately 4.3 x 2.6 cm (series 6, image 8). A tract like structure containing pockets of air noted extending from the abscesses along the stomach wall to the larger abscess in the left retroperitoneum along the psoas muscle.  An enteric tube is noted with tip in the stomach. There is dilatation of loops of proximal and mid small bowel. The distal small bowel and terminal ileum are collapsed. The transition zone is noted in the left upper abdomen  (coronal series 5, image 48 and axial series 2, image 33) adjacent to the retroperitoneal abscess and surgical clips. This may be related to surrounding inflammation or adhesion. There is extensive colonic diverticulosis. There is focal inflammation and thickening of the upper descending colon adjacent to the retroperitoneal abscess, likely secondary to adjacent inflammation. The appendix appears unremarkable.  There is aortoiliac atherosclerotic disease. No portal venous gas. Midline vertical anterior abdominal wall incisional scar. Degenerative changes of the spine. No acute fracture.  IMPRESSION: Interval removal of the left posterior percutaneous drainage catheter with interval increase in the size of the left retroperitoneal abscess. Multiple smaller abscesses noted along the posterior wall and greater curvature of the stomach which also have increased in size compared to the prior study. These collections appear to communicate with the larger abscess in the left retroperitoneum along the psoas muscle.  Small-bowel obstruction with transition zone in the left upper abdomen adjacent to the retroperitoneal  collection. The cause of the obstruction may be related to adjacent inflammation or adhesions.  Small left pleural effusion with partial left lower lobe atelectasis/pneumonia.   Electronically Signed   By: Anner Crete M.D.   On: 10/12/2014 04:00   Ir Fluoro Guide Cv Line Right  10/12/2014   INDICATION: Retroperitoneal Abscess: Need for Central Venous Access for Prolonged Intravenous Antibiotics  EXAM: ULTRASOUND AND FLUOROSCOPIC GUIDED PICC LINE INSERTION  MEDICATIONS: None.  CONTRAST:  None  FLUOROSCOPY TIME:  6 seconds.  COMPLICATIONS: None immediate  TECHNIQUE: The procedure, risks, benefits, and alternatives were explained to the patient and informed written consent was obtained. A timeout was performed prior to the initiation of the procedure.  The right upper extremity was prepped with  chlorhexidine in a sterile fashion, and a sterile drape was applied covering the operative field. Maximum barrier sterile technique with sterile gowns and gloves were used for the procedure. A timeout was performed prior to the initiation of the procedure. Local anesthesia was provided with 1% lidocaine.  Under direct ultrasound guidance, the right brachial vein was accessed with a micropuncture kit after the overlying soft tissues were anesthetized with 1% lidocaine. An ultrasound image was saved for documentation purposes. A guidewire was advanced to the level of the superior caval-atrial junction for measurement purposes and the PICC line was cut to length. A peel-away sheath was placed and a 39 cm, 5 [place], dual lumen was inserted to level of the superior caval-atrial junction. A post procedure spot fluoroscopic was obtained. The catheter easily aspirated and flushed and was sutured in place. A dressing was placed. The patient tolerated the procedure well without immediate post procedural complication.  FINDINGS: After catheter placement, the tip lies within the superior cavoatrial junction. The catheter aspirates and flushes normally and is ready for immediate use.  IMPRESSION: Successful ultrasound and fluoroscopic guided placement of a right brachial vein approach, 39 cm, 5 [place], dual lumen PICC with tip at the superior caval-atrial junction. The PICC line is ready for immediate use.  Read By:  Gareth Eagle, PA-C   Electronically Signed   By: Jacqulynn Cadet M.D.   On: 10/12/2014 13:50   Ir US Guide Vasc Access Right  10/12/2014   INDICATION: Retroperitoneal Abscess: Need for Central Venous Access for Prolonged Intravenous Antibiotics  EXAM: ULTRASOUND AND FLUOROSCOPIC GUIDED PICC LINE INSERTION  MEDICATIONS: None.  CONTRAST:  None  FLUOROSCOPY TIME:  6 seconds.  COMPLICATIONS: None immediate  TECHNIQUE: The procedure, risks, benefits, and alternatives were explained to the patient and informed written  consent was obtained. A timeout was performed prior to the initiation of the procedure.  The right upper extremity was prepped with chlorhexidine in a sterile fashion, and a sterile drape was applied covering the operative field. Maximum barrier sterile technique with sterile gowns and gloves were used for the procedure. A timeout was performed prior to the initiation of the procedure. Local anesthesia was provided with 1% lidocaine.  Under direct ultrasound guidance, the right brachial vein was accessed with a micropuncture kit after the overlying soft tissues were anesthetized with 1% lidocaine. An ultrasound image was saved for documentation purposes. A guidewire was advanced to the level of the superior caval-atrial junction for measurement purposes and the PICC line was cut to length. A peel-away sheath was placed and a 39 cm, 5 [place], dual lumen was inserted to level of the superior caval-atrial junction. A post procedure spot fluoroscopic was obtained. The catheter easily aspirated and flushed and  was sutured in place. A dressing was placed. The patient tolerated the procedure well without immediate post procedural complication.  FINDINGS: After catheter placement, the tip lies within the superior cavoatrial junction. The catheter aspirates and flushes normally and is ready for immediate use.  IMPRESSION: Successful ultrasound and fluoroscopic guided placement of a right brachial vein approach, 39 cm, 5 French, dual lumen PICC with tip at the superior caval-atrial junction. The PICC line is ready for immediate use.  Read By:  Gareth Eagle, PA-C   Electronically Signed   By: Jacqulynn Cadet M.D.   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  EXAM: ABSCESS INJECTION  Date: 10/31/2014  PROCEDURE: 1. Catheter injection under fluoroscopy Interventional Radiologist:   Criselda Peaches, MD  ANESTHESIA/SEDATION: None required  MEDICATIONS: None  FLUOROSCOPY TIME:  0 minutes 36 seconds  59 dGy  CONTRAST:  10 mL Omnipaque 350  TECHNIQUE: An injection of contrast material under real-time fluoroscopic guidance demonstrates a collapsed abscess cavity about the drainage catheter. There is reflux of contrast material along the tube to the skin surface. No evidence of fistulization to adjacent large or small bowel.  COMPLICATIONS: None  IMPRESSION: 1. No evidence of fistulization. 2. Minimal residual collapsed abscess cavity about the drainage catheter. Injected contrast material refluxes along the tube to the skin surface. 3. The tube was removed.  Signed,  Criselda Peaches, MD  Vascular [facility]  [facility]   Electronically Signed   By: Jacqulynn Cadet M.D.   On: 10/31/2014 17:17   Dg Abd 2 Views  10/16/2014   CLINICAL DATA:  Persistent abdominal pain and distention  EXAM: ABDOMEN - 2 VIEW  COMPARISON:  October 15, 2014  FINDINGS: Supine and upright images obtained. Compared to 1 day prior, there is no longer appreciable small bowel dilatation. Multiple air-fluid levels, however, do remain. No free air. Nasogastric tube tip and side port in stomach. Drain in the left abdomen remains. Multiple surgical clips are noted on the left, stable.  IMPRESSION: Resolution of bowel dilatation. Multiple air-fluid levels remain. Nasogastric tube tip and side port in stomach. Suspect partial but incomplete resolution of bowel obstruction. No free air.   Electronically Signed   By: Lowella Grip III M.D.   On: 10/16/2014 07:29   Dg Abd 2 Views  10/15/2014   CLINICAL DATA:  Nasogastric tube in place there follow-up small bowel obstruction. Patient reports back pain when moving. History of left kidney drain.  EXAM: ABDOMEN - 2 VIEW  COMPARISON:  10/14/2014 and earlier  FINDINGS: Nasogastric tube is in place with tip overlying the region of the  stomach. There is dense opacity in the left lower lobe of the lung consistent with atelectasis, consolidation and pleural effusion.  Surgical clips and drain are identified in the left upper quadrant as before.  There is moderate dilatation of small bowel loops which contain air-fluid levels on the erect view. The degree of dilatation is similar compared prior studies. However fewer bowel loops appear dilated compared with prior studies. There has been interval evacuation of some of the oral contrast from the colon since prior study.  IMPRESSION: 1. Persistent small bowel dilatation. 2. Interval evacuation of some of the contrast from the colon. 3. Left lower lobe atelectasis/consolidation and effusion.   Electronically Signed   By: Nolon Nations M.D.  On: 10/15/2014 09:10   Dg Abd Acute W/chest  10/12/2014   CLINICAL DATA:  Nausea and vomiting since yesterday. 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 Nonsmoker.  EXAM: DG ABDOMEN ACUTE W/ 1V CHEST  COMPARISON:  CT abdomen and pelvis 08/29/2014. Abdominal series [id number].  FINDINGS: Blunting of the left costophrenic angle without change since previous study, likely representing pleural thickening. Normal heart size and pulmonary vascularity. No focal airspace disease or consolidation in the lungs. No pneumothorax. Mediastinal contours appear intact.  Surgical clips in the left upper quadrant. Interval development of prominent gaseous distention of mid abdominal small bowel with paucity of gas in the colon. Multiple air-fluid levels. Findings suggest small bowel obstruction. No free intra-abdominal air. No radiopaque stones. Degenerative changes in the spine and hips.  IMPRESSION: Gaseous distention of small bowel with air-fluid levels consistent with small bowel obstruction.   Electronically Signed   By: Lucienne Capers M.D.   On: 10/12/2014 00:27   Dg Abd Portable 1v-small Bowel Obstruction Protocol-initial, 8 Hr Delay  11/08/2014    CLINICAL DATA:  Small bowel obstruction protocol. Contrast administered at 0345 hours.  EXAM: PORTABLE ABDOMEN - 1 VIEW  COMPARISON:  CT and radiographs 11/07/2014.  FINDINGS: 1156 hours. Nasogastric tube is visualized within the gastric fundus. There is a small amount of contrast within the stomach and distal small bowel which remains dilated. Contrast is also present within the colon, extending to the rectum, increased in density from yesterday, implying that some of the recently administered contrast has passed into the colon. There is still moderate diffuse small bowel distension. Multiple left abdominal surgical clips are noted. No evidence of free intraperitoneal air or contrast extravasation.  IMPRESSION: Persistent moderate small bowel distension in excess of the colon. However, the volume of contrast in the colon has increased from yesterday's radiographs, implying some passage of contrast through the small bowel.   Electronically Signed   By: Richardean Sale M.D.   On: 11/08/2014 12:31   Dg Abd Portable 1v-small Bowel Protocol-position Verification  11/08/2014   CLINICAL DATA:  Nasogastric tube placement  EXAM: PORTABLE ABDOMEN - 1 VIEW  COMPARISON:  CT abdomen and pelvis November 07, 2014  FINDINGS: Nasogastric tube tip and side port are in the stomach. There are loops of dilated bowel indicating a degree of obstruction. No free air apparent. There are multiple surgical clips in the mid abdomen.  IMPRESSION: Nasogastric tube tip and side port in stomach. Dilated loops of bowel consistent with a degree of obstruction.   Electronically Signed   By: Lowella Grip III M.D.   On: 11/08/2014 01:12   Dg Abd Portable 1v  10/14/2014   CLINICAL DATA:  Small-bowel obstruction. History of left retroperitoneal mass, retroperitoneal fluid collection, resection of retroperitoneal mass.  EXAM: PORTABLE ABDOMEN - 1 VIEW  COMPARISON:  7152 I  FINDINGS: There is residual contrast in the colon following recent CT  exam. A left-sided percutaneous drain remains in place in the regional numerous surgical clips. There is dilatation of small bowel loops in the central abdomen. There has been slight interval improvement in dilatation since previous exam.  IMPRESSION: 1. Persistent small bowel obstruction showing slight interval decrease in distension of small bowel loops. 2. Persistent left upper quadrant drain. 3. Residual contrast in the colon.   Electronically Signed   By: Nolon Nations M.D.   On: 10/14/2014 07:26   Dg Abd Portable 1v  10/13/2014   CLINICAL DATA:  Followup small bowel  obstruction. Abdominal abscess. Retroperitoneal liposarcoma.  EXAM: PORTABLE ABDOMEN - 1 VIEW  COMPARISON:  10/11/2014  FINDINGS: A new nasogastric tube is seen with tip in the proximal stomach. Decreased small bowel dilatation is seen compared to previous study. Oral contrast from recent CT is now seen in the colon.  A new percutaneous drainage catheter is seen in the left upper quadrant. Multiple surgical clips again seen in left quadrant.  IMPRESSION: Decreased small bowel dilatation since prior exam. Nasogastric tube in appropriate position.   Electronically Signed   By: Earle Gell M.D.   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 Barry Dienes. This was complicated by development of a retroperitoneal abscess. Dr. Pascal Lux placed a drain on 08/15/2014. Patient then underwent a drain/fistula check by Dr. Pascal Lux on 09/26/2014. There was no evidence of fistulous connection to any adjacent organ and the drainage catheter was removed.He had been doing well until Tuesday afternoon when he developed nausea, vomiting and bloating. CT shows an increase of the left retroperitoneal abscess with multiple smaller abscesses which communicate with the larger one and a small bowel obstruction  with transition zone in the left upper abdomen. Patient presents for replacement of percutaneous drainage catheter.  EXAM: CT IMAGE GUIDED DRAINAGE BY PERCUTANEOUS CATHETER  Date: 10/12/2014  PROCEDURE: 1. Placement of a 12 French drainage catheter modified with additional sideholes into the left retroperitoneal abscess Interventional Radiologist:  Criselda Peaches, MD  ANESTHESIA/SEDATION: Moderate (conscious) sedation was used. 4 mg Versed, 50 mcg Fentanyl were administered intravenously. The patient's vital signs were monitored continuously by radiology nursing throughout the procedure.  Sedation Time: 33 minutes  MEDICATIONS: None additional  TECHNIQUE: Informed consent was obtained from the patient following explanation of the procedure, risks, benefits and alternatives. The patient understands, agrees and consents for the procedure. All questions were addressed. A time out was performed.  A planning axial CT scan was performed. The complex fluid and gas collection in the left retroperitoneal space was successfully identified. A suitable entry site just medial and inferior to the left twelfth rib was selected. The skin was marked. The region was then sterilely prepped and draped in standard fashion using Betadine skin prep. Local anesthesia was attained by infiltration with 1% lidocaine. A small dermatotomy was made. Under intermittent CT fluoroscopic guidance, an 18 gauge trocar needle was advanced into the inferior aspect of the left retroperitoneal fluid collection. A short Amplatz wire was then advanced cranially within the fluid collection. The skin tract was then dilated and a Cook 12 [place] all-purpose drainage catheter modified with additional sideholes was advanced over the wire and formed within the abscess cavity. Aspiration yields approximately 100 mL thick, purulent material.  Post aspiration CT imaging demonstrates resolution of fluid, however there is a persistent thick walled cavity containing  air which has not collapsed around the catheter. The catheter was secured to the skin with 0 Prolene suture and connected to JP bulb suction.  COMPLICATIONS: None  Estimated blood loss: 0  IMPRESSION: 1. Successful placement of a 12 French drainage catheter modified with additional sideholes into the recurrent left retroperitoneal abscess cavity. Aspiration yields 100 mL thick, purulent material. Samples were sent for culture, anaerobic culture and fungal culture. 2. Following aspiration, the thick walled cavity does not collapse and remains present as an air containing potential space.  Signed,  Criselda Peaches, MD  [facility]  [facility]   Electronically Signed   By: Jacqulynn Cadet M.D.   On: 10/12/2014 17:02    Microbiology: No results found for this or any previous visit (from the past 240 hour(s)).   Labs: Basic Metabolic Panel:  Recent Labs Lab 11/07/14 1740 11/08/14 0535 11/09/14 0535 11/10/14 0448  NA 132* 136 137 139  K 4.0 3.8 3.7 3.8  CL 96* 104 105 105  CO2 24 24 21* 23  GLUCOSE 117* 94 78 88  BUN 16 12 13 9   CREATININE 1.20 1.03 1.07 1.02  CALCIUM 9.1 8.5* 8.4* 8.7*   Liver Function Tests:  Recent Labs Lab 11/07/14 1740 11/08/14 0535 11/09/14 0535 11/10/14 0448  AST 21 14* 15 16  ALT 14* 10* 10* 11*  ALKPHOS 107 90 86 89  BILITOT 0.2* 0.5 0.5 0.5  PROT 7.5 6.1* 5.7* 6.3*  ALBUMIN 2.9* 2.2* 2.3* 2.4*    Recent Labs Lab 11/07/14 1740  LIPASE 41   No results for input(s): AMMONIA in the last 168 hours. CBC:  Recent Labs Lab 11/07/14 1740 11/08/14 0535 11/09/14 0535 11/10/14 0448  WBC 12.2* 7.7 8.8 7.7  NEUTROABS 11.0* 6.4 7.5 6.4  HGB 10.1* 8.5* 8.7* 8.7*  HCT 31.4* 26.4* 26.9* 28.1*  MCV 83.7 83.5 84.3 83.6  PLT 591* 481* 462* 492*   Cardiac Enzymes: No results for input(s): CKTOTAL, CKMB, CKMBINDEX, TROPONINI in the last 168 hours. BNP: BNP (last 3 results) No results for input(s): BNP  in the last 8760 hours.  ProBNP (last 3 results) No results for input(s): PROBNP in the last 8760 hours.  CBG: No results for input(s): GLUCAP in the last 168 hours.     SignedDebbe Odea, MD [facility] 11/10/2014, 4:01 PM

## 2014-11-10 NOTE — Progress Notes (Signed)
Darryl Griffith to be D/C'd Home per MD order.  Discussed prescriptions and follow up appointments with the patient. Prescriptions given to patient, medication list explained in detail. Pt verbalized understanding.    Medication List    TAKE these medications        chlorproMAZINE 25 MG tablet  Commonly known as:  THORAZINE  Take 1 tablet (25 mg total) by mouth 3 (three) times daily as needed.     ertapenem 1 g in sodium chloride 0.9 % 50 mL  Inject 1 g into the vein daily after supper.     ferrous sulfate 325 (65 FE) MG tablet  TAKE 1 TABLET (325 MG TOTAL) BY MOUTH 2 (TWO) TIMES DAILY WITH A MEAL.     LORazepam 1 MG tablet  Commonly known as:  ATIVAN  Take 1 tablet by mouth 3 (three) times daily as needed for anxiety. Anxiety/nausea     Melatonin 5 MG Caps  Take 1 capsule by mouth at bedtime.     multivitamin with minerals Tabs tablet  Take 1 tablet by mouth every morning.     ondansetron 8 MG disintegrating tablet  Commonly known as:  ZOFRAN ODT  Take 1 tablet (8 mg total) by mouth 3 (three) times daily before meals.     pantoprazole 40 MG tablet  Commonly known as:  PROTONIX  Take 1 tablet (40 mg total) by mouth daily.     traZODone 50 MG tablet  Commonly known as:  DESYREL  Take 50 mg by mouth at bedtime.        Filed Vitals:   11/10/14 1517  BP: 141/76  Pulse: 97  Temp:   Resp: 21    Skin clean, dry and intact without evidence of skin break down, no evidence of skin tears noted. Pt d/c to home with PICC line for IV abx. Pt denies pain at this time. No complaints noted.  An After Visit Summary was printed and given to the patient. Patient escorted via [place], and D/C home via private auto.  Lolita Rieger 11/10/2014 7:06 PM

## 2014-11-10 NOTE — Progress Notes (Signed)
Patient ID: Darryl Griffith, male   DOB: Apr 28, 1945, 69 y.o.   MRN: [id number]    Referring Physician(s): TRH/ID  Chief Complaint: Recent nausea/vomiting, small bowel obstruction, residual left RP fluid collection   Subjective: Patient familiar to  IR service from prior placement of a left retroperitoneal fluid collection drain on 08/01/14 and again on 10/12/2014 secondary to recurrence. He is status post left nephrectomy and excision of retroperitoneal liposarcoma in April 2025 which was complicated by development of a left retroperitoneal  abscess. The most recent drain was removed on 10/31/14 following injection study which revealed no evidence of fistulization and minimal residual collapsed abscess cavity. He presented to hospital on 8/9 with abdominal pain, nausea and vomiting. Subsequent imaging revealed moderate small bowel obstruction as well as residual fluid collection with gas in the left retroperitoneal region. He is currently afebrile and with normal WBC. Most recent cultures from retroperitoneal collection negative. He currently denies fevers, chills, chest pain, dyspnea, significant abdominal pain, nausea, vomiting or abnormal bleeding. He has had a few loose stools. Request has now been received for aspiration and culture of left retroperitoneal fluid collection.   Allergies: Compazine  Medications: Prior to Admission medications   Medication Sig Start Date End Date Taking? Authorizing Provider  ertapenem 1 g in sodium chloride 0.9 % 50 mL Inject 1 g into the vein daily after supper. 10/20/14 11/10/14 Yes Megan Darnelle Spangle, PA-C  ferrous sulfate 325 (65 FE) MG tablet TAKE 1 TABLET (325 MG TOTAL) BY MOUTH 2 (TWO) TIMES DAILY WITH A MEAL. 10/20/14  Yes Historical Provider, MD  LORazepam (ATIVAN) 1 MG tablet Take 1 tablet by mouth 3 (three) times daily as needed for anxiety. Anxiety/nausea 09/01/14  Yes Historical Provider, MD  Melatonin 5 MG CAPS Take 1 capsule by mouth at bedtime.  07/27/14  Yes  Historical Provider, MD  Multiple Vitamin (MULTIVITAMIN WITH MINERALS) TABS tablet Take 1 tablet by mouth every morning.    Yes Historical Provider, MD  ondansetron (ZOFRAN ODT) 8 MG disintegrating tablet Take 1 tablet (8 mg total) by mouth every 8 (eight) hours as needed for nausea or vomiting. 10/11/14  Yes Jola Schmidt, MD  pantoprazole (PROTONIX) 40 MG tablet Take 1 tablet (40 mg total) by mouth daily. 09/22/14  Yes Donne Hazel, MD  traZODone (DESYREL) 50 MG tablet Take 50 mg by mouth at bedtime.    Yes Historical Provider, MD  chlorproMAZINE (THORAZINE) 25 MG tablet Take 1 tablet (25 mg total) by mouth 3 (three) times daily as needed. 10/26/14   Tyler Pita, MD     Vital Signs: BP 146/70 mmHg  Pulse 98  Temp(Src) 98.4 F (36.9 C) (Oral)  Resp 18  Ht 5\' 8"  (1.727 m)  Wt 161 lb 12.8 oz (73.392 kg)  BMI 24.61 kg/m2  SpO2 97%  Physical Exam patient awake, alert. Chest clear to auscultation bilaterally. Heart-slightly tachycardic but regular. abd- soft, positive bowel sounds, nontender. Ext-  full range of motion and no significant edema.  Imaging: Ct Abdomen Pelvis W Contrast  11/07/2014   CLINICAL DATA:  Acute epigastric abdominal pain, vomiting.  EXAM: CT ABDOMEN AND PELVIS WITH CONTRAST  TECHNIQUE: Multidetector CT imaging of the abdomen and pelvis was performed using the standard protocol following bolus administration of intravenous contrast.  CONTRAST:  72mL OMNIPAQUE IOHEXOL 300 MG/ML SOLN, 148mL OMNIPAQUE IOHEXOL 300 MG/ML SOLN  COMPARISON:  CT scan of October 31, 2014.  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  Minimal left posterior basilar subsegmental atelectasis is noted.  No gallstones are noted. The liver and pancreas appear normal. Spleen is unchanged. Left retroperitoneal drainage catheter noted on prior exam has been removed. The fluid collection appears to have significantly enlarged compared to prior exam, now measuring 5.6  x 2.6 cm. This is consistent with worsening abscess. Surgical clips are noted in the left retroperitoneal region which are unchanged compared to prior exam. Status post left nephrectomy. Right adrenal gland and kidney appear normal. Left adrenal gland is not clearly visualized. Stable 3.3 x 2.7 cm lesser sac fluid collection is noted. 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 There is significantly increased small bowel dilatation with transition zone seen in the left upper quadrant in area of previously described surgery, best seen on image number 37 of series 2. More distal small bowel is normal in caliber. Sigmoid diverticulosis is noted without inflammation. Urinary bladder appears normal. No significant adenopathy is noted.  IMPRESSION: Extensive postsurgical changes are noted in left upper quadrant consistent with prior left nephrectomy. There is significantly increased proximal small bowel dilatation with transition zone seen in the left upper quadrant adjacent to postoperative changes, consistent with small bowel obstruction, most likely due to adhesion or scarring.  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  Left retroperitoneal drainage catheter noted on prior exam has been removed in the interval. Significantly increased fluid collection containing gas is seen in this area consistent with abscess.  Stable abscess or fluid collection is seen in the lesser sac.   Electronically Signed   By: Marijo Conception, M.D.   On: 11/07/2014 19:37   Dg Abd Portable 1v-small Bowel Obstruction Protocol-initial, 8 Hr Delay  11/08/2014   CLINICAL DATA:  Small bowel obstruction protocol. Contrast administered at 0345 hours.  EXAM: PORTABLE ABDOMEN - 1 VIEW  COMPARISON:  CT and radiographs 11/07/2014.  FINDINGS: 1156 hours. Nasogastric tube is visualized within the gastric fundus. There is a small amount of contrast within the stomach and distal small bowel which  remains dilated. Contrast is also present within the colon, extending to the rectum, increased in density from yesterday, implying that some of the recently administered contrast has passed into the colon. There is still moderate diffuse small bowel distension. Multiple left abdominal surgical clips are noted. No evidence of free intraperitoneal air or contrast extravasation.  IMPRESSION: Persistent moderate small bowel distension in excess of the colon. However, the volume of contrast in the colon has increased from yesterday's radiographs, implying some passage of contrast through the small bowel.   Electronically Signed   By: Richardean Sale M.D.   On: 11/08/2014 12:31   Dg Abd Portable 1v-small Bowel Protocol-position Verification  11/08/2014   CLINICAL DATA:  Nasogastric tube placement  EXAM: PORTABLE ABDOMEN - 1 VIEW  COMPARISON:  CT abdomen and pelvis November 07, 2014  FINDINGS: Nasogastric tube tip and side port are in the stomach. There are loops of dilated bowel indicating a degree of obstruction. No free air apparent. There are multiple surgical clips in the mid abdomen.  IMPRESSION: Nasogastric tube tip and side port in stomach. Dilated loops of bowel consistent with a degree of obstruction.   Electronically Signed   By: Lowella Grip III M.D.   On: 11/08/2014 01:12    Labs:  CBC:  Recent Labs  11/07/14 1740 11/08/14 0535 11/09/14 0535 11/10/14 0448  WBC 12.2* 7.7 8.8 7.7  HGB 10.1* 8.5* 8.7* 8.7*  HCT 31.4*  26.4* 26.9* 28.1*  PLT 591* 481* 462* 492*    COAGS:  Recent Labs  07/13/14 1939  07/14/14 1055 07/14/14 1830 07/15/14 0058 07/15/14 0515 07/16/14 0700 09/18/14 1400  INR 1.42  --  1.20  --   --   --  1.21 1.06  APTT  --   < > 33 36 31 33  --  30  < > = values in this interval not displayed.  BMP:  Recent Labs  11/07/14 1740 11/08/14 0535 11/09/14 0535 11/10/14 0448  NA 132* 136 137 139  K 4.0 3.8 3.7 3.8  CL 96* 104 105 105  CO2 24 24 21* 23    GLUCOSE 117* 94 78 88  BUN 16 12 13 9   CALCIUM 9.1 8.5* 8.4* 8.7*  CREATININE 1.20 1.03 1.07 1.02  GFRNONAA 60* >60 >60 >60  GFRAA >60 >60 >60 >60    LIVER FUNCTION TESTS:  Recent Labs  11/07/14 1740 11/08/14 0535 11/09/14 0535 11/10/14 0448  BILITOT 0.2* 0.5 0.5 0.5  AST 21 14* 15 16  ALT 14* 10* 10* 11*  ALKPHOS 107 90 86 89  PROT 7.5 6.1* 5.7* 6.3*  ALBUMIN 2.9* 2.2* 2.3* 2.4*    Assessment and Plan: Patient with history of left nephrectomy and excision of retroperitoneal liposarcoma in April 8675 complicated by development of RP abscess. Patient has undergone  percutaneous drainage of abscess twice and now presents with small bowel obstruction and residual left retroperitoneal fluid collection. Request now received for aspiration and culture of fluid collection. Recent imaging studies have been reviewed. Left RP abscess is smaller in size since previous studies. At this point recommend only aspiration of collection for culture. Patient is afebrile and with normal white blood cell count. Depending on results, a drainage catheter can be placed at later date with understanding that drain may need to remain in place for several months. Above discussed with Dr. Baxter Flattery. Details/risks of procedure, including but not limited to, internal bleeding, infection, injury to adjacent structures, discussed with patient with his understanding and consent. Procedure scheduled for later today.   Signed: D. Rowe Avis 11/10/2014, 1:56 PM   I spent a total of 20 minutes at the the patient's bedside AND on the patient's hospital floor or unit, greater than 50% of which was counseling/coordinating care for CT-guided aspiration of left retroperitoneal fluid collection.

## 2014-11-10 NOTE — Procedures (Signed)
Interventional Radiology Procedure Note  Procedure:  CT guided aspiration of left retroperitoneal fluid collection  Complications:  None  Estimated Blood Loss: None  Findings:  Aspiration with 5 Fr Yueh catheter yielded 20 mL of turbid yellow fluid.  Sent for culture studies. Additional lavage performed with 10 mL of sterile saline.  CT shows decompression of collection.  Venetia Night. Kathlene Cote, M.D Pager:  [phone]

## 2014-11-10 NOTE — Progress Notes (Signed)
Subjective: He feels better, having BM, no pain or distension.  + BS somewhat hyperactive.  Tolerating clears.   Objective: Vital signs in last 24 hours: Temp:  [98.4 F (36.9 C)-98.6 F (37 C)] 98.4 F (36.9 C) (08/12 0544) Pulse Rate:  [96-103] 98 (08/12 0544) Resp:  [18] 18 (08/12 0544) BP: (129-147)/(70-74) 146/70 mmHg (08/12 0544) SpO2:  [97 %-98 %] 97 % (08/12 0544) Last BM Date: 11/09/14 He had 2 BM's yesterday, PO not recorded, we pulled NG last PM Diet: clears started last PM Afebrile, VSS Labs look good There is a fluid collection with some gas noted on CT from 8/9 that has not been addressed. He remains afebrile, with a normal WBC Intake/Output from previous day: 08/11 0701 - 08/12 0700 In: 3020 [I.V.:3020] Out: 1725 [Urine:1725] Intake/Output this shift: Total I/O In: -  Out: 500 [Urine:500]  General appearance: alert, cooperative and no distress GI: soft, + BS, taking clears, no distension or tenderness.  +BM.  Lab Results:   Recent Labs  11/09/14 0535 11/10/14 0448  WBC 8.8 7.7  HGB 8.7* 8.7*  HCT 26.9* 28.1*  PLT 462* 492*    BMET  Recent Labs  11/09/14 0535 11/10/14 0448  NA 137 139  K 3.7 3.8  CL 105 105  CO2 21* 23  GLUCOSE 78 88  BUN 13 9  CREATININE 1.07 1.02  CALCIUM 8.4* 8.7*   PT/INR No results for input(s): LABPROT, INR in the last 72 hours.   Recent Labs Lab 11/07/14 1740 11/08/14 0535 11/09/14 0535 11/10/14 0448  AST 21 14* 15 16  ALT 14* 10* 10* 11*  ALKPHOS 107 90 86 89  BILITOT 0.2* 0.5 0.5 0.5  PROT 7.5 6.1* 5.7* 6.3*  ALBUMIN 2.9* 2.2* 2.3* 2.4*     Lipase     Component Value Date/Time   LIPASE 41 11/07/2014 1740     Studies/Results: Dg Abd Portable 1v-small Bowel Obstruction Protocol-initial, 8 Hr Delay  11/08/2014   CLINICAL DATA:  Small bowel obstruction protocol. Contrast administered at 0345 hours.  EXAM: PORTABLE ABDOMEN - 1 VIEW  COMPARISON:  CT and radiographs 11/07/2014.  FINDINGS: 1156  hours. Nasogastric tube is visualized within the gastric fundus. There is a small amount of contrast within the stomach and distal small bowel which remains dilated. Contrast is also present within the colon, extending to the rectum, increased in density from yesterday, implying that some of the recently administered contrast has passed into the colon. There is still moderate diffuse small bowel distension. Multiple left abdominal surgical clips are noted. No evidence of free intraperitoneal air or contrast extravasation.  IMPRESSION: Persistent moderate small bowel distension in excess of the colon. However, the volume of contrast in the colon has increased from yesterday's radiographs, implying some passage of contrast through the small bowel.   Electronically Signed   By: Richardean Sale M.D.   On: 11/08/2014 12:31    Medications: . antiseptic oral rinse  7 mL Mouth Rinse q12n4p  . bisacodyl  10 mg Rectal Daily  . chlorhexidine  15 mL Mouth Rinse BID  . chlorproMAZINE (THORAZINE) injection  25 mg Intramuscular Once  . ertapenem (INVANZ) IV  1 g Intravenous Q24H  . lip balm  1 application Topical BID  . sodium chloride  10-40 mL Intracatheter Q12H  . 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 Radiation Rx  07/13/14-10/02/12 Hospitalized 7/13-22/16 with intraabdominal abscess/SBO/ anemia with transfusion/ 3 weeks of abx with follow up in ID clinic, Dr. Johnnye Sima Percutaneous drain placement for retroperitoneal abscess 10/12/14 Drain removed 10/31/14 PCM Hypertension Hx of angina Antibiotics: Invanz since 10/13/14. DVT: SCD/lovenox ordered but pt refused    Plan:  Dr. Wynelle Cleveland has talked to CT and DR. Snider about fluid collection.  Dr. Baxter Flattery will see him and we will get a decision about placing drain today.  I will make him NPO for now till we have  a plan in place.  i planned to advance diet if not for the drain issue.  LOS: 3 days    Alejandrina Raimer 11/10/2014

## 2014-11-10 NOTE — Progress Notes (Signed)
Pt was active with [facility] and will continue at discharge with [facility] and other [facility] needs.

## 2014-11-10 NOTE — Progress Notes (Signed)
[facility] Progress Note   Darryl Griffith YOV:[id number] DOB: 01-24-1946 DOA: 11/07/2014 PCP: Gennette Pac, MD  Brief narrative: Darryl Griffith is a 69 y.o. male with retroperitoneal liposarcoma status post resection in April 7412 complicated by intra-abdominal abscesses requiring percutaneous drain and IV antibiotics. Has had 2 episodes of SBO in the past with spontaneous resolution. He presents with nausea and vomiting and is found to have a bowel obstruction. NG tube has been placed and surgery is following and managing.   Subjective: No abdominal pain or nausea-tolerating clear liquids. No fevers.  Assessment/Plan: Principal Problem:   SBO (small bowel obstruction) --Management per surgery-per their note, small bowel obstruction has resolved  Active Problems:  Abdominal abscesses - advised by Dr Johnnye Sima  to continue Invanz for 3 wks means to stop date should be in the next few days- CT scan on 8/9 reveals fluid collection still present and increasing-appreciate ID eval-Dr. Baxter Flattery recommending percutaneous drain be replaced by IR, fluid sent to lab for culture and Gram stain, continue ertapenem for 3 more weeks, follow-up in office in 2-3 weeks-Dr. Drucilla Schmidt with follow-up on culture    Hyponatremia -Resolved--continue maintenance fluids    Malnutrition of moderate degree -Start supplements   Code Status: Full code Family Communication:  DVT prophylaxis: Lovenox Consultants: An oral surgery Procedures:  Antibiotics: Anti-infectives    Start     Dose/Rate Route Frequency Ordered Stop   11/09/14 1400  ertapenem (INVANZ) 1 g in sodium chloride 0.9 % 50 mL IVPB     1 g 100 mL/hr over 30 Minutes Intravenous Every 24 hours 11/08/14 1618     11/08/14 1500  ertapenem (INVANZ) 1 g in sodium chloride 0.9 % 50 mL IVPB  Status:  Discontinued     1 g 100 mL/hr over 30 Minutes Intravenous Every 24 hours 11/08/14 1437 11/08/14 1618   11/08/14 1000  ertapenem (INVANZ) 1 g in  sodium chloride 0.9 % 50 mL IVPB  Status:  Discontinued     1 g 100 mL/hr over 30 Minutes Intravenous Every 24 hours 11/08/14 0308 11/08/14 1419      Objective: Filed Weights   11/07/14 2212  Weight: 73.392 kg (161 lb 12.8 oz)    Intake/Output Summary (Last 24 hours) at 11/10/14 1313 Last data filed at 11/10/14 8786  Gross per 24 hour  Intake   3020 ml  Output   2225 ml  Net    795 ml     Vitals Filed Vitals:   11/08/14 2214 11/09/14 1417 11/09/14 2128 11/10/14 0544  BP: 141/71 129/70 147/74 146/70  Pulse: 101 103 96 98  Temp: 99 F (37.2 C) 98.6 F (37 C) 98.6 F (37 C) 98.4 F (36.9 C)  TempSrc: Oral Oral Oral Oral  Resp: 16 18 18 18   Height:      Weight:      SpO2: 97% 98% 98% 97%    Exam:  General:  Pt is alert, not in acute distress  HEENT: No icterus, No thrush, oral mucosa moist  Cardiovascular: regular rate and rhythm, S1/S2 No murmur  Respiratory: clear to auscultation bilaterally   Abdomen: Soft, active Bowel sounds heard, non tender, non distended, no guarding  MSK: No LE edema, cyanosis or clubbing  Data Reviewed: Basic Metabolic Panel:  Recent Labs Lab 11/07/14 1740 11/08/14 0535 11/09/14 0535 11/10/14 0448  NA 132* 136 137 139  K 4.0 3.8 3.7 3.8  CL 96* 104 105 105  CO2 24 24 21* 23  GLUCOSE 117*  94 78 88  BUN 16 12 13 9   CREATININE 1.20 1.03 1.07 1.02  CALCIUM 9.1 8.5* 8.4* 8.7*   Liver Function Tests:  Recent Labs Lab 11/07/14 1740 11/08/14 0535 11/09/14 0535 11/10/14 0448  AST 21 14* 15 16  ALT 14* 10* 10* 11*  ALKPHOS 107 90 86 89  BILITOT 0.2* 0.5 0.5 0.5  PROT 7.5 6.1* 5.7* 6.3*  ALBUMIN 2.9* 2.2* 2.3* 2.4*    Recent Labs Lab 11/07/14 1740  LIPASE 41   No results for input(s): AMMONIA in the last 168 hours. CBC:  Recent Labs Lab 11/07/14 1740 11/08/14 0535 11/09/14 0535 11/10/14 0448  WBC 12.2* 7.7 8.8 7.7  NEUTROABS 11.0* 6.4 7.5 6.4  HGB 10.1* 8.5* 8.7* 8.7*  HCT 31.4* 26.4* 26.9* 28.1*  MCV  83.7 83.5 84.3 83.6  PLT 591* 481* 462* 492*   Cardiac Enzymes: No results for input(s): CKTOTAL, CKMB, CKMBINDEX, TROPONINI in the last 168 hours. BNP (last 3 results) No results for input(s): BNP in the last 8760 hours.  ProBNP (last 3 results) No results for input(s): PROBNP in the last 8760 hours.  CBG: No results for input(s): GLUCAP in the last 168 hours.  No results found for this or any previous visit (from the past 240 hour(s)).   Studies: No results found.  Scheduled Meds:  Scheduled Meds: . antiseptic oral rinse  7 mL Mouth Rinse q12n4p  . bisacodyl  10 mg Rectal Daily  . chlorhexidine  15 mL Mouth Rinse BID  . chlorproMAZINE (THORAZINE) injection  25 mg Intramuscular Once  . ertapenem (INVANZ) IV  1 g Intravenous Q24H  . lip balm  1 application Topical BID  . sodium chloride  10-40 mL Intracatheter Q12H  . traZODone  50 mg Oral QHS   Continuous Infusions: . sodium chloride 1,000 mL (11/10/14 1019)    Time spent on care of this patient: 35 min   Zapata, MD 11/10/2014, 1:13 PM  LOS: 3 days   [facility] Office  [phone] Pager - Text Page per [email or web address] If 7PM-7AM, please contact night-coverage [email or web address]

## 2014-11-10 NOTE — Consult Note (Addendum)
[facility] for [facility]ious [facility]  Total days of antibiotics 30        Day carbapenem (erta- mero)              Reason for Consult: retroperitoneal abscess   Referring Physician: rizwan  Principal Problem:   SBO (small bowel obstruction) Active Problems:   Hypertension   Hyponatremia   Hypoalbuminemia   Malnutrition of moderate degree    HPI: Darryl Griffith is a 69 y.o. male  with a history of left retroperitoneal liposarcoma with resection and left nephrectomy done 07/13/14 and radiation therapy who initially was hospitalized in May with fever and WBC of 12.2 and noted small bowel obstruction. In June 2016, he was admitted for retroperitoneal abscess found on CT that had fluid collection 4 x 9 x 10 cm which was drained and placed on long term antibiotics with ertapenem. He was to see Dr. Megan Salon for follow up in the ID clinic but has been readmitted on 8/9 for small bowel obstruction. Repeat scan did shows left retroperitoneal drainage catheter noted on prior exam has been removed. The fluid collection appears to have significantly enlarged compared to prior exam, now measuring 5.6 x 2.6 cm. This is consistent with worsening abscess vs. Sterile fluid collection? Patient states that he feels better than when he was admitted. He had a liquid consistent meal last night that he tolerated. He is concern that he has not had any flatulence this morning despite ambulating. No abdominal pain  Past Medical History  Diagnosis Date  . Hypertension   . Elevated cholesterol   . Anginal pain     hx of CP saw Dr Wynonia Lawman 09/20/2013 H&P per chart   . Left Retroperitoneal mass s/p resection/L nephrectomy 07/13/2014   . Postoperative anemia due to acute blood loss 07/15/2014  . SBO (small bowel obstruction) 08/01/2014, 10/11/14  . Retroperitoneal fluid collection   . 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 Compazine [Prochlorperazine] Other (See Comments)    Made pt really lethargic, and sleepy    MEDICATIONS: . antiseptic oral rinse  7 mL Mouth Rinse q12n4p  . bisacodyl  10 mg Rectal Daily  . chlorhexidine  15 mL Mouth Rinse BID  . chlorproMAZINE (THORAZINE) injection  25 mg Intramuscular Once  . ertapenem (INVANZ) IV  1 g Intravenous Q24H  . lip balm  1 application Topical BID  . sodium chloride  10-40 mL Intracatheter Q12H  . traZODone  50 mg Oral QHS    Social History  Substance Use Topics  . Smoking status: Never Smoker   . Smokeless tobacco: Never Used  . Alcohol Use: No    Family History  Problem Relation Age of Onset  . Adopted: Yes  . Family history unknown: Yes    Review of Systems - Review of Systems  Constitutional: Negative for fever, chills, diaphoresis, activity change, appetite change, fatigue and unexpected weight change.  HENT: Negative for congestion, sore throat, rhinorrhea, sneezing, trouble swallowing and sinus pressure.  Eyes: Negative for photophobia and visual disturbance.  Respiratory: Negative for cough, chest tightness, shortness of breath, wheezing and stridor.  Cardiovascular: Negative for chest pain, palpitations and leg swelling.  Gastrointestinal:  + not passing gas. Negative for nausea, vomiting, abdominal pain, diarrhea, constipation, blood in stool, abdominal distention and anal bleeding.  Genitourinary: Negative for dysuria, hematuria, flank pain and  difficulty urinating.  Musculoskeletal: Negative for myalgias, back pain, joint swelling, arthralgias and gait problem.  Skin: Negative for color change, pallor, rash and wound.  Neurological: Negative for dizziness, tremors, weakness and light-headedness.  Hematological: Negative for adenopathy. Does not bruise/bleed easily.  Psychiatric/Behavioral: Negative for behavioral problems, confusion, sleep disturbance, dysphoric mood, decreased concentration and  agitation.     OBJECTIVE: Temp:  [98.4 F (36.9 C)-98.6 F (37 C)] 98.4 F (36.9 C) (08/12 0544) Pulse Rate:  [96-103] 98 (08/12 0544) Resp:  [18] 18 (08/12 0544) BP: (129-147)/(70-74) 146/70 mmHg (08/12 0544) SpO2:  [97 %-98 %] 97 % (08/12 0544) Physical Exam  Constitutional: He is oriented to person, place, and time. He appears well-developed and well-nourished. No distress.  HENT:  Mouth/Throat: Oropharynx is clear and moist. No oropharyngeal exudate.  Cardiovascular: Normal rate, regular rhythm and normal heart sounds. Exam reveals no gallop and no friction rub.  No murmur heard.  Pulmonary/Chest: Effort normal and breath sounds normal. No respiratory distress. He has no wheezes.  Abdominal: Soft. Bowel sounds are delayed, but present He exhibits no distension. There is no tenderness.  Lymphadenopathy:  He has no cervical adenopathy.  Neurological: He is alert and oriented to person, place, and time.  Back = has firm, raised area near left flank from prior drain placements Skin: Skin is warm and dry. No rash noted. No erythema. C/d/i picc line on RUE Psychiatric: He has a normal mood and affect. His behavior is normal.     LABS: Results for orders placed or performed during the hospital encounter of 11/07/14 (from the past 48 hour(s))  CBC WITH DIFFERENTIAL     Status: Abnormal   Collection Time: 11/09/14  5:35 AM  Result Value Ref Range   WBC 8.8 4.0 - 10.5 K/uL   RBC 3.19 (L) 4.22 - 5.81 MIL/uL   Hemoglobin 8.7 (L) 13.0 - 17.0 g/dL   HCT 26.9 (L) 39.0 - 52.0 %   MCV 84.3 78.0 - 100.0 fL   MCH 27.3 26.0 - 34.0 pg   MCHC 32.3 30.0 - 36.0 g/dL   RDW 16.7 (H) 11.5 - 15.5 %   Platelets 462 (H) 150 - 400 K/uL   Neutrophils Relative % 86 (H) 43 - 77 %   Neutro Abs 7.5 1.7 - 7.7 K/uL   Lymphocytes Relative 7 (L) 12 - 46 %   Lymphs Abs 0.7 0.7 - 4.0 K/uL   Monocytes Relative 5 3 - 12 %   Monocytes Absolute 0.4 0.1 - 1.0 K/uL   Eosinophils Relative 2 0 - 5 %    Eosinophils Absolute 0.1 0.0 - 0.7 K/uL   Basophils Relative 0 0 - 1 %   Basophils Absolute 0.0 0.0 - 0.1 K/uL  Comprehensive metabolic panel     Status: Abnormal   Collection Time: 11/09/14  5:35 AM  Result Value Ref Range   Sodium 137 135 - 145 mmol/L   Potassium 3.7 3.5 - 5.1 mmol/L   Chloride 105 101 - 111 mmol/L   CO2 21 (L) 22 - 32 mmol/L   Glucose, Bld 78 65 - 99 mg/dL   BUN 13 6 - 20 mg/dL   Creatinine, Ser 1.07 0.61 - 1.24 mg/dL   Calcium 8.4 (L) 8.9 - 10.3 mg/dL   Total Protein 5.7 (L) 6.5 - 8.1 g/dL   Albumin 2.3 (L) 3.5 - 5.0 g/dL   AST 15 15 - 41 U/L   ALT 10 (L) 17 - 63 U/L   Alkaline  Phosphatase 86 38 - 126 U/L   Total Bilirubin 0.5 0.3 - 1.2 mg/dL   GFR calc non Af Amer >60 >60 mL/min   GFR calc Af Amer >60 >60 mL/min    Comment: (NOTE) The eGFR has been calculated using the CKD EPI equation. This calculation has not been validated in all clinical situations. eGFR's persistently <60 mL/min signify possible Chronic Kidney Disease.    Anion gap 11 5 - 15  CBC WITH DIFFERENTIAL     Status: Abnormal   Collection Time: 11/10/14  4:48 AM  Result Value Ref Range   WBC 7.7 4.0 - 10.5 K/uL   RBC 3.36 (L) 4.22 - 5.81 MIL/uL   Hemoglobin 8.7 (L) 13.0 - 17.0 g/dL   HCT 28.1 (L) 39.0 - 52.0 %   MCV 83.6 78.0 - 100.0 fL   MCH 25.9 (L) 26.0 - 34.0 pg   MCHC 31.0 30.0 - 36.0 g/dL   RDW 16.8 (H) 11.5 - 15.5 %   Platelets 492 (H) 150 - 400 K/uL   Neutrophils Relative % 82 (H) 43 - 77 %   Neutro Abs 6.4 1.7 - 7.7 K/uL   Lymphocytes Relative 7 (L) 12 - 46 %   Lymphs Abs 0.6 (L) 0.7 - 4.0 K/uL   Monocytes Relative 9 3 - 12 %   Monocytes Absolute 0.7 0.1 - 1.0 K/uL   Eosinophils Relative 2 0 - 5 %   Eosinophils Absolute 0.2 0.0 - 0.7 K/uL   Basophils Relative 0 0 - 1 %   Basophils Absolute 0.0 0.0 - 0.1 K/uL  Comprehensive metabolic panel     Status: Abnormal   Collection Time: 11/10/14  4:48 AM  Result Value Ref Range   Sodium 139 135 - 145 mmol/L   Potassium 3.8 3.5  - 5.1 mmol/L   Chloride 105 101 - 111 mmol/L   CO2 23 22 - 32 mmol/L   Glucose, Bld 88 65 - 99 mg/dL   BUN 9 6 - 20 mg/dL   Creatinine, Ser 1.02 0.61 - 1.24 mg/dL   Calcium 8.7 (L) 8.9 - 10.3 mg/dL   Total Protein 6.3 (L) 6.5 - 8.1 g/dL   Albumin 2.4 (L) 3.5 - 5.0 g/dL   AST 16 15 - 41 U/L   ALT 11 (L) 17 - 63 U/L   Alkaline Phosphatase 89 38 - 126 U/L   Total Bilirubin 0.5 0.3 - 1.2 mg/dL   GFR calc non Af Amer >60 >60 mL/min   GFR calc Af Amer >60 >60 mL/min    Comment: (NOTE) The eGFR has been calculated using the CKD EPI equation. This calculation has not been validated in all clinical situations. eGFR's persistently <60 mL/min signify possible Chronic Kidney Disease.    Anion gap 11 5 - 15    MICRO:  none IMAGING: Dg Abd Portable 1v-small Bowel Obstruction Protocol-initial, 8 Hr Delay  11/08/2014   CLINICAL DATA:  Small bowel obstruction protocol. Contrast administered at 0345 hours.  EXAM: PORTABLE ABDOMEN - 1 VIEW  COMPARISON:  CT and radiographs 11/07/2014.  FINDINGS: 1156 hours. Nasogastric tube is visualized within the gastric fundus. There is a small amount of contrast within the stomach and distal small bowel which remains dilated. Contrast is also present within the colon, extending to the rectum, increased in density from yesterday, implying that some of the recently administered contrast has passed into the colon. There is still moderate diffuse small bowel distension. Multiple left abdominal surgical clips are noted. No  evidence of free intraperitoneal air or contrast extravasation.  IMPRESSION: Persistent moderate small bowel distension in excess of the colon. However, the volume of contrast in the colon has increased from yesterday's radiographs, implying some passage of contrast through the small bowel.   Electronically Signed   By: Richardean Sale M.D.   On: 11/08/2014 12:31   CT:  CLINICAL DATA: Acute epigastric abdominal pain, vomiting.  EXAM: CT ABDOMEN AND  PELVIS WITH CONTRAST  TECHNIQUE: Multidetector CT imaging of the abdomen and pelvis was performed using the standard protocol following bolus administration of intravenous contrast.  CONTRAST: 23m OMNIPAQUE IOHEXOL 300 MG/ML SOLN, 1067mOMNIPAQUE IOHEXOL 300 MG/ML SOLN  COMPARISON: CT scan of October 31, 2014.  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 Minimal left posterior basilar subsegmental atelectasis is noted.  No gallstones are noted. The liver and pancreas appear normal. Spleen is unchanged. Left retroperitoneal drainage catheter noted on prior exam has been removed. The fluid collection appears to have significantly enlarged compared to prior exam, now measuring 5.6 x 2.6 cm. This is consistent with worsening abscess. Surgical clips are noted in the left retroperitoneal region which are unchanged compared to prior exam. Status post left nephrectomy. Right adrenal gland and kidney appear normal. Left adrenal gland is not clearly visualized. Stable 3.3 x 2.7 cm lesser sac fluid collection is noted. 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 There is significantly increased small bowel dilatation with transition zone seen in the left upper quadrant in area of previously described surgery, best seen on image number 37 of series 2. More distal small bowel is normal in caliber. Sigmoid diverticulosis is noted without inflammation. Urinary bladder appears normal. No significant adenopathy is noted.    Assessment/Plan:  [age]o M with a history of left retroperitoneal liposarcoma with resection and left nephrectomy done 07/13/14 and radiation therapy with worsening fluid collection for abscess while on antibiotics  - recommend to have patient evaluated by IR to ASPIRATE fluid collection. After discussion with IR, fluid collection is smaller than prior, and concern that placing a drain could cause  track that would be very slow to heal. For now will just do aspirate and - please send fluid for gram stain and culture - continue on carbapenems while in-patient and can send out on ertapenem as outpatient - if culture shows other pathogens, may need to change antibiotic recommendation - recommend to continue him with abtx for 2-3 wk until next scan to ensure that fluid collection is smaller and resolving (once drained)  sbo = appears resolved, defer to surgery for furhter management  Moderate malnutrition = consider supplementation for ensure/other protein drinks  Will arrange follow up with dr. CaMegan Salonn 2-3 wk. Will have Dr. VaLucianne Leiam follow up on culture results to see if need to change antibiotics over the weekend (this does not have to keep the patient inpatient. Can change abtx as o/p)  These findings discussed with dr. riDonnal Moat. [facility] [phone]

## 2014-11-14 ENCOUNTER — Other Ambulatory Visit: Payer: Self-pay | Admitting: General Surgery

## 2014-11-14 ENCOUNTER — Telehealth: Payer: Self-pay | Admitting: Infectious Disease

## 2014-11-14 DIAGNOSIS — C480 Malignant neoplasm of retroperitoneum: Secondary | ICD-10-CM

## 2014-11-14 NOTE — Telephone Encounter (Signed)
Patients culture growing enterococcus (for which he has zero coverage with his Invanz. 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

## 2014-11-15 ENCOUNTER — Telehealth: Payer: Self-pay | Admitting: Infectious Disease

## 2014-11-15 LAB — CULTURE, ROUTINE-ABSCESS
Gram Stain: NONE SEEN
Special Requests: NORMAL

## 2014-11-15 MED ORDER — METRONIDAZOLE 500 MG PO TABS: 500.0000 mg | ORAL_TABLET | Freq: Three times a day (TID) | ORAL | Status: DC

## 2014-11-15 MED ORDER — LINEZOLID 600 MG PO TABS: 600.0000 mg | ORAL_TABLET | Freq: Two times a day (BID) | ORAL | Status: DC

## 2014-11-15 NOTE — Telephone Encounter (Signed)
[facility] checking on cost for IV ABX, daptomycin.  Will return call with the price.

## 2014-11-15 NOTE — Telephone Encounter (Signed)
thx Langley Gauss I checked with them too. We are going to see what zyvox and flagyl costs via [facility] pharmacy

## 2014-11-15 NOTE — Telephone Encounter (Signed)
Patient would have to pay 1600 per week for cubicin at 8mg /kg  I am having [facility] investigate what it would cost him for a month of generic linezolid. Perhaps he can get reduced pricing from our pharmacy and perhaps his rx drug plan will actually cover more of the med.  Sending rx for this plus flagyl for one month to [facility].  Discussed with Onnie Boer.

## 2014-11-15 NOTE — Telephone Encounter (Signed)
I want to see what [facility] finds if they price out IV daptomycin for this man at dose of 8mg /kg daily for 4 weeks  If the copay for him is going to be very high I will go with oral zyvox  Can someone call [facility] and find out how much he would pay and if the copay is acceptable to him I would like to switch him to it  If not we will go with zyvox, possibly via [facility]  I am not able to find a good phone line to call [facility] I am being put on hold for 30 minutes plus. Is there an RN/MD line for them?

## 2014-11-16 ENCOUNTER — Other Ambulatory Visit: Payer: Self-pay

## 2014-11-16 ENCOUNTER — Telehealth: Payer: Self-pay

## 2014-11-16 ENCOUNTER — Telehealth: Payer: Self-pay | Admitting: Infectious Disease

## 2014-11-16 ENCOUNTER — Ambulatory Visit
Admission: RE | Admit: 2014-11-16 | Discharge: 2014-11-16 | Disposition: A | Discharge status: Home / Self Care | Payer: Medicare Other | Source: Ambulatory Visit | Attending: Radiation Oncology | Admitting: Radiation Oncology

## 2014-11-16 ENCOUNTER — Encounter: Payer: Self-pay | Admitting: Radiation Oncology

## 2014-11-16 ENCOUNTER — Telehealth: Payer: Self-pay | Admitting: Pharmacist Clinician (PhC)/ Clinical Pharmacy Specialist

## 2014-11-16 VITALS — BP 90/49 | HR 127 | Temp 98.2°F | Resp 16 | Wt 162.0 lb

## 2014-11-16 DIAGNOSIS — C480 Malignant neoplasm of retroperitoneum: Secondary | ICD-10-CM

## 2014-11-16 LAB — ANAEROBIC CULTURE
GRAM STAIN: NONE SEEN
SPECIAL REQUESTS: NORMAL

## 2014-11-16 MED ORDER — SODIUM CHLORIDE 0.9 % IV SOLN: INTRAVENOUS | Status: DC

## 2014-11-16 NOTE — Telephone Encounter (Signed)
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 He was supposed to be getting daptomycin but the cost is tremendous since he is in the donut hole. Even with linezolid, the price is still significant. We were able to discuss his case with [facility] and he can get the 28 days of linezolid for an affordable price. Called him and he can afford that price.

## 2014-11-16 NOTE — Telephone Encounter (Signed)
This guys central line needs to come out. Can we have [facility] pull it?  Jac Canavan

## 2014-11-16 NOTE — Telephone Encounter (Signed)
Patient is calling about medications at [facility].   He was not able to complete call at this time and would call back.

## 2014-11-16 NOTE — Progress Notes (Signed)
Radiation Oncology         [phone] ________________________________  Name: Mell Guia MRN: [id number]  Date: 11/16/2014  DOB: 04/29/1945  Follow-Up Visit Note  CC: Gennette Pac, MD  Wyatt Portela, MD  Diagnosis: Kurt Hoffmeier is a 69 year old gentleman presenting to clinic in regards ot his malignant neoplasm of retroperitoneum.  Interval Since Last Radiation:  6  weeks. 08/14/2014-10/03/2014 Site/dose:    1.  The original tumor site, tumor bed were initially treated to 50 Gy in 25 fractions of 2 Gy. 2.  The tumor bed, guided by surgical clips was boosted, excluding the bowel was treated to 66 Gy with 8 fractions of 2 Gy.  Narrative:  The patient returns today for routine follow-up. Patient orthostatically hypotensive. Reports he continues to take Zofran daily to manage nausea and vomiting. Reports each night he feels nauseated and vomits prior to dinner. Reports he is able to eat a regular diet with the aid of Zofran. Deneis reflux. Denies taking reglan. Reports he passed two firm BMs yesterday. Reports a lot of bloating, gas pressure, and abdominal cramping. Raised nodule noted proximal left clavicle. Denies pain. Recently discharged from the hospital following third bowel obstruction. 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 Patient picking of antibiotics today prescribed by Dr. Tommy Medal.                      ALLERGIES:  is allergic to compazine.  Meds: Current Outpatient Prescriptions  Medication Sig Dispense Refill  . Melatonin 5 MG CAPS Take 1 capsule by mouth at bedtime.     . Multiple Vitamin (MULTIVITAMIN WITH MINERALS) TABS tablet Take 1 tablet by mouth every morning.     . ondansetron (ZOFRAN ODT) 8 MG disintegrating tablet Take 1 tablet (8 mg total) by mouth 3 (three) times daily before meals. 12 tablet 0  . pantoprazole (PROTONIX) 40 MG tablet Take 1 tablet (40 mg total) by mouth daily. 60 tablet 0  . traZODone (DESYREL) 50 MG tablet Take 50 mg by mouth at  bedtime.     . ferrous sulfate 325 (65 FE) MG tablet TAKE 1 TABLET (325 MG TOTAL) BY MOUTH 2 (TWO) TIMES DAILY WITH A MEAL.  0  . linezolid (ZYVOX) 600 MG tablet Take 1 tablet (600 mg total) by mouth 2 (two) times daily. (Patient not taking: Reported on 11/16/2014) 56 tablet 0  . LORazepam (ATIVAN) 1 MG tablet Take 1 tablet by mouth 3 (three) times daily as needed for anxiety. Anxiety/nausea  0  . metroNIDAZOLE (FLAGYL) 500 MG tablet Take 1 tablet (500 mg total) by mouth 3 (three) times daily. (Patient not taking: Reported on 11/16/2014) 90 tablet 0  . promethazine (PHENERGAN) 12.5 MG tablet Take 12.5 mg by mouth every 6 (six) hours as needed.  0   No current facility-administered medications for this encounter.    Physical Findings: The patient is alert and oriented. In no distress.  weight is 162 lb (73.483 kg). His oral temperature is 98.2 F (36.8 C). His blood pressure is 90/49 and his pulse is 127. His respiration is 16 and oxygen saturation is 100%.  Left proximal clavicle appears more prominent perhaps from muscular atrophy and asymetry.  Lab Findings: Lab Results  Component Value Date   WBC 7.7 11/10/2014   WBC 6.4 09/18/2014   HGB 8.7* 11/10/2014   HGB 6.7* 09/18/2014   HCT 28.1* 11/10/2014   HCT 20.4* 09/18/2014   PLT 492* 11/10/2014  PLT 301 09/18/2014    Lab Results  Component Value Date   NA 139 11/10/2014   NA 131* 09/18/2014   K 3.8 11/10/2014   K 4.4 09/18/2014   CHLORIDE 98 09/18/2014   CO2 23 11/10/2014   CO2 26 09/18/2014   GLUCOSE 88 11/10/2014   GLUCOSE 159* 09/18/2014   BUN 9 11/10/2014   BUN 38.6* 09/18/2014   CREATININE 1.02 11/10/2014   CREATININE 1.0 09/18/2014   BILITOT 0.5 11/10/2014   BILITOT <0.20 09/18/2014   ALKPHOS 89 11/10/2014   ALKPHOS 65 09/18/2014   AST 16 11/10/2014   AST 17 09/18/2014   ALT 11* 11/10/2014   ALT 12 09/18/2014   PROT 6.3* 11/10/2014   PROT 5.3* 09/18/2014   ALBUMIN 2.4* 11/10/2014   ALBUMIN 2.4* 09/18/2014    CALCIUM 8.7* 11/10/2014   CALCIUM 8.8 09/18/2014   ANIONGAP 11 11/10/2014   ANIONGAP 6 09/18/2014    Radiographic Findings: Ct Abdomen Pelvis W Contrast  11/07/2014   CLINICAL DATA:  Acute epigastric abdominal pain, vomiting.  EXAM: CT ABDOMEN AND PELVIS WITH CONTRAST  TECHNIQUE: Multidetector CT imaging of the abdomen and pelvis was performed using the standard protocol following bolus administration of intravenous contrast.  CONTRAST:  77mL OMNIPAQUE IOHEXOL 300 MG/ML SOLN, 1104mL OMNIPAQUE IOHEXOL 300 MG/ML SOLN  COMPARISON:  CT scan of October 31, 2014.  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 Minimal left posterior basilar subsegmental atelectasis is noted.  No gallstones are noted. The liver and pancreas appear normal. Spleen is unchanged. Left retroperitoneal drainage catheter noted on prior exam has been removed. The fluid collection appears to have significantly enlarged compared to prior exam, now measuring 5.6 x 2.6 cm. This is consistent with worsening abscess. Surgical clips are noted in the left retroperitoneal region which are unchanged compared to prior exam. Status post left nephrectomy. Right adrenal gland and kidney appear normal. Left adrenal gland is not clearly visualized. Stable 3.3 x 2.7 cm lesser sac fluid collection is noted. 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 There is significantly increased small bowel dilatation with transition zone seen in the left upper quadrant in area of previously described surgery, best seen on image number 37 of series 2. More distal small bowel is normal in caliber. Sigmoid diverticulosis is noted without inflammation. Urinary bladder appears normal. No significant adenopathy is noted.  IMPRESSION: Extensive postsurgical changes are noted in left upper quadrant consistent with prior left nephrectomy. There is significantly increased proximal small bowel dilatation with  transition zone seen in the left upper quadrant adjacent to postoperative changes, consistent with small bowel obstruction, most likely due to adhesion or scarring.  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  Left retroperitoneal drainage catheter noted on prior exam has been removed in the interval. Significantly increased fluid collection containing gas is seen in this area consistent with abscess.  Stable abscess or fluid collection is seen in the lesser sac.   Electronically Signed   By: Marijo Conception, M.D.   On: 11/07/2014 19:37   Ct Abdomen Pelvis W Contrast  10/31/2014   CLINICAL DATA:  Follow-up retroperitoneal abscess. 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  EXAM: CT ABDOMEN AND PELVIS WITH CONTRAST  TECHNIQUE: Multidetector CT imaging of the abdomen and pelvis was performed using the standard protocol following bolus administration of intravenous contrast.  CONTRAST:  143mL ISOVUE-300 IOPAMIDOL (ISOVUE-300) INJECTION 61%  COMPARISON:  10/19/2014  FINDINGS: There is a small residual left pleural effusion, decreased from prior. Mild left basilar atelectasis is noted.  Subcentimeter low-density lesions in the right hepatic dome and near the gallbladder fossa are unchanged and too small to characterize. The gallbladder, right adrenal gland, and pancreas are unremarkable. 1.2 cm low-density lesion in the right kidney is unchanged and compatible with a cyst. The left adrenal gland is not identified. Sequelae of left nephrectomy are again identified.  A left-sided retroperitoneal pigtail drainage catheter remains in place. A small amount of fluid and gas remain in the left upper quadrant adjacent to the drainage catheter and along the medial aspect of the spleen, similar to the prior study. Small abscess immediately inferior to the drainage catheter in the left psoas muscle measures approximately 19 x 11 mm, stable to minimally  larger than on the prior study (series 3, image 34). Retroperitoneal soft tissue thickening/phlegmon extends anteriorly along the left aspect of the aorta and posterior to the pancreas, similar to prior.  Multiple small rim enhancing abscesses situated between the spleen and stomach do not appear significantly changed (series 3, image 19). Residual dominant fluid collection in the lesser sac measures 3.6 x 2.9 cm, unchanged (series 3, image 24). No new fluid collection is identified.  Small bowel dilatation on the prior study has largely resolved, without current evidence of high-grade bowel obstruction. A loop of small bowel remains borderline dilated and mildly thick walled in the left upper quadrant with a transition to decompressed small bowel near the drainage catheter. A few small bowel loops in the right mid abdomen remain borderline dilated measuring up to 3 cm in size, improved from prior.  There is mild wall thickening versus underdistention of multiple small bowel loops in the lower abdomen/ pelvis. There is persistent, mild to moderate wall thickening involving a portion of the distal left transverse colon which is inseparable to the 3.6 cm abscess described above. There is also some persistent wall thickening of the proximal descending colon which is in close proximity to the abscess drain. Sigmoid colonic diverticulosis is again seen with persistent, mild pericolonic stranding in this region, not significantly changed.  Moderate aortoiliac atherosclerotic calcification is noted. Bladder is unremarkable. There is at most trace free fluid in the pelvis. Trace perihepatic fluid on the prior study has resolved. Moderate to severe lower lumbar disc degeneration and facet arthrosis are noted.  IMPRESSION: 1. Unchanged 3.6 cm dominant lesser sac abscess with unchanged appearance of adjacent smaller abscesses. 2. Small amount of fluid and gas near the retroperitoneal drainage catheter, similar to prior. 1.9 cm  left psoas abscess is stable to at most minimally larger. 3. Improved/resolved small bowel obstruction. A few borderline dilated loops remain, and persistent partial obstruction is not excluded. 4. Persistent wall thickening of distal transverse and proximal descending colon near the abscesses. 5. No new collection.   Electronically Signed   By: Logan Bores   On: 10/31/2014 16:29   Ct Abdomen Pelvis W Contrast  10/19/2014   CLINICAL DATA:  69 year-old male who underwent a left nephrectomyand excision of retroperitoneal liposarcoma on 5/46/5035 complicated by development of a retroperitonealabscess. Percutaneous drain placed on 08/15/2014 , and drainage catheter removed on 09/26/2014. Recent presentation with nausea, vomiting and bloating and increased left retroperitoneal abscess with multiple smaller abscesses which communicate with the larger one and a small bowel obstruction with transition zone in the left upper abdomen. Status post repeat percutaneous left abdominal abscess drain placement on 10/12/2014.  Subsequent encounter.  EXAM: CT ABDOMEN AND PELVIS WITH CONTRAST  TECHNIQUE: Multidetector CT imaging of the abdomen and pelvis was performed using the standard protocol following bolus administration of intravenous contrast.  CONTRAST:  73mL OMNIPAQUE IOHEXOL 300 MG/ML  SOLN  COMPARISON:  Abdominal radiographs 10/16/2014 and earlier. CT drain placement 10/12/2014. CT Abdomen and Pelvis 10/12/2014, and earlier  FINDINGS: Stable small left pleural effusion. No pericardial effusion. Stable mild bibasilar atelectasis.  Stable visualized osseous structures.  Stable trace pelvic free fluid. Negative rectum. Negative urinary bladder.  Stable an mildly indistinct appearance of the sigmoid colon with underlying chronic diverticulosis.  The left colon now is decompressed. A portion of the left colon passes in proximity to the left retroperitoneal abscess and drain and remains moderately thickened. The splenic flexure  has a more normal appearance. However, the distal transverse colon in the left upper quadrant now appears abnormally thickened, an the wall appears edematous. The dependent wall appears more hypodense (series 2, image 30), and this is in proximity to 1 of the residual abscess components near the lesser sac. A segment of about 9 cm is affected. See also coronal image 16.  Upstream of that segment of the transverse colon and right colon appear normal. The terminal ileum is decompressed. There are multiple decompressed small bowel loops in the pelvis. There are multiple decompressed small bowel loops in the left lateral abdomen. There are upstream dilated small bowel loops with air and fluid levels, and once again the transition point appears to be the site of the left retroperitoneal abscess on series 2, image 37. Small bowel dilatation has slightly regressed  Since 10/12/2014. The stomach appears stable, including a dominant 3.7 cm lesser sac abscess (series 2, image 26), with evidence of smaller gastrosplenic ligament abscesses (image 22). The dominant abscess in the lesser sac no longer contains gas. The pancreas is abutted by the lesser sac abscess but otherwise enhances normally. No pancreatic ductal dilatation. The liver, gallbladder, right adrenal gland, and right kidney appear stable and within normal limits. Right renal contrast excretion an the right ureter appear within normal limits.  Posterior approach percutaneous retroperitoneal abscess drain remains in place with interval drainage of the dominant abscess (which measured up to 7.2 cm recently). Small volume residual left abscess adjacent to the left psoas muscle (series 2, image 38). Small volume residual gas. The spleen is abutted by these changes and appears stable. The left kidney is surgically absent, with numerous surgical clips also in the region.  The portal venous system appears patent. Aortoiliac calcified atherosclerosis noted. Major arterial  structures in the abdomen and pelvis are patent. Trace abdominal free fluid adjacent to the liver is stable. No pneumoperitoneum.  IMPRESSION: 1. Satisfactory drainage of the dominant left retroperitoneal abscess following drain replacement recently. However, multiple regional daughter abscesses remain, including the largest which is 3.7 cm in the lesser sac. 2. Persistent small bowel obstruction, with transition point abutting the dominant draining abscess. Small bowel dilatation perhaps slightly regressed in the last week. 3. Secondary inflammation of the descending colon near the dominant draining abscess. New 9 cm segment of thick walled distal transverse colon where sub serosal involvement by abscess is difficult to exclude (series 2, image 30). 4. Stable layering left pleural effusion and minor lung base atelectasis. Stable small perihepatic and pelvic free fluid.   Electronically Signed   By: Genevie Ann M.D.   On: 10/19/2014 11:31   Ct Aspiration  11/10/2014   CLINICAL DATA:  History  of recurrent left retroperitoneal fluid collection status post surgical excision of a retroperitoneal liposarcoma. Most recently, a drainage catheter was placed on 08/01/2014 and remained in place for 3 months with removal of the drain on 10/31/2014. Imaging on 11/07/2014 for abdominal pain demonstrated some reaccumulation of fluid in the previously drained medial left retroperitoneal space. Aspiration under CT guidance is now performed to determine if the fluid is infected.  EXAM: CT GUIDED ASPIRATION OF LEFT RETROPERITONEAL FLUID COLLECTION  ANESTHESIA/SEDATION: 3.0 Mg IV Versed 75 mcg IV Fentanyl  Total Moderate Sedation Time:  9 minutes.  PROCEDURE: The procedure, risks, benefits, and alternatives were explained to the patient. Questions regarding the procedure were encouraged and answered. The patient understands and consents to the procedure. A time-out was performed prior to the procedure.  The left flank region was  prepped with Betadine in a sterile fashion, and a sterile drape was applied covering the operative field. A sterile gown and sterile gloves were used for the procedure. Local anesthesia was provided with 1% Lidocaine.  CT was performed in a prone position. Under CT guidance, a 5 [place] Yueh centesis catheter was advanced over a 19 gauge needle into the left retroperitoneal fluid collection. Aspiration was performed through the [facility] catheter. The aspirated fluid sample was sent for culture analysis including a aerobic culture, anaerobic culture and fungal culture. Additional saline lavage was performed through the catheter with 10 mL of sterile saline. CT was performed after catheter removal.  COMPLICATIONS: None  FINDINGS: Aspiration yielded turbid yellow fluid. A total of 20 mL of fluid was able to be aspirated from the collection. After aspiration and additional saline lavage, fluid cavity size was smaller with a small amount of fluid remaining.  IMPRESSION: CT-guided aspiration and drainage of recurrent left retroperitoneal fluid collection. A 20 mL volume of turbid yellow fluid was able to be removed and was sent for culture analysis. Given chronicity of the collection and the presence of a drain in the collection for the last 3 months, aspiration only was performed to determine if the collection is infected prior to committing the patient to potential additional long-term catheter drainage or surgery.   Electronically Signed   By: Aletta Edouard M.D.   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  EXAM: ABSCESS INJECTION  Date: 10/31/2014  PROCEDURE: 1. Catheter injection under fluoroscopy Interventional Radiologist:  Criselda Peaches, MD  ANESTHESIA/SEDATION: None required  MEDICATIONS: None  FLUOROSCOPY TIME:  0 minutes 36 seconds  59 dGy  CONTRAST:  10 mL  Omnipaque 350  TECHNIQUE: An injection of contrast material under real-time fluoroscopic guidance demonstrates a collapsed abscess cavity about the drainage catheter. There is reflux of contrast material along the tube to the skin surface. No evidence of fistulization to adjacent large or small bowel.  COMPLICATIONS: None  IMPRESSION: 1. No evidence of fistulization. 2. Minimal residual collapsed abscess cavity about the drainage catheter. Injected contrast material refluxes along the tube to the skin surface. 3. The tube was removed.  Signed,  Criselda Peaches, MD  [facility]  [facility]   Electronically Signed   By: Jacqulynn Cadet M.D.   On: 10/31/2014 17:17   Dg Abd Portable 1v-small Bowel Obstruction Protocol-initial, 8 Hr Delay  11/08/2014   CLINICAL DATA:  Small bowel obstruction protocol. Contrast administered at 0345 hours.  EXAM: PORTABLE  ABDOMEN - 1 VIEW  COMPARISON:  CT and radiographs 11/07/2014.  FINDINGS: 1156 hours. Nasogastric tube is visualized within the gastric fundus. There is a small amount of contrast within the stomach and distal small bowel which remains dilated. Contrast is also present within the colon, extending to the rectum, increased in density from yesterday, implying that some of the recently administered contrast has passed into the colon. There is still moderate diffuse small bowel distension. Multiple left abdominal surgical clips are noted. No evidence of free intraperitoneal air or contrast extravasation.  IMPRESSION: Persistent moderate small bowel distension in excess of the colon. However, the volume of contrast in the colon has increased from yesterday's radiographs, implying some passage of contrast through the small bowel.   Electronically Signed   By: Richardean Sale M.D.   On: 11/08/2014 12:31   Dg Abd Portable 1v-small Bowel Protocol-position Verification  11/08/2014   CLINICAL DATA:  Nasogastric tube placement   EXAM: PORTABLE ABDOMEN - 1 VIEW  COMPARISON:  CT abdomen and pelvis November 07, 2014  FINDINGS: Nasogastric tube tip and side port are in the stomach. There are loops of dilated bowel indicating a degree of obstruction. No free air apparent. There are multiple surgical clips in the mid abdomen.  IMPRESSION: Nasogastric tube tip and side port in stomach. Dilated loops of bowel consistent with a degree of obstruction.   Electronically Signed   By: Lowella Grip III M.D.   On: 11/08/2014 01:12    Impression: Kirklin Mcduffee is a 69 year old gentleman s/p radiotherapy for sarcoma of the retroperitoneum. 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 I believe that the pt's prominent left clavicle could be the result from a loss of musculature and not cancer metastasis to the bone due to the absence of pain.  However, given the patient's persistent small left pleural effusion and prominent left proximal clavicle, a re-staging chest CT is warranted.  Plan: All questions vocalized by the patient and his wife were fully addressed. I discussed with the pt some of the many factors of his bowel dysmotility.  I will order a CT of the chest  next week. The pt is moving to [place] within 1-2 months. I explained the process of getting his medical chart and imaging released from [facility] to his new medical providers at [facility]. I will have the nurse contact [place] so the pt could receive IV fluids today. If he develops any further questions or concerns in regards to his treatment and recovery, he has been encouraged to contact me  This document serves as a record of services personally performed by Tyler Pita, MD. It was created on his behalf by Darcus Austin, a trained medical scribe. The creation of this record is based on the scribe's personal  observations and the provider's statements to them. This document has been checked and approved by the attending provider.     _____________________________________  [name], M.D.

## 2014-11-16 NOTE — Progress Notes (Signed)
Verbal order given by Dr. Tammi Klippel to arrange hydration of 1000 cc normal saline over 2 hours each day for the next five days. Phoned Delena Serve, RN of [facility] at [phone] and provided her with the order. She requested the order also be faxed to [facility] at [phone]. Order fax and confirmation of delivery fax obtained.

## 2014-11-16 NOTE — Addendum Note (Signed)
Addended by: Stark Klein on: 11/16/2014 11:59 AM   Modules accepted: Orders

## 2014-11-16 NOTE — Telephone Encounter (Signed)
[facility] handled this and pt to come pick it up at [place]  Can you call [facility] and ask them to pull pts PICC line?  He willl need to come get labs with [place] in next 2 weeks for CBC c diff

## 2014-11-16 NOTE — Progress Notes (Signed)
Weight stable. Patient orthostatic. Reports he continues to take Zofran daily to manage nausea and vomiting. Reports each night he feels nauseated and vomits prior to dinner. Reports he is able to eat a regular diet with the aid of Zofran. Deneis reflux. Denies taking reglan. Reports he passed two form BMs yesterday. Reports a lot of bloating and gas pressure. Raised nodule noted proximal left clavicle. Denies pain. Recently discharged from the hospital following third bowel obstruction. 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 Patient picking of antibiotics today prescribed by Dr. Tommy Medal.  BP 90/49 mmHg  Pulse 127  Temp(Src) 98.2 F (36.8 C) (Oral)  Resp 16  Wt 162 lb (73.483 kg)  SpO2 100% Wt Readings from Last 3 Encounters:  11/16/14 162 lb (73.483 kg)  11/07/14 161 lb 12.8 oz (73.392 kg)  10/26/14 163 lb 11.2 oz (74.254 kg)

## 2014-11-17 ENCOUNTER — Telehealth: Payer: Self-pay | Admitting: *Deleted

## 2014-11-17 ENCOUNTER — Ambulatory Visit (HOSPITAL_COMMUNITY): Admission: RE | Admit: 2014-11-17 | Payer: Medicare Other | Source: Ambulatory Visit

## 2014-11-17 NOTE — Telephone Encounter (Signed)
Shared Dr. Lucianne Lei Dam's pull PICC order with Amy, [facility] Pharmacist.  Amy shared that Dr. Tammi Klippel at [facility] has ordered 5 days of IV fluids for the pt.  RN advised Amy, [place], to contact Dr. Tommy Medal to let him know about the Fluid Order from Dr. Tammi Klippel.  Amy stated that she would call Dr. Tommy Medal.

## 2014-11-17 NOTE — Telephone Encounter (Signed)
Requesting a referral for care as he is moving soon.  Pt did not mention where he is moving.  Please call pt with recommendations.

## 2014-11-17 NOTE — Telephone Encounter (Signed)
Fine to give IVF but his PICC shouldn't stay in from my standpoint unless oncology need it. Then that is their call and their PICC

## 2014-11-17 NOTE — Telephone Encounter (Signed)
CALLED PATIENT TO INFORM OF SCAN AND FU, SPOKE WITH PATIENT AND HE IS AWARE OF THESE APPTS. 

## 2014-11-18 NOTE — Telephone Encounter (Signed)
I assume he is moving outside of [place]? If so I will have limited ability on who to recommend. Where is he moving?

## 2014-11-20 ENCOUNTER — Ambulatory Visit (HOSPITAL_COMMUNITY): Payer: Medicare Other

## 2014-11-20 NOTE — Addendum Note (Signed)
Addended by: Odis Hollingshead on: 11/20/2014 10:17 AM   Modules accepted: Orders

## 2014-11-21 ENCOUNTER — Ambulatory Visit (HOSPITAL_COMMUNITY): Payer: Medicare Other

## 2014-11-22 ENCOUNTER — Ambulatory Visit (HOSPITAL_COMMUNITY): Payer: Medicare Other

## 2014-11-23 ENCOUNTER — Ambulatory Visit (HOSPITAL_COMMUNITY)
Admission: RE | Admit: 2014-11-23 | Discharge: 2014-11-23 | Disposition: A | Discharge status: Home / Self Care | Payer: Medicare Other | Source: Ambulatory Visit | Attending: General Surgery | Admitting: General Surgery

## 2014-11-23 ENCOUNTER — Ambulatory Visit (HOSPITAL_COMMUNITY): Payer: Medicare Other

## 2014-11-23 DIAGNOSIS — C480 Malignant neoplasm of retroperitoneum: Secondary | ICD-10-CM | POA: Insufficient documentation

## 2014-11-23 DIAGNOSIS — R112 Nausea with vomiting, unspecified: Secondary | ICD-10-CM | POA: Diagnosis not present

## 2014-11-23 DIAGNOSIS — K5660 Unspecified intestinal obstruction: Secondary | ICD-10-CM | POA: Diagnosis not present

## 2014-11-23 MED ORDER — IOHEXOL 300 MG/ML  SOLN
100.0000 mL | Freq: Once | INTRAMUSCULAR | Status: AC | PRN
  Administered 2014-11-23: 100 mL via INTRAVENOUS

## 2014-11-23 MED ORDER — IOHEXOL 300 MG/ML  SOLN
50.0000 mL | Freq: Once | INTRAMUSCULAR | Status: AC | PRN
  Administered 2014-11-23: 50 mL via ORAL

## 2014-11-24 ENCOUNTER — Inpatient Hospital Stay (HOSPITAL_COMMUNITY)
Admission: EM | Admit: 2014-11-24 | Discharge: 2014-12-17 | DRG: 329 | Disposition: A | Discharge status: Skilled Nursing Facility | Payer: Medicare Other | Attending: Internal Medicine | Admitting: Internal Medicine

## 2014-11-24 ENCOUNTER — Emergency Department (HOSPITAL_COMMUNITY): Payer: Medicare Other

## 2014-11-24 ENCOUNTER — Encounter (HOSPITAL_COMMUNITY): Payer: Self-pay | Admitting: Emergency Medicine

## 2014-11-24 ENCOUNTER — Telehealth: Payer: Self-pay | Admitting: Radiation Oncology

## 2014-11-24 DIAGNOSIS — Z905 Acquired absence of kidney: Secondary | ICD-10-CM | POA: Diagnosis present

## 2014-11-24 DIAGNOSIS — D62 Acute posthemorrhagic anemia: Secondary | ICD-10-CM | POA: Diagnosis present

## 2014-11-24 DIAGNOSIS — R197 Diarrhea, unspecified: Secondary | ICD-10-CM | POA: Diagnosis not present

## 2014-11-24 DIAGNOSIS — K56600 Partial intestinal obstruction, unspecified as to cause: Secondary | ICD-10-CM

## 2014-11-24 DIAGNOSIS — E782 Mixed hyperlipidemia: Secondary | ICD-10-CM | POA: Diagnosis present

## 2014-11-24 DIAGNOSIS — K660 Peritoneal adhesions (postprocedural) (postinfection): Secondary | ICD-10-CM | POA: Diagnosis present

## 2014-11-24 DIAGNOSIS — D638 Anemia in other chronic diseases classified elsewhere: Secondary | ICD-10-CM | POA: Diagnosis not present

## 2014-11-24 DIAGNOSIS — J810 Acute pulmonary edema: Secondary | ICD-10-CM | POA: Diagnosis not present

## 2014-11-24 DIAGNOSIS — I10 Essential (primary) hypertension: Secondary | ICD-10-CM | POA: Diagnosis not present

## 2014-11-24 DIAGNOSIS — R5383 Other fatigue: Secondary | ICD-10-CM | POA: Diagnosis not present

## 2014-11-24 DIAGNOSIS — Z6824 Body mass index (BMI) 24.0-24.9, adult: Secondary | ICD-10-CM

## 2014-11-24 DIAGNOSIS — E440 Moderate protein-calorie malnutrition: Secondary | ICD-10-CM | POA: Diagnosis present

## 2014-11-24 DIAGNOSIS — K567 Ileus, unspecified: Secondary | ICD-10-CM | POA: Diagnosis not present

## 2014-11-24 DIAGNOSIS — Z923 Personal history of irradiation: Secondary | ICD-10-CM

## 2014-11-24 DIAGNOSIS — F419 Anxiety disorder, unspecified: Secondary | ICD-10-CM | POA: Diagnosis present

## 2014-11-24 DIAGNOSIS — D649 Anemia, unspecified: Secondary | ICD-10-CM

## 2014-11-24 DIAGNOSIS — Z888 Allergy status to other drugs, medicaments and biological substances status: Secondary | ICD-10-CM | POA: Diagnosis not present

## 2014-11-24 DIAGNOSIS — F8081 Childhood onset fluency disorder: Secondary | ICD-10-CM

## 2014-11-24 DIAGNOSIS — K56609 Unspecified intestinal obstruction, unspecified as to partial versus complete obstruction: Secondary | ICD-10-CM | POA: Diagnosis present

## 2014-11-24 DIAGNOSIS — T888XXD Other specified complications of surgical and medical care, not elsewhere classified, subsequent encounter: Secondary | ICD-10-CM

## 2014-11-24 DIAGNOSIS — E860 Dehydration: Secondary | ICD-10-CM | POA: Diagnosis not present

## 2014-11-24 DIAGNOSIS — Z79899 Other long term (current) drug therapy: Secondary | ICD-10-CM

## 2014-11-24 DIAGNOSIS — L89152 Pressure ulcer of sacral region, stage 2: Secondary | ICD-10-CM | POA: Diagnosis present

## 2014-11-24 DIAGNOSIS — F329 Major depressive disorder, single episode, unspecified: Secondary | ICD-10-CM | POA: Diagnosis present

## 2014-11-24 DIAGNOSIS — Z4659 Encounter for fitting and adjustment of other gastrointestinal appliance and device: Secondary | ICD-10-CM

## 2014-11-24 DIAGNOSIS — R627 Adult failure to thrive: Secondary | ICD-10-CM | POA: Diagnosis present

## 2014-11-24 DIAGNOSIS — D500 Iron deficiency anemia secondary to blood loss (chronic): Secondary | ICD-10-CM | POA: Diagnosis present

## 2014-11-24 DIAGNOSIS — L02212 Cutaneous abscess of back [any part, except buttock]: Secondary | ICD-10-CM | POA: Diagnosis present

## 2014-11-24 DIAGNOSIS — R34 Anuria and oliguria: Secondary | ICD-10-CM | POA: Diagnosis not present

## 2014-11-24 DIAGNOSIS — C480 Malignant neoplasm of retroperitoneum: Secondary | ICD-10-CM | POA: Diagnosis present

## 2014-11-24 DIAGNOSIS — E871 Hypo-osmolality and hyponatremia: Secondary | ICD-10-CM | POA: Diagnosis not present

## 2014-11-24 DIAGNOSIS — Z1621 Resistance to vancomycin: Secondary | ICD-10-CM | POA: Diagnosis present

## 2014-11-24 DIAGNOSIS — Y842 Radiological procedure and radiotherapy as the cause of abnormal reaction of the patient, or of later complication, without mention of misadventure at the time of the procedure: Secondary | ICD-10-CM | POA: Diagnosis present

## 2014-11-24 DIAGNOSIS — K6819 Other retroperitoneal abscess: Secondary | ICD-10-CM | POA: Diagnosis present

## 2014-11-24 DIAGNOSIS — L98429 Non-pressure chronic ulcer of back with unspecified severity: Secondary | ICD-10-CM | POA: Diagnosis present

## 2014-11-24 DIAGNOSIS — E876 Hypokalemia: Secondary | ICD-10-CM | POA: Diagnosis present

## 2014-11-24 DIAGNOSIS — T888XXS Other specified complications of surgical and medical care, not elsewhere classified, sequela: Secondary | ICD-10-CM | POA: Diagnosis not present

## 2014-11-24 DIAGNOSIS — T888XXA Other specified complications of surgical and medical care, not elsewhere classified, initial encounter: Secondary | ICD-10-CM | POA: Diagnosis present

## 2014-11-24 DIAGNOSIS — E43 Unspecified severe protein-calorie malnutrition: Secondary | ICD-10-CM | POA: Diagnosis present

## 2014-11-24 DIAGNOSIS — K5669 Other intestinal obstruction: Secondary | ICD-10-CM | POA: Diagnosis not present

## 2014-11-24 DIAGNOSIS — D702 Other drug-induced agranulocytosis: Secondary | ICD-10-CM | POA: Diagnosis present

## 2014-11-24 DIAGNOSIS — B952 Enterococcus as the cause of diseases classified elsewhere: Secondary | ICD-10-CM | POA: Diagnosis present

## 2014-11-24 DIAGNOSIS — R112 Nausea with vomiting, unspecified: Secondary | ICD-10-CM | POA: Diagnosis present

## 2014-11-24 DIAGNOSIS — D72819 Decreased white blood cell count, unspecified: Secondary | ICD-10-CM | POA: Diagnosis not present

## 2014-11-24 DIAGNOSIS — F32A Depression, unspecified: Secondary | ICD-10-CM | POA: Diagnosis present

## 2014-11-24 DIAGNOSIS — Z9889 Other specified postprocedural states: Secondary | ICD-10-CM | POA: Diagnosis not present

## 2014-11-24 DIAGNOSIS — K9189 Other postprocedural complications and disorders of digestive system: Secondary | ICD-10-CM

## 2014-11-24 DIAGNOSIS — L8990 Pressure ulcer of unspecified site, unspecified stage: Secondary | ICD-10-CM

## 2014-11-24 DIAGNOSIS — R000 Tachycardia, unspecified: Secondary | ICD-10-CM | POA: Diagnosis not present

## 2014-11-24 DIAGNOSIS — IMO0002 Reserved for concepts with insufficient information to code with codable children: Secondary | ICD-10-CM | POA: Diagnosis present

## 2014-11-24 DIAGNOSIS — K651 Peritoneal abscess: Secondary | ICD-10-CM | POA: Diagnosis not present

## 2014-11-24 DIAGNOSIS — F332 Major depressive disorder, recurrent severe without psychotic features: Secondary | ICD-10-CM | POA: Diagnosis not present

## 2014-11-24 HISTORY — DX: Mixed hyperlipidemia: E78.2

## 2014-11-24 HISTORY — DX: Benign neoplasm of colon, unspecified: D12.6

## 2014-11-24 HISTORY — DX: Impaired fasting glucose: R73.01

## 2014-11-24 HISTORY — DX: Actinic keratosis: L57.0

## 2014-11-24 LAB — CBC WITH DIFFERENTIAL/PLATELET
Basophils Absolute: 0 10*3/uL (ref 0.0–0.1)
Basophils Relative: 0 % (ref 0–1)
Eosinophils Absolute: 0.1 10*3/uL (ref 0.0–0.7)
Eosinophils Relative: 1 % (ref 0–5)
HCT: 26.7 % — ABNORMAL LOW (ref 39.0–52.0)
Hemoglobin: 8.6 g/dL — ABNORMAL LOW (ref 13.0–17.0)
LYMPHS PCT: 4 % — AB (ref 12–46)
Lymphs Abs: 0.3 10*3/uL — ABNORMAL LOW (ref 0.7–4.0)
MCH: 26 pg (ref 26.0–34.0)
MCHC: 32.2 g/dL (ref 30.0–36.0)
MCV: 80.7 fL (ref 78.0–100.0)
MONO ABS: 0.7 10*3/uL (ref 0.1–1.0)
Monocytes Relative: 13 % — ABNORMAL HIGH (ref 3–12)
Neutro Abs: 4.7 10*3/uL (ref 1.7–7.7)
Neutrophils Relative %: 82 % — ABNORMAL HIGH (ref 43–77)
Platelets: 447 10*3/uL — ABNORMAL HIGH (ref 150–400)
RBC: 3.31 MIL/uL — ABNORMAL LOW (ref 4.22–5.81)
RDW: 17.2 % — ABNORMAL HIGH (ref 11.5–15.5)
WBC: 5.8 10*3/uL (ref 4.0–10.5)

## 2014-11-24 LAB — COMPREHENSIVE METABOLIC PANEL
ALT: 21 U/L (ref 17–63)
AST: 32 U/L (ref 15–41)
Albumin: 2.5 g/dL — ABNORMAL LOW (ref 3.5–5.0)
Alkaline Phosphatase: 63 U/L (ref 38–126)
Anion gap: 13 (ref 5–15)
BUN: 12 mg/dL (ref 6–20)
CALCIUM: 8.7 mg/dL — AB (ref 8.9–10.3)
CHLORIDE: 99 mmol/L — AB (ref 101–111)
CO2: 20 mmol/L — AB (ref 22–32)
Creatinine, Ser: 1.29 mg/dL — ABNORMAL HIGH (ref 0.61–1.24)
GFR calc Af Amer: >60 mL/min (ref >60)
GFR, EST NON AFRICAN AMERICAN: 55 mL/min — AB (ref >60)
Glucose, Bld: 96 mg/dL (ref 65–99)
Potassium: 3.2 mmol/L — ABNORMAL LOW (ref 3.5–5.1)
SODIUM: 132 mmol/L — AB (ref 135–145)
Total Bilirubin: 0.3 mg/dL (ref 0.3–1.2)
Total Protein: 5.7 g/dL — ABNORMAL LOW (ref 6.5–8.1)

## 2014-11-24 LAB — LIPASE, BLOOD: Lipase: 47 U/L (ref 22–51)

## 2014-11-24 MED ORDER — SODIUM CHLORIDE 0.9 % IV BOLUS (SEPSIS)
500.0000 mL | Freq: Once | INTRAVENOUS | Status: AC
  Administered 2014-11-24: 500 mL via INTRAVENOUS

## 2014-11-24 MED ORDER — FENTANYL CITRATE (PF) 100 MCG/2ML IJ SOLN
50.0000 ug | Freq: Once | INTRAMUSCULAR | Status: AC
  Administered 2014-11-24: 50 ug via INTRAVENOUS
  Filled 2014-11-24: qty 2

## 2014-11-24 MED ORDER — SODIUM CHLORIDE 0.9 % IV SOLN
INTRAVENOUS | Status: DC
  Administered 2014-11-24: 21:00:00 via INTRAVENOUS

## 2014-11-24 MED ORDER — LIDOCAINE HCL 2 % EX GEL
CUTANEOUS | Status: AC
  Administered 2014-11-25: 1
  Filled 2014-11-24: qty 10

## 2014-11-24 MED ORDER — METRONIDAZOLE IN NACL 5-0.79 MG/ML-% IV SOLN
500.0000 mg | Freq: Three times a day (TID) | INTRAVENOUS | Status: DC
  Administered 2014-11-25 – 2014-12-10 (×46): 500 mg via INTRAVENOUS
  Filled 2014-11-24 (×49): qty 100

## 2014-11-24 MED ORDER — ONDANSETRON HCL 4 MG/2ML IJ SOLN
4.0000 mg | Freq: Once | INTRAMUSCULAR | Status: AC
  Administered 2014-11-24: 4 mg via INTRAVENOUS
  Filled 2014-11-24: qty 2

## 2014-11-24 MED ORDER — LINEZOLID 600 MG/300ML IV SOLN
600.0000 mg | Freq: Two times a day (BID) | INTRAVENOUS | Status: DC
  Administered 2014-11-25 – 2014-12-03 (×16): 600 mg via INTRAVENOUS
  Filled 2014-11-24 (×18): qty 300

## 2014-11-24 NOTE — Telephone Encounter (Signed)
-----   Message from Tyler Pita, MD sent at 11/24/2014 12:19 PM EDT ----- Sam,  Please call patient.  Clavicle looks OK and imaging, which included chest/abdomen/pelvis shows no evidence of recurrence.  Fluid collections are smaller.  Still has evidence of partial small bowel obstruction in region of surgery.  I am copying Dr. Barry Dienes so she is aware.  MM

## 2014-11-24 NOTE — ED Notes (Signed)
Pt complains of bloating, nausea since yesterday. Pt states he vomited 3-4 times today. Pt states he has hx of bowel obstruction.

## 2014-11-24 NOTE — Telephone Encounter (Signed)
Per Dr. Johny Shears order I phoned the patient explaining his clavicle looks ok, there is no evidence of recurrent and fluid collections are smaller. Explained there is still some evidence of a partial small bowel obstruction. Patient states, "I am concerned because my nausea has ramped up again." Strongly advised patient to reach out to Dr. Barry Dienes reference this. Explained that Dr. Tammi Klippel has messaged her making her aware. Patient requested 8/31 appt with Dr. Tammi Klippel remain on the books until Monday or after he speaks with [facility].

## 2014-11-24 NOTE — H&P (Signed)
[facility] Admission History and Physical       Jasiah Elsen PJA:[id number] DOB: 10/01/1945 DOA: 11/24/2014  Referring physician: EDP PCP: Gennette Pac, MD  Specialists:   Chief Complaint: Nausea and Vomiting and ABD Distension  HPI: Antwain Caliendo is a 69 y.o. 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   He denies any hematemesis, fevers or chills.  A Ct scan was performed 1 day ago for cancer staging and revealed a high grade partial small bowl obstruction and General surgery was consulted and an NGT was placed.  He was referred for medical admission.  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      Review of Systems:  Constitutional: No Weight Loss, No Weight Gain, Night Sweats, Fevers, Chills, Dizziness, Light Headedness, Fatigue, or Generalized Weakness HEENT: No Headaches, Difficulty Swallowing,Tooth/Dental Problems,Sore Throat,  No Sneezing, Rhinitis, Ear Ache, Nasal Congestion, or Post Nasal Drip,  Cardio-vascular:  No Chest pain, Orthopnea, PND, Edema in Lower Extremities, Anasarca, Dizziness, Palpitations  Resp: No Dyspnea, No DOE, No Productive Cough, No Non-Productive Cough, No Hemoptysis, No Wheezing.    GI: No Heartburn, Indigestion, +Abdominal Pain, +Nausea, +Vomiting, Diarrhea, Constipation, Hematemesis, Hematochezia, Melena, Change in Bowel Habits,  Loss of Appetite  GU: No Dysuria, No Change in Color of Urine, No Urgency or Urinary Frequency, No Flank pain.  Musculoskeletal: No Joint Pain or Swelling, No Decreased Range of Motion, No Back Pain.  Neurologic: No Syncope, No Seizures, Muscle Weakness, Paresthesia, Vision Disturbance or Loss, No Diplopia, No Vertigo, No Difficulty Walking,   Skin: No Rash or Lesions. Psych: No Change in Mood or Affect, No Depression or Anxiety, No Memory loss, No Confusion, or Hallucinations   Past Medical History  Diagnosis Date  . Hypertension   . Elevated cholesterol   . Anginal pain     hx of CP saw Dr Wynonia Lawman 09/20/2013 H&P per chart   . Left Retroperitoneal mass s/p resection/L nephrectomy 07/13/2014   . Postoperative anemia due to acute blood loss 07/15/2014  . SBO (small bowel obstruction) 08/01/2014, 10/11/14  . Retroperitoneal fluid collection   . 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 Surgery for arm fracture Right 1952  . Knee arthroscopy Right 1970's  . Colonoscopy with propofol N/A 10/20/2013    Procedure: COLONOSCOPY WITH PROPOFOL;  Surgeon: Lear Ng, MD;  Location: [facility]COPY;  Service: Endoscopy;  Laterality: N/A;  . Hot hemostasis N/A 10/20/2013    Procedure: HOT HEMOSTASIS (ARGON PLASMA COAGULATION/BICAP);  Surgeon: Lear Ng, MD;  Location: [facility];  Service: Endoscopy;  Laterality: N/A;  . Tigger finger release      per H&P per Dr Wynonia Lawman 09/20/2013 (on chart)  . Resection of retroperitoneal mass Left 07/13/2014    Procedure: RESECTION OF left RETROPERITONEAL MASS;  Surgeon: Stark Klein, MD;  Location: [facility] ORS;  Service: General;  Laterality: Left;  . Esophagogastroduodenoscopy (egd) with propofol N/A 09/19/2014    Procedure: ESOPHAGOGASTRODUODENOSCOPY (EGD) WITH PROPOFOL;  Surgeon: Wilford Corner, MD;  Location: [facility];  Service: Endoscopy;  Laterality: N/A;      Prior to  Admission medications   Medication Sig Start Date End Date Taking? Authorizing Provider  ferrous sulfate 325 (65 FE) MG tablet TAKE 1 TABLET (325 MG TOTAL) BY MOUTH 2 (TWO) TIMES DAILY WITH A MEAL. 10/20/14  Yes Historical Provider, MD  linezolid (ZYVOX) 600 MG tablet Take 1 tablet (600  mg total) by mouth 2 (two) times daily. 11/15/14  Yes Truman Hayward, MD  LORazepam (ATIVAN) 1 MG tablet Take 1 tablet by mouth 3 (three) times daily as needed for anxiety. Anxiety/nausea 09/01/14  Yes Historical Provider, MD  Melatonin 5 MG CAPS Take 1 capsule by mouth at bedtime.  07/27/14  Yes Historical Provider, MD  metroNIDAZOLE (FLAGYL) 500 MG tablet Take 1 tablet (500 mg total) by mouth 3 (three) times daily. 11/15/14  Yes Truman Hayward, MD  Multiple Vitamin (MULTIVITAMIN WITH MINERALS) TABS tablet Take 1 tablet by mouth every morning.    Yes Historical Provider, MD  ondansetron (ZOFRAN ODT) 8 MG disintegrating tablet Take 1 tablet (8 mg total) by mouth 3 (three) times daily before meals. 11/10/14  Yes Debbe Odea, MD  pantoprazole (PROTONIX) 40 MG tablet Take 1 tablet (40 mg total) by mouth daily. 09/22/14  Yes Donne Hazel, MD  traZODone (DESYREL) 50 MG tablet Take 50 mg by mouth at bedtime.    Yes Historical Provider, MD     Allergies  Allergen Reactions  . Compazine [Prochlorperazine] Other (See Comments)    Made pt really lethargic, and sleepy    Social History:  reports that he has never smoked. He has never used smokeless tobacco. He reports that he does not drink alcohol or use illicit drugs.    Family History  Problem Relation Age of Onset  . Adopted: Yes  . Family history unknown: Yes       Physical Exam:  GEN:  Pleasant Well Nourished and Well Developed  69 y.o. Caucasian male examined and in no acute distress; cooperative with exam Filed Vitals:   11/24/14 2020  BP: 129/71  Pulse: 113  Temp: 97.9 F (36.6 C)  TempSrc: Oral  Resp: 20  Height: 5\' 8"  (1.727 m)  Weight: 73.936 kg (163 lb)  SpO2: 99%   Blood pressure 129/71, pulse 113, temperature 97.9 F (36.6 C), temperature source Oral, resp. rate 20, height 5\' 8"  (1.727 m), weight 73.936 kg (163 lb), SpO2 99 %. PSYCH: He is alert and oriented x4; does not appear anxious does not appear depressed;  affect is normal HEENT: Normocephalic and Atraumatic, Mucous membranes pink; PERRLA; EOM intact; Fundi:  Benign;  No scleral icterus, Nares: Patent, Oropharynx: Clear, Fair Dentition,    Neck:  FROM, No Cervical Lymphadenopathy nor Thyromegaly or Carotid Bruit; No JVD; Breasts:: Not examined CHEST WALL: No tenderness CHEST: Normal respiration, clear to auscultation bilaterally HEART: Regular rate and rhythm; no murmurs rubs or gallops BACK: No kyphosis or scoliosis; No CVA tenderness ABDOMEN: Positive Bowel Sounds, Soft Non-Tender Mildly Distended, No Rebound or Guarding; No Masses, No Organomegaly. Rectal Exam: Not done EXTREMITIES: No Cyanosis, Clubbing, or Edema; No Ulcerations. Genitalia: not examined PULSES: 2+ and symmetric SKIN: Normal hydration no rash or ulceration CNS:  Alert and Oriented x 4, No Focal Deficits Vascular: pulses palpable throughout    Labs on Admission:  Basic Metabolic Panel:  Recent Labs Lab 11/24/14 2130  NA 132*  K 3.2*  CL 99*  CO2 20*  GLUCOSE 96  BUN 12  CREATININE 1.29*  CALCIUM 8.7*   Liver Function Tests:  Recent Labs Lab 11/24/14 2130  AST 32  ALT 21  ALKPHOS 63  BILITOT 0.3  PROT 5.7*  ALBUMIN 2.5*    Recent Labs Lab 11/24/14 2130  LIPASE 47   No results for input(s): AMMONIA in the last 168 hours. CBC:  Recent Labs Lab 11/24/14 2130  WBC 5.8  NEUTROABS 4.7  HGB 8.6*  HCT 26.7*  MCV 80.7  PLT 447*   Cardiac Enzymes: No results for input(s): CKTOTAL, CKMB, CKMBINDEX, TROPONINI in the last 168 hours.  BNP (last 3 results) No results for input(s): BNP in the last 8760 hours.  ProBNP (last 3 results) No results for input(s): PROBNP in the last 8760 hours.  CBG: No results for input(s): GLUCAP in the last 168 hours.  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  CONTRAST:  145mL OMNIPAQUE IOHEXOL 300 MG/ML  SOLN  COMPARISON:  11/07/2014  FINDINGS: CT CHEST FINDINGS  Thoracic inlet: No neck base or axillary masses or adenopathy. Normal thyroid.  Mediastinum and hila: Heart normal in size and configuration. Mild prominence of the ascending aorta measuring 4 cm in diameter. 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 Right PICC tip projects in the lower superior vena cava. No mediastinal or hilar masses or pathologically enlarged lymph nodes.  Lungs and pleura: No lung masses or suspicious nodules. Small left pleural effusion associated with mild dependent left lower lobe atelectasis. Small focus of reticular nodular opacity in the posterior right lower lobe is likely additional atelectasis or scarring. There is no lung consolidation or edema. No pneumothorax.  CT ABDOMEN AND PELVIS FINDINGS  There is a fluid collection that extends from the posterior medial left superior retroperitoneum, adjacent to the spleen, to the lateral margin of the superior left psoas muscle. This measures 4.9 x 2.2 cm transversely, previously 5.6 x 2.6 cm. It measures 7.6 cm an approximate superior inferior length, previously 9.2 cm. Fluid attenuation and multiple blastic and clips lie in the left upper quadrant between the posterior wall of the stomach and upper abdominal aorta. There is thickening of this portion of the stomach wall. These changes partly surround the pancreatic tail and surround the left diaphragmatic crus. No left adrenal gland is visible. Left kidney is surgically absent.  Liver and  spleen:  Unremarkable.  Gallbladder and biliary tree: Unremarkable.  Pancreas: There is some heterogeneous lower attenuation along the pancreatic tail adjacent to the left upper quadrant surgical changes and low-attenuation soft tissue. This is similar to the prior exam, likely reactive edema. No other pancreatic abnormality.  Right adrenal glands: Normal.  Right kidney, ureter, bladder: 15 mm midpole cyst. No other right renal masses, no stones and no hydronephrosis. Normal ureters. Unremarkable bladder.  Lymph nodes:  No pathologically enlarged lymph nodes.  Ascites:  Trace ascites in the pelvis.  Gastrointestinal: There is a partial small bowel obstruction. The transition point is in the left upper adjacent to the postsurgical changes and residual left retroperitoneal fluid collection. Small bowel proximal to the transition is dilated to a  maximum of 5.3 cm. Small bowel distal to this is decompressed. The colon is partly decompressed. There are scattered colonic diverticula mostly along the sigmoid colon. No diverticulitis. Normal-sized appendix is visualized. No bowel wall thickening or inflammatory changes. No free air.  Abdominal wall: There is edema in the subcutaneous fat mostly along the flanks. There is midline abdominal incision. No hernias.  Vascular: Atherosclerotic changes are noted along the infrarenal abdominal aorta and branch vessels. No aneurysm.  MUSCULOSKELETAL FINDINGS  No osteoblastic or osteolytic lesions. Degenerative changes noted of the visualized spine. Bones are demineralized.  IMPRESSION: 1. Residual left retroperitoneal and left upper quadrant collection associated with postsurgical changes. The defined residual collection, along the posterior medial left retroperitoneum abutting the spleen and left psoas muscle, is smaller, now measuring 4.9 x 2.3 x 7.6 cm, previously 5.6 x 2.6 x 9.2 cm. Left upper quadrant postsurgical and inflammatory changes include thickening of the posterior wall  of the stomach, stable. There are numerous vascular clips in the left retroperitoneum and left posterior medial upper quadrant. 2. Fairly high-grade partial small bowel obstruction with the transition point in the posterior medial left upper quadrant adjacent to the residual collection and postsurgical changes. This is similar to prior study. 3. No convincing local residual retroperitoneal sarcoma. 4. No evidence of metastatic disease from the sarcoma.   Electronically Signed   By: Lajean Manes M.D.   On: 11/23/2014 14:36   Ct Abdomen Pelvis W Contrast  11/23/2014   CLINICAL DATA:  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  CONTRAST:  166mL OMNIPAQUE IOHEXOL 300 MG/ML  SOLN  COMPARISON:  11/07/2014  FINDINGS: CT CHEST FINDINGS  Thoracic inlet: No neck base or axillary masses or adenopathy. Normal thyroid.  Mediastinum and hila: Heart normal in size and configuration. Mild prominence of the ascending aorta measuring 4 cm in diameter. 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 Right PICC tip projects in the lower superior vena cava. No mediastinal or hilar masses or pathologically enlarged lymph nodes.  Lungs and pleura: No lung masses or suspicious nodules. Small left pleural effusion associated with mild dependent left lower lobe atelectasis. Small focus of reticular nodular opacity in the posterior right lower lobe is likely additional atelectasis or scarring. There is no lung consolidation or edema. No pneumothorax.  CT ABDOMEN AND PELVIS FINDINGS   There is a fluid collection that extends from the posterior medial left superior retroperitoneum, adjacent to the spleen, to the lateral margin of the superior left psoas muscle. This measures 4.9 x 2.2 cm transversely, previously 5.6 x 2.6 cm. It measures 7.6 cm an approximate superior inferior length, previously 9.2 cm. Fluid attenuation and multiple blastic and clips lie in the left upper quadrant between the posterior wall of the stomach and upper abdominal aorta. There is thickening of this portion of the stomach wall. These changes partly surround the pancreatic tail and surround the left diaphragmatic crus. No left adrenal gland is visible. Left kidney is surgically absent.  Liver and spleen:  Unremarkable.  Gallbladder and biliary tree: Unremarkable.  Pancreas: There is some heterogeneous lower attenuation along the  pancreatic tail adjacent to the left upper quadrant surgical changes and low-attenuation soft tissue. This is similar to the prior exam, likely reactive edema. No other pancreatic abnormality.  Right adrenal glands: Normal.  Right kidney, ureter, bladder: 15 mm midpole cyst. No other right renal masses, no stones and no hydronephrosis. Normal ureters. Unremarkable bladder.  Lymph nodes:  No pathologically enlarged lymph nodes.  Ascites:  Trace ascites in the pelvis.  Gastrointestinal: There is a partial small bowel obstruction. The transition point is in the left upper adjacent to the postsurgical changes and residual left retroperitoneal fluid collection. Small bowel proximal to the transition is dilated to a maximum of 5.3 cm. Small bowel distal to this is decompressed. The colon is partly decompressed. There are scattered colonic diverticula mostly along the sigmoid colon. No diverticulitis. Normal-sized appendix is visualized. No bowel wall thickening or inflammatory changes. No free air.  Abdominal wall: There is edema in the subcutaneous fat mostly along the flanks. There is midline  abdominal incision. No hernias.  Vascular: Atherosclerotic changes are noted along the infrarenal abdominal aorta and branch vessels. No aneurysm.  MUSCULOSKELETAL FINDINGS  No osteoblastic or osteolytic lesions. Degenerative changes noted of the visualized spine. Bones are demineralized.  IMPRESSION: 1. Residual left retroperitoneal and left upper quadrant collection associated with postsurgical changes. The defined residual collection, along the posterior medial left retroperitoneum abutting the spleen and left psoas muscle, is smaller, now measuring 4.9 x 2.3 x 7.6 cm, previously 5.6 x 2.6 x 9.2 cm. Left upper quadrant postsurgical and inflammatory changes include thickening of the posterior wall of the stomach, stable. There are numerous vascular clips in the left retroperitoneum and left posterior medial upper quadrant. 2. Fairly high-grade partial small bowel obstruction with the transition point in the posterior medial left upper quadrant adjacent to the residual collection and postsurgical changes. This is similar to prior study. 3. No convincing local residual retroperitoneal sarcoma. 4. No evidence of metastatic disease from the sarcoma.   Electronically Signed   By: Lajean Manes M.D.   On: 11/23/2014 14:36   Dg Abd Acute W/chest  11/24/2014   CLINICAL DATA:  Bloating and nausea since yesterday. Vomiting today. History of bowel obstructions. History of retroperitoneal liposarcoma.  EXAM: DG ABDOMEN ACUTE W/ 1V CHEST  COMPARISON:  CT abdomen and pelvis 11/23/2014  FINDINGS: Right PICC catheter tip over the low SVC region. No pneumothorax. Linear fibrosis or atelectasis in the left lung base. No focal airspace disease or consolidation in the lungs. Normal heart size and pulmonary vascularity.  Surgical clips in the upper abdomen. Contrast material is demonstrated in the right colon. There are prominent gas-filled distended small bowel loops in the mid abdomen and multiple air-fluid levels are  demonstrated on the upright view. Appearance is consistent with mechanical small bowel obstruction. No free intra-abdominal air. No radiopaque stones. Degenerative changes in the spine.  IMPRESSION: No evidence of active pulmonary disease. Fibrosis or atelectasis in the left lung base.  Gaseous distention of mid abdominal small bowel with multiple air-fluid levels consistent with mechanical small bowel obstruction.   Electronically Signed   By: Lucienne Capers M.D.   On: 11/24/2014 21:51     Assessment/Plan:       69 y.o. male with  Principal Problem:   1.     SBO (small bowel obstruction)   NGTube Placed to LIMS   NPO   IV Protonix   General surgery consulted   IVFs    Active Problems:  2.     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     Abdominal abscess- + VRE   IV Linezolid and Metronidazole while NPO   VRE Precautions    4.     Hypertension   Monitor BPs   IV Hydralazine     5.     Hypokalemia   Replace K+ with IV KCL   Check Magnesium Level     6.     DVT Prophylaxis   SCDs      Code Status:     FULL CODE        Family Communication:   No Family Present    Disposition Plan:    Inpatient Status        Time spent:   [facility] Pager [phone]   If [place] Please Contact the Day Rounding Team MD for [facility]  If 7PM-7AM, Please Contact Night-Floor Coverage  [email or web address] Password [email or web address] 11/24/2014, 11:56 PM     ADDENDUM:   Patient was seen and examined on 11/24/2014

## 2014-11-24 NOTE — ED Provider Notes (Signed)
CSN: [id number]     Arrival date & time 11/24/14  1950 History   First MD Initiated Contact with Patient 11/24/14 2022     Chief Complaint  Patient presents with  . Nausea  . Emesis     (Consider location/radiation/quality/duration/timing/severity/associated sxs/prior Treatment) HPI   Darryl Griffith is a 69 y.o. male  who presents for evaluation of abdominal bloating associated with vomiting, which started yesterday. He was doing well earlier in the week, and ate 3 meals yesterday, and began vomiting in the evening after supper. This morning he is able eat breakfast and ate a little lunch when he began to feel bloated, so stopped eating. Shortly after eating lunch she began vomiting copious amounts of fluid. His bloating has worsened. He has taken Zofran without relief. He denies fever, chills, cough, shortness of breath, chest or back pain. Had a small bowel movement this morning. He has not been constipated recently. He was hospitalized and discharged about 2 weeks ago for similar problems. He saw his surgeon in follow-up on Monday and no additional treatments were recommended. He had a CT of the abdomen done yesterday, ordered by radiation oncology to follow up on his tumor. This test showed partial high-grade small bowel obstruction. He has a chronic intra-abdominal abscess being treated now with oral antibiotics. He has a PICC line in place. 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 There are no other known modifying factors.  Past Medical History  Diagnosis Date  . Hypertension   . Elevated cholesterol   . Anginal pain     hx of CP saw Dr Wynonia Lawman 09/20/2013 H&P per chart   . Left Retroperitoneal mass s/p resection/L nephrectomy 07/13/2014   . Postoperative anemia due to acute blood loss 07/15/2014  . SBO (small bowel obstruction) 08/01/2014, 10/11/14  .  Retroperitoneal fluid collection   . 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 Surgery for arm fracture Right 1952  . Knee arthroscopy Right 1970's  . Colonoscopy with propofol N/A 10/20/2013    Procedure: COLONOSCOPY WITH PROPOFOL;  Surgeon: Lear Ng, MD;  Location: [facility];  Service: Endoscopy;  Laterality: N/A;  . Hot hemostasis N/A 10/20/2013    Procedure: HOT HEMOSTASIS (ARGON PLASMA COAGULATION/BICAP);  Surgeon: Lear Ng, MD;  Location: [facility];  Service: Endoscopy;  Laterality: N/A;  . Tigger finger release      per H&P per Dr Wynonia Lawman 09/20/2013 (on chart)  . Resection of retroperitoneal mass Left 07/13/2014    Procedure: RESECTION OF left RETROPERITONEAL MASS;  Surgeon: Stark Klein, MD;  Location: [facility];  Service: General;  Laterality: Left;  . Esophagogastroduodenoscopy (egd) with propofol N/A 09/19/2014    Procedure: ESOPHAGOGASTRODUODENOSCOPY (EGD) WITH PROPOFOL;  Surgeon: Wilford Corner, MD;  Location: [facility];  Service: Endoscopy;  Laterality: N/A;   Family History  Problem Relation Age of Onset  . Adopted: Yes  . Family history unknown: Yes   Social History  Substance Use Topics  . Smoking status: Never Smoker   . Smokeless tobacco: Never Used  . Alcohol Use: No    Review of Systems  All other systems reviewed and are negative.     Allergies  Compazine  Home Medications   Prior to Admission medications   Medication  Sig Start Date End Date Taking? Authorizing Provider  ferrous sulfate 325 (65 FE) MG tablet TAKE 1 TABLET (325 MG TOTAL) BY MOUTH 2 (TWO) TIMES DAILY WITH A MEAL. 10/20/14  Yes Historical Provider, MD  linezolid (ZYVOX) 600 MG tablet Take 1 tablet (600 mg total) by mouth 2 (two) times daily. 11/15/14  Yes Truman Hayward, MD  LORazepam (ATIVAN) 1 MG tablet Take 1 tablet by mouth 3  (three) times daily as needed for anxiety. Anxiety/nausea 09/01/14  Yes Historical Provider, MD  Melatonin 5 MG CAPS Take 1 capsule by mouth at bedtime.  07/27/14  Yes Historical Provider, MD  metroNIDAZOLE (FLAGYL) 500 MG tablet Take 1 tablet (500 mg total) by mouth 3 (three) times daily. 11/15/14  Yes Truman Hayward, MD  Multiple Vitamin (MULTIVITAMIN WITH MINERALS) TABS tablet Take 1 tablet by mouth every morning.    Yes Historical Provider, MD  ondansetron (ZOFRAN ODT) 8 MG disintegrating tablet Take 1 tablet (8 mg total) by mouth 3 (three) times daily before meals. 11/10/14  Yes Debbe Odea, MD  pantoprazole (PROTONIX) 40 MG tablet Take 1 tablet (40 mg total) by mouth daily. 09/22/14  Yes Donne Hazel, MD  traZODone (DESYREL) 50 MG tablet Take 50 mg by mouth at bedtime.    Yes Historical Provider, MD   BP 129/71 mmHg  Pulse 113  Temp(Src) 97.9 F (36.6 C) (Oral)  Resp 20  Ht 5\' 8"  (1.727 m)  Wt 163 lb (73.936 kg)  BMI 24.79 kg/m2  SpO2 99% Physical Exam  Constitutional: He is oriented to person, place, and time. He appears well-developed and well-nourished.  HENT:  Head: Normocephalic and atraumatic.  Right Ear: External ear normal.  Left Ear: External ear normal.  Eyes: Conjunctivae and EOM are normal. Pupils are equal, round, and reactive to light.  Neck: Normal range of motion and phonation normal. Neck supple.  Cardiovascular: Normal rate, regular rhythm and normal heart sounds.   Pulmonary/Chest: Effort normal and breath sounds normal. He exhibits no bony tenderness.  Abdominal: Soft. He exhibits distension (Moderate). He exhibits no mass. There is tenderness (diffuse, mild). There is no rebound and no guarding.  Decreased bowel sounds all 4 quadrants.  Musculoskeletal: Normal range of motion.  Small draining area, left flank, no associated redness, drainage or tenderness.  Neurological: He is alert and oriented to person, place, and time. No cranial nerve deficit or  sensory deficit. He exhibits normal muscle tone. Coordination normal.  Skin: Skin is warm, dry and intact.  Psychiatric: He has a normal mood and affect. His behavior is normal. Judgment and thought content normal.  Nursing note and vitals reviewed.   ED Course  Procedures (including critical care time)  21:00- no active vomiting at this time, will withhold placement of NG, at this time.  Medications  0.9 %  sodium chloride infusion ( Intravenous New Bag/Given 11/24/14 2119)  ondansetron [facility]) injection 4 mg (4 mg Intravenous Given 11/24/14 2119)  fentaNYL (SUBLIMAZE) injection 50 mcg (50 mcg Intravenous Given 11/24/14 2119)  sodium chloride 0.9 % bolus 500 mL (500 mLs Intravenous New Bag/Given 11/24/14 2118)    Patient Vitals for the past 24 hrs:  BP Temp Temp src Pulse Resp SpO2 Height Weight  11/24/14 2020 129/71 mmHg 97.9 F (36.6 C) Oral 113 20 99 % 5\' 8"  (1.727 m) 163 lb (73.936 kg)   22:40- case and imaging results discussed with on-call surgeon, Dr. Lucia Gaskins, who requested hospitalist admission and he will see  as a Optometrist later tonight or early in the morning.  22:45- . NG tube ordered  10:48 PM Reevaluation with update and discussion. After initial assessment and treatment, an updated evaluation reveals he continues to plan of abdominal bloating. Findings discussed with the patient, he agrees to proceed with NG placement.Daleen Bo L    10:49 PM-Consult complete with hospitalist. Patient case explained and discussed. She agrees to admit patient for further evaluation and treatment. Call ended at 23:40   Labs Review Labs Reviewed  COMPREHENSIVE METABOLIC PANEL - Abnormal; Notable for the following:    Sodium 132 (*)    Potassium 3.2 (*)    Chloride 99 (*)    CO2 20 (*)    Creatinine, Ser 1.29 (*)    Calcium 8.7 (*)    Total Protein 5.7 (*)    Albumin 2.5 (*)    GFR calc non Af Amer 55 (*)    All other components within normal limits  CBC WITH  DIFFERENTIAL/PLATELET - Abnormal; Notable for the following:    RBC 3.31 (*)    Hemoglobin 8.6 (*)    HCT 26.7 (*)    RDW 17.2 (*)    Platelets 447 (*)    Neutrophils Relative % 82 (*)    Lymphocytes Relative 4 (*)    Lymphs Abs 0.3 (*)    Monocytes Relative 13 (*)    All other components within normal limits  LIPASE, BLOOD   Ct Chest W Contrast  11/23/2014   CLINICAL DATA:  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  CONTRAST:  116mL OMNIPAQUE IOHEXOL 300 MG/ML  SOLN  COMPARISON:  11/07/2014  FINDINGS: CT CHEST FINDINGS  Thoracic inlet: No neck base or axillary masses or adenopathy. Normal thyroid.  Mediastinum and hila: Heart normal in size and configuration. Mild prominence of the ascending aorta measuring 4 cm in diameter. 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 Right PICC tip projects in the lower superior vena cava. No mediastinal or hilar masses or pathologically enlarged lymph nodes.  Lungs and pleura: No lung masses or suspicious nodules. Small left pleural effusion associated with mild dependent left lower lobe atelectasis. Small focus of reticular nodular opacity in the posterior right lower lobe is likely additional atelectasis or scarring. There is no lung consolidation or edema. No pneumothorax.  CT ABDOMEN AND PELVIS FINDINGS  There is a fluid collection that extends from the posterior medial left superior retroperitoneum, adjacent to the spleen, to the lateral margin of the superior left psoas muscle. This measures 4.9 x  2.2 cm transversely, previously 5.6 x 2.6 cm. It measures 7.6 cm an approximate superior inferior length, previously 9.2 cm. Fluid attenuation and multiple blastic and clips lie in the left upper quadrant between the posterior wall of the stomach and upper abdominal aorta. There is thickening of this portion of the stomach wall. These changes partly surround the pancreatic tail and surround the left diaphragmatic crus. No left adrenal gland is visible. Left kidney is surgically absent.  Liver and spleen:  Unremarkable.  Gallbladder and biliary tree: Unremarkable.  Pancreas: There is some heterogeneous lower  attenuation along the pancreatic tail adjacent to the left upper quadrant surgical changes and low-attenuation soft tissue. This is similar to the prior exam, likely reactive edema. No other pancreatic abnormality.  Right adrenal glands: Normal.  Right kidney, ureter, bladder: 15 mm midpole cyst. No other right renal masses, no stones and no hydronephrosis. Normal ureters. Unremarkable bladder.  Lymph nodes:  No pathologically enlarged lymph nodes.  Ascites:  Trace ascites in the pelvis.  Gastrointestinal: There is a partial small bowel obstruction. The transition point is in the left upper adjacent to the postsurgical changes and residual left retroperitoneal fluid collection. Small bowel proximal to the transition is dilated to a maximum of 5.3 cm. Small bowel distal to this is decompressed. The colon is partly decompressed. There are scattered colonic diverticula mostly along the sigmoid colon. No diverticulitis. Normal-sized appendix is visualized. No bowel wall thickening or inflammatory changes. No free air.  Abdominal wall: There is edema in the subcutaneous fat mostly along the flanks. There is midline abdominal incision. No hernias.  Vascular: Atherosclerotic changes are noted along the infrarenal abdominal aorta and branch vessels. No aneurysm.  MUSCULOSKELETAL FINDINGS  No osteoblastic or osteolytic  lesions. Degenerative changes noted of the visualized spine. Bones are demineralized.  IMPRESSION: 1. Residual left retroperitoneal and left upper quadrant collection associated with postsurgical changes. The defined residual collection, along the posterior medial left retroperitoneum abutting the spleen and left psoas muscle, is smaller, now measuring 4.9 x 2.3 x 7.6 cm, previously 5.6 x 2.6 x 9.2 cm. Left upper quadrant postsurgical and inflammatory changes include thickening of the posterior wall of the stomach, stable. There are numerous vascular clips in the left retroperitoneum and left posterior medial upper quadrant. 2. Fairly high-grade partial small bowel obstruction with the transition point in the posterior medial left upper quadrant adjacent to the residual collection and postsurgical changes. This is similar to prior study. 3. No convincing local residual retroperitoneal sarcoma. 4. No evidence of metastatic disease from the sarcoma.   Electronically Signed   By: Lajean Manes M.D.   On: 11/23/2014 14:36   Ct Abdomen Pelvis W Contrast  11/23/2014   CLINICAL DATA:  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  CONTRAST:  137mL OMNIPAQUE IOHEXOL 300 MG/ML  SOLN  COMPARISON:  11/07/2014  FINDINGS: CT CHEST FINDINGS  Thoracic inlet: No neck base or axillary masses or adenopathy. Normal thyroid.  Mediastinum and hila: Heart normal in size and configuration. Mild prominence of the ascending aorta measuring 4 cm in diameter. 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 Right PICC tip projects in the lower superior vena cava. No mediastinal or hilar masses or pathologically enlarged lymph nodes.  Lungs and pleura: No lung masses or suspicious nodules. Small left pleural effusion associated with mild dependent left lower lobe atelectasis. Small focus of reticular nodular opacity in the posterior right lower lobe is likely additional atelectasis or scarring. There is no lung consolidation or edema. No pneumothorax.  CT ABDOMEN AND PELVIS FINDINGS  There is a fluid collection that extends from the posterior medial left superior retroperitoneum, adjacent to the spleen,  to the lateral margin of the superior left psoas muscle. This measures 4.9 x 2.2 cm transversely, previously 5.6 x 2.6 cm. It measures 7.6 cm an approximate superior inferior length, previously 9.2 cm. Fluid attenuation and multiple blastic and clips lie in the left upper quadrant between the posterior wall of the stomach and upper abdominal aorta. There is thickening of this portion of the stomach wall. These changes partly surround the pancreatic tail and surround the left diaphragmatic crus. No left adrenal gland is visible. Left kidney is surgically absent.  Liver and spleen:  Unremarkable.  Gallbladder and biliary tree: Unremarkable.  Pancreas: There is some heterogeneous lower attenuation along the pancreatic tail adjacent to the left upper quadrant surgical changes and low-attenuation soft tissue. This is similar to the prior exam, likely reactive edema. No other pancreatic abnormality.  Right adrenal glands: Normal.  Right kidney, ureter, bladder: 15 mm midpole cyst. No other right renal masses, no stones and no hydronephrosis. Normal ureters. Unremarkable bladder.  Lymph nodes:  No pathologically enlarged lymph nodes.  Ascites:  Trace ascites in the pelvis.  Gastrointestinal: There is a partial small bowel obstruction. The transition point is in the left upper adjacent to the postsurgical  changes and residual left retroperitoneal fluid collection. Small bowel proximal to the transition is dilated to a maximum of 5.3 cm. Small bowel distal to this is decompressed. The colon is partly decompressed. There are scattered colonic diverticula mostly along the sigmoid colon. No diverticulitis. Normal-sized appendix is visualized. No bowel wall thickening or inflammatory changes. No free air.  Abdominal wall: There is edema in the subcutaneous fat mostly along the flanks. There is midline abdominal incision. No hernias.  Vascular: Atherosclerotic changes are noted along the infrarenal abdominal aorta and branch vessels. No aneurysm.  MUSCULOSKELETAL FINDINGS  No osteoblastic or osteolytic lesions. Degenerative changes noted of the visualized spine. Bones are demineralized.  IMPRESSION: 1. Residual left retroperitoneal and left upper quadrant collection associated with postsurgical changes. The defined residual collection, along the posterior medial left retroperitoneum abutting the spleen and left psoas muscle, is smaller, now measuring 4.9 x 2.3 x 7.6 cm, previously 5.6 x 2.6 x 9.2 cm. Left upper quadrant postsurgical and inflammatory changes include thickening of the posterior wall of the stomach, stable. There are numerous vascular clips in the left retroperitoneum and left posterior medial upper quadrant. 2. Fairly high-grade partial small bowel obstruction with the transition point in the posterior medial left upper quadrant adjacent to the residual collection and postsurgical changes. This is similar to prior study. 3. No convincing local residual retroperitoneal sarcoma. 4. No evidence of metastatic disease from the sarcoma.   Electronically Signed   By: Lajean Manes M.D.   On: 11/23/2014 14:36   Dg Abd Acute W/chest  11/24/2014   CLINICAL DATA:  Bloating and nausea since yesterday. Vomiting today. History of bowel obstructions. History of retroperitoneal liposarcoma.  EXAM: DG ABDOMEN ACUTE W/ 1V  CHEST  COMPARISON:  CT abdomen and pelvis 11/23/2014  FINDINGS: Right PICC catheter tip over the low SVC region. No pneumothorax. Linear fibrosis or atelectasis in the left lung base. No focal airspace disease or consolidation in the lungs. Normal heart size and pulmonary vascularity.  Surgical clips in the upper abdomen. Contrast material is demonstrated in the right colon. There are prominent gas-filled distended small bowel loops in the mid abdomen and multiple air-fluid levels are demonstrated on the upright view. Appearance is consistent with mechanical small bowel obstruction. No free intra-abdominal air.  No radiopaque stones. Degenerative changes in the spine.  IMPRESSION: No evidence of active pulmonary disease. Fibrosis or atelectasis in the left lung base.  Gaseous distention of mid abdominal small bowel with multiple air-fluid levels consistent with mechanical small bowel obstruction.   Electronically Signed   By: Lucienne Capers M.D.   On: 11/24/2014 21:51    Imaging Review Ct Chest W Contrast  11/23/2014   CLINICAL DATA:  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  CONTRAST:  159mL OMNIPAQUE IOHEXOL 300 MG/ML  SOLN  COMPARISON:  11/07/2014  FINDINGS: CT CHEST FINDINGS  Thoracic inlet: No neck base or axillary masses or adenopathy. Normal thyroid.  Mediastinum and hila: Heart normal in size and configuration. Mild prominence of the ascending aorta measuring 4 cm in diameter. 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 Right PICC tip  projects in the lower superior vena cava. No mediastinal or hilar masses or pathologically enlarged lymph nodes.  Lungs and pleura: No lung masses or suspicious nodules. Small left pleural effusion associated with mild dependent left lower lobe atelectasis. Small focus of reticular nodular opacity in the posterior right lower lobe is likely additional atelectasis or scarring. There is no lung consolidation or edema. No pneumothorax.  CT ABDOMEN AND PELVIS FINDINGS  There is a fluid collection that extends from the posterior medial left superior retroperitoneum, adjacent to the spleen, to the lateral margin of the superior left psoas muscle. This measures 4.9 x 2.2 cm transversely, previously 5.6 x 2.6 cm. It measures 7.6 cm an approximate superior inferior length, previously 9.2 cm. Fluid attenuation and multiple blastic and clips lie in the left upper quadrant between the posterior wall of the stomach and upper abdominal aorta. There is thickening of this portion of the stomach wall. These changes partly surround the pancreatic tail and surround the left diaphragmatic crus. No left adrenal gland is visible. Left kidney is surgically absent.  Liver and spleen:  Unremarkable.  Gallbladder and biliary tree: Unremarkable.  Pancreas: There is some heterogeneous lower attenuation along the pancreatic tail adjacent to the left upper quadrant surgical changes and low-attenuation soft tissue. This is similar to the prior exam, likely reactive edema. No other pancreatic abnormality.  Right adrenal glands: Normal.  Right kidney, ureter, bladder: 15 mm midpole cyst. No other right renal masses, no stones and no hydronephrosis. Normal ureters. Unremarkable bladder.  Lymph nodes:  No pathologically enlarged lymph nodes.  Ascites:  Trace ascites in the pelvis.  Gastrointestinal: There is a partial small bowel obstruction. The transition point is in the left upper adjacent to the postsurgical changes and residual left  retroperitoneal fluid collection. Small bowel proximal to the transition is dilated to a maximum of 5.3 cm. Small bowel distal to this is decompressed. The colon is partly decompressed. There are scattered colonic diverticula mostly along the sigmoid colon. No diverticulitis. Normal-sized appendix is visualized. No bowel wall thickening or inflammatory changes. No free air.  Abdominal wall: There is edema in the subcutaneous fat mostly along the flanks. There is midline abdominal incision. No hernias.  Vascular: Atherosclerotic changes are noted along the infrarenal  abdominal aorta and branch vessels. No aneurysm.  MUSCULOSKELETAL FINDINGS  No osteoblastic or osteolytic lesions. Degenerative changes noted of the visualized spine. Bones are demineralized.  IMPRESSION: 1. Residual left retroperitoneal and left upper quadrant collection associated with postsurgical changes. The defined residual collection, along the posterior medial left retroperitoneum abutting the spleen and left psoas muscle, is smaller, now measuring 4.9 x 2.3 x 7.6 cm, previously 5.6 x 2.6 x 9.2 cm. Left upper quadrant postsurgical and inflammatory changes include thickening of the posterior wall of the stomach, stable. There are numerous vascular clips in the left retroperitoneum and left posterior medial upper quadrant. 2. Fairly high-grade partial small bowel obstruction with the transition point in the posterior medial left upper quadrant adjacent to the residual collection and postsurgical changes. This is similar to prior study. 3. No convincing local residual retroperitoneal sarcoma. 4. No evidence of metastatic disease from the sarcoma.   Electronically Signed   By: Lajean Manes M.D.   On: 11/23/2014 14:36   Ct Abdomen Pelvis W Contrast  11/23/2014   CLINICAL DATA:  Malignant retroperitoneal tumor C48.0 (ICD-10-CM)  Retroperitoneal sarcoma dx;d 1/16 with left nephrectomy & XRT completed 7/16, left pleural effusion, Prominent lt  proximal clavicular area x 1 week  EXAM: CT CHEST, ABDOMEN, AND PELVIS WITH CONTRAST  TECHNIQUE: Multidetector CT imaging of the chest, abdomen and pelvis was performed following the standard protocol during bolus administration of intravenous contrast.  CONTRAST:  18mL OMNIPAQUE IOHEXOL 300 MG/ML  SOLN  COMPARISON:  11/07/2014  FINDINGS: CT CHEST FINDINGS  Thoracic inlet: No neck base or axillary masses or adenopathy. Normal thyroid.  Mediastinum and hila: Heart normal in size and configuration. Mild prominence of the ascending aorta measuring 4 cm in diameter. 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 Right PICC tip projects in the lower superior vena cava. No mediastinal or hilar masses or pathologically enlarged lymph nodes.  Lungs and pleura: No lung masses or suspicious nodules. Small left pleural effusion associated with mild dependent left lower lobe atelectasis. Small focus of reticular nodular opacity in the posterior right lower lobe is likely additional atelectasis or scarring. There is no lung consolidation or edema. No pneumothorax.  CT ABDOMEN AND PELVIS FINDINGS  There is a fluid collection that extends from the posterior medial left superior retroperitoneum, adjacent to the spleen, to the lateral margin of the superior left psoas muscle. This measures 4.9 x 2.2 cm transversely, previously 5.6 x 2.6 cm. It measures 7.6 cm an approximate superior inferior length, previously 9.2 cm. Fluid attenuation and multiple blastic and clips lie in the left upper quadrant between the posterior wall of the stomach and upper abdominal aorta. There is thickening of this portion of the stomach wall. These changes partly surround the pancreatic tail and surround the left diaphragmatic crus. No left adrenal gland is visible. Left kidney is surgically absent.  Liver and spleen:  Unremarkable.  Gallbladder and biliary tree:  Unremarkable.  Pancreas: There is some heterogeneous lower attenuation along the pancreatic tail adjacent to the left upper quadrant surgical changes and low-attenuation soft tissue. This is similar to the prior exam, likely reactive edema. No other pancreatic abnormality.  Right adrenal glands: Normal.  Right kidney, ureter, bladder: 15 mm midpole cyst. No other right renal masses, no stones and no hydronephrosis. Normal ureters. Unremarkable bladder.  Lymph nodes:  No pathologically enlarged lymph nodes.  Ascites:  Trace ascites in the  pelvis.  Gastrointestinal: There is a partial small bowel obstruction. The transition point is in the left upper adjacent to the postsurgical changes and residual left retroperitoneal fluid collection. Small bowel proximal to the transition is dilated to a maximum of 5.3 cm. Small bowel distal to this is decompressed. The colon is partly decompressed. There are scattered colonic diverticula mostly along the sigmoid colon. No diverticulitis. Normal-sized appendix is visualized. No bowel wall thickening or inflammatory changes. No free air.  Abdominal wall: There is edema in the subcutaneous fat mostly along the flanks. There is midline abdominal incision. No hernias.  Vascular: Atherosclerotic changes are noted along the infrarenal abdominal aorta and branch vessels. No aneurysm.  MUSCULOSKELETAL FINDINGS  No osteoblastic or osteolytic lesions. Degenerative changes noted of the visualized spine. Bones are demineralized.  IMPRESSION: 1. Residual left retroperitoneal and left upper quadrant collection associated with postsurgical changes. The defined residual collection, along the posterior medial left retroperitoneum abutting the spleen and left psoas muscle, is smaller, now measuring 4.9 x 2.3 x 7.6 cm, previously 5.6 x 2.6 x 9.2 cm. Left upper quadrant postsurgical and inflammatory changes include thickening of the posterior wall of the stomach, stable. There are numerous vascular  clips in the left retroperitoneum and left posterior medial upper quadrant. 2. Fairly high-grade partial small bowel obstruction with the transition point in the posterior medial left upper quadrant adjacent to the residual collection and postsurgical changes. This is similar to prior study. 3. No convincing local residual retroperitoneal sarcoma. 4. No evidence of metastatic disease from the sarcoma.   Electronically Signed   By: Lajean Manes M.D.   On: 11/23/2014 14:36   I have personally reviewed and evaluated these images and lab results as part of my medical decision-making.   EKG Interpretation None      MDM   Final diagnoses:  Partial small bowel obstruction    Recurrent small bowel obstruction, symptomatic with bloating and vomiting. This is likely secondary to an inflammatory mass in his abdomen. He will require admission for parenteral therapy, and by mouth status and MG drainage from gastric region. No indication for urgent surgery at this time.  Nursing Notes Reviewed/ Care Coordinated, and agree without changes. Applicable Imaging Reviewed.  Interpretation of Laboratory Data incorporated into ED treatment  Plan: Admit    Daleen Bo, MD 11/24/14 [phone]

## 2014-11-25 ENCOUNTER — Encounter (HOSPITAL_COMMUNITY): Payer: Self-pay | Admitting: *Deleted

## 2014-11-25 DIAGNOSIS — E876 Hypokalemia: Secondary | ICD-10-CM | POA: Diagnosis present

## 2014-11-25 DIAGNOSIS — M4800 Spinal stenosis, site unspecified: Secondary | ICD-10-CM | POA: Insufficient documentation

## 2014-11-25 DIAGNOSIS — E782 Mixed hyperlipidemia: Secondary | ICD-10-CM

## 2014-11-25 DIAGNOSIS — R7301 Impaired fasting glucose: Secondary | ICD-10-CM

## 2014-11-25 DIAGNOSIS — D126 Benign neoplasm of colon, unspecified: Secondary | ICD-10-CM

## 2014-11-25 DIAGNOSIS — L570 Actinic keratosis: Secondary | ICD-10-CM | POA: Insufficient documentation

## 2014-11-25 DIAGNOSIS — I10 Essential (primary) hypertension: Secondary | ICD-10-CM | POA: Diagnosis present

## 2014-11-25 HISTORY — DX: Impaired fasting glucose: R73.01

## 2014-11-25 HISTORY — DX: Mixed hyperlipidemia: E78.2

## 2014-11-25 HISTORY — DX: Actinic keratosis: L57.0

## 2014-11-25 HISTORY — DX: Benign neoplasm of colon, unspecified: D12.6

## 2014-11-25 LAB — BASIC METABOLIC PANEL
Anion gap: 7 (ref 5–15)
BUN: 12 mg/dL (ref 6–20)
CALCIUM: 8.4 mg/dL — AB (ref 8.9–10.3)
CHLORIDE: 102 mmol/L (ref 101–111)
CO2: 23 mmol/L (ref 22–32)
CREATININE: 1.14 mg/dL (ref 0.61–1.24)
GFR calc Af Amer: >60 mL/min (ref >60)
GFR calc non Af Amer: >60 mL/min (ref >60)
Glucose, Bld: 90 mg/dL (ref 65–99)
Potassium: 3.4 mmol/L — ABNORMAL LOW (ref 3.5–5.1)
Sodium: 132 mmol/L — ABNORMAL LOW (ref 135–145)

## 2014-11-25 LAB — CBC
HCT: 26.5 % — ABNORMAL LOW (ref 39.0–52.0)
Hemoglobin: 9 g/dL — ABNORMAL LOW (ref 13.0–17.0)
MCH: 27.2 pg (ref 26.0–34.0)
MCHC: 34 g/dL (ref 30.0–36.0)
MCV: 80.1 fL (ref 78.0–100.0)
PLATELETS: 453 10*3/uL — AB (ref 150–400)
RBC: 3.31 MIL/uL — ABNORMAL LOW (ref 4.22–5.81)
RDW: 17.3 % — AB (ref 11.5–15.5)
WBC: 5.3 10*3/uL (ref 4.0–10.5)

## 2014-11-25 LAB — MAGNESIUM: Magnesium: 1.5 mg/dL — ABNORMAL LOW (ref 1.7–2.4)

## 2014-11-25 MED ORDER — MAGIC MOUTHWASH
15.0000 mL | Freq: Four times a day (QID) | ORAL | Status: DC | PRN
  Administered 2014-12-15: 15 mL via ORAL
  Filled 2014-11-25 (×2): qty 15

## 2014-11-25 MED ORDER — ALUM & MAG HYDROXIDE-SIMETH 200-200-20 MG/5ML PO SUSP: 30.0000 mL | Freq: Four times a day (QID) | ORAL | Status: DC | PRN

## 2014-11-25 MED ORDER — DIPHENHYDRAMINE HCL 50 MG/ML IJ SOLN
12.5000 mg | Freq: Four times a day (QID) | INTRAMUSCULAR | Status: DC | PRN
  Administered 2014-11-27: 25 mg via INTRAVENOUS
  Filled 2014-11-25: qty 1

## 2014-11-25 MED ORDER — LIP MEDEX EX OINT
1.0000 "application " | TOPICAL_OINTMENT | Freq: Two times a day (BID) | CUTANEOUS | Status: DC
  Administered 2014-11-25 – 2014-12-15 (×36): 1 via TOPICAL
  Filled 2014-11-25 (×4): qty 7

## 2014-11-25 MED ORDER — HYDROMORPHONE HCL 1 MG/ML IJ SOLN: 0.5000 mg | INTRAMUSCULAR | Status: DC | PRN

## 2014-11-25 MED ORDER — LACTATED RINGERS IV BOLUS (SEPSIS): 1000.0000 mL | Freq: Three times a day (TID) | INTRAVENOUS | Status: AC | PRN

## 2014-11-25 MED ORDER — LACTATED RINGERS IV BOLUS (SEPSIS)
1000.0000 mL | Freq: Once | INTRAVENOUS | Status: AC
Start: 2014-11-25 — End: 2014-11-25
  Administered 2014-11-25: 1000 mL via INTRAVENOUS

## 2014-11-25 MED ORDER — SODIUM CHLORIDE 0.9 % IV SOLN
INTRAVENOUS | Status: AC
  Administered 2014-11-25: 01:00:00 via INTRAVENOUS

## 2014-11-25 MED ORDER — BISACODYL 10 MG RE SUPP
10.0000 mg | Freq: Every day | RECTAL | Status: DC
  Administered 2014-11-25 – 2014-12-15 (×11): 10 mg via RECTAL
  Filled 2014-11-25 (×14): qty 1

## 2014-11-25 MED ORDER — LORAZEPAM 2 MG/ML IJ SOLN
0.5000 mg | Freq: Once | INTRAMUSCULAR | Status: AC
  Administered 2014-11-25: 0.5 mg via INTRAVENOUS
  Filled 2014-11-25: qty 1

## 2014-11-25 MED ORDER — MAGNESIUM SULFATE 2 GM/50ML IV SOLN
2.0000 g | Freq: Once | INTRAVENOUS | Status: AC
  Administered 2014-11-25: 2 g via INTRAVENOUS
  Filled 2014-11-25: qty 50

## 2014-11-25 MED ORDER — METOPROLOL TARTRATE 1 MG/ML IV SOLN
5.0000 mg | Freq: Four times a day (QID) | INTRAVENOUS | Status: DC | PRN
  Administered 2014-12-01 – 2014-12-07 (×5): 5 mg via INTRAVENOUS
  Filled 2014-11-25 (×5): qty 5

## 2014-11-25 MED ORDER — LORAZEPAM 2 MG/ML IJ SOLN
0.5000 mg | Freq: Once | INTRAMUSCULAR | Status: AC
Start: 2014-11-25 — End: 2014-11-25
  Administered 2014-11-25: 0.5 mg via INTRAVENOUS
  Filled 2014-11-25: qty 1

## 2014-11-25 MED ORDER — ONDANSETRON HCL 4 MG PO TABS: 4.0000 mg | ORAL_TABLET | Freq: Four times a day (QID) | ORAL | Status: DC | PRN

## 2014-11-25 MED ORDER — ACETAMINOPHEN 325 MG PO TABS
650.0000 mg | ORAL_TABLET | Freq: Four times a day (QID) | ORAL | Status: DC | PRN
  Filled 2014-11-25: qty 2

## 2014-11-25 MED ORDER — PROMETHAZINE HCL 25 MG/ML IJ SOLN
6.2500 mg | INTRAMUSCULAR | Status: DC | PRN
  Administered 2014-12-14 (×2): 12.5 mg via INTRAVENOUS
  Filled 2014-11-25 (×2): qty 1

## 2014-11-25 MED ORDER — SODIUM CHLORIDE 0.9 % IJ SOLN
10.0000 mL | INTRAMUSCULAR | Status: DC | PRN
  Administered 2014-11-25 – 2014-12-16 (×10): 10 mL
  Filled 2014-11-25 (×9): qty 40

## 2014-11-25 MED ORDER — LORAZEPAM 0.5 MG PO TABS: 0.5000 mg | ORAL_TABLET | Freq: Once | ORAL | Status: DC

## 2014-11-25 MED ORDER — SODIUM CHLORIDE 0.9 % IV SOLN
INTRAVENOUS | Status: DC
  Administered 2014-11-25: 17:00:00 via INTRAVENOUS

## 2014-11-25 MED ORDER — MENTHOL 3 MG MT LOZG
1.0000 | LOZENGE | OROMUCOSAL | Status: DC | PRN
  Filled 2014-11-25: qty 9

## 2014-11-25 MED ORDER — PHENOL 1.4 % MT LIQD
1.0000 | OROMUCOSAL | Status: DC | PRN
  Administered 2014-11-25 (×2): 1 via OROMUCOSAL
  Filled 2014-11-25: qty 177

## 2014-11-25 MED ORDER — POTASSIUM CHLORIDE 10 MEQ/100ML IV SOLN
10.0000 meq | INTRAVENOUS | Status: AC
  Administered 2014-11-25 (×3): 10 meq via INTRAVENOUS
  Filled 2014-11-25 (×3): qty 100

## 2014-11-25 MED ORDER — ONDANSETRON HCL 4 MG/2ML IJ SOLN
4.0000 mg | Freq: Four times a day (QID) | INTRAMUSCULAR | Status: DC | PRN
  Administered 2014-11-25 – 2014-11-29 (×4): 4 mg via INTRAVENOUS
  Filled 2014-11-25 (×5): qty 2

## 2014-11-25 MED ORDER — ACETAMINOPHEN 650 MG RE SUPP: 650.0000 mg | Freq: Four times a day (QID) | RECTAL | Status: DC | PRN

## 2014-11-25 NOTE — Progress Notes (Addendum)
PROGRESS NOTE  Darryl Griffith XTK:[id number] DOB: [birth year] DOA: 11/24/2014 PCP: Gennette Pac, MD   HPI: Darryl Griffith is a 69 y.o. 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 General surgery was consulted on admission.   Subjective / 24 H Interval events - no chest pain, shortness of breath, no abdominal pain or vomiting. He is feeling well. 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 - s/p radiation tx - outpatient management  Left retroperitoneal and left upper quadrant fluid collection  - collection appears smaller however not dramatically decreased - last time this was sampled on 8/12 was positive still for VRE - will sample again as if still positive will change treatment length and may need to be drained completely  HTN - prn hydralazine  Hypokalemia - replete IV  Hypomagnesemia - in the setting of GI losses, replete  Diet: Diet NPO time specified Fluids: NS DVT Prophylaxis: SCD  Code Status: Full Code Family Communication: no family bedside  Disposition Plan: remain inpatient  Consultants:  General surgery   Procedures:  None    Antibiotics  Anti-infectives    Start     Dose/Rate Route Frequency Ordered Stop   11/25/14  0000  metroNIDAZOLE (FLAGYL) IVPB 500 mg     500 mg 100 mL/hr over 60 Minutes Intravenous Every 8 hours 11/24/14 2355     11/25/14 0000  linezolid (ZYVOX) IVPB 600 mg     600 mg 300 mL/hr over 60 Minutes Intravenous Every 12 hours 11/24/14 2355         Studies  Ct Chest W Contrast  11/23/2014   CLINICAL DATA:  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  CONTRAST:  181mL OMNIPAQUE IOHEXOL 300 MG/ML  SOLN  COMPARISON:  11/07/2014  FINDINGS: CT CHEST FINDINGS  Thoracic inlet: No neck base or axillary masses or adenopathy. Normal thyroid.  Mediastinum and hila: Heart normal in size and configuration. Mild prominence of the ascending aorta measuring 4 cm in diameter. 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 Right PICC tip projects in the lower superior vena cava. No mediastinal or hilar masses or pathologically enlarged lymph nodes.  Lungs and pleura: No lung masses or suspicious nodules. Small left pleural effusion associated with mild dependent left lower lobe atelectasis. Small focus of reticular nodular opacity in the posterior right lower lobe is likely additional atelectasis or scarring. There is no lung consolidation or edema. No pneumothorax.  CT  ABDOMEN AND PELVIS FINDINGS  There is a fluid collection that extends from the posterior medial left superior retroperitoneum, adjacent to the spleen, to the lateral margin of the superior left psoas muscle. This measures 4.9 x 2.2 cm transversely, previously 5.6 x 2.6 cm. It measures 7.6 cm an  approximate superior inferior length, previously 9.2 cm. Fluid attenuation and multiple blastic and clips lie in the left upper quadrant between the posterior wall of the stomach and upper abdominal aorta. There is thickening of this portion of the stomach wall. These changes partly surround the pancreatic tail and surround the left diaphragmatic crus. No left adrenal gland is visible. Left kidney is surgically absent.  Liver and spleen:  Unremarkable.  Gallbladder and biliary tree: Unremarkable.  Pancreas: There is some heterogeneous lower attenuation along the pancreatic tail adjacent to the left upper quadrant surgical changes and low-attenuation soft tissue. This is similar to the prior exam, likely reactive edema. No other pancreatic abnormality.  Right adrenal glands: Normal.  Right kidney, ureter, bladder: 15 mm midpole cyst. No other right renal masses, no stones and no hydronephrosis. Normal ureters. Unremarkable bladder.  Lymph nodes:  No pathologically enlarged lymph nodes.  Ascites:  Trace ascites in the pelvis.  Gastrointestinal: There is a partial small bowel obstruction. The transition point is in the left upper adjacent to the postsurgical changes and residual left retroperitoneal fluid collection. Small bowel proximal to the transition is dilated to a maximum of 5.3 cm. Small bowel distal to this is decompressed. The colon is partly decompressed. There are scattered colonic diverticula mostly along the sigmoid colon. No diverticulitis. Normal-sized appendix is visualized. No bowel wall thickening or inflammatory changes. No free air.  Abdominal wall: There is edema in the subcutaneous fat mostly along the flanks. There is midline abdominal incision. No hernias.  Vascular: Atherosclerotic changes are noted along the infrarenal abdominal aorta and branch vessels. No aneurysm.  MUSCULOSKELETAL FINDINGS  No osteoblastic or osteolytic lesions. Degenerative changes noted of the visualized spine. Bones are  demineralized.  IMPRESSION: 1. Residual left retroperitoneal and left upper quadrant collection associated with postsurgical changes. The defined residual collection, along the posterior medial left retroperitoneum abutting the spleen and left psoas muscle, is smaller, now measuring 4.9 x 2.3 x 7.6 cm, previously 5.6 x 2.6 x 9.2 cm. Left upper quadrant postsurgical and inflammatory changes include thickening of the posterior wall of the stomach, stable. There are numerous vascular clips in the left retroperitoneum and left posterior medial upper quadrant. 2. Fairly high-grade partial small bowel obstruction with the transition point in the posterior medial left upper quadrant adjacent to the residual collection and postsurgical changes. This is similar to prior study. 3. No convincing local residual retroperitoneal sarcoma. 4. No evidence of metastatic disease from the sarcoma.   Electronically Signed   By: Lajean Manes M.D.   On: 11/23/2014 14:36   Ct Abdomen Pelvis W Contrast  11/23/2014   CLINICAL DATA:  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  CONTRAST:  152mL OMNIPAQUE IOHEXOL 300 MG/ML  SOLN  COMPARISON:  11/07/2014  FINDINGS: CT CHEST FINDINGS  Thoracic inlet: No neck base or axillary masses or adenopathy. Normal thyroid.  Mediastinum and hila: Heart normal in size and configuration. Mild prominence of the ascending aorta measuring 4 cm in diameter. 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 Right PICC tip projects in the lower superior vena  cava. No mediastinal or hilar masses or pathologically enlarged lymph nodes.  Lungs and pleura: No lung masses or suspicious nodules. Small left pleural effusion associated with mild dependent left lower lobe atelectasis. Small focus of reticular nodular opacity in the posterior right lower lobe is likely additional atelectasis or scarring. There is no lung consolidation or edema. No pneumothorax.  CT ABDOMEN AND PELVIS FINDINGS  There is a fluid collection that extends from the posterior medial left superior retroperitoneum, adjacent to the spleen, to the lateral margin of the superior left psoas muscle. This measures 4.9 x 2.2 cm transversely, previously 5.6 x 2.6 cm. It measures 7.6 cm an approximate superior inferior length, previously 9.2 cm. Fluid attenuation and multiple blastic and clips lie in the left upper quadrant between the posterior wall of the stomach and upper abdominal aorta. There is thickening of this portion of the stomach wall. These changes partly surround the pancreatic tail and surround the left diaphragmatic crus. No left adrenal gland is visible. Left kidney is surgically absent.  Liver and spleen:  Unremarkable.  Gallbladder and biliary tree: Unremarkable.  Pancreas: There is some heterogeneous lower attenuation along the pancreatic tail adjacent to the left upper quadrant surgical changes and low-attenuation soft tissue. This is similar to the prior exam, likely reactive edema. No other pancreatic abnormality.  Right adrenal glands: Normal.  Right kidney, ureter, bladder: 15 mm midpole cyst. No other right renal masses, no stones and no hydronephrosis. Normal ureters. Unremarkable bladder.  Lymph nodes:  No pathologically enlarged lymph nodes.  Ascites:  Trace ascites in the pelvis.  Gastrointestinal: There is a partial small bowel obstruction. The transition point is in the left upper adjacent to the postsurgical changes and residual left retroperitoneal fluid collection. Small bowel  proximal to the transition is dilated to a maximum of 5.3 cm. Small bowel distal to this is decompressed. The colon is partly decompressed. There are scattered colonic diverticula mostly along the sigmoid colon. No diverticulitis. Normal-sized appendix is visualized. No bowel wall thickening or inflammatory changes. No free air.  Abdominal wall: There is edema in the subcutaneous fat mostly along the flanks. There is midline abdominal incision. No hernias.  Vascular: Atherosclerotic changes are noted along the infrarenal abdominal aorta and branch vessels. No aneurysm.  MUSCULOSKELETAL FINDINGS  No osteoblastic or osteolytic lesions. Degenerative changes noted of the visualized spine. Bones are demineralized.  IMPRESSION: 1. Residual left retroperitoneal and left upper quadrant collection associated with postsurgical changes. The defined residual collection, along the posterior medial left retroperitoneum abutting the spleen and left psoas muscle, is smaller, now measuring 4.9 x 2.3 x 7.6 cm, previously 5.6 x 2.6 x 9.2 cm. Left upper quadrant postsurgical and inflammatory changes include thickening of the posterior wall of the stomach, stable. There are numerous vascular clips in the left retroperitoneum and left posterior medial upper quadrant. 2. Fairly high-grade partial small bowel obstruction with the transition point in the posterior medial left upper quadrant adjacent to the residual collection and postsurgical changes. This is similar to prior study. 3. No convincing local residual retroperitoneal sarcoma. 4. No evidence of metastatic disease from the sarcoma.   Electronically Signed   By: Dedra Skeens.D.  On: 11/23/2014 14:36   Dg Abd Acute W/chest  11/24/2014   CLINICAL DATA:  Bloating and nausea since yesterday. Vomiting today. History of bowel obstructions. History of retroperitoneal liposarcoma.  EXAM: DG ABDOMEN ACUTE W/ 1V CHEST  COMPARISON:  CT abdomen and pelvis 11/23/2014  FINDINGS: Right  PICC catheter tip over the low SVC region. No pneumothorax. Linear fibrosis or atelectasis in the left lung base. No focal airspace disease or consolidation in the lungs. Normal heart size and pulmonary vascularity.  Surgical clips in the upper abdomen. Contrast material is demonstrated in the right colon. There are prominent gas-filled distended small bowel loops in the mid abdomen and multiple air-fluid levels are demonstrated on the upright view. Appearance is consistent with mechanical small bowel obstruction. No free intra-abdominal air. No radiopaque stones. Degenerative changes in the spine.  IMPRESSION: No evidence of active pulmonary disease. Fibrosis or atelectasis in the left lung base.  Gaseous distention of mid abdominal small bowel with multiple air-fluid levels consistent with mechanical small bowel obstruction.   Electronically Signed   By: Lucienne Capers M.D.   On: 11/24/2014 21:51    Objective  Filed Vitals:   11/25/14 0006 11/25/14 0033 11/25/14 0503 11/25/14 0753  BP: 132/71 134/68 132/74 142/76  Pulse: 95 98 104 102  Temp: 98.7 F (37.1 C) 98.5 F (36.9 C) 98.6 F (37 C) 98.8 F (37.1 C)  TempSrc: Oral Oral Oral Oral  Resp: 18 18 20 21   Height:      Weight:      SpO2: 96% 99% 98% 98%    Intake/Output Summary (Last 24 hours) at 11/25/14 1116 Last data filed at 11/25/14 0635  Gross per 24 hour  Intake    870 ml  Output    600 ml  Net    270 ml   Filed Weights   11/24/14 2020  Weight: 73.936 kg (163 lb)    Exam:  GENERAL: NAD  HEENT: head NCAT, no scleral icterus. Pupils round and reactive.   NECK: Supple. Marland Kitchen  LUNGS: Clear to auscultation. No wheezing or crackles  HEART: Regular rate and rhythm without murmur. 2+ pulses, no JVD, no peripheral edema  ABDOMEN: Soft, nontender, and nondistended. No bowel sounds.   EXTREMITIES: Without any cyanosis, clubbing, rash, lesions or edema.  NEUROLOGIC: Alert and oriented x3. Cranial nerves II through XII are  grossly intact. Strength 5/5 in all 4.  PSYCHIATRIC: Normal mood and affect  Data Reviewed: Basic Metabolic Panel:  Recent Labs Lab 11/24/14 2130 11/25/14 0530  NA 132* 132*  K 3.2* 3.4*  CL 99* 102  CO2 20* 23  GLUCOSE 96 90  BUN 12 12  CREATININE 1.29* 1.14  CALCIUM 8.7* 8.4*  MG  --  1.5*   Liver Function Tests:  Recent Labs Lab 11/24/14 2130  AST 32  ALT 21  ALKPHOS 63  BILITOT 0.3  PROT 5.7*  ALBUMIN 2.5*    Recent Labs Lab 11/24/14 2130  LIPASE 47   CBC:  Recent Labs Lab 11/24/14 2130 11/25/14 0530  WBC 5.8 5.3  NEUTROABS 4.7  --   HGB 8.6* 9.0*  HCT 26.7* 26.5*  MCV 80.7 80.1  PLT 447* 453*    Scheduled Meds: . sodium chloride   Intravenous STAT  . linezolid  600 mg Intravenous Q12H  . metronidazole  500 mg Intravenous Q8H   Continuous Infusions: . sodium chloride 125 mL/hr at 11/24/14 2119  . sodium chloride       Costin Gherghe,  MD [facility] Pager [phone]. If 7 PM - 7 AM, please contact night-coverage at [email or web address], password [facility] 11/25/2014, 11:16 AM  LOS: 1 day

## 2014-11-25 NOTE — Progress Notes (Signed)
[facility]  Patient Status:   Active pt with [facility] prior to this readmission  [facility] is providing the following services: HHRN and Home Infusion Pharmacy as pt did just complete his home IV ABX on 11/22/14 and was transitioned to po ABX.  [facility] hospital team will follow Darryl Griffith while an inpatient to support any home care/infusion needs he may have upon DC.   If patient discharges after hours, please call [phone].   [name] 11/25/2014, 7:16 AM

## 2014-11-25 NOTE — Consult Note (Signed)
Re:   Darryl Griffith DOB:   01/01/1946 MRN:   [id number]  ASSESSMENT AND PLAN: 1.  Recurrent SBO  Probably based in part on retroperitoneal infection, surgery, and rad tx.  Plan:  IVF, NGT, repeat KUB tomorrow  On KUB last PM, there is contrast in the colon.  2.  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 HTN 4. Anemia, post op and chronic disease - Hgb - 9.0 - 11/25/2014 5.  On isolation for VRE  Chief Complaint  Patient presents with  . Nausea  . Emesis   REFERRING PHYSICIAN: Gennette Pac, MD  HISTORY OF PRESENT ILLNESS: Darryl Griffith is a 69 y.o. (DOB: 07/23/1945)  white  male whose primary care physician is Gennette Pac, MD and is admitted to [facility] for a SBO.  Darryl Griffith had a resection of a retroperitneal sarcoma by Dr. Barry Dienes on 07/13/2014.  He received post op rad tx by Dr. Tammi Klippel from 07/13/2014 to 10/13/2014.  His post op course has been complicated by a fluid collection/abscess in the retroperitoneum.  He has been admitted at least twice before for a retroperitoneal abscess that has required perc drain.  He was hospitalized 10/11/2014 - 10/20/2014 and had a perc drain on 10/12/2014.  His last drain was removed 10/31/2014, but he is on IV antibiotics at home.  Over the last several days, he has had increased distention.  His last flatus was Thursday, 8/24.   He has a small BM the same day.  I seems like Dr. Tammi Klippel ordered a CT scan for evaluation of recurrent disease - when he saw the report he sent message to Dr. Barry Dienes about SBO.  Darryl Griffith was admitted on 11/07/2014 for SBO.  He had a CT scan on 12/24/2014 - Residual left retroperitoneal and left upper quadrant collection  associated with postsurgical changes. The defined residual collection, along the posterior medial left retroperitoneum abutting the spleen and left psoas muscle, is smaller, now measuring 4.9 x 2.3 x 7.6 cm, previously 5.6 x 2.6 x 9.2 cm. Left upper quadrant postsurgical and inflammatory changes include thickening of the posterior wall of the stomach, stable. There are numerous vascular clips in the left retroperitoneum and left posterior medial upper quadrant.  2. Fairly high-grade partial small bowel obstruction with the transition point in the posterior medial left upper quadrant adjacent to the residual collection and postsurgical changes. This is similar to prior study.  3. No convincing local residual retroperitoneal sarcoma.  4. No evidence of metastatic disease from the sarcoma.    Past Medical History  Diagnosis Date  . Hypertension   . Elevated cholesterol   . Anginal pain     hx of CP saw Dr Wynonia Lawman 09/20/2013 H&P per chart   . Left Retroperitoneal mass s/p resection/L nephrectomy 07/13/2014   . Postoperative anemia due to acute blood loss 07/15/2014  . SBO (small bowel obstruction) 08/01/2014, 10/11/14  . Retroperitoneal fluid collection   . 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 Surgery for arm fracture Right 1952  . Knee arthroscopy Right 1970's  . Colonoscopy with propofol N/A  10/20/2013    Procedure: COLONOSCOPY WITH PROPOFOL;  Surgeon: Lear Ng, MD;  Location: [facility] ENDOSCOPY;  Service: Endoscopy;  Laterality: N/A;  . Hot hemostasis N/A 10/20/2013    Procedure: HOT HEMOSTASIS (ARGON PLASMA COAGULATION/BICAP);  Surgeon: Lear Ng, MD;  Location: [facility];  Service: Endoscopy;  Laterality: N/A;  . Tigger finger release      per H&P per Dr Wynonia Lawman 09/20/2013 (on chart)  . Resection of retroperitoneal mass Left 07/13/2014     Procedure: RESECTION OF left RETROPERITONEAL MASS;  Surgeon: Stark Klein, MD;  Location: [facility] ORS;  Service: General;  Laterality: Left;  . Esophagogastroduodenoscopy (egd) with propofol N/A 09/19/2014    Procedure: ESOPHAGOGASTRODUODENOSCOPY (EGD) WITH PROPOFOL;  Surgeon: Wilford Corner, MD;  Location: [facility]OSCOPY;  Service: Endoscopy;  Laterality: N/A;      Current Facility-Administered Medications  Medication Dose Route Frequency Provider Last Rate Last Dose  . 0.9 %  sodium chloride infusion   Intravenous Continuous Daleen Bo, MD 125 mL/hr at 11/24/14 2119    . 0.9 %  sodium chloride infusion   Intravenous STAT Daleen Bo, MD 100 mL/hr at 11/25/14 0053    . 0.9 %  sodium chloride infusion   Intravenous Continuous Theressa Millard, MD      . acetaminophen (TYLENOL) tablet 650 mg  650 mg Oral Q6H PRN Theressa Millard, MD       Or  . acetaminophen (TYLENOL) suppository 650 mg  650 mg Rectal Q6H PRN Theressa Millard, MD      . HYDROmorphone (DILAUDID) injection 0.5-1 mg  0.5-1 mg Intravenous Q3H PRN Theressa Millard, MD      . linezolid (ZYVOX) IVPB 600 mg  600 mg Intravenous Q12H Theressa Millard, MD 300 mL/hr at 11/25/14 0019 600 mg at 11/25/14 0019  . metroNIDAZOLE (FLAGYL) IVPB 500 mg  500 mg Intravenous Q8H Theressa Millard, MD   500 mg at 11/25/14 0133  . ondansetron (ZOFRAN) tablet 4 mg  4 mg Oral Q6H PRN Theressa Millard, MD       Or  . ondansetron (ZOFRAN) injection 4 mg  4 mg Intravenous Q6H PRN Theressa Millard, MD   4 mg at 11/25/14 0322  . potassium chloride 10 mEq in 100 mL IVPB  10 mEq Intravenous Q1 Hr x 3 Theressa Millard, MD   10 mEq at 11/25/14 0612  . sodium chloride 0.9 % injection 10-40 mL  10-40 mL Intracatheter PRN Md Ccs, MD   10 mL at 11/25/14 0532      Allergies  Allergen Reactions  . Compazine [Prochlorperazine] Other (See Comments)    Made pt really lethargic, and sleepy    REVIEW OF SYSTEMS: Skin:  No history of rash.  No history  of abnormal moles. Infection:  No history of hepatitis or HIV.  No history of MRSA. Neurologic:  No history of stroke.  No history of seizure.  No history of headaches. Cardiac:  HTN  No history of seeing a cardiologist. Pulmonary:  Does not smoke cigarettes.  No asthma or bronchitis.  No OSA/CPAP.  Endocrine:  No diabetes. No thyroid disease. Gastrointestinal:  No history of stomach disease.  No history of liver disease.  No history of gall bladder disease.  No history of pancreas disease.  No history of colon disease.  See HPI Urologic:  No history of kidney stones.  No history of bladder infections. Musculoskeletal:  History of knee arthroscopy Hematologic:  No bleeding disorder.  No history of anemia.  Not anticoagulated. Psycho-social:  The patient is oriented.   The patient has no obvious psychologic or social impairment to understanding our conversation and plan.  SOCIAL and FAMILY HISTORY: Married  PHYSICAL EXAM: BP 132/74 mmHg  Pulse 104  Temp(Src) 98.6 F (37 C) (Oral)  Resp 20  Ht 5\' 8"  (1.727 m)  Wt 73.936 kg (163 lb)  BMI 24.79 kg/m2  SpO2 98%  General: WN somewhat anxious WM who is alert.  HEENT: Normal. Pupils equal. Neck: Supple. No mass.  No thyroid mass. Lymph Nodes:  No supraclavicular or cervical nodes. Lungs: Clear to auscultation and symmetric breath sounds. Heart:  RRR. No murmur or rub. Abdomen: Soft. No mass. No tenderness. Midline scar. Mild distention with minimal bowel sounds. Rectal: Not done. Extremities:  Good strength and ROM  in upper and lower extremities. Neurologic:  Grossly intact to motor and sensory function. Psychiatric: Has normal mood and affect. Behavior is normal.   DATA REVIEWED: Epic notes.  Alphonsa Overall, MD,  [facility], [address]  [place], [place]    [place] Phone:  [phone] FAX:  [phone]

## 2014-11-26 ENCOUNTER — Encounter (HOSPITAL_COMMUNITY): Payer: Self-pay | Admitting: Physician Assistant

## 2014-11-26 ENCOUNTER — Inpatient Hospital Stay (HOSPITAL_COMMUNITY): Payer: Medicare Other

## 2014-11-26 LAB — BASIC METABOLIC PANEL
ANION GAP: 6 (ref 5–15)
BUN: 12 mg/dL (ref 6–20)
CALCIUM: 8 mg/dL — AB (ref 8.9–10.3)
CO2: 25 mmol/L (ref 22–32)
Chloride: 104 mmol/L (ref 101–111)
Creatinine, Ser: 1.14 mg/dL (ref 0.61–1.24)
GLUCOSE: 88 mg/dL (ref 65–99)
POTASSIUM: 3.3 mmol/L — AB (ref 3.5–5.1)
SODIUM: 135 mmol/L (ref 135–145)

## 2014-11-26 LAB — MAGNESIUM: MAGNESIUM: 1.9 mg/dL (ref 1.7–2.4)

## 2014-11-26 MED ORDER — POTASSIUM CHLORIDE 10 MEQ/100ML IV SOLN
10.0000 meq | INTRAVENOUS | Status: AC
  Administered 2014-11-26 (×5): 10 meq via INTRAVENOUS
  Filled 2014-11-26 (×5): qty 100

## 2014-11-26 MED ORDER — FAT EMULSION 20 % IV EMUL
240.0000 mL | INTRAVENOUS | Status: AC
  Administered 2014-11-26: 240 mL via INTRAVENOUS
  Filled 2014-11-26: qty 250

## 2014-11-26 MED ORDER — LORAZEPAM 2 MG/ML IJ SOLN
0.5000 mg | Freq: Every evening | INTRAMUSCULAR | Status: DC | PRN
  Administered 2014-11-26 – 2014-12-16 (×15): 0.5 mg via INTRAVENOUS
  Filled 2014-11-26 (×15): qty 1

## 2014-11-26 MED ORDER — TRACE MINERALS CR-CU-MN-SE-ZN 10-1000-500-60 MCG/ML IV SOLN
INTRAVENOUS | Status: AC
  Administered 2014-11-26: 17:00:00 via INTRAVENOUS
  Filled 2014-11-26: qty 960

## 2014-11-26 MED ORDER — SODIUM CHLORIDE 0.9 % IV SOLN
INTRAVENOUS | Status: AC
  Administered 2014-11-27: 06:00:00 via INTRAVENOUS

## 2014-11-26 MED ORDER — HEPARIN SODIUM (PORCINE) 5000 UNIT/ML IJ SOLN
5000.0000 [IU] | Freq: Three times a day (TID) | INTRAMUSCULAR | Status: DC
  Administered 2014-11-26 – 2014-12-17 (×59): 5000 [IU] via SUBCUTANEOUS
  Filled 2014-11-26 (×67): qty 1

## 2014-11-26 NOTE — Progress Notes (Signed)
General Surgery Note  LOS: 2 days  POD -     Assessment/Plan: 1. Recurrent SBO Probably based in part on retroperitoneal infection, surgery, and rad tx. Plan: IVF, NGT, repeat KUB tomorrow On KUB on 8/26 showed contrast in the colon.  Today's KUB is pending.  450 cc out of NGT.  Clearly looks better today.  Had BM.  2. RESECTION OF left RETROPERITONEAL MASS - 07/13/2014 - F. 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 HTN 4. Anemia, post op and chronic disease - Hgb - 9.0 - 11/25/2014 5. On isolation for VRE 6.  DVT prophylaxis - SQ Heparin 7.  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  Had BM.  In room by self. Objective:   Filed Vitals:   11/26/14 0542  BP: 143/84  Pulse: 94  Temp: 98.7 F (37.1 C)  Resp: 18     Intake/Output from previous day:  08/27 0701 - 08/28 0700 In: -  Out: 742 [Urine:425; Emesis/NG output:450]  Intake/Output this shift:      Physical Exam:   General: Older WM who is alert and oriented.   Has NGT.   HEENT: Normal. Pupils equal. .   Lungs: Clear   Abdomen: softer with BS.   Wound: Well healed abdominal incision    Lab Results:    Recent Labs  11/24/14 2130 11/25/14 0530  WBC 5.8 5.3  HGB 8.6* 9.0*  HCT 26.7* 26.5*  PLT 447* 453*    BMET   Recent Labs  11/25/14 0530 11/26/14 0418  NA  132* 135  K 3.4* 3.3*  CL 102 104  CO2 23 25  GLUCOSE 90 88  BUN 12 12  CREATININE 1.14 1.14  CALCIUM 8.4* 8.0*    PT/INR  No results for input(s): LABPROT, INR in the last 72 hours.  ABG  No results for input(s): PHART, HCO3 in the last 72 hours.  Invalid input(s): PCO2, PO2   Studies/Results:  Dg Abd Acute W/chest  11/24/2014   CLINICAL DATA:  Bloating and nausea since yesterday. Vomiting today. History of bowel obstructions. History of retroperitoneal liposarcoma.  EXAM: DG ABDOMEN ACUTE W/ 1V CHEST  COMPARISON:  CT abdomen and pelvis 11/23/2014  FINDINGS: Right PICC catheter tip over the low SVC region. No pneumothorax. Linear fibrosis or atelectasis in the left lung base. No focal airspace disease or consolidation in the lungs. Normal heart size and pulmonary vascularity.  Surgical clips in the upper abdomen. Contrast material is demonstrated in the right colon. There are prominent gas-filled distended small bowel loops in the mid abdomen and multiple air-fluid levels are demonstrated on the upright view. Appearance is consistent with mechanical small bowel obstruction. No free intra-abdominal air. No radiopaque stones. Degenerative changes in the spine.  IMPRESSION: No evidence of active pulmonary disease. Fibrosis or atelectasis in the left lung base.  Gaseous distention of mid abdominal small bowel with multiple air-fluid levels consistent with mechanical small bowel obstruction.   Electronically Signed  By: Lucienne Capers M.D.   On: 11/24/2014 21:51     Anti-infectives:   Anti-infectives    Start     Dose/Rate Route Frequency Ordered Stop   11/25/14 0000  metroNIDAZOLE (FLAGYL) IVPB 500 mg     500 mg 100 mL/hr over 60 Minutes Intravenous Every 8 hours 11/24/14 2355     11/25/14 0000  linezolid (ZYVOX) IVPB 600 mg     600 mg 300 mL/hr over 60 Minutes Intravenous Every 12 hours 11/24/14 2355        Alphonsa Overall, MD, FACS Pager: [place] [facility] Office:  [phone] 11/26/2014

## 2014-11-26 NOTE — Progress Notes (Signed)
PROGRESS NOTE  [name] OEV:[id number] DOB: 08-30-1945 DOA: 11/24/2014 PCP: Gennette Pac, MD   HPI: Darryl Griffith is a 69 y.o. male with a history of Left Flank Area Liposarcoma S/P Resection and Left Nephrectomy 06/2014 with Radiation Rx completed on 82/9937 complicated by abscess formation (+VRE), s/p drain placement for ~ 3 weeks (removed 8/2), was admitted on 8/26 with nausea/vomiting found to have SBO. General surgery was consulted on admission.   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 Likely on Monday  HTN - prn hydralazine  Hypokalemia - monitor  Hypomagnesemia - improved with repletion   Diet: TPN (CLINIMIX-E) Adult Fluids: NS DVT Prophylaxis: SCD  Code Status: Full Code Family Communication: no family bedside  Disposition Plan: remain inpatient  Consultants:  General surgery   Procedures:  None    Antibiotics  Anti-infectives    Start     Dose/Rate Route Frequency  Ordered Stop   11/25/14 0000  metroNIDAZOLE (FLAGYL) IVPB 500 mg     500 mg 100 mL/hr over 60 Minutes Intravenous Every 8 hours 11/24/14 2355     11/25/14 0000  linezolid (ZYVOX) IVPB 600 mg     600 mg 300 mL/hr over 60 Minutes Intravenous Every 12 hours 11/24/14 2355         Studies  Dg Abd 2 Views  11/26/2014   CLINICAL DATA:  Abdominal distention with nausea and vomiting. Surgery in April. Small bowel obstruction symptoms.  EXAM: ABDOMEN - 2 VIEW  COMPARISON:  11/24/2014  FINDINGS: Upright and supine views. Upright view demonstrates persistent small bowel air-fluid levels, increased. Small left pleural effusion. There may be a nasogastric tube looped in the esophagus, incompletely imaged. Surgical clips in the upper abdomen. A metallic object projecting over the a lower chest is presumably external to the patient.  Supine images demonstrate small bowel distension. Example loop measures 6.7 cm today, versus 6.4 cm on the prior. Contrast is identified within the colon. No pneumatosis.  IMPRESSION: Similar small bowel obstruction pattern, without free intraperitoneal air or other acute complication.  The nasogastric may be looped in the distal esophagus. Incompletely imaged. These results will be called to the ordering clinician or representative by the Radiologist Assistant, and communication documented in the PACS or zVision Dashboard.   Electronically Signed   By: Abigail Miyamoto M.D.   On: 11/26/2014 10:07   Dg Abd Acute W/chest  11/24/2014   CLINICAL DATA:  Bloating and nausea since yesterday. Vomiting today. History of bowel obstructions. History of retroperitoneal liposarcoma.  EXAM: DG ABDOMEN ACUTE W/ 1V CHEST  COMPARISON:  CT abdomen and pelvis 11/23/2014  FINDINGS: Right PICC catheter tip over the low SVC region. No pneumothorax. Linear fibrosis or atelectasis in the left lung base. No focal airspace disease or consolidation in the lungs. Normal heart size and pulmonary vascularity.   Surgical clips in the upper abdomen. Contrast material is demonstrated in the right colon. There are prominent gas-filled distended small bowel loops in the mid abdomen and multiple air-fluid levels are demonstrated on the upright view. Appearance is consistent with mechanical small bowel obstruction. No free intra-abdominal air. No radiopaque stones. Degenerative changes in the spine.  IMPRESSION: No evidence of active pulmonary disease. Fibrosis or atelectasis in the left lung base.  Gaseous distention of mid abdominal small bowel with multiple air-fluid levels consistent with mechanical small bowel obstruction.   Electronically Signed   By: Lucienne Capers M.D.   On: 11/24/2014 21:51    Objective  Filed Vitals:   11/25/14 1457 11/25/14 2129 11/26/14 0542 11/26/14 0826  BP: 134/67 130/77 143/84 122/73  Pulse: 65 88 94   Temp: 98.2 F (36.8 C) 98 F (36.7 C) 98.7 F (37.1 C)   TempSrc: Oral Oral Oral   Resp: 20 19 18    Height:      Weight:      SpO2: 98% 98% 97%     Intake/Output Summary (Last 24 hours) at 11/26/14 1133 Last data filed at 11/26/14 0400  Gross per 24 hour  Intake      0 ml  Output    875 ml  Net   -875 ml   Filed Weights   11/24/14 2020  Weight: 73.936 kg (163 lb)    Exam:  GENERAL: NAD  LUNGS: Clear to auscultation. No wheezing or crackles  HEART: Regular rate and rhythm without murmur. 2+ pulses, no JVD, no peripheral edema  ABDOMEN: Soft, nontender, and nondistended. No bowel sounds.   EXTREMITIES: Without any cyanosis, clubbing, rash, lesions or edema.  NEUROLOGIC: Strength 5/5 in all 4.  Data Reviewed: Basic Metabolic Panel:  Recent Labs Lab 11/24/14 2130 11/25/14 0530 11/26/14 0418  NA 132* 132* 135  K 3.2* 3.4* 3.3*  CL 99* 102 104  CO2 20* 23 25  GLUCOSE 96 90 88  BUN 12 12 12   CREATININE 1.29* 1.14 1.14  CALCIUM 8.7* 8.4* 8.0*  MG  --  1.5* 1.9   Liver Function Tests:  Recent Labs Lab 11/24/14 2130  AST 32  ALT 21    ALKPHOS 63  BILITOT 0.3  PROT 5.7*  ALBUMIN 2.5*    Recent Labs Lab 11/24/14 2130  LIPASE 47   CBC:  Recent Labs Lab 11/24/14 2130 11/25/14 0530  WBC 5.8 5.3  NEUTROABS 4.7  --   HGB 8.6* 9.0*  HCT 26.7* 26.5*  MCV 80.7 80.1  PLT 447* 453*    Scheduled Meds: . bisacodyl  10 mg Rectal Daily  . heparin subcutaneous  5,000 Units Subcutaneous 3 times per day  . linezolid  600 mg Intravenous Q12H  . lip balm  1 application Topical BID  . metronidazole  500 mg Intravenous Q8H  . potassium chloride  10 mEq Intravenous Q1 Hr x 5   Continuous Infusions: . sodium chloride    . Marland KitchenTPN (CLINIMIX-E) Adult     And  . fat emulsion       Marzetta Board, MD [facility] Pager [phone]. If 7 PM - 7 AM, please contact night-coverage at [email or web address], password [facility] 11/26/2014, 11:33 AM  LOS: 2  days

## 2014-11-26 NOTE — H&P (Signed)
HPI:  The patient has had a H&P performed within the last 30 days, all history, medications, and exam have been reviewed. The patient denies any interval changes since the H&P.  [name] is familiar to IR service from prior placement of a left retroperitoneal fluid collection drain on 08/01/14 and again on 10/12/2014 secondary to recurrence.   He is status post left nephrectomy and excision of retroperitoneal liposarcoma in April 4193 which was complicated by development of a left retroperitoneal abscess.   The most recent drain was removed on 10/31/14 following injection study which revealed no evidence of fistulization and minimal residual collapsed abscess cavity.   He presented to hospital on 8/9 with abdominal pain, nausea and vomiting.  Subsequent imaging revealed moderate small bowel obstruction as well as residual fluid collection with gas in the left retroperitoneal region. Aspiration and culture of left retroperitoneal fluid collection was done on 11/10/14 by Dr. Kathlene Griffith.  [name] has presented again to the ED on 11/24/14 c/o abdominal bloating and N/V  A CT scan was performed on the 25th for cancer staging and revealed a high grade partial small bowl obstruction and General surgery was consulted and an NGT was placed.It also showed residual left retroperitoneal and left upper quadrant collection associated with postsurgical changes. The defined residual collection, along the posterior medial left retroperitoneum abutting the spleen and left psoas muscle, is smaller, now measuring 4.9 x 2.3 x 7.6 cm, previously 5.6 x 2.6 x 9.2 cm.  He is on oral Linezolid and Metronidazole for +VRE For a chronic intra-abdominal abscess.   We are asked to repeat an image guided aspiration to send for cultures to help determine appropriate antibiotic course.  Medications: Prior to Admission medications   Medication Sig Start Date End Date Taking? Authorizing Provider  ferrous sulfate 325 (65  FE) MG tablet TAKE 1 TABLET (325 MG TOTAL) BY MOUTH 2 (TWO) TIMES DAILY WITH A MEAL. 10/20/14  Yes Historical Provider, MD  linezolid (ZYVOX) 600 MG tablet Take 1 tablet (600 mg total) by mouth 2 (two) times daily. 11/15/14  Yes Truman Hayward, MD  LORazepam (ATIVAN) 1 MG tablet Take 1 tablet by mouth 3 (three) times daily as needed for anxiety. Anxiety/nausea 09/01/14  Yes Historical Provider, MD  Melatonin 5 MG CAPS Take 1 capsule by mouth at bedtime.  07/27/14  Yes Historical Provider, MD  metroNIDAZOLE (FLAGYL) 500 MG tablet Take 1 tablet (500 mg total) by mouth 3 (three) times daily. 11/15/14  Yes Truman Hayward, MD  Multiple Vitamin (MULTIVITAMIN WITH MINERALS) TABS tablet Take 1 tablet by mouth every morning.    Yes Historical Provider, MD  ondansetron (ZOFRAN ODT) 8 MG disintegrating tablet Take 1 tablet (8 mg total) by mouth 3 (three) times daily before meals. 11/10/14  Yes Debbe Odea, MD  pantoprazole (PROTONIX) 40 MG tablet Take 1 tablet (40 mg total) by mouth daily. 09/22/14  Yes Donne Hazel, MD  traZODone (DESYREL) 50 MG tablet Take 50 mg by mouth at bedtime.    Yes Historical Provider, MD     Vital Signs: BP 122/73 mmHg  Pulse 94  Temp(Src) 98.7 F (37.1 C) (Oral)  Resp 18  Ht 5\' 8"  (1.727 m)  Wt 163 lb (73.936 kg)  BMI 24.79 kg/m2  SpO2 97%  Physical Exam  Constitutional: He is oriented to person, place, and time. He appears well-developed and well-nourished.  HENT:  Head: Normocephalic and atraumatic.  Eyes: EOM are normal.  Neck:  Normal range of motion. Neck supple.  Cardiovascular: Normal rate, regular rhythm and normal heart sounds.   Pulmonary/Chest: Effort normal and breath sounds normal. He has no wheezes.  Abdominal: Soft. Bowel sounds are normal. There is no tenderness.  Musculoskeletal: Normal range of motion.  Neurological: He is alert and oriented to person, place, and time.  Skin: Skin is warm and dry.  Psychiatric: He has a normal mood and affect.  His behavior is normal. Judgment and thought content normal.  Vitals reviewed.   Mallampati Score:  MD Evaluation Airway: WNL Heart: WNL Abdomen: WNL Chest/ Lungs: WNL ASA  Classification: 3 Mallampati/Airway Score: Two  Labs:  CBC:  Recent Labs  11/09/14 0535 11/10/14 0448 11/24/14 2130 11/25/14 0530  WBC 8.8 7.7 5.8 5.3  HGB 8.7* 8.7* 8.6* 9.0*  HCT 26.9* 28.1* 26.7* 26.5*  PLT 462* 492* 447* 453*    COAGS:  Recent Labs  07/14/14 1055 07/14/14 1830 07/15/14 0058 07/15/14 0515 07/16/14 0700 09/18/14 1400 11/10/14 1410  INR 1.20  --   --   --  1.21 1.06 1.20  APTT 33 36 31 33  --  30  --     BMP:  Recent Labs  11/10/14 0448 11/24/14 2130 11/25/14 0530 11/26/14 0418  NA 139 132* 132* 135  K 3.8 3.2* 3.4* 3.3*  CL 105 99* 102 104  CO2 23 20* 23 25  GLUCOSE 88 96 90 88  BUN 9 12 12 12   CALCIUM 8.7* 8.7* 8.4* 8.0*  CREATININE 1.02 1.29* 1.14 1.14  GFRNONAA >60 55* >60 >60  GFRAA >60 >60 >60 >60    LIVER FUNCTION TESTS:  Recent Labs  11/08/14 0535 11/09/14 0535 11/10/14 0448 11/24/14 2130  BILITOT 0.5 0.5 0.5 0.3  AST 14* 15 16 32  ALT 10* 10* 11* 21  ALKPHOS 90 86 89 63  PROT 6.1* 5.7* 6.3* 5.7*  ALBUMIN 2.2* 2.3* 2.4* 2.5*    Assessment/Plan:   Residual left retroperitoneal and left upper quadrant collection now measuring 4.9 x 2.3 x 7.6 cm, previously 5.6 x 2.6 x 9.2 cm.  Will proceed with Image guided drainage, possible placement of drain, by Dr. Kathlene Griffith tomorrow.  Will recheck PT/INR and PTT, hold tonight and tomorrow heparin dose.  Risks and Benefits discussed with the patient including bleeding, infection, damage to adjacent structures, bowel perforation/fistula connection, and sepsis.  All of the patient's questions were answered, patient is agreeable to proceed. Consent signed and in chart.   Signed: Murrell Redden PA-C 11/26/2014, 10:49 AM   I spent a total of 15 Minutes in face to face in clinical  consultation/evaluation, greater than 50% of which was counseling/coordinating care for drainage of retroperitoneal abscess.

## 2014-11-26 NOTE — Progress Notes (Addendum)
PARENTERAL NUTRITION CONSULT NOTE - INITIAL  Pharmacy Consult for TPN Indication: SBO  Allergies  Allergen Reactions  . Compazine [Prochlorperazine] Other (See Comments)    Made pt really lethargic, and sleepy   Patient Measurements: Height: 5\' 8"  (172.7 cm) Weight: 163 lb (73.936 kg) IBW/kg (Calculated) : 68.4  Vital Signs: Temp: 98.7 F (37.1 C) (08/28 0542) Temp Source: Oral (08/28 0542) BP: 122/73 mmHg (08/28 0826) Pulse Rate: 94 (08/28 0542) Intake/Output from previous day: 08/27 0701 - 08/28 0700 In: -  Out: 875 [Urine:425; Emesis/NG output:450] Intake/Output from this shift:    Labs:  Recent Labs  11/24/14 2130 11/25/14 0530  WBC 5.8 5.3  HGB 8.6* 9.0*  HCT 26.7* 26.5*  PLT 447* 453*    Recent Labs  11/24/14 2130 11/25/14 0530 11/26/14 0418  NA 132* 132* 135  K 3.2* 3.4* 3.3*  CL 99* 102 104  CO2 20* 23 25  GLUCOSE 96 90 88  BUN 12 12 12   CREATININE 1.29* 1.14 1.14  CALCIUM 8.7* 8.4* 8.0*  MG  --  1.5* 1.9  PROT 5.7*  --   --   ALBUMIN 2.5*  --   --   AST 32  --   --   ALT 21  --   --   ALKPHOS 63  --   --   BILITOT 0.3  --   --    Estimated Creatinine Clearance: 58.3 mL/min (by C-G formula based on Cr of 1.14).   No results for input(s): GLUCAP in the last 72 hours.  Medical History: Past Medical History  Diagnosis Date  . Hypertension   . Anginal pain     hx of CP saw Dr Wynonia Lawman 09/20/2013 H&P per chart   . Left Retroperitoneal mass s/p resection/L nephrectomy 07/13/2014   . Postoperative anemia due to acute blood loss 07/15/2014  . SBO (small bowel obstruction) 08/01/2014, 10/11/14  . Retroperitoneal fluid collection   . 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 Impaired fasting glucose 11/25/2014  . Benign neoplasm of colon 11/25/2014  . Actinic keratosis 11/25/2014  . Mixed hyperlipidemia 11/25/2014   Medications:  Scheduled:  . bisacodyl  10 mg Rectal  Daily  . heparin subcutaneous  5,000 Units Subcutaneous 3 times per day  . linezolid  600 mg Intravenous Q12H  . lip balm  1 application Topical BID  . metronidazole  500 mg Intravenous Q8H  . potassium chloride  10 mEq Intravenous Q1 Hr x 5   Infusions:  . sodium chloride 125 mL/hr at 11/26/14 0803  . Marland KitchenTPN (CLINIMIX-E) Adult     And  . fat emulsion     Insulin Requirements: none   Current Nutrition: NPO from admission  IVF: NS at 125 ml/hr  Central access: PICC placed 10/12/14 TPN start date: 8/28  ASSESSMENT  HPI:  68 yoM s/p resection L retroperitoneal sarcoma, L nephrectomy 07/13/14 developed VRE abscess treated with po Linezolid/Flagyl. Hx abd bloating, N/V 2 days PTA, SBO seen on CT for Ca restaging.  Hx recurrent SBO. Begin TPN per Pharmacy  Significant events:    Today:    Glucose - wnl  Electrolytes - K 3.3, Mg wnl at 1.9, CorrCa 8.8  Renal - wnl  LFTs - wnl  TGs - pending  Prealbumin - pending  NUTRITIONAL GOALS                                                                                             RD recs: pending Clinimix E 5/15 at a goal rate of 112ml/hr + 20% fat emulsion at 10 ml/hr to provide: 120 g/day protein, 2200 Kcal/day (based on previous assessment last admit)  PLAN                                                                                                                         At 1800 today:  Start Clinimix E 5/15 at 40 ml/hr.  20% fat emulsion at 10 ml/hr.  KCL 10 mEq IVPB qhr x5  Plan to advance as tolerated to the goal rate.  TPN to contain standard multivitamins and trace elements.  Reduce IVF to 75 ml/hr.  Assess need to add SSI   TPN lab panels on Mondays & Thursdays.  F/u daily.  Minda Ditto PharmD Pager [phone] 11/26/2014, 10:20 AM

## 2014-11-27 ENCOUNTER — Inpatient Hospital Stay (HOSPITAL_COMMUNITY): Payer: Medicare Other

## 2014-11-27 ENCOUNTER — Encounter (HOSPITAL_COMMUNITY): Payer: Self-pay | Admitting: Radiology

## 2014-11-27 LAB — COMPREHENSIVE METABOLIC PANEL
ALT: 17 U/L (ref 17–63)
AST: 25 U/L (ref 15–41)
Albumin: 2.3 g/dL — ABNORMAL LOW (ref 3.5–5.0)
Alkaline Phosphatase: 51 U/L (ref 38–126)
Anion gap: 5 (ref 5–15)
BILIRUBIN TOTAL: 0.4 mg/dL (ref 0.3–1.2)
BUN: 13 mg/dL (ref 6–20)
CO2: 25 mmol/L (ref 22–32)
CREATININE: 1.03 mg/dL (ref 0.61–1.24)
Calcium: 8 mg/dL — ABNORMAL LOW (ref 8.9–10.3)
Chloride: 104 mmol/L (ref 101–111)
GFR calc Af Amer: >60 mL/min (ref >60)
Glucose, Bld: 125 mg/dL — ABNORMAL HIGH (ref 65–99)
Potassium: 3.5 mmol/L (ref 3.5–5.1)
Sodium: 134 mmol/L — ABNORMAL LOW (ref 135–145)
TOTAL PROTEIN: 5 g/dL — AB (ref 6.5–8.1)

## 2014-11-27 LAB — CBC
HCT: 24.7 % — ABNORMAL LOW (ref 39.0–52.0)
HEMOGLOBIN: 7.8 g/dL — AB (ref 13.0–17.0)
MCH: 25.6 pg — AB (ref 26.0–34.0)
MCHC: 31.6 g/dL (ref 30.0–36.0)
MCV: 81 fL (ref 78.0–100.0)
PLATELETS: 366 10*3/uL (ref 150–400)
RBC: 3.05 MIL/uL — ABNORMAL LOW (ref 4.22–5.81)
RDW: 17.4 % — ABNORMAL HIGH (ref 11.5–15.5)
WBC: 4 10*3/uL (ref 4.0–10.5)

## 2014-11-27 LAB — APTT: aPTT: 33 seconds (ref 24–37)

## 2014-11-27 LAB — PROTIME-INR
INR: 1.27 (ref 0.00–1.49)
Prothrombin Time: 16.1 seconds — ABNORMAL HIGH (ref 11.6–15.2)

## 2014-11-27 LAB — DIFFERENTIAL
Basophils Absolute: 0 10*3/uL (ref 0.0–0.1)
Basophils Relative: 1 % (ref 0–1)
EOS ABS: 0.1 10*3/uL (ref 0.0–0.7)
EOS PCT: 3 % (ref 0–5)
LYMPHS ABS: 0.3 10*3/uL — AB (ref 0.7–4.0)
Lymphocytes Relative: 8 % — ABNORMAL LOW (ref 12–46)
Monocytes Absolute: 0.5 10*3/uL (ref 0.1–1.0)
Monocytes Relative: 13 % — ABNORMAL HIGH (ref 3–12)
NEUTROS PCT: 77 % (ref 43–77)
Neutro Abs: 3.1 10*3/uL (ref 1.7–7.7)

## 2014-11-27 LAB — MAGNESIUM: MAGNESIUM: 1.7 mg/dL (ref 1.7–2.4)

## 2014-11-27 LAB — PREALBUMIN: PREALBUMIN: 12.1 mg/dL — AB (ref 18–38)

## 2014-11-27 LAB — GLUCOSE, CAPILLARY
GLUCOSE-CAPILLARY: 114 mg/dL — AB (ref 65–99)
Glucose-Capillary: 119 mg/dL — ABNORMAL HIGH (ref 65–99)
Glucose-Capillary: 120 mg/dL — ABNORMAL HIGH (ref 65–99)

## 2014-11-27 LAB — PHOSPHORUS: Phosphorus: 2.1 mg/dL — ABNORMAL LOW (ref 2.5–4.6)

## 2014-11-27 LAB — TRIGLYCERIDES: Triglycerides: 132 mg/dL (ref <150)

## 2014-11-27 MED ORDER — MAGNESIUM SULFATE IN D5W 10-5 MG/ML-% IV SOLN
1.0000 g | Freq: Once | INTRAVENOUS | Status: AC
  Administered 2014-11-27: 1 g via INTRAVENOUS
  Filled 2014-11-27: qty 100

## 2014-11-27 MED ORDER — INSULIN ASPART 100 UNIT/ML ~~LOC~~ SOLN
0.0000 [IU] | Freq: Four times a day (QID) | SUBCUTANEOUS | Status: DC
  Administered 2014-11-28 – 2014-11-29 (×4): 1 [IU] via SUBCUTANEOUS
  Administered 2014-11-29 (×2): 3 [IU] via SUBCUTANEOUS
  Administered 2014-11-30: 2 [IU] via SUBCUTANEOUS

## 2014-11-27 MED ORDER — MIDAZOLAM HCL 2 MG/2ML IJ SOLN
INTRAMUSCULAR | Status: AC | PRN
  Administered 2014-11-27: 0.5 mg via INTRAVENOUS
  Administered 2014-11-27: 1 mg via INTRAVENOUS

## 2014-11-27 MED ORDER — TRACE MINERALS CR-CU-MN-SE-ZN 10-1000-500-60 MCG/ML IV SOLN
INTRAVENOUS | Status: AC
  Administered 2014-11-27: 18:00:00 via INTRAVENOUS
  Filled 2014-11-27: qty 1680

## 2014-11-27 MED ORDER — MIDAZOLAM HCL 2 MG/2ML IJ SOLN
INTRAMUSCULAR | Status: AC
  Filled 2014-11-27: qty 6

## 2014-11-27 MED ORDER — FENTANYL CITRATE (PF) 100 MCG/2ML IJ SOLN
INTRAMUSCULAR | Status: AC
  Filled 2014-11-27: qty 6

## 2014-11-27 MED ORDER — POTASSIUM CHLORIDE 10 MEQ/100ML IV SOLN
10.0000 meq | INTRAVENOUS | Status: AC
  Administered 2014-11-27 (×2): 10 meq via INTRAVENOUS
  Filled 2014-11-27 (×2): qty 100

## 2014-11-27 MED ORDER — SODIUM CHLORIDE 0.9 % IV SOLN: Freq: Once | INTRAVENOUS | Status: DC

## 2014-11-27 MED ORDER — POTASSIUM PHOSPHATES 15 MMOLE/5ML IV SOLN
10.0000 mmol | Freq: Once | INTRAVENOUS | Status: AC
  Administered 2014-11-27: 10 mmol via INTRAVENOUS
  Filled 2014-11-27: qty 3.33

## 2014-11-27 MED ORDER — FAT EMULSION 20 % IV EMUL
240.0000 mL | INTRAVENOUS | Status: AC
  Administered 2014-11-27: 240 mL via INTRAVENOUS
  Filled 2014-11-27: qty 250

## 2014-11-27 MED ORDER — FENTANYL CITRATE (PF) 100 MCG/2ML IJ SOLN
INTRAMUSCULAR | Status: AC | PRN
  Administered 2014-11-27: 50 ug via INTRAVENOUS
  Administered 2014-11-27: 25 ug via INTRAVENOUS

## 2014-11-27 MED ORDER — SODIUM CHLORIDE 0.9 % IV SOLN
INTRAVENOUS | Status: AC
  Administered 2014-11-27: 19:00:00 via INTRAVENOUS

## 2014-11-27 NOTE — Procedures (Signed)
Interventional Radiology Procedure Note  Procedure:  CT guided aspiration of left retroperitoneal fluid collection   Complications:  None  Estimated Blood Loss:  < 10 mL  Findings:  Recurrent left retroperitoneal fluid collection aspirated via 5 Fr Yueh catheter, yielding 10 mL of cloudy, pink colored fluid.  Not grossly purulent. Sample sent for culture studies.  Venetia Night. Kathlene Cote, M.D Pager:  [phone]

## 2014-11-27 NOTE — Progress Notes (Signed)
Initial Nutrition Assessment  DOCUMENTATION CODES:   Non-severe (moderate) malnutrition in context of chronic illness  INTERVENTION:  - TPN per pharmacy - RD will continue to monitor for needs  NUTRITION DIAGNOSIS:   Inadequate oral intake related to inability to eat as evidenced by NPO status.  GOAL:   Patient will meet greater than or equal to 90% of their needs  MONITOR:   Diet advancement, Weight trends, Labs, I & O's  REASON FOR ASSESSMENT:   Malnutrition Screening Tool, Consult New TPN/TNA  ASSESSMENT:   69 y.o. male with a history of Left Flank Area Liposarcoma S/P Resection and Left Nephrectomy 06/2014 with Radiation Rx completed on 32/9518 complicated by abscess formation (+VRE) who presents to the ED with complaints of ABD bloating x 2 days and nausea and vomiting x 4 today. He denies any hematemesis, fevers or chills. A Ct scan was performed [age] y.o. male with a history of Left Flank Area Liposarcoma S/P Resection and Left Nephrectomy 06/2014 with Radiation Rx completed on 84/1660 complicated by abscess formation (+VRE) who presents to the ED with complaints of ABD bloating x 2 days and nausea and vomiting x 4 today. He denies any hematemesis, fevers or chills. A Ct scan was performed 1 day ago for cancer staging and revealed a high grade partial small bowl obstruction and General surgery was consulted and an NGT was placed. He was referred for medical admission. He is on oral Linezolid and Metronidazole for +VRE For a chronic intra-abdominal abscess. y ago for cancer staging and revealed a high grade partial small bowl obstruction and General surgery was consulted and an NGT was placed. He was referred for medical admission. He is on oral Linezolid and Metronidazole for +VRE For a chronic intra-abdominal abscess.   Pt seen for consult. BMI indicates normal weight status. Pt with NGT placed 8/26 which was clamped at time of RD visit with ~500cc  drainage present in canister. Pt denies nausea, vomiting, or abdominal pain this AM.   He states that since d/c earlier this month he has been eating and drinking without difficulty until about 3-4 days PTA. He states during this time he began having nausea and vomiting with all PO intakes and that last episode of emesis was the day of or day after admission. He states he was informed surgery is planned for Wednesday (8/31).  Per chart review, pt has lost 11 lbs (6% body weight) in the past 1 month which is significant for time frame. Mild muscle and fat wasting also noted.  Order currently in place for Clinimix E 5/15 @ 40 mL/hr with 20% lipids @ 10 mL/hr which provides 48 grams protein, 1162 kcal and was started yesterday at 1800. Per pharmacy note yesterday, plan for goal of Clinimix E 5/15 with 20% lipids @ 10 mL/hr which will provide 120 grams protein, 2184 kcal which will meet protein needs and 95% kcal needs.  Clinimix E 5/20 @ 93 mL/hr with 20% lipids @ 10 mL/hr would better meet needs as it would provide 112 grams protein, 2444 kcal.  Not currently meeting needs. Medications reviewed. Labs reviewed; Na: 134 mmol/L, Ca: 8 mg/dL, Phos: 2.1 mg/dL.  Diet Order:  TPN (CLINIMIX-E) Adult Diet NPO time specified Except for: Sips with Meds  Skin:  Reviewed, no issues  Last BM:  8/28  Height:   Ht Readings from Last 1 Encounters:  11/24/14 5\' 8"  (1.727 m)    Weight:   Wt Readings from Last 1 Encounters:  11/24/14 163 lb (73.936 kg)    Ideal Body Weight:  70 kg (kg)  BMI:  Body mass index is 24.79 kg/(m^2).  Estimated Nutritional Needs:   Kcal:  9323-5573  Protein:  110-120 grams  Fluid:  2.2-2.5 L/day  EDUCATION NEEDS:   No education needs identified at this time     Jarome Matin, RD, LDN Inpatient Clinical Dietitian Pager # [phone] After hours/weekend pager # [phone]

## 2014-11-27 NOTE — Progress Notes (Signed)
Patient ID: Darryl Griffith, male   DOB: September 23, 1945, 69 y.o.   MRN: [id number] Pt's case has been reviewed by Dr. Kathlene Cote today. He is very familiar with pt from prior drainage procedures. At this time he recommends ID consult before proceeding with any form of intervention (asp vs drain). When drains are placed they typically become a chronic/prolonged issue and therefore if anything is done would aspirate collection only (after ID assessment) . Most recent cx revealed rare VRE. Collection does not appear to be responsible for pt's SBO per Dr. Kathlene Cote. Please page him at [phone] with any additional questions.

## 2014-11-27 NOTE — Progress Notes (Signed)
Patient ID: Darryl Griffith, male   DOB: Apr 06, 1945, 69 y.o.   MRN: [id number]     [address] [place], [place] [address]    Phone: [phone] FAX: [phone]     Subjective: Had a small BM.   Objective:  Vital signs:  Filed Vitals:   11/26/14 0542 11/26/14 0826 11/26/14 2111 11/27/14 0653  BP: 143/84 122/73 141/76 143/77  Pulse: 94  85 88  Temp: 98.7 F (37.1 C)  98.3 F (36.8 C) 98.1 F (36.7 C)  TempSrc: Oral  Oral Oral  Resp: _0 Height:      Weight:      SpO2: 97%  98% 98%    Last BM Date: 11/26/14  Intake/Output   Yesterday:  08/28 0701 - 08/29 0700 In: 1361.3 [I.V.:381.3; IV Piggyback:500; TPN:480] Out: 1700 [Urine:1300; Emesis/NG output:400] This shift:  Total I/O In: -  Out: 200 [Urine:200]   Physical Exam: General: Pt awake/alert/oriented x4 in no acute distress Abdomen: Soft. Bilious output.  447m out. No evidence of peritonitis.  No incarcerated hernias.   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   Hypertension   Anemia of chronic disease   Abscess, retroperitoneal   Malnutrition of moderate degree   Hypokalemia    Results:   Labs: Results for orders placed or performed during the hospital encounter of 11/24/14 (from the past 48 hour(s))  Basic metabolic panel     Status: Abnormal   Collection Time: 11/26/14  4:18 AM  Result Value Ref Range   Sodium 135 135 - 145 mmol/L   Potassium 3.3 (L) 3.5 - 5.1 mmol/L   Chloride 104 101 - 111 mmol/L   CO2 25 22 - 32 mmol/L   Glucose, Bld 88 65 - 99 mg/dL   BUN 12 6 - 20 mg/dL   Creatinine, Ser 1.14 0.61 - 1.24 mg/dL   Calcium 8.0 (L) 8.9 - 10.3 mg/dL   GFR calc non Af Amer >60 >60 mL/min   GFR calc Af Amer >60 >60 mL/min    Comment: (NOTE) The  eGFR has been calculated using the CKD EPI equation. This calculation has not been validated in all clinical situations. eGFR's persistently <60 mL/min signify possible Chronic Kidney Disease.    Anion gap 6 5 - 15  Magnesium     Status: None   Collection Time: 11/26/14  4:18 AM  Result Value Ref Range   Magnesium 1.9 1.7 - 2.4 mg/dL  Comprehensive metabolic panel     Status: Abnormal   Collection Time: 11/27/14  4:08 AM  Result Value Ref Range   Sodium 134 (L) 135 - 145 mmol/L   Potassium 3.5 3.5 - 5.1 mmol/L   Chloride 104 101 - 111 mmol/L   CO2 25 22 - 32 mmol/L   Glucose, Bld 125 (H) 65 - 99 mg/dL   BUN 13 6 - 20 mg/dL   Creatinine, Ser 1.03 0.61 - 1.24 mg/dL   Calcium 8.0 (L) 8.9 - 10.3 mg/dL   Total Protein 5.0 (L) 6.5 - 8.1 g/dL   Albumin 2.3 (L) 3.5 - 5.0 g/dL   AST 25 15 - 41 U/L   ALT 17 17 - 63 U/L   Alkaline Phosphatase 51 38 - 126 U/L  Total Bilirubin 0.4 0.3 - 1.2 mg/dL   GFR calc non Af Amer >60 >60 mL/min   GFR calc Af Amer >60 >60 mL/min    Comment: (NOTE) The eGFR has been calculated using the CKD EPI equation. This calculation has not been validated in all clinical situations. eGFR's persistently <60 mL/min signify possible Chronic Kidney Disease.    Anion gap 5 5 - 15  Magnesium     Status: None   Collection Time: 11/27/14  4:08 AM  Result Value Ref Range   Magnesium 1.7 1.7 - 2.4 mg/dL  Phosphorus     Status: Abnormal   Collection Time: 11/27/14  4:08 AM  Result Value Ref Range   Phosphorus 2.1 (L) 2.5 - 4.6 mg/dL  CBC     Status: Abnormal   Collection Time: 11/27/14  4:08 AM  Result Value Ref Range   WBC 4.0 4.0 - 10.5 K/uL   RBC 3.05 (L) 4.22 - 5.81 MIL/uL   Hemoglobin 7.8 (L) 13.0 - 17.0 g/dL   HCT 24.7 (L) 39.0 - 52.0 %   MCV 81.0 78.0 - 100.0 fL   MCH 25.6 (L) 26.0 - 34.0 pg   MCHC 31.6 30.0 - 36.0 g/dL   RDW 17.4 (H) 11.5 - 15.5 %   Platelets 366 150 - 400 K/uL  Differential     Status: Abnormal   Collection Time: 11/27/14  4:08 AM   Result Value Ref Range   Neutrophils Relative % 77 43 - 77 %   Neutro Abs 3.1 1.7 - 7.7 K/uL   Lymphocytes Relative 8 (L) 12 - 46 %   Lymphs Abs 0.3 (L) 0.7 - 4.0 K/uL   Monocytes Relative 13 (H) 3 - 12 %   Monocytes Absolute 0.5 0.1 - 1.0 K/uL   Eosinophils Relative 3 0 - 5 %   Eosinophils Absolute 0.1 0.0 - 0.7 K/uL   Basophils Relative 1 0 - 1 %   Basophils Absolute 0.0 0.0 - 0.1 K/uL  Triglycerides     Status: None   Collection Time: 11/27/14  4:08 AM  Result Value Ref Range   Triglycerides 132 <150 mg/dL    Comment: Performed at [facility]  Prealbumin     Status: Abnormal   Collection Time: 11/27/14  4:08 AM  Result Value Ref Range   Prealbumin 12.1 (L) 18 - 38 mg/dL    Comment: Performed at [facility]     Status: Abnormal   Collection Time: 11/27/14  4:08 AM  Result Value Ref Range   Prothrombin Time 16.1 (H) 11.6 - 15.2 seconds   INR 1.27 0.00 - 1.49  APTT     Status: None   Collection Time: 11/27/14  4:08 AM  Result Value Ref Range   aPTT 33 24 - 37 seconds    Imaging / Studies: Dg Abd 2 Views  11/26/2014   CLINICAL DATA:  Abdominal distention with nausea and vomiting. Surgery in April. Small bowel obstruction symptoms.  EXAM: ABDOMEN - 2 VIEW  COMPARISON:  11/24/2014  FINDINGS: Upright and supine views. Upright view demonstrates persistent small bowel air-fluid levels, increased. Small left pleural effusion. There may be a nasogastric tube looped in the esophagus, incompletely imaged. Surgical clips in the upper abdomen. A metallic object projecting over the a lower chest is presumably external to the patient.  Supine images demonstrate small bowel distension. Example loop measures 6.7 cm today, versus 6.4 cm on the prior. Contrast is identified within the  colon. No pneumatosis.  IMPRESSION: Similar small bowel obstruction pattern, without free intraperitoneal air or other acute complication.  The nasogastric may be looped in the distal  esophagus. Incompletely imaged. These results will be called to the ordering clinician or representative by the Radiologist Assistant, and communication documented in the PACS or zVision Dashboard.   Electronically Signed   By: Darryl Griffith M.D.   On: 11/26/2014 10:07   Dg Abd Portable 1v  11/26/2014   CLINICAL DATA:  Nasogastric tube placement  EXAM: PORTABLE ABDOMEN - 1 VIEW  COMPARISON:  Portable exam 1318 hours compared to 11/26/2014  FINDINGS: Tip of nasogastric tube projects over proximal stomach.  Numerous surgical clips in the perigastric region.  Multiple dilated loops of small bowel again identified compatible with small bowel obstruction.  Small amount of contrast within a decompressed colon.  No definite bowel wall thickening.  Lung bases clear.  IMPRESSION: Small bowel obstruction.  Tip of nasogastric tube projects over proximal stomach.   Electronically Signed   By: Lavonia Dana M.D.   On: 11/26/2014 16:49    Medications / Allergies:  Scheduled Meds: . bisacodyl  10 mg Rectal Daily  . heparin subcutaneous  5,000 Units Subcutaneous 3 times per day  . linezolid  600 mg Intravenous Q12H  . lip balm  1 application Topical BID  . metronidazole  500 mg Intravenous Q8H   Continuous Infusions: . sodium chloride 75 mL/hr at 11/27/14 0700  . Marland KitchenTPN (CLINIMIX-E) Adult 40 mL/hr at 11/26/14 1722   And  . fat emulsion 80 kcal (11/27/14 0555)   PRN Meds:.acetaminophen **OR** acetaminophen, alum & mag hydroxide-simeth, diphenhydrAMINE, HYDROmorphone (DILAUDID) injection, lactated ringers, LORazepam, magic mouthwash, menthol-cetylpyridinium, metoprolol, ondansetron **OR** ondansetron (ZOFRAN) IV, phenol, promethazine, sodium chloride  Antibiotics: Anti-infectives    Start     Dose/Rate Route Frequency Ordered Stop   11/25/14 0000  metroNIDAZOLE (FLAGYL) IVPB 500 mg     500 mg 100 mL/hr over 60 Minutes Intravenous Every 8 hours 11/24/14 2355     11/25/14 0000  linezolid (ZYVOX) IVPB 600 mg      600 mg 300 mL/hr over 60 Minutes Intravenous Every 12 hours 11/24/14 2355          Assessment/Plan left retroperitoneal liposarcoma s/p resection and left nephrectomy 07/13/14 s/p XRT---Dr. Barry Dienes  Post op intra-abdominal abscesses(VRE) Recurrent SBO-NGT, NPO, TPN.  Plan for laparotomy on Wednesday morning with Dr. Barry Dienes ID-linezolid/flagyl for VRE.  IR perc drain today  HTN PCM-prealbumin 12, TPN.  Acute on chronic anemia-h&h 7.8/24.7 Dispo-OR Wednesday    Erby Pian, [facility] Surgery Pager [phone](7A-4:30P)   11/27/2014  9:01 AM

## 2014-11-27 NOTE — Progress Notes (Signed)
PROGRESS NOTE  Darryl Griffith XAJ:[id number] DOB: June 16, 1945 DOA: 11/24/2014 PCP: Gennette Pac, MD   HPI: Darryl Griffith is a 69 y.o. male with a history of Left Flank Area Liposarcoma S/P Resection and Left Nephrectomy 06/2014 with Radiation Rx completed on 11/4707 complicated by abscess formation (+VRE), s/p drain placement for ~ 3 weeks (removed 8/2), was admitted on 8/26 with nausea/vomiting found to have SBO. General surgery was consulted on admission.   Subjective / 24 H Interval events - seen post drainage by IR, a bit drowsy but denies pain, no nausea.  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 - s/p radiation tx - outpatient management  Left retroperitoneal and left upper quadrant fluid collection  - collection appears smaller however not dramatically decreased - last time this was sampled on 8/12 was positive still for VRE - s/p IR drainage 8/29, awaiting cell count / culture data  HTN - prn hydralazine  Hypokalemia - stable  Hypomagnesemia - improved with repletion   Diet: TPN (CLINIMIX-E) Adult Diet NPO time specified Except for: Sips with Meds .TPN (CLINIMIX-E) Adult Fluids: NS DVT Prophylaxis: SCD  Code Status: Full Code Family Communication: no family bedside  Disposition Plan: remain inpatient  Consultants:  General surgery   Procedures:  None    Antibiotics  Anti-infectives    Start      Dose/Rate Route Frequency Ordered Stop   11/25/14 0000  metroNIDAZOLE (FLAGYL) IVPB 500 mg     500 mg 100 mL/hr over 60 Minutes Intravenous Every 8 hours 11/24/14 2355     11/25/14 0000  linezolid (ZYVOX) IVPB 600 mg     600 mg 300 mL/hr over 60 Minutes Intravenous Every 12 hours 11/24/14 2355        Studies Dg Abd 2 Views  11/27/2014   CLINICAL DATA:  Followup small bowel obstruction  EXAM: ABDOMEN - 2 VIEW  COMPARISON:  11/26/2014  FINDINGS: The nasogastric tube is identified with tip in the stomach. The side port is below the GE junction. Dilated loops of small bowel and air-fluid levels are again noted. These measure up to 6.7 cm. Enteric contrast material is identified within the colon up to the level of the rectum.  IMPRESSION: 1. NG tube is within the stomach. 2. Persistent partial small bowel obstruction 3. Enteric contrast material is identified up the level of the rectum.   Electronically Signed   By: Kerby Moors M.D.   On: 11/27/2014 09:35   Dg Abd 2 Views  11/26/2014   CLINICAL DATA:  Abdominal distention with nausea and vomiting. Surgery in April. Small bowel obstruction symptoms.  EXAM: ABDOMEN - 2 VIEW  COMPARISON:  11/24/2014  FINDINGS: Upright and supine views. Upright view demonstrates persistent small bowel air-fluid levels, increased. Small left pleural effusion. There may be a nasogastric tube looped in the esophagus, incompletely imaged. Surgical clips in the upper abdomen. A metallic object projecting over the a lower chest is presumably external to the patient.  Supine images  demonstrate small bowel distension. Example loop measures 6.7 cm today, versus 6.4 cm on the prior. Contrast is identified within the colon. No pneumatosis.  IMPRESSION: Similar small bowel obstruction pattern, without free intraperitoneal air or other acute complication.  The nasogastric may be looped in the distal esophagus. Incompletely imaged. These results will be called to the ordering clinician  or representative by the Radiologist Assistant, and communication documented in the PACS or zVision Dashboard.   Electronically Signed   By: Abigail Miyamoto M.D.   On: 11/26/2014 10:07   Dg Abd Portable 1v  11/26/2014   CLINICAL DATA:  Nasogastric tube placement  EXAM: PORTABLE ABDOMEN - 1 VIEW  COMPARISON:  Portable exam 1318 hours compared to 11/26/2014  FINDINGS: Tip of nasogastric tube projects over proximal stomach.  Numerous surgical clips in the perigastric region.  Multiple dilated loops of small bowel again identified compatible with small bowel obstruction.  Small amount of contrast within a decompressed colon.  No definite bowel wall thickening.  Lung bases clear.  IMPRESSION: Small bowel obstruction.  Tip of nasogastric tube projects over proximal stomach.   Electronically Signed   By: Lavonia Dana M.D.   On: 11/26/2014 16:49   Objective  Filed Vitals:   11/27/14 1201 11/27/14 1205 11/27/14 1211 11/27/14 1248  BP: 143/86 145/88 142/87 135/74  Pulse: 89 91 91 85  Temp:    97.8 F (36.6 C)  TempSrc:    Oral  Resp: 23 15 15 17   Height:      Weight:      SpO2: 98% 97% 97%     Intake/Output Summary (Last 24 hours) at 11/27/14 1329 Last data filed at 11/27/14 0824  Gross per 24 hour  Intake 1361.25 ml  Output   1900 ml  Net -538.75 ml   Filed Weights   11/24/14 2020  Weight: 73.936 kg (163 lb)   Exam:  GENERAL: NAD, NG tube in place  LUNGS: Clear to auscultation. No wheezing or crackles  HEART: Regular rate and rhythm without murmur. 2+ pulses, no JVD, no peripheral edema  ABDOMEN: Soft, nontender, and nondistended. No bowel sounds.   EXTREMITIES: Without any cyanosis, clubbing, rash, lesions or edema.  Data Reviewed: Basic Metabolic Panel:  Recent Labs Lab 11/24/14 2130 11/25/14 0530 11/26/14 0418 11/27/14 0408  NA 132* 132* 135 134*  K 3.2* 3.4* 3.3* 3.5  CL 99* 102 104 104  CO2 20* 23 25 25   GLUCOSE 96 90 88 125*  BUN 12 12 12 13   CREATININE 1.29* 1.14  1.14 1.03  CALCIUM 8.7* 8.4* 8.0* 8.0*  MG  --  1.5* 1.9 1.7  PHOS  --   --   --  2.1*   Liver Function Tests:  Recent Labs Lab 11/24/14 2130 11/27/14 0408  AST 32 25  ALT 21 17  ALKPHOS 63 51  BILITOT 0.3 0.4  PROT 5.7* 5.0*  ALBUMIN 2.5* 2.3*    Recent Labs Lab 11/24/14 2130  LIPASE 47   CBC:  Recent Labs Lab 11/24/14 2130 11/25/14 0530 11/27/14 0408  WBC 5.8 5.3 4.0  NEUTROABS 4.7  --  3.1  HGB 8.6* 9.0* 7.8*  HCT 26.7* 26.5* 24.7*  MCV 80.7 80.1 81.0  PLT 447* 453* 366   Scheduled Meds: . sodium chloride   Intravenous Once  . bisacodyl  10 mg Rectal Daily  . fentaNYL      . heparin subcutaneous  5,000 Units Subcutaneous 3 times per day  . insulin aspart  0-9 Units Subcutaneous 4 times per day  . linezolid  600 mg Intravenous Q12H  . lip balm  1 application Topical BID  . metronidazole  500 mg Intravenous Q8H  . midazolam      . potassium chloride  10 mEq Intravenous Q1 Hr x 2  . potassium phosphate IVPB (mmol)  10 mmol Intravenous Once   Continuous Infusions: . Marland KitchenTPN (CLINIMIX-E) Adult     And  . fat emulsion    . sodium chloride 75 mL/hr at 11/27/14 0700  . sodium chloride    . Marland KitchenTPN (CLINIMIX-E) Adult 40 mL/hr at 11/26/14 1722   And  . fat emulsion 80 kcal (11/27/14 0555)    Marzetta Board, MD [facility] Pager [phone]. If 7 PM - 7 AM, please contact night-coverage at [email or web address], password [email or web address] 11/27/2014, 1:29 PM  LOS: 3 days

## 2014-11-27 NOTE — Consult Note (Signed)
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 The patient is recovering from the effects of radiation, still having weight loss at his last hospitalization. 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 He is being treated for infected localized intraabdominal fluid collections. He has been on treatment for nearly 6 wks, and most recently changed his antibiotics to linezolid and metronidazole due to aspirate + VRE on 8/12. He had repeat imaging on 8/25 that showed smaller fluid collection measuring 5 x 2.3 x 7.6 cm as well as high grade partial SBO with transition point in the lumen near residual fluid collection. He was admitted on 8/26 for N/V/ adbominal pain concerning for recurrent SBO. He underwent repeat aspiration of 52m by IR today. He is slated for ex-lap on 8/31 with Dr. BBarry Dienes  i have reviewed imaging as well as recents visits since his last hospitalization  Past Medical History  Diagnosis Date  . Hypertension   . Anginal pain     hx of CP saw Dr TWynonia Lawman6/23/2015 H&P per chart   . Left Retroperitoneal mass s/p resection/L nephrectomy 07/13/2014   . Postoperative anemia due to acute blood  loss 07/15/2014  . SBO (small bowel obstruction) 08/01/2014, 10/11/14  . Retroperitoneal fluid collection   . 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 Impaired fasting glucose 11/25/2014  . Benign neoplasm of colon 11/25/2014  . Actinic keratosis 11/25/2014  . Mixed hyperlipidemia 11/25/2014    Allergies:  Allergies  Allergen Reactions  . Compazine [Prochlorperazine] Other (See Comments)    Made pt really lethargic, and sleepy     MEDICATIONS: . sodium chloride   Intravenous Once  . bisacodyl  10 mg Rectal Daily  . fentaNYL      . heparin subcutaneous  5,000 Units Subcutaneous 3 times per day  . insulin aspart  0-9 Units Subcutaneous 4 times per day  . linezolid  600 mg Intravenous Q12H  . lip balm  1 application Topical BID  . metronidazole  500 mg Intravenous Q8H  . midazolam      . potassium phosphate IVPB (mmol)  10 mmol Intravenous Once    Social History  Substance Use Topics  . Smoking status: Never Smoker   . Smokeless tobacco: Never Used  . Alcohol Use: No    Family History  Problem Relation Age of Onset  . Adopted: Yes  . Family history unknown: Yes    Review of Systems - Positive pertinents listed in hpi with n/v/ no recent weight gain. 10 point ros is otherwise negative  OBJECTIVE: Temp:  [97.8 F (36.6 C)-98.3 F (36.8 C)] 98.1 F (36.7 C) (08/29 1443) Pulse Rate:  [85-91] 89 (08/29 1443) Resp:  [15-23] 17 (08/29 1443) BP: (134-149)/(74-88) 134/82 mmHg (08/29 1443) SpO2:  [97 %-98 %] 97 % (08/29 1211) Physical Exam  Constitutional: He is oriented to person, place, and time. He appears well-developed and well-nourished. No distress.  HENT: ng in place, to wall suction Mouth/Throat: Oropharynx is clear and moist. No oropharyngeal exudate.   Abdominal: Soft. Bowel sounds are normal. He exhibits no distension. There is no tenderness.  Neurological: He is alert and oriented to  person, place, and time.  Skin: Skin is warm and dry. No rash noted. No erythema.  Psychiatric: He has a normal mood and affect. His behavior is normal.     LABS: Results for orders placed or performed during the hospital encounter of 11/24/14 (from the past 48 hour(s))  Basic metabolic panel     Status: Abnormal   Collection Time: 11/26/14  4:18 AM  Result Value Ref Range   Sodium 135 135 - 145 mmol/L   Potassium 3.3 (L) 3.5 - 5.1 mmol/L   Chloride 104 101 - 111 mmol/L   CO2 25 22 - 32 mmol/L   Glucose, Bld 88 65 - 99 mg/dL   BUN 12 6 - 20 mg/dL   Creatinine, Ser 1.14 0.61 - 1.24 mg/dL   Calcium 8.0 (L) 8.9 - 10.3 mg/dL   GFR calc non Af Amer >60 >60 mL/min   GFR calc Af Amer >60 >60 mL/min    Comment: (NOTE) The eGFR has been calculated using the CKD EPI equation. This calculation has not been validated in all clinical situations. eGFR's persistently <60 mL/min signify possible Chronic Kidney Disease.    Anion gap 6 5 - 15  Magnesium     Status: None   Collection Time: 11/26/14  4:18 AM  Result Value Ref Range   Magnesium 1.9 1.7 - 2.4 mg/dL  Comprehensive metabolic panel     Status: Abnormal   Collection Time: 11/27/14  4:08 AM  Result Value Ref Range   Sodium 134 (L) 135 - 145 mmol/L   Potassium 3.5 3.5 - 5.1 mmol/L   Chloride 104 101 - 111 mmol/L   CO2 25 22 - 32 mmol/L   Glucose, Bld 125 (H) 65 - 99 mg/dL   BUN 13 6 - 20 mg/dL   Creatinine, Ser 1.03 0.61 - 1.24 mg/dL   Calcium 8.0 (L) 8.9 - 10.3 mg/dL   Total Protein 5.0 (L) 6.5 - 8.1 g/dL   Albumin 2.3 (L) 3.5 - 5.0 g/dL   AST 25 15 - 41 U/L   ALT 17 17 - 63 U/L   Alkaline Phosphatase 51 38 - 126 U/L   Total Bilirubin 0.4 0.3 - 1.2 mg/dL   GFR calc non Af Amer >60 >60 mL/min   GFR calc Af Amer >60 >60 mL/min    Comment: (NOTE) The eGFR has been calculated using the CKD EPI equation. This calculation has not been validated in all clinical situations. eGFR's persistently <60 mL/min signify possible Chronic  Kidney Disease.    Anion gap 5 5 - 15  Magnesium     Status: None   Collection Time: 11/27/14  4:08 AM  Result Value Ref Range   Magnesium 1.7 1.7 - 2.4 mg/dL  Phosphorus     Status: Abnormal   Collection Time: 11/27/14  4:08 AM  Result Value Ref Range   Phosphorus 2.1 (  L) 2.5 - 4.6 mg/dL  CBC     Status: Abnormal   Collection Time: 11/27/14  4:08 AM  Result Value Ref Range   WBC 4.0 4.0 - 10.5 K/uL   RBC 3.05 (L) 4.22 - 5.81 MIL/uL   Hemoglobin 7.8 (L) 13.0 - 17.0 g/dL   HCT 24.7 (L) 39.0 - 52.0 %   MCV 81.0 78.0 - 100.0 fL   MCH 25.6 (L) 26.0 - 34.0 pg   MCHC 31.6 30.0 - 36.0 g/dL   RDW 17.4 (H) 11.5 - 15.5 %   Platelets 366 150 - 400 K/uL  Differential     Status: Abnormal   Collection Time: 11/27/14  4:08 AM  Result Value Ref Range   Neutrophils Relative % 77 43 - 77 %   Neutro Abs 3.1 1.7 - 7.7 K/uL   Lymphocytes Relative 8 (L) 12 - 46 %   Lymphs Abs 0.3 (L) 0.7 - 4.0 K/uL   Monocytes Relative 13 (H) 3 - 12 %   Monocytes Absolute 0.5 0.1 - 1.0 K/uL   Eosinophils Relative 3 0 - 5 %   Eosinophils Absolute 0.1 0.0 - 0.7 K/uL   Basophils Relative 1 0 - 1 %   Basophils Absolute 0.0 0.0 - 0.1 K/uL  Triglycerides     Status: None   Collection Time: 11/27/14  4:08 AM  Result Value Ref Range   Triglycerides 132 <150 mg/dL    Comment: Performed at [facility]  Prealbumin     Status: Abnormal   Collection Time: 11/27/14  4:08 AM  Result Value Ref Range   Prealbumin 12.1 (L) 18 - 38 mg/dL    Comment: Performed at [facility]     Status: Abnormal   Collection Time: 11/27/14  4:08 AM  Result Value Ref Range   Prothrombin Time 16.1 (H) 11.6 - 15.2 seconds   INR 1.27 0.00 - 1.49  APTT     Status: None   Collection Time: 11/27/14  4:08 AM  Result Value Ref Range   aPTT 33 24 - 37 seconds  Glucose, capillary     Status: Abnormal   Collection Time: 11/27/14 12:39 PM  Result Value Ref Range   Glucose-Capillary 114 (H) 65 - 99 mg/dL   Comment 1  Notify RN     MICRO: 8/29 retroperitoneal fluid collection aspirate = pending IMAGING:  Dg Abd 2 Views  11/27/2014   CLINICAL DATA:  Followup small bowel obstruction  EXAM: ABDOMEN - 2 VIEW  COMPARISON:  11/26/2014  FINDINGS: The nasogastric tube is identified with tip in the stomach. The side port is below the GE junction. Dilated loops of small bowel and air-fluid levels are again noted. These measure up to 6.7 cm. Enteric contrast material is identified within the colon up to the level of the rectum.  IMPRESSION: 1. NG tube is within the stomach. 2. Persistent partial small bowel obstruction 3. Enteric contrast material is identified up the level of the rectum.   Electronically Signed   By: Kerby Moors M.D.   On: 11/27/2014 09:35   Dg Abd 2 Views  11/26/2014   CLINICAL DATA:  Abdominal distention with nausea and vomiting. Surgery in April. Small bowel obstruction symptoms.  EXAM: ABDOMEN - 2 VIEW  COMPARISON:  11/24/2014  FINDINGS: Upright and supine views. Upright view demonstrates persistent small bowel air-fluid levels, increased. Small left pleural effusion. There may be a nasogastric tube looped in the esophagus, incompletely imaged. Surgical clips  in the upper abdomen. A metallic object projecting over the a lower chest is presumably external to the patient.  Supine images demonstrate small bowel distension. Example loop measures 6.7 cm today, versus 6.4 cm on the prior. Contrast is identified within the colon. No pneumatosis.  IMPRESSION: Similar small bowel obstruction pattern, without free intraperitoneal air or other acute complication.  The nasogastric may be looped in the distal esophagus. Incompletely imaged. These results will be called to the ordering clinician or representative by the Radiologist Assistant, and communication documented in the PACS or zVision Dashboard.   Electronically Signed   By: Abigail Miyamoto M.D.   On: 11/26/2014 10:07   Dg Abd Portable 1v  11/26/2014   CLINICAL  DATA:  Nasogastric tube placement  EXAM: PORTABLE ABDOMEN - 1 VIEW  COMPARISON:  Portable exam 1318 hours compared to 11/26/2014  FINDINGS: Tip of nasogastric tube projects over proximal stomach.  Numerous surgical clips in the perigastric region.  Multiple dilated loops of small bowel again identified compatible with small bowel obstruction.  Small amount of contrast within a decompressed colon.  No definite bowel wall thickening.  Lung bases clear.  IMPRESSION: Small bowel obstruction.  Tip of nasogastric tube projects over proximal stomach.   Electronically Signed   By: Lavonia Dana M.D.   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 Admitted for SBO. Repeat imaging shows smaller fluid collection. Underwent repeat aspiration  - continue with current antibiotic regimen of linezolid and metronidazole to treat VRE retroperitoneal infection. Plan is to continue for addn 3 wk at minimum, will depend on repeat imaging  - for upcoming ex-lap, would still use pre-op antibiotics with cefazolin    Caren Griffins B. [facility] [phone]

## 2014-11-27 NOTE — Progress Notes (Signed)
Darryl Griffith  07-03-1945 [id number]  Patient Care Team: [name], MD as PCP - General (Family Medicine) Stark Klein, MD as Consulting Physician (General Surgery) Wilford Corner, MD as Consulting Physician (Gastroenterology) Daryll Brod, MD as Consulting Physician (Orthopedic Surgery) Campbell Riches, MD as Consulting Physician (Infectious Diseases)  ID called per VIR request - consult pending  D/w Dr Arty Baumgartner - try to sample fluid - aspiration to see if VRE or other issues persist  Moost likely ex lap / LOA/ poss SB resection if not better in 48hrs  Patient Active Problem List   Diagnosis Date Noted  . 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 Abscess, retroperitoneal     Priority: Medium  . Hypokalemia 11/25/2014  . Spinal stenosis 11/25/2014  . Essential (primary) hypertension 11/25/2014  . Mixed hyperlipidemia 11/25/2014  . Malnutrition of moderate degree 11/08/2014  . Recurrent SBO (small bowel obstruction) 11/07/2014  . Melena 09/18/2014  . Hyponatremia 09/18/2014  . Hyperglycemia 09/18/2014  . Hypoalbuminemia 09/18/2014  . Anemia of chronic disease 09/18/2014  . Hypertension     Past Medical History  Diagnosis Date  . Hypertension   . Anginal pain     hx of CP saw Dr Wynonia Lawman 09/20/2013 H&P per chart   . Left Retroperitoneal mass s/p resection/L nephrectomy 07/13/2014   . Postoperative anemia due to acute blood loss 07/15/2014  . SBO (small bowel obstruction) 08/01/2014, 10/11/14  . Retroperitoneal fluid collection   . 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 Impaired fasting glucose 11/25/2014  . Benign neoplasm of colon 11/25/2014  . Actinic keratosis 11/25/2014  . Mixed hyperlipidemia 11/25/2014    Past Surgical History  Procedure Laterality Date  . Surgery for arm fracture Right  1952  . Knee arthroscopy Right 1970's  . Colonoscopy with propofol N/A 10/20/2013    Procedure: COLONOSCOPY WITH PROPOFOL;  Surgeon: Lear Ng, MD;  Location: [facility];  Service: Endoscopy;  Laterality: N/A;  . Hot hemostasis N/A 10/20/2013    Procedure: HOT HEMOSTASIS (ARGON PLASMA COAGULATION/BICAP);  Surgeon: Lear Ng, MD;  Location: [facility];  Service: Endoscopy;  Laterality: N/A;  . Tigger finger release      per H&P per Dr Wynonia Lawman 09/20/2013 (on chart)  . Resection of retroperitoneal mass Left 07/13/2014    Procedure: RESECTION OF left RETROPERITONEAL MASS;  Surgeon: Stark Klein, MD;  Location: [facility];  Service: General;  Laterality: Left;  . Esophagogastroduodenoscopy (egd) with propofol N/A 09/19/2014    Procedure: ESOPHAGOGASTRODUODENOSCOPY (EGD) WITH PROPOFOL;  Surgeon: Wilford Corner, MD;  Location: [facility];  Service: Endoscopy;  Laterality: N/A;    Social History   Social History  . Marital Status: Married    Spouse Name: [name]  . Number of Children: 4  . Years of Education: N/A   Occupational History  . Retired Armed forces operational officer    Social History Main Topics  . Smoking status: Never Smoker   . Smokeless tobacco: Never Used  . Alcohol Use: No  . Drug Use: No  . Sexual Activity: Not Currently   Other Topics Concern  . Not on file   Social History Narrative   Married.  Lives in [place] with wife.    Family History  Problem Relation Age of Onset  . Adopted: Yes  . Family history unknown: Yes  Current Facility-Administered Medications  Medication Dose Route Frequency Provider Last Rate Last Dose  . 0.9 %  sodium chloride infusion   Intravenous Continuous Minda Ditto, RPH 75 mL/hr at 11/27/14 0700    . 0.9 %  sodium chloride infusion   Intravenous Once Stark Klein, MD      . acetaminophen (TYLENOL) tablet 650 mg  650 mg Oral Q6H PRN Theressa Millard, MD       Or  . acetaminophen (TYLENOL) suppository 650 mg  650 mg  Rectal Q6H PRN Theressa Millard, MD      . alum & mag hydroxide-simeth (MAALOX/MYLANTA) 200-200-20 MG/5ML suspension 30 mL  30 mL Oral Q6H PRN Michael Boston, MD      . bisacodyl (DULCOLAX) suppository 10 mg  10 mg Rectal Daily Michael Boston, MD   10 mg at 11/25/14 1840  . diphenhydrAMINE (BENADRYL) injection 12.5-25 mg  12.5-25 mg Intravenous Q6H PRN Michael Boston, MD   25 mg at 11/27/14 0109  . TPN (CLINIMIX-E) Adult   Intravenous Continuous TPN Minda Ditto, RPH 40 mL/hr at 11/26/14 1722     And  . fat emulsion 20 % infusion 240 mL  240 mL Intravenous Continuous TPN Minda Ditto, RPH 10 mL/hr at 11/27/14 0555 80 kcal at 11/27/14 0555  . heparin injection 5,000 Units  5,000 Units Subcutaneous 3 times per day Alphonsa Overall, MD   5,000 Units at 11/27/14 0554  . HYDROmorphone (DILAUDID) injection 0.5-1 mg  0.5-1 mg Intravenous Q3H PRN Theressa Millard, MD      . insulin aspart (novoLOG) injection 0-9 Units  0-9 Units Subcutaneous 4 times per day Anh P Pham, RPH      . lactated ringers bolus 1,000 mL  1,000 mL Intravenous Q8H PRN Michael Boston, MD      . linezolid (ZYVOX) IVPB 600 mg  600 mg Intravenous Q12H Theressa Millard, MD 300 mL/hr at 11/27/14 0111 600 mg at 11/27/14 0111  . lip balm (CARMEX) ointment 1 application  1 application Topical BID Michael Boston, MD   1 application at 16/10/96 [phone]  . LORazepam (ATIVAN) injection 0.5 mg  0.5 mg Intravenous QHS PRN Caren Griffins, MD   0.5 mg at 11/26/14 2305  . magic mouthwash  15 mL Oral QID PRN Michael Boston, MD      . menthol-cetylpyridinium (CEPACOL) lozenge 3 mg  1 lozenge Oral PRN Michael Boston, MD      . metoprolol (LOPRESSOR) injection 5 mg  5 mg Intravenous Q6H PRN Michael Boston, MD      . metroNIDAZOLE (FLAGYL) IVPB 500 mg  500 mg Intravenous Q8H Theressa Millard, MD   500 mg at 11/27/14 0113  . ondansetron (ZOFRAN) tablet 4 mg  4 mg Oral Q6H PRN Theressa Millard, MD       Or  . ondansetron (ZOFRAN) injection 4 mg  4 mg Intravenous  Q6H PRN Theressa Millard, MD   4 mg at 11/26/14 1629  . phenol (CHLORASEPTIC) mouth spray 1 spray  1 spray Mouth/Throat PRN Caren Griffins, MD   1 spray at 11/25/14 1154  . potassium chloride 10 mEq in 100 mL IVPB  10 mEq Intravenous Q1 Hr x 2 Anh P Pham, RPH      . potassium phosphate 10 mmol in dextrose 5 % 250 mL infusion  10 mmol Intravenous Once [employer], RPH      . promethazine (PHENERGAN) injection 6.25-12.5 mg  6.25-12.5 mg  Intravenous Q4H PRN Michael Boston, MD      . sodium chloride 0.9 % injection 10-40 mL  10-40 mL Intracatheter PRN Md Ccs, MD   10 mL at 11/27/14 0409     Allergies  Allergen Reactions  . Compazine [Prochlorperazine] Other (See Comments)    Made pt really lethargic, and sleepy    BP 143/77 mmHg  Pulse 88  Temp(Src) 98.1 F (36.7 C) (Oral)  Resp 17  Ht 5\' 8"  (1.727 m)  Wt 73.936 kg (163 lb)  BMI 24.79 kg/m2  SpO2 98%  Ct Chest W Contrast  11/23/2014   CLINICAL DATA:  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  CONTRAST:  169mL OMNIPAQUE IOHEXOL 300 MG/ML  SOLN  COMPARISON:  11/07/2014  FINDINGS: CT CHEST FINDINGS  Thoracic inlet: No neck base or axillary masses or adenopathy. Normal thyroid.  Mediastinum and hila: Heart normal in size and configuration. Mild prominence of the ascending aorta measuring 4 cm in diameter. 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 Right PICC tip projects in the lower superior vena cava. No mediastinal or hilar masses or pathologically enlarged lymph nodes.  Lungs and  pleura: No lung masses or suspicious nodules. Small left pleural effusion associated with mild dependent left lower lobe atelectasis. Small focus of reticular nodular opacity in the posterior right lower lobe is likely additional atelectasis or scarring. There is no lung consolidation or edema. No pneumothorax.  CT ABDOMEN AND PELVIS FINDINGS  There is a fluid collection that extends from the posterior medial left superior retroperitoneum, adjacent to the spleen, to the lateral margin of the superior left psoas muscle. This measures 4.9 x 2.2 cm transversely, previously 5.6 x 2.6 cm. It measures 7.6 cm an approximate superior inferior length, previously 9.2 cm. Fluid attenuation and multiple blastic and clips lie in the left upper quadrant between the posterior wall of the stomach and upper abdominal aorta. There is thickening of this portion of the stomach wall. These changes partly surround the pancreatic tail and surround the left diaphragmatic crus. No left adrenal gland is visible. Left kidney is surgically absent.  Liver and spleen:  Unremarkable.  Gallbladder and biliary tree: Unremarkable.  Pancreas: There is some heterogeneous lower attenuation along the pancreatic tail adjacent to the left upper quadrant surgical changes and low-attenuation soft tissue. This is similar to the prior exam, likely reactive edema. No other pancreatic abnormality.  Right adrenal glands: Normal.  Right kidney, ureter, bladder: 15 mm midpole cyst. No other right renal masses, no stones and no hydronephrosis. Normal ureters. Unremarkable bladder.  Lymph nodes:  No pathologically enlarged lymph nodes.  Ascites:  Trace ascites in the pelvis.  Gastrointestinal: There is a partial small bowel obstruction. The transition point is in the left upper adjacent to the postsurgical changes and residual left retroperitoneal fluid collection. Small bowel proximal to the transition is dilated to a maximum of 5.3 cm. Small bowel distal to this  is decompressed. The colon is partly decompressed. There are scattered colonic diverticula mostly along the sigmoid colon. No diverticulitis. Normal-sized appendix is visualized. No bowel wall thickening or inflammatory  changes. No free air.  Abdominal wall: There is edema in the subcutaneous fat mostly along the flanks. There is midline abdominal incision. No hernias.  Vascular: Atherosclerotic changes are noted along the infrarenal abdominal aorta and branch vessels. No aneurysm.  MUSCULOSKELETAL FINDINGS  No osteoblastic or osteolytic lesions. Degenerative changes noted of the visualized spine. Bones are demineralized.  IMPRESSION: 1. Residual left retroperitoneal and left upper quadrant collection associated with postsurgical changes. The defined residual collection, along the posterior medial left retroperitoneum abutting the spleen and left psoas muscle, is smaller, now measuring 4.9 x 2.3 x 7.6 cm, previously 5.6 x 2.6 x 9.2 cm. Left upper quadrant postsurgical and inflammatory changes include thickening of the posterior wall of the stomach, stable. There are numerous vascular clips in the left retroperitoneum and left posterior medial upper quadrant. 2. Fairly high-grade partial small bowel obstruction with the transition point in the posterior medial left upper quadrant adjacent to the residual collection and postsurgical changes. This is similar to prior study. 3. No convincing local residual retroperitoneal sarcoma. 4. No evidence of metastatic disease from the sarcoma.   Electronically Signed   By: Lajean Manes M.D.   On: 11/23/2014 14:36   Ct Abdomen Pelvis W Contrast  11/23/2014   CLINICAL DATA:  Malignant retroperitoneal tumor C48.0 (ICD-10-CM)  Retroperitoneal sarcoma dx;d 1/16 with left nephrectomy & XRT completed 7/16, left pleural effusion, Prominent lt proximal clavicular area x 1 week  EXAM: CT CHEST, ABDOMEN, AND PELVIS WITH CONTRAST  TECHNIQUE: Multidetector CT imaging of the chest,  abdomen and pelvis was performed following the standard protocol during bolus administration of intravenous contrast.  CONTRAST:  148mL OMNIPAQUE IOHEXOL 300 MG/ML  SOLN  COMPARISON:  11/07/2014  FINDINGS: CT CHEST FINDINGS  Thoracic inlet: No neck base or axillary masses or adenopathy. Normal thyroid.  Mediastinum and hila: Heart normal in size and configuration. Mild prominence of the ascending aorta measuring 4 cm in diameter. 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 Right PICC tip projects in the lower superior vena cava. No mediastinal or hilar masses or pathologically enlarged lymph nodes.  Lungs and pleura: No lung masses or suspicious nodules. Small left pleural effusion associated with mild dependent left lower lobe atelectasis. Small focus of reticular nodular opacity in the posterior right lower lobe is likely additional atelectasis or scarring. There is no lung consolidation or edema. No pneumothorax.  CT ABDOMEN AND PELVIS FINDINGS  There is a fluid collection that extends from the posterior medial left superior retroperitoneum, adjacent to the spleen, to the lateral margin of the superior left psoas muscle. This measures 4.9 x 2.2 cm transversely, previously 5.6 x 2.6 cm. It measures 7.6 cm an approximate superior inferior length, previously 9.2 cm. Fluid attenuation and multiple blastic and clips lie in the left upper quadrant between the posterior wall of the stomach and upper abdominal aorta. There is thickening of this portion of the stomach wall. These changes partly surround the pancreatic tail and surround the left diaphragmatic crus. No left adrenal gland is visible. Left kidney is surgically absent.  Liver and spleen:  Unremarkable.  Gallbladder and biliary tree: Unremarkable.  Pancreas: There is some heterogeneous lower attenuation along the pancreatic tail adjacent to the left upper quadrant  surgical changes and low-attenuation soft tissue. This is similar to the prior exam, likely reactive edema. No other pancreatic abnormality.  Right adrenal glands: Normal.  Right kidney, ureter, bladder:  15 mm midpole cyst. No other right renal masses, no stones and no hydronephrosis. Normal ureters. Unremarkable bladder.  Lymph nodes:  No pathologically enlarged lymph nodes.  Ascites:  Trace ascites in the pelvis.  Gastrointestinal: There is a partial small bowel obstruction. The transition point is in the left upper adjacent to the postsurgical changes and residual left retroperitoneal fluid collection. Small bowel proximal to the transition is dilated to a maximum of 5.3 cm. Small bowel distal to this is decompressed. The colon is partly decompressed. There are scattered colonic diverticula mostly along the sigmoid colon. No diverticulitis. Normal-sized appendix is visualized. No bowel wall thickening or inflammatory changes. No free air.  Abdominal wall: There is edema in the subcutaneous fat mostly along the flanks. There is midline abdominal incision. No hernias.  Vascular: Atherosclerotic changes are noted along the infrarenal abdominal aorta and branch vessels. No aneurysm.  MUSCULOSKELETAL FINDINGS  No osteoblastic or osteolytic lesions. Degenerative changes noted of the visualized spine. Bones are demineralized.  IMPRESSION: 1. Residual left retroperitoneal and left upper quadrant collection associated with postsurgical changes. The defined residual collection, along the posterior medial left retroperitoneum abutting the spleen and left psoas muscle, is smaller, now measuring 4.9 x 2.3 x 7.6 cm, previously 5.6 x 2.6 x 9.2 cm. Left upper quadrant postsurgical and inflammatory changes include thickening of the posterior wall of the stomach, stable. There are numerous vascular clips in the left retroperitoneum and left posterior medial upper quadrant. 2. Fairly high-grade partial small bowel obstruction with  the transition point in the posterior medial left upper quadrant adjacent to the residual collection and postsurgical changes. This is similar to prior study. 3. No convincing local residual retroperitoneal sarcoma. 4. No evidence of metastatic disease from the sarcoma.   Electronically Signed   By: Lajean Manes M.D.   On: 11/23/2014 14:36   Ct Abdomen Pelvis W Contrast  11/07/2014   CLINICAL DATA:  Acute epigastric abdominal pain, vomiting.  EXAM: CT ABDOMEN AND PELVIS WITH CONTRAST  TECHNIQUE: Multidetector CT imaging of the abdomen and pelvis was performed using the standard protocol following bolus administration of intravenous contrast.  CONTRAST:  85mL OMNIPAQUE IOHEXOL 300 MG/ML SOLN, 135mL OMNIPAQUE IOHEXOL 300 MG/ML SOLN  COMPARISON:  CT scan of October 31, 2014.  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 Minimal left posterior basilar subsegmental atelectasis is noted.  No gallstones are noted. The liver and pancreas appear normal. Spleen is unchanged. Left retroperitoneal drainage catheter noted on prior exam has been removed. The fluid collection appears to have significantly enlarged compared to prior exam, now measuring 5.6 x 2.6 cm. This is consistent with worsening abscess. Surgical clips are noted in the left retroperitoneal region which are unchanged compared to prior exam. Status post left nephrectomy. Right adrenal gland and kidney appear normal. Left adrenal gland is not clearly visualized. Stable 3.3 x 2.7 cm lesser sac fluid collection is noted. 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 There is significantly increased small bowel dilatation with transition zone seen in the left upper quadrant in area of previously described surgery, best seen on image number 37 of series 2. More distal small bowel is normal in caliber. Sigmoid diverticulosis is noted without inflammation. Urinary bladder appears normal. No  significant adenopathy is noted.  IMPRESSION: Extensive postsurgical changes are noted in left upper quadrant consistent with prior left nephrectomy. There is significantly increased proximal small bowel dilatation with transition zone seen in the  left upper quadrant adjacent to postoperative changes, consistent with small bowel obstruction, most likely due to adhesion or scarring.  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  Left retroperitoneal drainage catheter noted on prior exam has been removed in the interval. Significantly increased fluid collection containing gas is seen in this area consistent with abscess.  Stable abscess or fluid collection is seen in the lesser sac.   Electronically Signed   By: Marijo Conception, M.D.   On: 11/07/2014 19:37   Ct Abdomen Pelvis W Contrast  10/31/2014   CLINICAL DATA:  Follow-up retroperitoneal abscess. 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  EXAM: CT ABDOMEN AND PELVIS WITH CONTRAST  TECHNIQUE: Multidetector CT imaging of the abdomen and pelvis was performed using the standard protocol following bolus administration of intravenous contrast.  CONTRAST:  161mL ISOVUE-300 IOPAMIDOL (ISOVUE-300) INJECTION 61%  COMPARISON:  10/19/2014  FINDINGS: There is a small residual left pleural effusion, decreased from prior. Mild left basilar atelectasis is noted.  Subcentimeter low-density lesions in the right hepatic dome and near the gallbladder fossa are unchanged and too small to characterize. The gallbladder, right adrenal gland, and pancreas are unremarkable. 1.2 cm low-density lesion in the right kidney is unchanged and compatible with a cyst. The left adrenal gland is not identified. Sequelae of left nephrectomy are again identified.  A left-sided retroperitoneal pigtail drainage catheter remains in place. A small amount of fluid and gas remain in the left upper quadrant adjacent to the drainage  catheter and along the medial aspect of the spleen, similar to the prior study. Small abscess immediately inferior to the drainage catheter in the left psoas muscle measures approximately 19 x 11 mm, stable to minimally larger than on the prior study (series 3, image 34). Retroperitoneal soft tissue thickening/phlegmon extends anteriorly along the left aspect of the aorta and posterior to the pancreas, similar to prior.  Multiple small rim enhancing abscesses situated between the spleen and stomach do not appear significantly changed (series 3, image 19). Residual dominant fluid collection in the lesser sac measures 3.6 x 2.9 cm, unchanged (series 3, image 24). No new fluid collection is identified.  Small bowel dilatation on the prior study has largely resolved, without current evidence of high-grade bowel obstruction. A loop of small bowel remains borderline dilated and mildly thick walled in the left upper quadrant with a transition to decompressed small bowel near the drainage catheter. A few small bowel loops in the right mid abdomen remain borderline dilated measuring up to 3 cm in size, improved from prior.  There is mild wall thickening versus underdistention of multiple small bowel loops in the lower abdomen/ pelvis. There is persistent, mild to moderate wall thickening involving a portion of the distal left transverse colon which is inseparable to the 3.6 cm abscess described above. There is also some persistent wall thickening of the proximal descending colon which is in close proximity to the abscess drain. Sigmoid colonic diverticulosis is again seen with persistent, mild pericolonic stranding in this region, not significantly changed.  Moderate aortoiliac atherosclerotic calcification is noted. Bladder is unremarkable. There is at most trace free fluid in the pelvis. Trace perihepatic fluid on the prior study has resolved. Moderate to severe lower lumbar disc degeneration and facet arthrosis are noted.   IMPRESSION: 1. Unchanged 3.6 cm dominant lesser sac abscess with unchanged appearance of adjacent smaller abscesses. 2. Small amount of fluid and gas near the retroperitoneal drainage catheter, similar  to prior. 1.9 cm left psoas abscess is stable to at most minimally larger. 3. Improved/resolved small bowel obstruction. A few borderline dilated loops remain, and persistent partial obstruction is not excluded. 4. Persistent wall thickening of distal transverse and proximal descending colon near the abscesses. 5. No new collection.   Electronically Signed   By: Logan Bores   On: 10/31/2014 16:29   Ct Aspiration  11/10/2014   CLINICAL DATA:  History of recurrent left retroperitoneal fluid collection status post surgical excision of a retroperitoneal liposarcoma. Most recently, a drainage catheter was placed on 08/01/2014 and remained in place for 3 months with removal of the drain on 10/31/2014. Imaging on 11/07/2014 for abdominal pain demonstrated some reaccumulation of fluid in the previously drained medial left retroperitoneal space. Aspiration under CT guidance is now performed to determine if the fluid is infected.  EXAM: CT GUIDED ASPIRATION OF LEFT RETROPERITONEAL FLUID COLLECTION  ANESTHESIA/SEDATION: 3.0 Mg IV Versed 75 mcg IV Fentanyl  Total Moderate Sedation Time:  9 minutes.  PROCEDURE: The procedure, risks, benefits, and alternatives were explained to the patient. Questions regarding the procedure were encouraged and answered. The patient understands and consents to the procedure. A time-out was performed prior to the procedure.  The left flank region was prepped with Betadine in a sterile fashion, and a sterile drape was applied covering the operative field. A sterile gown and sterile gloves were used for the procedure. Local anesthesia was provided with 1% Lidocaine.  CT was performed in a prone position. Under CT guidance, a 5 [place] Yueh centesis catheter was advanced over a 19 gauge needle into  the left retroperitoneal fluid collection. Aspiration was performed through the [facility] catheter. The aspirated fluid sample was sent for culture analysis including a aerobic culture, anaerobic culture and fungal culture. Additional saline lavage was performed through the catheter with 10 mL of sterile saline. CT was performed after catheter removal.  COMPLICATIONS: None  FINDINGS: Aspiration yielded turbid yellow fluid. A total of 20 mL of fluid was able to be aspirated from the collection. After aspiration and additional saline lavage, fluid cavity size was smaller with a small amount of fluid remaining.  IMPRESSION: CT-guided aspiration and drainage of recurrent left retroperitoneal fluid collection. A 20 mL volume of turbid yellow fluid was able to be removed and was sent for culture analysis. Given chronicity of the collection and the presence of a drain in the collection for the last 3 months, aspiration only was performed to determine if the collection is infected prior to committing the patient to potential additional long-term catheter drainage or surgery.   Electronically Signed   By: Aletta Edouard M.D.   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  EXAM: ABSCESS INJECTION  Date: 10/31/2014  PROCEDURE: 1. Catheter injection under fluoroscopy Interventional Radiologist:  Criselda Peaches, MD  ANESTHESIA/SEDATION: None required  MEDICATIONS: None  FLUOROSCOPY TIME:  0 minutes 36 seconds  59 dGy  CONTRAST:  10 mL Omnipaque 350  TECHNIQUE: An injection of contrast material under real-time fluoroscopic guidance demonstrates a collapsed abscess cavity about the drainage catheter. There is reflux of contrast material along the tube to the skin surface. No evidence of fistulization to adjacent large or small bowel.  COMPLICATIONS: None  IMPRESSION: 1. No evidence of  fistulization. 2. Minimal residual collapsed abscess cavity about the drainage catheter. Injected contrast  material refluxes along the tube to the skin surface. 3. The tube was removed.  Signed,  Criselda Peaches, MD  [facility]  [facility]   Electronically Signed   By: Jacqulynn Cadet M.D.   On: 10/31/2014 17:17   Dg Abd 2 Views  11/27/2014   CLINICAL DATA:  Followup small bowel obstruction  EXAM: ABDOMEN - 2 VIEW  COMPARISON:  11/26/2014  FINDINGS: The nasogastric tube is identified with tip in the stomach. The side port is below the GE junction. Dilated loops of small bowel and air-fluid levels are again noted. These measure up to 6.7 cm. Enteric contrast material is identified within the colon up to the level of the rectum.  IMPRESSION: 1. NG tube is within the stomach. 2. Persistent partial small bowel obstruction 3. Enteric contrast material is identified up the level of the rectum.   Electronically Signed   By: Kerby Moors M.D.   On: 11/27/2014 09:35   Dg Abd 2 Views  11/26/2014   CLINICAL DATA:  Abdominal distention with nausea and vomiting. Surgery in April. Small bowel obstruction symptoms.  EXAM: ABDOMEN - 2 VIEW  COMPARISON:  11/24/2014  FINDINGS: Upright and supine views. Upright view demonstrates persistent small bowel air-fluid levels, increased. Small left pleural effusion. There may be a nasogastric tube looped in the esophagus, incompletely imaged. Surgical clips in the upper abdomen. A metallic object projecting over the a lower chest is presumably external to the patient.  Supine images demonstrate small bowel distension. Example loop measures 6.7 cm today, versus 6.4 cm on the prior. Contrast is identified within the colon. No pneumatosis.  IMPRESSION: Similar small bowel obstruction pattern, without free intraperitoneal air or other acute complication.  The nasogastric may be looped in the distal esophagus. Incompletely imaged.  These results will be called to the ordering clinician or representative by the Radiologist Assistant, and communication documented in the PACS or zVision Dashboard.   Electronically Signed   By: Abigail Miyamoto M.D.   On: 11/26/2014 10:07   Dg Abd Acute W/chest  11/24/2014   CLINICAL DATA:  Bloating and nausea since yesterday. Vomiting today. History of bowel obstructions. History of retroperitoneal liposarcoma.  EXAM: DG ABDOMEN ACUTE W/ 1V CHEST  COMPARISON:  CT abdomen and pelvis 11/23/2014  FINDINGS: Right PICC catheter tip over the low SVC region. No pneumothorax. Linear fibrosis or atelectasis in the left lung base. No focal airspace disease or consolidation in the lungs. Normal heart size and pulmonary vascularity.  Surgical clips in the upper abdomen. Contrast material is demonstrated in the right colon. There are prominent gas-filled distended small bowel loops in the mid abdomen and multiple air-fluid levels are demonstrated on the upright view. Appearance is consistent with mechanical small bowel obstruction. No free intra-abdominal air. No radiopaque stones. Degenerative changes in the spine.  IMPRESSION: No evidence of active pulmonary disease. Fibrosis or atelectasis in the left lung base.  Gaseous distention of mid abdominal small bowel with multiple air-fluid levels consistent with mechanical small bowel obstruction.   Electronically Signed   By: Lucienne Capers M.D.   On: 11/24/2014 21:51   Dg Abd Portable 1v  11/26/2014   CLINICAL DATA:  Nasogastric tube placement  EXAM: PORTABLE ABDOMEN - 1 VIEW  COMPARISON:  Portable exam 1318 hours compared to 11/26/2014  FINDINGS: Tip of nasogastric tube projects over proximal stomach.  Numerous surgical clips in the perigastric region.  Multiple dilated loops of small bowel again identified compatible  with small bowel obstruction.  Small amount of contrast within a decompressed colon.  No definite bowel wall thickening.  Lung bases clear.  IMPRESSION:  Small bowel obstruction.  Tip of nasogastric tube projects over proximal stomach.   Electronically Signed   By: Lavonia Dana M.D.   On: 11/26/2014 16:49   Dg Abd Portable 1v-small Bowel Obstruction Protocol-initial, 8 Hr Delay  11/08/2014   CLINICAL DATA:  Small bowel obstruction protocol. Contrast administered at 0345 hours.  EXAM: PORTABLE ABDOMEN - 1 VIEW  COMPARISON:  CT and radiographs 11/07/2014.  FINDINGS: 1156 hours. Nasogastric tube is visualized within the gastric fundus. There is a small amount of contrast within the stomach and distal small bowel which remains dilated. Contrast is also present within the colon, extending to the rectum, increased in density from yesterday, implying that some of the recently administered contrast has passed into the colon. There is still moderate diffuse small bowel distension. Multiple left abdominal surgical clips are noted. No evidence of free intraperitoneal air or contrast extravasation.  IMPRESSION: Persistent moderate small bowel distension in excess of the colon. However, the volume of contrast in the colon has increased from yesterday's radiographs, implying some passage of contrast through the small bowel.   Electronically Signed   By: Richardean Sale M.D.   On: 11/08/2014 12:31   Dg Abd Portable 1v-small Bowel Protocol-position Verification  11/08/2014   CLINICAL DATA:  Nasogastric tube placement  EXAM: PORTABLE ABDOMEN - 1 VIEW  COMPARISON:  CT abdomen and pelvis November 07, 2014  FINDINGS: Nasogastric tube tip and side port are in the stomach. There are loops of dilated bowel indicating a degree of obstruction. No free air apparent. There are multiple surgical clips in the mid abdomen.  IMPRESSION: Nasogastric tube tip and side port in stomach. Dilated loops of bowel consistent with a degree of obstruction.   Electronically Signed   By: Lowella Grip III M.D.   On: 11/08/2014 01:12    Note: This dictation was prepared with Dragon/digital dictation  along with [employer]. Any transcriptional errors that result from this process are unintentional.

## 2014-11-27 NOTE — Care Management Important Message (Signed)
Important Message  Patient Details  Name: Jaysin Gayler MRN: [id number] Date of Birth: 08-Aug-1945   Medicare Important Message Given:  [facility] notification given    Camillo Flaming 11/27/2014, 12:20 [place] Message  Patient Details  Name: Ralphie Lovelady MRN: [id number] Date of Birth: May 22, 1945   Medicare Important Message Given:  Yes-second notification given    Camillo Flaming 11/27/2014, 12:20 PM

## 2014-11-27 NOTE — Progress Notes (Signed)
PARENTERAL NUTRITION CONSULT NOTE - INITIAL  Pharmacy Consult for TPN Indication: SBO  Allergies  Allergen Reactions  . Compazine [Prochlorperazine] Other (See Comments)    Made pt really lethargic, and sleepy   Patient Measurements: Height: 5\' 8"  (172.7 cm) Weight: 163 lb (73.936 kg) IBW/kg (Calculated) : 68.4  Vital Signs: Temp: 98.1 F (36.7 C) (08/29 0653) Temp Source: Oral (08/29 0653) BP: 143/77 mmHg (08/29 0653) Pulse Rate: 88 (08/29 0653) Intake/Output from previous day: 08/28 0701 - 08/29 0700 In: -  Out: 1300 [Urine:1300] Intake/Output from this shift:    Labs:  Recent Labs  11/24/14 2130 11/25/14 0530 11/27/14 0408  WBC 5.8 5.3 4.0  HGB 8.6* 9.0* 7.8*  HCT 26.7* 26.5* 24.7*  PLT 447* 453* 366  APTT  --   --  33  INR  --   --  1.27    Recent Labs  11/24/14 2130 11/25/14 0530 11/26/14 0418 11/27/14 0408  NA 132* 132* 135 134*  K 3.2* 3.4* 3.3* 3.5  CL 99* 102 104 104  CO2 20* 23 25 25   GLUCOSE 96 90 88 125*  BUN 12 12 12 13   CREATININE 1.29* 1.14 1.14 1.03  CALCIUM 8.7* 8.4* 8.0* 8.0*  MG  --  1.5* 1.9 1.7  PHOS  --   --   --  2.1*  PROT 5.7*  --   --  5.0*  ALBUMIN 2.5*  --   --  2.3*  AST 32  --   --  25  ALT 21  --   --  17  ALKPHOS 63  --   --  51  BILITOT 0.3  --   --  0.4   Estimated Creatinine Clearance: 64.6 mL/min (by C-G formula based on Cr of 1.03).   No results for input(s): GLUCAP in the last 72 hours.  Medical History: Past Medical History  Diagnosis Date  . Hypertension   . Anginal pain     hx of CP saw Dr Wynonia Lawman 09/20/2013 H&P per chart   . Left Retroperitoneal mass s/p resection/L nephrectomy 07/13/2014   . Postoperative anemia due to acute blood loss 07/15/2014  . SBO (small bowel obstruction) 08/01/2014, 10/11/14  . Retroperitoneal fluid collection   . 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 Impaired fasting glucose 11/25/2014  . Benign  neoplasm of colon 11/25/2014  . Actinic keratosis 11/25/2014  . Mixed hyperlipidemia 11/25/2014   Medications:  Scheduled:  . bisacodyl  10 mg Rectal Daily  . heparin subcutaneous  5,000 Units Subcutaneous 3 times per day  . linezolid  600 mg Intravenous Q12H  . lip balm  1 application Topical BID  . metronidazole  500 mg Intravenous Q8H   Infusions:  . sodium chloride 75 mL/hr at 11/27/14 0555  . Marland KitchenTPN (CLINIMIX-E) Adult 40 mL/hr at 11/26/14 1722   And  . fat emulsion 240 mL (11/26/14 1722)   Insulin Requirements: none  Current Nutrition: NPO - NG tube in place  IVF: NS at 75 ml/hr  Central access: PICC placed 10/12/14 TPN start date: 8/28  ASSESSMENT  HPI:  2 yoM s/p resection L retroperitoneal sarcoma, L nephrectomy 07/13/14 developed VRE abscess treated with po Linezolid/Flagyl. Hx abd bloating, N/V 2 days PTA, SBO seen on CT for Ca restaging.  Hx recurrent SBO. Begin TPN per Pharmacy  Significant events:   8/29: plan for laparotomy on 8/31; intra-abd abscesses-- for possible IR perc drain 8/29  Today:   Glucose - <150  Electrolytes - K 3.5, phos 2.1, Mg 1.7, CorrCa wnl; Na slightly low at 134  Renal - wnl  LFTs - wnl  TGs - pending  Prealbumin - pending  NUTRITIONAL GOALS                                                                                             RD recs:   Kcal: 2300-2500  Protein: 110-120 grams  Fluid: 2.2-2.5 L/day  Clinimix E 5/15 at a goal rate of 133ml/hr + 20% fat emulsion at 10 ml/hr to provide: 120 g/day protein, 2184 Kcal/day +++ if patient tolerates clinimix E 5/15 at goal rate of 100 ml/hr and CBGs are within goal range, then will change to clinimix E 5/20 at goal rate of 93 ml/hr to obtain goal protein 112 g/day and total Kcal 2444 +++  PLAN                                                                                                                          1) potassium chloride 10 mEq IV x2 runs 2) potassium phosphate 10 mmol x1 3) magnesium sulfate 1gm IV x1 4) At 1800 today:  Increase Clinimix E 5/15 rate to 70 ml/hr.  20% fat emulsion at 10 ml/hr.  Plan to advance as tolerated to the goal rate.  TPN to contain standard multivitamins and trace elements.  Reduce IVF to 45 ml/hr.  Start sensitive SSI q6h  TPN lab panels on Mondays & Thursdays.  F/u daily.  Dia Sitter, PharmD, BCPS 11/27/2014 7:31 AM

## 2014-11-28 ENCOUNTER — Inpatient Hospital Stay (HOSPITAL_COMMUNITY): Payer: Medicare Other

## 2014-11-28 LAB — GLUCOSE, CAPILLARY
GLUCOSE-CAPILLARY: 116 mg/dL — AB (ref 65–99)
GLUCOSE-CAPILLARY: 147 mg/dL — AB (ref 65–99)
Glucose-Capillary: 133 mg/dL — ABNORMAL HIGH (ref 65–99)
Glucose-Capillary: 145 mg/dL — ABNORMAL HIGH (ref 65–99)
Glucose-Capillary: 117 mg/dL — ABNORMAL HIGH (ref 65–99)

## 2014-11-28 LAB — PREPARE RBC (CROSSMATCH)

## 2014-11-28 MED ORDER — FAT EMULSION 20 % IV EMUL
240.0000 mL | INTRAVENOUS | Status: AC
  Administered 2014-11-28: 240 mL via INTRAVENOUS
  Filled 2014-11-28: qty 250

## 2014-11-28 MED ORDER — TRACE MINERALS CR-CU-MN-SE-ZN 10-1000-500-60 MCG/ML IV SOLN
INTRAVENOUS | Status: AC
  Administered 2014-11-28: 17:00:00 via INTRAVENOUS
  Filled 2014-11-28: qty 2400

## 2014-11-28 MED ORDER — CEFAZOLIN SODIUM-DEXTROSE 2-3 GM-% IV SOLR
2.0000 g | INTRAVENOUS | Status: AC
  Administered 2014-11-29: 2 g via INTRAVENOUS
  Filled 2014-11-28: qty 50

## 2014-11-28 MED ORDER — SODIUM CHLORIDE 0.9 % IV SOLN
INTRAVENOUS | Status: DC
  Administered 2014-11-28 – 2014-11-30 (×2): via INTRAVENOUS

## 2014-11-28 NOTE — Anesthesia Preprocedure Evaluation (Addendum)
Anesthesia Evaluation  Patient identified by MRN, date of birth, ID band Patient awake    Reviewed: Allergy & Precautions, NPO status , Patient's Chart, lab work & pertinent test results, reviewed documented beta blocker date and time   History of Anesthesia Complications Negative for: history of anesthetic complications  Airway Mallampati: II  TM Distance: >3 FB Neck ROM: Full    Dental no notable dental hx. (+) Dental Advisory Given   Pulmonary neg pulmonary ROS,  breath sounds clear to auscultation  Pulmonary exam normal       Cardiovascular hypertension, Pt. on medications and Pt. on home beta blockers + angina Normal cardiovascular examRhythm:Regular Rate:Normal  Prolonged QT   Neuro/Psych Spinal stenosis negative neurological ROS  negative psych ROS   GI/Hepatic negative GI ROS, Neg liver ROS,   Endo/Other  negative endocrine ROS  Renal/GU negative Renal ROS  negative genitourinary   Musculoskeletal negative musculoskeletal ROS (+)   Abdominal   Peds negative pediatric ROS (+)  Hematology  (+) anemia , hgb 7.8   Anesthesia Other Findings   Reproductive/Obstetrics negative OB ROS                            Anesthesia Physical Anesthesia Plan  ASA: III  Anesthesia Plan: General   Post-op Pain Management:    Induction: Intravenous  Airway Management Planned: Oral ETT  Additional Equipment:   Intra-op Plan:   Post-operative Plan: Extubation in OR  Informed Consent:   Plan Discussed with: Surgeon  Anesthesia Plan Comments:         Anesthesia Quick Evaluation

## 2014-11-28 NOTE — Progress Notes (Signed)
PARENTERAL NUTRITION CONSULT NOTE - INITIAL  Pharmacy Consult for TPN Indication: SBO  Allergies  Allergen Reactions  . Compazine [Prochlorperazine] Other (See Comments)    Made pt really lethargic, and sleepy   Patient Measurements: Height: 5\' 8"  (172.7 cm) Weight: 163 lb (73.936 kg) IBW/kg (Calculated) : 68.4  Vital Signs: Temp: 98.4 F (36.9 C) (08/30 0526) Temp Source: Oral (08/30 0526) BP: 139/78 mmHg (08/30 0526) Pulse Rate: 85 (08/30 0526) Intake/Output from previous day: 08/29 0701 - 08/30 0700 In: 3769 [I.V.:1065; NG/GT:240; IV Piggyback:900; TPN:1324] Out: 4575 [Urine:2575; Emesis/NG output:2000] Intake/Output from this shift:    Labs:  Recent Labs  11/27/14 0408  WBC 4.0  HGB 7.8*  HCT 24.7*  PLT 366  APTT 33  INR 1.27    Recent Labs  11/26/14 0418 11/27/14 0408  NA 135 134*  K 3.3* 3.5  CL 104 104  CO2 25 25  GLUCOSE 88 125*  BUN 12 13  CREATININE 1.14 1.03  CALCIUM 8.0* 8.0*  MG 1.9 1.7  PHOS  --  2.1*  PROT  --  5.0*  ALBUMIN  --  2.3*  AST  --  25  ALT  --  17  ALKPHOS  --  51  BILITOT  --  0.4  PREALBUMIN  --  12.1*  TRIG  --  132   Estimated Creatinine Clearance: 64.6 mL/min (by C-G formula based on Cr of 1.03).    Recent Labs  11/27/14 2038 11/27/14 2334 11/28/14 0514  GLUCAP 120* 116* 133*    Medical History: Past Medical History  Diagnosis Date  . Hypertension   . Anginal pain     hx of CP saw Dr Wynonia Lawman 09/20/2013 H&P per chart   . Left Retroperitoneal mass s/p resection/L nephrectomy 07/13/2014   . Postoperative anemia due to acute blood loss 07/15/2014  . SBO (small bowel obstruction) 08/01/2014, 10/11/14  . Retroperitoneal fluid collection   . 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 Impaired fasting glucose 11/25/2014  . Benign neoplasm of colon 11/25/2014  . Actinic keratosis 11/25/2014  . Mixed hyperlipidemia 11/25/2014    Medications:  Scheduled:  . sodium chloride   Intravenous Once  . bisacodyl  10 mg Rectal Daily  . heparin subcutaneous  5,000 Units Subcutaneous 3 times per day  . insulin aspart  0-9 Units Subcutaneous 4 times per day  . linezolid  600 mg Intravenous Q12H  . lip balm  1 application Topical BID  . metronidazole  500 mg Intravenous Q8H   Infusions:  . Marland KitchenTPN (CLINIMIX-E) Adult 70 mL/hr at 11/27/14 1809   And  . fat emulsion 240 mL (11/27/14 1810)  . sodium chloride 45 mL/hr at 11/27/14 1923   Insulin Requirements: 1 units over the past 24 hours (on sensitive SSI q6h)  Current Nutrition: NPO - NG tube in place  IVF: NS at 45 ml/hr  Central access: PICC placed 10/12/14 TPN start date: 8/28  ASSESSMENT  HPI:  25 yoM s/p resection L retroperitoneal sarcoma, L nephrectomy 07/13/14 developed VRE abscess treated with po Linezolid/Flagyl. Hx abd bloating, N/V 2 days PTA, SBO seen on CT for Ca restaging.  Hx recurrent SBO. Begin TPN per Pharmacy  Significant events:   8/29: plan for exp laparotomy on 8/31; s/p aspiration of L retroperitoneal fluid collection on 8/29  Today:   Glucose - <150  Electrolytes - K 3.5, phos 2.1, Mg 1.7, CorrCa wnl; Na slightly low at 134 on 8/29  Renal - wnl  LFTs - wnl  TGs - pending  Prealbumin - pending  NUTRITIONAL GOALS                                                                                             RD recs:   Kcal: 2300-2500  Protein: 110-120 grams  Fluid: 2.2-2.5 L/day  Clinimix E 5/15 at a goal rate of 171ml/hr + 20% fat emulsion at 10 ml/hr to provide: 120 g/day protein, 2184 Kcal/day +++ if patient tolerates clinimix E 5/15 at goal rate of 100 ml/hr and CBGs are within goal range, then will change to clinimix E 5/20 at goal rate of 90 ml/hr to obtain goal protein 108 g/day and total Kcal 2380 +++  PLAN                                                                                                                           At 1800 today:  Increase Clinimix E 5/15 rate to 100 ml/hr (goal rate)  20% fat emulsion at 10 ml/hr.  TPN to contain standard multivitamins and trace elements.  Reduce IVF to  [facility].  continue sensitive SSI q6h  TPN lab panels on Mondays & Thursdays.  F/u daily.  Dia Sitter, PharmD, BCPS 11/28/2014 7:29 AM

## 2014-11-28 NOTE — Progress Notes (Addendum)
PROGRESS NOTE  Darryl Griffith DJM:[id number] DOB: 10/25/1945 DOA: 11/24/2014 PCP: Gennette Pac, MD  HPI: Darryl Griffith is a 69 y.o. 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 General surgery was consulted on admission. Patient did not improve with conservative measures and is scheduled to undergo surgery 8/31.  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 Gram stain negative.  - ID following  HTN - prn hydralazine - BP stable  Hypokalemia - stable  Hypomagnesemia - improved with repletion  Anemia - monitor CBC, likely of chronic illness, no bleeding apparent - will likely require transfusion post op   Diet: Diet NPO time specified Except for: Sips with Meds .TPN (CLINIMIX-E) Adult .TPN (CLINIMIX-E) Adult Fluids: NS DVT Prophylaxis: SCD  Code Status:  Full Code Family Communication: no family bedside  Disposition Plan: remain inpatient  Consultants:  General surgery   ID  Procedures:  None    Antibiotics  Anti-infectives    Start     Dose/Rate Route Frequency Ordered Stop   11/29/14 0830  ceFAZolin (ANCEF) IVPB 2 g/50 mL premix     2 g 100 mL/hr over 30 Minutes Intravenous On call to O.R. 11/28/14 0850 11/30/14 0559   11/25/14 0000  metroNIDAZOLE (FLAGYL) IVPB 500 mg     500 mg 100 mL/hr over 60 Minutes Intravenous Every 8 hours 11/24/14 2355     11/25/14 0000  linezolid (ZYVOX) IVPB 600 mg     600 mg 300 mL/hr over 60 Minutes Intravenous Every 12 hours 11/24/14 2355        Studies Ct Aspiration  11/28/2014   CLINICAL DATA:  Recurrent left retroperitoneal fluid collection after previous resection of sarcoma. The patient now presents with a small bowel obstruction. Aspiration of the fluid collection has been requested prior to planned surgery to determine if there is any evidence of infection. This fluid collection was last aspirated on 11/10/2014 revealing vancomycin resistant Enterococcus and also previously contained a percutaneous drain for several months.  EXAM: CT GUIDED CATHETER DRAINAGE OF LEFT RETROPERITONEAL FLUID COLLECTION  ANESTHESIA/SEDATION: 1.5  Mg IV Versed; 75 mcg IV Fentanyl  Total Moderate Sedation Time: 15 minutes  PROCEDURE: The procedure risks, benefits, and alternatives were explained to the patient. Questions regarding the procedure were encouraged and answered. The patient understands and consents to the procedure. A time-out was performed prior to the procedure.  The left flank region was prepped with Betadine in  a sterile fashion, and a sterile drape was applied covering the operative field. A sterile gown and sterile gloves were used for the procedure. Local anesthesia was provided with 1% Lidocaine.  Under CT guidance, a 5 [place] Yueh centesis catheter was advanced into the left retroperitoneal space.  Fluid aspiration was performed through the 5 French catheter. The catheter was then removed. Additional CT was performed.  COMPLICATIONS: None  FINDINGS: Recurrent fluid collection in the medial left retroperitoneal space again identified, identical in appearance to scanning just prior to the previous aspiration procedure on 08/12. Approximately 10 mL of pink colored, turbid fluid was able to be aspirated. Material was sent for culture analysis. Postprocedural CT shows decrease in size of the fluid collection.  IMPRESSION: CT-guided aspiration of recurrent medial left retroperitoneal fluid collection yielding 10 mL of pink colored, turbid fluid. Fluid was sent for culture analysis.   Electronically Signed   By: Aletta Edouard M.D.   On: 11/28/2014 08:21   Dg Abd 2 Views  11/27/2014   CLINICAL DATA:  Followup small bowel obstruction  EXAM: ABDOMEN - 2 VIEW  COMPARISON:  11/26/2014  FINDINGS: The nasogastric tube is identified with tip in the stomach. The side port is below the GE junction. Dilated loops of small bowel and air-fluid levels are again noted. These measure up to 6.7 cm. Enteric contrast material is identified within the colon up to the level of the rectum.  IMPRESSION: 1. NG tube is within the stomach. 2. Persistent partial small bowel obstruction 3. Enteric contrast material is identified up the level of the rectum.   Electronically Signed   By: Kerby Moors M.D.   On: 11/27/2014 09:35   Dg Abd Portable 1v  11/28/2014   CLINICAL DATA:  Evaluate NG tube repositioning.  EXAM: PORTABLE ABDOMEN - 1 VIEW  COMPARISON:  11/27/2014  FINDINGS: The nasogastric tube has been repositioned. The tip is in the fundus of the stomach in the side port is below the GE junction. Surgical clips are noted in the left upper quadrant of the abdomen. Again noted are dilated loops of bowel measuring up to 5 cm.  IMPRESSION: 1. Nasogastric tube is in the stomach. 2. Persistent small bowel dilatation consistent with  obstruction.   Electronically Signed   By: Kerby Moors M.D.   On: 11/28/2014 09:05   Dg Abd Portable 1v  11/26/2014   CLINICAL DATA:  Nasogastric tube placement  EXAM: PORTABLE ABDOMEN - 1 VIEW  COMPARISON:  Portable exam 1318 hours compared to 11/26/2014  FINDINGS: Tip of nasogastric tube projects over proximal stomach.  Numerous surgical clips in the perigastric region.  Multiple dilated loops of small bowel again identified compatible with small bowel obstruction.  Small amount of contrast within a decompressed colon.  No definite bowel wall thickening.  Lung bases clear.  IMPRESSION: Small bowel obstruction.  Tip of nasogastric tube projects over proximal stomach.   Electronically Signed   By: Lavonia Dana M.D.   On: 11/26/2014 16:49   Objective  Filed Vitals:   11/27/14 1443 11/27/14 2033 11/28/14 0526 11/28/14 0819  BP: 134/82 154/79 139/78   Pulse: 89 84 85   Temp: 98.1 F (36.7 C) 98.5 F (36.9 C) 98.4 F (36.9 C)   TempSrc: Oral Oral Oral   Resp: 17 18 18    Height:      Weight:      SpO2:  100% 98% 99%    Intake/Output Summary (Last 24 hours) at 11/28/14 1227 Last  data filed at 11/28/14 0800  Gross per 24 hour  Intake   3189 ml  Output   4375 ml  Net  -1186 ml   Filed Weights   11/24/14 2020  Weight: 73.936 kg (163 lb)   Exam:  GENERAL: NAD, NG tube in place  LUNGS: Clear to auscultation. No wheezing or crackles  HEART: Regular rate and rhythm without murmur. 2+ pulses, no JVD, no peripheral edema  ABDOMEN: Soft, nontender, and nondistended. No bowel sounds.   EXTREMITIES: Without any cyanosis, clubbing, rash, lesions or edema.  Data Reviewed: Basic Metabolic Panel:  Recent Labs Lab 11/24/14 2130 11/25/14 0530 11/26/14 0418 11/27/14 0408  NA 132* 132* 135 134*  K 3.2* 3.4* 3.3* 3.5  CL 99* 102 104 104  CO2 20* 23 25 25   GLUCOSE 96 90 88 125*  BUN 12 12 12 13   CREATININE 1.29* 1.14 1.14 1.03  CALCIUM 8.7* 8.4* 8.0* 8.0*  MG  --  1.5* 1.9 1.7    PHOS  --   --   --  2.1*   Liver Function Tests:  Recent Labs Lab 11/24/14 2130 11/27/14 0408  AST 32 25  ALT 21 17  ALKPHOS 63 51  BILITOT 0.3 0.4  PROT 5.7* 5.0*  ALBUMIN 2.5* 2.3*    Recent Labs Lab 11/24/14 2130  LIPASE 47   CBC:  Recent Labs Lab 11/24/14 2130 11/25/14 0530 11/27/14 0408  WBC 5.8 5.3 4.0  NEUTROABS 4.7  --  3.1  HGB 8.6* 9.0* 7.8*  HCT 26.7* 26.5* 24.7*  MCV 80.7 80.1 81.0  PLT 447* 453* 366   Scheduled Meds: . sodium chloride   Intravenous Once  . bisacodyl  10 mg Rectal Daily  . [START ON 11/29/2014]  ceFAZolin (ANCEF) IV  2 g Intravenous On Call to OR  . heparin subcutaneous  5,000 Units Subcutaneous 3 times per day  . insulin aspart  0-9 Units Subcutaneous 4 times per day  . linezolid  600 mg Intravenous Q12H  . lip balm  1 application Topical BID  . metronidazole  500 mg Intravenous Q8H   Continuous Infusions: . Marland KitchenTPN (CLINIMIX-E) Adult 70 mL/hr at 11/27/14 1809   And  . fat emulsion 240 mL (11/27/14 1810)  . Marland KitchenTPN (CLINIMIX-E) Adult     And  . fat emulsion    . sodium chloride 45 mL/hr at 11/27/14 1923  . sodium chloride      Marzetta Board, MD [facility] Pager [phone]. If 7 PM - 7 AM, please contact night-coverage at [email or web address], password [facility] 11/28/2014, 12:27 PM  LOS: 4 days

## 2014-11-28 NOTE — Progress Notes (Signed)
Patient ID: Darryl Griffith, male   DOB: [birth year], 69 y.o.   MRN: [id number]     [facility]      [address] [place], [place] [address]    Phone: [phone] FAX: [phone]     Subjective: Having issues sleeping.    Objective:  Vital signs:  Filed Vitals:   11/27/14 1443 11/27/14 2033 11/28/14 0526 11/28/14 0819  BP: 134/82 154/79 139/78   Pulse: 89 84 85   Temp: 98.1 F (36.7 C) 98.5 F (36.9 C) 98.4 F (36.9 C)   TempSrc: Oral Oral Oral   Resp: 17 18 18    Height:      Weight:      SpO2:  100% 98% 99%    Last BM Date: 11/27/14  Intake/Output   Yesterday:  08/29 0701 - 08/30 0700 In: 5400 [I.V.:1065; NG/GT:240; IV Piggyback:900; QQP:6195] Out: 4575 [Urine:2575; Emesis/NG output:2000] This shift:      Physical Exam: General: Pt awake/alert/oriented x4 in no acute distress Abdomen: Soft. Bilious output.  47m out. No evidence of peritonitis.  No incarcerated hernias.   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   Hypertension   Anemia of chronic disease   Abscess, retroperitoneal   Malnutrition of moderate degree   Hypokalemia    Results:   Labs: Results for orders placed or performed during the hospital encounter of 11/24/14 (from the past 48 hour(s))  Comprehensive metabolic panel     Status: Abnormal   Collection Time: 11/27/14  4:08 AM  Result Value Ref Range   Sodium 134 (L) 135 - 145 mmol/L   Potassium 3.5 3.5 - 5.1 mmol/L   Chloride 104 101 - 111 mmol/L   CO2 25 22 - 32 mmol/L   Glucose, Bld 125 (H) 65 - 99 mg/dL   BUN 13 6 - 20 mg/dL   Creatinine, Ser 1.03 0.61 - 1.24 mg/dL   Calcium 8.0 (L) 8.9 - 10.3 mg/dL   Total Protein 5.0 (L) 6.5 - 8.1 g/dL   Albumin 2.3 (L) 3.5 - 5.0 g/dL   AST 25 15 - 41 U/L   ALT 17 17 - 63  U/L   Alkaline Phosphatase 51 38 - 126 U/L   Total Bilirubin 0.4 0.3 - 1.2 mg/dL   GFR calc non Af Amer >60 >60 mL/min   GFR calc Af Amer >60 >60 mL/min    Comment: (NOTE) The eGFR has been calculated using the CKD EPI equation. This calculation has not been validated in all clinical situations. eGFR's persistently <60 mL/min signify possible Chronic Kidney Disease.    Anion gap 5 5 - 15  Magnesium     Status: None   Collection Time: 11/27/14  4:08 AM  Result Value Ref Range   Magnesium 1.7 1.7 - 2.4 mg/dL  Phosphorus     Status: Abnormal   Collection Time: 11/27/14  4:08 AM  Result Value Ref Range   Phosphorus 2.1 (L) 2.5 - 4.6 mg/dL  CBC     Status: Abnormal   Collection Time: 11/27/14  4:08 AM  Result Value Ref Range   WBC 4.0 4.0 - 10.5 K/uL   RBC 3.05 (L) 4.22 - 5.81 MIL/uL   Hemoglobin 7.8 (L) 13.0 - 17.0 g/dL   HCT 24.7 (L) 39.0 - 52.0 %   MCV  81.0 78.0 - 100.0 fL   MCH 25.6 (L) 26.0 - 34.0 pg   MCHC 31.6 30.0 - 36.0 g/dL   RDW 17.4 (H) 11.5 - 15.5 %   Platelets 366 150 - 400 K/uL  Differential     Status: Abnormal   Collection Time: 11/27/14  4:08 AM  Result Value Ref Range   Neutrophils Relative % 77 43 - 77 %   Neutro Abs 3.1 1.7 - 7.7 K/uL   Lymphocytes Relative 8 (L) 12 - 46 %   Lymphs Abs 0.3 (L) 0.7 - 4.0 K/uL   Monocytes Relative 13 (H) 3 - 12 %   Monocytes Absolute 0.5 0.1 - 1.0 K/uL   Eosinophils Relative 3 0 - 5 %   Eosinophils Absolute 0.1 0.0 - 0.7 K/uL   Basophils Relative 1 0 - 1 %   Basophils Absolute 0.0 0.0 - 0.1 K/uL  Triglycerides     Status: None   Collection Time: 11/27/14  4:08 AM  Result Value Ref Range   Triglycerides 132 <150 mg/dL    Comment: Performed at [facility]  Prealbumin     Status: Abnormal   Collection Time: 11/27/14  4:08 AM  Result Value Ref Range   Prealbumin 12.1 (L) 18 - 38 mg/dL    Comment: Performed at [facility]     Status: Abnormal   Collection Time: 11/27/14  4:08 AM  Result  Value Ref Range   Prothrombin Time 16.1 (H) 11.6 - 15.2 seconds   INR 1.27 0.00 - 1.49  APTT     Status: None   Collection Time: 11/27/14  4:08 AM  Result Value Ref Range   aPTT 33 24 - 37 seconds  Culture, routine-abscess     Status: None (Preliminary result)   Collection Time: 11/27/14 12:22 PM  Result Value Ref Range   Specimen Description      ABSCESS ASPIRATION OF RECURRENT LEFT RETROPERITONEAL FLUID. Nephrectomy   Special Requests Normal    Gram Stain      ABUNDANT WBC PRESENT,BOTH PMN AND MONONUCLEAR NO SQUAMOUS EPITHELIAL CELLS SEEN NO ORGANISMS SEEN Performed at [facility]    Culture PENDING    Report Status PENDING   Anaerobic culture     Status: None (Preliminary result)   Collection Time: 11/27/14 12:22 PM  Result Value Ref Range   Specimen Description ABSCESS LEFT RETROPERITONEAL FLUID. Nephrectomy     Special Requests Normal    Gram Stain      ABUNDANT WBC PRESENT,BOTH PMN AND MONONUCLEAR NO SQUAMOUS EPITHELIAL CELLS SEEN NO ORGANISMS SEEN Performed at [facility]    Culture PENDING    Report Status PENDING   Glucose, capillary     Status: Abnormal   Collection Time: 11/27/14 12:39 PM  Result Value Ref Range   Glucose-Capillary 114 (H) 65 - 99 mg/dL   Comment 1 Notify RN   Glucose, capillary     Status: Abnormal   Collection Time: 11/27/14  5:24 PM  Result Value Ref Range   Glucose-Capillary 119 (H) 65 - 99 mg/dL   Comment 1 Notify RN   Glucose, capillary     Status: Abnormal   Collection Time: 11/27/14  8:38 PM  Result Value Ref Range   Glucose-Capillary 120 (H) 65 - 99 mg/dL  Glucose, capillary     Status: Abnormal   Collection Time: 11/27/14 11:34 PM  Result Value Ref Range   Glucose-Capillary 116 (H) 65 - 99 mg/dL  Glucose, capillary     Status: Abnormal   Collection Time: 11/28/14  5:14 AM  Result Value Ref Range   Glucose-Capillary 133 (H) 65 - 99 mg/dL    Imaging / Studies: Ct Aspiration  11/28/2014   CLINICAL DATA:   Recurrent left retroperitoneal fluid collection after previous resection of sarcoma. The patient now presents with a small bowel obstruction. Aspiration of the fluid collection has been requested prior to planned surgery to determine if there is any evidence of infection. This fluid collection was last aspirated on 11/10/2014 revealing vancomycin resistant Enterococcus and also previously contained a percutaneous drain for several months.  EXAM: CT GUIDED CATHETER DRAINAGE OF LEFT RETROPERITONEAL FLUID COLLECTION  ANESTHESIA/SEDATION: 1.5  Mg IV Versed; 75 mcg IV Fentanyl  Total Moderate Sedation Time: 15 minutes  PROCEDURE: The procedure risks, benefits, and alternatives were explained to the patient. Questions regarding the procedure were encouraged and answered. The patient understands and consents to the procedure. A time-out was performed prior to the procedure.  The left flank region was prepped with Betadine in a sterile fashion, and a sterile drape was applied covering the operative field. A sterile gown and sterile gloves were used for the procedure. Local anesthesia was provided with 1% Lidocaine.  Under CT guidance, a 5 [place] Yueh centesis catheter was advanced into the left retroperitoneal space. Fluid aspiration was performed through the 5 French catheter. The catheter was then removed. Additional CT was performed.  COMPLICATIONS: None  FINDINGS: Recurrent fluid collection in the medial left retroperitoneal space again identified, identical in appearance to scanning just prior to the previous aspiration procedure on 08/12. Approximately 10 mL of pink colored, turbid fluid was able to be aspirated. Material was sent for culture analysis. Postprocedural CT shows decrease in size of the fluid collection.  IMPRESSION: CT-guided aspiration of recurrent medial left retroperitoneal fluid collection yielding 10 mL of pink colored, turbid fluid. Fluid was sent for culture analysis.   Electronically Signed   By:  Aletta Edouard M.D.   On: 11/28/2014 08:21   Dg Abd 2 Views  11/27/2014   CLINICAL DATA:  Followup small bowel obstruction  EXAM: ABDOMEN - 2 VIEW  COMPARISON:  11/26/2014  FINDINGS: The nasogastric tube is identified with tip in the stomach. The side port is below the GE junction. Dilated loops of small bowel and air-fluid levels are again noted. These measure up to 6.7 cm. Enteric contrast material is identified within the colon up to the level of the rectum.  IMPRESSION: 1. NG tube is within the stomach. 2. Persistent partial small bowel obstruction 3. Enteric contrast material is identified up the level of the rectum.   Electronically Signed   By: Kerby Moors M.D.   On: 11/27/2014 09:35   Dg Abd 2 Views  11/26/2014   CLINICAL DATA:  Abdominal distention with nausea and vomiting. Surgery in April. Small bowel obstruction symptoms.  EXAM: ABDOMEN - 2 VIEW  COMPARISON:  11/24/2014  FINDINGS: Upright and supine views. Upright view demonstrates persistent small bowel air-fluid levels, increased. Small left pleural effusion. There may be a nasogastric tube looped in the esophagus, incompletely imaged. Surgical clips in the upper abdomen. A metallic object projecting over the a lower chest is presumably external to the patient.  Supine images demonstrate small bowel distension. Example loop measures 6.7 cm today, versus 6.4 cm on the prior. Contrast is identified within the colon. No pneumatosis.  IMPRESSION: Similar small bowel obstruction pattern, without free intraperitoneal air or  other acute complication.  The nasogastric may be looped in the distal esophagus. Incompletely imaged. These results will be called to the ordering clinician or representative by the Radiologist Assistant, and communication documented in the PACS or zVision Dashboard.   Electronically Signed   By: Abigail Miyamoto M.D.   On: 11/26/2014 10:07   Dg Abd Portable 1v  11/26/2014   CLINICAL DATA:  Nasogastric tube placement  EXAM:  PORTABLE ABDOMEN - 1 VIEW  COMPARISON:  Portable exam 1318 hours compared to 11/26/2014  FINDINGS: Tip of nasogastric tube projects over proximal stomach.  Numerous surgical clips in the perigastric region.  Multiple dilated loops of small bowel again identified compatible with small bowel obstruction.  Small amount of contrast within a decompressed colon.  No definite bowel wall thickening.  Lung bases clear.  IMPRESSION: Small bowel obstruction.  Tip of nasogastric tube projects over proximal stomach.   Electronically Signed   By: Lavonia Dana M.D.   On: 11/26/2014 16:49    Medications / Allergies:  Scheduled Meds: . sodium chloride   Intravenous Once  . bisacodyl  10 mg Rectal Daily  . heparin subcutaneous  5,000 Units Subcutaneous 3 times per day  . insulin aspart  0-9 Units Subcutaneous 4 times per day  . linezolid  600 mg Intravenous Q12H  . lip balm  1 application Topical BID  . metronidazole  500 mg Intravenous Q8H   Continuous Infusions: . Marland KitchenTPN (CLINIMIX-E) Adult 70 mL/hr at 11/27/14 1809   And  . fat emulsion 240 mL (11/27/14 1810)  . sodium chloride 45 mL/hr at 11/27/14 1923   PRN Meds:.acetaminophen **OR** acetaminophen, alum & mag hydroxide-simeth, diphenhydrAMINE, HYDROmorphone (DILAUDID) injection, LORazepam, magic mouthwash, menthol-cetylpyridinium, metoprolol, ondansetron **OR** ondansetron (ZOFRAN) IV, phenol, promethazine, sodium chloride  Antibiotics: Anti-infectives    Start     Dose/Rate Route Frequency Ordered Stop   11/25/14 0000  metroNIDAZOLE (FLAGYL) IVPB 500 mg     500 mg 100 mL/hr over 60 Minutes Intravenous Every 8 hours 11/24/14 2355     11/25/14 0000  linezolid (ZYVOX) IVPB 600 mg     600 mg 300 mL/hr over 60 Minutes Intravenous Every 12 hours 11/24/14 2355          Assessment/Plan left retroperitoneal liposarcoma s/p resection and left nephrectomy 07/13/14 s/p XRT---Dr. Barry Dienes  Post op intra-abdominal abscesses(VRE) Recurrent SBO-NGT, NPO, TPN.     ID-linezolid/flagyl for VRE.  IR perc aspiration yesterday.  cx pending.    PCM-prealbumin 12, TPN.  Acute on chronic anemia-h&h 7.8/24.7. Have type and crossed for surgery Dispo-OR tomorrow     11/28/2014  8:48 AM

## 2014-11-29 ENCOUNTER — Ambulatory Visit: Admission: RE | Admit: 2014-11-29 | Payer: Medicare Other | Source: Ambulatory Visit | Admitting: Radiation Oncology

## 2014-11-29 ENCOUNTER — Encounter (HOSPITAL_COMMUNITY): Payer: Self-pay | Admitting: Registered Nurse

## 2014-11-29 ENCOUNTER — Encounter (HOSPITAL_COMMUNITY)
Admission: EM | Disposition: A | Discharge status: Skilled Nursing Facility | Payer: Self-pay | Source: Home / Self Care | Attending: Internal Medicine

## 2014-11-29 ENCOUNTER — Inpatient Hospital Stay (HOSPITAL_COMMUNITY): Payer: Medicare Other | Admitting: Anesthesiology

## 2014-11-29 HISTORY — PX: LAPAROTOMY: SHX154

## 2014-11-29 HISTORY — PX: IRRIGATION AND DEBRIDEMENT ABSCESS: SHX5252

## 2014-11-29 LAB — BASIC METABOLIC PANEL
ANION GAP: 6 (ref 5–15)
BUN: 18 mg/dL (ref 6–20)
CALCIUM: 8.2 mg/dL — AB (ref 8.9–10.3)
CO2: 29 mmol/L (ref 22–32)
CREATININE: 0.91 mg/dL (ref 0.61–1.24)
Chloride: 100 mmol/L — ABNORMAL LOW (ref 101–111)
Glucose, Bld: 127 mg/dL — ABNORMAL HIGH (ref 65–99)
Potassium: 4.1 mmol/L (ref 3.5–5.1)
SODIUM: 135 mmol/L (ref 135–145)

## 2014-11-29 LAB — CBC
HEMATOCRIT: 25.8 % — AB (ref 39.0–52.0)
Hemoglobin: 8.5 g/dL — ABNORMAL LOW (ref 13.0–17.0)
MCH: 26.6 pg (ref 26.0–34.0)
MCHC: 32.9 g/dL (ref 30.0–36.0)
MCV: 80.6 fL (ref 78.0–100.0)
Platelets: 352 10*3/uL (ref 150–400)
RBC: 3.2 MIL/uL — ABNORMAL LOW (ref 4.22–5.81)
RDW: 17.4 % — AB (ref 11.5–15.5)
WBC: 3.5 10*3/uL — AB (ref 4.0–10.5)

## 2014-11-29 LAB — GLUCOSE, CAPILLARY
GLUCOSE-CAPILLARY: 133 mg/dL — AB (ref 65–99)
GLUCOSE-CAPILLARY: 205 mg/dL — AB (ref 65–99)
GLUCOSE-CAPILLARY: 230 mg/dL — AB (ref 65–99)
Glucose-Capillary: 124 mg/dL — ABNORMAL HIGH (ref 65–99)

## 2014-11-29 LAB — SURGICAL PCR SCREEN
MRSA, PCR: NEGATIVE
STAPHYLOCOCCUS AUREUS: NEGATIVE

## 2014-11-29 SURGERY — LAPAROTOMY, EXPLORATORY
Anesthesia: General

## 2014-11-29 MED ORDER — BUPIVACAINE HCL (PF) 0.25 % IJ SOLN
INTRAMUSCULAR | Status: AC
  Filled 2014-11-29: qty 30

## 2014-11-29 MED ORDER — LACTATED RINGERS IV SOLN
INTRAVENOUS | Status: DC
  Administered 2014-11-29: 11:00:00 via INTRAVENOUS

## 2014-11-29 MED ORDER — BUPIVACAINE 0.25 % ON-Q PUMP DUAL CATH 300 ML
300.0000 mL | INJECTION | Status: DC
  Administered 2014-11-29: 300 mL
  Filled 2014-11-29: qty 300

## 2014-11-29 MED ORDER — TRACE MINERALS CR-CU-MN-SE-ZN 10-1000-500-60 MCG/ML IV SOLN
INTRAVENOUS | Status: AC
  Administered 2014-11-29: 17:00:00 via INTRAVENOUS
  Filled 2014-11-29: qty 2400

## 2014-11-29 MED ORDER — ROCURONIUM BROMIDE 100 MG/10ML IV SOLN
INTRAVENOUS | Status: DC | PRN
  Administered 2014-11-29: 30 mg via INTRAVENOUS
  Administered 2014-11-29: 10 mg via INTRAVENOUS
  Administered 2014-11-29: 20 mg via INTRAVENOUS
  Administered 2014-11-29 (×2): 10 mg via INTRAVENOUS

## 2014-11-29 MED ORDER — LIDOCAINE HCL (CARDIAC) 20 MG/ML IV SOLN
INTRAVENOUS | Status: AC
  Filled 2014-11-29: qty 5

## 2014-11-29 MED ORDER — ONDANSETRON HCL 4 MG/2ML IJ SOLN
INTRAMUSCULAR | Status: AC
Start: 2014-11-29 — End: 2014-11-29
  Filled 2014-11-29: qty 2

## 2014-11-29 MED ORDER — PHENYLEPHRINE HCL 10 MG/ML IJ SOLN
INTRAMUSCULAR | Status: DC | PRN
  Administered 2014-11-29 (×2): 80 ug via INTRAVENOUS

## 2014-11-29 MED ORDER — HYDROMORPHONE HCL 2 MG/ML IJ SOLN
INTRAMUSCULAR | Status: AC
  Filled 2014-11-29: qty 1

## 2014-11-29 MED ORDER — LACTATED RINGERS IV SOLN
INTRAVENOUS | Status: DC | PRN
  Administered 2014-11-29: 09:00:00 via INTRAVENOUS

## 2014-11-29 MED ORDER — LACTATED RINGERS IV SOLN
INTRAVENOUS | Status: DC
  Administered 2014-11-29: 13:00:00 via INTRAVENOUS

## 2014-11-29 MED ORDER — LACTATED RINGERS IV SOLN
INTRAVENOUS | Status: DC | PRN
  Administered 2014-11-29: 08:00:00 via INTRAVENOUS

## 2014-11-29 MED ORDER — DIPHENHYDRAMINE HCL 12.5 MG/5ML PO ELIX: 12.5000 mg | ORAL_SOLUTION | Freq: Four times a day (QID) | ORAL | Status: DC | PRN

## 2014-11-29 MED ORDER — ONDANSETRON HCL 4 MG/2ML IJ SOLN: 4.0000 mg | Freq: Four times a day (QID) | INTRAMUSCULAR | Status: DC | PRN

## 2014-11-29 MED ORDER — SODIUM CHLORIDE 0.9 % IJ SOLN
INTRAMUSCULAR | Status: AC
  Filled 2014-11-29: qty 10

## 2014-11-29 MED ORDER — HYDROMORPHONE 0.3 MG/ML IV SOLN
INTRAVENOUS | Status: DC
  Administered 2014-11-29: 1.5 mg via INTRAVENOUS
  Administered 2014-11-29 – 2014-11-30 (×2): via INTRAVENOUS
  Administered 2014-11-30: 0.3 mg via INTRAVENOUS
  Administered 2014-11-30: 0.6 mg via INTRAVENOUS
  Administered 2014-11-30: 1.2 mg via INTRAVENOUS
  Administered 2014-11-30: 3.9 mg via INTRAVENOUS
  Administered 2014-11-30: 1.5 mg via INTRAVENOUS
  Administered 2014-12-01: 0 mg via INTRAVENOUS
  Administered 2014-12-01: 0.3 mg via INTRAVENOUS
  Filled 2014-11-29: qty 25

## 2014-11-29 MED ORDER — FENTANYL CITRATE (PF) 100 MCG/2ML IJ SOLN
INTRAMUSCULAR | Status: DC | PRN
  Administered 2014-11-29 (×5): 50 ug via INTRAVENOUS

## 2014-11-29 MED ORDER — MIDAZOLAM HCL 5 MG/5ML IJ SOLN
INTRAMUSCULAR | Status: DC | PRN
  Administered 2014-11-29 (×2): 1 mg via INTRAVENOUS

## 2014-11-29 MED ORDER — PHENYLEPHRINE HCL 10 MG/ML IJ SOLN
INTRAMUSCULAR | Status: AC
  Filled 2014-11-29: qty 2

## 2014-11-29 MED ORDER — SUCCINYLCHOLINE CHLORIDE 20 MG/ML IJ SOLN
INTRAMUSCULAR | Status: DC | PRN
  Administered 2014-11-29: 100 mg via INTRAVENOUS

## 2014-11-29 MED ORDER — FAT EMULSION 20 % INFUSION TNA - OPTIME
INTRAVENOUS | Status: DC | PRN
  Administered 2014-11-29: 10 mL/h via INTRAVENOUS

## 2014-11-29 MED ORDER — NALOXONE HCL 0.4 MG/ML IJ SOLN: 0.4000 mg | INTRAMUSCULAR | Status: DC | PRN

## 2014-11-29 MED ORDER — PROPOFOL 10 MG/ML IV BOLUS
INTRAVENOUS | Status: AC
  Filled 2014-11-29: qty 20

## 2014-11-29 MED ORDER — BUPIVACAINE 0.25 % ON-Q PUMP DUAL CATH 300 ML
300.0000 mL | INJECTION | Status: DC
  Filled 2014-11-29: qty 300

## 2014-11-29 MED ORDER — PROPOFOL 10 MG/ML IV BOLUS
INTRAVENOUS | Status: DC | PRN
  Administered 2014-11-29: 150 mg via INTRAVENOUS

## 2014-11-29 MED ORDER — NEOSTIGMINE METHYLSULFATE 10 MG/10ML IV SOLN
INTRAVENOUS | Status: DC | PRN
  Administered 2014-11-29: 3.5 mg via INTRAVENOUS

## 2014-11-29 MED ORDER — ONDANSETRON HCL 4 MG/2ML IJ SOLN
INTRAMUSCULAR | Status: DC | PRN
  Administered 2014-11-29: 4 mg via INTRAVENOUS

## 2014-11-29 MED ORDER — FAT EMULSION 20 % IV EMUL
240.0000 mL | INTRAVENOUS | Status: AC
  Administered 2014-11-29: 240 mL via INTRAVENOUS
  Filled 2014-11-29: qty 250

## 2014-11-29 MED ORDER — BUPIVACAINE HCL (PF) 0.25 % IJ SOLN
INTRAMUSCULAR | Status: DC | PRN
  Administered 2014-11-29: 10 mL

## 2014-11-29 MED ORDER — SODIUM CHLORIDE 0.9 % IJ SOLN: 9.0000 mL | INTRAMUSCULAR | Status: DC | PRN

## 2014-11-29 MED ORDER — PHENYLEPHRINE 40 MCG/ML (10ML) SYRINGE FOR IV PUSH (FOR BLOOD PRESSURE SUPPORT)
PREFILLED_SYRINGE | INTRAVENOUS | Status: AC
  Filled 2014-11-29: qty 10

## 2014-11-29 MED ORDER — PHENYLEPHRINE HCL 10 MG/ML IJ SOLN
20.0000 mg | INTRAVENOUS | Status: DC | PRN
  Administered 2014-11-29: 25 ug/min via INTRAVENOUS

## 2014-11-29 MED ORDER — EPHEDRINE SULFATE 50 MG/ML IJ SOLN
INTRAMUSCULAR | Status: AC
  Filled 2014-11-29: qty 1

## 2014-11-29 MED ORDER — ROCURONIUM BROMIDE 100 MG/10ML IV SOLN
INTRAVENOUS | Status: AC
  Filled 2014-11-29: qty 1

## 2014-11-29 MED ORDER — HYDROMORPHONE HCL 1 MG/ML IJ SOLN
INTRAMUSCULAR | Status: DC | PRN
  Administered 2014-11-29: .4 mg via INTRAVENOUS
  Administered 2014-11-29 (×3): .2 mg via INTRAVENOUS

## 2014-11-29 MED ORDER — BUPIVACAINE-EPINEPHRINE (PF) 0.5% -1:200000 IJ SOLN
INTRAMUSCULAR | Status: AC
  Filled 2014-11-29: qty 30

## 2014-11-29 MED ORDER — HYDROMORPHONE 0.3 MG/ML IV SOLN
INTRAVENOUS | Status: AC
  Filled 2014-11-29: qty 25

## 2014-11-29 MED ORDER — FENTANYL CITRATE (PF) 250 MCG/5ML IJ SOLN
INTRAMUSCULAR | Status: AC
  Filled 2014-11-29: qty 25

## 2014-11-29 MED ORDER — MIDAZOLAM HCL 2 MG/2ML IJ SOLN
INTRAMUSCULAR | Status: AC
  Filled 2014-11-29: qty 4

## 2014-11-29 MED ORDER — HYDROMORPHONE HCL 1 MG/ML IJ SOLN
0.2500 mg | INTRAMUSCULAR | Status: DC | PRN
  Administered 2014-11-29 (×4): 0.5 mg via INTRAVENOUS

## 2014-11-29 MED ORDER — LIDOCAINE HCL (CARDIAC) 20 MG/ML IV SOLN
INTRAVENOUS | Status: DC | PRN
  Administered 2014-11-29: 80 mg via INTRAVENOUS

## 2014-11-29 MED ORDER — HYDROMORPHONE HCL 1 MG/ML IJ SOLN
INTRAMUSCULAR | Status: AC
  Filled 2014-11-29: qty 1

## 2014-11-29 MED ORDER — GLYCOPYRROLATE 0.2 MG/ML IJ SOLN
INTRAMUSCULAR | Status: DC | PRN
  Administered 2014-11-29: .4 mg via INTRAVENOUS

## 2014-11-29 MED ORDER — DIPHENHYDRAMINE HCL 50 MG/ML IJ SOLN
12.5000 mg | Freq: Four times a day (QID) | INTRAMUSCULAR | Status: DC | PRN
  Administered 2014-12-01: 12.5 mg via INTRAVENOUS
  Filled 2014-11-29: qty 1

## 2014-11-29 MED ORDER — NEOSTIGMINE METHYLSULFATE 10 MG/10ML IV SOLN
INTRAVENOUS | Status: AC
Start: 2014-11-29 — End: 2014-11-29
  Filled 2014-11-29: qty 1

## 2014-11-29 MED ORDER — METRONIDAZOLE IN NACL 5-0.79 MG/ML-% IV SOLN
INTRAVENOUS | Status: AC
  Filled 2014-11-29: qty 100

## 2014-11-29 MED ORDER — CEFAZOLIN SODIUM-DEXTROSE 2-3 GM-% IV SOLR
INTRAVENOUS | Status: AC
  Filled 2014-11-29: qty 50

## 2014-11-29 MED ORDER — GLYCOPYRROLATE 0.2 MG/ML IJ SOLN
INTRAMUSCULAR | Status: AC
  Filled 2014-11-29: qty 2

## 2014-11-29 SURGICAL SUPPLY — 56 items
APPLICATOR COTTON TIP 6IN STRL (MISCELLANEOUS) ×6 IMPLANT
BLADE EXTENDED COATED 6.5IN (ELECTRODE) IMPLANT
BLADE HEX COATED 2.75 (ELECTRODE) ×6 IMPLANT
BLADE SURG SZ10 CARB STEEL (BLADE) IMPLANT
CATH KIT ON-Q SILVERSOAK 7.5IN (CATHETERS) ×6 IMPLANT
COVER MAYO STAND STRL (DRAPES) ×3 IMPLANT
COVER SURGICAL LIGHT HANDLE (MISCELLANEOUS) ×3 IMPLANT
DRAIN CHANNEL 19F RND (DRAIN) ×3 IMPLANT
DRAPE LAPAROSCOPIC ABDOMINAL (DRAPES) ×3 IMPLANT
DRAPE SHEET LG 3/4 BI-LAMINATE (DRAPES) IMPLANT
DRAPE WARM FLUID 44X44 (DRAPE) ×3 IMPLANT
DRSG PAD ABDOMINAL 8X10 ST (GAUZE/BANDAGES/DRESSINGS) IMPLANT
ELECT REM PT RETURN 9FT ADLT (ELECTROSURGICAL) ×3
ELECTRODE REM PT RTRN 9FT ADLT (ELECTROSURGICAL) ×1 IMPLANT
EVACUATOR DRAINAGE 10X20 100CC (DRAIN) IMPLANT
EVACUATOR SILICONE 100CC (DRAIN) ×3 IMPLANT
GAUZE SPONGE 4X4 12PLY STRL (GAUZE/BANDAGES/DRESSINGS) ×3 IMPLANT
GLOVE BIO SURGEON STRL SZ 6 (GLOVE) ×9 IMPLANT
GLOVE BIOGEL PI IND STRL 6.5 (GLOVE) ×1 IMPLANT
GLOVE BIOGEL PI IND STRL 7.0 (GLOVE) ×1 IMPLANT
GLOVE BIOGEL PI INDICATOR 6.5 (GLOVE) ×2
GLOVE BIOGEL PI INDICATOR 7.0 (GLOVE) ×2
GLOVE INDICATOR 6.5 STRL GRN (GLOVE) ×6 IMPLANT
GOWN STRL REUS W/ TWL XL LVL3 (GOWN DISPOSABLE) ×3 IMPLANT
GOWN STRL REUS W/TWL 2XL LVL3 (GOWN DISPOSABLE) ×3 IMPLANT
GOWN STRL REUS W/TWL XL LVL3 (GOWN DISPOSABLE) ×6
KIT BASIN OR (CUSTOM PROCEDURE TRAY) ×3 IMPLANT
LEGGING LITHOTOMY PAIR STRL (DRAPES) IMPLANT
LIGASURE IMPACT 36 18CM CVD LR (INSTRUMENTS) ×3 IMPLANT
NDL SAFETY ECLIPSE 18X1.5 (NEEDLE) ×1 IMPLANT
NEEDLE HYPO 18GX1.5 SHARP (NEEDLE) ×2
NEEDLE SPNL 18GX3.5 QUINCKE PK (NEEDLE) ×3 IMPLANT
NS IRRIG 1000ML POUR BTL (IV SOLUTION) ×6 IMPLANT
PACK GENERAL/GYN (CUSTOM PROCEDURE TRAY) ×3 IMPLANT
RELOAD PROXIMATE 75MM BLUE (ENDOMECHANICALS) ×6 IMPLANT
SPONGE LAP 18X18 X RAY DECT (DISPOSABLE) ×3 IMPLANT
STAPLER GUN LINEAR PROX 60 (STAPLE) ×3 IMPLANT
STAPLER PROXIMATE 75MM BLUE (STAPLE) ×3 IMPLANT
STAPLER VISISTAT 35W (STAPLE) ×6 IMPLANT
SUCTION POOLE TIP (SUCTIONS) ×9 IMPLANT
SUT ETHILON 3 0 PS 1 (SUTURE) ×3 IMPLANT
SUT PDS AB 1 CTX 36 (SUTURE) IMPLANT
SUT PDS AB 1 TP1 96 (SUTURE) ×6 IMPLANT
SUT SILK 2 0 30  PSL (SUTURE) ×2
SUT SILK 2 0 30 PSL (SUTURE) ×1 IMPLANT
SUT VIC AB 2-0 SH 18 (SUTURE) ×6 IMPLANT
SUT VIC AB 3-0 SH 18 (SUTURE) ×9 IMPLANT
SUT VICRYL 2 0 18  UND BR (SUTURE) ×2
SUT VICRYL 2 0 18 UND BR (SUTURE) ×1 IMPLANT
SUT VICRYL 3 0 BR 18  UND (SUTURE) ×2
SUT VICRYL 3 0 BR 18 UND (SUTURE) ×1 IMPLANT
TOWEL OR 17X26 10 PK STRL BLUE (TOWEL DISPOSABLE) ×6 IMPLANT
TRAY FOLEY W/METER SILVER 14FR (SET/KITS/TRAYS/PACK) ×3 IMPLANT
TRAY FOLEY W/METER SILVER 16FR (SET/KITS/TRAYS/PACK) ×3 IMPLANT
TUNNELER SHEATH ON-Q 16GX12 DP (PAIN MANAGEMENT) ×3 IMPLANT
YANKAUER SUCT BULB TIP NO VENT (SUCTIONS) ×3 IMPLANT

## 2014-11-29 NOTE — Progress Notes (Signed)
PROGRESS NOTE  Darryl Griffith NWG:[id number] DOB: 11-30-1945 DOA: 11/24/2014 PCP: Gennette Pac, MD  HPI: Darryl Griffith is a 69 y.o. 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 General surgery was consulted on admission. Patient did not improve with conservative measures and is scheduled to undergo surgery today 8/31.  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 Gram stain negative.  - ID following  HTN - prn hydralazine - BP stable  Hypokalemia - stable  Hypomagnesemia - improved with repletion  Anemia - monitor CBC, likely of chronic illness, no bleeding apparent - will likely require transfusion post op   Diet: Diet NPO time specified Except for: Sips with Meds .TPN (CLINIMIX-E) Adult .TPN (CLINIMIX-E) Adult Fluids: NS DVT Prophylaxis: SCD  Code Status: Full Code Family  Communication: no family bedside  Disposition Plan: remain inpatient  Consultants:  General surgery   ID  Procedures:  None    Antibiotics  Anti-infectives    Start     Dose/Rate Route Frequency Ordered Stop   11/29/14 0830  [MAR Hold]  ceFAZolin (ANCEF) IVPB 2 g/50 mL premix     (MAR Hold since 11/29/14 0825)   2 g 100 mL/hr over 30 Minutes Intravenous On call to O.R. 11/28/14 0850 11/29/14 0908   11/25/14 0000  metroNIDAZOLE (FLAGYL) IVPB 500 mg     500 mg 100 mL/hr over 60 Minutes Intravenous Every 8 hours 11/24/14 2355     11/25/14 0000  linezolid (ZYVOX) IVPB 600 mg     600 mg 300 mL/hr over 60 Minutes Intravenous Every 12 hours 11/24/14 2355        Studies Dg Abd Portable 1v  11/28/2014   CLINICAL DATA:  Evaluate NG tube repositioning.  EXAM: PORTABLE ABDOMEN - 1 VIEW  COMPARISON:  11/27/2014  FINDINGS: The nasogastric tube has been repositioned. The tip is in the fundus of the stomach in the side port is below the GE junction. Surgical clips are noted in the left upper quadrant of the abdomen. Again noted are dilated loops of bowel measuring up to 5 cm.  IMPRESSION: 1. Nasogastric tube is in the stomach. 2. Persistent small bowel dilatation consistent with obstruction.   Electronically Signed   By: Kerby Moors M.D.   On: 11/28/2014 09:05   Objective  Filed Vitals:   11/29/14 1215 11/29/14 1230 11/29/14 1245 11/29/14 1311  BP: 136/75 112/60 113/63 116/72  Pulse: 98 98 98   Temp:   97.7  F (36.5 C) 97.6 F (36.4 C)  TempSrc:      Resp: 28 29 29 30   Height:      Weight:      SpO2: 100% 100% 100% 100%    Intake/Output Summary (Last 24 hours) at 11/29/14 1725 Last data filed at 11/29/14 1245  Gross per 24 hour  Intake 5387.17 ml  Output   1915 ml  Net 3472.17 ml   Filed Weights   11/24/14 2020  Weight: 73.936 kg (163 lb)   Exam:  GENERAL: NAD, NG tube in place  LUNGS: Clear to auscultation. No wheezing or crackles  HEART: Regular rate and rhythm  without murmur. 2+ pulses, no JVD, no peripheral edema  ABDOMEN: Soft, nontender, and nondistended. No bowel sounds.   EXTREMITIES: Without any cyanosis, clubbing, rash, lesions or edema.  Data Reviewed: Basic Metabolic Panel:  Recent Labs Lab 11/24/14 2130 11/25/14 0530 11/26/14 0418 11/27/14 0408 11/29/14 0440  NA 132* 132* 135 134* 135  K 3.2* 3.4* 3.3* 3.5 4.1  CL 99* 102 104 104 100*  CO2 20* 23 25 25 29   GLUCOSE 96 90 88 125* 127*  BUN 12 12 12 13 18   CREATININE 1.29* 1.14 1.14 1.03 0.91  CALCIUM 8.7* 8.4* 8.0* 8.0* 8.2*  MG  --  1.5* 1.9 1.7  --   PHOS  --   --   --  2.1*  --    Liver Function Tests:  Recent Labs Lab 11/24/14 2130 11/27/14 0408  AST 32 25  ALT 21 17  ALKPHOS 63 51  BILITOT 0.3 0.4  PROT 5.7* 5.0*  ALBUMIN 2.5* 2.3*    Recent Labs Lab 11/24/14 2130  LIPASE 47   CBC:  Recent Labs Lab 11/24/14 2130 11/25/14 0530 11/27/14 0408 11/29/14 0440  WBC 5.8 5.3 4.0 3.5*  NEUTROABS 4.7  --  3.1  --   HGB 8.6* 9.0* 7.8* 8.5*  HCT 26.7* 26.5* 24.7* 25.8*  MCV 80.7 80.1 81.0 80.6  PLT 447* 453* 366 352   Scheduled Meds: . sodium chloride   Intravenous Once  . bisacodyl  10 mg Rectal Daily  . heparin subcutaneous  5,000 Units Subcutaneous 3 times per day  . HYDROmorphone      . HYDROmorphone PCA 0.3 mg/mL   Intravenous 6 times per day  . HYDROmorphone PCA 0.3 mg/mL      . insulin aspart  0-9 Units Subcutaneous 4 times per day  . linezolid  600 mg Intravenous Q12H  . lip balm  1 application Topical BID  . metronidazole  500 mg Intravenous Q8H  . sodium chloride       Continuous Infusions: . Marland KitchenTPN (CLINIMIX-E) Adult 100 mL/hr (11/29/14 4481)   And  . fat emulsion 240 mL (11/28/14 1716)  . Marland KitchenTPN (CLINIMIX-E) Adult     And  . fat emulsion    . sodium chloride 10 mL/hr at 11/28/14 Augusto Garbe, MD [facility] Pager [phone]. If 7 PM - 7 AM, please contact night-coverage at [email or web address], password [facility] 11/29/2014,  5:25 PM  LOS: 5 days

## 2014-11-29 NOTE — H&P (View-Only) (Signed)
Patient ID: Darryl Griffith, male   DOB: 09-Apr-1945, 69 y.o.   MRN: [id number]     [facility]      [address] [place], [place] [address]    Phone: [phone] FAX: [phone]     Subjective: Having issues sleeping.    Objective:  Vital signs:  Filed Vitals:   11/27/14 1443 11/27/14 2033 11/28/14 0526 11/28/14 0819  BP: 134/82 154/79 139/78   Pulse: 89 84 85   Temp: 98.1 F (36.7 C) 98.5 F (36.9 C) 98.4 F (36.9 C)   TempSrc: Oral Oral Oral   Resp: 17 18 18    Height:      Weight:      SpO2:  100% 98% 99%    Last BM Date: 11/27/14  Intake/Output   Yesterday:  08/29 0701 - 08/30 0700 In: 9030 [I.V.:1065; NG/GT:240; IV Piggyback:900; SPQ:3300] Out: 4575 [Urine:2575; Emesis/NG output:2000] This shift:      Physical Exam: General: Pt awake/alert/oriented x4 in no acute distress Abdomen: Soft. Bilious output.  448m out. No evidence of peritonitis.  No incarcerated hernias.   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   Hypertension   Anemia of chronic disease   Abscess, retroperitoneal   Malnutrition of moderate degree   Hypokalemia    Results:   Labs: Results for orders placed or performed during the hospital encounter of 11/24/14 (from the past 48 hour(s))  Comprehensive metabolic panel     Status: Abnormal   Collection Time: 11/27/14  4:08 AM  Result Value Ref Range   Sodium 134 (L) 135 - 145 mmol/L   Potassium 3.5 3.5 - 5.1 mmol/L   Chloride 104 101 - 111 mmol/L   CO2 25 22 - 32 mmol/L   Glucose, Bld 125 (H) 65 - 99 mg/dL   BUN 13 6 - 20 mg/dL   Creatinine, Ser 1.03 0.61 - 1.24 mg/dL   Calcium 8.0 (L) 8.9 - 10.3 mg/dL   Total Protein 5.0 (L) 6.5 - 8.1 g/dL   Albumin 2.3 (L) 3.5 - 5.0 g/dL   AST 25 15 - 41 U/L   ALT 17 17 - 63  U/L   Alkaline Phosphatase 51 38 - 126 U/L   Total Bilirubin 0.4 0.3 - 1.2 mg/dL   GFR calc non Af Amer >60 >60 mL/min   GFR calc Af Amer >60 >60 mL/min    Comment: (NOTE) The eGFR has been calculated using the CKD EPI equation. This calculation has not been validated in all clinical situations. eGFR's persistently <60 mL/min signify possible Chronic Kidney Disease.    Anion gap 5 5 - 15  Magnesium     Status: None   Collection Time: 11/27/14  4:08 AM  Result Value Ref Range   Magnesium 1.7 1.7 - 2.4 mg/dL  Phosphorus     Status: Abnormal   Collection Time: 11/27/14  4:08 AM  Result Value Ref Range   Phosphorus 2.1 (L) 2.5 - 4.6 mg/dL  CBC     Status: Abnormal   Collection Time: 11/27/14  4:08 AM  Result Value Ref Range   WBC 4.0 4.0 - 10.5 K/uL   RBC 3.05 (L) 4.22 - 5.81 MIL/uL   Hemoglobin 7.8 (L) 13.0 - 17.0 g/dL   HCT 24.7 (L) 39.0 - 52.0 %   MCV  81.0 78.0 - 100.0 fL   MCH 25.6 (L) 26.0 - 34.0 pg   MCHC 31.6 30.0 - 36.0 g/dL   RDW 17.4 (H) 11.5 - 15.5 %   Platelets 366 150 - 400 K/uL  Differential     Status: Abnormal   Collection Time: 11/27/14  4:08 AM  Result Value Ref Range   Neutrophils Relative % 77 43 - 77 %   Neutro Abs 3.1 1.7 - 7.7 K/uL   Lymphocytes Relative 8 (L) 12 - 46 %   Lymphs Abs 0.3 (L) 0.7 - 4.0 K/uL   Monocytes Relative 13 (H) 3 - 12 %   Monocytes Absolute 0.5 0.1 - 1.0 K/uL   Eosinophils Relative 3 0 - 5 %   Eosinophils Absolute 0.1 0.0 - 0.7 K/uL   Basophils Relative 1 0 - 1 %   Basophils Absolute 0.0 0.0 - 0.1 K/uL  Triglycerides     Status: None   Collection Time: 11/27/14  4:08 AM  Result Value Ref Range   Triglycerides 132 <150 mg/dL    Comment: Performed at [facility]  Prealbumin     Status: Abnormal   Collection Time: 11/27/14  4:08 AM  Result Value Ref Range   Prealbumin 12.1 (L) 18 - 38 mg/dL    Comment: Performed at [place]     Status: Abnormal   Collection Time: 11/27/14  4:08 AM  Result  Value Ref Range   Prothrombin Time 16.1 (H) 11.6 - 15.2 seconds   INR 1.27 0.00 - 1.49  APTT     Status: None   Collection Time: 11/27/14  4:08 AM  Result Value Ref Range   aPTT 33 24 - 37 seconds  Culture, routine-abscess     Status: None (Preliminary result)   Collection Time: 11/27/14 12:22 PM  Result Value Ref Range   Specimen Description      ABSCESS ASPIRATION OF RECURRENT LEFT RETROPERITONEAL FLUID. Nephrectomy   Special Requests Normal    Gram Stain      ABUNDANT WBC PRESENT,BOTH PMN AND MONONUCLEAR NO SQUAMOUS EPITHELIAL CELLS SEEN NO ORGANISMS SEEN Performed at [facility]    Culture PENDING    Report Status PENDING   Anaerobic culture     Status: None (Preliminary result)   Collection Time: 11/27/14 12:22 PM  Result Value Ref Range   Specimen Description ABSCESS LEFT RETROPERITONEAL FLUID. Nephrectomy     Special Requests Normal    Gram Stain      ABUNDANT WBC PRESENT,BOTH PMN AND MONONUCLEAR NO SQUAMOUS EPITHELIAL CELLS SEEN NO ORGANISMS SEEN Performed at [facility]    Culture PENDING    Report Status PENDING   Glucose, capillary     Status: Abnormal   Collection Time: 11/27/14 12:39 PM  Result Value Ref Range   Glucose-Capillary 114 (H) 65 - 99 mg/dL   Comment 1 Notify RN   Glucose, capillary     Status: Abnormal   Collection Time: 11/27/14  5:24 PM  Result Value Ref Range   Glucose-Capillary 119 (H) 65 - 99 mg/dL   Comment 1 Notify RN   Glucose, capillary     Status: Abnormal   Collection Time: 11/27/14  8:38 PM  Result Value Ref Range   Glucose-Capillary 120 (H) 65 - 99 mg/dL  Glucose, capillary     Status: Abnormal   Collection Time: 11/27/14 11:34 PM  Result Value Ref Range   Glucose-Capillary 116 (H) 65 - 99 mg/dL  Glucose, capillary     Status: Abnormal   Collection Time: 11/28/14  5:14 AM  Result Value Ref Range   Glucose-Capillary 133 (H) 65 - 99 mg/dL    Imaging / Studies: Ct Aspiration  11/28/2014   CLINICAL DATA:   Recurrent left retroperitoneal fluid collection after previous resection of sarcoma. The patient now presents with a small bowel obstruction. Aspiration of the fluid collection has been requested prior to planned surgery to determine if there is any evidence of infection. This fluid collection was last aspirated on 11/10/2014 revealing vancomycin resistant Enterococcus and also previously contained a percutaneous drain for several months.  EXAM: CT GUIDED CATHETER DRAINAGE OF LEFT RETROPERITONEAL FLUID COLLECTION  ANESTHESIA/SEDATION: 1.5  Mg IV Versed; 75 mcg IV Fentanyl  Total Moderate Sedation Time: 15 minutes  PROCEDURE: The procedure risks, benefits, and alternatives were explained to the patient. Questions regarding the procedure were encouraged and answered. The patient understands and consents to the procedure. A time-out was performed prior to the procedure.  The left flank region was prepped with Betadine in a sterile fashion, and a sterile drape was applied covering the operative field. A sterile gown and sterile gloves were used for the procedure. Local anesthesia was provided with 1% Lidocaine.  Under CT guidance, a 5 [place] Yueh centesis catheter was advanced into the left retroperitoneal space. Fluid aspiration was performed through the 5 French catheter. The catheter was then removed. Additional CT was performed.  COMPLICATIONS: None  FINDINGS: Recurrent fluid collection in the medial left retroperitoneal space again identified, identical in appearance to scanning just prior to the previous aspiration procedure on 08/12. Approximately 10 mL of pink colored, turbid fluid was able to be aspirated. Material was sent for culture analysis. Postprocedural CT shows decrease in size of the fluid collection.  IMPRESSION: CT-guided aspiration of recurrent medial left retroperitoneal fluid collection yielding 10 mL of pink colored, turbid fluid. Fluid was sent for culture analysis.   Electronically Signed   By:  Aletta Edouard M.D.   On: 11/28/2014 08:21   Dg Abd 2 Views  11/27/2014   CLINICAL DATA:  Followup small bowel obstruction  EXAM: ABDOMEN - 2 VIEW  COMPARISON:  11/26/2014  FINDINGS: The nasogastric tube is identified with tip in the stomach. The side port is below the GE junction. Dilated loops of small bowel and air-fluid levels are again noted. These measure up to 6.7 cm. Enteric contrast material is identified within the colon up to the level of the rectum.  IMPRESSION: 1. NG tube is within the stomach. 2. Persistent partial small bowel obstruction 3. Enteric contrast material is identified up the level of the rectum.   Electronically Signed   By: Kerby Moors M.D.   On: 11/27/2014 09:35   Dg Abd 2 Views  11/26/2014   CLINICAL DATA:  Abdominal distention with nausea and vomiting. Surgery in April. Small bowel obstruction symptoms.  EXAM: ABDOMEN - 2 VIEW  COMPARISON:  11/24/2014  FINDINGS: Upright and supine views. Upright view demonstrates persistent small bowel air-fluid levels, increased. Small left pleural effusion. There may be a nasogastric tube looped in the esophagus, incompletely imaged. Surgical clips in the upper abdomen. A metallic object projecting over the a lower chest is presumably external to the patient.  Supine images demonstrate small bowel distension. Example loop measures 6.7 cm today, versus 6.4 cm on the prior. Contrast is identified within the colon. No pneumatosis.  IMPRESSION: Similar small bowel obstruction pattern, without free intraperitoneal air or  other acute complication.  The nasogastric may be looped in the distal esophagus. Incompletely imaged. These results will be called to the ordering clinician or representative by the Radiologist Assistant, and communication documented in the PACS or zVision Dashboard.   Electronically Signed   By: Abigail Miyamoto M.D.   On: 11/26/2014 10:07   Dg Abd Portable 1v  11/26/2014   CLINICAL DATA:  Nasogastric tube placement  EXAM:  PORTABLE ABDOMEN - 1 VIEW  COMPARISON:  Portable exam 1318 hours compared to 11/26/2014  FINDINGS: Tip of nasogastric tube projects over proximal stomach.  Numerous surgical clips in the perigastric region.  Multiple dilated loops of small bowel again identified compatible with small bowel obstruction.  Small amount of contrast within a decompressed colon.  No definite bowel wall thickening.  Lung bases clear.  IMPRESSION: Small bowel obstruction.  Tip of nasogastric tube projects over proximal stomach.   Electronically Signed   By: Lavonia Dana M.D.   On: 11/26/2014 16:49    Medications / Allergies:  Scheduled Meds: . sodium chloride   Intravenous Once  . bisacodyl  10 mg Rectal Daily  . heparin subcutaneous  5,000 Units Subcutaneous 3 times per day  . insulin aspart  0-9 Units Subcutaneous 4 times per day  . linezolid  600 mg Intravenous Q12H  . lip balm  1 application Topical BID  . metronidazole  500 mg Intravenous Q8H   Continuous Infusions: . Marland KitchenTPN (CLINIMIX-E) Adult 70 mL/hr at 11/27/14 1809   And  . fat emulsion 240 mL (11/27/14 1810)  . sodium chloride 45 mL/hr at 11/27/14 1923   PRN Meds:.acetaminophen **OR** acetaminophen, alum & mag hydroxide-simeth, diphenhydrAMINE, HYDROmorphone (DILAUDID) injection, LORazepam, magic mouthwash, menthol-cetylpyridinium, metoprolol, ondansetron **OR** ondansetron (ZOFRAN) IV, phenol, promethazine, sodium chloride  Antibiotics: Anti-infectives    Start     Dose/Rate Route Frequency Ordered Stop   11/25/14 0000  metroNIDAZOLE (FLAGYL) IVPB 500 mg     500 mg 100 mL/hr over 60 Minutes Intravenous Every 8 hours 11/24/14 2355     11/25/14 0000  linezolid (ZYVOX) IVPB 600 mg     600 mg 300 mL/hr over 60 Minutes Intravenous Every 12 hours 11/24/14 2355          Assessment/Plan left retroperitoneal liposarcoma s/p resection and left nephrectomy 07/13/14 s/p XRT---Dr. Barry Dienes  Post op intra-abdominal abscesses(VRE) Recurrent SBO-NGT, NPO, TPN.     ID-linezolid/flagyl for VRE.  IR perc aspiration yesterday.  cx pending.    PCM-prealbumin 12, TPN.  Acute on chronic anemia-h&h 7.8/24.7. Have type and crossed for surgery Dispo-OR tomorrow     11/28/2014  8:48 AM

## 2014-11-29 NOTE — Transfer of Care (Signed)
Immediate Anesthesia Transfer of Care Note  Patient: Darryl Griffith  Procedure(s) Performed: Procedure(s): EXPLORATORY LAPAROTOMY SMALL BOWEL RESECTION (N/A) IRRIGATION AND DEBRIDEMENT BACK ABSCESS (N/A)  Patient Location: PACU  Anesthesia Type:General  Level of Consciousness: awake, alert , oriented and patient cooperative  Airway & Oxygen Therapy: Patient Spontanous Breathing and Patient connected to face mask oxygen  Post-op Assessment: Report given to RN, Post -op Vital signs reviewed and stable and Patient moving all extremities  Post vital signs: Reviewed and stable  Last Vitals:  Filed Vitals:   11/29/14 0513  BP: 135/80  Pulse: 91  Temp: 36.9 C  Resp: 18    Complications: No apparent anesthesia complications

## 2014-11-29 NOTE — Anesthesia Procedure Notes (Signed)
Procedure Name: Intubation Date/Time: 11/29/2014 9:07 AM Performed by: Carleene Cooper A Pre-anesthesia Checklist: Patient identified, Emergency Drugs available, Suction available, Patient being monitored and Timeout performed Patient Re-evaluated:Patient Re-evaluated prior to inductionOxygen Delivery Method: Circle system utilized Preoxygenation: Pre-oxygenation with 100% oxygen (RSI with cricoid pressure by Dr. Landry Dyke.) Intubation Type: Rapid sequence, Cricoid Pressure applied and IV induction Laryngoscope Size: Mac and 4 Grade View: Grade I Tube type: Oral Number of attempts: 1 Airway Equipment and Method: Stylet Placement Confirmation: ETT inserted through vocal cords under direct vision,  breath sounds checked- equal and bilateral and positive ETCO2 Secured at: 22 cm Tube secured with: Tape Dental Injury: Teeth and Oropharynx as per pre-operative assessment

## 2014-11-29 NOTE — Anesthesia Postprocedure Evaluation (Signed)
  Anesthesia Post-op Note  Patient: Darryl Griffith  Procedure(s) Performed: Procedure(s) (LRB): EXPLORATORY LAPAROTOMY SMALL BOWEL RESECTION (N/A) IRRIGATION AND DEBRIDEMENT BACK ABSCESS (N/A)  Patient Location: PACU  Anesthesia Type: General  Level of Consciousness: awake and alert   Airway and Oxygen Therapy: Patient Spontanous Breathing  Post-op Pain: mild  Post-op Assessment: Post-op Vital signs reviewed, Patient's Cardiovascular Status Stable, Respiratory Function Stable, Patent Airway and No signs of Nausea or vomiting  Last Vitals:  Filed Vitals:   11/29/14 1311  BP: 116/72  Pulse:   Temp: 36.4 C  Resp: 30    Post-op Vital Signs: stable   Complications: No apparent anesthesia complications

## 2014-11-29 NOTE — Op Note (Signed)
PRE-OPERATIVE DIAGNOSIS: small bowel obstruction, chronic retroperitoneal abscess, left back abscess  POST-OPERATIVE DIAGNOSIS:  Same  PROCEDURE:  Procedure(s): Incision and drainage left back abscess, exploratory laparotomy, lysis of adhesions, small bowel resection, placement of drain into left retroperitoneal abscess  SURGEON:  Surgeon(s): Stark Klein, MD  Assistant:   Neysa Bonito, MD  ANESTHESIA:   general  DRAINS: (8 Fr) Blake drain(s) in the left retroperitoneum and OnQ pain pump   LOCAL MEDICATIONS USED:  BUPIVICAINE   SPECIMEN:  Source of Specimen:  small bowel resection - jejunum, culture retroperitoneal fluid collection, cytology retroperitoneal fluid collection, jejunal mesenteric nodule, cuture back abscess  DISPOSITION OF SPECIMEN:  pathology except for cutures to microbiology  COUNTS:  YES  DICTATION: .Dragon Dictation  PLAN OF CARE: Admit to inpatient   PATIENT DISPOSITION:  PACU - hemodynamically stable.  FINDINGS:  Adhesions of small bowel.  Dilated proximal small bowel, decompressed distal small bowel, transition point at retroperitoneal fluid collection.  Several nodules on mesentery.  Very firm tissue at retroperitoneal fluid collection.    EBL: 50 mL  PROCEDURE:  Patient was identified in the holding area and taken to the operating room where he was placed supine on operating room table. General endotracheal anesthesia was induced.  Timeout was performed according to the surgical safety checklist. When all was correct, we continued. His back abscess was addressed first. He was tilted on his side and the left back area was prepped.  The superficial portion was opened. There was some purulent drainage. The chronically inflamed tissue was debrided. This was packed with half-inch iodoform gauze.  The skin was cleaned and dried with gauze and tape.  The patient was placed back supine and the abdomen was prepped and draped in sterile fashion. The old incision  was opened with a #10 blade. The subcutaneous tissues were opened with the cautery.  The fascia was entered sharply in the epigastric region. Fascia was elevated and care was taken as the fascia was opened to avoid the underlying bowel. There were no significant adhesions of the bowel to the posterior abdominal wall at the midline. The omentum and its adhesions were taken off the abdominal wall in the left upper quadrant.   The Bookwalter retractor was set up to assist with visualization.  The small bowel was extremely dilated proximally. The adhesions were lysed systematically. Areas of serosal weakness were reinforced with 3-0 Vicryl pops. The transition point was located in the left upper quadrant in the retroperitoneum. This was at the inferior aspect of the fluid collection. The area where the chronic fluid collection has been was noted to be extremely firm.  The portion that was adherent was resected as the bowel was too adherent to the retroperitoneum to resect intact. The proximal small bowel was decompressed with the pool tip suction.  The bowel was divided with the GIA 75 stapler distal to the defect, and proximal to the defect.  There were some small nodules on the jejunal mesentery which were also biopsied.   The small bowel was then reanastamosed in side-to-side, functional end-to-end fashion.  Two apex sutures were placed in order to minimize tension on the anastamosis.   The mesenteric defect was closed with 2-0 vicryl interrupted sutures.  The bowel was then run carefully to examine for any other areas of serosal weakness after lysing all adhesions.  No additional areas were seen.    A syringe and 18 g spinal needle was used to access the retroperitoneal cavity.  Some bloody  fluid returned.  This was opened with a Vanderbilt clamp.  The area was quite firm and the cavity was small.  The abdomen was then irrigated.  An omental pedical flap was created with the ligasure.  The 59 Fr Blake drain  was placed into the cavity of the chronic abscess.  The omental flap was placed over the drain insertion site to decrease chance of small bowel sticking back down to this site.    The bookwalter was removed.  The lap count was then performed and was correct.  The drain was secured with a 2-0 ethibond suture. The OnQ tunneler catheters were placed on either side of the incision in the preperitoneal space.  The fascia was closed with running #1 PDS sutures. The skin was irrigated and closed with skin staples.  The OnQ catheters were advanced through the tunneler sheaths and secured to the skin with steristrips and tegaderm.  The skin was dressed with a honeycomb dressing.    Needle, sponge and instrument counts were correct x 2.  The patient was allowed to emerge from anesthesia, was extubated, and taken to the PACU in stable condition.

## 2014-11-29 NOTE — Progress Notes (Signed)
PARENTERAL NUTRITION CONSULT NOTE - INITIAL  Pharmacy Consult for TPN Indication: SBO  Allergies  Allergen Reactions  . Compazine [Prochlorperazine] Other (See Comments)    Made pt really lethargic, and sleepy   Patient Measurements: Height: 5\' 8"  (172.7 cm) Weight: 163 lb (73.936 kg) IBW/kg (Calculated) : 68.4  Vital Signs: Temp: 98.4 F (36.9 C) (08/31 0513) Temp Source: Oral (08/31 0513) BP: 135/80 mmHg (08/31 0513) Pulse Rate: 91 (08/31 0513) Intake/Output from previous day: 08/30 0701 - 08/31 0700 In: 4182.6 [I.V.:379.1; NG/GT:120; IV Piggyback:900; TPN:2063.5] Out: 3150 [Urine:1600; Emesis/NG output:1550] Intake/Output from this shift:    Labs:  Recent Labs  11/27/14 0408 11/29/14 0440  WBC 4.0 3.5*  HGB 7.8* 8.5*  HCT 24.7* 25.8*  PLT 366 352  APTT 33  --   INR 1.27  --     Recent Labs  11/27/14 0408 11/29/14 0440  NA 134* 135  K 3.5 4.1  CL 104 100*  CO2 25 29  GLUCOSE 125* 127*  BUN 13 18  CREATININE 1.03 0.91  CALCIUM 8.0* 8.2*  MG 1.7  --   PHOS 2.1*  --   PROT 5.0*  --   ALBUMIN 2.3*  --   AST 25  --   ALT 17  --   ALKPHOS 51  --   BILITOT 0.4  --   PREALBUMIN 12.1*  --   TRIG 132  --    Estimated Creatinine Clearance: 73.1 mL/min (by C-G formula based on Cr of 0.91).    Recent Labs  11/28/14 1819 11/28/14 2324 11/29/14 0517  GLUCAP 145* 117* 133*    Medical History: Past Medical History  Diagnosis Date  . Hypertension   . Anginal pain     hx of CP saw Dr Wynonia Lawman 09/20/2013 H&P per chart   . Left Retroperitoneal mass s/p resection/L nephrectomy 07/13/2014   . Postoperative anemia due to acute blood loss 07/15/2014  . SBO (small bowel obstruction) 08/01/2014, 10/11/14  . Retroperitoneal fluid collection   . 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 Impaired fasting glucose 11/25/2014  . Benign neoplasm of colon 11/25/2014  . Actinic keratosis  11/25/2014  . Mixed hyperlipidemia 11/25/2014   Medications:  Scheduled:  . sodium chloride   Intravenous Once  . bisacodyl  10 mg Rectal Daily  .  ceFAZolin (ANCEF) IV  2 g Intravenous On Call to OR  . heparin subcutaneous  5,000 Units Subcutaneous 3 times per day  . insulin aspart  0-9 Units Subcutaneous 4 times per day  . linezolid  600 mg Intravenous Q12H  . lip balm  1 application Topical BID  . metronidazole  500 mg Intravenous Q8H   Infusions:  . Marland KitchenTPN (CLINIMIX-E) Adult 100 mL/hr at 11/28/14 1716   And  . fat emulsion 240 mL (11/28/14 1716)  . sodium chloride 10 mL/hr at 11/28/14 1822   Insulin Requirements: 3 units over the past 24 hours (on sensitive SSI q6h)  Current Nutrition: NPO - NG tube   IVF: [facility] at [facility] access: PICC placed 10/12/14 TPN start date: 8/28  ASSESSMENT  HPI:  75 yoM s/p resection L retroperitoneal sarcoma, L nephrectomy 07/13/14 developed VRE abscess treated with po Linezolid/Flagyl. Hx abd bloating, N/V 2 days PTA, SBO seen on CT for Ca restaging.  Hx recurrent SBO. Begin TPN per Pharmacy  Significant events:   8/29: plan for exp laparotomy on 8/31; s/p aspiration of L retroperitoneal fluid collection on 8/29 8/31:  OR today  Today:   Glucose - w/in goal range  <150  Electrolytes - lytes wnl  Renal - wnl  LFTs - wnl  TGs - pending  Prealbumin - pending  NUTRITIONAL GOALS                                                                                             RD recs:   Kcal: 2300-2500  Protein: 110-120 grams  Fluid: 2.2-2.5 L/day  Clinimix E 5/15 at a goal rate of 119ml/hr + 20% fat emulsion at 10 ml/hr to provide: 120 g/day protein, 2184 Kcal/day +++ if patient tolerates clinimix E 5/15 at goal rate of 100 ml/hr and CBGs are within goal range, then will change to clinimix E 5/20 at goal rate of 90 ml/hr to obtain  goal protein 108 g/day and total Kcal 2380 +++  PLAN                                                                                                                          At 1800 today:  continue Clinimix E 5/15 rate to 100 ml/hr (goal rate)  20% fat emulsion at 10 ml/hr.  TPN to contain standard multivitamins and trace elements.  Reduce IVF to  [facility].  continue sensitive SSI q6h  TPN lab panels on Mondays & Thursdays.  F/u daily.  Dia Sitter, PharmD, BCPS 11/29/2014 7:06 AM

## 2014-11-29 NOTE — Interval H&P Note (Signed)
History and Physical Interval Note:  11/29/2014 8:00 AM  Darryl Griffith  has presented today for surgery, with the diagnosis of small bowel obstruction and back abscess.  The various methods of treatment have been discussed with the patient and family. After consideration of risks, benefits and other options for treatment, the patient has consented to  Procedure(s): EXPLORATORY LAPAROTOMY POSSIBLE SMALL BOWEL RESECTION (N/A) LAPAROSCOPIC LYSIS OF ADHESIONS (N/A) IRRIGATION AND DEBRIDEMENT BACK ABSCESS (N/A) as a surgical intervention .  The patient's history has been reviewed, patient examined, no change in status, stable for surgery.  I have reviewed the patient's chart and labs.  Questions were answered to the patient's satisfaction.     Buckley Bradly

## 2014-11-30 ENCOUNTER — Encounter (HOSPITAL_COMMUNITY): Payer: Self-pay | Admitting: General Surgery

## 2014-11-30 LAB — COMPREHENSIVE METABOLIC PANEL
ALT: 10 U/L — ABNORMAL LOW (ref 17–63)
AST: 14 U/L — ABNORMAL LOW (ref 15–41)
Albumin: 1.8 g/dL — ABNORMAL LOW (ref 3.5–5.0)
Alkaline Phosphatase: 40 U/L (ref 38–126)
Anion gap: 5 (ref 5–15)
BUN: 31 mg/dL — ABNORMAL HIGH (ref 6–20)
CO2: 26 mmol/L (ref 22–32)
Calcium: 7.6 mg/dL — ABNORMAL LOW (ref 8.9–10.3)
Chloride: 99 mmol/L — ABNORMAL LOW (ref 101–111)
Creatinine, Ser: 1.13 mg/dL (ref 0.61–1.24)
GFR calc Af Amer: >60 mL/min (ref >60)
GFR calc non Af Amer: >60 mL/min (ref >60)
Glucose, Bld: 172 mg/dL — ABNORMAL HIGH (ref 65–99)
Potassium: 4.4 mmol/L (ref 3.5–5.1)
Sodium: 130 mmol/L — ABNORMAL LOW (ref 135–145)
Total Bilirubin: 0.3 mg/dL (ref 0.3–1.2)
Total Protein: 4.4 g/dL — ABNORMAL LOW (ref 6.5–8.1)

## 2014-11-30 LAB — CULTURE, ROUTINE-ABSCESS
Culture: NO GROWTH
Special Requests: NORMAL

## 2014-11-30 LAB — CBC
HEMATOCRIT: 25.4 % — AB (ref 39.0–52.0)
HEMOGLOBIN: 8.4 g/dL — AB (ref 13.0–17.0)
MCH: 26.7 pg (ref 26.0–34.0)
MCHC: 33.1 g/dL (ref 30.0–36.0)
MCV: 80.6 fL (ref 78.0–100.0)
Platelets: 303 10*3/uL (ref 150–400)
RBC: 3.15 MIL/uL — AB (ref 4.22–5.81)
RDW: 17.4 % — ABNORMAL HIGH (ref 11.5–15.5)
WBC: 3.5 10*3/uL — AB (ref 4.0–10.5)

## 2014-11-30 LAB — GLUCOSE, CAPILLARY
GLUCOSE-CAPILLARY: 148 mg/dL — AB (ref 65–99)
GLUCOSE-CAPILLARY: 146 mg/dL — AB (ref 65–99)
Glucose-Capillary: 159 mg/dL — ABNORMAL HIGH (ref 65–99)
Glucose-Capillary: 151 mg/dL — ABNORMAL HIGH (ref 65–99)

## 2014-11-30 LAB — MAGNESIUM: Magnesium: 1.8 mg/dL (ref 1.7–2.4)

## 2014-11-30 LAB — PHOSPHORUS: Phosphorus: 3.4 mg/dL (ref 2.5–4.6)

## 2014-11-30 MED ORDER — MAGNESIUM SULFATE IN D5W 10-5 MG/ML-% IV SOLN
1.0000 g | Freq: Once | INTRAVENOUS | Status: AC
  Administered 2014-11-30: 1 g via INTRAVENOUS
  Filled 2014-11-30 (×2): qty 100

## 2014-11-30 MED ORDER — ACETAMINOPHEN 10 MG/ML IV SOLN
1000.0000 mg | Freq: Four times a day (QID) | INTRAVENOUS | Status: AC
  Administered 2014-11-30 – 2014-12-01 (×4): 1000 mg via INTRAVENOUS
  Filled 2014-11-30 (×4): qty 100

## 2014-11-30 MED ORDER — TRACE MINERALS CR-CU-MN-SE-ZN 10-1000-500-60 MCG/ML IV SOLN
INTRAVENOUS | Status: AC
  Administered 2014-11-30: 17:00:00 via INTRAVENOUS
  Filled 2014-11-30: qty 2400

## 2014-11-30 MED ORDER — HYDROMORPHONE HCL 1 MG/ML IJ SOLN
0.5000 mg | INTRAMUSCULAR | Status: DC | PRN
  Administered 2014-12-01: 2 mg via INTRAVENOUS
  Administered 2014-12-02: 1 mg via INTRAVENOUS
  Administered 2014-12-10: 2 mg via INTRAVENOUS
  Filled 2014-11-30 (×2): qty 2
  Filled 2014-11-30: qty 1

## 2014-11-30 MED ORDER — ALBUMIN HUMAN 25 % IV SOLN
25.0000 g | Freq: Four times a day (QID) | INTRAVENOUS | Status: AC
  Administered 2014-11-30 – 2014-12-02 (×7): 25 g via INTRAVENOUS
  Filled 2014-11-30 (×9): qty 100

## 2014-11-30 MED ORDER — SODIUM CHLORIDE 0.9 % IV BOLUS (SEPSIS)
500.0000 mL | Freq: Once | INTRAVENOUS | Status: AC
  Administered 2014-11-30: 500 mL via INTRAVENOUS

## 2014-11-30 MED ORDER — INSULIN ASPART 100 UNIT/ML ~~LOC~~ SOLN
0.0000 [IU] | Freq: Four times a day (QID) | SUBCUTANEOUS | Status: DC
  Administered 2014-11-30: 2 [IU] via SUBCUTANEOUS
  Administered 2014-11-30 – 2014-12-02 (×7): 1 [IU] via SUBCUTANEOUS
  Administered 2014-12-02: 2 [IU] via SUBCUTANEOUS
  Administered 2014-12-03 – 2014-12-06 (×12): 1 [IU] via SUBCUTANEOUS
  Administered 2014-12-06: 2 [IU] via SUBCUTANEOUS
  Administered 2014-12-06: 1 [IU] via SUBCUTANEOUS
  Administered 2014-12-07: 2 [IU] via SUBCUTANEOUS
  Administered 2014-12-07: 1 [IU] via SUBCUTANEOUS
  Administered 2014-12-08: 2 [IU] via SUBCUTANEOUS
  Administered 2014-12-08 (×3): 1 [IU] via SUBCUTANEOUS
  Administered 2014-12-09 – 2014-12-10 (×5): 2 [IU] via SUBCUTANEOUS
  Administered 2014-12-10 (×2): 1 [IU] via SUBCUTANEOUS
  Administered 2014-12-10 – 2014-12-11 (×2): 2 [IU] via SUBCUTANEOUS
  Administered 2014-12-11: 1 [IU] via SUBCUTANEOUS
  Administered 2014-12-11 – 2014-12-12 (×3): 2 [IU] via SUBCUTANEOUS
  Administered 2014-12-12: 1 [IU] via SUBCUTANEOUS

## 2014-11-30 MED ORDER — SODIUM CHLORIDE 0.9 % IV BOLUS (SEPSIS)
1000.0000 mL | Freq: Once | INTRAVENOUS | Status: AC
  Administered 2014-11-30: 1000 mL via INTRAVENOUS

## 2014-11-30 MED ORDER — FAT EMULSION 20 % IV EMUL
240.0000 mL | INTRAVENOUS | Status: AC
  Administered 2014-11-30: 240 mL via INTRAVENOUS
  Filled 2014-11-30: qty 250

## 2014-11-30 MED ORDER — METHOCARBAMOL 1000 MG/10ML IJ SOLN
500.0000 mg | Freq: Three times a day (TID) | INTRAVENOUS | Status: DC
  Administered 2014-11-30 – 2014-12-11 (×33): 500 mg via INTRAVENOUS
  Filled 2014-11-30 (×34): qty 5

## 2014-11-30 MED ORDER — INSULIN ASPART 100 UNIT/ML ~~LOC~~ SOLN
0.0000 [IU] | SUBCUTANEOUS | Status: DC
Start: 2014-11-30 — End: 2014-11-30

## 2014-11-30 NOTE — Progress Notes (Signed)
Patient ID: Darryl Griffith, male   DOB: January 11, 1946, 69 y.o.   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 sodium chloride   Intravenous Once  . acetaminophen  1,000 mg Intravenous 4 times per day  . albumin human  25 g Intravenous Q6H  . bisacodyl  10 mg Rectal Daily  . heparin subcutaneous  5,000 Units Subcutaneous 3 times per day  . HYDROmorphone PCA 0.3 mg/mL   Intravenous 6 times per day  . insulin aspart  0-9 Units Subcutaneous 4 times per day  . linezolid  600 mg Intravenous Q12H  . lip balm  1 application Topical BID  . methocarbamol (ROBAXIN)  IV  500 mg Intravenous 3 times per day  . metronidazole  500 mg Intravenous Q8H     Past Medical History  Diagnosis Date  . Hypertension   . Anginal pain     hx of CP saw Dr Wynonia Lawman 09/20/2013 H&P per chart   . Left Retroperitoneal mass s/p resection/L nephrectomy 07/13/2014   . Postoperative anemia due to acute blood loss 07/15/2014  . SBO (small bowel obstruction) 08/01/2014, 10/11/14  . Retroperitoneal fluid collection   . 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 Impaired fasting glucose 11/25/2014  . Benign neoplasm of colon 11/25/2014  . Actinic keratosis 11/25/2014  . Mixed hyperlipidemia 11/25/2014    Social History  Substance Use Topics  . Smoking status: Never Smoker   . Smokeless tobacco: Never Used  . Alcohol Use: No    Family History  Problem Relation Age of Onset  . Adopted:  Yes  . Family history unknown: Yes   Allergies  Allergen Reactions  . Compazine [Prochlorperazine] Other (See Comments)    Made pt really lethargic, and sleepy    OBJECTIVE: Filed Vitals:   11/30/14 0400 11/30/14 0457 11/30/14 0748 11/30/14 1339  BP:  99/62  97/60  Pulse:  125  113  Temp:  98.8 F (37.1 C)  98.2 F (36.8 C)  TempSrc:  Oral  Oral  Resp: 26 33 41 28  Height:      Weight:      SpO2: 100% 100% 100% 100%   Body mass index is 24.79 kg/(m^2).  General: He was sound asleep. I did not wake him.  Lab Results Lab Results  Component Value Date   WBC 3.5* 11/30/2014   HGB 8.4* 11/30/2014   HCT 25.4* 11/30/2014   MCV 80.6 11/30/2014   PLT 303 11/30/2014    Lab Results  Component Value Date   CREATININE 1.13 11/30/2014   BUN 31* 11/30/2014   NA 130* 11/30/2014   K 4.4 11/30/2014   CL 99* 11/30/2014   CO2 26 11/30/2014    Lab Results  Component Value Date   ALT 10* 11/30/2014   AST 14* 11/30/2014   ALKPHOS  40 11/30/2014   BILITOT 0.3 11/30/2014     Microbiology: Recent Results (from the past 240 hour(s))  Culture, routine-abscess     Status: None   Collection Time: 11/27/14 12:22 PM  Result Value Ref Range Status   Specimen Description   Final    ABSCESS ASPIRATION OF RECURRENT LEFT RETROPERITONEAL FLUID. Nephrectomy   Special Requests Normal  Final   Gram Stain   Final    ABUNDANT WBC PRESENT,BOTH PMN AND MONONUCLEAR NO SQUAMOUS EPITHELIAL CELLS SEEN NO ORGANISMS SEEN Performed at [facility]    Culture   Final    NO GROWTH 3 DAYS Performed at [facility]    Report Status 11/30/2014 FINAL  Final  Anaerobic culture     Status: None (Preliminary result)   Collection Time: 11/27/14 12:22 PM  Result Value Ref Range Status   Specimen Description ABSCESS LEFT RETROPERITONEAL FLUID. Nephrectomy   Final   Special Requests Normal  Final   Gram Stain   Final    ABUNDANT WBC PRESENT,BOTH PMN AND MONONUCLEAR NO SQUAMOUS EPITHELIAL  CELLS SEEN NO ORGANISMS SEEN Performed at [facility]    Culture   Final    NO ANAEROBES ISOLATED; CULTURE IN PROGRESS FOR 5 DAYS Performed at [facility]    Report Status PENDING  Incomplete  Fungus culture w smear     Status: None (Preliminary result)   Collection Time: 11/27/14 12:22 PM  Result Value Ref Range Status   Specimen Description ABSCESS LEFT RETROPERITONEAL FLUID/Nephrectomy  Final   Special Requests Normal  Final   Fungal Smear   Final    NO YEAST OR FUNGAL ELEMENTS SEEN Performed at [facility]    Culture   Final    CULTURE IN PROGRESS FOR FOUR WEEKS Performed at [facility]    Report Status PENDING  Incomplete  Surgical pcr screen     Status: None   Collection Time: 11/29/14  6:02 AM  Result Value Ref Range Status   MRSA, PCR NEGATIVE NEGATIVE Final   Staphylococcus aureus NEGATIVE NEGATIVE Final    Comment:        The Xpert SA Assay (FDA approved for NASAL specimens in patients over 48 years of age), is one component of a comprehensive surveillance program.  Test performance has been validated by [facility] for patients greater than or equal to 69 year old. It is not intended to diagnose infection nor to guide or monitor treatment.   Culture, routine-abscess     Status: None (Preliminary result)   Collection Time: 11/29/14 10:23 AM  Result Value Ref Range Status   Specimen Description ABSCESS BACK  Final   Special Requests NONE  Final   Gram Stain   Final    RARE WBC PRESENT, PREDOMINANTLY PMN NO SQUAMOUS EPITHELIAL CELLS SEEN NO ORGANISMS SEEN Performed at [facility]    Culture   Final    NO GROWTH 1 DAY Performed at [facility]    Report Status PENDING  Incomplete  Anaerobic culture     Status: None (Preliminary result)   Collection Time: 11/29/14 10:25 AM  Result Value Ref Range Status   Specimen Description ABSCESS BACK  Final   Special Requests NONE  Final   Gram Stain   Final      RARE WBC PRESENT, PREDOMINANTLY PMN NO SQUAMOUS EPITHELIAL CELLS SEEN NO ORGANISMS SEEN Performed at [facility]   Final  NO ANAEROBES ISOLATED; CULTURE IN PROGRESS FOR 5 DAYS Performed at [facility]    Report Status PENDING  Incomplete  Body fluid culture     Status: None (Preliminary result)   Collection Time: 11/29/14 11:42 AM  Result Value Ref Range Status   Specimen Description FLUID PERITONEAL LEFT PERITONEAL FLUID  Final   Special Requests NONE  Final   Gram Stain   Final    FEW WBC PRESENT, PREDOMINANTLY PMN NO ORGANISMS SEEN    Culture   Final    NO GROWTH < 24 HOURS Performed at [facility]    Report Status PENDING  Incomplete  Anaerobic culture     Status: None (Preliminary result)   Collection Time: 11/29/14 11:42 AM  Result Value Ref Range Status   Specimen Description FLUID PERITONEAL  Final   Special Requests NONE  Final   Gram Stain   Final    FEW WBC PRESENT, PREDOMINANTLY PMN NO ORGANISMS SEEN    Culture   Final    NO ANAEROBES ISOLATED; CULTURE IN PROGRESS FOR 5 DAYS Performed at [facility]    Report Status PENDING  Incomplete     ASSESSMENT: He has had a persistent retroperitoneal abscess. Cultures on 11/10/2014 grew VRE. He underwent repeat surgery yesterday. Operative Gram stain is negative for organisms and cultures are pending.  PLAN: 1. Continue current antibiotics 2. Await final culture and pathology results  Michel Bickers, MD [facility] for [place] [phone] pager   [phone] cell 11/30/2014, 4:30 PM

## 2014-11-30 NOTE — Progress Notes (Signed)
PROGRESS NOTE  Darryl Griffith WVP:[id number] DOB: 03/11/1946 DOA: 11/24/2014 PCP: Gennette Pac, MD  HPI: Darryl Griffith is a 69 y.o. male with a history of Left Flank Area Liposarcoma S/P Resection and Left Nephrectomy 06/2014 with Radiation Rx completed on [id number]/6270 complicated by abscess formation (+VRE), s/p drain placement for ~ 3 weeks (removed 8/2), was admitted on 8/26 with nausea/vomiting found to have SBO. General surgery was consulted on admission. Patient did not improve with conservative measures and is scheduled to undergo surgery today 8/31. He underwent incision and drainage of left back abscess, exploratory laparotomy, lysis of adhesions, small bowel resection and placement of drain into left retroperitoneal abscess.   Subjective / 24 H Interval events  - reports some abdominal pain. No nausea since yesterday.    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 Gram stain negative.  - appreciate ID recommendations.   HTN - prn hydralazine - BP  stable  Hypokalemia - stable  Hypomagnesemia - improved with repletion  Anemia - monitor CBC, likely of chronic illness, no bleeding apparent - appears to have stabilized.   Hyponatremia: Post op , will need hydration and repeat in am.    Diet: Diet NPO time specified Except for: Sips with Meds .TPN (CLINIMIX-E) Adult Fluids: NS DVT Prophylaxis: SCD  Code Status: Full Code Family Communication: no family bedside  Disposition Plan: remain inpatient  Consultants:  General surgery   ID  Procedures:  None    Antibiotics  Anti-infectives    Start     Dose/Rate Route Frequency Ordered Stop   11/29/14 0830  [MAR Hold]  ceFAZolin (ANCEF) IVPB 2 g/50 mL premix     (MAR Hold since 11/29/14 0825)   2 g 100 mL/hr over 30 Minutes Intravenous On call to O.R. 11/28/14 0850 11/29/14 0908   11/25/14 0000  metroNIDAZOLE (FLAGYL) IVPB 500 mg     500 mg 100 mL/hr over 60 Minutes Intravenous Every 8 hours 11/24/14 2355     11/25/14 0000  linezolid (ZYVOX) IVPB 600 mg     600 mg 300 mL/hr over 60 Minutes Intravenous Every 12 hours 11/24/14 2355        Studies No results found. Objective  Filed Vitals:   11/30/14 0457 11/30/14 0748 11/30/14 1339 11/30/14 1818  BP: 99/62  97/60 110/59  Pulse: 125  113 98  Temp: 98.8 F (37.1 C)  98.2 F (36.8 C) 98 F (36.7 C)  TempSrc: Oral  Oral Oral  Resp: 33 41 28 20  Height:      Weight:      SpO2: 100% 100% 100% 100%    Intake/Output Summary (  Last 24 hours) at 11/30/14 1847 Last data filed at 11/30/14 1746  Gross per 24 hour  Intake   2442 ml  Output    720 ml  Net   1722 ml   Filed Weights   11/24/14 2020  Weight: 73.936 kg (163 lb)   Exam:  GENERAL: NAD, NG tube in place  LUNGS: Clear to auscultation. No wheezing or crackles  HEART: Regular rate and rhythm without murmur. 2+ pulses, no JVD, no peripheral edema  ABDOMEN: Soft, nontender, and nondistended. No bowel sounds.   EXTREMITIES: Without any cyanosis,  clubbing, rash, lesions or edema.  Data Reviewed: Basic Metabolic Panel:  Recent Labs Lab 11/25/14 0530 11/26/14 0418 11/27/14 0408 11/29/14 0440 11/30/14 0530  NA 132* 135 134* 135 130*  K 3.4* 3.3* 3.5 4.1 4.4  CL 102 104 104 100* 99*  CO2 23 25 25 29 26   GLUCOSE 90 88 125* 127* 172*  BUN 12 12 13 18  31*  CREATININE 1.14 1.14 1.03 0.91 1.13  CALCIUM 8.4* 8.0* 8.0* 8.2* 7.6*  MG 1.5* 1.9 1.7  --  1.8  PHOS  --   --  2.1*  --  3.4   Liver Function Tests:  Recent Labs Lab 11/24/14 2130 11/27/14 0408 11/30/14 0530  AST 32 25 14*  ALT 21 17 10*  ALKPHOS 63 51 40  BILITOT 0.3 0.4 0.3  PROT 5.7* 5.0* 4.4*  ALBUMIN 2.5* 2.3* 1.8*    Recent Labs Lab 11/24/14 2130  LIPASE 47   CBC:  Recent Labs Lab 11/24/14 2130 11/25/14 0530 11/27/14 0408 11/29/14 0440 11/30/14 1030  WBC 5.8 5.3 4.0 3.5* 3.5*  NEUTROABS 4.7  --  3.1  --   --   HGB 8.6* 9.0* 7.8* 8.5* 8.4*  HCT 26.7* 26.5* 24.7* 25.8* 25.4*  MCV 80.7 80.1 81.0 80.6 80.6  PLT 447* 453* 366 352 303   Scheduled Meds: . sodium chloride   Intravenous Once  . acetaminophen  1,000 mg Intravenous 4 times per day  . albumin human  25 g Intravenous Q6H  . bisacodyl  10 mg Rectal Daily  . heparin subcutaneous  5,000 Units Subcutaneous 3 times per day  . HYDROmorphone PCA 0.3 mg/mL   Intravenous 6 times per day  . insulin aspart  0-9 Units Subcutaneous 4 times per day  . linezolid  600 mg Intravenous Q12H  . lip balm  1 application Topical BID  . methocarbamol (ROBAXIN)  IV  500 mg Intravenous 3 times per day  . metronidazole  500 mg Intravenous Q8H   Continuous Infusions: . Marland KitchenTPN (CLINIMIX-E) Adult 100 mL/hr at 11/30/14 1726   And  . fat emulsion 240 mL (11/30/14 1726)  . sodium chloride 10 mL/hr at 11/30/14 0426  . bupivacaine 0.25 % ON-Q pump DUAL CATH 300 mL      Hosie Poisson, MD [facility] Pager [phone]. If 7 PM - 7 AM, please contact night-coverage at [email or web address], password [facility] 11/30/2014,  6:47 PM  LOS: 6 days

## 2014-11-30 NOTE — Care Management Important Message (Signed)
Important Message  Patient Details  Name: Darryl Griffith MRN: [id number] Date of Birth: Aug 05, 1945   Medicare Important Message Given:  Yes-third notification given    Camillo Flaming 11/30/2014, 2:40 [place] Message  Patient Details  Name: Darryl Griffith MRN: [id number] Date of Birth: 1945-10-14   Medicare Important Message Given:  Yes-third notification given    Camillo Flaming 11/30/2014, 2:40 PM

## 2014-11-30 NOTE — Progress Notes (Signed)
PARENTERAL NUTRITION CONSULT NOTE - INITIAL  Pharmacy Consult for TPN Indication: SBO  Allergies  Allergen Reactions  . Compazine [Prochlorperazine] Other (See Comments)    Made pt really lethargic, and sleepy   Patient Measurements: Height: 5\' 8"  (172.7 cm) Weight: 163 lb (73.936 kg) IBW/kg (Calculated) : 68.4  Vital Signs: Temp: 98.8 F (37.1 C) (09/01 0457) Temp Source: Oral (09/01 0457) BP: 99/62 mmHg (09/01 0457) Pulse Rate: 125 (09/01 0457) Intake/Output from previous day: 08/31 0701 - 09/01 0700 In: 9485 [I.V.:3000; NG/GT:360; IV Piggyback:400; TPN:1262] Out: 1185 [Urine:520; Emesis/NG output:400; Drains:165; Blood:100] Intake/Output from this shift: Total I/O In: 60 [Other:60] Out: -   Labs:  Recent Labs  11/29/14 0440  WBC 3.5*  HGB 8.5*  HCT 25.8*  PLT 352    Recent Labs  11/29/14 0440 11/30/14 0530  NA 135 130*  K 4.1 4.4  CL 100* 99*  CO2 29 26  GLUCOSE 127* 172*  BUN 18 31*  CREATININE 0.91 1.13  CALCIUM 8.2* 7.6*  MG  --  1.8  PHOS  --  3.4  PROT  --  4.4*  ALBUMIN  --  1.8*  AST  --  14*  ALT  --  10*  ALKPHOS  --  40  BILITOT  --  0.3   Estimated Creatinine Clearance: 58.8 mL/min (by C-G formula based on Cr of 1.13).    Recent Labs  11/29/14 1806 11/29/14 2332 11/30/14 0501  GLUCAP 230* 205* 159*    Medical History: Past Medical History  Diagnosis Date  . Hypertension   . Anginal pain     hx of CP saw Dr Wynonia Lawman 09/20/2013 H&P per chart   . Left Retroperitoneal mass s/p resection/L nephrectomy 07/13/2014   . Postoperative anemia due to acute blood loss 07/15/2014  . SBO (small bowel obstruction) 08/01/2014, 10/11/14  . Retroperitoneal fluid collection   . 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 Impaired fasting glucose 11/25/2014  . Benign neoplasm of colon 11/25/2014  . Actinic keratosis 11/25/2014  . Mixed hyperlipidemia 11/25/2014    Medications:  Scheduled:  . sodium chloride   Intravenous Once  . bisacodyl  10 mg Rectal Daily  . heparin subcutaneous  5,000 Units Subcutaneous 3 times per day  . HYDROmorphone PCA 0.3 mg/mL   Intravenous 6 times per day  . insulin aspart  0-9 Units Subcutaneous 4 times per day  . linezolid  600 mg Intravenous Q12H  . lip balm  1 application Topical BID  . metronidazole  500 mg Intravenous Q8H   Infusions:  . Marland KitchenTPN (CLINIMIX-E) Adult 100 mL/hr at 11/29/14 1725   And  . fat emulsion 240 mL (11/29/14 1725)  . sodium chloride 10 mL/hr at 11/30/14 0426  . bupivacaine 0.25 % ON-Q pump DUAL CATH 300 mL     Insulin Requirements: 8 units over the past 24 hours (on sensitive SSI q6h)  Current Nutrition: NPO - NG tube   IVF: NS at [facility] access: PICC placed 10/12/14 TPN start date: 8/28  ASSESSMENT  HPI:  38 yoM s/p resection L retroperitoneal sarcoma, L nephrectomy 07/13/14 developed VRE abscess treated with po Linezolid/Flagyl. Hx abd bloating, N/V 2 days PTA, SBO seen on CT for Ca restaging.  Hx recurrent SBO. Begin TPN per Pharmacy  Significant events:   8/29: plan for exp laparotomy on 8/31; s/p aspiration of L retroperitoneal fluid collection on 8/29 8/31:  s/p exp lap -->  I&D L back abscess, lysis of adhesions, small bowel resection, placement og drain into L retroperitoneal abscess   Today:   Glucose (goal <150): 159-230 (two readings over 200)  Electrolytes - Na 130, Mag 1.8;  K 4.4, CoCa, and phos wnl  Renal - wnl  LFTs - wnl  TGs - pending  Prealbumin - pending  NUTRITIONAL GOALS                                                                                             RD recs:   Kcal: 2300-2500  Protein: 110-120 grams  Fluid: 2.2-2.5 L/day  Clinimix E 5/15 at a goal rate of 175ml/hr + 20% fat emulsion at 10 ml/hr to provide: 120 g/day  protein, 2184 Kcal/day +++ if patient tolerates clinimix E 5/15 at goal rate of 100 ml/hr and CBGs are within goal range, then will change to clinimix E 5/20 at goal rate of 90 ml/hr to obtain goal protein 108 g/day and total Kcal 2380 +++  PLAN                                                                                                                         1) magnesium sulfate 1gm IV x1 2) At 1800 today:  continue Clinimix E 5/15 rate to 100 ml/hr (goal rate).  Once CBGs improve, will transition over to clinimix E 5/20  20% fat emulsion at 10 ml/hr.  TPN to contain standard multivitamins and trace elements.  Reduce IVF to  [facility].  Continue sensitive SSI q6h  TPN lab panels on Mondays & Thursdays.  F/u daily.  Dia Sitter, PharmD, BCPS 11/30/2014 7:51 AM

## 2014-11-30 NOTE — Progress Notes (Signed)
Patient ID: Darryl Griffith, male   DOB: 11-19-1945, 69 y.o.   MRN: [id number]     [name]      [address] [place], [place] [address]    Phone: [phone] FAX: [phone]     Subjective: Having pain issues post op.  When he presses PCA button, he states pain actually seems to increase a bit first, then eases off.    Objective:  Vital signs:  Filed Vitals:   11/29/14 2336 11/30/14 0400 11/30/14 0457 11/30/14 0748  BP: 97/64  99/62   Pulse: 129  125   Temp: 98.6 F (37 C)  98.8 F (37.1 C)   TempSrc: Oral  Oral   Resp: 33 26 33 41  Height:      Weight:      SpO2: 100% 100% 100% 100%    Last BM Date: 11/28/14  Intake/Output   Yesterday:  08/31 0701 - 09/01 0700 In: 6195 [I.V.:3000; NG/GT:360; IV Piggyback:400; TPN:1262] Out: 0932 [Urine:520; Emesis/NG output:400; Drains:165; Blood:100] This shift:  Total I/O In: 60 [Other:60] Out: -    Physical Exam: General: Pt sleeping, but easily awakens and then is alert/oriented x4  Abdomen: Soft. Sl distended.  Incision c/d/i.  Drain serosang.   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    Hypertension   Anemia of chronic disease   Abscess, retroperitoneal   Malnutrition of moderate degree   Hypokalemia    Results:   Labs: Results for orders placed or performed during the hospital encounter of 11/24/14 (from the past 48 hour(s))  Type and screen     Status: None (Preliminary result)   Collection Time: 11/28/14 10:05 AM  Result Value Ref Range   ABO/RH(D) O NEG    Antibody Screen NEG    Sample Expiration 12/01/2014    Unit Number [id number]    Blood Component Type RED CELLS,LR    Unit division 00    Status of Unit ALLOCATED    Transfusion Status OK TO TRANSFUSE    Crossmatch Result Compatible    Unit  Number [id number]    Blood Component Type RBC LR PHER2    Unit division 00    Status of Unit ALLOCATED    Transfusion Status OK TO TRANSFUSE    Crossmatch Result Compatible   Prepare RBC     Status: None   Collection Time: 11/28/14 10:05 AM  Result Value Ref Range   Order Confirmation ORDER PROCESSED BY BLOOD BANK   Glucose, capillary     Status: Abnormal   Collection Time: 11/28/14 11:35 AM  Result Value Ref Range   Glucose-Capillary 147 (H) 65 - 99 mg/dL  Glucose, capillary     Status: Abnormal   Collection Time: 11/28/14  6:19 PM  Result Value Ref Range   Glucose-Capillary 145 (H) 65 - 99 mg/dL  Glucose, capillary     Status: Abnormal   Collection Time: 11/28/14 11:24 PM  Result Value Ref Range   Glucose-Capillary 117 (H) 65 - 99 mg/dL  CBC     Status: Abnormal   Collection Time: 11/29/14  4:40 AM  Result Value Ref Range   WBC 3.5 (L) 4.0 - 10.5 K/uL   RBC 3.20 (L) 4.22 - 5.81 MIL/uL   Hemoglobin 8.5 (L) 13.0 - 17.0 g/dL   HCT 25.8 (L) 39.0 -  52.0 %   MCV 80.6 78.0 - 100.0 fL   MCH 26.6 26.0 - 34.0 pg   MCHC 32.9 30.0 - 36.0 g/dL   RDW 17.4 (H) 11.5 - 15.5 %   Platelets 352 150 - 400 K/uL  Basic metabolic panel     Status: Abnormal   Collection Time: 11/29/14  4:40 AM  Result Value Ref Range   Sodium 135 135 - 145 mmol/L   Potassium 4.1 3.5 - 5.1 mmol/L   Chloride 100 (L) 101 - 111 mmol/L   CO2 29 22 - 32 mmol/L   Glucose, Bld 127 (H) 65 - 99 mg/dL   BUN 18 6 - 20 mg/dL   Creatinine, Ser 0.91 0.61 - 1.24 mg/dL   Calcium 8.2 (L) 8.9 - 10.3 mg/dL   GFR calc non Af Amer >60 >60 mL/min   GFR calc Af Amer >60 >60 mL/min    Comment: (NOTE) The eGFR has been calculated using the CKD EPI equation. This calculation has not been validated in all clinical situations. eGFR's persistently <60 mL/min signify possible Chronic Kidney Disease.    Anion gap 6 5 - 15  Glucose, capillary     Status: Abnormal   Collection Time: 11/29/14  5:17 AM  Result Value Ref Range    Glucose-Capillary 133 (H) 65 - 99 mg/dL  Surgical pcr screen     Status: None   Collection Time: 11/29/14  6:02 AM  Result Value Ref Range   MRSA, PCR NEGATIVE NEGATIVE   Staphylococcus aureus NEGATIVE NEGATIVE    Comment:        The Xpert SA Assay (FDA approved for NASAL specimens in patients over 58 years of age), is one component of a comprehensive surveillance program.  Test performance has been validated by [facility] for patients greater than or equal to 7 year old. It is not intended to diagnose infection nor to guide or monitor treatment.   Glucose, capillary     Status: Abnormal   Collection Time: 11/29/14  8:39 AM  Result Value Ref Range   Glucose-Capillary 124 (H) 65 - 99 mg/dL   Comment 1 Call MD NNP PA CNM   Body fluid culture     Status: None (Preliminary result)   Collection Time: 11/29/14 11:42 AM  Result Value Ref Range   Specimen Description FLUID PERITONEAL LEFT PERITONEAL FLUID    Special Requests NONE    Gram Stain      FEW WBC PRESENT, PREDOMINANTLY PMN NO ORGANISMS SEEN Performed at [facility]    Culture PENDING    Report Status PENDING   Glucose, capillary     Status: Abnormal   Collection Time: 11/29/14  6:06 PM  Result Value Ref Range   Glucose-Capillary 230 (H) 65 - 99 mg/dL   Comment 1 Notify RN    Comment 2 Document in Chart   Glucose, capillary     Status: Abnormal   Collection Time: 11/29/14 11:32 PM  Result Value Ref Range   Glucose-Capillary 205 (H) 65 - 99 mg/dL  Glucose, capillary     Status: Abnormal   Collection Time: 11/30/14  5:01 AM  Result Value Ref Range   Glucose-Capillary 159 (H) 65 - 99 mg/dL  Comprehensive metabolic panel     Status: Abnormal   Collection Time: 11/30/14  5:30 AM  Result Value Ref Range   Sodium 130 (L) 135 - 145 mmol/L   Potassium 4.4 3.5 - 5.1 mmol/L   Chloride 99 (L) 101 -  111 mmol/L   CO2 26 22 - 32 mmol/L   Glucose, Bld 172 (H) 65 - 99 mg/dL   BUN 31 (H) 6 - 20 mg/dL   Creatinine,  Ser 1.13 0.61 - 1.24 mg/dL   Calcium 7.6 (L) 8.9 - 10.3 mg/dL   Total Protein 4.4 (L) 6.5 - 8.1 g/dL   Albumin 1.8 (L) 3.5 - 5.0 g/dL   AST 14 (L) 15 - 41 U/L   ALT 10 (L) 17 - 63 U/L   Alkaline Phosphatase 40 38 - 126 U/L   Total Bilirubin 0.3 0.3 - 1.2 mg/dL   GFR calc non Af Amer >60 >60 mL/min   GFR calc Af Amer >60 >60 mL/min    Comment: (NOTE) The eGFR has been calculated using the CKD EPI equation. This calculation has not been validated in all clinical situations. eGFR's persistently <60 mL/min signify possible Chronic Kidney Disease.    Anion gap 5 5 - 15  Magnesium     Status: None   Collection Time: 11/30/14  5:30 AM  Result Value Ref Range   Magnesium 1.8 1.7 - 2.4 mg/dL  Phosphorus     Status: None   Collection Time: 11/30/14  5:30 AM  Result Value Ref Range   Phosphorus 3.4 2.5 - 4.6 mg/dL    Imaging / Studies: No results found.  Medications / Allergies:  Scheduled Meds: . sodium chloride   Intravenous Once  . acetaminophen  1,000 mg Intravenous 4 times per day  . albumin human  25 g Intravenous Q6H  . bisacodyl  10 mg Rectal Daily  . heparin subcutaneous  5,000 Units Subcutaneous 3 times per day  . HYDROmorphone PCA 0.3 mg/mL   Intravenous 6 times per day  . insulin aspart  0-9 Units Subcutaneous 4 times per day  . linezolid  600 mg Intravenous Q12H  . lip balm  1 application Topical BID  . magnesium sulfate 1 - 4 g bolus IVPB  1 g Intravenous Once  . methocarbamol (ROBAXIN)  IV  500 mg Intravenous 3 times per day  . metronidazole  500 mg Intravenous Q8H  . sodium chloride  1,000 mL Intravenous Once   Continuous Infusions: . Marland KitchenTPN (CLINIMIX-E) Adult 100 mL/hr at 11/29/14 1725   And  . fat emulsion 240 mL (11/29/14 1725)  . sodium chloride 10 mL/hr at 11/30/14 0426  . bupivacaine 0.25 % ON-Q pump DUAL CATH 300 mL     PRN Meds:.acetaminophen **OR** acetaminophen, alum & mag hydroxide-simeth, diphenhydrAMINE **OR** diphenhydrAMINE, HYDROmorphone  (DILAUDID) injection, LORazepam, magic mouthwash, menthol-cetylpyridinium, metoprolol, naloxone **AND** sodium chloride, ondansetron **OR** ondansetron (ZOFRAN) IV, phenol, promethazine, sodium chloride  Antibiotics: Anti-infectives    Start     Dose/Rate Route Frequency Ordered Stop   11/29/14 0830  [MAR Hold]  ceFAZolin (ANCEF) IVPB 2 g/50 mL premix     (MAR Hold since 11/29/14 0825)   2 g 100 mL/hr over 30 Minutes Intravenous On call to O.R. 11/28/14 0850 11/29/14 0908   11/25/14 0000  metroNIDAZOLE (FLAGYL) IVPB 500 mg     500 mg 100 mL/hr over 60 Minutes Intravenous Every 8 hours 11/24/14 2355     11/25/14 0000  linezolid (ZYVOX) IVPB 600 mg     600 mg 300 mL/hr over 60 Minutes Intravenous Every 12 hours 11/24/14 2355          Assessment/Plan left retroperitoneal liposarcoma s/p resection and left nephrectomy 07/13/14 s/p XRT---Dr. Barry Dienes  Post op intra-abdominal abscesses(VRE) Recurrent SBO-NGT, NPO, TPN.  POD 1 ex lap/lysis of adhesions, small bowel resection. 11/29/2014  Oliguria - Bolus with NS.  Add 2 days of 25% albumin q6 h to finish AM 9/3.  Pt with single kidney, so would be generous with IVF.  Neuro - add IV Tylenol and robaxin for pain control.  Would avoid toradol given single kidney.  Increase availability of rescue doses.    ID-linezolid/flagyl for VRE.  IR perc aspiration cx pending.  Cx pending from OR.    PCM-prealbumin 12, TPN. Alb 1.8  Acute on chronic anemia-reasonably stable.  Recheck today.  Would give blood if HCT down much at all, given tachycardia and oliguria.    Dispo-await return of bowel function.     11/30/2014  10:00 AM

## 2014-11-30 NOTE — Progress Notes (Signed)
Nutrition Follow-up  DOCUMENTATION CODES:   Non-severe (moderate) malnutrition in context of chronic illness  INTERVENTION:  - Continue TPN per pharmacy - RD will continue to monitor for needs  NUTRITION DIAGNOSIS:   Inadequate oral intake related to inability to eat as evidenced by NPO status. -ongoing, needs met with TPN  GOAL:   Patient will meet greater than or equal to 90% of their needs -met with TPN  MONITOR:   Diet advancement, Weight trends, Labs, I & O's  ASSESSMENT:   69 y.o. male with a history of Left Flank Area Liposarcoma S/P Resection and Left Nephrectomy 06/2014 with Radiation Rx completed on 11/7351 complicated by abscess formation (+VRE) who presents to the ED with complaints of ABD bloating x 2 days and nausea and vomiting x 4 today. He denies any hematemesis, fevers or chills. A Ct scan was performed 8 da58 y.o. male with a history of Left Flank Area Liposarcoma S/P Resection and Left Nephrectomy 06/2014 with Radiation Rx completed on 29/9242 complicated by abscess formation (+VRE) who presents to the ED with complaints of ABD bloating x 2 days and nausea and vomiting x 4 today. He denies any hematemesis, fevers or chills. A Ct scan was performed 1 day ago for cancer staging and revealed a high grade partial small bowl obstruction and General surgery was consulted and an NGT was placed. He was referred for medical admission. He is on oral Linezolid and Metronidazole for +VRE For a chronic intra-abdominal abscess. y ago for cancer staging and revealed a high grade partial small bowl obstruction and General surgery was consulted and an NGT was placed. He was referred for medical admission. He is on oral Linezolid and Metronidazole for +VRE For a chronic intra-abdominal abscess.    9/1 POD #1 I&D L back abscess, ex lap, LOA, small bowel resection, and placement of drain to L retroperitoneal abscess.   NGT remains in place and ~375cc light  brown-colored drainage present in canister at time of RD visit. He is currently receiving Clinimix E 5/15 @ 100 mL/hr with 20% lipids @ 10 mL/hr which is providing 120 grams protein, 2184 kcal (100% protein needs and 95% minimum estimated kcal needs).  No pharmacy note yet today. Per pharmacy note yesterday AM: At 1800 today:  continue Clinimix E 5/15 rate to 100 ml/hr (goal rate)  20% fat emulsion at 10 ml/hr.  TPN to contain standard multivitamins and trace elements.  Reduce IVF to [facility].  continue sensitive SSI q6h  TPN lab panels on Mondays & Thursdays.  Also per pharmacy note yesterday: If patient tolerates clinimix E 5/15 at goal rate of 100 ml/hr and CBGs are within goal range, then will change to clinimix E 5/20 at goal rate of 90 ml/hr to obtain goal protein 108 g/day and total Kcal 2380. This would provide 98% minimum estimated protein needs and 100% kcal needs.  It is preferential to meet 100% protein needs before meeting 100% kcal needs due to extensive surgery yesterday (8/31).   Medications reviewed. Labs reviewed; CBGs: 117-230 mg/dL, Na: 130 mmol/L, Cl: 99 mmol/L, BUN elevated, Ca: 7.6 mg/dL.  8/29 - Pt with NGT placed 8/26 which was clamped at time of RD visit with ~500cc drainage present in canister.  - Pt denies nausea, vomiting, or abdominal pain this AM.  - He states that since d/c earlier this month he has been eating and drinking without difficulty until about 3-4 days PTA. He states during this time he began having nausea and  vomiting with all PO intakes and that last episode of emesis was the day of or day after admission. - Per chart review, pt has lost 11 lbs (6% body weight) in the past 1 month which is significant for time frame. Mild muscle and fat wasting also noted. - Order currently in place for Clinimix E 5/15 @ 40 mL/hr with 20% lipids @ 10 mL/hr which provides 48 grams protein, 1162 kcal and was started yesterday at 1800. - Per pharmacy note yesterday,  plan for goal of Clinimix E 5/15 with 20% lipids @ 10 mL/hr which will provide 120 grams protein, 2184 kcal which will meet protein needs and 95% kcal needs. - Clinimix E 5/20 @ 93 mL/hr with 20% lipids @ 10 mL/hr would better meet needs as it would provide 112 grams protein, 2444 kcal.  Diet Order:  Diet NPO time specified Except for: Sips with Meds .TPN (CLINIMIX-E) Adult  Skin:  Reviewed, no issues  Last BM:  8/30  Height:   Ht Readings from Last 1 Encounters:  11/24/14 5' 8"  (1.727 m)    Weight:   Wt Readings from Last 1 Encounters:  11/24/14 163 lb (73.936 kg)    Ideal Body Weight:  70 kg (kg)  BMI:  Body mass index is 24.79 kg/(m^2).  Estimated Nutritional Needs:   Kcal:  7023-0172  Protein:  110-120 grams  Fluid:  2.2-2.5 L/day  EDUCATION NEEDS:   No education needs identified at this time     Jarome Matin, RD, LDN Inpatient Clinical Dietitian Pager # [phone] After hours/weekend pager # [phone]

## 2014-12-01 ENCOUNTER — Inpatient Hospital Stay (HOSPITAL_COMMUNITY): Payer: Medicare Other

## 2014-12-01 DIAGNOSIS — K6819 Other retroperitoneal abscess: Secondary | ICD-10-CM

## 2014-12-01 LAB — PREPARE RBC (CROSSMATCH)

## 2014-12-01 LAB — CBC
HEMATOCRIT: 19.1 % — AB (ref 39.0–52.0)
HEMOGLOBIN: 6.4 g/dL — AB (ref 13.0–17.0)
MCH: 26.8 pg (ref 26.0–34.0)
MCHC: 33.5 g/dL (ref 30.0–36.0)
MCV: 79.9 fL (ref 78.0–100.0)
Platelets: 298 10*3/uL (ref 150–400)
RBC: 2.39 MIL/uL — AB (ref 4.22–5.81)
RDW: 17.5 % — ABNORMAL HIGH (ref 11.5–15.5)
WBC: 2.9 10*3/uL — ABNORMAL LOW (ref 4.0–10.5)

## 2014-12-01 LAB — BASIC METABOLIC PANEL
ANION GAP: 6 (ref 5–15)
BUN: 29 mg/dL — AB (ref 6–20)
CHLORIDE: 97 mmol/L — AB (ref 101–111)
CO2: 28 mmol/L (ref 22–32)
Calcium: 8.1 mg/dL — ABNORMAL LOW (ref 8.9–10.3)
Creatinine, Ser: 1 mg/dL (ref 0.61–1.24)
GFR calc Af Amer: >60 mL/min (ref >60)
Glucose, Bld: 137 mg/dL — ABNORMAL HIGH (ref 65–99)
POTASSIUM: 4.2 mmol/L (ref 3.5–5.1)
SODIUM: 131 mmol/L — AB (ref 135–145)

## 2014-12-01 LAB — HEMOGLOBIN AND HEMATOCRIT, BLOOD
HEMATOCRIT: 21.9 % — AB (ref 39.0–52.0)
Hemoglobin: 7.4 g/dL — ABNORMAL LOW (ref 13.0–17.0)

## 2014-12-01 LAB — GLUCOSE, CAPILLARY
GLUCOSE-CAPILLARY: 139 mg/dL — AB (ref 65–99)
GLUCOSE-CAPILLARY: 106 mg/dL — AB (ref 65–99)
Glucose-Capillary: 129 mg/dL — ABNORMAL HIGH (ref 65–99)

## 2014-12-01 MED ORDER — DIPHENHYDRAMINE HCL 50 MG/ML IJ SOLN: 12.5000 mg | Freq: Four times a day (QID) | INTRAMUSCULAR | Status: DC | PRN

## 2014-12-01 MED ORDER — ONDANSETRON HCL 4 MG/2ML IJ SOLN
4.0000 mg | Freq: Four times a day (QID) | INTRAMUSCULAR | Status: DC | PRN
  Administered 2014-12-08: 4 mg via INTRAVENOUS
  Filled 2014-12-01: qty 2

## 2014-12-01 MED ORDER — DIPHENHYDRAMINE HCL 12.5 MG/5ML PO ELIX: 12.5000 mg | ORAL_SOLUTION | Freq: Four times a day (QID) | ORAL | Status: DC | PRN

## 2014-12-01 MED ORDER — SODIUM CHLORIDE 0.9 % IV SOLN: Freq: Once | INTRAVENOUS | Status: DC

## 2014-12-01 MED ORDER — LIDOCAINE HCL 1 % IJ SOLN
INTRAMUSCULAR | Status: AC
  Filled 2014-12-01: qty 20

## 2014-12-01 MED ORDER — NALOXONE HCL 0.4 MG/ML IJ SOLN: 0.4000 mg | INTRAMUSCULAR | Status: DC | PRN

## 2014-12-01 MED ORDER — HYDROMORPHONE 0.3 MG/ML IV SOLN
INTRAVENOUS | Status: DC
  Administered 2014-12-01: 1.8 mg via INTRAVENOUS
  Administered 2014-12-01: 1.5 mg via INTRAVENOUS
  Administered 2014-12-01: 2.6 mg via INTRAVENOUS
  Administered 2014-12-01: 0.2 mg via INTRAVENOUS
  Administered 2014-12-02: 0.9 mg via INTRAVENOUS
  Administered 2014-12-02: 2.7 mg via INTRAVENOUS
  Administered 2014-12-02: 16:00:00 via INTRAVENOUS
  Administered 2014-12-02: 3.3 mg via INTRAVENOUS
  Administered 2014-12-02: 2.7 mg via INTRAVENOUS
  Administered 2014-12-02: 0.6 mg via INTRAVENOUS
  Administered 2014-12-03: 0.9 mg via INTRAVENOUS
  Administered 2014-12-03: 2.1 mg via INTRAVENOUS
  Administered 2014-12-03: 0.9 mg via INTRAVENOUS
  Administered 2014-12-03: 1.8 mg via INTRAVENOUS
  Administered 2014-12-03: 2.4 mg via INTRAVENOUS
  Administered 2014-12-03: 1.8 mg via INTRAVENOUS
  Administered 2014-12-04: 0.3 mg via INTRAVENOUS
  Administered 2014-12-04: 2.1 mg via INTRAVENOUS
  Administered 2014-12-04: 0.9 mg via INTRAVENOUS
  Administered 2014-12-04: 0.3 mg via INTRAVENOUS
  Administered 2014-12-04: 1.8 mg via INTRAVENOUS
  Administered 2014-12-04: 4.9 mg via INTRAVENOUS
  Administered 2014-12-04: 0.9 mg via INTRAVENOUS
  Administered 2014-12-05: 1.8 mg via INTRAVENOUS
  Administered 2014-12-05: 1.5 mg via INTRAVENOUS
  Administered 2014-12-05: 3.6 mg via INTRAVENOUS
  Administered 2014-12-05: 13:00:00 via INTRAVENOUS
  Administered 2014-12-05: 1.8 mg via INTRAVENOUS
  Administered 2014-12-06: 1.5 mg via INTRAVENOUS
  Administered 2014-12-06: 2.5 mg via INTRAVENOUS
  Administered 2014-12-06: 0.9 mg via INTRAVENOUS
  Administered 2014-12-06: 0.3 mg via INTRAVENOUS
  Administered 2014-12-06: 11:00:00 via INTRAVENOUS
  Administered 2014-12-06: 0.3 mg via INTRAVENOUS
  Administered 2014-12-07: 2.75 mg via INTRAVENOUS
  Administered 2014-12-07: 2.29 mg via INTRAVENOUS
  Administered 2014-12-07: 1.5 mg via INTRAVENOUS
  Administered 2014-12-07: 0.771 mg via INTRAVENOUS
  Administered 2014-12-07: 13:00:00 via INTRAVENOUS
  Administered 2014-12-07: 1.2 mg via INTRAVENOUS
  Administered 2014-12-07: 0.6 mg via INTRAVENOUS
  Administered 2014-12-08 (×2): via INTRAVENOUS
  Administered 2014-12-08: 2.49 mg via INTRAVENOUS
  Administered 2014-12-08: 1.2 mg via INTRAVENOUS
  Administered 2014-12-09: 1.97 mg via INTRAVENOUS
  Administered 2014-12-09: 3.49 mg via INTRAVENOUS
  Administered 2014-12-09: 02:00:00 via INTRAVENOUS
  Administered 2014-12-09: 2.99 mg via INTRAVENOUS
  Administered 2014-12-09: 0.999 mg via INTRAVENOUS
  Administered 2014-12-09: 349 mg via INTRAVENOUS
  Administered 2014-12-09: 1.5 mg via INTRAVENOUS
  Administered 2014-12-09: 23:00:00 via INTRAVENOUS
  Administered 2014-12-10: 2 mg via INTRAVENOUS
  Administered 2014-12-10: 0.399 mg via INTRAVENOUS
  Administered 2014-12-10: 4.6 mg via INTRAVENOUS
  Administered 2014-12-11: 1.49 mg via INTRAVENOUS
  Filled 2014-12-01 (×15): qty 25

## 2014-12-01 MED ORDER — FAT EMULSION 20 % IV EMUL
240.0000 mL | INTRAVENOUS | Status: AC
  Administered 2014-12-01: 240 mL via INTRAVENOUS
  Filled 2014-12-01: qty 250

## 2014-12-01 MED ORDER — PANTOPRAZOLE SODIUM 40 MG IV SOLR
40.0000 mg | Freq: Two times a day (BID) | INTRAVENOUS | Status: DC
  Administered 2014-12-01 – 2014-12-16 (×32): 40 mg via INTRAVENOUS
  Filled 2014-12-01 (×34): qty 40

## 2014-12-01 MED ORDER — TRACE MINERALS CR-CU-MN-SE-ZN 10-1000-500-60 MCG/ML IV SOLN
INTRAVENOUS | Status: AC
  Administered 2014-12-01: 18:00:00 via INTRAVENOUS
  Filled 2014-12-01: qty 2232

## 2014-12-01 MED ORDER — SODIUM CHLORIDE 0.9 % IJ SOLN: 9.0000 mL | INTRAMUSCULAR | Status: DC | PRN

## 2014-12-01 NOTE — Progress Notes (Signed)
PARENTERAL NUTRITION CONSULT NOTE - INITIAL  Pharmacy Consult for TPN Indication: SBO  Allergies  Allergen Reactions  . Compazine [Prochlorperazine] Other (See Comments)    Made pt really lethargic, and sleepy   Patient Measurements: Height: 5\' 8"  (172.7 cm) Weight: 163 lb (73.936 kg) IBW/kg (Calculated) : 68.4  Vital Signs: Temp: 98.7 F (37.1 C) (09/02 0458) Temp Source: Oral (09/02 0458) BP: 132/71 mmHg (09/02 0458) Pulse Rate: 104 (09/02 0458) Intake/Output from previous day: 09/01 0701 - 09/02 0700 In: 790 [I.V.:10; NG/GT:360] Out: 2825 [Urine:1450; Emesis/NG output:1300; Drains:75] Intake/Output from this shift:    Labs:  Recent Labs  11/29/14 0440 11/30/14 1030 12/01/14 0530  WBC 3.5* 3.5* 2.9*  HGB 8.5* 8.4* 6.4*  HCT 25.8* 25.4* 19.1*  PLT 352 303 298    Recent Labs  11/29/14 0440 11/30/14 0530 12/01/14 0453  NA 135 130* 131*  K 4.1 4.4 4.2  CL 100* 99* 97*  CO2 29 26 28   GLUCOSE 127* 172* 137*  BUN 18 31* 29*  CREATININE 0.91 1.13 1.00  CALCIUM 8.2* 7.6* 8.1*  MG  --  1.8  --   PHOS  --  3.4  --   PROT  --  4.4*  --   ALBUMIN  --  1.8*  --   AST  --  14*  --   ALT  --  10*  --   ALKPHOS  --  40  --   BILITOT  --  0.3  --    Estimated Creatinine Clearance: 66.5 mL/min (by C-G formula based on Cr of 1).    Recent Labs  11/30/14 1731 11/30/14 2319 12/01/14 0501  GLUCAP 148* 146* 139*    Medical History: Past Medical History  Diagnosis Date  . Hypertension   . Anginal pain     hx of CP saw Dr Wynonia Lawman 09/20/2013 H&P per chart   . Left Retroperitoneal mass s/p resection/L nephrectomy 07/13/2014   . Postoperative anemia due to acute blood loss 07/15/2014  . SBO (small bowel obstruction) 08/01/2014, 10/11/14  . Retroperitoneal fluid collection   . 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 Impaired fasting glucose 11/25/2014  . Benign neoplasm of colon 11/25/2014   . Actinic keratosis 11/25/2014  . Mixed hyperlipidemia 11/25/2014   Medications:  Scheduled:  . sodium chloride   Intravenous Once  . sodium chloride   Intravenous Once  . albumin human  25 g Intravenous Q6H  . bisacodyl  10 mg Rectal Daily  . heparin subcutaneous  5,000 Units Subcutaneous 3 times per day  . HYDROmorphone PCA 0.3 mg/mL   Intravenous 6 times per day  . insulin aspart  0-9 Units Subcutaneous 4 times per day  . linezolid  600 mg Intravenous Q12H  . lip balm  1 application Topical BID  . methocarbamol (ROBAXIN)  IV  500 mg Intravenous 3 times per day  . metronidazole  500 mg Intravenous Q8H   Infusions:  . Marland KitchenTPN (CLINIMIX-E) Adult 100 mL/hr at 11/30/14 1726   And  . fat emulsion 240 mL (11/30/14 1726)  . sodium chloride 10 mL/hr at 11/30/14 0426  . bupivacaine 0.25 % ON-Q pump DUAL CATH 300 mL     Insulin Requirements: 5 units over the past 24 hours (on sensitive SSI q6h) - no hx DM  Current Nutrition: NPO - NG tube   IVF: NS at [facility] access: PICC placed 10/12/14 TPN  start date: 8/28  ASSESSMENT                                                                                                          HPI:  16 yoM s/p resection L retroperitoneal sarcoma, L nephrectomy 07/13/14 developed VRE abscess treated with po Linezolid/Flagyl. Hx abd bloating, N/V 2 days PTA, SBO seen on CT for Ca restaging.  Hx recurrent SBO. Begin TPN per Pharmacy  Significant events:   8/29: plan for exp laparotomy on 8/31; s/p aspiration of L retroperitoneal fluid collection on 8/29 8/31:  s/p exp lap -->  I&D L back abscess, lysis of adhesions, small bowel resection, placement og drain into L retroperitoneal abscess 9/2:bloody NGT output, PPI IV BID added;  change clinimix E 5/15 to E 5/20   Today:   Glucose (goal <150): all readings <150 except for one (151)  Electrolytes - Na 131;  K, CoCa wnl  Renal - wnl  LFTs - wnl  TGs - pending  Prealbumin -  pending  NUTRITIONAL GOALS                                                                                             RD recs:   Kcal: 2300-2500  Protein: 110-120 grams  Fluid: 2.2-2.5 L/day  Clinimix E 5/20 at a goal rate of 93 ml/hr + 20% fat emulsion at 10 ml/hr to provide: 112 g/day protein, 2444 Kcal/day (per dietician recom.)   PLAN                                                                                                                         At 1800 today:  Patient's tolerating Clinimix E 5/15 at 100 ml/hr (goal rate) and cbgs remain within desired range. Will transition over to clinimix E 5/20 at 93 ml/hr today  20% fat emulsion at 10 ml/hr.  TPN to contain standard multivitamins and trace elements.  Reduce IVF to  [facility].  Continue sensitive SSI q6h  TPN lab panels on Mondays & Thursdays.  F/u daily.  Dia Sitter, PharmD, BCPS 12/01/2014 7:19 AM

## 2014-12-01 NOTE — Progress Notes (Signed)
Nutrition Follow-up  DOCUMENTATION CODES:   Non-severe (moderate) malnutrition in context of chronic illness  INTERVENTION:  - Continue TPN per pharmacy - RD will continue to monitor for needs, specially ability for diet advancement  NUTRITION DIAGNOSIS:   Inadequate oral intake related to inability to eat as evidenced by NPO status. -ongoing  GOAL:   Patient will meet greater than or equal to 90% of their needs -met with TPN  MONITOR:   Diet advancement, Weight trends, Labs, I & O's  ASSESSMENT:   69 y.o. 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 He denies any hematemesis, fevers or chills. A Ct scan was performed [age] y.o. 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 He denies any hematemesis, fevers or chills. A Ct scan was performed 1 day ago for cancer staging and revealed a high grade partial small bowl obstruction and General surgery was consulted and an NGT was placed. He was referred for medical admission. He is on oral Linezolid and Metronidazole for +VRE For a chronic intra-abdominal abscess. y ago for cancer staging and revealed a high grade partial small bowl obstruction and General surgery was consulted and an NGT was placed. He was referred for medical admission. He is on oral Linezolid and Metronidazole for +VRE For a chronic intra-abdominal abscess.    9/2 POD #2. Pt continues to be NPO. NGT remains in place and surgery notes plan to add Protonix BID due to bloody drainage.   Pt continues with  Clinimix E 5/15 @ 100 mL/hr with 20% lipids @ 10 mL/hr which is providing 120 grams protein, 2184 kcal (100% protein needs and 95% minimum estimated kcal needs).  No pharmacy note yet today, but per note yesterday AM: 1) magnesium sulfate 1gm IV x1 2) At 1800 today:  continue Clinimix E 5/15 rate to 100 ml/hr (goal rate). Once CBGs improve, will transition over to Clinimix E 5/20  20% fat emulsion at 10 ml/hr.  TPN to contain standard multivitamins and trace elements.  Reduce IVF to [facility].  Continue sensitive SSI q6h  TPN lab panels on Mondays & Thursdays.  Meeting needs with TPN. Medications reviewed. Labs reviewed; CBGs: 139-230 mg/dL, Na: 131 mmol/L, Cl: 97 mmol/L, BUN elevated, Ca: 8.1 mg/dL.     9/1 - POD #1 I&D L back abscess, ex lap, LOA, small bowel resection, and placement of drain to L retroperitoneal abscess.  - NGT remains in place and ~375cc light brown-colored drainage present in canister at time of RD visit.  - He is currently receiving Clinimix E 5/15 @ 100 mL/hr with 20% lipids @ 10 mL/hr which is providing 120 grams protein, 2184 kcal (100% protein needs and 95% minimum estimated kcal needs). - Per pharmacy note yesterday: If patient tolerates clinimix E 5/15 at goal rate of 100 ml/hr and CBGs are within goal range, then will change to clinimix E 5/20 at goal rate of 90 ml/hr to obtain goal protein 108 g/day and total Kcal 2380. This would provide 98% minimum estimated protein needs and 100% kcal needs. -  It is preferential to meet 100% protein needs before meeting 100% kcal needs due to extensive surgery yesterday (8/31).   8/29 - Pt with NGT placed 8/26 which was clamped at time of RD visit with ~500cc drainage present in canister.  - Pt denies nausea, vomiting, or abdominal pain this AM.  - He states that since d/c earlier this month he has been eating and drinking without difficulty until about 3-4 days PTA. He states during this time he began having nausea  and vomiting with all PO intakes and that last episode of emesis was the day of or day after admission. - Per chart review, pt has lost 11 lbs (6% body weight) in the past 1 month which is significant for time frame. Mild muscle and fat wasting also noted. - Order currently in place for Clinimix E 5/15 @ 40 mL/hr with 20% lipids @ 10 mL/hr which provides 48 grams protein, 1162 kcal and was started yesterday at 1800. - Per pharmacy note yesterday, plan for goal of Clinimix E 5/15 with 20% lipids @ 10 mL/hr which will provide 120 grams protein, 2184 kcal which will meet protein needs and 95% kcal needs. - Clinimix E 5/20 @ 93 mL/hr with 20% lipids @ 10 mL/hr would better meet needs as it would provide 112 grams protein, 2444 kcal.   Diet Order:  Diet NPO time specified Except for: Sips with Meds .TPN (CLINIMIX-E) Adult  Skin:  Wound (see comment) (L back and abdominal surgical incisions)  Last BM:  8/31  Height:   Ht Readings from Last 1 Encounters:  11/24/14 5' 8"  (1.727 m)    Weight:   Wt Readings from Last 1 Encounters:  11/24/14 163 lb (73.936 kg)    Ideal Body Weight:  70 kg (kg)  BMI:  Body mass index is 24.79 kg/(m^2).  Estimated Nutritional Needs:   Kcal:  5974-1638  Protein:  110-120 grams  Fluid:  2.2-2.5 L/day  EDUCATION NEEDS:   No education needs identified at this time     Jarome Matin, RD, LDN Inpatient Clinical Dietitian Pager # [phone] After hours/weekend pager # [phone]

## 2014-12-01 NOTE — Progress Notes (Addendum)
Patient ID: Darryl Griffith, male   DOB: [birth year], 69 y.o.   MRN: [id number]     [facility]      [address] [place], [place] [address]    Phone: [phone] FAX: [phone]     Subjective: Pain improved.  Getting blood today.    Objective:  Vital signs:  Filed Vitals:   12/01/14 0140 12/01/14 0400 12/01/14 0458 12/01/14 0817  BP: 118/66  132/71 123/61  Pulse: 102  104 102  Temp: 98.3 F (36.8 C)  98.7 F (37.1 C) 98.2 F (36.8 C)  TempSrc: Oral  Oral Oral  Resp: 23 24 22 20   Height:      Weight:      SpO2: 100% 100% 100% 100%    Last BM Date: 11/28/14  Intake/Output   Yesterday:  09/01 0701 - 09/02 0700 In: 790 [I.V.:10; NG/GT:360] Out: 2825 [Urine:1450; Emesis/NG output:1300; Drains:75] This shift:  Total I/O In: -  Out: 225 [Drains:225]   Physical Exam: General: A&O x3 Abdomen: Soft. Sl distended.  Incision c/d/i.  Drain serosang.   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    Hypertension   Anemia of chronic disease   Abscess, retroperitoneal   Malnutrition of moderate degree   Hypokalemia    Results:   Labs: Results for orders placed or performed during the hospital encounter of 11/24/14 (from the past 48 hour(s))  Glucose, capillary     Status: Abnormal   Collection Time: 11/29/14  8:39 AM  Result Value Ref Range   Glucose-Capillary 124 (H) 65 - 99 mg/dL   Comment 1 Call MD NNP PA CNM   Culture, routine-abscess     Status: None (Preliminary result)   Collection Time: 11/29/14 10:23 AM  Result Value Ref Range   Specimen Description ABSCESS BACK    Special Requests NONE    Gram Stain      RARE WBC PRESENT, PREDOMINANTLY PMN NO SQUAMOUS EPITHELIAL CELLS SEEN NO ORGANISMS SEEN Performed at [facility]    Culture      NO GROWTH 2  DAYS Performed at [facility]    Report Status PENDING   Anaerobic culture     Status: None (Preliminary result)   Collection Time: 11/29/14 10:25 AM  Result Value Ref Range   Specimen Description ABSCESS BACK    Special Requests NONE    Gram Stain      RARE WBC PRESENT, PREDOMINANTLY PMN NO SQUAMOUS EPITHELIAL CELLS SEEN NO ORGANISMS SEEN Performed at [facility]; CULTURE IN PROGRESS FOR 5 DAYS Performed at [facility]    Report Status PENDING   Body fluid culture     Status: None (Preliminary result)   Collection Time: 11/29/14 11:42 AM  Result Value Ref Range   Specimen Description FLUID PERITONEAL LEFT PERITONEAL FLUID    Special Requests NONE    Gram Stain      FEW WBC PRESENT, PREDOMINANTLY PMN NO ORGANISMS SEEN    Culture      NO GROWTH < 24 HOURS Performed at [facility]    Report Status PENDING   Anaerobic culture     Status: None (Preliminary result)   Collection Time: 11/29/14 11:42 AM  Result Value Ref Range  Specimen Description FLUID PERITONEAL    Special Requests NONE    Gram Stain      FEW WBC PRESENT, PREDOMINANTLY PMN NO ORGANISMS SEEN    Culture      NO ANAEROBES ISOLATED; CULTURE IN PROGRESS FOR 5 DAYS Performed at [facility]    Report Status PENDING   Glucose, capillary     Status: Abnormal   Collection Time: 11/29/14  6:06 PM  Result Value Ref Range   Glucose-Capillary 230 (H) 65 - 99 mg/dL   Comment 1 Notify RN    Comment 2 Document in Chart   Glucose, capillary     Status: Abnormal   Collection Time: 11/29/14 11:32 PM  Result Value Ref Range   Glucose-Capillary 205 (H) 65 - 99 mg/dL  Glucose, capillary     Status: Abnormal   Collection Time: 11/30/14  5:01 AM  Result Value Ref Range   Glucose-Capillary 159 (H) 65 - 99 mg/dL  Comprehensive metabolic panel     Status: Abnormal   Collection Time: 11/30/14  5:30 AM  Result Value Ref Range   Sodium 130  (L) 135 - 145 mmol/L   Potassium 4.4 3.5 - 5.1 mmol/L   Chloride 99 (L) 101 - 111 mmol/L   CO2 26 22 - 32 mmol/L   Glucose, Bld 172 (H) 65 - 99 mg/dL   BUN 31 (H) 6 - 20 mg/dL   Creatinine, Ser 1.13 0.61 - 1.24 mg/dL   Calcium 7.6 (L) 8.9 - 10.3 mg/dL   Total Protein 4.4 (L) 6.5 - 8.1 g/dL   Albumin 1.8 (L) 3.5 - 5.0 g/dL   AST 14 (L) 15 - 41 U/L   ALT 10 (L) 17 - 63 U/L   Alkaline Phosphatase 40 38 - 126 U/L   Total Bilirubin 0.3 0.3 - 1.2 mg/dL   GFR calc non Af Amer >60 >60 mL/min   GFR calc Af Amer >60 >60 mL/min    Comment: (NOTE) The eGFR has been calculated using the CKD EPI equation. This calculation has not been validated in all clinical situations. eGFR's persistently <60 mL/min signify possible Chronic Kidney Disease.    Anion gap 5 5 - 15  Magnesium     Status: None   Collection Time: 11/30/14  5:30 AM  Result Value Ref Range   Magnesium 1.8 1.7 - 2.4 mg/dL  Phosphorus     Status: None   Collection Time: 11/30/14  5:30 AM  Result Value Ref Range   Phosphorus 3.4 2.5 - 4.6 mg/dL  CBC     Status: Abnormal   Collection Time: 11/30/14 10:30 AM  Result Value Ref Range   WBC 3.5 (L) 4.0 - 10.5 K/uL   RBC 3.15 (L) 4.22 - 5.81 MIL/uL   Hemoglobin 8.4 (L) 13.0 - 17.0 g/dL   HCT 25.4 (L) 39.0 - 52.0 %   MCV 80.6 78.0 - 100.0 fL   MCH 26.7 26.0 - 34.0 pg   MCHC 33.1 30.0 - 36.0 g/dL   RDW 17.4 (H) 11.5 - 15.5 %   Platelets 303 150 - 400 K/uL  Glucose, capillary     Status: Abnormal   Collection Time: 11/30/14 11:35 AM  Result Value Ref Range   Glucose-Capillary 151 (H) 65 - 99 mg/dL   Comment 1 Notify RN   Glucose, capillary     Status: Abnormal   Collection Time: 11/30/14  5:31 PM  Result Value Ref Range   Glucose-Capillary 148 (H) 65 - 99 mg/dL  Glucose, capillary     Status: Abnormal   Collection Time: 11/30/14 11:19 PM  Result Value Ref Range   Glucose-Capillary 146 (H) 65 - 99 mg/dL  Basic metabolic panel     Status: Abnormal   Collection Time: 12/01/14   4:53 AM  Result Value Ref Range   Sodium 131 (L) 135 - 145 mmol/L   Potassium 4.2 3.5 - 5.1 mmol/L   Chloride 97 (L) 101 - 111 mmol/L   CO2 28 22 - 32 mmol/L   Glucose, Bld 137 (H) 65 - 99 mg/dL   BUN 29 (H) 6 - 20 mg/dL   Creatinine, Ser 1.00 0.61 - 1.24 mg/dL   Calcium 8.1 (L) 8.9 - 10.3 mg/dL   GFR calc non Af Amer >60 >60 mL/min   GFR calc Af Amer >60 >60 mL/min    Comment: (NOTE) The eGFR has been calculated using the CKD EPI equation. This calculation has not been validated in all clinical situations. eGFR's persistently <60 mL/min signify possible Chronic Kidney Disease.    Anion gap 6 5 - 15  Glucose, capillary     Status: Abnormal   Collection Time: 12/01/14  5:01 AM  Result Value Ref Range   Glucose-Capillary 139 (H) 65 - 99 mg/dL  CBC     Status: Abnormal   Collection Time: 12/01/14  5:30 AM  Result Value Ref Range   WBC 2.9 (L) 4.0 - 10.5 K/uL   RBC 2.39 (L) 4.22 - 5.81 MIL/uL   Hemoglobin 6.4 (LL) 13.0 - 17.0 g/dL    Comment: RESULTS VERIFIED VIA RECOLLECT REPEATED TO VERIFY CRITICAL RESULT CALLED TO, READ BACK BY AND VERIFIED WITH: BETTY JONES,RN [id number] @ 0558 BY J SCOTTON    HCT 19.1 (L) 39.0 - 52.0 %   MCV 79.9 78.0 - 100.0 fL   MCH 26.8 26.0 - 34.0 pg   MCHC 33.5 30.0 - 36.0 g/dL   RDW 17.5 (H) 11.5 - 15.5 %   Platelets 298 150 - 400 K/uL  Prepare RBC     Status: None   Collection Time: 12/01/14  6:07 AM  Result Value Ref Range   Order Confirmation ORDER PROCESSED BY BLOOD BANK     Imaging / Studies: No results found.  Medications / Allergies:  Scheduled Meds: . sodium chloride   Intravenous Once  . sodium chloride   Intravenous Once  . albumin human  25 g Intravenous Q6H  . bisacodyl  10 mg Rectal Daily  . heparin subcutaneous  5,000 Units Subcutaneous 3 times per day  . HYDROmorphone PCA 0.3 mg/mL   Intravenous 6 times per day  . insulin aspart  0-9 Units Subcutaneous 4 times per day  . linezolid  600 mg Intravenous Q12H  . lip balm  1  application Topical BID  . methocarbamol (ROBAXIN)  IV  500 mg Intravenous 3 times per day  . metronidazole  500 mg Intravenous Q8H   Continuous Infusions: . Marland KitchenTPN (CLINIMIX-E) Adult 100 mL/hr at 11/30/14 1726   And  . fat emulsion 240 mL (11/30/14 1726)  . sodium chloride 10 mL/hr at 11/30/14 0426  . bupivacaine 0.25 % ON-Q pump DUAL CATH 300 mL     PRN Meds:.acetaminophen **OR** acetaminophen, alum & mag hydroxide-simeth, diphenhydrAMINE **OR** diphenhydrAMINE, HYDROmorphone (DILAUDID) injection, LORazepam, magic mouthwash, menthol-cetylpyridinium, metoprolol, naloxone **AND** sodium chloride, ondansetron **OR** ondansetron (ZOFRAN) IV, phenol, promethazine, sodium chloride  Antibiotics: Anti-infectives    Start     Dose/Rate Route Frequency Ordered Stop  11/29/14 0830  [MAR Hold]  ceFAZolin (ANCEF) IVPB 2 g/50 mL premix     (MAR Hold since 11/29/14 0825)   2 g 100 mL/hr over 30 Minutes Intravenous On call to O.R. 11/28/14 0850 11/29/14 0908   11/25/14 0000  metroNIDAZOLE (FLAGYL) IVPB 500 mg     500 mg 100 mL/hr over 60 Minutes Intravenous Every 8 hours 11/24/14 2355     11/25/14 0000  linezolid (ZYVOX) IVPB 600 mg     600 mg 300 mL/hr over 60 Minutes Intravenous Every 12 hours 11/24/14 2355          Assessment/Plan left retroperitoneal liposarcoma s/p resection and left nephrectomy 07/13/14 s/p XRT---Dr. Barry Dienes  Post op intra-abdominal abscesses(VRE) Recurrent SBO-NGT, NPO, TPN.   POD 1 ex lap/lysis of adhesions, small bowel resection. 11/29/2014  Oliguria - Improved.  On second day of 25% albumin q6 h to finish AM 9/3.  Pt with single kidney, so would be generous with IVF.  Neuro -  IV Tylenol and robaxin for pain control.  Would avoid toradol given single kidney.  Increase availability of rescue doses.  Change to custom dose PCA.    ID-linezolid/flagyl for VRE.  IR perc aspiration cx pending.  Cx pending from OR.    PCM-prealbumin 12, TPN. Alb 1.8, getting some IV  albumin.  Acute on chronic anemia-Getting blood today.  NGT output slightly bloody.  Add BID protonix.    Dispo-await return of bowel function.     12/01/2014  8:29 AM

## 2014-12-01 NOTE — Progress Notes (Signed)
PROGRESS NOTE  TRUE Garciamartinez EXH:[id number] DOB: [birth year] DOA: 11/24/2014 PCP: Gennette Pac, MD  HPI: Darryl Griffith is a 69 y.o. 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 General surgery was consulted on admission. Patient did not improve with conservative measures and is scheduled to undergo surgery today 8/31. He underwent incision and drainage of left back abscess, exploratory laparotomy, lysis of adhesions, small bowel resection and placement of drain into left retroperitoneal abscess.   Subjective / 24 H Interval events  - reports some abdominal pain. No nausea since yesterday.    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 Gram stain negative.  - appreciate ID recommendations.   HTN - prn hydralazine - BP  stable  Hypokalemia - stable  Hypomagnesemia - improved with repletion  Anemia - monitor CBC, likely of chronic illness, and from anemia of blood loss.  - one unit of PRBC transfusion ordered, and repeat H&h pending.  - protonix added by surgery.  Repeat CBC in am.   Hyponatremia: Post op , will need hydration and repeat in am show slight improvement. .    Diet: Diet NPO time specified Except for: Sips with Meds .TPN (CLINIMIX-E) Adult Fluids: NS DVT Prophylaxis: SCD  Code Status: Full Code Family Communication: no family bedside  Disposition Plan: remain inpatient  Consultants:  General surgery   ID  Procedures:  None    Antibiotics  Anti-infectives    Start     Dose/Rate Route Frequency Ordered Stop   11/29/14 0830  [MAR Hold]  ceFAZolin (ANCEF) IVPB 2 g/50 mL premix     (MAR Hold since 11/29/14 0825)   2 g 100 mL/hr over 30 Minutes Intravenous On call to O.R. 11/28/14 0850 11/29/14 0908   11/25/14 0000  metroNIDAZOLE (FLAGYL) IVPB 500 mg     500 mg 100 mL/hr over 60 Minutes Intravenous Every 8 hours 11/24/14 2355     11/25/14 0000  linezolid (ZYVOX) IVPB 600 mg     600 mg 300 mL/hr over 60 Minutes Intravenous Every 12 hours 11/24/14 2355        Studies No results found. Objective  Filed Vitals:   12/01/14 0400 12/01/14 0458 12/01/14 0817 12/01/14 0855  BP:  132/71 123/61 136/61  Pulse:  104 102 102  Temp:  98.7 F (37.1 C) 98.2 F (36.8 C) 98.8 F (37.1 C)  TempSrc:  Oral Oral Oral  Resp: 24 22  20 20  Height:      Weight:      SpO2: 100% 100% 100% 100%    Intake/Output Summary (Last 24 hours) at 12/01/14 0935 Last data filed at 12/01/14 0909  Gross per 24 hour  Intake    730 ml  Output   4250 ml  Net  -3520 ml   Filed Weights   11/24/14 2020  Weight: 73.936 kg (163 lb)   Exam:  GENERAL: NAD, NG tube in place  LUNGS: Clear to auscultation. No wheezing or crackles  HEART: Regular rate and rhythm without murmur. 2+ pulses, no  JVD, no peripheral edema  ABDOMEN: Soft, nontender, and nondistended. No bowel sounds.   EXTREMITIES: Without any cyanosis, clubbing, rash, lesions or edema.  Data Reviewed: Basic Metabolic Panel:  Recent Labs Lab 11/25/14 0530 11/26/14 0418 11/27/14 0408 11/29/14 0440 11/30/14 0530 12/01/14 0453  NA 132* 135 134* 135 130* 131*  K 3.4* 3.3* 3.5 4.1 4.4 4.2  CL 102 104 104 100* 99* 97*  CO2 23 25 25 29 26 28   GLUCOSE 90 88 125* 127* 172* 137*  BUN 12 12 13 18  31* 29*  CREATININE 1.14 1.14 1.03 0.91 1.13 1.00  CALCIUM 8.4* 8.0* 8.0* 8.2* 7.6* 8.1*  MG 1.5* 1.9 1.7  --  1.8  --   PHOS  --   --  2.1*  --  3.4  --    Liver Function Tests:  Recent Labs Lab 11/24/14 2130 11/27/14 0408 11/30/14 0530  AST 32 25 14*  ALT 21 17 10*  ALKPHOS 63 51 40  BILITOT 0.3 0.4 0.3  PROT 5.7* 5.0* 4.4*  ALBUMIN 2.5* 2.3* 1.8*    Recent Labs Lab 11/24/14 2130  LIPASE 47   CBC:  Recent Labs Lab 11/24/14 2130 11/25/14 0530 11/27/14 0408 11/29/14 0440 11/30/14 1030 12/01/14 0530  WBC 5.8 5.3 4.0 3.5* 3.5* 2.9*  NEUTROABS 4.7  --  3.1  --   --   --   HGB 8.6* 9.0* 7.8* 8.5* 8.4* 6.4*  HCT 26.7* 26.5* 24.7* 25.8* 25.4* 19.1*  MCV 80.7 80.1 81.0 80.6 80.6 79.9  PLT 447* 453* 366 352 303 298   Scheduled Meds: . sodium chloride   Intravenous Once  . sodium chloride   Intravenous Once  . albumin human  25 g Intravenous Q6H  . bisacodyl  10 mg Rectal Daily  . heparin subcutaneous  5,000 Units Subcutaneous 3 times per day  . HYDROmorphone PCA 0.3 mg/mL   Intravenous 6 times per day  . insulin aspart  0-9 Units Subcutaneous 4 times per day  . linezolid  600 mg Intravenous Q12H  . lip balm  1 application Topical BID  . methocarbamol (ROBAXIN)  IV  500 mg Intravenous 3 times per day  . metronidazole  500 mg Intravenous Q8H  . pantoprazole (PROTONIX) IV  40 mg Intravenous Q12H   Continuous Infusions: . Marland KitchenTPN (CLINIMIX-E) Adult 100 mL/hr at 11/30/14 1726   And  . fat emulsion  240 mL (11/30/14 1726)  . sodium chloride 10 mL/hr at 11/30/14 0426  . bupivacaine 0.25 % ON-Q pump DUAL CATH 300 mL      Hosie Poisson, MD [facility] Pager [phone]. If 7 PM - 7 AM, please contact night-coverage at [email or web address], password [facility] 12/01/2014, 9:35 AM  LOS: 7 days

## 2014-12-01 NOTE — Progress Notes (Signed)
CRITICAL VALUE ALERT  Critical value received:  HGB 6.4  Date of notification:  12/01/14  Time of notification:  0600  Critical value read back:yes  Nurse who received alert:  Jearld Shines  MD notified (1st page):  Rogue Bussing  Time of first page: 0602  MD notified (2nd page):  Time of second page:  Responding MD:   Time MD responded:

## 2014-12-01 NOTE — Progress Notes (Signed)
Patient ID: Darryl Griffith, male   DOB: 12-09-1945, 69 y.o.   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 sodium chloride   Intravenous Once  . sodium chloride   Intravenous Once  . albumin human  25 g Intravenous Q6H  . bisacodyl  10 mg Rectal Daily  . heparin subcutaneous  5,000 Units Subcutaneous 3 times per day  . HYDROmorphone PCA 0.3 mg/mL   Intravenous 6 times per day  . insulin aspart  0-9 Units Subcutaneous 4 times per day  . linezolid  600 mg Intravenous Q12H  . lip balm  1 application Topical BID  . methocarbamol (ROBAXIN)  IV  500 mg Intravenous 3 times per day  . metronidazole  500 mg Intravenous Q8H  . pantoprazole (PROTONIX) IV  40 mg Intravenous Q12H    SUBJECTIVE: He says he is weak but without pain.  Review of Systems: Pertinent items are noted in HPI.  Past Medical History  Diagnosis Date  . Hypertension   . Anginal pain     hx of CP saw Dr Wynonia Lawman 09/20/2013 H&P per chart   . Left Retroperitoneal mass s/p resection/L nephrectomy 07/13/2014   . Postoperative anemia due to acute blood loss 07/15/2014  . SBO (small bowel obstruction) 08/01/2014, 10/11/14  . Retroperitoneal fluid collection   . 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 Impaired fasting glucose 11/25/2014  . Benign neoplasm of colon 11/25/2014  . Actinic keratosis 11/25/2014  . Mixed hyperlipidemia 11/25/2014    Social History  Substance Use Topics  . Smoking  status: Never Smoker   . Smokeless tobacco: Never Used  . Alcohol Use: No    Family History  Problem Relation Age of Onset  . Adopted: Yes  . Family history unknown: Yes   Allergies  Allergen Reactions  . Compazine [Prochlorperazine] Other (See Comments)    Made pt really lethargic, and sleepy    OBJECTIVE: Filed Vitals:   12/01/14 0845 12/01/14 0855 12/01/14 1122 12/01/14 1124  BP:  136/61 162/77   Pulse:  102 106   Temp:  98.8 F (37.1 C) 98.4 F (36.9 C)   TempSrc:  Oral Oral   Resp: 20 20 27 20   Height:      Weight:      SpO2: 98% 100% 100% 99%   Body mass index is 24.79 kg/(m^2).  General: He is resting quietly in bed. He is visiting with family. Skin: No rash  Lungs: Clear  Cor: Regular S1 and S2 with no murmur Abdomen: Soft. 725 mL of drainage output recorded on first shift  Lab Results Lab Results  Component Value Date   WBC 2.9* 12/01/2014   HGB 6.4* 12/01/2014   HCT 19.1* 12/01/2014   MCV 79.9 12/01/2014   PLT 298 12/01/2014    Lab Results  Component Value  Date   CREATININE 1.00 12/01/2014   BUN 29* 12/01/2014   NA 131* 12/01/2014   K 4.2 12/01/2014   CL 97* 12/01/2014   CO2 28 12/01/2014    Lab Results  Component Value Date   ALT 10* 11/30/2014   AST 14* 11/30/2014   ALKPHOS 40 11/30/2014   BILITOT 0.3 11/30/2014     Microbiology: Recent Results (from the past 240 hour(s))  Culture, routine-abscess     Status: None   Collection Time: 11/27/14 12:22 PM  Result Value Ref Range Status   Specimen Description   Final    ABSCESS ASPIRATION OF RECURRENT LEFT RETROPERITONEAL FLUID. Nephrectomy   Special Requests Normal  Final   Gram Stain   Final    ABUNDANT WBC PRESENT,BOTH PMN AND MONONUCLEAR NO SQUAMOUS EPITHELIAL CELLS SEEN NO ORGANISMS SEEN Performed at [facility]    Culture   Final    NO GROWTH 3 DAYS Performed at [facility]    Report Status 11/30/2014 FINAL  Final  Anaerobic culture     Status: None  (Preliminary result)   Collection Time: 11/27/14 12:22 PM  Result Value Ref Range Status   Specimen Description ABSCESS LEFT RETROPERITONEAL FLUID. Nephrectomy   Final   Special Requests Normal  Final   Gram Stain   Final    ABUNDANT WBC PRESENT,BOTH PMN AND MONONUCLEAR NO SQUAMOUS EPITHELIAL CELLS SEEN NO ORGANISMS SEEN Performed at [facility]    Culture   Final    NO ANAEROBES ISOLATED; CULTURE IN PROGRESS FOR 5 DAYS Performed at [facility]    Report Status PENDING  Incomplete  Fungus culture w smear     Status: None (Preliminary result)   Collection Time: 11/27/14 12:22 PM  Result Value Ref Range Status   Specimen Description ABSCESS LEFT RETROPERITONEAL FLUID/Nephrectomy  Final   Special Requests Normal  Final   Fungal Smear   Final    NO YEAST OR FUNGAL ELEMENTS SEEN Performed at [facility]    Culture   Final    CULTURE IN PROGRESS FOR FOUR WEEKS Performed at [facility]    Report Status PENDING  Incomplete  Surgical pcr screen     Status: None   Collection Time: 11/29/14  6:02 AM  Result Value Ref Range Status   MRSA, PCR NEGATIVE NEGATIVE Final   Staphylococcus aureus NEGATIVE NEGATIVE Final    Comment:        The Xpert SA Assay (FDA approved for NASAL specimens in patients over 85 years of age), is one component of a comprehensive surveillance program.  Test performance has been validated by [facility] for patients greater than or equal to 42 year old. It is not intended to diagnose infection nor to guide or monitor treatment.   Culture, routine-abscess     Status: None (Preliminary result)   Collection Time: 11/29/14 10:23 AM  Result Value Ref Range Status   Specimen Description ABSCESS BACK  Final   Special Requests NONE  Final   Gram Stain   Final    RARE WBC PRESENT, PREDOMINANTLY PMN NO SQUAMOUS EPITHELIAL CELLS SEEN NO ORGANISMS SEEN Performed at [facility]    Culture   Final    NO GROWTH 2  DAYS Performed at [facility]    Report Status PENDING  Incomplete  Anaerobic culture     Status: None (Preliminary result)   Collection Time: 11/29/14 10:25 AM  Result Value Ref Range Status  Specimen Description ABSCESS BACK  Final   Special Requests NONE  Final   Gram Stain   Final    RARE WBC PRESENT, PREDOMINANTLY PMN NO SQUAMOUS EPITHELIAL CELLS SEEN NO ORGANISMS SEEN Performed at [facility]    Culture   Final    NO ANAEROBES ISOLATED; CULTURE IN PROGRESS FOR 5 DAYS Performed at [facility]    Report Status PENDING  Incomplete  Body fluid culture     Status: None (Preliminary result)   Collection Time: 11/29/14 11:42 AM  Result Value Ref Range Status   Specimen Description FLUID PERITONEAL LEFT PERITONEAL FLUID  Final   Special Requests NONE  Final   Gram Stain   Final    FEW WBC PRESENT, PREDOMINANTLY PMN NO ORGANISMS SEEN    Culture   Final    NO GROWTH < 24 HOURS Performed at [facility]    Report Status PENDING  Incomplete  Anaerobic culture     Status: None (Preliminary result)   Collection Time: 11/29/14 11:42 AM  Result Value Ref Range Status   Specimen Description FLUID PERITONEAL  Final   Special Requests NONE  Final   Gram Stain   Final    FEW WBC PRESENT, PREDOMINANTLY PMN NO ORGANISMS SEEN    Culture   Final    NO ANAEROBES ISOLATED; CULTURE IN PROGRESS FOR 5 DAYS Performed at [facility]    Report Status PENDING  Incomplete     ASSESSMENT: Repeat cultures from retroperitoneal abscess and retroperitoneal fluid collection remain negative.  PLAN: 1. Continue current antibiotics 2. Please call Dr. Tommy Medal [phone]) for any infectious disease questions over the holiday weekend  Michel Bickers, MD [facility] for [place] [phone] pager   [phone] cell 12/01/2014, 11:46 AM

## 2014-12-02 LAB — CULTURE, ROUTINE-ABSCESS: Culture: NO GROWTH

## 2014-12-02 LAB — CBC
HEMATOCRIT: 21 % — AB (ref 39.0–52.0)
HEMOGLOBIN: 7 g/dL — AB (ref 13.0–17.0)
MCH: 27.6 pg (ref 26.0–34.0)
MCHC: 33.3 g/dL (ref 30.0–36.0)
MCV: 82.7 fL (ref 78.0–100.0)
Platelets: 281 10*3/uL (ref 150–400)
RBC: 2.54 MIL/uL — AB (ref 4.22–5.81)
RDW: 18.3 % — ABNORMAL HIGH (ref 11.5–15.5)
WBC: 1.6 10*3/uL — AB (ref 4.0–10.5)

## 2014-12-02 LAB — GLUCOSE, CAPILLARY
GLUCOSE-CAPILLARY: 163 mg/dL — AB (ref 65–99)
GLUCOSE-CAPILLARY: 135 mg/dL — AB (ref 65–99)
Glucose-Capillary: 114 mg/dL — ABNORMAL HIGH (ref 65–99)
Glucose-Capillary: 144 mg/dL — ABNORMAL HIGH (ref 65–99)
Glucose-Capillary: 148 mg/dL — ABNORMAL HIGH (ref 65–99)

## 2014-12-02 LAB — BODY FLUID CULTURE: CULTURE: NO GROWTH

## 2014-12-02 LAB — ANAEROBIC CULTURE: SPECIAL REQUESTS: NORMAL

## 2014-12-02 LAB — DIFFERENTIAL
BASOS ABS: 0 10*3/uL (ref 0.0–0.1)
Basophils Relative: 1 % (ref 0–1)
EOS ABS: 0.2 10*3/uL (ref 0.0–0.7)
EOS PCT: 12 % — AB (ref 0–5)
LYMPHS ABS: 0.2 10*3/uL — AB (ref 0.7–4.0)
Lymphocytes Relative: 13 % (ref 12–46)
MONO ABS: 0.5 10*3/uL (ref 0.1–1.0)
Monocytes Relative: 29 % — ABNORMAL HIGH (ref 3–12)
NEUTROS PCT: 45 % (ref 43–77)
Neutro Abs: 0.7 10*3/uL — ABNORMAL LOW (ref 1.7–7.7)

## 2014-12-02 MED ORDER — FAT EMULSION 20 % IV EMUL
240.0000 mL | INTRAVENOUS | Status: AC
  Administered 2014-12-02: 240 mL via INTRAVENOUS
  Filled 2014-12-02: qty 250

## 2014-12-02 MED ORDER — TRACE MINERALS CR-CU-MN-SE-ZN 10-1000-500-60 MCG/ML IV SOLN
INTRAVENOUS | Status: AC
  Administered 2014-12-02: 18:00:00 via INTRAVENOUS
  Filled 2014-12-02: qty 2232

## 2014-12-02 NOTE — Progress Notes (Signed)
PARENTERAL NUTRITION CONSULT NOTE - Follow-Up  Pharmacy Consult for TPN Indication: SBO  Allergies  Allergen Reactions  . Compazine [Prochlorperazine] Other (See Comments)    Made pt really lethargic, and sleepy   Patient Measurements: Height: _0  (172.7 cm) Weight: 163 lb (73.936 kg) IBW/kg (Calculated) : 68.4  Vital Signs: Temp: 98.1 F (36.7 C) (09/03 0946) Temp Source: Oral (09/03 0946) BP: 138/71 mmHg (09/03 0946) Pulse Rate: 110 (09/03 0946) Intake/Output from previous day: 09/02 0701 - 09/03 0700 In: 825 [I.V.:10; Blood:335; NG/GT:240] Out: 3945 [Urine:1000; Emesis/NG output:2000; Drains:945] Intake/Output from this shift: Total I/O In: -  Out: 300 [Urine:200; Emesis/NG output:100]  Labs:  Recent Labs  11/30/14 1030 12/01/14 0530 12/01/14 1550  WBC 3.5* 2.9*  --   HGB 8.4* 6.4* 7.4*  HCT 25.4* 19.1* 21.9*  PLT 303 298  --     Recent Labs  11/30/14 0530 12/01/14 0453  NA 130* 131*  K 4.4 4.2  CL 99* 97*  CO2 26 28  GLUCOSE 172* 137*  BUN 31* 29*  CREATININE 1.13 1.00  CALCIUM 7.6* 8.1*  MG 1.8  --   PHOS 3.4  --   PROT 4.4*  --   ALBUMIN 1.8*  --   AST 14*  --   ALT 10*  --   ALKPHOS 40  --   BILITOT 0.3  --    Estimated Creatinine Clearance: 66.5 mL/min (by C-G formula based on Cr of 1).    Recent Labs  12/02/14 0612 12/02/14 0743 12/02/14 1152  GLUCAP 144* 114* 163*    Medical History: Past Medical History  Diagnosis Date  . Hypertension   . Anginal pain     hx of CP saw Dr Wynonia Lawman 09/20/2013 H&P per chart   . Left Retroperitoneal mass s/p resection/L nephrectomy 07/13/2014   . Postoperative anemia due to acute blood loss 07/15/2014  . SBO (small bowel obstruction) 08/01/2014, 10/11/14  . Retroperitoneal fluid collection   . 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 Impaired fasting glucose 11/25/2014  . Benign neoplasm of colon 11/25/2014  . Actinic  keratosis 11/25/2014  . Mixed hyperlipidemia 11/25/2014   Medications:  Scheduled:  . sodium chloride   Intravenous Once  . sodium chloride   Intravenous Once  . bisacodyl  10 mg Rectal Daily  . heparin subcutaneous  5,000 Units Subcutaneous 3 times per day  . HYDROmorphone PCA 0.3 mg/mL   Intravenous 6 times per day  . insulin aspart  0-9 Units Subcutaneous 4 times per day  . linezolid  600 mg Intravenous Q12H  . lip balm  1 application Topical BID  . methocarbamol (ROBAXIN)  IV  500 mg Intravenous 3 times per day  . metronidazole  500 mg Intravenous Q8H  . pantoprazole (PROTONIX) IV  40 mg Intravenous Q12H   Infusions:  . sodium chloride 10 mL/hr at 11/30/14 0426  . bupivacaine 0.25 % ON-Q pump DUAL CATH 300 mL    . Marland KitchenTPN (CLINIMIX-E) Adult 93 mL/hr at 12/01/14 1803   And  . fat emulsion 240 mL (12/01/14 1803)   Insulin Requirements: 3 units over the past 24 hours (on sensitive SSI q6h) - no hx DM  Current Nutrition: NPO - NG tube   IVF: NS at [facility] access: PICC placed 10/12/14 TPN start date: 8/28  ASSESSMENT  HPI:  12 yoM s/p resection L retroperitoneal sarcoma, L nephrectomy 07/13/14 developed VRE abscess treated with po Linezolid/Flagyl. Hx abd bloating, N/V 2 days PTA, SBO seen on CT for Ca restaging.  Hx recurrent SBO. Begin TPN per Pharmacy  Significant events:   8/29: plan for exp laparotomy on 8/31; s/p aspiration of L retroperitoneal fluid collection on 8/29 8/31:  s/p exp lap -->  I&D L back abscess, lysis of adhesions, small bowel resection, placement og drain into L retroperitoneal abscess 9/2: bloody NGT output, PPI IV BID added;  change clinimix E 5/15 to E 5/20  Today:   Glucose (goal <150): all readings <150 except for one (163)  Electrolytes - no new labs today; Na low at 131;  K, Corected Ca WNL (9/2)  Renal - WNL (9/2)  LFTs - AST/ALT  low, Alk Phos, Tbili WNL (9/1)  TGs - 132 (8/29)  Prealbumin - 12.1 (8/29)  NUTRITIONAL GOALS                                                                                             RD recs:   Kcal: 2300-2500  Protein: 110-120 grams  Fluid: 2.2-2.5 L/day  Clinimix E 5/20 at a goal rate of 93 ml/hr + 20% fat emulsion at 10 ml/hr to provide: 112 g/day protein, 2444 Kcal/day (per dietician recommendations)   PLAN                                                                                                                         At 1800 today:  Continue Clinimix E 5/20 at goal rate of 93 ml/hr.  20% fat emulsion at 10 ml/hr.  TPN to contain standard multivitamins and trace elements.  Continue [facility] at [facility].  Continue sensitive SSI q6h-will consider change to moderate scale if CBGs increase/elevated.  TPN lab panels on Mondays & Thursdays.  F/u daily.    Lindell Spar, PharmD, BCPS Pager: [phone] 12/02/2014 12:14 PM

## 2014-12-02 NOTE — Progress Notes (Signed)
PROGRESS NOTE  Darryl Griffith LOV:[id number] DOB: 01-19-1946 DOA: 11/24/2014 PCP: Gennette Pac, MD  HPI: Darryl Griffith is a 69 y.o. male with a history of Left Flank Area Liposarcoma S/P Resection and Left Nephrectomy 06/2014 with Radiation Rx completed on 88/4166 complicated by abscess formation (+VRE), s/p drain placement for ~ 3 weeks (removed 8/2), was admitted on 8/26 with nausea/vomiting found to have SBO. General surgery was consulted on admission. Patient did not improve with conservative measures and is scheduled to undergo surgery today 8/31. He underwent incision and drainage of left back abscess, exploratory laparotomy, lysis of adhesions, small bowel resection and placement of drain into left retroperitoneal abscess.   Subjective / 24 H Interval events  Does nto want to get OOB, denies palpitations, pain appears be getting better.   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 Gram stain negative.  - appreciate ID recommendations.   HTN - prn  hydralazine - BP stable  Hypokalemia - stable  Hypomagnesemia - improved with repletion  Anemia - monitor CBC, likely of chronic illness, and from anemia of blood loss.  - one unit of PRBC transfusion ordered, and repeat H&h is around 7..  - protonix added by surgery.  Repeat CBC in am shows hemoglobin of 7.   Hyponatremia: Post op , will need hydration and repeat in am show slight improvement. .    Neutropenia: suspision of myelosuppresion by antibiotics. Would recommend repeat CBC with differential in am and discuss with ID reg antibiotics.   TAchycardia: Worsened over the last 24 hours. bp slightly high , his pain appears to be getting better. He is asymptomatic. EKG shows sinus tachycardia.    Diet: Diet NPO time specified Except for: Sips with Meds TPN (CLINIMIX-E) Adult Fluids: NS DVT Prophylaxis: SCD  Code Status: Full Code Family Communication: no family bedside  Disposition Plan: remain inpatient  Consultants:  General surgery   ID  Procedures:  incision and drainage of left back abscess, exploratory laparotomy, lysis of adhesions, small bowel resection and placement of drain into left retroperitoneal abscess.  Antibiotics  Anti-infectives    Start     Dose/Rate Route Frequency Ordered Stop   11/29/14 0830  [MAR Hold]  ceFAZolin (ANCEF) IVPB 2 g/50 mL premix     (MAR Hold since 11/29/14 0825)   2 g 100 mL/hr over 30 Minutes Intravenous On call to O.R. 11/28/14 0850 11/29/14 0908   11/25/14 0000  metroNIDAZOLE (FLAGYL) IVPB 500 mg     500 mg 100 mL/hr over 60 Minutes Intravenous Every 8 hours 11/24/14 2355     11/25/14 0000  linezolid (ZYVOX) IVPB 600 mg  600 mg 300 mL/hr over 60 Minutes Intravenous Every 12 hours 11/24/14 2355        Studies Ir Venipuncture 70yrs/older By Md  12/01/2014   CLINICAL DATA:  Anemia, difficult access, poor peripheral veins, access for blood transfusion  EXAM: ULTRASOUND GUIDANCE FOR VASCULAR ACCESS  PERIPHERAL IV  START BY PHYSICIAN.  Date:  9/2/20169/05/2014 5:13 pm  Radiologist:  M. Daryll Brod, MD  Guidance:  Ultrasound  FLUOROSCOPY TIME:  None.  MEDICATIONS AND MEDICAL HISTORY: None.  ANESTHESIA/SEDATION: None.  CONTRAST:  None.  COMPLICATIONS: None immediate  PROCEDURE: Informed consent was obtained from the patient following explanation of the procedure, risks, benefits and alternatives. The patient understands, agrees and consents for the procedure. All questions were addressed. A time out was performed.  Maximal barrier sterile technique utilized including caps, mask, sterile gowns, sterile gloves, large sterile drape, hand hygiene, and ChloraPrep  Preliminary ultrasound performed. Left basilic vein demonstrated. Under sterile conditions and local anesthesia, a 16 gauge Angiocath was advanced into the left basilic vein under ultrasound. Position confirmed with ultrasound. Images obtained for documentation. Blood aspirated easily followed by saline flush. This was secured externally with a sterile dressing. Ready for use.  IMPRESSION: Successful ultrasound left upper extremity basilic vein peripheral IV start.   Electronically Signed   By: [name] M.D.   On: 12/01/2014 17:19   Ir US Guide Vasc Access Left  12/01/2014   CLINICAL DATA:  Anemia, difficult access, poor peripheral veins, access for blood transfusion  EXAM: ULTRASOUND GUIDANCE FOR VASCULAR ACCESS  PERIPHERAL IV START BY PHYSICIAN.  Date:  9/2/20169/05/2014 5:13 pm  Radiologist:  M. Daryll Brod, MD  Guidance:  Ultrasound  FLUOROSCOPY TIME:  None.  MEDICATIONS AND MEDICAL HISTORY: None.  ANESTHESIA/SEDATION: None.  CONTRAST:  None.  COMPLICATIONS: None immediate  PROCEDURE: Informed consent was obtained from the patient following explanation of the procedure, risks, benefits and alternatives. The patient understands, agrees and consents for the procedure. All questions were addressed. A time out was performed.  Maximal barrier sterile technique utilized  including caps, mask, sterile gowns, sterile gloves, large sterile drape, hand hygiene, and ChloraPrep  Preliminary ultrasound performed. Left basilic vein demonstrated. Under sterile conditions and local anesthesia, a 16 gauge Angiocath was advanced into the left basilic vein under ultrasound. Position confirmed with ultrasound. Images obtained for documentation. Blood aspirated easily followed by saline flush. This was secured externally with a sterile dressing. Ready for use.  IMPRESSION: Successful ultrasound left upper extremity basilic vein peripheral IV start.   Electronically Signed   By: [name] M.D.   On: 12/01/2014 17:19   Objective  Filed Vitals:   12/02/14 0330 12/02/14 0630 12/02/14 0946 12/02/14 1500  BP:  152/71 138/71 162/70  Pulse:  102 110 120  Temp:  98.7 F (37.1 C) 98.1 F (36.7 C) 98.6 F (37 C)  TempSrc:  Oral Oral Oral  Resp: 33 24 20 20   Height:      Weight:      SpO2: 96% 99% 96% 98%    Intake/Output Summary (Last 24 hours) at 12/02/14 1922 Last data filed at 12/02/14 0947  Gross per 24 hour  Intake      0 ml  Output   1820 ml  Net  -1820 ml   Filed Weights   11/24/14 2020  Weight: 73.936 kg (163 lb)   Exam:  GENERAL: NAD, NG tube in place  LUNGS: Clear to auscultation. No wheezing or crackles  HEART: Regular rate  and rhythm without murmur. 2+ pulses, no JVD, no peripheral edema  ABDOMEN: Soft, nontender, and nondistended. No bowel sounds.   EXTREMITIES: Without any cyanosis, clubbing, rash, lesions or edema.  Data Reviewed: Basic Metabolic Panel:  Recent Labs Lab 11/26/14 0418 11/27/14 0408 11/29/14 0440 11/30/14 0530 12/01/14 0453  NA 135 134* 135 130* 131*  K 3.3* 3.5 4.1 4.4 4.2  CL 104 104 100* 99* 97*  CO2 25 25 29 26 28   GLUCOSE 88 125* 127* 172* 137*  BUN 12 13 18  31* 29*  CREATININE 1.14 1.03 0.91 1.13 1.00  CALCIUM 8.0* 8.0* 8.2* 7.6* 8.1*  MG 1.9 1.7  --  1.8  --   PHOS  --  2.1*  --  3.4  --    Liver Function  Tests:  Recent Labs Lab 11/27/14 0408 11/30/14 0530  AST 25 14*  ALT 17 10*  ALKPHOS 51 40  BILITOT 0.4 0.3  PROT 5.0* 4.4*  ALBUMIN 2.3* 1.8*   No results for input(s): LIPASE, AMYLASE in the last 168 hours. CBC:  Recent Labs Lab 11/27/14 0408 11/29/14 0440 11/30/14 1030 12/01/14 0530 12/01/14 1550 12/02/14 1140  WBC 4.0 3.5* 3.5* 2.9*  --  1.6*  NEUTROABS 3.1  --   --   --   --  0.7*  HGB 7.8* 8.5* 8.4* 6.4* 7.4* 7.0*  HCT 24.7* 25.8* 25.4* 19.1* 21.9* 21.0*  MCV 81.0 80.6 80.6 79.9  --  82.7  PLT 366 352 303 298  --  281   Scheduled Meds: . sodium chloride   Intravenous Once  . sodium chloride   Intravenous Once  . bisacodyl  10 mg Rectal Daily  . heparin subcutaneous  5,000 Units Subcutaneous 3 times per day  . HYDROmorphone PCA 0.3 mg/mL   Intravenous 6 times per day  . insulin aspart  0-9 Units Subcutaneous 4 times per day  . linezolid  600 mg Intravenous Q12H  . lip balm  1 application Topical BID  . methocarbamol (ROBAXIN)  IV  500 mg Intravenous 3 times per day  . metronidazole  500 mg Intravenous Q8H  . pantoprazole (PROTONIX) IV  40 mg Intravenous Q12H   Continuous Infusions: . sodium chloride 10 mL/hr at 11/30/14 0426  . bupivacaine 0.25 % ON-Q pump DUAL CATH 300 mL    . Marland KitchenTPN (CLINIMIX-E) Adult 93 mL/hr at 12/02/14 1747   And  . fat emulsion 240 mL (12/02/14 1747)    Hosie Poisson, MD [facility] Pager [phone]. If 7 PM - 7 AM, please contact night-coverage at [email or web address], password [facility] 12/02/2014, 7:22 PM  LOS: 8 days

## 2014-12-02 NOTE — Progress Notes (Signed)
Patient ID: Darryl Griffith, male   DOB: [birth year], 69 y.o.   MRN: [id number]  [facility]  POD#: 3  Subjective: Patient in bed, using PCA.  Has not been OOB or up to chair yet.  No flatus or BM per patient.  NG with thin bilious output.  Objective: Vital signs in last 24 hours: Temp:  [98.1 F (36.7 C)-99.1 F (37.3 C)] 98.1 F (36.7 C) (09/03 0946) Pulse Rate:  [102-116] 110 (09/03 0946) Resp:  [17-33] 20 (09/03 0946) BP: (128-180)/(61-77) 138/71 mmHg (09/03 0946) SpO2:  [96 %-100 %] 96 % (09/03 0946) Last BM Date: 11/28/14  Intake/Output from previous day: 09/02 0701 - 09/03 0700 In: 825 [I.V.:10; Blood:335; NG/GT:240] Out: 3945 [Urine:1000; Emesis/NG output:2000; Drains:945] Intake/Output this shift: Total I/O In: -  Out: 200 [Urine:200]  Physical Exam: HEENT - sclerae clear, mucous membranes moist Neck - soft Chest - clear bilaterally Cor - RRR Abdomen - soft, mild distension; diffuse tenderness per patient; wound dry and intact; JP drain with serosanguinous  Lab Results:   Recent Labs  11/30/14 1030 12/01/14 0530 12/01/14 1550  WBC 3.5* 2.9*  --   HGB 8.4* 6.4* 7.4*  HCT 25.4* 19.1* 21.9*  PLT 303 298  --    BMET  Recent Labs  11/30/14 0530 12/01/14 0453  NA 130* 131*  K 4.4 4.2  CL 99* 97*  CO2 26 28  GLUCOSE 172* 137*  BUN 31* 29*  CREATININE 1.13 1.00  CALCIUM 7.6* 8.1*   PT/INR No results for input(s): LABPROT, INR in the last 72 hours. Comprehensive Metabolic Panel:    Component Value Date/Time   NA 131* 12/01/2014 0453   NA 130* 11/30/2014 0530   NA 131* 09/18/2014 0959   K 4.2 12/01/2014 0453   K 4.4 11/30/2014 0530   K 4.4 09/18/2014 0959   CL 97* 12/01/2014 0453   CL 99* 11/30/2014 0530   CO2 28 12/01/2014 0453   CO2 26 11/30/2014 0530   CO2 26 09/18/2014 0959   BUN 29* 12/01/2014 0453   BUN 31* 11/30/2014 0530   BUN 38.6* 09/18/2014 0959   CREATININE 1.00 12/01/2014 0453   CREATININE  1.13 11/30/2014 0530   CREATININE 1.0 09/18/2014 0959   GLUCOSE 137* 12/01/2014 0453   GLUCOSE 172* 11/30/2014 0530   GLUCOSE 159* 09/18/2014 0959   CALCIUM 8.1* 12/01/2014 0453   CALCIUM 7.6* 11/30/2014 0530   CALCIUM 8.8 09/18/2014 0959   AST 14* 11/30/2014 0530   AST 25 11/27/2014 0408   AST 17 09/18/2014 0959   ALT 10* 11/30/2014 0530   ALT 17 11/27/2014 0408   ALT 12 09/18/2014 0959   ALKPHOS 40 11/30/2014 0530   ALKPHOS 51 11/27/2014 0408   ALKPHOS 65 09/18/2014 0959   BILITOT 0.3 11/30/2014 0530   BILITOT 0.4 11/27/2014 0408   BILITOT <0.20 09/18/2014 0959   PROT 4.4* 11/30/2014 0530   PROT 5.0* 11/27/2014 0408   PROT 5.3* 09/18/2014 0959   ALBUMIN 1.8* 11/30/2014 0530   ALBUMIN 2.3* 11/27/2014 0408   ALBUMIN 2.4* 09/18/2014 0959    Studies/Results: Ir Venipuncture 64yrs/older By Md  12/01/2014   CLINICAL DATA:  Anemia, difficult access, poor peripheral veins, access for blood transfusion  EXAM: ULTRASOUND GUIDANCE FOR VASCULAR ACCESS  PERIPHERAL IV START BY PHYSICIAN.  Date:  9/2/20169/05/2014 5:13 pm  Radiologist:  M. Daryll Brod, MD  Guidance:  Ultrasound  FLUOROSCOPY TIME:  None.  MEDICATIONS AND MEDICAL HISTORY: None.  ANESTHESIA/SEDATION:  None.  CONTRAST:  None.  COMPLICATIONS: None immediate  PROCEDURE: Informed consent was obtained from the patient following explanation of the procedure, risks, benefits and alternatives. The patient understands, agrees and consents for the procedure. All questions were addressed. A time out was performed.  Maximal barrier sterile technique utilized including caps, mask, sterile gowns, sterile gloves, large sterile drape, hand hygiene, and ChloraPrep  Preliminary ultrasound performed. Left basilic vein demonstrated. Under sterile conditions and local anesthesia, a 16 gauge Angiocath was advanced into the left basilic vein under ultrasound. Position confirmed with ultrasound. Images obtained for documentation. Blood aspirated easily followed  by saline flush. This was secured externally with a sterile dressing. Ready for use.  IMPRESSION: Successful ultrasound left upper extremity basilic vein peripheral IV start.   Electronically Signed   By: Darryl Mages.  Griffith DarrylD.   On: 12/01/2014 17:19   Ir US Guide Vasc Access Left  12/01/2014   CLINICAL DATA:  Anemia, difficult access, poor peripheral veins, access for blood transfusion  EXAM: ULTRASOUND GUIDANCE FOR VASCULAR ACCESS  PERIPHERAL IV START BY PHYSICIAN.  Date:  9/2/20169/05/2014 5:13 pm  Radiologist:  M. Daryll Brod, MD  Guidance:  Ultrasound  FLUOROSCOPY TIME:  None.  MEDICATIONS AND MEDICAL HISTORY: None.  ANESTHESIA/SEDATION: None.  CONTRAST:  None.  COMPLICATIONS: None immediate  PROCEDURE: Informed consent was obtained from the patient following explanation of the procedure, risks, benefits and alternatives. The patient understands, agrees and consents for the procedure. All questions were addressed. A time out was performed.  Maximal barrier sterile technique utilized including caps, mask, sterile gowns, sterile gloves, large sterile drape, hand hygiene, and ChloraPrep  Preliminary ultrasound performed. Left basilic vein demonstrated. Under sterile conditions and local anesthesia, a 16 gauge Angiocath was advanced into the left basilic vein under ultrasound. Position confirmed with ultrasound. Images obtained for documentation. Blood aspirated easily followed by saline flush. This was secured externally with a sterile dressing. Ready for use.  IMPRESSION: Successful ultrasound left upper extremity basilic vein peripheral IV start.   Electronically Signed   By: Darryl Mages.  Griffith DarrylD.   On: 12/01/2014 17:19    Anti-infectives: Anti-infectives    Start     Dose/Rate Route Frequency Ordered Stop   11/29/14 0830  [MAR Hold]  ceFAZolin (ANCEF) IVPB 2 g/50 mL premix     (MAR Hold since 11/29/14 0825)   2 g 100 mL/hr over 30 Minutes Intravenous On call to O.R. 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 On second day of 25% albumin q6 h to finish AM 9/3. Pt with single kidney, so would be generous with IVF.  Neuro - IV Tylenol and robaxin for pain control. Would avoid toradol given single kidney. Increase availability of rescue doses. Change to custom dose PCA.   ID-linezolid/flagyl for VRE. IR perc aspiration cx pending. Cx pending from OR.   PCM-prealbumin 12, TPN. Alb 1.8, getting some IV albumin.   Encourage OOB, ambulation.  Await resolution of post op ileus.  Earnstine Regal, MD, [facility] Office: [place] 12/02/2014

## 2014-12-03 LAB — CREATININE, SERUM
Creatinine, Ser: 1.21 mg/dL (ref 0.61–1.24)
GFR calc non Af Amer: 59 mL/min — ABNORMAL LOW (ref >60)

## 2014-12-03 LAB — CBC WITH DIFFERENTIAL/PLATELET
BASOS ABS: 0 10*3/uL (ref 0.0–0.1)
Basophils Relative: 1 % (ref 0–1)
EOS ABS: 0.1 10*3/uL (ref 0.0–0.7)
Eosinophils Relative: 7 % — ABNORMAL HIGH (ref 0–5)
HCT: 22.3 % — ABNORMAL LOW (ref 39.0–52.0)
Hemoglobin: 7.4 g/dL — ABNORMAL LOW (ref 13.0–17.0)
LYMPHS ABS: 0.2 10*3/uL — AB (ref 0.7–4.0)
LYMPHS PCT: 13 % (ref 12–46)
MCH: 27.3 pg (ref 26.0–34.0)
MCHC: 33.2 g/dL (ref 30.0–36.0)
MCV: 82.3 fL (ref 78.0–100.0)
Monocytes Absolute: 0.7 10*3/uL (ref 0.1–1.0)
Monocytes Relative: 42 % — ABNORMAL HIGH (ref 3–12)
NEUTROS ABS: 0.6 10*3/uL — AB (ref 1.7–7.7)
Neutrophils Relative %: 37 % — ABNORMAL LOW (ref 43–77)
PLATELETS: 314 10*3/uL (ref 150–400)
RBC: 2.71 MIL/uL — ABNORMAL LOW (ref 4.22–5.81)
RDW: 18.6 % — AB (ref 11.5–15.5)
WBC: 1.6 10*3/uL — AB (ref 4.0–10.5)

## 2014-12-03 LAB — TYPE AND SCREEN
ABO/RH(D): O NEG
Antibody Screen: NEGATIVE
UNIT DIVISION: 0
Unit division: 0

## 2014-12-03 LAB — GLUCOSE, CAPILLARY
GLUCOSE-CAPILLARY: 137 mg/dL — AB (ref 65–99)
Glucose-Capillary: 132 mg/dL — ABNORMAL HIGH (ref 65–99)
Glucose-Capillary: 138 mg/dL — ABNORMAL HIGH (ref 65–99)
Glucose-Capillary: 124 mg/dL — ABNORMAL HIGH (ref 65–99)

## 2014-12-03 LAB — CK: Total CK: 20 U/L — ABNORMAL LOW (ref 49–397)

## 2014-12-03 MED ORDER — FAT EMULSION 20 % IV EMUL
240.0000 mL | INTRAVENOUS | Status: AC
  Administered 2014-12-03: 240 mL via INTRAVENOUS
  Filled 2014-12-03: qty 250

## 2014-12-03 MED ORDER — KETOROLAC TROMETHAMINE 15 MG/ML IJ SOLN
15.0000 mg | Freq: Once | INTRAMUSCULAR | Status: AC
  Administered 2014-12-03: 15 mg via INTRAVENOUS
  Filled 2014-12-03: qty 1

## 2014-12-03 MED ORDER — TRACE MINERALS CR-CU-MN-SE-ZN 10-1000-500-60 MCG/ML IV SOLN
INTRAVENOUS | Status: AC
  Administered 2014-12-03: 18:00:00 via INTRAVENOUS
  Filled 2014-12-03: qty 2232

## 2014-12-03 MED ORDER — HYDRALAZINE HCL 20 MG/ML IJ SOLN: 5.0000 mg | Freq: Four times a day (QID) | INTRAMUSCULAR | Status: DC | PRN

## 2014-12-03 MED ORDER — SODIUM CHLORIDE 0.9 % IV SOLN
600.0000 mg | INTRAVENOUS | Status: DC
  Administered 2014-12-03 – 2014-12-13 (×11): 600 mg via INTRAVENOUS
  Filled 2014-12-03 (×13): qty 12

## 2014-12-03 NOTE — Progress Notes (Signed)
Changed PCA syringe -- wasted in sharps 1.5 mg.

## 2014-12-03 NOTE — Progress Notes (Signed)
PROGRESS NOTE  Darryl Griffith KAJ:[id number] DOB: 04/25/1945 DOA: 11/24/2014 PCP: Gennette Pac, MD  HPI: Darryl Griffith is a 69 y.o. male with a history of Left Flank Area Liposarcoma S/P Resection and Left Nephrectomy 06/2014 with Radiation Rx completed on 05/5595 complicated by abscess formation (+VRE), s/p drain placement for ~ 3 weeks (removed 8/2), was admitted on 8/26 with nausea/vomiting found to have SBO. General surgery was consulted on admission. Patient did not improve with conservative measures and is scheduled to undergo surgery today 8/31. He underwent incision and drainage of left back abscess, exploratory laparotomy, lysis of adhesions, small bowel resection and placement of drain into left retroperitoneal abscess.   Subjective / 24 H Interval events  Flat affect, reports pain , not happy about sitting in the chair. Low grade temp over the last 24 hours.   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 Gram stain negative.  - appreciate ID  recommendations.   HTN - prn hydralazine - BP stable  Hypokalemia - stable  Hypomagnesemia - improved with repletion  Anemia - monitor CBC, likely of chronic illness, and from anemia of blood loss.  - one unit of PRBC transfusion ordered, and repeat H&h is around 7..  - protonix added by surgery.  Repeat CBC in am shows hemoglobin of 7.4.   Hyponatremia: Post op , will need hydration and repeat in am show slight improvement. .    Neutropenia: suspision of myelosuppresion by zyvox. Change to cubicin after speaking with Dr Tommy Medal. Discussed with pharmacy.   TAchycardia: Worsened over the last 24 hours, probably from the pain and oob to chair. He is asymptomatic. EKG shows sinus tachycardia.    Diet: Diet NPO time specified Except for: Sips with Meds TPN (CLINIMIX-E) Adult TPN (CLINIMIX-E) Adult Fluids: NS DVT Prophylaxis: SCD  Code Status: Full Code Family Communication: family bedside  Disposition Plan: remain inpatient  Consultants:  General surgery   ID  Procedures:  incision and drainage of left back abscess, exploratory laparotomy, lysis of adhesions, small bowel resection and placement of drain into left retroperitoneal abscess.  Antibiotics  Anti-infectives    Start     Dose/Rate Route Frequency Ordered Stop   12/03/14 1300  DAPTOmycin (CUBICIN) 600 mg in sodium chloride 0.9 % IVPB     600 mg 224 mL/hr over 30 Minutes Intravenous Every 24 hours 12/03/14 1157     11/29/14 0830  [MAR Hold]  ceFAZolin (ANCEF) IVPB 2 g/50 mL premix     (MAR Hold since 11/29/14 0825)   2 g 100 mL/hr over 30 Minutes Intravenous On call to O.R. 11/28/14 4163 11/29/14 8453  11/25/14 0000  metroNIDAZOLE (FLAGYL) IVPB 500 mg     500 mg 100 mL/hr over 60 Minutes Intravenous Every 8 hours 11/24/14 2355     11/25/14 0000  linezolid (ZYVOX) IVPB 600 mg  Status:  Discontinued     600 mg 300 mL/hr over 60 Minutes Intravenous Every 12 hours 11/24/14 2355 12/03/14 1145       Studies Ir Venipuncture 47yrs/older By Md  12/01/2014   CLINICAL DATA:  Anemia, difficult access, poor peripheral veins, access for blood transfusion  EXAM: ULTRASOUND GUIDANCE FOR VASCULAR ACCESS  PERIPHERAL IV START BY PHYSICIAN.  Date:  9/2/20169/05/2014 5:13 pm  Radiologist:  M. Daryll Brod, MD  Guidance:  Ultrasound  FLUOROSCOPY TIME:  None.  MEDICATIONS AND MEDICAL HISTORY: None.  ANESTHESIA/SEDATION: None.  CONTRAST:  None.  COMPLICATIONS: None immediate  PROCEDURE: Informed consent was obtained from the patient following explanation of the procedure, risks, benefits and alternatives. The patient understands, agrees and consents for the procedure. All questions were addressed. A time out was performed.  Maximal barrier sterile technique utilized including caps, mask, sterile gowns, sterile gloves, large sterile drape, hand hygiene, and ChloraPrep  Preliminary ultrasound performed. Left basilic vein demonstrated. Under sterile conditions and local anesthesia, a 16 gauge Angiocath was advanced into the left basilic vein under ultrasound. Position confirmed with ultrasound. Images obtained for documentation. Blood aspirated easily followed by saline flush. This was secured externally with a sterile dressing. Ready for use.  IMPRESSION: Successful ultrasound left upper extremity basilic vein peripheral IV start.   Electronically Signed   By: [name] M.D.   On: 12/01/2014 17:19   Ir US Guide Vasc Access Left  12/01/2014   CLINICAL DATA:  Anemia, difficult access, poor peripheral veins, access for blood transfusion  EXAM: ULTRASOUND GUIDANCE FOR VASCULAR ACCESS  PERIPHERAL IV START BY PHYSICIAN.  Date:  9/2/20169/05/2014 5:13 pm  Radiologist:  M. Daryll Brod, MD  Guidance:  Ultrasound  FLUOROSCOPY TIME:  None.  MEDICATIONS AND MEDICAL HISTORY: None.  ANESTHESIA/SEDATION: None.  CONTRAST:  None.  COMPLICATIONS: None immediate  PROCEDURE: Informed consent was obtained from the patient following explanation  of the procedure, risks, benefits and alternatives. The patient understands, agrees and consents for the procedure. All questions were addressed. A time out was performed.  Maximal barrier sterile technique utilized including caps, mask, sterile gowns, sterile gloves, large sterile drape, hand hygiene, and ChloraPrep  Preliminary ultrasound performed. Left basilic vein demonstrated. Under sterile conditions and local anesthesia, a 16 gauge Angiocath was advanced into the left basilic vein under ultrasound. Position confirmed with ultrasound. Images obtained for documentation. Blood aspirated easily followed by saline flush. This was secured externally with a sterile dressing. Ready for use.  IMPRESSION: Successful ultrasound left upper extremity basilic vein peripheral IV start.   Electronically Signed   By: [name] M.D.   On: 12/01/2014 17:19   Objective  Filed Vitals:   12/03/14 1018 12/03/14 1109 12/03/14 1133 12/03/14 1141  BP: 176/69   128/76  Pulse: 123   126  Temp: 99.3 F (37.4 C)     TempSrc: Oral     Resp: 21 35 36   Height:      Weight:      SpO2: 95% 95% 96%     Intake/Output Summary (Last 24 hours) at 12/03/14 1427 Last data filed at 12/03/14 0800  Gross per 24 hour  Intake    360 ml  Output   1840 ml  Net  -1480 ml  Filed Weights   11/24/14 2020  Weight: 73.936 kg (163 lb)   Exam:  GENERAL: NAD, NG tube in place  LUNGS: diminished at bases  HEART: tachycardic,  without murmur. 2+ pulses, no JVD, no peripheral edema  ABDOMEN: Soft, nontender, and nondistended. No bowel sounds.   EXTREMITIES: Without any cyanosis, clubbing, rash, lesions or edema.  Data Reviewed: Basic Metabolic Panel:  Recent Labs Lab 11/27/14 0408 11/29/14 0440 11/30/14 0530 12/01/14 0453 12/03/14 1235  NA 134* 135 130* 131*  --   K 3.5 4.1 4.4 4.2  --   CL 104 100* 99* 97*  --   CO2 25 29 26 28   --   GLUCOSE 125* 127* 172* 137*  --   BUN 13 18 31* 29*  --   CREATININE 1.03  0.91 1.13 1.00 1.21  CALCIUM 8.0* 8.2* 7.6* 8.1*  --   MG 1.7  --  1.8  --   --   PHOS 2.1*  --  3.4  --   --    Liver Function Tests:  Recent Labs Lab 11/27/14 0408 11/30/14 0530  AST 25 14*  ALT 17 10*  ALKPHOS 51 40  BILITOT 0.4 0.3  PROT 5.0* 4.4*  ALBUMIN 2.3* 1.8*   No results for input(s): LIPASE, AMYLASE in the last 168 hours. CBC:  Recent Labs Lab 11/27/14 0408 11/29/14 0440 11/30/14 1030 12/01/14 0530 12/01/14 1550 12/02/14 1140 12/03/14 0528  WBC 4.0 3.5* 3.5* 2.9*  --  1.6* 1.6*  NEUTROABS 3.1  --   --   --   --  0.7* 0.6*  HGB 7.8* 8.5* 8.4* 6.4* 7.4* 7.0* 7.4*  HCT 24.7* 25.8* 25.4* 19.1* 21.9* 21.0* 22.3*  MCV 81.0 80.6 80.6 79.9  --  82.7 82.3  PLT 366 352 303 298  --  281 314   Scheduled Meds: . sodium chloride   Intravenous Once  . sodium chloride   Intravenous Once  . bisacodyl  10 mg Rectal Daily  . DAPTOmycin (CUBICIN)  IV  600 mg Intravenous Q24H  . heparin subcutaneous  5,000 Units Subcutaneous 3 times per day  . HYDROmorphone PCA 0.3 mg/mL   Intravenous 6 times per day  . insulin aspart  0-9 Units Subcutaneous 4 times per day  . lip balm  1 application Topical BID  . methocarbamol (ROBAXIN)  IV  500 mg Intravenous 3 times per day  . metronidazole  500 mg Intravenous Q8H  . pantoprazole (PROTONIX) IV  40 mg Intravenous Q12H   Continuous Infusions: . sodium chloride 10 mL/hr at 11/30/14 0426  . Marland KitchenTPN (CLINIMIX-E) Adult 93 mL/hr at 12/02/14 1747   And  . fat emulsion 240 mL (12/02/14 1747)  . Marland KitchenTPN (CLINIMIX-E) Adult     And  . fat emulsion      Hosie Poisson, MD [facility] Pager [phone]. If 7 PM - 7 AM, please contact night-coverage at [email or web address], password [facility] 12/03/2014, 2:27 PM  LOS: 9 days

## 2014-12-03 NOTE — Progress Notes (Signed)
Patient ID: Darryl Griffith, male   DOB: 10/30/1945, 69 y.o.   MRN: [id number]  [facility]  POD#: 4  Subjective: Patient in bed.  Has not been OOB since OR.  Refuses.  Using PCA sparingly.  Objective: Vital signs in last 24 hours: Temp:  [98.6 F (37 C)-100.5 F (38.1 C)] 99.6 F (37.6 C) (09/04 0621) Pulse Rate:  [120-124] 120 (09/04 0621) Resp:  [20-28] 20 (09/04 0621) BP: (151-162)/(70-85) 162/85 mmHg (09/04 0621) SpO2:  [92 %-98 %] 98 % (09/04 0621) FiO2 (%):  [0 %] 0 % (09/04 0500) Last BM Date: 11/28/14  Intake/Output from previous day: 09/03 0701 - 09/04 0700 In: 360  Out: 1940 [Urine:1300; Emesis/NG output:600; Drains:40] Intake/Output this shift:    Physical Exam: HEENT - sclerae clear, mucous membranes moist Neck - soft Chest - clear bilaterally Cor - RRR Abdomen - soft, wounds dry and intact; JP drain with serosanguinous; On-Q and dressings removed  Lab Results:   Recent Labs  12/02/14 1140 12/03/14 0528  WBC 1.6* 1.6*  HGB 7.0* 7.4*  HCT 21.0* 22.3*  PLT 281 314   BMET  Recent Labs  12/01/14 0453  NA 131*  K 4.2  CL 97*  CO2 28  GLUCOSE 137*  BUN 29*  CREATININE 1.00  CALCIUM 8.1*   PT/INR No results for input(s): LABPROT, INR in the last 72 hours. Comprehensive Metabolic Panel:    Component Value Date/Time   NA 131* 12/01/2014 0453   NA 130* 11/30/2014 0530   NA 131* 09/18/2014 0959   K 4.2 12/01/2014 0453   K 4.4 11/30/2014 0530   K 4.4 09/18/2014 0959   CL 97* 12/01/2014 0453   CL 99* 11/30/2014 0530   CO2 28 12/01/2014 0453   CO2 26 11/30/2014 0530   CO2 26 09/18/2014 0959   BUN 29* 12/01/2014 0453   BUN 31* 11/30/2014 0530   BUN 38.6* 09/18/2014 0959   CREATININE 1.00 12/01/2014 0453   CREATININE 1.13 11/30/2014 0530   CREATININE 1.0 09/18/2014 0959   GLUCOSE 137* 12/01/2014 0453   GLUCOSE 172* 11/30/2014 0530   GLUCOSE 159* 09/18/2014 0959   CALCIUM 8.1* 12/01/2014 0453   CALCIUM  7.6* 11/30/2014 0530   CALCIUM 8.8 09/18/2014 0959   AST 14* 11/30/2014 0530   AST 25 11/27/2014 0408   AST 17 09/18/2014 0959   ALT 10* 11/30/2014 0530   ALT 17 11/27/2014 0408   ALT 12 09/18/2014 0959   ALKPHOS 40 11/30/2014 0530   ALKPHOS 51 11/27/2014 0408   ALKPHOS 65 09/18/2014 0959   BILITOT 0.3 11/30/2014 0530   BILITOT 0.4 11/27/2014 0408   BILITOT <0.20 09/18/2014 0959   PROT 4.4* 11/30/2014 0530   PROT 5.0* 11/27/2014 0408   PROT 5.3* 09/18/2014 0959   ALBUMIN 1.8* 11/30/2014 0530   ALBUMIN 2.3* 11/27/2014 0408   ALBUMIN 2.4* 09/18/2014 0959    Studies/Results: Ir Venipuncture 58yrs/older By Md  12/01/2014   CLINICAL DATA:  Anemia, difficult access, poor peripheral veins, access for blood transfusion  EXAM: ULTRASOUND GUIDANCE FOR VASCULAR ACCESS  PERIPHERAL IV START BY PHYSICIAN.  Date:  9/2/20169/05/2014 5:13 pm  Radiologist:  M. Daryll Brod, MD  Guidance:  Ultrasound  FLUOROSCOPY TIME:  None.  MEDICATIONS AND MEDICAL HISTORY: None.  ANESTHESIA/SEDATION: None.  CONTRAST:  None.  COMPLICATIONS: None immediate  PROCEDURE: Informed consent was obtained from the patient following explanation of the procedure, risks, benefits and alternatives. The patient understands, agrees and consents  for the procedure. All questions were addressed. A time out was performed.  Maximal barrier sterile technique utilized including caps, mask, sterile gowns, sterile gloves, large sterile drape, hand hygiene, and ChloraPrep  Preliminary ultrasound performed. Left basilic vein demonstrated. Under sterile conditions and local anesthesia, a 16 gauge Angiocath was advanced into the left basilic vein under ultrasound. Position confirmed with ultrasound. Images obtained for documentation. Blood aspirated easily followed by saline flush. This was secured externally with a sterile dressing. Ready for use.  IMPRESSION: Successful ultrasound left upper extremity basilic vein peripheral IV start.   Electronically  Signed   By: [name] M.D.   On: 12/01/2014 17:19   Ir US Guide Vasc Access Left  12/01/2014   CLINICAL DATA:  Anemia, difficult access, poor peripheral veins, access for blood transfusion  EXAM: ULTRASOUND GUIDANCE FOR VASCULAR ACCESS  PERIPHERAL IV START BY PHYSICIAN.  Date:  9/2/20169/05/2014 5:13 pm  Radiologist:  M. Daryll Brod, MD  Guidance:  Ultrasound  FLUOROSCOPY TIME:  None.  MEDICATIONS AND MEDICAL HISTORY: None.  ANESTHESIA/SEDATION: None.  CONTRAST:  None.  COMPLICATIONS: None immediate  PROCEDURE: Informed consent was obtained from the patient following explanation of the procedure, risks, benefits and alternatives. The patient understands, agrees and consents for the procedure. All questions were addressed. A time out was performed.  Maximal barrier sterile technique utilized including caps, mask, sterile gowns, sterile gloves, large sterile drape, hand hygiene, and ChloraPrep  Preliminary ultrasound performed. Left basilic vein demonstrated. Under sterile conditions and local anesthesia, a 16 gauge Angiocath was advanced into the left basilic vein under ultrasound. Position confirmed with ultrasound. Images obtained for documentation. Blood aspirated easily followed by saline flush. This was secured externally with a sterile dressing. Ready for use.  IMPRESSION: Successful ultrasound left upper extremity basilic vein peripheral IV start.   Electronically Signed   By: [name] M.D.   On: 12/01/2014 17:19    Anti-infectives: Anti-infectives    Start     Dose/Rate Route Frequency Ordered Stop   11/29/14 0830  [MAR Hold]  ceFAZolin (ANCEF) IVPB 2 g/50 mL premix     (MAR Hold since 11/29/14 0825)   2 g 100 mL/hr over 30 Minutes Intravenous On call to O.R. 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 Office: [place] 12/03/2014

## 2014-12-03 NOTE — Progress Notes (Addendum)
ANTIBIOTIC CONSULT NOTE - INITIAL  Pharmacy Consult for Daptomycin Indication: VRE, Retroperitoneal Abscess  Allergies  Allergen Reactions  . Compazine [Prochlorperazine] Other (See Comments)    Made pt really lethargic, and sleepy    Patient Measurements: Height: 5\' 8"  (172.7 cm) Weight: 163 lb (73.936 kg) IBW/kg (Calculated) : 68.4  Vital Signs: Temp: 99.3 F (37.4 C) (09/04 1018) Temp Source: Oral (09/04 1018) BP: 128/76 mmHg (09/04 1141) Pulse Rate: 126 (09/04 1141) Intake/Output from previous day: 09/03 0701 - 09/04 0700 In: 360  Out: 1940 [Urine:1300; Emesis/NG output:600; Drains:40] Intake/Output from this shift: Total I/O In: -  Out: 200 [Urine:200]  Labs:  Recent Labs  12/01/14 0453  12/01/14 0530 12/01/14 1550 12/02/14 1140 12/03/14 0528  WBC  --   --  2.9*  --  1.6* 1.6*  HGB  --   < > 6.4* 7.4* 7.0* 7.4*  PLT  --   --  298  --  281 314  CREATININE 1.00  --   --   --   --   --   < > = values in this interval not displayed. Estimated Creatinine Clearance: 66.5 mL/min (by C-G formula based on Cr of 1). No results for input(s): VANCOTROUGH, VANCOPEAK, VANCORANDOM, GENTTROUGH, GENTPEAK, GENTRANDOM, TOBRATROUGH, TOBRAPEAK, TOBRARND, AMIKACINPEAK, AMIKACINTROU, AMIKACIN in the last 72 hours.   Microbiology: Recent Results (from the past 720 hour(s))  Culture, routine-abscess     Status: None   Collection Time: 11/10/14  3:15 PM  Result Value Ref Range Status   Specimen Description ABSCESS LEFT RETROPERITONEAL FLUID  Final   Special Requests Normal  Final   Gram Stain   Final    NO WBC SEEN NO SQUAMOUS EPITHELIAL CELLS SEEN NO ORGANISMS SEEN Performed at [facility]    Culture   Final    RARE VANCOMYCIN RESISTANT ENTEROCOCCUS ISOLATED Note: CRITICAL RESULT CALLED TO, READ BACK BY AND VERIFIED WITH: Clarise Cruz RN @ [phone] ON [id number] BY [facility] REPORT FAXED BY REQUEST Performed at [facility]    Report Status 11/15/2014 FINAL  Final   Organism ID, Bacteria VANCOMYCIN RESISTANT ENTEROCOCCUS ISOLATED  Final      Susceptibility   Vancomycin resistant enterococcus isolated - MIC*    VANCOMYCIN 256 RESISTANT Resistant     AMPICILLIN >=32 RESISTANT Resistant     LINEZOLID 2 SENSITIVE Sensitive     * RARE VANCOMYCIN RESISTANT ENTEROCOCCUS ISOLATED  Anaerobic culture     Status: None   Collection Time: 11/10/14  3:15 PM  Result Value Ref Range Status   Specimen Description ABSCESS LEFT RETROPERITONEAL FLUID  Final   Special Requests Normal  Final   Gram Stain   Final    NO WBC SEEN NO SQUAMOUS EPITHELIAL CELLS SEEN NO ORGANISMS SEEN Performed at [facility]    Culture   Final    NO ANAEROBES ISOLATED Performed at [facility]    Report Status 11/16/2014 FINAL  Final  Fungus culture w smear     Status: None (Preliminary result)   Collection Time: 11/10/14  3:15 PM  Result Value Ref Range Status   Specimen Description ABSCESS LEFT RETROPERITONEAL FLUID  Final   Special Requests Normal  Final   Fungal Smear   Final    NO YEAST OR FUNGAL ELEMENTS SEEN Performed at [facility]    Culture   Final    CULTURE IN PROGRESS FOR FOUR WEEKS Performed at [facility]    Report Status PENDING  Incomplete  Culture, routine-abscess     Status: None   Collection Time: 11/27/14 12:22 PM  Result Value Ref Range Status   Specimen Description   Final    ABSCESS ASPIRATION OF RECURRENT LEFT RETROPERITONEAL FLUID. Nephrectomy   Special Requests Normal  Final   Gram Stain   Final    ABUNDANT WBC PRESENT,BOTH PMN AND MONONUCLEAR NO SQUAMOUS EPITHELIAL CELLS SEEN NO ORGANISMS SEEN Performed at [facility]    Culture   Final    NO GROWTH 3 DAYS Performed at [facility]    Report Status 11/30/2014 FINAL  Final  Anaerobic culture     Status: None   Collection Time: 11/27/14 12:22 PM  Result Value Ref Range Status   Specimen Description ABSCESS LEFT RETROPERITONEAL FLUID.  Nephrectomy   Final   Special Requests Normal  Final   Gram Stain   Final    ABUNDANT WBC PRESENT,BOTH PMN AND MONONUCLEAR NO SQUAMOUS EPITHELIAL CELLS SEEN NO ORGANISMS SEEN Performed at [facility]    Culture   Final    NO ANAEROBES ISOLATED Performed at [facility]    Report Status 12/02/2014 FINAL  Final  Fungus culture w smear     Status: None (Preliminary result)   Collection Time: 11/27/14 12:22 PM  Result Value Ref Range Status   Specimen Description ABSCESS LEFT RETROPERITONEAL FLUID/Nephrectomy  Final   Special Requests Normal  Final   Fungal Smear   Final    NO YEAST OR FUNGAL ELEMENTS SEEN Performed at [facility]    Culture   Final    CULTURE IN PROGRESS FOR FOUR WEEKS Performed at [facility]    Report Status PENDING  Incomplete  Surgical pcr screen     Status: None   Collection Time: 11/29/14  6:02 AM  Result Value Ref Range Status   MRSA, PCR NEGATIVE NEGATIVE Final   Staphylococcus aureus NEGATIVE NEGATIVE Final    Comment:        The Xpert SA Assay (FDA approved for NASAL specimens in patients over 28 years of age), is one component of a comprehensive surveillance program.  Test performance has been validated by [facility] for patients greater than or equal to 101 year old. It is not intended to diagnose infection nor to guide or monitor treatment.   Culture, routine-abscess     Status: None   Collection Time: 11/29/14 10:23 AM  Result Value Ref Range Status   Specimen Description ABSCESS BACK  Final   Special Requests NONE  Final   Gram Stain   Final    RARE WBC PRESENT, PREDOMINANTLY PMN NO SQUAMOUS EPITHELIAL CELLS SEEN NO ORGANISMS SEEN Performed at [facility]    Culture   Final    NO GROWTH 3 DAYS Performed at [facility]    Report Status 12/02/2014 FINAL  Final  Anaerobic culture     Status: None (Preliminary result)   Collection Time: 11/29/14 10:25 AM  Result Value Ref Range  Status   Specimen Description ABSCESS BACK  Final   Special Requests NONE  Final   Gram Stain   Final    RARE WBC PRESENT, PREDOMINANTLY PMN NO SQUAMOUS EPITHELIAL CELLS SEEN NO ORGANISMS SEEN Performed at [facility]    Culture   Final    NO ANAEROBES ISOLATED; CULTURE IN PROGRESS FOR 5 DAYS Performed at [facility]    Report Status PENDING  Incomplete  Body fluid culture  Status: None   Collection Time: 11/29/14 11:42 AM  Result Value Ref Range Status   Specimen Description FLUID PERITONEAL LEFT PERITONEAL FLUID  Final   Special Requests NONE  Final   Gram Stain   Final    FEW WBC PRESENT, PREDOMINANTLY PMN NO ORGANISMS SEEN    Culture   Final    NO GROWTH 3 DAYS Performed at [facility]    Report Status 12/02/2014 FINAL  Final  Anaerobic culture     Status: None (Preliminary result)   Collection Time: 11/29/14 11:42 AM  Result Value Ref Range Status   Specimen Description FLUID PERITONEAL  Final   Special Requests NONE  Final   Gram Stain   Final    FEW WBC PRESENT, PREDOMINANTLY PMN NO ORGANISMS SEEN    Culture   Final    NO ANAEROBES ISOLATED; CULTURE IN PROGRESS FOR 5 DAYS Performed at [facility]    Report Status PENDING  Incomplete    Medical History: Past Medical History  Diagnosis Date  . Hypertension   . Anginal pain     hx of CP saw Dr Wynonia Lawman 09/20/2013 H&P per chart   . Left Retroperitoneal mass s/p resection/L nephrectomy 07/13/2014   . Postoperative anemia due to acute blood loss 07/15/2014  . SBO (small bowel obstruction) 08/01/2014, 10/11/14  . Retroperitoneal fluid collection   . 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 Impaired fasting glucose 11/25/2014  . Benign neoplasm of colon 11/25/2014  . Actinic keratosis 11/25/2014  . 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 Patient underwent repeat aspiration of fluid on 8/29. Patient has been developing progressive leukopenia and also has anemia. Per ID recommendations, changing Linezolid to Daptomycin.    PTA Linezolid, Metronidazole since 11/15/14 Inpatient: 8/27 >> Linezolid >> 9/4 8/27 >> Flagyl >> 9/4 >> Daptomycin >>    8/12 L retroperitoneal fluid abscess: VRE (S = Linezolid) 8/29 abd fluid abscess: NGF 8/29 anaerobic abd fluid abscess: NGF 8/29 fungus abd fluid abscess: no yeast or fungal elements seen, cx in progress x 4 wks 8/31 peritoneal fluid: NGF 8/31 anaerobic peritoneal fluid: NGTD, cx in progress x 5 days 8/31 back abscess: NGF 8/31 anaerobic back abscess: NGTD, cx in progress x 5 days 8/31 MRSA PCR: negative  Tmax: 100.15F SCr 1.21 with CrCl ~ 55 ml/min CG WBC low at 1.6  Goal of Therapy:  Appropriate antibiotic dosing for renal function and indication Eradication of infection  Plan:   Per discussion with Dr. Tommy Medal with ID, start Daptomycin 8 mg/kg IV q24h.  Monitor weekly CK levels while on Daptomycin therapy.   Continue Flagyl 500 mg IV q8h per ID recs.  Monitor renal function, cultures, clinical course.    Lindell Spar, PharmD, BCPS Pager: [phone] 12/03/2014 12:22 PM

## 2014-12-03 NOTE — Progress Notes (Signed)
PARENTERAL NUTRITION CONSULT NOTE - Follow-Up  Pharmacy Consult for TPN Indication: SBO  Allergies  Allergen Reactions  . Compazine [Prochlorperazine] Other (See Comments)    Made pt really lethargic, and sleepy   Patient Measurements: Height: _0  (172.7 cm) Weight: 163 lb (73.936 kg) IBW/kg (Calculated) : 68.4  Vital Signs: Temp: 99.3 F (37.4 C) (09/04 1018) Temp Source: Oral (09/04 1018) BP: 128/76 mmHg (09/04 1141) Pulse Rate: 126 (09/04 1141) Intake/Output from previous day: 09/03 0701 - 09/04 0700 In: 360  Out: 1940 [Urine:1300; Emesis/NG output:600; Drains:40] Intake/Output from this shift: Total I/O In: -  Out: 200 [Urine:200]  Labs:  Recent Labs  12/01/14 0530 12/01/14 1550 12/02/14 1140 12/03/14 0528  WBC 2.9*  --  1.6* 1.6*  HGB 6.4* 7.4* 7.0* 7.4*  HCT 19.1* 21.9* 21.0* 22.3*  PLT 298  --  281 314    Recent Labs  12/01/14 0453  NA 131*  K 4.2  CL 97*  CO2 28  GLUCOSE 137*  BUN 29*  CREATININE 1.00  CALCIUM 8.1*   Estimated Creatinine Clearance: 66.5 mL/min (by C-G formula based on Cr of 1).    Recent Labs  12/02/14 2348 12/03/14 0620 12/03/14 1200  GLUCAP 137* 132* 138*    Medical History: Past Medical History  Diagnosis Date  . Hypertension   . Anginal pain     hx of CP saw Dr Wynonia Lawman 09/20/2013 H&P per chart   . Left Retroperitoneal mass s/p resection/L nephrectomy 07/13/2014   . Postoperative anemia due to acute blood loss 07/15/2014  . SBO (small bowel obstruction) 08/01/2014, 10/11/14  . Retroperitoneal fluid collection   . 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 Impaired fasting glucose 11/25/2014  . Benign neoplasm of colon 11/25/2014  . Actinic keratosis 11/25/2014  . Mixed hyperlipidemia 11/25/2014    Insulin Requirements: 5 units over the past 24 hours (on sensitive SSI q6h) - no hx DM  Current Nutrition: NPO - NG tube   IVF: NS at  [facility] access: PICC placed 10/12/14 TPN start date: 8/28  ASSESSMENT                                                                                                          HPI:  [name] s/p resection L retroperitoneal sarcoma, L nephrectomy 07/13/14 developed VRE abscess. Hx abd bloating, N/V 2 days PTA, SBO seen on CT for Ca restaging.  Hx recurrent SBO. Begin TPN per Pharmacy  Significant events:   8/29: plan for exp laparotomy on 8/31; s/p aspiration of L retroperitoneal fluid collection on 8/29 8/31:  s/p exp lap -->  I&D L back abscess, lysis of adhesions, small bowel resection, placement og drain into L retroperitoneal abscess 9/2: bloody NGT output, PPI IV BID added;  change clinimix E 5/15 to E 5/20  Today:   Glucose (goal <150): all readings < 150   Electrolytes - no new labs today; Na low at 131;  K, Corected Ca WNL (  9/2)  Renal - WNL (9/2)  LFTs - AST/ALT low, Alk Phos, Tbili WNL (9/1)  TGs - 132 (8/29)  Prealbumin - 12.1 (8/29)  NUTRITIONAL GOALS                                                                                             RD recs:   Kcal: 2300-2500  Protein: 110-120 grams  Fluid: 2.2-2.5 L/day  Clinimix E 5/20 at a goal rate of 93 ml/hr + 20% fat emulsion at 10 ml/hr to provide: 112 g/day protein, 2444 Kcal/day (per dietician recommendations)   PLAN                                                                                                                         At 1800 today:  Continue Clinimix E 5/20 at goal rate of 93 ml/hr.  20% fat emulsion at 10 ml/hr.  TPN to contain standard multivitamins and trace elements.  Continue IVF at [facility].  Continue sensitive SSI q6h.  TPN lab panels on Mondays & Thursdays.  F/u daily.   Lindell Spar, PharmD, BCPS Pager: [phone] 12/03/2014 12:28 PM

## 2014-12-04 ENCOUNTER — Inpatient Hospital Stay (HOSPITAL_COMMUNITY): Payer: Medicare Other

## 2014-12-04 DIAGNOSIS — R000 Tachycardia, unspecified: Secondary | ICD-10-CM

## 2014-12-04 DIAGNOSIS — I10 Essential (primary) hypertension: Secondary | ICD-10-CM

## 2014-12-04 LAB — DIFFERENTIAL
BASOS ABS: 0 10*3/uL (ref 0.0–0.1)
Basophils Relative: 1 % (ref 0–1)
EOS ABS: 0.2 10*3/uL (ref 0.0–0.7)
Eosinophils Relative: 14 % — ABNORMAL HIGH (ref 0–5)
Lymphocytes Relative: 24 % (ref 12–46)
Lymphs Abs: 0.3 10*3/uL — ABNORMAL LOW (ref 0.7–4.0)
MONOS PCT: 38 % — AB (ref 3–12)
Monocytes Absolute: 0.4 10*3/uL (ref 0.1–1.0)
NEUTROS PCT: 23 % — AB (ref 43–77)
Neutro Abs: 0.3 10*3/uL — ABNORMAL LOW (ref 1.7–7.7)

## 2014-12-04 LAB — COMPREHENSIVE METABOLIC PANEL
ALT: 8 U/L — ABNORMAL LOW (ref 17–63)
ANION GAP: 6 (ref 5–15)
AST: 16 U/L (ref 15–41)
Albumin: 2.4 g/dL — ABNORMAL LOW (ref 3.5–5.0)
Alkaline Phosphatase: 53 U/L (ref 38–126)
BUN: 37 mg/dL — ABNORMAL HIGH (ref 6–20)
CHLORIDE: 100 mmol/L — AB (ref 101–111)
CO2: 24 mmol/L (ref 22–32)
CREATININE: 1.13 mg/dL (ref 0.61–1.24)
Calcium: 8.2 mg/dL — ABNORMAL LOW (ref 8.9–10.3)
Glucose, Bld: 130 mg/dL — ABNORMAL HIGH (ref 65–99)
POTASSIUM: 4 mmol/L (ref 3.5–5.1)
SODIUM: 130 mmol/L — AB (ref 135–145)
Total Bilirubin: 0.6 mg/dL (ref 0.3–1.2)
Total Protein: 5.1 g/dL — ABNORMAL LOW (ref 6.5–8.1)

## 2014-12-04 LAB — CBC
HCT: 21.6 % — ABNORMAL LOW (ref 39.0–52.0)
HEMOGLOBIN: 7.2 g/dL — AB (ref 13.0–17.0)
MCH: 27.5 pg (ref 26.0–34.0)
MCHC: 33.3 g/dL (ref 30.0–36.0)
MCV: 82.4 fL (ref 78.0–100.0)
Platelets: 316 10*3/uL (ref 150–400)
RBC: 2.62 MIL/uL — ABNORMAL LOW (ref 4.22–5.81)
RDW: 19.3 % — ABNORMAL HIGH (ref 11.5–15.5)
WBC: 1.2 10*3/uL — AB (ref 4.0–10.5)

## 2014-12-04 LAB — ANAEROBIC CULTURE

## 2014-12-04 LAB — PHOSPHORUS: PHOSPHORUS: 4 mg/dL (ref 2.5–4.6)

## 2014-12-04 LAB — GLUCOSE, CAPILLARY
GLUCOSE-CAPILLARY: 141 mg/dL — AB (ref 65–99)
GLUCOSE-CAPILLARY: 145 mg/dL — AB (ref 65–99)
GLUCOSE-CAPILLARY: 145 mg/dL — AB (ref 65–99)
Glucose-Capillary: 117 mg/dL — ABNORMAL HIGH (ref 65–99)

## 2014-12-04 LAB — PREALBUMIN: Prealbumin: 6.1 mg/dL — ABNORMAL LOW (ref 18–38)

## 2014-12-04 LAB — MAGNESIUM: MAGNESIUM: 2.1 mg/dL (ref 1.7–2.4)

## 2014-12-04 LAB — TRIGLYCERIDES: Triglycerides: 91 mg/dL (ref <150)

## 2014-12-04 MED ORDER — TRACE MINERALS CR-CU-MN-SE-ZN 10-1000-500-60 MCG/ML IV SOLN
INTRAVENOUS | Status: AC
  Administered 2014-12-04: 18:00:00 via INTRAVENOUS
  Filled 2014-12-04: qty 2232

## 2014-12-04 MED ORDER — FAT EMULSION 20 % IV EMUL
240.0000 mL | INTRAVENOUS | Status: AC
  Administered 2014-12-04: 240 mL via INTRAVENOUS
  Filled 2014-12-04: qty 250

## 2014-12-04 NOTE — Progress Notes (Signed)
PARENTERAL NUTRITION CONSULT NOTE - Follow-Up  Pharmacy Consult for TPN Indication: SBO  Allergies  Allergen Reactions  . Compazine [Prochlorperazine] Other (See Comments)    Made pt really lethargic, and sleepy   Patient Measurements: Height: 5' 8"  (172.7 cm) Weight: 163 lb (73.936 kg) IBW/kg (Calculated) : 68.4  Vital Signs: Temp: 98.3 F (36.8 C) (09/05 0200) Temp Source: Oral (09/05 0200) BP: 139/75 mmHg (09/05 0200) Pulse Rate: 106 (09/05 0200) Intake/Output from previous day: 09/04 0701 - 09/05 0700 In: 1716 [I.V.:120; TPN:1236] Out: 1740 [Urine:1700; Drains:40] Intake/Output from this shift:    Labs:  Recent Labs  12/02/14 1140 12/03/14 0528 12/04/14 0600  WBC 1.6* 1.6* 1.2*  HGB 7.0* 7.4* 7.2*  HCT 21.0* 22.3* 21.6*  PLT 281 314 316    Recent Labs  12/03/14 1235 12/04/14 0600  NA  --  130*  K  --  4.0  CL  --  100*  CO2  --  24  GLUCOSE  --  130*  BUN  --  37*  CREATININE 1.21 1.13  CALCIUM  --  8.2*  MG  --  2.1  PHOS  --  4.0  PROT  --  5.1*  ALBUMIN  --  2.4*  AST  --  16  ALT  --  8*  ALKPHOS  --  53  BILITOT  --  0.6   Estimated Creatinine Clearance: 58.8 mL/min (by C-G formula based on Cr of 1.13).    Recent Labs  12/03/14 1759 12/04/14 0002 12/04/14 0618  GLUCAP 124* 145* 141*    Medical History: Past Medical History  Diagnosis Date  . Hypertension   . Anginal pain     hx of CP saw Dr Wynonia Lawman 09/20/2013 H&P per chart   . Left Retroperitoneal mass s/p resection/L nephrectomy 07/13/2014   . Postoperative anemia due to acute blood loss 07/15/2014  . SBO (small bowel obstruction) 08/01/2014, 10/11/14  . Retroperitoneal fluid collection   . 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 Impaired fasting glucose 11/25/2014  . Benign neoplasm of colon 11/25/2014  . Actinic keratosis 11/25/2014  . Mixed hyperlipidemia 11/25/2014    Insulin Requirements: 5  units over the past 24 hours (on sensitive SSI q6h) - no hx DM  Current Nutrition: NPO - NG tube   IVF: NS at [facility] access: PICC placed 10/12/14 TPN start date: 8/28  ASSESSMENT                                                                                                          HPI:  89 yoM s/p resection L retroperitoneal sarcoma, L nephrectomy 07/13/14 developed VRE abscess. Hx abd bloating, N/V 2 days PTA, SBO seen on CT for Ca restaging.  Hx recurrent SBO. Begin TPN per Pharmacy  Significant events:   8/29: plan for exp laparotomy on 8/31; s/p aspiration of L retroperitoneal fluid collection on 8/29 8/31:  s/p exp lap -->  I&D L back abscess, lysis of  adhesions, small bowel resection, placement og drain into L retroperitoneal abscess 9/2: bloody NGT output, PPI IV BID added;  change clinimix E 5/15 to E 5/20  Today:   Glucose (goal <150): all readings < 150   Electrolytes - Na low at 130;  K, mag, phos WNL  Renal - WNL   LFTs - AST/ALT low, Alk Phos, Tbili WNL  TGs - 132 (8/29)  Prealbumin - 12.1 (8/29)  NUTRITIONAL GOALS                                                                                             RD recs:   Kcal: 2300-2500  Protein: 110-120 grams  Fluid: 2.2-2.5 L/day  Clinimix E 5/20 at a goal rate of 93 ml/hr + 20% fat emulsion at 10 ml/hr to provide: 112 g/day protein, 2444 Kcal/day (per dietician recommendations)   PLAN                                                                                                                         At 1800 today:  Continue Clinimix E 5/20 at goal rate of 93 ml/hr.  20% fat emulsion at 10 ml/hr.  TPN to contain standard multivitamins and trace elements.  Continue IVF at [facility].  Continue sensitive SSI q6h.  TPN lab panels on Mondays & Thursdays.  F/u daily.   Darryl Griffith, PharmD, BCPS 12/04/2014 11:02 AM

## 2014-12-04 NOTE — Progress Notes (Signed)
  Echocardiogram 2D Echocardiogram has been performed.  Darryl Griffith 12/04/2014, 9:48 AM

## 2014-12-04 NOTE — Progress Notes (Signed)
Patient ID: Darryl Griffith, male   DOB: [birth year], 69 y.o.   MRN: [id number] 5 Days Post-Op  Subjective: No major C/O  Objective: Vital signs in last 24 hours: Temp:  [98.3 F (36.8 C)-101.8 F (38.8 C)] 98.5 F (36.9 C) (09/05 0626) Pulse Rate:  [68-119] 113 (09/05 0626) Resp:  [22-28] 24 (09/05 0641) BP: (139-168)/(69-81) 168/81 mmHg (09/05 0626) SpO2:  [96 %-100 %] 100 % (09/05 0641) FiO2 (%):  [0 %] 0 % (09/05 0641) Last BM Date: 11/28/14  Intake/Output from previous day: 09/04 0701 - 09/05 0700 In: 1716 [I.V.:120; TPN:1236] Out: 2140 [Urine:1700; Emesis/NG output:400; Drains:40] Intake/Output this shift:    General appearance: alert, cooperative and fatigued GI: normal findings: soft, non-tender Neurologic: Flat affect Incision/Wound: Dressing clean and dry.  Paking removed from back, wound clean  Lab Results:   Recent Labs  12/03/14 0528 12/04/14 0600  WBC 1.6* 1.2*  HGB 7.4* 7.2*  HCT 22.3* 21.6*  PLT 314 316   BMET  Recent Labs  12/03/14 1235 12/04/14 0600  NA  --  130*  K  --  4.0  CL  --  100*  CO2  --  24  GLUCOSE  --  130*  BUN  --  37*  CREATININE 1.21 1.13  CALCIUM  --  8.2*     Studies/Results: No results found.  Anti-infectives: Anti-infectives    Start     Dose/Rate Route Frequency Ordered Stop   12/03/14 1300  DAPTOmycin (CUBICIN) 600 mg in sodium chloride 0.9 % IVPB     600 mg 224 mL/hr over 30 Minutes Intravenous Every 24 hours 12/03/14 1157     11/29/14 0830  [MAR Hold]  ceFAZolin (ANCEF) IVPB 2 g/50 mL premix     (MAR Hold since 11/29/14 0825)   2 g 100 mL/hr over 30 Minutes Intravenous On call to O.R. 11/28/14 0850 11/29/14 0908   11/25/14 0000  metroNIDAZOLE (FLAGYL) IVPB 500 mg     500 mg 100 mL/hr over 60 Minutes Intravenous Every 8 hours 11/24/14 2355     11/25/14 0000  linezolid (ZYVOX) IVPB 600 mg  Status:  Discontinued     600 mg 300 mL/hr over 60 Minutes Intravenous Every 12 hours 11/24/14 2355 12/03/14 1145       Assessment/Plan: left retroperitoneal liposarcoma s/p resection and left nephrectomy 07/13/14 s/p XRT Post op intra-abdominal abscesses(VRE) Recurrent SBO  Status post ex lap/lysis of adhesions, small bowel resection, drainage back abscess - 11/29/2014 Stable Expected ileus, on TNA    LOS: 10 days    Lavaun Greenfield T 12/04/2014

## 2014-12-04 NOTE — Progress Notes (Signed)
Alerted MD to critical WBC of 1.2 Will await instruction.

## 2014-12-04 NOTE — Progress Notes (Signed)
PROGRESS NOTE  Chidi Shirer OMV:[id number] DOB: Jun 15, 1945 DOA: 11/24/2014 PCP: Gennette Pac, MD  HPI: Montford Barg is a 69 y.o. 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 General surgery was consulted on admission. Patient did not improve with conservative measures and is scheduled to undergo surgery today 8/31. He underwent incision and drainage of left back abscess, exploratory laparotomy, lysis of adhesions, small bowel resection and placement of drain into left retroperitoneal abscess.   Subjective / 24 H Interval events Has not used PCA much overnight, plan to change to IV dilaudid prn and stop the PCA in am.  No new complaitns. Has not beed out to bed today yet.   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 Gram  stain negative.  - appreciate ID recommendations.   HTN - prn hydralazine - BP stable  Hypokalemia - stable  Hypomagnesemia - improved with repletion  Anemia - monitor CBC, likely of chronic illness, and from anemia of blood loss.  - one unit of PRBC transfusion ordered, and repeat H&h is around 7..  - protonix added by surgery.  Repeat CBC in am shows hemoglobin of 7.2.   Hyponatremia: Post op , will need hydration and repeat in am show slight improvement. .    Neutropenia: suspision of myelosuppresion by zyvox. Changed to cubicin after speaking with Dr Tommy Medal. Discussed with pharmacy.   TAchycardia: Worsened over the last 24 hours, probably from the pain and oob to chair. He is asymptomatic. EKG shows sinus tachycardia.  Echo unremarkable.    Diet: Diet NPO time specified Except for: Sips with Meds TPN (CLINIMIX-E) Adult TPN (CLINIMIX-E) Adult Fluids: NS DVT Prophylaxis: SCD  Code Status: Full Code Family Communication: none at  bedside  Disposition Plan: remain inpatient, ordered PT eval.   Consultants:  General surgery   ID  Procedures:  incision and drainage of left back abscess, exploratory laparotomy, lysis of adhesions, small bowel resection and placement of drain into left retroperitoneal abscess.  Antibiotics  Anti-infectives    Start     Dose/Rate Route Frequency Ordered Stop   12/03/14 1300  DAPTOmycin (CUBICIN) 600 mg in sodium chloride 0.9 % IVPB     600 mg 224 mL/hr over 30 Minutes Intravenous Every 24 hours 12/03/14 1157     11/29/14 0830  [MAR Hold]  ceFAZolin (ANCEF) IVPB 2 g/50 mL premix     (MAR Hold since 11/29/14 0825)  2 g 100 mL/hr over 30 Minutes Intravenous On call to O.R. 11/28/14 0850 11/29/14 0908   11/25/14 0000  metroNIDAZOLE (FLAGYL) IVPB 500 mg     500 mg 100 mL/hr over 60 Minutes Intravenous Every 8 hours 11/24/14 2355     11/25/14 0000  linezolid (ZYVOX) IVPB 600 mg  Status:  Discontinued     600 mg 300 mL/hr over  60 Minutes Intravenous Every 12 hours 11/24/14 2355 12/03/14 1145      Studies No results found. Objective  Filed Vitals:   12/04/14 0626 12/04/14 0641 12/04/14 1200 12/04/14 1401  BP: 168/81   137/72  Pulse: 113   116  Temp: 98.5 F (36.9 C)   99.3 F (37.4 C)  TempSrc: Oral   Oral  Resp: 24 24 22 25   Height:      Weight:      SpO2: 98% 100% 99% 98%    Intake/Output Summary (Last 24 hours) at 12/04/14 1456 Last data filed at 12/04/14 1426  Gross per 24 hour  Intake   1716 ml  Output   2370 ml  Net   -654 ml   Filed Weights   11/24/14 2020  Weight: 73.936 kg (163 lb)   Exam:  GENERAL: NAD, NG tube in place  LUNGS: diminished at bases  HEART: tachycardic,  without murmur. 2+ pulses, no JVD, no peripheral edema  ABDOMEN: Soft, nontender, and nondistended. No bowel sounds.   EXTREMITIES: Without any cyanosis, clubbing, rash, lesions or edema.  Data Reviewed: Basic Metabolic Panel:  Recent Labs Lab 11/29/14 0440 11/30/14 0530 12/01/14 0453 12/03/14 1235 12/04/14 0600  NA 135 130* 131*  --  130*  K 4.1 4.4 4.2  --  4.0  CL 100* 99* 97*  --  100*  CO2 29 26 28   --  24  GLUCOSE 127* 172* 137*  --  130*  BUN 18 31* 29*  --  37*  CREATININE 0.91 1.13 1.00 1.21 1.13  CALCIUM 8.2* 7.6* 8.1*  --  8.2*  MG  --  1.8  --   --  2.1  PHOS  --  3.4  --   --  4.0   Liver Function Tests:  Recent Labs Lab 11/30/14 0530 12/04/14 0600  AST 14* 16  ALT 10* 8*  ALKPHOS 40 53  BILITOT 0.3 0.6  PROT 4.4* 5.1*  ALBUMIN 1.8* 2.4*   No results for input(s): LIPASE, AMYLASE in the last 168 hours. CBC:  Recent Labs Lab 11/30/14 1030 12/01/14 0530 12/01/14 1550 12/02/14 1140 12/03/14 0528 12/04/14 0600  WBC 3.5* 2.9*  --  1.6* 1.6* 1.2*  NEUTROABS  --   --   --  0.7* 0.6* 0.3*  HGB 8.4* 6.4* 7.4* 7.0* 7.4* 7.2*  HCT 25.4* 19.1* 21.9* 21.0* 22.3* 21.6*  MCV 80.6 79.9  --  82.7 82.3 82.4  PLT 303 298  --  281 314 316   Scheduled Meds: . sodium chloride    Intravenous Once  . sodium chloride   Intravenous Once  . bisacodyl  10 mg Rectal Daily  . DAPTOmycin (CUBICIN)  IV  600 mg Intravenous Q24H  . heparin subcutaneous  5,000 Units Subcutaneous 3 times per day  . HYDROmorphone PCA 0.3 mg/mL   Intravenous 6 times per day  . insulin aspart  0-9 Units Subcutaneous 4 times per day  . lip balm  1 application Topical BID  . methocarbamol (ROBAXIN)  IV  500 mg Intravenous 3 times per day  .  metronidazole  500 mg Intravenous Q8H  . pantoprazole (PROTONIX) IV  40 mg Intravenous Q12H   Continuous Infusions: . sodium chloride 10 mL/hr at 11/30/14 0426  . Marland KitchenTPN (CLINIMIX-E) Adult 93 mL/hr at 12/03/14 1745   And  . fat emulsion 240 mL (12/03/14 1745)  . Marland KitchenTPN (CLINIMIX-E) Adult     And  . fat emulsion      Hosie Poisson, MD [facility] Pager [phone]. If 7 PM - 7 AM, please contact night-coverage at [email or web address], password [facility] 12/04/2014, 2:56 PM  LOS: 10 days

## 2014-12-05 DIAGNOSIS — D72819 Decreased white blood cell count, unspecified: Secondary | ICD-10-CM

## 2014-12-05 DIAGNOSIS — K651 Peritoneal abscess: Secondary | ICD-10-CM

## 2014-12-05 DIAGNOSIS — D649 Anemia, unspecified: Secondary | ICD-10-CM

## 2014-12-05 LAB — CBC WITH DIFFERENTIAL/PLATELET
BASOS PCT: 2 % — AB (ref 0–1)
Basophils Absolute: 0 10*3/uL (ref 0.0–0.1)
EOS ABS: 0.2 10*3/uL (ref 0.0–0.7)
Eosinophils Relative: 13 % — ABNORMAL HIGH (ref 0–5)
HCT: 20.2 % — ABNORMAL LOW (ref 39.0–52.0)
Hemoglobin: 6.6 g/dL — CL (ref 13.0–17.0)
LYMPHS ABS: 0.2 10*3/uL — AB (ref 0.7–4.0)
Lymphocytes Relative: 17 % (ref 12–46)
MCH: 26.8 pg (ref 26.0–34.0)
MCHC: 32.7 g/dL (ref 30.0–36.0)
MCV: 82.1 fL (ref 78.0–100.0)
MONO ABS: 0.6 10*3/uL (ref 0.1–1.0)
Monocytes Relative: 54 % — ABNORMAL HIGH (ref 3–12)
NEUTROS PCT: 14 % — AB (ref 43–77)
Neutro Abs: 0.2 10*3/uL — ABNORMAL LOW (ref 1.7–7.7)
PLATELETS: 315 10*3/uL (ref 150–400)
RBC: 2.46 MIL/uL — ABNORMAL LOW (ref 4.22–5.81)
RDW: 19.7 % — AB (ref 11.5–15.5)
WBC: 1.2 10*3/uL — AB (ref 4.0–10.5)

## 2014-12-05 LAB — PREPARE RBC (CROSSMATCH)

## 2014-12-05 LAB — GLUCOSE, CAPILLARY
GLUCOSE-CAPILLARY: 132 mg/dL — AB (ref 65–99)
GLUCOSE-CAPILLARY: 146 mg/dL — AB (ref 65–99)
GLUCOSE-CAPILLARY: 126 mg/dL — AB (ref 65–99)
GLUCOSE-CAPILLARY: 146 mg/dL — AB (ref 65–99)

## 2014-12-05 MED ORDER — KETOROLAC TROMETHAMINE 15 MG/ML IJ SOLN
15.0000 mg | Freq: Once | INTRAMUSCULAR | Status: AC
  Administered 2014-12-05: 15 mg via INTRAVENOUS
  Filled 2014-12-05: qty 1

## 2014-12-05 MED ORDER — TRACE MINERALS CR-CU-MN-SE-ZN 10-1000-500-60 MCG/ML IV SOLN
INTRAVENOUS | Status: AC
  Administered 2014-12-05: 18:00:00 via INTRAVENOUS
  Filled 2014-12-05: qty 2232

## 2014-12-05 MED ORDER — FAT EMULSION 20 % IV EMUL
240.0000 mL | INTRAVENOUS | Status: AC
  Administered 2014-12-05: 240 mL via INTRAVENOUS
  Filled 2014-12-05: qty 250

## 2014-12-05 MED ORDER — SODIUM CHLORIDE 0.9 % IV SOLN: Freq: Once | INTRAVENOUS | Status: AC

## 2014-12-05 MED ORDER — ACETAMINOPHEN 160 MG/5ML PO SOLN: 650.0000 mg | ORAL | Status: DC | PRN

## 2014-12-05 MED ORDER — SODIUM CHLORIDE 0.9 % IV SOLN
Freq: Once | INTRAVENOUS | Status: AC
  Administered 2014-12-05: 08:00:00 via INTRAVENOUS

## 2014-12-05 NOTE — Progress Notes (Signed)
Patient ID: Darryl Griffith, male   DOB: 1945-10-07, 69 y.o.   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 sodium chloride   Intravenous Once  . sodium chloride   Intravenous Once  . sodium chloride   Intravenous Once  . bisacodyl  10 mg Rectal Daily  . DAPTOmycin (CUBICIN)  IV  600 mg Intravenous Q24H  . heparin subcutaneous  5,000 Units Subcutaneous 3 times per day  . HYDROmorphone PCA 0.3 mg/mL   Intravenous 6 times per day  . insulin aspart  0-9 Units Subcutaneous 4 times per day  . lip balm  1 application Topical BID  . methocarbamol (ROBAXIN)  IV  500 mg Intravenous 3 times per day  . metronidazole  500 mg Intravenous Q8H  . pantoprazole (PROTONIX) IV  40 mg Intravenous Q12H    SUBJECTIVE: He states that he is not feeling any better. He rates his abdominal pain at 4 out of 10.  Review of Systems: Pertinent items are noted in HPI.  Past Medical History  Diagnosis Date  . Hypertension   . Anginal pain     hx of CP saw Dr Wynonia Lawman 09/20/2013 H&P per chart   . Left Retroperitoneal mass s/p resection/L nephrectomy 07/13/2014   . Postoperative anemia due to acute blood loss 07/15/2014  . SBO (small bowel obstruction) 08/01/2014, 10/11/14  . Retroperitoneal fluid collection   . 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 Impaired fasting glucose 11/25/2014  . Benign neoplasm of colon 11/25/2014  . Actinic keratosis 11/25/2014  . Mixed hyperlipidemia  11/25/2014    Social History  Substance Use Topics  . Smoking status: Never Smoker   . Smokeless tobacco: Never Used  . Alcohol Use: No    Family History  Problem Relation Age of Onset  . Adopted: Yes  . Family history unknown: Yes   Allergies  Allergen Reactions  . Compazine [Prochlorperazine] Other (See Comments)    Made pt really lethargic, and sleepy    OBJECTIVE: Filed Vitals:   12/05/14 1036 12/05/14 1233 12/05/14 1234 12/05/14 1557  BP: 142/75 148/76  144/77  Pulse: 109 113  134  Temp: 98.1 F (36.7 C) 98.6 F (37 C)  99 F (37.2 C)  TempSrc: Oral Oral  Oral  Resp: 26 27 22 26   Height:      Weight:      SpO2: 98% 97% 96% 95%   Body mass index is 24.79 kg/(m^2).  General: He is sitting up in a chair. He has a very flat affect and appears to check to date. Skin: No rash Lungs: Clear Cor: Distant but regular S1 and S2 with no murmurs Abdomen: Soft with minimal diffuse tenderness. Left upper quadrant drain in place. 10 mL of output noted today.  Lab Results Lab Results  Component Value Date  WBC 1.2* 12/05/2014   HGB 6.6* 12/05/2014   HCT 20.2* 12/05/2014   MCV 82.1 12/05/2014   PLT 315 12/05/2014    Lab Results  Component Value Date   CREATININE 1.13 12/04/2014   BUN 37* 12/04/2014   NA 130* 12/04/2014   K 4.0 12/04/2014   CL 100* 12/04/2014   CO2 24 12/04/2014    Lab Results  Component Value Date   ALT 8* 12/04/2014   AST 16 12/04/2014   ALKPHOS 53 12/04/2014   BILITOT 0.6 12/04/2014     Microbiology: Recent Results (from the past 240 hour(s))  Culture, routine-abscess     Status: None   Collection Time: 11/27/14 12:22 PM  Result Value Ref Range Status   Specimen Description   Final    ABSCESS ASPIRATION OF RECURRENT LEFT RETROPERITONEAL FLUID. Nephrectomy   Special Requests Normal  Final   Gram Stain   Final    ABUNDANT WBC PRESENT,BOTH PMN AND MONONUCLEAR NO SQUAMOUS EPITHELIAL CELLS SEEN NO ORGANISMS SEEN Performed at [facility]    Culture   Final    NO GROWTH 3 DAYS Performed at [facility]    Report Status 11/30/2014 FINAL  Final  Anaerobic culture     Status: None   Collection Time: 11/27/14 12:22 PM  Result Value Ref Range Status   Specimen Description ABSCESS LEFT RETROPERITONEAL FLUID. Nephrectomy   Final   Special Requests Normal  Final   Gram Stain   Final    ABUNDANT WBC PRESENT,BOTH PMN AND MONONUCLEAR NO SQUAMOUS EPITHELIAL CELLS SEEN NO ORGANISMS SEEN Performed at [facility]    Culture   Final    NO ANAEROBES ISOLATED Performed at [facility]    Report Status 12/02/2014 FINAL  Final  Fungus culture w smear     Status: None (Preliminary result)   Collection Time: 11/27/14 12:22 PM  Result Value Ref Range Status   Specimen Description ABSCESS LEFT RETROPERITONEAL FLUID/Nephrectomy  Final   Special Requests Normal  Final   Fungal Smear   Final    NO YEAST OR FUNGAL ELEMENTS SEEN Performed at [facility]    Culture   Final    CULTURE IN PROGRESS FOR FOUR WEEKS Performed at [facility]    Report Status PENDING  Incomplete  Surgical pcr screen     Status: None   Collection Time: 11/29/14  6:02 AM  Result Value Ref Range Status   MRSA, PCR NEGATIVE NEGATIVE Final   Staphylococcus aureus NEGATIVE NEGATIVE Final    Comment:        The Xpert SA Assay (FDA approved for NASAL specimens in patients over 47 years of age), is one component of a comprehensive surveillance program.  Test performance has been validated by [facility] for patients greater than or equal to 65 year old. It is not intended to diagnose infection nor to guide or monitor treatment.   Culture, routine-abscess     Status: None   Collection Time: 11/29/14 10:23 AM  Result Value Ref Range Status   Specimen Description ABSCESS BACK  Final   Special Requests NONE  Final   Gram Stain   Final    RARE WBC PRESENT, PREDOMINANTLY PMN NO SQUAMOUS EPITHELIAL CELLS  SEEN NO ORGANISMS SEEN Performed at [facility]    Culture   Final    NO GROWTH 3 DAYS Performed at [facility]    Report Status 12/02/2014 FINAL  Final  Anaerobic  culture     Status: None   Collection Time: 11/29/14 10:25 AM  Result Value Ref Range Status   Specimen Description ABSCESS BACK  Final   Special Requests NONE  Final   Gram Stain   Final    RARE WBC PRESENT, PREDOMINANTLY PMN NO SQUAMOUS EPITHELIAL CELLS SEEN NO ORGANISMS SEEN Performed at [facility]    Culture   Final    NO ANAEROBES ISOLATED Performed at [facility]    Report Status 12/04/2014 FINAL  Final  Body fluid culture     Status: None   Collection Time: 11/29/14 11:42 AM  Result Value Ref Range Status   Specimen Description FLUID PERITONEAL LEFT PERITONEAL FLUID  Final   Special Requests NONE  Final   Gram Stain   Final    FEW WBC PRESENT, PREDOMINANTLY PMN NO ORGANISMS SEEN    Culture   Final    NO GROWTH 3 DAYS Performed at [facility]    Report Status 12/02/2014 FINAL  Final  Anaerobic culture     Status: None   Collection Time: 11/29/14 11:42 AM  Result Value Ref Range Status   Specimen Description FLUID PERITONEAL  Final   Special Requests NONE  Final   Gram Stain   Final    FEW WBC PRESENT, PREDOMINANTLY PMN NO ORGANISMS SEEN    Culture   Final    NO ANAEROBES ISOLATED Performed at [facility]    Report Status 12/04/2014 FINAL  Final     ASSESSMENT: He had one fever spike 2 days ago. His most recent abscess and retroperitoneal fluid cultures from 11/29/2014 are negative. Linezolid was changed to daptomycin 2 days ago after he developed some worsening leukopenia and anemia.  PLAN: 1. Continue daptomycin and metronidazole for now  Michel Bickers, MD [facility] [place] [phone] pager   [phone] cell 12/05/2014, 4:02 PM

## 2014-12-05 NOTE — Progress Notes (Signed)
PROGRESS NOTE  Darryl Griffith ZOX:[id number] DOB: Jan 29, 1946 DOA: 11/24/2014 PCP: Gennette Pac, MD  HPI: Darryl Griffith is a 69 y.o. 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 General surgery was consulted on admission. Patient did not improve with conservative measures and is scheduled to undergo surgery today 8/31. He underwent incision and drainage of left back abscess, exploratory laparotomy, lysis of adhesions, small bowel resection and placement of drain into left retroperitoneal abscess.   Subjective / 24 H Interval events Has not used PCA much overnight, plan to change to IV dilaudid prn and stop the PCA , but patient refused.  No new complaitns. Has not beed out to bed today yet. ordered PT.   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 Gram stain negative.  - appreciate ID recommendations.   HTN - prn hydralazine - BP stable  Hypokalemia - stable  Hypomagnesemia - improved with repletion  Anemia - monitor CBC, likely of chronic illness, and from anemia of blood loss.  - one unit of PRBC transfusion ordered, and repeat H&h is around 7..  - protonix added by surgery.  - repeat CBC shows hemoglobin around 6, ordered 2 untis of prbc.  - rpeat cbc in am.   Hyponatremia: Post op , will need hydration and repeat in am show slight improvement. .    Neutropenia: suspision of myelosuppresion by zyvox. Changed to cubicin after speaking with Dr Tommy Medal. Discussed with pharmacy.   TAchycardia: Worsened over the last 24 hours, probably from the pain  And anemia and oob to chair. He is asymptomatic. EKG shows sinus tachycardia.  Echo unremarkable.  Tachycardia improving.    Diet: Diet NPO time specified Except for: Sips with Meds TPN (CLINIMIX-E) Adult Fluids: NS DVT Prophylaxis: SCD  Code Status: Full Code Family Communication: none at  bedside  Disposition Plan: remain inpatient, ordered PT eval. Possibly to SNF   Consultants:  General surgery   ID  Procedures:  incision and drainage of left back abscess, exploratory laparotomy, lysis of adhesions, small bowel resection and placement of drain into left retroperitoneal abscess.  Antibiotics  Anti-infectives    Start     Dose/Rate Route Frequency Ordered Stop   12/03/14 1300  DAPTOmycin (CUBICIN) 600 mg in sodium chloride 0.9 % IVPB     600 mg 224 mL/hr over 30 Minutes Intravenous Every 24 hours 12/03/14 1157     11/29/14 0830  [  MAR Hold]  ceFAZolin (ANCEF) IVPB 2 g/50 mL premix     (MAR Hold since 11/29/14 0825)   2 g 100 mL/hr over 30 Minutes Intravenous On call to O.R. 11/28/14 0850 11/29/14 0908   11/25/14 0000  metroNIDAZOLE (FLAGYL) IVPB 500 mg     500 mg 100 mL/hr over 60 Minutes Intravenous Every 8 hours 11/24/14 2355       11/25/14 0000  linezolid (ZYVOX) IVPB 600 mg  Status:  Discontinued     600 mg 300 mL/hr over 60 Minutes Intravenous Every 12 hours 11/24/14 2355 12/03/14 1145      Studies No results found. Objective  Filed Vitals:   12/05/14 1233 12/05/14 1234 12/05/14 1557 12/05/14 1816  BP: 148/76  144/77 148/67  Pulse: 113  134 111  Temp: 98.6 F (37 C)  99 F (37.2 C) 100.5 F (38.1 C)  TempSrc: Oral  Oral Oral  Resp: 27 22 26 26   Height:      Weight:      SpO2: 97% 96% 95% 98%    Intake/Output Summary (Last 24 hours) at 12/05/14 1900 Last data filed at 12/05/14 1817  Gross per 24 hour  Intake    818 ml  Output   3030 ml  Net  -2212 ml   Filed Weights   11/24/14 2020  Weight: 73.936 kg (163 lb)   Exam:  GENERAL: NAD, NG tube in place  LUNGS: diminished at bases  HEART: tachycardic,  without murmur. 2+ pulses, no JVD, no peripheral edema  ABDOMEN: Soft, nontender, and nondistended. No bowel sounds.   EXTREMITIES: Without any cyanosis, clubbing, rash, lesions or edema.  Data Reviewed: Basic Metabolic Panel:  Recent Labs Lab 11/29/14 0440 11/30/14 0530 12/01/14 0453 12/03/14 1235 12/04/14 0600  NA 135 130* 131*  --  130*  K 4.1 4.4 4.2  --  4.0  CL 100* 99* 97*  --  100*  CO2 29 26 28   --  24  GLUCOSE 127* 172* 137*  --  130*  BUN 18 31* 29*  --  37*  CREATININE 0.91 1.13 1.00 1.21 1.13  CALCIUM 8.2* 7.6* 8.1*  --  8.2*  MG  --  1.8  --   --  2.1  PHOS  --  3.4  --   --  4.0   Liver Function Tests:  Recent Labs Lab 11/30/14 0530 12/04/14 0600  AST 14* 16  ALT 10* 8*  ALKPHOS 40 53  BILITOT 0.3 0.6  PROT 4.4* 5.1*  ALBUMIN 1.8* 2.4*   No results for input(s): LIPASE, AMYLASE in the last 168 hours. CBC:  Recent Labs Lab 12/01/14 0530 12/01/14 1550 12/02/14 1140 12/03/14 0528 12/04/14 0600 12/05/14 0510  WBC 2.9*  --  1.6* 1.6* 1.2* 1.2*  NEUTROABS  --   --  0.7* 0.6* 0.3* 0.2*  HGB 6.4* 7.4* 7.0* 7.4* 7.2* 6.6*  HCT 19.1* 21.9* 21.0*  22.3* 21.6* 20.2*  MCV 79.9  --  82.7 82.3 82.4 82.1  PLT 298  --  281 314 316 315   Scheduled Meds: . sodium chloride   Intravenous Once  . sodium chloride   Intravenous Once  . sodium chloride   Intravenous Once  . bisacodyl  10 mg Rectal Daily  . DAPTOmycin (CUBICIN)  IV  600 mg Intravenous Q24H  . heparin subcutaneous  5,000 Units Subcutaneous 3 times per day  . HYDROmorphone PCA 0.3 mg/mL   Intravenous 6 times per day  . insulin aspart  0-9 Units Subcutaneous 4 times per day  . lip balm  1 application Topical BID  . methocarbamol (ROBAXIN)  IV  500 mg Intravenous 3 times per day  . metronidazole  500 mg Intravenous Q8H  . pantoprazole (PROTONIX) IV  40 mg Intravenous Q12H   Continuous Infusions: . sodium chloride 10 mL/hr at 11/30/14 0426  . Marland KitchenTPN (CLINIMIX-E) Adult 93 mL/hr at 12/05/14 1748   And  . fat emulsion 240 mL (12/05/14 1748)    Hosie Poisson, MD [facility] Pager [phone]. If 7 PM - 7 AM, please contact night-coverage at [email or web address], password [facility] 12/05/2014, 7:00 PM  LOS: 11 days

## 2014-12-05 NOTE — Progress Notes (Signed)
PT Cancellation Note  Patient Details Name: Jaymond Waage MRN: [id number] DOB: [birth year]   Cancelled Treatment:    Reason Eval/Treat Not Completed: Medical issues which prohibited therapy (getting blood. will check back later as schedule allows.)   Claretha Cooper 12/05/2014, 1:37 PM Tresa Endo PT [phone]

## 2014-12-05 NOTE — Progress Notes (Signed)
CRITICAL VALUE ALERT  Critical value received:  Hgb 6.6  Date of notification:  [id number]  Time of notification:  0600  Critical value read back:yes  Nurse who received alert:  [id number]  MD notified (1st page):  K, Schorr  Time of first page:  0615  MD notified (2nd page):NA  Time of second page:NA  Responding MD:  Lamar Blinks  Time MD responded:  [phone]

## 2014-12-05 NOTE — Evaluation (Signed)
Physical Therapy Evaluation Patient Details Name: Darryl Griffith MRN: [id number] DOB: November 07, 1945 Today's Date: 12/05/2014   History of Present Illness  Darryl Griffith is a 69 y.o. 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 He denies any hematemesis, fevers or chills. A Ct scan was performed 1 day ago for cancer staging and revealed a high grade partial small bowl obstruction.  on 11/29/14 Incision and drainage left back abscess, exploratory laparotomy, lysis of adhesions, small bowel resection, placement of drain into left retroperitoneal abscess   Clinical Impression  Patient wanted to get to recliner, difficulty in ambulation due to so many lines and tubes. Patient will benefit from PT to address problems listed in note below.    Follow Up Recommendations Home health PT;SNF;Supervision/Assistance - 24 hour (depends on porogress.)    Equipment Recommendations  Rolling walker with 5" wheels    Recommendations for Other Services OT consult     Precautions / Restrictions Precautions Precautions: Fall Precaution Comments: monitor HR, NG tube, L abd JP      Mobility  Bed Mobility Overal bed mobility: Needs Assistance Bed Mobility: Supine to Sit     Supine to sit: Mod assist     General bed mobility comments: support to upright trunk, cues to try to roll first.  Transfers Overall transfer level: Needs assistance Equipment used: Rolling walker (2 wheeled) Transfers: Sit to/from [facility] Sit to Stand: Mod assist;+2 physical assistance;+2 safety/equipment Stand pivot transfers: Mod assist;+2 physical assistance;+2 safety/equipment       General transfer comment: cues for technique, assist for manging all of the lines.pivot from  bed to recliner. HR 142, sats > 90% RA while moving. replaced [place]  Ambulation/Gait                Stairs            Wheelchair Mobility    Modified Rankin (Stroke Patients Only)       Balance                                             Pertinent Vitals/Pain Pain Assessment: 0-10 Pain Score: 6  Pain Location: abdomen Pain Descriptors / Indicators: Contraction;Cramping Pain Intervention(s): Limited activity within patient's tolerance;Monitored during session;PCA encouraged    Home Living Family/patient expects to be discharged to:: Private residence Living Arrangements: Spouse/significant other;Children Available Help at Discharge: Family Type of Home: Apartment Home Access: Stairs to enter   Technical brewer of Steps: 1 Home Layout: One level Home Equipment: None      Prior Function Level of Independence: Independent               Hand Dominance        Extremity/Trunk Assessment   Upper Extremity Assessment: Generalized weakness           Lower Extremity Assessment: Generalized weakness         Communication      Cognition Arousal/Alertness: Awake/alert Behavior During Therapy: Flat affect Overall Cognitive Status: Within Functional Limits for tasks assessed  General Comments      Exercises        Assessment/Plan    PT Assessment Patient needs continued PT services  PT Diagnosis Difficulty walking;Generalized weakness;Acute pain   PT Problem List Decreased strength;Decreased activity tolerance;Decreased mobility;Cardiopulmonary status limiting activity;Decreased knowledge of precautions;Decreased safety awareness;Decreased knowledge of use of DME;Pain  PT Treatment Interventions DME instruction;Gait training;Functional mobility training;Therapeutic activities;Therapeutic exercise;Stair training;Patient/family education   PT Goals (Current goals can be found in the Care  Plan section) Acute Rehab PT Goals Patient Stated Goal: to get to recliner PT Goal Formulation: With patient Time For Goal Achievement: 12/19/14 Potential to Achieve Goals: Good    Frequency Min 3X/week   Barriers to discharge        Co-evaluation               End of Session   Activity Tolerance: Patient limited by fatigue;Patient limited by pain Patient left: in chair;with call bell/phone within reach Nurse Communication: Mobility status         Time: 4103-0131 PT Time Calculation (min) (ACUTE ONLY): 22 min   Charges:   PT Evaluation $Initial PT Evaluation Tier I: 1 Procedure     PT G CodesClaretha Cooper 12/05/2014, 5:33 PM Tresa Endo PT [phone]

## 2014-12-05 NOTE — Progress Notes (Signed)
PARENTERAL NUTRITION CONSULT NOTE - Follow-Up  Pharmacy Consult for TPN Indication: SBO  Allergies  Allergen Reactions  . Compazine [Prochlorperazine] Other (See Comments)    Made pt really lethargic, and sleepy   Patient Measurements: Height: 5' 8"  (172.7 cm) Weight: 163 lb (73.936 kg) IBW/kg (Calculated) : 68.4  Vital Signs: Temp: 98.5 F (36.9 C) (09/06 0440) Temp Source: Oral (09/06 0440) BP: 138/61 mmHg (09/06 0440) Pulse Rate: 113 (09/06 0440) Intake/Output from previous day: 09/05 0701 - 09/06 0700 In: 1716 [I.V.:120; TPN:1236] Out: 2170 [Urine:1200; Emesis/NG output:950; Drains:20] Intake/Output from this shift:    Labs:  Recent Labs  12/03/14 0528 12/04/14 0600 12/05/14 0510  WBC 1.6* 1.2* 1.2*  HGB 7.4* 7.2* 6.6*  HCT 22.3* 21.6* 20.2*  PLT 314 316 315    Recent Labs  12/03/14 1235 12/04/14 0600  NA  --  130*  K  --  4.0  CL  --  100*  CO2  --  24  GLUCOSE  --  130*  BUN  --  37*  CREATININE 1.21 1.13  CALCIUM  --  8.2*  MG  --  2.1  PHOS  --  4.0  PROT  --  5.1*  ALBUMIN  --  2.4*  AST  --  16  ALT  --  8*  ALKPHOS  --  53  BILITOT  --  0.6  PREALBUMIN  --  6.1*  TRIG  --  91   Estimated Creatinine Clearance: 58.8 mL/min (by C-G formula based on Cr of 1.13).    Recent Labs  12/04/14 1411 12/04/14 1743 12/05/14 0125  GLUCAP 117* 145* 132*    Medical History: Past Medical History  Diagnosis Date  . Hypertension   . Anginal pain     hx of CP saw Dr Wynonia Lawman 09/20/2013 H&P per chart   . Left Retroperitoneal mass s/p resection/L nephrectomy 07/13/2014   . Postoperative anemia due to acute blood loss 07/15/2014  . SBO (small bowel obstruction) 08/01/2014, 10/11/14  . Retroperitoneal fluid collection   . 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 Impaired fasting glucose 11/25/2014  . Benign neoplasm of colon 11/25/2014  . Actinic keratosis 11/25/2014  . Mixed  hyperlipidemia 11/25/2014    Insulin Requirements: 4 units over the past 24 hours (on sensitive SSI q6h) - no hx DM  Current Nutrition: NPO - NG tube   IVF: NS at [facility] access: PICC placed 10/12/14 TPN start date: 8/28  ASSESSMENT                                                                                                          HPI:  13 yoM s/p resection L retroperitoneal sarcoma, L nephrectomy 07/13/14 developed VRE abscess. Hx abd bloating, N/V 2 days PTA, SBO seen on CT for Ca restaging.  Hx recurrent SBO. Begin TPN per Pharmacy  Significant events:   8/29: plan for exp laparotomy on 8/31; s/p aspiration of L retroperitoneal fluid collection on  8/29 8/31:  s/p exp lap -->  I&D L back abscess, lysis of adhesions, small bowel resection, placement og drain into L retroperitoneal abscess 9/2: bloody NGT output, PPI IV BID added;  change clinimix E 5/15 to E 5/20  Today:   Glucose (goal <150): all readings < 150   Electrolytes - Na low at 130;  K, mag, phos WNL (on 9/5)  Renal - WNL   LFTs - AST/ALT low, Alk Phos, Tbili WNL (on 9/5)  TGs - 91 (9/5)  Prealbumin - 6.1 (9/5)  NUTRITIONAL GOALS                                                                                             RD recs:   Kcal: 2300-2500  Protein: 110-120 grams  Fluid: 2.2-2.5 L/day  Clinimix E 5/20 at a goal rate of 93 ml/hr + 20% fat emulsion at 10 ml/hr to provide: 112 g/day protein, 2444 Kcal/day (per dietician recommendations)   PLAN                                                                                                                         At 1800 today:  Continue Clinimix E 5/20 at goal rate of 93 ml/hr.  20% fat emulsion at 10 ml/hr.  TPN to contain standard multivitamins and trace elements.  Continue IVF at [facility].  Continue sensitive SSI q6h.  TPN lab panels on Mondays & Thursdays.  F/u daily.   Dia Sitter, PharmD, BCPS 12/05/2014 7:21 AM

## 2014-12-05 NOTE — Progress Notes (Signed)
Patient ID: Darryl Griffith, male   DOB: 22-Jun-1945, 69 y.o.   MRN: [id number] 6 Days Post-Op  Subjective: Pain improving slowly.  Was OOB yesterday.    Objective: Vital signs in last 24 hours: Temp:  [98.5 F (36.9 C)-99.3 F (37.4 C)] 98.5 F (36.9 C) (09/06 0440) Pulse Rate:  [113-117] 113 (09/06 0440) Resp:  [22-25] 24 (09/06 0440) BP: (137-160)/(61-73) 138/61 mmHg (09/06 0440) SpO2:  [97 %-99 %] 97 % (09/06 0410) FiO2 (%):  [0 %] 0 % (09/06 0410) Last BM Date: 11/28/14  Intake/Output from previous day: 09/05 0701 - 09/06 0700 In: 1716 [I.V.:120; TPN:1236] Out: 2170 [Urine:1200; Emesis/NG output:950; Drains:20] Intake/Output this shift: Total I/O In: 10 [I.V.:10] Out: -   General appearance: alert, cooperative and fatigued GI: normal findings: soft, non-tender Neurologic: Flat affect Incision/Wound: Dressing clean and dry.    Lab Results:   Recent Labs  12/04/14 0600 12/05/14 0510  WBC 1.2* 1.2*  HGB 7.2* 6.6*  HCT 21.6* 20.2*  PLT 316 315   BMET  Recent Labs  12/03/14 1235 12/04/14 0600  NA  --  130*  K  --  4.0  CL  --  100*  CO2  --  24  GLUCOSE  --  130*  BUN  --  37*  CREATININE 1.21 1.13  CALCIUM  --  8.2*     Studies/Results: No results found.  Anti-infectives: Anti-infectives    Start     Dose/Rate Route Frequency Ordered Stop   12/03/14 1300  DAPTOmycin (CUBICIN) 600 mg in sodium chloride 0.9 % IVPB     600 mg 224 mL/hr over 30 Minutes Intravenous Every 24 hours 12/03/14 1157     11/29/14 0830  [MAR Hold]  ceFAZolin (ANCEF) IVPB 2 g/50 mL premix     (MAR Hold since 11/29/14 0825)   2 g 100 mL/hr over 30 Minutes Intravenous On call to O.R. 11/28/14 0850 11/29/14 0908   11/25/14 0000  metroNIDAZOLE (FLAGYL) IVPB 500 mg     500 mg 100 mL/hr over 60 Minutes Intravenous Every 8 hours 11/24/14 2355     11/25/14 0000  linezolid (ZYVOX) IVPB 600 mg  Status:  Discontinued     600 mg 300 mL/hr over 60 Minutes Intravenous Every 12 hours  11/24/14 2355 12/03/14 1145      Assessment/Plan: left retroperitoneal liposarcoma s/p resection and left nephrectomy 07/13/14 s/p XRT Post op intra-abdominal abscesses(VRE) Recurrent SBO  Status post ex lap/lysis of adhesions, small bowel resection, drainage back abscess - 11/29/2014  Continue NGT/NPO/TNA  Prolonged ileus not uncommon if long chronic partial bowel obstruction.   LOS: 11 days    Darryl Griffith 12/05/2014

## 2014-12-06 LAB — CBC
HCT: 23.4 % — ABNORMAL LOW (ref 39.0–52.0)
Hemoglobin: 7.6 g/dL — ABNORMAL LOW (ref 13.0–17.0)
MCH: 26.9 pg (ref 26.0–34.0)
MCHC: 32.5 g/dL (ref 30.0–36.0)
MCV: 82.7 fL (ref 78.0–100.0)
PLATELETS: 377 10*3/uL (ref 150–400)
RBC: 2.83 MIL/uL — AB (ref 4.22–5.81)
RDW: 19.2 % — ABNORMAL HIGH (ref 11.5–15.5)
WBC: 1.9 10*3/uL — AB (ref 4.0–10.5)

## 2014-12-06 LAB — GLUCOSE, CAPILLARY
GLUCOSE-CAPILLARY: 135 mg/dL — AB (ref 65–99)
GLUCOSE-CAPILLARY: 135 mg/dL — AB (ref 65–99)
GLUCOSE-CAPILLARY: 143 mg/dL — AB (ref 65–99)
GLUCOSE-CAPILLARY: 158 mg/dL — AB (ref 65–99)

## 2014-12-06 MED ORDER — FAT EMULSION 20 % IV EMUL
240.0000 mL | INTRAVENOUS | Status: AC
  Administered 2014-12-06: 240 mL via INTRAVENOUS
  Filled 2014-12-06: qty 250

## 2014-12-06 MED ORDER — TRACE MINERALS CR-CU-MN-SE-ZN 10-1000-500-60 MCG/ML IV SOLN
INTRAVENOUS | Status: AC
  Administered 2014-12-06: 18:00:00 via INTRAVENOUS
  Filled 2014-12-06: qty 2232

## 2014-12-06 NOTE — Clinical Social Work Placement (Addendum)
   CLINICAL SOCIAL WORK PLACEMENT  NOTE  Date:  12/06/2014  Patient Details  Name: Darryl Griffith MRN: [id number] Date of Birth: [birth year]  Clinical Social Work is seeking post-discharge placement for this patient at the [place] level of care (*CSW will initial, date and re-position this form in  chart as items are completed):  Yes   Patient/family provided with [place] Work Department's list of facilities offering this level of care within the geographic area requested by the patient (or if unable, by the patient's family).  Yes   Patient/family informed of their freedom to choose among providers that offer the needed level of care, that participate in Medicare, Medicaid or managed care program needed by the patient, have an available bed and are willing to accept the patient.  Yes   Patient/family informed of 's ownership interest in [facility] and [facility], as well as of the fact that they are under no obligation to receive care at these facilities.  PASRR submitted to EDS on   12/08/14    PASRR number received on     12/08/14  Existing PASRR number confirmed on       FL2 transmitted to all facilities in geographic area requested by pt/family on     12/08/14  FL2 transmitted to all facilities within larger geographic area on       Patient informed that his/her managed care company has contracts with or will negotiate with certain facilities, including the following:            Patient/family informed of bed offers received.  Patient chooses bed at       Physician recommends and patient chooses bed at      Patient to be transferred to   on  .  Patient to be transferred to facility by       Patient family notified on   of transfer.  Name of family member notified:        PHYSICIAN       Additional Comment:    _______________________________________________ Boone Master, [place] 12/06/2014, 2:33 PM

## 2014-12-06 NOTE — Progress Notes (Signed)
Progress Note   Darryl Griffith VPX:[id number] DOB: 01-31-1946 DOA: 11/24/2014 PCP: Gennette Pac, MD   Brief Narrative:   Darryl Griffith is an 69 y.o. 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 General surgery was consulted on admission. Patient did not improve with conservative measures and underwent surgery 11/29/14: incision and drainage of left back abscess, exploratory laparotomy, lysis of adhesions, small bowel resection and placement of drain into left retroperitoneal abscess.   Assessment/Plan:   Principal problem:  SBO - Gen. surgery following, appreciate input. Continue NPO, NG tube, TNA. -Underwent surgical repair on 8/31 with exploratory laparotomy, lysis of adhesions, small bowel resection, placement of drain into left retroperitoneal abscess.   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 Follow-up oncology post discharge.  Left retroperitoneal and left upper quadrant fluid collection  - Cultures initially positive for VRE. Most recent cultures from 11/29/14 were negative. - Status post IR drainage 11/27/14. - ID following. Has been on antibiotics for 56 days, day #20 Flagyl and day #4 daptomycin.  HTN - Continue hydralazine as needed.  Hypokalemia - Resolved.  Hypomagnesemia - Resolved.  Anemia - Monitoring blood counts. Has required blood transfusion this hospital stay.  Hyponatremia - Monitor sodium, improved.   Neutropenia - Felt to be related to treatment side Vioxx. Changed to cubicin after speaking with Dr Tommy Medal. Discussed with pharmacy.   Tachycardia - Multi-factorial  with infection, anemia contributory.  DVT prophylaxis - SCDs.  Code Status: Full Code Family Communication: No family at bedside.  Disposition Plan: Unclear at present, remains on TNA. Possibly will need SNF when medically stable.  IV Access:    PICC line placed 10/12/14   Procedures and diagnostic studies:   Ct Chest W Contrast  11/23/2014   CLINICAL DATA:  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  CONTRAST:  138mL OMNIPAQUE IOHEXOL 300 MG/ML  SOLN  COMPARISON:  11/07/2014  FINDINGS: CT CHEST FINDINGS  Thoracic inlet: No neck base or axillary masses or adenopathy. Normal thyroid.  Mediastinum and hila: Heart normal in size and configuration. Mild prominence of the ascending aorta measuring 4 cm in diameter. 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 Right PICC tip projects in the lower superior vena cava. No mediastinal or hilar masses or pathologically enlarged lymph nodes.  Lungs and pleura: No lung masses or suspicious nodules. Small left pleural effusion associated with mild dependent left lower lobe atelectasis. Small focus of reticular nodular opacity in the posterior right lower lobe is likely additional atelectasis or scarring. There is no lung consolidation or edema. No pneumothorax.  CT ABDOMEN AND PELVIS FINDINGS  There is a fluid collection that extends from the posterior medial left superior retroperitoneum, adjacent to the spleen, to the lateral margin of the superior left psoas muscle. This measures  4.9 x 2.2 cm  transversely, previously 5.6 x 2.6 cm. It measures 7.6 cm an approximate superior inferior length, previously 9.2 cm. Fluid attenuation and multiple blastic and clips lie in the left upper quadrant between the posterior wall of the stomach and upper abdominal aorta. There is thickening of this portion of the stomach wall. These changes partly surround the pancreatic tail and surround the left diaphragmatic crus. No left adrenal gland is visible. Left kidney is surgically absent.  Liver and spleen:  Unremarkable.  Gallbladder and biliary tree: Unremarkable.  Pancreas: There is some heterogeneous lower attenuation along the pancreatic tail adjacent to the left upper quadrant surgical changes and low-attenuation soft tissue. This is similar to the prior exam, likely reactive edema. No other pancreatic abnormality.  Right adrenal glands: Normal.  Right kidney, ureter, bladder: 15 mm midpole cyst. No other right renal masses, no stones and no hydronephrosis. Normal ureters. Unremarkable bladder.  Lymph nodes:  No pathologically enlarged lymph nodes.  Ascites:  Trace ascites in the pelvis.  Gastrointestinal: There is a partial small bowel obstruction. The transition point is in the left upper adjacent to the postsurgical changes and residual left retroperitoneal fluid collection. Small bowel proximal to the transition is dilated to a maximum of 5.3 cm. Small bowel distal to this is decompressed. The colon is partly decompressed. There are scattered colonic diverticula mostly along the sigmoid colon. No diverticulitis. Normal-sized appendix is visualized. No bowel wall thickening or inflammatory changes. No free air.  Abdominal wall: There is edema in the subcutaneous fat mostly along the flanks. There is midline abdominal incision. No hernias.  Vascular: Atherosclerotic changes are noted along the infrarenal abdominal aorta and branch vessels. No aneurysm.  MUSCULOSKELETAL FINDINGS  No osteoblastic or osteolytic lesions.  Degenerative changes noted of the visualized spine. Bones are demineralized.  IMPRESSION: 1. Residual left retroperitoneal and left upper quadrant collection associated with postsurgical changes. The defined residual collection, along the posterior medial left retroperitoneum abutting the spleen and left psoas muscle, is smaller, now measuring 4.9 x 2.3 x 7.6 cm, previously 5.6 x 2.6 x 9.2 cm. Left upper quadrant postsurgical and inflammatory changes include thickening of the posterior wall of the stomach, stable. There are numerous vascular clips in the left retroperitoneum and left posterior medial upper quadrant. 2. Fairly high-grade partial small bowel obstruction with the transition point in the posterior medial left upper quadrant adjacent to the residual collection and postsurgical changes. This is similar to prior study. 3. No convincing local residual retroperitoneal sarcoma. 4. No evidence of metastatic disease from the sarcoma.   Electronically Signed   By: Lajean Manes M.D.   On: 11/23/2014 14:36   Ct Abdomen Pelvis W Contrast  11/23/2014   CLINICAL DATA:  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  CONTRAST:  16mL OMNIPAQUE IOHEXOL 300 MG/ML  SOLN  COMPARISON:  11/07/2014  FINDINGS: CT CHEST FINDINGS  Thoracic inlet: No neck base or axillary masses or adenopathy. Normal thyroid.  Mediastinum and hila: Heart normal in size and configuration. Mild prominence of the ascending aorta measuring 4 cm in diameter. 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 Right PICC tip projects in the lower superior vena cava. No mediastinal or hilar masses or pathologically enlarged lymph nodes.  Lungs and pleura: No lung masses or suspicious nodules. Small left pleural effusion associated with mild dependent left lower lobe atelectasis. Small focus of reticular nodular opacity in the posterior right lower lobe is likely additional atelectasis or scarring. There is no lung consolidation or edema. No pneumothorax.  CT ABDOMEN AND PELVIS FINDINGS  There is a fluid collection that extends from the posterior medial left superior retroperitoneum, adjacent to the spleen, to the lateral margin of the superior left psoas muscle. This measures 4.9 x 2.2 cm transversely, previously 5.6 x 2.6 cm. It measures 7.6 cm an approximate superior inferior length, previously 9.2 cm. Fluid attenuation and multiple blastic and clips lie in the left upper quadrant between the posterior wall of the stomach and upper abdominal aorta. There is thickening of this portion of the stomach wall. These changes partly surround the pancreatic tail and surround the left diaphragmatic crus. No left adrenal gland is visible. Left kidney is surgically absent.  Liver and spleen:  Unremarkable.  Gallbladder and biliary tree: Unremarkable.  Pancreas: There is some heterogeneous lower attenuation along the pancreatic tail adjacent to the left upper quadrant surgical changes and low-attenuation soft tissue. This is similar to the prior exam, likely reactive edema. No other pancreatic abnormality.  Right adrenal glands: Normal.  Right kidney, ureter, bladder: 15 mm midpole cyst. No other right renal masses, no stones and no hydronephrosis. Normal ureters. Unremarkable bladder.  Lymph nodes:  No pathologically enlarged lymph nodes.  Ascites:  Trace ascites in the pelvis.  Gastrointestinal: There is a partial small bowel obstruction. The transition point is in the left upper adjacent to the postsurgical changes and  residual left retroperitoneal fluid collection. Small bowel proximal to the transition is dilated to a maximum of 5.3 cm. Small bowel distal to this is decompressed. The colon is partly decompressed. There are scattered colonic diverticula mostly along the sigmoid colon. No diverticulitis. Normal-sized appendix is visualized. No bowel wall thickening or inflammatory changes. No free air.  Abdominal wall: There is edema in the subcutaneous fat mostly along the flanks. There is midline abdominal incision. No hernias.  Vascular: Atherosclerotic changes are noted along the infrarenal abdominal aorta and branch vessels. No aneurysm.  MUSCULOSKELETAL FINDINGS  No osteoblastic or osteolytic lesions. Degenerative changes noted of the visualized spine. Bones are demineralized.  IMPRESSION: 1. Residual left retroperitoneal and left upper quadrant collection associated with postsurgical changes. The defined residual collection, along the posterior medial left retroperitoneum abutting the spleen and left psoas muscle, is smaller, now measuring 4.9 x 2.3 x 7.6 cm, previously 5.6 x 2.6 x 9.2 cm. Left upper quadrant postsurgical and inflammatory changes include thickening of the posterior wall of the stomach, stable. There are numerous vascular clips in the left retroperitoneum and left posterior medial upper quadrant. 2. Fairly high-grade partial small bowel obstruction with the transition point in the posterior medial left upper quadrant adjacent to the residual collection and postsurgical changes. This is similar to prior study. 3. No convincing local residual retroperitoneal sarcoma. 4. No evidence of metastatic disease from the sarcoma.   Electronically Signed   By: Lajean Manes M.D.   On: 11/23/2014 14:36   Ct Abdomen Pelvis W Contrast  11/07/2014   CLINICAL DATA:  Acute epigastric abdominal pain, vomiting.  EXAM: CT ABDOMEN AND PELVIS WITH CONTRAST  TECHNIQUE: Multidetector CT  imaging of the abdomen and pelvis was  performed using the standard protocol following bolus administration of intravenous contrast.  CONTRAST:  52mL OMNIPAQUE IOHEXOL 300 MG/ML SOLN, 153mL OMNIPAQUE IOHEXOL 300 MG/ML SOLN  COMPARISON:  CT scan of October 31, 2014.  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 Minimal left posterior basilar subsegmental atelectasis is noted.  No gallstones are noted. The liver and pancreas appear normal. Spleen is unchanged. Left retroperitoneal drainage catheter noted on prior exam has been removed. The fluid collection appears to have significantly enlarged compared to prior exam, now measuring 5.6 x 2.6 cm. This is consistent with worsening abscess. Surgical clips are noted in the left retroperitoneal region which are unchanged compared to prior exam. Status post left nephrectomy. Right adrenal gland and kidney appear normal. Left adrenal gland is not clearly visualized. Stable 3.3 x 2.7 cm lesser sac fluid collection is noted. 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 There is significantly increased small bowel dilatation with transition zone seen in the left upper quadrant in area of previously described surgery, best seen on image number 37 of series 2. More distal small bowel is normal in caliber. Sigmoid diverticulosis is noted without inflammation. Urinary bladder appears normal. No significant adenopathy is noted.  IMPRESSION: Extensive postsurgical changes are noted in left upper quadrant consistent with prior left nephrectomy. There is significantly increased proximal small bowel dilatation with transition zone seen in the left upper quadrant adjacent to postoperative changes, consistent with small bowel obstruction, most likely due to adhesion or scarring.  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  Left retroperitoneal drainage catheter noted on prior exam has been removed in the interval. Significantly  increased fluid collection containing gas is seen in this area consistent with abscess.  Stable abscess or fluid collection is seen in the lesser sac.   Electronically Signed   By: Marijo Conception, M.D.   On: 11/07/2014 19:37   Ct Aspiration  11/28/2014   CLINICAL DATA:  Recurrent left retroperitoneal fluid collection after previous resection of sarcoma. The patient now presents with a small bowel obstruction. Aspiration of the fluid collection has been requested prior to planned surgery to determine if there is any evidence of infection. This fluid collection was last aspirated on 11/10/2014 revealing vancomycin resistant Enterococcus and also previously contained a percutaneous drain for several months.  EXAM: CT GUIDED CATHETER DRAINAGE OF LEFT RETROPERITONEAL FLUID COLLECTION  ANESTHESIA/SEDATION: 1.5  Mg IV Versed; 75 mcg IV Fentanyl  Total Moderate Sedation Time: 15 minutes  PROCEDURE: The procedure risks, benefits, and alternatives were explained to the patient. Questions regarding the procedure were encouraged and answered. The patient understands and consents to the procedure. A time-out was performed prior to the procedure.  The left flank region was prepped with Betadine in a sterile fashion, and a sterile drape was applied covering the operative field. A sterile gown and sterile gloves were used for the procedure. Local anesthesia was provided with 1% Lidocaine.  Under CT guidance, a 5 [place] Yueh centesis catheter was advanced into the left retroperitoneal space. Fluid aspiration was performed through the 5 French catheter. The catheter was then removed. Additional CT was performed.  COMPLICATIONS: None  FINDINGS: Recurrent fluid collection in the medial left retroperitoneal space again identified, identical in appearance to scanning just prior to the previous aspiration procedure on 08/12. Approximately 10 mL of pink colored, turbid fluid was able to be aspirated. Material was  sent for culture  analysis. Postprocedural CT shows decrease in size of the fluid collection.  IMPRESSION: CT-guided aspiration of recurrent medial left retroperitoneal fluid collection yielding 10 mL of pink colored, turbid fluid. Fluid was sent for culture analysis.   Electronically Signed   By: Aletta Edouard M.D.   On: 11/28/2014 08:21   Ct Aspiration  11/10/2014   CLINICAL DATA:  History of recurrent left retroperitoneal fluid collection status post surgical excision of a retroperitoneal liposarcoma. Most recently, a drainage catheter was placed on 08/01/2014 and remained in place for 3 months with removal of the drain on 10/31/2014. Imaging on 11/07/2014 for abdominal pain demonstrated some reaccumulation of fluid in the previously drained medial left retroperitoneal space. Aspiration under CT guidance is now performed to determine if the fluid is infected.  EXAM: CT GUIDED ASPIRATION OF LEFT RETROPERITONEAL FLUID COLLECTION  ANESTHESIA/SEDATION: 3.0 Mg IV Versed 75 mcg IV Fentanyl  Total Moderate Sedation Time:  9 minutes.  PROCEDURE: The procedure, risks, benefits, and alternatives were explained to the patient. Questions regarding the procedure were encouraged and answered. The patient understands and consents to the procedure. A time-out was performed prior to the procedure.  The left flank region was prepped with Betadine in a sterile fashion, and a sterile drape was applied covering the operative field. A sterile gown and sterile gloves were used for the procedure. Local anesthesia was provided with 1% Lidocaine.  CT was performed in a prone position. Under CT guidance, a 5 [place] Yueh centesis catheter was advanced over a 19 gauge needle into the left retroperitoneal fluid collection. Aspiration was performed through the [facility] catheter. The aspirated fluid sample was sent for culture analysis including a aerobic culture, anaerobic culture and fungal culture. Additional saline lavage was performed through the catheter  with 10 mL of sterile saline. CT was performed after catheter removal.  COMPLICATIONS: None  FINDINGS: Aspiration yielded turbid yellow fluid. A total of 20 mL of fluid was able to be aspirated from the collection. After aspiration and additional saline lavage, fluid cavity size was smaller with a small amount of fluid remaining.  IMPRESSION: CT-guided aspiration and drainage of recurrent left retroperitoneal fluid collection. A 20 mL volume of turbid yellow fluid was able to be removed and was sent for culture analysis. Given chronicity of the collection and the presence of a drain in the collection for the last 3 months, aspiration only was performed to determine if the collection is infected prior to committing the patient to potential additional long-term catheter drainage or surgery.   Electronically Signed   By: Aletta Edouard M.D.   On: 11/10/2014 17:16   Ir Venipuncture 1yrs/older By Md  12/01/2014   CLINICAL DATA:  Anemia, difficult access, poor peripheral veins, access for blood transfusion  EXAM: ULTRASOUND GUIDANCE FOR VASCULAR ACCESS  PERIPHERAL IV START BY PHYSICIAN.  Date:  9/2/20169/05/2014 5:13 pm  Radiologist:  M. Daryll Brod, MD  Guidance:  Ultrasound  FLUOROSCOPY TIME:  None.  MEDICATIONS AND MEDICAL HISTORY: None.  ANESTHESIA/SEDATION: None.  CONTRAST:  None.  COMPLICATIONS: None immediate  PROCEDURE: Informed consent was obtained from the patient following explanation of the procedure, risks, benefits and alternatives. The patient understands, agrees and consents for the procedure. All questions were addressed. A time out was performed.  Maximal barrier sterile technique utilized including caps, mask, sterile gowns, sterile gloves, large sterile drape, hand hygiene, and ChloraPrep  Preliminary ultrasound performed. Left basilic vein demonstrated. Under sterile conditions and local anesthesia, a  37 gauge Angiocath was advanced into the left basilic vein under ultrasound. Position confirmed  with ultrasound. Images obtained for documentation. Blood aspirated easily followed by saline flush. This was secured externally with a sterile dressing. Ready for use.  IMPRESSION: Successful ultrasound left upper extremity basilic vein peripheral IV start.   Electronically Signed   By: [name] M.D.   On: 12/01/2014 17:19   Ir US Guide Vasc Access Left  12/01/2014   CLINICAL DATA:  Anemia, difficult access, poor peripheral veins, access for blood transfusion  EXAM: ULTRASOUND GUIDANCE FOR VASCULAR ACCESS  PERIPHERAL IV START BY PHYSICIAN.  Date:  9/2/20169/05/2014 5:13 pm  Radiologist:  M. Daryll Brod, MD  Guidance:  Ultrasound  FLUOROSCOPY TIME:  None.  MEDICATIONS AND MEDICAL HISTORY: None.  ANESTHESIA/SEDATION: None.  CONTRAST:  None.  COMPLICATIONS: None immediate  PROCEDURE: Informed consent was obtained from the patient following explanation of the procedure, risks, benefits and alternatives. The patient understands, agrees and consents for the procedure. All questions were addressed. A time out was performed.  Maximal barrier sterile technique utilized including caps, mask, sterile gowns, sterile gloves, large sterile drape, hand hygiene, and ChloraPrep  Preliminary ultrasound performed. Left basilic vein demonstrated. Under sterile conditions and local anesthesia, a 16 gauge Angiocath was advanced into the left basilic vein under ultrasound. Position confirmed with ultrasound. Images obtained for documentation. Blood aspirated easily followed by saline flush. This was secured externally with a sterile dressing. Ready for use.  IMPRESSION: Successful ultrasound left upper extremity basilic vein peripheral IV start.   Electronically Signed   By: [name] M.D.   On: 12/01/2014 17:19   Dg Abd 2 Views  11/27/2014   CLINICAL DATA:  Followup small bowel obstruction  EXAM: ABDOMEN - 2 VIEW  COMPARISON:  11/26/2014  FINDINGS: The nasogastric tube is identified with tip in the stomach. The side port is below  the GE junction. Dilated loops of small bowel and air-fluid levels are again noted. These measure up to 6.7 cm. Enteric contrast material is identified within the colon up to the level of the rectum.  IMPRESSION: 1. NG tube is within the stomach. 2. Persistent partial small bowel obstruction 3. Enteric contrast material is identified up the level of the rectum.   Electronically Signed   By: Kerby Moors M.D.   On: 11/27/2014 09:35   Dg Abd 2 Views  11/26/2014   CLINICAL DATA:  Abdominal distention with nausea and vomiting. Surgery in April. Small bowel obstruction symptoms.  EXAM: ABDOMEN - 2 VIEW  COMPARISON:  11/24/2014  FINDINGS: Upright and supine views. Upright view demonstrates persistent small bowel air-fluid levels, increased. Small left pleural effusion. There may be a nasogastric tube looped in the esophagus, incompletely imaged. Surgical clips in the upper abdomen. A metallic object projecting over the a lower chest is presumably external to the patient.  Supine images demonstrate small bowel distension. Example loop measures 6.7 cm today, versus 6.4 cm on the prior. Contrast is identified within the colon. No pneumatosis.  IMPRESSION: Similar small bowel obstruction pattern, without free intraperitoneal air or other acute complication.  The nasogastric may be looped in the distal esophagus. Incompletely imaged. These results will be called to the ordering clinician or representative by the Radiologist Assistant, and communication documented in the PACS or zVision Dashboard.   Electronically Signed   By: Abigail Miyamoto M.D.   On: 11/26/2014 10:07   Dg Abd Acute W/chest  11/24/2014   CLINICAL DATA:  Bloating  and nausea since yesterday. Vomiting today. History of bowel obstructions. History of retroperitoneal liposarcoma.  EXAM: DG ABDOMEN ACUTE W/ 1V CHEST  COMPARISON:  CT abdomen and pelvis 11/23/2014  FINDINGS: Right PICC catheter tip over the low SVC region. No pneumothorax. Linear fibrosis or  atelectasis in the left lung base. No focal airspace disease or consolidation in the lungs. Normal heart size and pulmonary vascularity.  Surgical clips in the upper abdomen. Contrast material is demonstrated in the right colon. There are prominent gas-filled distended small bowel loops in the mid abdomen and multiple air-fluid levels are demonstrated on the upright view. Appearance is consistent with mechanical small bowel obstruction. No free intra-abdominal air. No radiopaque stones. Degenerative changes in the spine.  IMPRESSION: No evidence of active pulmonary disease. Fibrosis or atelectasis in the left lung base.  Gaseous distention of mid abdominal small bowel with multiple air-fluid levels consistent with mechanical small bowel obstruction.   Electronically Signed   By: Lucienne Capers M.D.   On: 11/24/2014 21:51   Dg Abd Portable 1v  11/28/2014   CLINICAL DATA:  Evaluate NG tube repositioning.  EXAM: PORTABLE ABDOMEN - 1 VIEW  COMPARISON:  11/27/2014  FINDINGS: The nasogastric tube has been repositioned. The tip is in the fundus of the stomach in the side port is below the GE junction. Surgical clips are noted in the left upper quadrant of the abdomen. Again noted are dilated loops of bowel measuring up to 5 cm.  IMPRESSION: 1. Nasogastric tube is in the stomach. 2. Persistent small bowel dilatation consistent with obstruction.   Electronically Signed   By: Kerby Moors M.D.   On: 11/28/2014 09:05   Dg Abd Portable 1v  11/26/2014   CLINICAL DATA:  Nasogastric tube placement  EXAM: PORTABLE ABDOMEN - 1 VIEW  COMPARISON:  Portable exam 1318 hours compared to 11/26/2014  FINDINGS: Tip of nasogastric tube projects over proximal stomach.  Numerous surgical clips in the perigastric region.  Multiple dilated loops of small bowel again identified compatible with small bowel obstruction.  Small amount of contrast within a decompressed colon.  No definite bowel wall thickening.  Lung bases clear.   IMPRESSION: Small bowel obstruction.  Tip of nasogastric tube projects over proximal stomach.   Electronically Signed   By: Lavonia Dana M.D.   On: 11/26/2014 16:49   Dg Abd Portable 1v-small Bowel Obstruction Protocol-initial, 8 Hr Delay  11/08/2014   CLINICAL DATA:  Small bowel obstruction protocol. Contrast administered at 0345 hours.  EXAM: PORTABLE ABDOMEN - 1 VIEW  COMPARISON:  CT and radiographs 11/07/2014.  FINDINGS: 1156 hours. Nasogastric tube is visualized within the gastric fundus. There is a small amount of contrast within the stomach and distal small bowel which remains dilated. Contrast is also present within the colon, extending to the rectum, increased in density from yesterday, implying that some of the recently administered contrast has passed into the colon. There is still moderate diffuse small bowel distension. Multiple left abdominal surgical clips are noted. No evidence of free intraperitoneal air or contrast extravasation.  IMPRESSION: Persistent moderate small bowel distension in excess of the colon. However, the volume of contrast in the colon has increased from yesterday's radiographs, implying some passage of contrast through the small bowel.   Electronically Signed   By: Richardean Sale M.D.   On: 11/08/2014 12:31   Dg Abd Portable 1v-small Bowel Protocol-position Verification  11/08/2014   CLINICAL DATA:  Nasogastric tube placement  EXAM: PORTABLE ABDOMEN - 1  VIEW  COMPARISON:  CT abdomen and pelvis November 07, 2014  FINDINGS: Nasogastric tube tip and side port are in the stomach. There are loops of dilated bowel indicating a degree of obstruction. No free air apparent. There are multiple surgical clips in the mid abdomen.  IMPRESSION: Nasogastric tube tip and side port in stomach. Dilated loops of bowel consistent with a degree of obstruction.   Electronically Signed   By: Lowella Grip III M.D.   On: 11/08/2014 01:12     Medical Consultants:    Infectious Disease:  Carlyle Basques, MD  General Surgery: Alphonsa Overall, MD  Anti-Infectives:    Daptomycin  Flagyl  Subjective:   Darryl Griffith complains of some abdominal pain and nausea but no vomiting. Reports flatus and having had a bowel movement yesterday. No dyspnea.  Objective:    Filed Vitals:   12/06/14 0000 12/06/14 0234 12/06/14 0400 12/06/14 0602  BP:  136/85  144/63  Pulse:  127  117  Temp:  100.9 F (38.3 C)  100 F (37.8 C)  TempSrc:  Oral  Oral  Resp: 25 25 25 27   Height:      Weight:      SpO2: 99% 98% 99% 98%    Intake/Output Summary (Last 24 hours) at 12/06/14 0828 Last data filed at 12/06/14 0742  Gross per 24 hour  Intake 4438.3 ml  Output   3135 ml  Net 1303.3 ml   Filed Weights   11/24/14 2020  Weight: 73.936 kg (163 lb)    Exam: Gen:  NAD, NG tube in place Cardiovascular: Tachycardic, No M/R/G Respiratory:  Lungs CTAB Gastrointestinal:  Abdomen soft, NT/ND, + BS, abdominal dressings intact Extremities:  No C/E/C   Data Reviewed:    Labs: Basic Metabolic Panel:  Recent Labs Lab 11/30/14 0530 12/01/14 0453 12/03/14 1235 12/04/14 0600  NA 130* 131*  --  130*  K 4.4 4.2  --  4.0  CL 99* 97*  --  100*  CO2 26 28  --  24  GLUCOSE 172* 137*  --  130*  BUN 31* 29*  --  37*  CREATININE 1.13 1.00 1.21 1.13  CALCIUM 7.6* 8.1*  --  8.2*  MG 1.8  --   --  2.1  PHOS 3.4  --   --  4.0   GFR Estimated Creatinine Clearance: 58.8 mL/min (by C-G formula based on Cr of 1.13). Liver Function Tests:  Recent Labs Lab 11/30/14 0530 12/04/14 0600  AST 14* 16  ALT 10* 8*  ALKPHOS 40 53  BILITOT 0.3 0.6  PROT 4.4* 5.1*  ALBUMIN 1.8* 2.4*   CBC:  Recent Labs Lab 12/02/14 1140 12/03/14 0528 12/04/14 0600 12/05/14 0510 12/06/14 0500  WBC 1.6* 1.6* 1.2* 1.2* 1.9*  NEUTROABS 0.7* 0.6* 0.3* 0.2*  --   HGB 7.0* 7.4* 7.2* 6.6* 7.6*  HCT 21.0* 22.3* 21.6* 20.2* 23.4*  MCV 82.7 82.3 82.4 82.1 82.7  PLT 281 314 316 315 377   Cardiac  Enzymes:  Recent Labs Lab 12/03/14 1235  CKTOTAL 20*   CBG:  Recent Labs Lab 12/05/14 0704 12/05/14 1144 12/05/14 1821 12/06/14 0002 12/06/14 0559  GLUCAP 146* 126* 146* 135* 143*   Lipid Profile:  Recent Labs  12/04/14 0600  TRIG 91   Microbiology Recent Results (from the past 240 hour(s))  Culture, routine-abscess     Status: None   Collection Time: 11/27/14 12:22 PM  Result Value Ref Range Status   Specimen Description  Final    ABSCESS ASPIRATION OF RECURRENT LEFT RETROPERITONEAL FLUID. Nephrectomy   Special Requests Normal  Final   Gram Stain   Final    ABUNDANT WBC PRESENT,BOTH PMN AND MONONUCLEAR NO SQUAMOUS EPITHELIAL CELLS SEEN NO ORGANISMS SEEN Performed at [facility]    Culture   Final    NO GROWTH 3 DAYS Performed at [facility]    Report Status 11/30/2014 FINAL  Final  Anaerobic culture     Status: None   Collection Time: 11/27/14 12:22 PM  Result Value Ref Range Status   Specimen Description ABSCESS LEFT RETROPERITONEAL FLUID. Nephrectomy   Final   Special Requests Normal  Final   Gram Stain   Final    ABUNDANT WBC PRESENT,BOTH PMN AND MONONUCLEAR NO SQUAMOUS EPITHELIAL CELLS SEEN NO ORGANISMS SEEN Performed at [facility]    Culture   Final    NO ANAEROBES ISOLATED Performed at [facility]    Report Status 12/02/2014 FINAL  Final  Fungus culture w smear     Status: None (Preliminary result)   Collection Time: 11/27/14 12:22 PM  Result Value Ref Range Status   Specimen Description ABSCESS LEFT RETROPERITONEAL FLUID/Nephrectomy  Final   Special Requests Normal  Final   Fungal Smear   Final    NO YEAST OR FUNGAL ELEMENTS SEEN Performed at [facility]    Culture   Final    CULTURE IN PROGRESS FOR FOUR WEEKS Performed at [facility]    Report Status PENDING  Incomplete  Surgical pcr screen     Status: None   Collection Time: 11/29/14  6:02 AM  Result Value Ref Range Status    MRSA, PCR NEGATIVE NEGATIVE Final   Staphylococcus aureus NEGATIVE NEGATIVE Final    Comment:        The Xpert SA Assay (FDA approved for NASAL specimens in patients over 19 years of age), is one component of a comprehensive surveillance program.  Test performance has been validated by [facility] for patients greater than or equal to 36 year old. It is not intended to diagnose infection nor to guide or monitor treatment.   Culture, routine-abscess     Status: None   Collection Time: 11/29/14 10:23 AM  Result Value Ref Range Status   Specimen Description ABSCESS BACK  Final   Special Requests NONE  Final   Gram Stain   Final    RARE WBC PRESENT, PREDOMINANTLY PMN NO SQUAMOUS EPITHELIAL CELLS SEEN NO ORGANISMS SEEN Performed at [facility]    Culture   Final    NO GROWTH 3 DAYS Performed at [facility]    Report Status 12/02/2014 FINAL  Final  Anaerobic culture     Status: None   Collection Time: 11/29/14 10:25 AM  Result Value Ref Range Status   Specimen Description ABSCESS BACK  Final   Special Requests NONE  Final   Gram Stain   Final    RARE WBC PRESENT, PREDOMINANTLY PMN NO SQUAMOUS EPITHELIAL CELLS SEEN NO ORGANISMS SEEN Performed at [facility]    Culture   Final    NO ANAEROBES ISOLATED Performed at [facility]    Report Status 12/04/2014 FINAL  Final  Body fluid culture     Status: None   Collection Time: 11/29/14 11:42 AM  Result Value Ref Range Status   Specimen Description FLUID PERITONEAL LEFT PERITONEAL FLUID  Final   Special Requests NONE  Final  Gram Stain   Final    FEW WBC PRESENT, PREDOMINANTLY PMN NO ORGANISMS SEEN    Culture   Final    NO GROWTH 3 DAYS Performed at [facility]    Report Status 12/02/2014 FINAL  Final  Anaerobic culture     Status: None   Collection Time: 11/29/14 11:42 AM  Result Value Ref Range Status   Specimen Description FLUID PERITONEAL  Final   Special Requests NONE   Final   Gram Stain   Final    FEW WBC PRESENT, PREDOMINANTLY PMN NO ORGANISMS SEEN    Culture   Final    NO ANAEROBES ISOLATED Performed at [facility]    Report Status 12/04/2014 FINAL  Final     Medications:   . sodium chloride   Intravenous Once  . sodium chloride   Intravenous Once  . bisacodyl  10 mg Rectal Daily  . DAPTOmycin (CUBICIN)  IV  600 mg Intravenous Q24H  . heparin subcutaneous  5,000 Units Subcutaneous 3 times per day  . HYDROmorphone PCA 0.3 mg/mL   Intravenous 6 times per day  . insulin aspart  0-9 Units Subcutaneous 4 times per day  . lip balm  1 application Topical BID  . methocarbamol (ROBAXIN)  IV  500 mg Intravenous 3 times per day  . metronidazole  500 mg Intravenous Q8H  . pantoprazole (PROTONIX) IV  40 mg Intravenous Q12H   Continuous Infusions: . sodium chloride 10 mL/hr at 11/30/14 0426  . Marland KitchenTPN (CLINIMIX-E) Adult 93 mL/hr at 12/05/14 1748   And  . fat emulsion 240 mL (12/05/14 1748)    Time spent: 35 minutes.  The patient is medically complex and requires high complexity decision making and coordination of care with multiple specialists.    LOS: 12 days   [place] [facility] Pager [phone]. If unable to reach me by pager, please call my cell phone at [phone].  *Please refer to [email or web address], password [email or web address] to get updated schedule on who will round on this patient, as hospitalists switch teams weekly. If 7PM-7AM, please contact night-coverage at [email or web address], password [email or web address] for any overnight needs.  12/06/2014, 8:28 AM

## 2014-12-06 NOTE — Clinical Social Work Note (Signed)
Clinical Social Work Assessment  Patient Details  Name: Darryl Griffith MRN: [id number] Date of Birth: June 08, 1945  Date of referral:  12/06/14               Reason for consult:  Discharge Planning                Permission sought to share information with:  Other Permission granted to share information::  No  Name::        Agency::     Relationship::     Contact Information:     Housing/Transportation Living arrangements for the past 2 months:  Single Family Home Source of Information:  Patient Patient Interpreter Needed:  None Criminal Activity/Legal Involvement Pertinent to Current Situation/Hospitalization:  No - Comment as needed Significant Relationships:  Spouse Lives with:  Adult Children, Spouse Do you feel safe going back to the place where you live?  Yes Need for family participation in patient care:  No (Coment)  Care giving concerns:  PT recommending HH/SNF/24 hour care at [place].   Social Worker assessment / plan:  CSW met with patient at bedside. CSW introduced myself and explained role. Patient reports he lives at home with wife and son who has developmental delays. Patient was independent on admission but reports he is requiring +2 assist now. Patient reports he has been to SNF in the past but is unsure what he wants to do at [place]. Patient reports he will talk with wife and make decision.  CSW left SNF list and CSW contact information and will continue to follow.  Employment status:  Retired Nurse, adult PT Recommendations:  Home with [employer], [place], [place] / Referral to community resources:  [place]  Patient/Family's Response to care:  Patient reports he is upset about being in the hospital again and that he has to rely on others to ambulate.  Patient/Family's Understanding of and Emotional Response to Diagnosis, Current Treatment, and Prognosis:  Patient reports chronic health  problems. Patient is hopeful to regain his strength quickly so he does not have to rely on others.  Emotional Assessment Appearance:  Appears stated age Attitude/Demeanor/Rapport:  Angry Affect (typically observed):  Agitated Orientation:  Oriented to Self, Oriented to Place, Oriented to  Time, Oriented to Situation Alcohol / Substance use:  Not Applicable Psych involvement (Current and /or in the community):  No (Comment)  Discharge Needs  Concerns to be addressed:  No discharge needs identified Readmission within the last 30 days:  Yes Current discharge risk:  None Barriers to Discharge:  No Barriers Identified   Boone Master, [place] 12/06/2014, 2:31 PM [phone]

## 2014-12-06 NOTE — Progress Notes (Signed)
ANTIBIOTIC CONSULT NOTE - INITIAL  Pharmacy Consult for Daptomycin Indication: VRE, Retroperitoneal Abscess  Allergies  Allergen Reactions  . Compazine [Prochlorperazine] Other (See Comments)    Made pt really lethargic, and sleepy    Patient Measurements: Height: 5\' 8"  (172.7 cm) Weight: 163 lb (73.936 kg) IBW/kg (Calculated) : 68.4  Vital Signs: Temp: 100 F (37.8 C) (09/07 0602) Temp Source: Oral (09/07 0602) BP: 144/63 mmHg (09/07 0602) Pulse Rate: 117 (09/07 0602) Intake/Output from previous day: 09/06 0701 - 09/07 0700 In: 4328.3 [I.V.:20; Blood:318; IV Piggyback:255; PIR:5188.4] Out: 3135 [ZYSAY:3016; Emesis/NG output:1150; Drains:35] Intake/Output from this shift:    Labs:  Recent Labs  12/03/14 1235 12/04/14 0600 12/05/14 0510 12/06/14 0500  WBC  --  1.2* 1.2* 1.9*  HGB  --  7.2* 6.6* 7.6*  PLT  --  316 315 377  CREATININE 1.21 1.13  --   --    Estimated Creatinine Clearance: 58.8 mL/min (by C-G formula based on Cr of 1.13). No results for input(s): VANCOTROUGH, VANCOPEAK, VANCORANDOM, GENTTROUGH, GENTPEAK, GENTRANDOM, TOBRATROUGH, TOBRAPEAK, TOBRARND, AMIKACINPEAK, AMIKACINTROU, AMIKACIN in the last 72 hours.   Microbiology: Recent Results (from the past 720 hour(s))  Culture, routine-abscess     Status: None   Collection Time: 11/10/14  3:15 PM  Result Value Ref Range Status   Specimen Description ABSCESS LEFT RETROPERITONEAL FLUID  Final   Special Requests Normal  Final   Gram Stain   Final    NO WBC SEEN NO SQUAMOUS EPITHELIAL CELLS SEEN NO ORGANISMS SEEN Performed at [facility]    Culture   Final    RARE VANCOMYCIN RESISTANT ENTEROCOCCUS ISOLATED Note: CRITICAL RESULT CALLED TO, READ BACK BY AND VERIFIED WITH: Clarise Cruz RN @ [phone] ON [id number] BY [facility] REPORT FAXED BY REQUEST Performed at [facility]    Report Status 11/15/2014 FINAL  Final   Organism ID, Bacteria VANCOMYCIN RESISTANT ENTEROCOCCUS ISOLATED  Final   Susceptibility   Vancomycin resistant enterococcus isolated - MIC*    VANCOMYCIN 256 RESISTANT Resistant     AMPICILLIN >=32 RESISTANT Resistant     LINEZOLID 2 SENSITIVE Sensitive     * RARE VANCOMYCIN RESISTANT ENTEROCOCCUS ISOLATED  Anaerobic culture     Status: None   Collection Time: 11/10/14  3:15 PM  Result Value Ref Range Status   Specimen Description ABSCESS LEFT RETROPERITONEAL FLUID  Final   Special Requests Normal  Final   Gram Stain   Final    NO WBC SEEN NO SQUAMOUS EPITHELIAL CELLS SEEN NO ORGANISMS SEEN Performed at [facility]    Culture   Final    NO ANAEROBES ISOLATED Performed at [facility]    Report Status 11/16/2014 FINAL  Final  Fungus culture w smear     Status: None (Preliminary result)   Collection Time: 11/10/14  3:15 PM  Result Value Ref Range Status   Specimen Description ABSCESS LEFT RETROPERITONEAL FLUID  Final   Special Requests Normal  Final   Fungal Smear   Final    NO YEAST OR FUNGAL ELEMENTS SEEN Performed at [facility]    Culture   Final    CULTURE IN PROGRESS FOR FOUR WEEKS Performed at [facility]    Report Status PENDING  Incomplete  Culture, routine-abscess     Status: None   Collection Time: 11/27/14 12:22 PM  Result Value Ref Range Status   Specimen Description   Final    ABSCESS ASPIRATION OF RECURRENT LEFT RETROPERITONEAL  FLUID. Nephrectomy   Special Requests Normal  Final   Gram Stain   Final    ABUNDANT WBC PRESENT,BOTH PMN AND MONONUCLEAR NO SQUAMOUS EPITHELIAL CELLS SEEN NO ORGANISMS SEEN Performed at [facility]    Culture   Final    NO GROWTH 3 DAYS Performed at [facility]    Report Status 11/30/2014 FINAL  Final  Anaerobic culture     Status: None   Collection Time: 11/27/14 12:22 PM  Result Value Ref Range Status   Specimen Description ABSCESS LEFT RETROPERITONEAL FLUID. Nephrectomy   Final   Special Requests Normal  Final   Gram Stain   Final     ABUNDANT WBC PRESENT,BOTH PMN AND MONONUCLEAR NO SQUAMOUS EPITHELIAL CELLS SEEN NO ORGANISMS SEEN Performed at [facility]    Culture   Final    NO ANAEROBES ISOLATED Performed at [facility]    Report Status 12/02/2014 FINAL  Final  Fungus culture w smear     Status: None (Preliminary result)   Collection Time: 11/27/14 12:22 PM  Result Value Ref Range Status   Specimen Description ABSCESS LEFT RETROPERITONEAL FLUID/Nephrectomy  Final   Special Requests Normal  Final   Fungal Smear   Final    NO YEAST OR FUNGAL ELEMENTS SEEN Performed at [facility]    Culture   Final    CULTURE IN PROGRESS FOR FOUR WEEKS Performed at [facility]    Report Status PENDING  Incomplete  Surgical pcr screen     Status: None   Collection Time: 11/29/14  6:02 AM  Result Value Ref Range Status   MRSA, PCR NEGATIVE NEGATIVE Final   Staphylococcus aureus NEGATIVE NEGATIVE Final    Comment:        The Xpert SA Assay (FDA approved for NASAL specimens in patients over 64 years of age), is one component of a comprehensive surveillance program.  Test performance has been validated by [facility] for patients greater than or equal to 33 year old. It is not intended to diagnose infection nor to guide or monitor treatment.   Culture, routine-abscess     Status: None   Collection Time: 11/29/14 10:23 AM  Result Value Ref Range Status   Specimen Description ABSCESS BACK  Final   Special Requests NONE  Final   Gram Stain   Final    RARE WBC PRESENT, PREDOMINANTLY PMN NO SQUAMOUS EPITHELIAL CELLS SEEN NO ORGANISMS SEEN Performed at [facility]    Culture   Final    NO GROWTH 3 DAYS Performed at [facility]    Report Status 12/02/2014 FINAL  Final  Anaerobic culture     Status: None   Collection Time: 11/29/14 10:25 AM  Result Value Ref Range Status   Specimen Description ABSCESS BACK  Final   Special Requests NONE  Final   Gram Stain    Final    RARE WBC PRESENT, PREDOMINANTLY PMN NO SQUAMOUS EPITHELIAL CELLS SEEN NO ORGANISMS SEEN Performed at [facility]    Culture   Final    NO ANAEROBES ISOLATED Performed at [facility]    Report Status 12/04/2014 FINAL  Final  Body fluid culture     Status: None   Collection Time: 11/29/14 11:42 AM  Result Value Ref Range Status   Specimen Description FLUID PERITONEAL LEFT PERITONEAL FLUID  Final   Special Requests NONE  Final   Gram Stain   Final  FEW WBC PRESENT, PREDOMINANTLY PMN NO ORGANISMS SEEN    Culture   Final    NO GROWTH 3 DAYS Performed at [facility]    Report Status 12/02/2014 FINAL  Final  Anaerobic culture     Status: None   Collection Time: 11/29/14 11:42 AM  Result Value Ref Range Status   Specimen Description FLUID PERITONEAL  Final   Special Requests NONE  Final   Gram Stain   Final    FEW WBC PRESENT, PREDOMINANTLY PMN NO ORGANISMS SEEN    Culture   Final    NO ANAEROBES ISOLATED Performed at [facility]    Report Status 12/04/2014 FINAL  Final    Medical History: Past Medical History  Diagnosis Date  . Hypertension   . Anginal pain     hx of CP saw Dr Wynonia Lawman 09/20/2013 H&P per chart   . Left Retroperitoneal mass s/p resection/L nephrectomy 07/13/2014   . Postoperative anemia due to acute blood loss 07/15/2014  . SBO (small bowel obstruction) 08/01/2014, 10/11/14  . Retroperitoneal fluid collection   . 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 Impaired fasting glucose 11/25/2014  . Benign neoplasm of colon 11/25/2014  . Actinic keratosis 11/25/2014  . 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 Patient admitted on 8/26 for n/v and abdominal pain, found to  have recurrent SBO. Patient underwent repeat aspiration of fluid on 8/29. Linezolid changed to daptomycin on 9/4 d/t suspicion for drug induced leukopenia and anemia.  Today, 12/06/2014: - Tmax 100.9, wbc up slightly 1.9 - SCr 1.13 on 9/5 (CrCl ~ 55 ml/min)  PTA Linezolid, Metronidazole since 11/15/14 - changed from zyvox to dapto d/t suspicion for zyvox induced myelosuppression 8/17 >> Linezolid >> 9/4 8/17 >> Flagyl >> 9/4 >> Daptomycin Rx>>   - CK 20 on 9/04   8/12 L retroperitoneal fluid abscess: VRE (S = Linezolid) 8/29 abd fluid abscess: NGF 8/29 anaerobic abd fluid abscess: NGF 8/29 fungus abd fluid abscess: no yeast or fungal elements seen, cx in progress x 4 wks 8/31 peritoneal fluid: NGF 8/31 anaerobic peritoneal fluid: neg FINAL 8/31 back abscess: NGF 8/31 anaerobic back abscess: neg FINAL 8/31 MRSA PCR: negative  Goal of Therapy:  Appropriate antibiotic dosing for renal function and indication Eradication of infection  Plan:   Continue Daptomycin 600 mg (~8 mg/kg) IV q24h.  Monitor weekly CK levels while on Daptomycin therapy.   Continue Flagyl 500 mg IV q8h per ID recs.  Monitor renal function, cultures, clinical course.   Darryl Griffith, PharmD, BCPS 12/06/2014 7:38 AM

## 2014-12-06 NOTE — Progress Notes (Signed)
Patient ID: Elmor Kost, male   DOB: October 16, 1945, 69 y.o.   MRN: [id number] 7 Days Post-Op  Subjective: Pain still present.  Using pca  Reports OOB yesterday.  Got p RBCs yesterday.  Had some flatus and aBM yesterday.  Objective: Vital signs in last 24 hours: Temp:  [98.1 F (36.7 C)-100.9 F (38.3 C)] 100 F (37.8 C) (09/07 0602) Pulse Rate:  [103-134] 117 (09/07 0602) Resp:  [22-27] 27 (09/07 0602) BP: (136-160)/(58-85) 144/63 mmHg (09/07 0602) SpO2:  [95 %-99 %] 98 % (09/07 0602) Last BM Date: 12/06/14  Intake/Output from previous day: 09/06 0701 - 09/07 0700 In: 4328.3 [I.V.:20; Blood:318; IV Piggyback:255; TPN:3615.3] Out: 4765 [Urine:1950; Emesis/NG output:1150; Drains:35] Intake/Output this shift: Total I/O In: 120 [Other:120] Out: -   General appearance: alert, cooperative and fatigued GI: normal findings: soft, non-tender Neurologic: Flat affect Incision/Wound: Dressing clean and dry.    Lab Results:   Recent Labs  12/05/14 0510 12/06/14 0500  WBC 1.2* 1.9*  HGB 6.6* 7.6*  HCT 20.2* 23.4*  PLT 315 377   BMET  Recent Labs  12/03/14 1235 12/04/14 0600  NA  --  130*  K  --  4.0  CL  --  100*  CO2  --  24  GLUCOSE  --  130*  BUN  --  37*  CREATININE 1.21 1.13  CALCIUM  --  8.2*     Studies/Results: No results found.  Anti-infectives: Anti-infectives    Start     Dose/Rate Route Frequency Ordered Stop   12/03/14 1300  DAPTOmycin (CUBICIN) 600 mg in sodium chloride 0.9 % IVPB     600 mg 224 mL/hr over 30 Minutes Intravenous Every 24 hours 12/03/14 1157     11/29/14 0830  [MAR Hold]  ceFAZolin (ANCEF) IVPB 2 g/50 mL premix     (MAR Hold since 11/29/14 0825)   2 g 100 mL/hr over 30 Minutes Intravenous On call to O.R. 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  Continue TNA.  NGT back to suction if nausea/vomiting develops.     LOS: 12 days    Winter Jocelyn 12/06/2014

## 2014-12-06 NOTE — Progress Notes (Signed)
PARENTERAL NUTRITION CONSULT NOTE - Follow-Up  Pharmacy Consult for TPN Indication: SBO  Allergies  Allergen Reactions  . Compazine [Prochlorperazine] Other (See Comments)    Made pt really lethargic, and sleepy   Patient Measurements: Height: 5' 8"  (172.7 cm) Weight: 163 lb (73.936 kg) IBW/kg (Calculated) : 68.4  Vital Signs: Temp: 100 F (37.8 C) (09/07 0602) Temp Source: Oral (09/07 0602) BP: 144/63 mmHg (09/07 0602) Pulse Rate: 117 (09/07 0602) Intake/Output from previous day: 09/06 0701 - 09/07 0700 In: 4328.3 [I.V.:20; Blood:318; IV Piggyback:255; OEV:0350.0] Out: 3135 [XFGHW:2993; Emesis/NG output:1150; Drains:35] Intake/Output from this shift:    Labs:  Recent Labs  12/04/14 0600 12/05/14 0510 12/06/14 0500  WBC 1.2* 1.2* 1.9*  HGB 7.2* 6.6* 7.6*  HCT 21.6* 20.2* 23.4*  PLT 316 315 377    Recent Labs  12/03/14 1235 12/04/14 0600  NA  --  130*  K  --  4.0  CL  --  100*  CO2  --  24  GLUCOSE  --  130*  BUN  --  37*  CREATININE 1.21 1.13  CALCIUM  --  8.2*  MG  --  2.1  PHOS  --  4.0  PROT  --  5.1*  ALBUMIN  --  2.4*  AST  --  16  ALT  --  8*  ALKPHOS  --  53  BILITOT  --  0.6  PREALBUMIN  --  6.1*  TRIG  --  91   Estimated Creatinine Clearance: 58.8 mL/min (by C-G formula based on Cr of 1.13).    Recent Labs  12/05/14 1821 12/06/14 0002 12/06/14 0559  GLUCAP 146* 135* 143*    Medical History: Past Medical History  Diagnosis Date  . Hypertension   . Anginal pain     hx of CP saw Dr Wynonia Lawman 09/20/2013 H&P per chart   . Left Retroperitoneal mass s/p resection/L nephrectomy 07/13/2014   . Postoperative anemia due to acute blood loss 07/15/2014  . SBO (small bowel obstruction) 08/01/2014, 10/11/14  . Retroperitoneal fluid collection   . 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 Impaired fasting glucose 11/25/2014  . Benign neoplasm of colon 11/25/2014  .  Actinic keratosis 11/25/2014  . Mixed hyperlipidemia 11/25/2014    Insulin Requirements: 5 units over the past 24 hours (on sensitive SSI q6h) - no hx DM  Current Nutrition: NPO - NG tube   IVF: NS at [facility] access: PICC placed 10/12/14 TPN start date: 8/28  ASSESSMENT                                                                                                          HPI:  59 yoM s/p resection L retroperitoneal sarcoma, L nephrectomy 07/13/14 developed VRE abscess. Hx abd bloating, N/V 2 days PTA, SBO seen on CT for Ca restaging.  Hx recurrent SBO. Begin TPN per Pharmacy  Significant events:   8/29: plan for exp laparotomy on 8/31; s/p aspiration of L retroperitoneal  fluid collection on 8/29 8/31:  s/p exp lap -->  I&D L back abscess, lysis of adhesions, small bowel resection, placement og drain into L retroperitoneal abscess 9/2: bloody NGT output, PPI IV BID added;  change clinimix E 5/15 to E 5/20 9/7: plan to clamp NGT today  Today:   Glucose (goal <150): all readings < 150   Electrolytes - Na low at 130;  K, mag, phos WNL (on 9/5)  Renal - WNL   LFTs - AST/ALT low, Alk Phos, Tbili WNL (on 9/5)  TGs - 91 (9/5)  Prealbumin - 6.1 (9/5)  NUTRITIONAL GOALS                                                                                             RD recs:   Kcal: 2300-2500  Protein: 110-120 grams  Fluid: 2.2-2.5 L/day  Clinimix E 5/20 at a goal rate of 93 ml/hr + 20% fat emulsion at 10 ml/hr to provide: 112 g/day protein, 2444 Kcal/day (per dietician recommendations)   PLAN                                                                                                                         At 1800 today:  Continue Clinimix E 5/20 at goal rate of 93 ml/hr.  20% fat emulsion at 10 ml/hr.  TPN to contain standard multivitamins and trace elements.  Continue IVF at [facility].  Continue sensitive SSI q6h.  TPN lab panels on Mondays & Thursdays.  F/u  daily.   Dia Sitter, PharmD, BCPS 12/06/2014 7:41 AM

## 2014-12-06 NOTE — Progress Notes (Signed)
Nutrition Follow-up  DOCUMENTATION CODES:   Non-severe (moderate) malnutrition in context of chronic illness  INTERVENTION:  - Continue TPN per pharmacy - Advance to CLD as medically feasible - RD will continue to monitor for needs  NUTRITION DIAGNOSIS:   Inadequate oral intake related to inability to eat as evidenced by NPO status. -ongoing  GOAL:   Patient will meet greater than or equal to 90% of their needs -met with TPN  MONITOR:   Diet advancement, Weight trends, Labs, I & O's  ASSESSMENT:   69 y.o. male with a history of Left Flank Area Liposarcoma S/P Resection and Left Nephrectomy 06/2014 with Radiation Rx completed on 42/8768 complicated by abscess formation (+VRE) who presents to the ED with complaints of ABD bloating x 2 days and nausea and vomiting x 4 today. He denies any hematemesis, fevers or chills. A Ct scan was performed [age] y.o. male with a history of Left Flank Area Liposarcoma S/P Resection and Left Nephrectomy 06/2014 with Radiation Rx completed on 01/5725 complicated by abscess formation (+VRE) who presents to the ED with complaints of ABD bloating x 2 days and nausea and vomiting x 4 today. He denies any hematemesis, fevers or chills. A Ct scan was performed 1 day ago for cancer staging and revealed a high grade partial small bowl obstruction and General surgery was consulted and an NGT was placed. He was referred for medical admission. He is on oral Linezolid and Metronidazole for +VRE For a chronic intra-abdominal abscess. y ago for cancer staging and revealed a high grade partial small bowl obstruction and General surgery was consulted and an NGT was placed. He was referred for medical admission. He is on oral Linezolid and Metronidazole for +VRE For a chronic intra-abdominal abscess.   9/7 POD #7. Pt continues with NGT with plan, per surgery, to trial clamping today and allow ice chips. Will monitor for tolerance of this and ability to  advance diet. Pt has been having flatus and had BM today.  TPN was adjusted at 1800 on 9/2 and that regimen continues: Clinimix E 5/20 @ 93 with 20% lipids @ 10 mL/hr. This regimen is providing 2444 kcal, 112 grams protein which is meeting needs.  Per pharmacy note this AM: At 1800 today:  Continue Clinimix E 5/20 at goal rate of 93 ml/hr.  20% fat emulsion at 10 ml/hr.  TPN to contain standard multivitamins and trace elements.  Continue [facility] at [facility].  Continue sensitive SSI q6h.  TPN lab panels on Mondays & Thursdays.  Medications reviewed. CBGs: 117-158 mg/dL and no BMP available.     9/2 - POD #2. Pt continues to be NPO. NGT remains in place and surgery notes plan to add Protonix BID due to bloody drainage.  - Pt continues with Clinimix E 5/15 @ 100 mL/hr with 20% lipids @ 10 mL/hr which is providing 120 grams protein, 2184 kcal (100% protein needs and 95% minimum estimated kcal needs). - No pharmacy note yet today, but per note yesterday AM: 1) magnesium sulfate 1gm IV x1 2) At 1800 today:  continue Clinimix E 5/15 rate to 100 ml/hr (goal rate). Once CBGs improve, will transition over to Clinimix E 5/20  20% fat emulsion at 10 ml/hr.  TPN to contain standard multivitamins and trace elements.  Reduce IVF to [facility].  Continue sensitive SSI q6h  TPN lab panels on Mondays & Thursdays.  9/1 - POD #1 I&D L back abscess, ex lap, LOA, small bowel resection, and placement  of drain to L retroperitoneal abscess.  - NGT remains in place and ~375cc light brown-colored drainage present in canister at time of RD visit.  - He is currently receiving Clinimix E 5/15 @ 100 mL/hr with 20% lipids @ 10 mL/hr which is providing 120 grams protein, 2184 kcal (100% protein needs and 95% minimum estimated kcal needs). - Per pharmacy note yesterday: If patient tolerates clinimix E 5/15 at goal rate of 100 ml/hr and CBGs are within goal range, then will change to clinimix E 5/20 at goal rate of  90 ml/hr to obtain goal protein 108 g/day and total Kcal 2380. This would provide 98% minimum estimated protein needs and 100% kcal needs. - It is preferential to meet 100% protein needs before meeting 100% kcal needs due to extensive surgery yesterday (8/31).   8/29 - Pt with NGT placed 8/26 which was clamped at time of RD visit with ~500cc drainage present in canister.  - Pt denies nausea, vomiting, or abdominal pain this AM.  - He states that since d/c earlier this month he has been eating and drinking without difficulty until about 3-4 days PTA. He states during this time he began having nausea and vomiting with all PO intakes and that last episode of emesis was the day of or day after admission. - Per chart review, pt has lost 11 lbs (6% body weight) in the past 1 month which is significant for time frame. Mild muscle and fat wasting also noted. - Order currently in place for Clinimix E 5/15 @ 40 mL/hr with 20% lipids @ 10 mL/hr which provides 48 grams protein, 1162 kcal and was started yesterday at 1800. - Per pharmacy note yesterday, plan for goal of Clinimix E 5/15 with 20% lipids @ 10 mL/hr which will provide 120 grams protein, 2184 kcal which will meet protein needs and 95% kcal needs. - Clinimix E 5/20 @ 93 mL/hr with 20% lipids @ 10 mL/hr would better meet needs as it would provide 112 grams protein, 2444 kcal.   Diet Order:  TPN (CLINIMIX-E) Adult TPN (CLINIMIX-E) Adult  Skin:  Wound (see comment) (L back and abdominal surgical incisions)  Last BM:  9/7  Height:   Ht Readings from Last 1 Encounters:  11/24/14 5' 8"  (1.727 m)    Weight:   Wt Readings from Last 1 Encounters:  11/24/14 163 lb (73.936 kg)    Ideal Body Weight:  70 kg (kg)  BMI:  Body mass index is 24.79 kg/(m^2).  Estimated Nutritional Needs:   Kcal:  2493-2419  Protein:  110-120 grams  Fluid:  2.2-2.5 L/day  EDUCATION NEEDS:   No education needs identified at this time     Jarome Matin, RD, LDN Inpatient Clinical Dietitian Pager # [phone] After hours/weekend pager # [phone]

## 2014-12-06 NOTE — Care Management Important Message (Signed)
Important Message  Patient Details  Name: Darryl Griffith MRN: [id number] Date of Birth: Sep 08, 1945   Medicare Important Message Given:  Yes-fourth notification given    Camillo Flaming 12/06/2014, 2:17 [place] Message  Patient Details  Name: Darryl Griffith MRN: [id number] Date of Birth: 08/02/1945   Medicare Important Message Given:  Yes-fourth notification given    Camillo Flaming 12/06/2014, 2:17 PM

## 2014-12-07 ENCOUNTER — Ambulatory Visit: Payer: Medicare Other | Admitting: Internal Medicine

## 2014-12-07 LAB — GLUCOSE, CAPILLARY
GLUCOSE-CAPILLARY: 140 mg/dL — AB (ref 65–99)
Glucose-Capillary: 148 mg/dL — ABNORMAL HIGH (ref 65–99)
Glucose-Capillary: 156 mg/dL — ABNORMAL HIGH (ref 65–99)
Glucose-Capillary: 168 mg/dL — ABNORMAL HIGH (ref 65–99)

## 2014-12-07 LAB — COMPREHENSIVE METABOLIC PANEL WITH GFR
ALT: 9 U/L — ABNORMAL LOW (ref 17–63)
AST: 16 U/L (ref 15–41)
Albumin: 2.1 g/dL — ABNORMAL LOW (ref 3.5–5.0)
Alkaline Phosphatase: 74 U/L (ref 38–126)
Anion gap: 8 (ref 5–15)
BUN: 37 mg/dL — ABNORMAL HIGH (ref 6–20)
CO2: 20 mmol/L — ABNORMAL LOW (ref 22–32)
Calcium: 7.8 mg/dL — ABNORMAL LOW (ref 8.9–10.3)
Chloride: 103 mmol/L (ref 101–111)
Creatinine, Ser: 1.12 mg/dL (ref 0.61–1.24)
GFR calc Af Amer: >60 mL/min
GFR calc non Af Amer: >60 mL/min
Glucose, Bld: 148 mg/dL — ABNORMAL HIGH (ref 65–99)
Potassium: 4.5 mmol/L (ref 3.5–5.1)
Sodium: 131 mmol/L — ABNORMAL LOW (ref 135–145)
Total Bilirubin: 0.6 mg/dL (ref 0.3–1.2)
Total Protein: 5 g/dL — ABNORMAL LOW (ref 6.5–8.1)

## 2014-12-07 LAB — MAGNESIUM: Magnesium: 1.9 mg/dL (ref 1.7–2.4)

## 2014-12-07 LAB — PHOSPHORUS: Phosphorus: 3.3 mg/dL (ref 2.5–4.6)

## 2014-12-07 MED ORDER — TRACE MINERALS CR-CU-MN-SE-ZN 10-1000-500-60 MCG/ML IV SOLN
INTRAVENOUS | Status: AC
  Administered 2014-12-07: 17:00:00 via INTRAVENOUS
  Filled 2014-12-07: qty 2232

## 2014-12-07 MED ORDER — FAT EMULSION 20 % IV EMUL
240.0000 mL | INTRAVENOUS | Status: AC
  Administered 2014-12-07: 240 mL via INTRAVENOUS
  Filled 2014-12-07: qty 250

## 2014-12-07 NOTE — Progress Notes (Signed)
Patient ID: Darryl Griffith, male   DOB: Mar 09, 1946, 69 y.o.   MRN: [id number] 8 Days Post-Op   Subjective: Sleepy.  NGT clamped yesterday with minimal residual and no n/v.  He did not receive any antiemetics yesterday.  Denies additional flatus.   Objective: Vital signs in last 24 hours: Temp:  [97.7 F (36.5 C)-99.6 F (37.6 C)] 97.7 F (36.5 C) (09/08 0620) Pulse Rate:  [110-126] 110 (09/08 0620) Resp:  [24-29] 24 (09/08 0620) BP: (134-164)/(61-79) 162/67 mmHg (09/08 0620) SpO2:  [97 %-99 %] 98 % (09/08 0620) Last BM Date: 12/06/14  Intake/Output from previous day: 09/07 0701 - 09/08 0700 In: 3616.5 [I.V.:477.5; NG/GT:245; IV Piggyback:165; TPN:2489] Out: 1920 [Urine:1750; Emesis/NG output:150; Drains:20] Intake/Output this shift:    General appearance: alert, cooperative and fatigued GI: normal findings: soft, non-tender Incision/Wound: Staples in place.   Dressing clean and dry.    Lab Results:   Recent Labs  12/05/14 0510 12/06/14 0500  WBC 1.2* 1.9*  HGB 6.6* 7.6*  HCT 20.2* 23.4*  PLT 315 377   BMET  Recent Labs  12/07/14 0512  NA 131*  K 4.5  CL 103  CO2 20*  GLUCOSE 148*  BUN 37*  CREATININE 1.12  CALCIUM 7.8*     Studies/Results: No results found.  Anti-infectives: Anti-infectives    Start     Dose/Rate Route Frequency Ordered Stop   12/03/14 1300  DAPTOmycin (CUBICIN) 600 mg in sodium chloride 0.9 % IVPB     600 mg 224 mL/hr over 30 Minutes Intravenous Every 24 hours 12/03/14 1157     11/29/14 0830  [MAR Hold]  ceFAZolin (ANCEF) IVPB 2 g/50 mL premix     (MAR Hold since 11/29/14 0825)   2 g 100 mL/hr over 30 Minutes Intravenous On call to O.R. 11/28/14 0850 11/29/14 0908   11/25/14 0000  metroNIDAZOLE (FLAGYL) IVPB 500 mg     500 mg 100 mL/hr over 60 Minutes Intravenous Every 8 hours 11/24/14 2355     11/25/14 0000  linezolid (ZYVOX) IVPB 600 mg  Status:  Discontinued     600 mg 300 mL/hr over 60 Minutes Intravenous Every 12 hours  11/24/14 2355 12/03/14 1145      Assessment/Plan: left retroperitoneal liposarcoma s/p resection and left nephrectomy 07/13/14 s/p XRT Post op intra-abdominal abscesses(VRE) Recurrent SBO  Status post ex lap/lysis of adhesions, small bowel resection, drainage back abscess - 11/29/2014  Improving bowel function. Pt needs to mobilize more, but is refusing.   Will start clears.  Pt does not want NGT removed until he "knows" he is tolerating liquids.     LOS: 13 days    Tekeyah Santiago 12/07/2014

## 2014-12-07 NOTE — Progress Notes (Signed)
Progress Note   Darryl Griffith FYB:[id number] DOB: 01-12-1946 DOA: 11/24/2014 PCP: Gennette Pac, MD   Brief Narrative:   Darryl Griffith is an 69 y.o. 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 General surgery was consulted on admission. Patient did not improve with conservative measures and underwent surgery 11/29/14: incision and drainage of left back abscess, exploratory laparotomy, lysis of adhesions, small bowel resection and placement of drain into left retroperitoneal abscess.   Assessment/Plan:   Principal problem:  SBO - Gen. surgery following, appreciate input. Continue NPO, NG tube, TNA. -Underwent surgical repair on 8/31 with exploratory laparotomy, lysis of adhesions, small bowel resection, placement of drain into left retroperitoneal abscess.  - CL diet started by surgery.  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 Follow-up oncology post discharge.  Left retroperitoneal and left upper quadrant fluid collection  - Cultures initially positive for VRE. Most recent cultures from 11/29/14 were negative. - Status post IR drainage 11/27/14. - ID following. Has been on antibiotics for 56 days, day #20 Flagyl and day #4 daptomycin.  HTN - Continue hydralazine as needed.  Hypokalemia - Resolved.  Hypomagnesemia - Resolved.  Anemia - Monitoring blood counts. Has required blood transfusion this hospital stay.  Hyponatremia - Monitor sodium, improved.   Neutropenia - Felt to be related to treatment side Vioxx. Changed to cubicin after speaking with Dr Tommy Medal. Discussed with pharmacy.    Tachycardia - Multi-factorial with infection, anemia contributory.  DVT prophylaxis - SCDs.  Code Status: Full Code Family Communication: No family at bedside.  Disposition Plan: Unclear at present, remains on TNA. Possibly will need SNF when medically stable.  IV Access:    PICC line placed 10/12/14   Procedures and diagnostic studies:   Ct Chest W Contrast  11/23/2014   CLINICAL DATA:  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  CONTRAST:  131mL OMNIPAQUE IOHEXOL 300 MG/ML  SOLN  COMPARISON:  11/07/2014  FINDINGS: CT CHEST FINDINGS  Thoracic inlet: No neck base or axillary masses or adenopathy. Normal thyroid.  Mediastinum and hila: Heart normal in size and configuration. Mild prominence of the ascending aorta measuring 4 cm in diameter. 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 Right PICC tip projects in the lower superior vena cava. No mediastinal or hilar masses or pathologically enlarged lymph nodes.  Lungs and pleura: No lung masses or suspicious nodules. Small left pleural effusion associated with mild dependent left lower lobe atelectasis. Small focus of reticular nodular opacity in the posterior right lower lobe is likely additional atelectasis or scarring. There is no lung consolidation or edema. No pneumothorax.  CT ABDOMEN AND PELVIS FINDINGS  There is a fluid collection that extends from the posterior medial left superior retroperitoneum, adjacent to the spleen, to the lateral margin of the  superior left psoas muscle.  This measures 4.9 x 2.2 cm transversely, previously 5.6 x 2.6 cm. It measures 7.6 cm an approximate superior inferior length, previously 9.2 cm. Fluid attenuation and multiple blastic and clips lie in the left upper quadrant between the posterior wall of the stomach and upper abdominal aorta. There is thickening of this portion of the stomach wall. These changes partly surround the pancreatic tail and surround the left diaphragmatic crus. No left adrenal gland is visible. Left kidney is surgically absent.  Liver and spleen:  Unremarkable.  Gallbladder and biliary tree: Unremarkable.  Pancreas: There is some heterogeneous lower attenuation along the pancreatic tail adjacent to the left upper quadrant surgical changes and low-attenuation soft tissue. This is similar to the prior exam, likely reactive edema. No other pancreatic abnormality.  Right adrenal glands: Normal.  Right kidney, ureter, bladder: 15 mm midpole cyst. No other right renal masses, no stones and no hydronephrosis. Normal ureters. Unremarkable bladder.  Lymph nodes:  No pathologically enlarged lymph nodes.  Ascites:  Trace ascites in the pelvis.  Gastrointestinal: There is a partial small bowel obstruction. The transition point is in the left upper adjacent to the postsurgical changes and residual left retroperitoneal fluid collection. Small bowel proximal to the transition is dilated to a maximum of 5.3 cm. Small bowel distal to this is decompressed. The colon is partly decompressed. There are scattered colonic diverticula mostly along the sigmoid colon. No diverticulitis. Normal-sized appendix is visualized. No bowel wall thickening or inflammatory changes. No free air.  Abdominal wall: There is edema in the subcutaneous fat mostly along the flanks. There is midline abdominal incision. No hernias.  Vascular: Atherosclerotic changes are noted along the infrarenal abdominal aorta and branch vessels. No aneurysm.  MUSCULOSKELETAL FINDINGS  No  osteoblastic or osteolytic lesions. Degenerative changes noted of the visualized spine. Bones are demineralized.  IMPRESSION: 1. Residual left retroperitoneal and left upper quadrant collection associated with postsurgical changes. The defined residual collection, along the posterior medial left retroperitoneum abutting the spleen and left psoas muscle, is smaller, now measuring 4.9 x 2.3 x 7.6 cm, previously 5.6 x 2.6 x 9.2 cm. Left upper quadrant postsurgical and inflammatory changes include thickening of the posterior wall of the stomach, stable. There are numerous vascular clips in the left retroperitoneum and left posterior medial upper quadrant. 2. Fairly high-grade partial small bowel obstruction with the transition point in the posterior medial left upper quadrant adjacent to the residual collection and postsurgical changes. This is similar to prior study. 3. No convincing local residual retroperitoneal sarcoma. 4. No evidence of metastatic disease from the sarcoma.   Electronically Signed   By: Lajean Manes M.D.   On: 11/23/2014 14:36   Ct Abdomen Pelvis W Contrast  11/23/2014   CLINICAL DATA:  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  CONTRAST:  154mL OMNIPAQUE IOHEXOL 300 MG/ML  SOLN  COMPARISON:  11/07/2014  FINDINGS: CT CHEST FINDINGS  Thoracic inlet: No neck base or axillary masses or adenopathy. Normal thyroid.  Mediastinum and hila: Heart normal in size and configuration. Mild prominence of the ascending aorta measuring 4 cm in diameter. There is an aberrant right subclavian artery extending posterior  to the trachea and  esophagus originating distal to the left subclavian. Right PICC tip projects in the lower superior vena cava. No mediastinal or hilar masses or pathologically enlarged lymph nodes.  Lungs and pleura: No lung masses or suspicious nodules. Small left pleural effusion associated with mild dependent left lower lobe atelectasis. Small focus of reticular nodular opacity in the posterior right lower lobe is likely additional atelectasis or scarring. There is no lung consolidation or edema. No pneumothorax.  CT ABDOMEN AND PELVIS FINDINGS  There is a fluid collection that extends from the posterior medial left superior retroperitoneum, adjacent to the spleen, to the lateral margin of the superior left psoas muscle. This measures 4.9 x 2.2 cm transversely, previously 5.6 x 2.6 cm. It measures 7.6 cm an approximate superior inferior length, previously 9.2 cm. Fluid attenuation and multiple blastic and clips lie in the left upper quadrant between the posterior wall of the stomach and upper abdominal aorta. There is thickening of this portion of the stomach wall. These changes partly surround the pancreatic tail and surround the left diaphragmatic crus. No left adrenal gland is visible. Left kidney is surgically absent.  Liver and spleen:  Unremarkable.  Gallbladder and biliary tree: Unremarkable.  Pancreas: There is some heterogeneous lower attenuation along the pancreatic tail adjacent to the left upper quadrant surgical changes and low-attenuation soft tissue. This is similar to the prior exam, likely reactive edema. No other pancreatic abnormality.  Right adrenal glands: Normal.  Right kidney, ureter, bladder: 15 mm midpole cyst. No other right renal masses, no stones and no hydronephrosis. Normal ureters. Unremarkable bladder.  Lymph nodes:  No pathologically enlarged lymph nodes.  Ascites:  Trace ascites in the pelvis.  Gastrointestinal: There is a partial small bowel obstruction. The transition point is in the left upper  adjacent to the postsurgical changes and residual left retroperitoneal fluid collection. Small bowel proximal to the transition is dilated to a maximum of 5.3 cm. Small bowel distal to this is decompressed. The colon is partly decompressed. There are scattered colonic diverticula mostly along the sigmoid colon. No diverticulitis. Normal-sized appendix is visualized. No bowel wall thickening or inflammatory changes. No free air.  Abdominal wall: There is edema in the subcutaneous fat mostly along the flanks. There is midline abdominal incision. No hernias.  Vascular: Atherosclerotic changes are noted along the infrarenal abdominal aorta and branch vessels. No aneurysm.  MUSCULOSKELETAL FINDINGS  No osteoblastic or osteolytic lesions. Degenerative changes noted of the visualized spine. Bones are demineralized.  IMPRESSION: 1. Residual left retroperitoneal and left upper quadrant collection associated with postsurgical changes. The defined residual collection, along the posterior medial left retroperitoneum abutting the spleen and left psoas muscle, is smaller, now measuring 4.9 x 2.3 x 7.6 cm, previously 5.6 x 2.6 x 9.2 cm. Left upper quadrant postsurgical and inflammatory changes include thickening of the posterior wall of the stomach, stable. There are numerous vascular clips in the left retroperitoneum and left posterior medial upper quadrant. 2. Fairly high-grade partial small bowel obstruction with the transition point in the posterior medial left upper quadrant adjacent to the residual collection and postsurgical changes. This is similar to prior study. 3. No convincing local residual retroperitoneal sarcoma. 4. No evidence of metastatic disease from the sarcoma.   Electronically Signed   By: Lajean Manes M.D.   On: 11/23/2014 14:36   Ct Abdomen Pelvis W Contrast  11/07/2014   CLINICAL DATA:  Acute epigastric abdominal pain, vomiting.  EXAM: CT ABDOMEN AND PELVIS  WITH CONTRAST  TECHNIQUE: Multidetector CT  imaging of the abdomen and pelvis was performed using the standard protocol following bolus administration of intravenous contrast.  CONTRAST:  21mL OMNIPAQUE IOHEXOL 300 MG/ML SOLN, 146mL OMNIPAQUE IOHEXOL 300 MG/ML SOLN  COMPARISON:  CT scan of October 31, 2014.  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 Minimal left posterior basilar subsegmental atelectasis is noted.  No gallstones are noted. The liver and pancreas appear normal. Spleen is unchanged. Left retroperitoneal drainage catheter noted on prior exam has been removed. The fluid collection appears to have significantly enlarged compared to prior exam, now measuring 5.6 x 2.6 cm. This is consistent with worsening abscess. Surgical clips are noted in the left retroperitoneal region which are unchanged compared to prior exam. Status post left nephrectomy. Right adrenal gland and kidney appear normal. Left adrenal gland is not clearly visualized. Stable 3.3 x 2.7 cm lesser sac fluid collection is noted. 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 There is significantly increased small bowel dilatation with transition zone seen in the left upper quadrant in area of previously described surgery, best seen on image number 37 of series 2. More distal small bowel is normal in caliber. Sigmoid diverticulosis is noted without inflammation. Urinary bladder appears normal. No significant adenopathy is noted.  IMPRESSION: Extensive postsurgical changes are noted in left upper quadrant consistent with prior left nephrectomy. There is significantly increased proximal small bowel dilatation with transition zone seen in the left upper quadrant adjacent to postoperative changes, consistent with small bowel obstruction, most likely due to adhesion or scarring.  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  Left retroperitoneal drainage catheter noted on prior exam has been  removed in the interval. Significantly increased fluid collection containing gas is seen in this area consistent with abscess.  Stable abscess or fluid collection is seen in the lesser sac.   Electronically Signed   By: Marijo Conception, M.D.   On: 11/07/2014 19:37   Ct Aspiration  11/28/2014   CLINICAL DATA:  Recurrent left retroperitoneal fluid collection after previous resection of sarcoma. The patient now presents with a small bowel obstruction. Aspiration of the fluid collection has been requested prior to planned surgery to determine if there is any evidence of infection. This fluid collection was last aspirated on 11/10/2014 revealing vancomycin resistant Enterococcus and also previously contained a percutaneous drain for several months.  EXAM: CT GUIDED CATHETER DRAINAGE OF LEFT RETROPERITONEAL FLUID COLLECTION  ANESTHESIA/SEDATION: 1.5  Mg IV Versed; 75 mcg IV Fentanyl  Total Moderate Sedation Time: 15 minutes  PROCEDURE: The procedure risks, benefits, and alternatives were explained to the patient. Questions regarding the procedure were encouraged and answered. The patient understands and consents to the procedure. A time-out was performed prior to the procedure.  The left flank region was prepped with Betadine in a sterile fashion, and a sterile drape was applied covering the operative field. A sterile gown and sterile gloves were used for the procedure. Local anesthesia was provided with 1% Lidocaine.  Under CT guidance, a 5 [place] Yueh centesis catheter was advanced into the left retroperitoneal space. Fluid aspiration was performed through the 5 French catheter. The catheter was then removed. Additional CT was performed.  COMPLICATIONS: None  FINDINGS: Recurrent fluid collection in the medial left retroperitoneal space again identified, identical in appearance to scanning just prior to the previous aspiration procedure on 08/12. Approximately 10 mL of pink colored, turbid fluid was  able to be  aspirated. Material was sent for culture analysis. Postprocedural CT shows decrease in size of the fluid collection.  IMPRESSION: CT-guided aspiration of recurrent medial left retroperitoneal fluid collection yielding 10 mL of pink colored, turbid fluid. Fluid was sent for culture analysis.   Electronically Signed   By: Aletta Edouard M.D.   On: 11/28/2014 08:21   Ct Aspiration  11/10/2014   CLINICAL DATA:  History of recurrent left retroperitoneal fluid collection status post surgical excision of a retroperitoneal liposarcoma. Most recently, a drainage catheter was placed on 08/01/2014 and remained in place for 3 months with removal of the drain on 10/31/2014. Imaging on 11/07/2014 for abdominal pain demonstrated some reaccumulation of fluid in the previously drained medial left retroperitoneal space. Aspiration under CT guidance is now performed to determine if the fluid is infected.  EXAM: CT GUIDED ASPIRATION OF LEFT RETROPERITONEAL FLUID COLLECTION  ANESTHESIA/SEDATION: 3.0 Mg IV Versed 75 mcg IV Fentanyl  Total Moderate Sedation Time:  9 minutes.  PROCEDURE: The procedure, risks, benefits, and alternatives were explained to the patient. Questions regarding the procedure were encouraged and answered. The patient understands and consents to the procedure. A time-out was performed prior to the procedure.  The left flank region was prepped with Betadine in a sterile fashion, and a sterile drape was applied covering the operative field. A sterile gown and sterile gloves were used for the procedure. Local anesthesia was provided with 1% Lidocaine.  CT was performed in a prone position. Under CT guidance, a 5 [place] Yueh centesis catheter was advanced over a 19 gauge needle into the left retroperitoneal fluid collection. Aspiration was performed through the [facility] catheter. The aspirated fluid sample was sent for culture analysis including a aerobic culture, anaerobic culture and fungal culture. Additional saline  lavage was performed through the catheter with 10 mL of sterile saline. CT was performed after catheter removal.  COMPLICATIONS: None  FINDINGS: Aspiration yielded turbid yellow fluid. A total of 20 mL of fluid was able to be aspirated from the collection. After aspiration and additional saline lavage, fluid cavity size was smaller with a small amount of fluid remaining.  IMPRESSION: CT-guided aspiration and drainage of recurrent left retroperitoneal fluid collection. A 20 mL volume of turbid yellow fluid was able to be removed and was sent for culture analysis. Given chronicity of the collection and the presence of a drain in the collection for the last 3 months, aspiration only was performed to determine if the collection is infected prior to committing the patient to potential additional long-term catheter drainage or surgery.   Electronically Signed   By: Aletta Edouard M.D.   On: 11/10/2014 17:16   Ir Venipuncture 23yrs/older By Md  12/01/2014   CLINICAL DATA:  Anemia, difficult access, poor peripheral veins, access for blood transfusion  EXAM: ULTRASOUND GUIDANCE FOR VASCULAR ACCESS  PERIPHERAL IV START BY PHYSICIAN.  Date:  9/2/20169/05/2014 5:13 pm  Radiologist:  M. Daryll Brod, MD  Guidance:  Ultrasound  FLUOROSCOPY TIME:  None.  MEDICATIONS AND MEDICAL HISTORY: None.  ANESTHESIA/SEDATION: None.  CONTRAST:  None.  COMPLICATIONS: None immediate  PROCEDURE: Informed consent was obtained from the patient following explanation of the procedure, risks, benefits and alternatives. The patient understands, agrees and consents for the procedure. All questions were addressed. A time out was performed.  Maximal barrier sterile technique utilized including caps, mask, sterile gowns, sterile gloves, large sterile drape, hand hygiene, and ChloraPrep  Preliminary ultrasound performed. Left basilic vein demonstrated. Under  sterile conditions and local anesthesia, a 16 gauge Angiocath was advanced into the left basilic  vein under ultrasound. Position confirmed with ultrasound. Images obtained for documentation. Blood aspirated easily followed by saline flush. This was secured externally with a sterile dressing. Ready for use.  IMPRESSION: Successful ultrasound left upper extremity basilic vein peripheral IV start.   Electronically Signed   By: [name] M.D.   On: 12/01/2014 17:19   Ir US Guide Vasc Access Left  12/01/2014   CLINICAL DATA:  Anemia, difficult access, poor peripheral veins, access for blood transfusion  EXAM: ULTRASOUND GUIDANCE FOR VASCULAR ACCESS  PERIPHERAL IV START BY PHYSICIAN.  Date:  9/2/20169/05/2014 5:13 pm  Radiologist:  M. Daryll Brod, MD  Guidance:  Ultrasound  FLUOROSCOPY TIME:  None.  MEDICATIONS AND MEDICAL HISTORY: None.  ANESTHESIA/SEDATION: None.  CONTRAST:  None.  COMPLICATIONS: None immediate  PROCEDURE: Informed consent was obtained from the patient following explanation of the procedure, risks, benefits and alternatives. The patient understands, agrees and consents for the procedure. All questions were addressed. A time out was performed.  Maximal barrier sterile technique utilized including caps, mask, sterile gowns, sterile gloves, large sterile drape, hand hygiene, and ChloraPrep  Preliminary ultrasound performed. Left basilic vein demonstrated. Under sterile conditions and local anesthesia, a 16 gauge Angiocath was advanced into the left basilic vein under ultrasound. Position confirmed with ultrasound. Images obtained for documentation. Blood aspirated easily followed by saline flush. This was secured externally with a sterile dressing. Ready for use.  IMPRESSION: Successful ultrasound left upper extremity basilic vein peripheral IV start.   Electronically Signed   By: [name] M.D.   On: 12/01/2014 17:19   Dg Abd 2 Views  11/27/2014   CLINICAL DATA:  Followup small bowel obstruction  EXAM: ABDOMEN - 2 VIEW  COMPARISON:  11/26/2014  FINDINGS: The nasogastric tube is identified with  tip in the stomach. The side port is below the GE junction. Dilated loops of small bowel and air-fluid levels are again noted. These measure up to 6.7 cm. Enteric contrast material is identified within the colon up to the level of the rectum.  IMPRESSION: 1. NG tube is within the stomach. 2. Persistent partial small bowel obstruction 3. Enteric contrast material is identified up the level of the rectum.   Electronically Signed   By: Kerby Moors M.D.   On: 11/27/2014 09:35   Dg Abd 2 Views  11/26/2014   CLINICAL DATA:  Abdominal distention with nausea and vomiting. Surgery in April. Small bowel obstruction symptoms.  EXAM: ABDOMEN - 2 VIEW  COMPARISON:  11/24/2014  FINDINGS: Upright and supine views. Upright view demonstrates persistent small bowel air-fluid levels, increased. Small left pleural effusion. There may be a nasogastric tube looped in the esophagus, incompletely imaged. Surgical clips in the upper abdomen. A metallic object projecting over the a lower chest is presumably external to the patient.  Supine images demonstrate small bowel distension. Example loop measures 6.7 cm today, versus 6.4 cm on the prior. Contrast is identified within the colon. No pneumatosis.  IMPRESSION: Similar small bowel obstruction pattern, without free intraperitoneal air or other acute complication.  The nasogastric may be looped in the distal esophagus. Incompletely imaged. These results will be called to the ordering clinician or representative by the Radiologist Assistant, and communication documented in the PACS or zVision Dashboard.   Electronically Signed   By: Abigail Miyamoto M.D.   On: 11/26/2014 10:07   Dg Abd Acute W/chest  11/24/2014  CLINICAL DATA:  Bloating and nausea since yesterday. Vomiting today. History of bowel obstructions. History of retroperitoneal liposarcoma.  EXAM: DG ABDOMEN ACUTE W/ 1V CHEST  COMPARISON:  CT abdomen and pelvis 11/23/2014  FINDINGS: Right PICC catheter tip over the low SVC  region. No pneumothorax. Linear fibrosis or atelectasis in the left lung base. No focal airspace disease or consolidation in the lungs. Normal heart size and pulmonary vascularity.  Surgical clips in the upper abdomen. Contrast material is demonstrated in the right colon. There are prominent gas-filled distended small bowel loops in the mid abdomen and multiple air-fluid levels are demonstrated on the upright view. Appearance is consistent with mechanical small bowel obstruction. No free intra-abdominal air. No radiopaque stones. Degenerative changes in the spine.  IMPRESSION: No evidence of active pulmonary disease. Fibrosis or atelectasis in the left lung base.  Gaseous distention of mid abdominal small bowel with multiple air-fluid levels consistent with mechanical small bowel obstruction.   Electronically Signed   By: Lucienne Capers M.D.   On: 11/24/2014 21:51   Dg Abd Portable 1v  11/28/2014   CLINICAL DATA:  Evaluate NG tube repositioning.  EXAM: PORTABLE ABDOMEN - 1 VIEW  COMPARISON:  11/27/2014  FINDINGS: The nasogastric tube has been repositioned. The tip is in the fundus of the stomach in the side port is below the GE junction. Surgical clips are noted in the left upper quadrant of the abdomen. Again noted are dilated loops of bowel measuring up to 5 cm.  IMPRESSION: 1. Nasogastric tube is in the stomach. 2. Persistent small bowel dilatation consistent with obstruction.   Electronically Signed   By: Kerby Moors M.D.   On: 11/28/2014 09:05   Dg Abd Portable 1v  11/26/2014   CLINICAL DATA:  Nasogastric tube placement  EXAM: PORTABLE ABDOMEN - 1 VIEW  COMPARISON:  Portable exam 1318 hours compared to 11/26/2014  FINDINGS: Tip of nasogastric tube projects over proximal stomach.  Numerous surgical clips in the perigastric region.  Multiple dilated loops of small bowel again identified compatible with small bowel obstruction.  Small amount of contrast within a decompressed colon.  No definite bowel  wall thickening.  Lung bases clear.  IMPRESSION: Small bowel obstruction.  Tip of nasogastric tube projects over proximal stomach.   Electronically Signed   By: Lavonia Dana M.D.   On: 11/26/2014 16:49   Dg Abd Portable 1v-small Bowel Obstruction Protocol-initial, 8 Hr Delay  11/08/2014   CLINICAL DATA:  Small bowel obstruction protocol. Contrast administered at 0345 hours.  EXAM: PORTABLE ABDOMEN - 1 VIEW  COMPARISON:  CT and radiographs 11/07/2014.  FINDINGS: 1156 hours. Nasogastric tube is visualized within the gastric fundus. There is a small amount of contrast within the stomach and distal small bowel which remains dilated. Contrast is also present within the colon, extending to the rectum, increased in density from yesterday, implying that some of the recently administered contrast has passed into the colon. There is still moderate diffuse small bowel distension. Multiple left abdominal surgical clips are noted. No evidence of free intraperitoneal air or contrast extravasation.  IMPRESSION: Persistent moderate small bowel distension in excess of the colon. However, the volume of contrast in the colon has increased from yesterday's radiographs, implying some passage of contrast through the small bowel.   Electronically Signed   By: Richardean Sale M.D.   On: 11/08/2014 12:31   Dg Abd Portable 1v-small Bowel Protocol-position Verification  11/08/2014   CLINICAL DATA:  Nasogastric tube placement  EXAM:  PORTABLE ABDOMEN - 1 VIEW  COMPARISON:  CT abdomen and pelvis November 07, 2014  FINDINGS: Nasogastric tube tip and side port are in the stomach. There are loops of dilated bowel indicating a degree of obstruction. No free air apparent. There are multiple surgical clips in the mid abdomen.  IMPRESSION: Nasogastric tube tip and side port in stomach. Dilated loops of bowel consistent with a degree of obstruction.   Electronically Signed   By: Lowella Grip III M.D.   On: 11/08/2014 01:12     Medical  Consultants:    Infectious Disease: Carlyle Basques, MD  General Surgery: Alphonsa Overall, MD  Anti-Infectives:    Daptomycin  Flagyl  Subjective:   Darryl Griffith is sleepy, but denies N/V (NGT clamped yesterday).  No further BMs or flatus.  Objective:    Filed Vitals:   12/06/14 2100 12/07/14 0000 12/07/14 0356 12/07/14 0620  BP: 164/61 134/64  162/67  Pulse: 122 117  110  Temp: 99.6 F (37.6 C) 98.9 F (37.2 C)  97.7 F (36.5 C)  TempSrc: Oral Axillary  Oral  Resp: 25 26 28 24   Height:      Weight:      SpO2: 97% 99% 98% 98%    Intake/Output Summary (Last 24 hours) at 12/07/14 0835 Last data filed at 12/07/14 0881  Gross per 24 hour  Intake 3496.5 ml  Output   1920 ml  Net 1576.5 ml   Filed Weights   11/24/14 2020  Weight: 73.936 kg (163 lb)    Exam: Gen:  NAD, NG tube in place Cardiovascular: Tachycardic, No M/R/G Respiratory:  Lungs CTAB Gastrointestinal:  Abdomen soft, NT/ND, + BS, abdominal dressings intact Extremities:  No C/E/C   Data Reviewed:    Labs: Basic Metabolic Panel:  Recent Labs Lab 12/01/14 0453 12/03/14 1235 12/04/14 0600 12/07/14 0512  NA 131*  --  130* 131*  K 4.2  --  4.0 4.5  CL 97*  --  100* 103  CO2 28  --  24 20*  GLUCOSE 137*  --  130* 148*  BUN 29*  --  37* 37*  CREATININE 1.00 1.21 1.13 1.12  CALCIUM 8.1*  --  8.2* 7.8*  MG  --   --  2.1 1.9  PHOS  --   --  4.0 3.3   GFR Estimated Creatinine Clearance: 59.4 mL/min (by C-G formula based on Cr of 1.12). Liver Function Tests:  Recent Labs Lab 12/04/14 0600 12/07/14 0512  AST 16 16  ALT 8* 9*  ALKPHOS 53 74  BILITOT 0.6 0.6  PROT 5.1* 5.0*  ALBUMIN 2.4* 2.1*   CBC:  Recent Labs Lab 12/02/14 1140 12/03/14 0528 12/04/14 0600 12/05/14 0510 12/06/14 0500  WBC 1.6* 1.6* 1.2* 1.2* 1.9*  NEUTROABS 0.7* 0.6* 0.3* 0.2*  --   HGB 7.0* 7.4* 7.2* 6.6* 7.6*  HCT 21.0* 22.3* 21.6* 20.2* 23.4*  MCV 82.7 82.3 82.4 82.1 82.7  PLT 281 314 316 315 377    Cardiac Enzymes:  Recent Labs Lab 12/03/14 1235  CKTOTAL 20*   CBG:  Recent Labs Lab 12/06/14 0559 12/06/14 1141 12/06/14 1744 12/06/14 2357 12/07/14 0617  GLUCAP 143* 158* 135* 168* 148*   Lipid Profile: No results for input(s): CHOL, HDL, LDLCALC, TRIG, CHOLHDL, LDLDIRECT in the last 72 hours. Microbiology Recent Results (from the past 240 hour(s))  Culture, routine-abscess     Status: None   Collection Time: 11/27/14 12:22 PM  Result Value Ref Range Status  Specimen Description   Final    ABSCESS ASPIRATION OF RECURRENT LEFT RETROPERITONEAL FLUID. Nephrectomy   Special Requests Normal  Final   Gram Stain   Final    ABUNDANT WBC PRESENT,BOTH PMN AND MONONUCLEAR NO SQUAMOUS EPITHELIAL CELLS SEEN NO ORGANISMS SEEN Performed at [facility]    Culture   Final    NO GROWTH 3 DAYS Performed at [facility]    Report Status 11/30/2014 FINAL  Final  Anaerobic culture     Status: None   Collection Time: 11/27/14 12:22 PM  Result Value Ref Range Status   Specimen Description ABSCESS LEFT RETROPERITONEAL FLUID. Nephrectomy   Final   Special Requests Normal  Final   Gram Stain   Final    ABUNDANT WBC PRESENT,BOTH PMN AND MONONUCLEAR NO SQUAMOUS EPITHELIAL CELLS SEEN NO ORGANISMS SEEN Performed at [facility]    Culture   Final    NO ANAEROBES ISOLATED Performed at [facility]    Report Status 12/02/2014 FINAL  Final  Fungus culture w smear     Status: None (Preliminary result)   Collection Time: 11/27/14 12:22 PM  Result Value Ref Range Status   Specimen Description ABSCESS LEFT RETROPERITONEAL FLUID/Nephrectomy  Final   Special Requests Normal  Final   Fungal Smear   Final    NO YEAST OR FUNGAL ELEMENTS SEEN Performed at [facility]    Culture   Final    CULTURE IN PROGRESS FOR FOUR WEEKS Performed at [facility]    Report Status PENDING  Incomplete  Surgical pcr screen     Status: None   Collection  Time: 11/29/14  6:02 AM  Result Value Ref Range Status   MRSA, PCR NEGATIVE NEGATIVE Final   Staphylococcus aureus NEGATIVE NEGATIVE Final    Comment:        The Xpert SA Assay (FDA approved for NASAL specimens in patients over 44 years of age), is one component of a comprehensive surveillance program.  Test performance has been validated by [facility] for patients greater than or equal to 55 year old. It is not intended to diagnose infection nor to guide or monitor treatment.   Culture, routine-abscess     Status: None   Collection Time: 11/29/14 10:23 AM  Result Value Ref Range Status   Specimen Description ABSCESS BACK  Final   Special Requests NONE  Final   Gram Stain   Final    RARE WBC PRESENT, PREDOMINANTLY PMN NO SQUAMOUS EPITHELIAL CELLS SEEN NO ORGANISMS SEEN Performed at [facility]    Culture   Final    NO GROWTH 3 DAYS Performed at [facility]    Report Status 12/02/2014 FINAL  Final  Anaerobic culture     Status: None   Collection Time: 11/29/14 10:25 AM  Result Value Ref Range Status   Specimen Description ABSCESS BACK  Final   Special Requests NONE  Final   Gram Stain   Final    RARE WBC PRESENT, PREDOMINANTLY PMN NO SQUAMOUS EPITHELIAL CELLS SEEN NO ORGANISMS SEEN Performed at [facility]    Culture   Final    NO ANAEROBES ISOLATED Performed at [facility]    Report Status 12/04/2014 FINAL  Final  Body fluid culture     Status: None   Collection Time: 11/29/14 11:42 AM  Result Value Ref Range Status   Specimen Description FLUID PERITONEAL LEFT PERITONEAL FLUID  Final   Special Requests  NONE  Final   Gram Stain   Final    FEW WBC PRESENT, PREDOMINANTLY PMN NO ORGANISMS SEEN    Culture   Final    NO GROWTH 3 DAYS Performed at [facility]    Report Status 12/02/2014 FINAL  Final  Anaerobic culture     Status: None   Collection Time: 11/29/14 11:42 AM  Result Value Ref Range Status   Specimen  Description FLUID PERITONEAL  Final   Special Requests NONE  Final   Gram Stain   Final    FEW WBC PRESENT, PREDOMINANTLY PMN NO ORGANISMS SEEN    Culture   Final    NO ANAEROBES ISOLATED Performed at [facility]    Report Status 12/04/2014 FINAL  Final     Medications:   . sodium chloride   Intravenous Once  . sodium chloride   Intravenous Once  . bisacodyl  10 mg Rectal Daily  . DAPTOmycin (CUBICIN)  IV  600 mg Intravenous Q24H  . heparin subcutaneous  5,000 Units Subcutaneous 3 times per day  . HYDROmorphone PCA 0.3 mg/mL   Intravenous 6 times per day  . insulin aspart  0-9 Units Subcutaneous 4 times per day  . lip balm  1 application Topical BID  . methocarbamol (ROBAXIN)  IV  500 mg Intravenous 3 times per day  . metronidazole  500 mg Intravenous Q8H  . pantoprazole (PROTONIX) IV  40 mg Intravenous Q12H   Continuous Infusions: . sodium chloride 10 mL/hr at 11/30/14 0426  . Marland KitchenTPN (CLINIMIX-E) Adult 93 mL/hr at 12/06/14 1731   And  . fat emulsion 240 mL (12/06/14 1732)    Time spent: 25 minutes.    LOS: 13 days   [facility] Pager [phone]. If unable to reach me by pager, please call my cell phone at [phone].  *Please refer to [email or web address], password [email or web address] to get updated schedule on who will round on this patient, as hospitalists switch teams weekly. If 7PM-7AM, please contact night-coverage at [email or web address], password [email or web address] for any overnight needs.  12/07/2014, 8:35 AM

## 2014-12-07 NOTE — Progress Notes (Signed)
Clinical Social Work  CSW went to room to follow up with patient to discuss DC plans. Patient sleeping and no family present. CSW will continue to follow.  Rosa Sanchez, [place] [phone]

## 2014-12-07 NOTE — Progress Notes (Signed)
PARENTERAL NUTRITION CONSULT NOTE - Follow-Up  Pharmacy Consult for TPN Indication: SBO  Allergies  Allergen Reactions  . Compazine [Prochlorperazine] Other (See Comments)    Made pt really lethargic, and sleepy   Patient Measurements: Height: 5' 8" (172.7 cm) Weight: 163 lb (73.936 kg) IBW/kg (Calculated) : 68.4  Vital Signs: Temp: 97.7 F (36.5 C) (09/08 0620) Temp Source: Oral (09/08 0620) BP: 162/67 mmHg (09/08 0620) Pulse Rate: 110 (09/08 0620) Intake/Output from previous day: 09/07 0701 - 09/08 0700 In: 3616.5 [I.V.:477.5; NG/GT:245; IV Piggyback:165; TPN:2489] Out: 1920 [Urine:1750; Emesis/NG output:150; Drains:20] Intake/Output from this shift:    Labs:  Recent Labs  12/05/14 0510 12/06/14 0500  WBC 1.2* 1.9*  HGB 6.6* 7.6*  HCT 20.2* 23.4*  PLT 315 377    Recent Labs  12/07/14 0512  NA 131*  K 4.5  CL 103  CO2 20*  GLUCOSE 148*  BUN 37*  CREATININE 1.12  CALCIUM 7.8*  MG 1.9  PHOS 3.3  PROT 5.0*  ALBUMIN 2.1*  AST 16  ALT 9*  ALKPHOS 74  BILITOT 0.6   Estimated Creatinine Clearance: 59.4 mL/min (by C-G formula based on Cr of 1.12).    Recent Labs  12/06/14 1744 12/06/14 2357 12/07/14 0617  GLUCAP 135* 168* 148*    Medical History: Past Medical History  Diagnosis Date  . Hypertension   . Anginal pain     hx of CP saw Dr Wynonia Lawman 09/20/2013 H&P per chart   . Left Retroperitoneal mass s/p resection/L nephrectomy 07/13/2014   . Postoperative anemia due to acute blood loss 07/15/2014  . SBO (small bowel obstruction) 08/01/2014, 10/11/14  . Retroperitoneal fluid collection   . 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 Impaired fasting glucose 11/25/2014  . Benign neoplasm of colon 11/25/2014  . Actinic keratosis 11/25/2014  . Mixed hyperlipidemia 11/25/2014    Insulin Requirements: 3 units over the past 24 hours (on sensitive SSI q6h) - no hx DM  Current  Nutrition: NPO - NG tube   IVF: NS at [facility] access: PICC placed 10/12/14 TPN start date: 8/28  ASSESSMENT                                                                                                          HPI:  77 yoM s/p resection L retroperitoneal sarcoma, L nephrectomy 07/13/14 developed VRE abscess. Hx abd bloating, N/V 2 days PTA, SBO seen on CT for Ca restaging.  Hx recurrent SBO. Begin TPN per Pharmacy  Significant events:   8/29: plan for exp laparotomy on 8/31; s/p aspiration of L retroperitoneal fluid collection on 8/29 8/31:  s/p exp lap -->  I&D L back abscess, lysis of adhesions, small bowel resection, placement og drain into L retroperitoneal abscess 9/2: bloody NGT output, PPI IV BID added;  change clinimix E 5/15 to E 5/20 9/7: NGT clamp study- minimal residual and no n/v  Today:   Glucose (goal <150): all readings < 150  Electrolytes - Na low at 131;  K, mag, phos WNL (on 9/8)  Renal - WNL   LFTs - AST/ALT low, Alk Phos, Tbili WNL (on 9/5)  TGs - 91 (9/5)  Prealbumin - 6.1 (9/5)  NUTRITIONAL GOALS                                                                                             RD recs:   Kcal: 2300-2500  Protein: 110-120 grams  Fluid: 2.2-2.5 L/day  Clinimix E 5/20 at a goal rate of 93 ml/hr + 20% fat emulsion at 10 ml/hr to provide: 112 g/day protein, 2444 Kcal/day (per dietician recommendations)   PLAN                                                                                                                         At 1800 today:  Continue Clinimix E 5/20 at goal rate of 93 ml/hr.  20% fat emulsion at 10 ml/hr.  TPN to contain standard multivitamins and trace elements.  Continue IVF at [facility].  Continue sensitive SSI q6h.  TPN lab panels on Mondays & Thursdays.  F/u daily.   Netta Cedars, PharmD, BCPS Pager: [phone] 12/07/2014 7:38 AM

## 2014-12-07 NOTE — Progress Notes (Addendum)
Physical Therapy Treatment Patient Details Name: Darryl Griffith MRN: [id number] DOB: [birth year] Today's Date: 12/07/2014    History of Present Illness Darryl Griffith is a 69 y.o. male with a history of Left Flank Area Liposarcoma S/P Resection and Left Nephrectomy 06/2014 with Radiation Rx completed on 08/2374 complicated by abscess formation (+VRE) who presents to the ED 11/24/14  with complaints of ABD bloating x 2 days and nausea and vomiting x 4 today. He denies any hematemesis, fevers or chills. A Ct scan was performed 1 day ago for cancer staging and revealed a high grade partial small bowl obstruction.  on 11/29/14 Incision and drainage left back abscess, exploratory laparotomy, lysis of adhesions, small bowel resection, placement of drain into left retroperitoneal abscess     PT Comments    Pt progressing slowly d/t pain and fatigue, HR in 130s EOB; encouraged OOB, bed exercises, discussed importance of mobility with regard to D/C plan, pt nods understanding  Follow Up Recommendations  Home health PT;Supervision/Assistance - 24 hour;SNF (depending on progress)     Equipment Recommendations  Rolling walker with 5" wheels    Recommendations for Other Services       Precautions / Restrictions Restrictions Weight Bearing Restrictions: No    Mobility  Bed Mobility Overal bed mobility: Needs Assistance Bed Mobility: Sit to Supine       Sit to supine: Mod assist;HOB elevated   General bed mobility comments: incr time, cues for self assist; assist with bil LEs onto bed, scoots in supine with min assist and incr time  Transfers                 General transfer comment: pt refused d/t pain, just got to [facility] with NT  Ambulation/Gait                 Stairs            Wheelchair Mobility    Modified Rankin (Stroke Patients Only)       Balance                                    Cognition Arousal/Alertness: Awake/alert Behavior During  Therapy: Flat affect Overall Cognitive Status: Within Functional Limits for tasks assessed                      Exercises General Exercises - Lower Extremity Ankle Circles/Pumps: AROM;AAROM;10 reps Quad Sets: Strengthening;AROM;Both;5 reps    General Comments        Pertinent Vitals/Pain Pain Assessment: 0-10 Pain Score: 8  Pain Location: abdomen Pain Descriptors / Indicators: Constant Pain Intervention(s): Limited activity within patient's tolerance;Monitored during session;PCA encouraged;Repositioned    Home Living                      Prior Function            PT Goals (current goals can now be found in the care plan section) Acute Rehab PT Goals PT Goal Formulation: With patient Time For Goal Achievement: 12/19/14 Potential to Achieve Goals: Good Progress towards PT goals: Progressing toward goals    Frequency  Min 3X/week    PT Plan Current plan remains appropriate    Co-evaluation             End of Session   Activity Tolerance: No increased pain;Patient limited by fatigue Patient left: in bed;with call bell/phone  within reach     Time: 1203-1219 PT Time Calculation (min) (ACUTE ONLY): 16 min  Charges:  $Therapeutic Exercise: 8-22 mins                    G Codes:      Darryl Griffith 01/04/2015, 12:33 PM

## 2014-12-07 NOTE — Progress Notes (Signed)
Patient ID: Darryl Griffith, male   DOB: 06-Oct-1945, 69 y.o.   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 sodium chloride   Intravenous Once  . sodium chloride   Intravenous Once  . bisacodyl  10 mg Rectal Daily  . DAPTOmycin (CUBICIN)  IV  600 mg Intravenous Q24H  . heparin subcutaneous  5,000 Units Subcutaneous 3 times per day  . HYDROmorphone PCA 0.3 mg/mL   Intravenous 6 times per day  . insulin aspart  0-9 Units Subcutaneous 4 times per day  . lip balm  1 application Topical BID  . methocarbamol (ROBAXIN)  IV  500 mg Intravenous 3 times per day  . metronidazole  500 mg Intravenous Q8H  . pantoprazole (PROTONIX) IV  40 mg Intravenous Q12H    SUBJECTIVE: He continues to have abdominal pain that he rates 4 out of 10. He says he has not tried to eat or drink anything today. He denies nausea and vomiting. He does not feel like he will be able to sit up or roll over to let me examine his back incision.  Review of Systems: Pertinent items are noted in HPI.  Past Medical History  Diagnosis Date  . Hypertension   . Anginal pain     hx of CP saw Dr Wynonia Lawman 09/20/2013 H&P per chart   . Left Retroperitoneal mass s/p resection/L nephrectomy 07/13/2014   . Postoperative anemia due to acute blood loss 07/15/2014  . SBO (small bowel obstruction) 08/01/2014, 10/11/14  . Retroperitoneal fluid collection   . 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 Impaired fasting glucose 11/25/2014  . Benign neoplasm of colon 11/25/2014  . Actinic keratosis 11/25/2014  . Mixed hyperlipidemia 11/25/2014    Social History  Substance Use Topics  . Smoking status: Never Smoker   . Smokeless tobacco: Never Used  . Alcohol Use: No    Family History  Problem Relation Age of Onset  . Adopted: Yes  . Family history unknown: Yes   Allergies  Allergen Reactions  . Compazine [Prochlorperazine] Other (See Comments)    Made pt really lethargic, and sleepy    OBJECTIVE: Filed Vitals:   12/07/14 0356 12/07/14 0620 12/07/14 1039 12/07/14 1501  BP:  162/67 162/72 150/63  Pulse:  110 116 113  Temp:  97.7 F (36.5 C) 100.9 F (38.3 C) 98.2 F (36.8 C)  TempSrc:  Oral Oral Oral  Resp: 28 24 22 24   Height:      Weight:      SpO2: 98% 98% 98% 98%   Body mass index is 24.79 kg/(m^2).  General: He is resting quietly in bed. He is slow to respond to questions. He is using his PCA pump Skin: No rash Lungs: Clear Cor: Regular S1 and  S2 with no murmurs Abdomen: Soft with minimal diffuse tenderness. Left upper quadrant drain remains in place with 20 mL of output noted yesterday.  Lab Results Lab Results  Component Value Date   WBC 1.9* 12/06/2014   HGB 7.6* 12/06/2014   HCT 23.4* 12/06/2014   MCV 82.7 12/06/2014   PLT 377 12/06/2014    Lab Results  Component Value Date   CREATININE 1.12 12/07/2014   BUN 37* 12/07/2014   NA 131* 12/07/2014   K 4.5 12/07/2014   CL 103 12/07/2014   CO2 20* 12/07/2014    Lab Results  Component Value Date   ALT 9* 12/07/2014   AST 16 12/07/2014   ALKPHOS 74 12/07/2014   BILITOT 0.6 12/07/2014     Microbiology: Recent Results (from the past 240 hour(s))  Surgical pcr screen     Status: None   Collection Time: 11/29/14  6:02 AM  Result Value Ref Range Status   MRSA, PCR NEGATIVE NEGATIVE Final   Staphylococcus aureus NEGATIVE NEGATIVE Final    Comment:        The Xpert SA Assay  (FDA approved for NASAL specimens in patients over 42 years of age), is one component of a comprehensive surveillance program.  Test performance has been validated by [facility] for patients greater than or equal to 52 year old. It is not intended to diagnose infection nor to guide or monitor treatment.   Culture, routine-abscess     Status: None   Collection Time: 11/29/14 10:23 AM  Result Value Ref Range Status   Specimen Description ABSCESS BACK  Final   Special Requests NONE  Final   Gram Stain   Final    RARE WBC PRESENT, PREDOMINANTLY PMN NO SQUAMOUS EPITHELIAL CELLS SEEN NO ORGANISMS SEEN Performed at [facility]    Culture   Final    NO GROWTH 3 DAYS Performed at [facility]    Report Status 12/02/2014 FINAL  Final  Anaerobic culture     Status: None   Collection Time: 11/29/14 10:25 AM  Result Value Ref Range Status   Specimen Description ABSCESS BACK  Final   Special Requests NONE  Final   Gram Stain   Final    RARE WBC PRESENT, PREDOMINANTLY PMN NO SQUAMOUS EPITHELIAL CELLS SEEN NO ORGANISMS SEEN Performed at [facility]    Culture   Final    NO ANAEROBES ISOLATED Performed at [facility]    Report Status 12/04/2014 FINAL  Final  Body fluid culture     Status: None   Collection Time: 11/29/14 11:42 AM  Result Value Ref Range Status   Specimen Description FLUID PERITONEAL LEFT PERITONEAL FLUID  Final   Special Requests NONE  Final   Gram Stain   Final    FEW WBC PRESENT, PREDOMINANTLY PMN NO ORGANISMS SEEN    Culture   Final    NO GROWTH 3 DAYS Performed at [facility]    Report Status 12/02/2014 FINAL  Final  Anaerobic culture     Status: None   Collection Time: 11/29/14 11:42 AM  Result Value Ref Range Status   Specimen Description FLUID PERITONEAL  Final   Special Requests NONE  Final   Gram Stain   Final    FEW WBC PRESENT, PREDOMINANTLY PMN NO ORGANISMS SEEN    Culture   Final    NO  ANAEROBES ISOLATED Performed at [facility]    Report Status 12/04/2014 FINAL  Final  ASSESSMENT: Dr. 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 It will be helpful for me to have her input when deciding on optimal duration of antibiotics therapy.  PLAN: 1. Continue daptomycin and metronidazole for now 2. Discussed duration of antibiotic therapy with Dr. Mitzi Davenport, [place] [facility] [phone] pager   [phone] cell 12/07/2014, 3:32 PM

## 2014-12-07 NOTE — Progress Notes (Signed)
Barry Dienes, MD advanced patients diet to clear liquids late this morning.  Patient offered clear liquids at 11am, by RN. Patient refused.  Patient offered clear liquids for lunch by NT. Patient refused.   Patient offered clear liquids again at 1400. RN listed items available. Patient educated about the importance of attempting clear liquids. Patient refused.   Patient offered clear liquids again at 1730 by NT for dinner. Patient refused for the 4th time today.     Patient continually refusing care throughout the day. This morning he refused his suppository. Patient also refused his bath, and shower.   Patient has gotten up once this afternoon to use the [facility]. Patient then refused PT, and PT only helped him get back to bed.   Patient has refused to walk all day, and has also refused his PAS hose. Patient agreed to heparin shot, and was educated about the importance of mobility to prevent blood clots, and also to help his body heal.    RN asked repeatedly if patient needed help with anything to make him more comfortable, or asked if he had questions. Patient looked at RN with a "blank" look, and shook his head.   This afternoon patient refused his vital signs to be taken. RN educated patient about the importance of allowing [place] to do this assessment, and then he agreed.   Patient also refused to allow Megan Salon, MD to look at his wound on his back.   Patient has not allowed for [place] to care for him today.      The only thing that I was able to get from him is that he does not understand what is going on. I asked him directly around 1600, if he understood was what was going on with his care, and he shook his head.  I think patient could benefit from a pysch consult, as well as a spiritual care consult. Spiritual care aware of patient/family situation, and may be of help. I think patient needs to be watched closely because he is not communicating with the nursing staff.

## 2014-12-08 DIAGNOSIS — K56609 Unspecified intestinal obstruction, unspecified as to partial versus complete obstruction: Secondary | ICD-10-CM | POA: Diagnosis present

## 2014-12-08 DIAGNOSIS — T888XXS Other specified complications of surgical and medical care, not elsewhere classified, sequela: Secondary | ICD-10-CM

## 2014-12-08 DIAGNOSIS — T888XXA Other specified complications of surgical and medical care, not elsewhere classified, initial encounter: Secondary | ICD-10-CM | POA: Diagnosis present

## 2014-12-08 LAB — CBC WITH DIFFERENTIAL/PLATELET
Basophils Absolute: 0 10*3/uL (ref 0.0–0.1)
Basophils Relative: 1 % (ref 0–1)
EOS ABS: 0.2 10*3/uL (ref 0.0–0.7)
Eosinophils Relative: 5 % (ref 0–5)
HCT: 22 % — ABNORMAL LOW (ref 39.0–52.0)
Hemoglobin: 7.2 g/dL — ABNORMAL LOW (ref 13.0–17.0)
LYMPHS ABS: 0.8 10*3/uL (ref 0.7–4.0)
Lymphocytes Relative: 23 % (ref 12–46)
MCH: 27.5 pg (ref 26.0–34.0)
MCHC: 32.7 g/dL (ref 30.0–36.0)
MCV: 84 fL (ref 78.0–100.0)
MONO ABS: 1.2 10*3/uL — AB (ref 0.1–1.0)
Monocytes Relative: 34 % — ABNORMAL HIGH (ref 3–12)
NEUTROS PCT: 37 % — AB (ref 43–77)
Neutro Abs: 1.4 10*3/uL — ABNORMAL LOW (ref 1.7–7.7)
PLATELETS: 430 10*3/uL — AB (ref 150–400)
RBC: 2.62 MIL/uL — AB (ref 4.22–5.81)
RDW: 20.1 % — AB (ref 11.5–15.5)
WBC: 3.6 10*3/uL — AB (ref 4.0–10.5)

## 2014-12-08 LAB — FUNGUS CULTURE W SMEAR
FUNGAL SMEAR: NONE SEEN
Special Requests: NORMAL

## 2014-12-08 LAB — GLUCOSE, CAPILLARY
GLUCOSE-CAPILLARY: 146 mg/dL — AB (ref 65–99)
GLUCOSE-CAPILLARY: 148 mg/dL — AB (ref 65–99)
Glucose-Capillary: 148 mg/dL — ABNORMAL HIGH (ref 65–99)
Glucose-Capillary: 158 mg/dL — ABNORMAL HIGH (ref 65–99)

## 2014-12-08 MED ORDER — FAT EMULSION 20 % IV EMUL
240.0000 mL | INTRAVENOUS | Status: AC
  Administered 2014-12-08 (×2): 240 mL via INTRAVENOUS
  Filled 2014-12-08: qty 250

## 2014-12-08 MED ORDER — SODIUM CHLORIDE 0.9 % IV SOLN: Freq: Once | INTRAVENOUS | Status: DC

## 2014-12-08 MED ORDER — M.V.I. ADULT IV INJ
INJECTION | INTRAVENOUS | Status: AC
  Administered 2014-12-08: 18:00:00 via INTRAVENOUS
  Filled 2014-12-08: qty 2232

## 2014-12-08 NOTE — Progress Notes (Addendum)
Progress Note   [name] UKG:[id number] DOB: May 17, 1945 DOA: 11/24/2014 PCP: Gennette Pac, MD   Brief Narrative:   Darryl Griffith is an 69 y.o. 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 General surgery was consulted on admission. Patient did not improve with conservative measures and underwent surgery 11/29/14: incision and drainage of left back abscess, exploratory laparotomy, lysis of adhesions, small bowel resection and placement of drain into left retroperitoneal abscess.   Assessment/Plan:   Principal problem:  SBO -Underwent surgical repair on 8/31 with exploratory laparotomy, lysis of adhesions, small bowel resection, placement of drain into left retroperitoneal abscess.  - Gen. surgery following.  Diet advanced to clears. NG tube out. Remains on TNA.  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 Follow-up oncology post discharge.  Left retroperitoneal and left upper quadrant fluid collection  - Cultures initially positive for VRE. Most recent cultures from 11/29/14 were negative. - Status post IR drainage 11/27/14. - ID following. Antibiotics per ID recommendations, will complete course of daptomycin/Flagyl on 12/13/14.  HTN - Continue hydralazine as needed.  Hypokalemia - Resolved.  Hypomagnesemia - Resolved.  Symptomatic Anemia - Monitoring blood counts.  - Status post 2 units of PRBCs 11/27/14, 1 unit 12/01/14 and 1 unit 12/05/14. - Tachycardic and dyspneic today with a hemoglobin of 7.2. We'll give an additional unit of PRBCs.  Hyponatremia - Monitor sodium, improved.    Neutropenia - Felt to be related to treatment side Vioxx. Changed to cubicin after speaking with Dr Tommy Medal. Discussed with pharmacy.   Tachycardia - Multi-factorial with infection, anemia contributory.  DVT prophylaxis - SCDs.  Code Status: Full Code Family Communication: Wife updated at bedside and daughter updated by telephone.  Disposition Plan: Unclear at present, remains on TNA. Possibly will need SNF when medically stable.  IV Access:    PICC line placed 10/12/14   Procedures and diagnostic studies:   Ct Chest W Contrast  11/23/2014   CLINICAL DATA:  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  CONTRAST:  136mL OMNIPAQUE IOHEXOL 300 MG/ML  SOLN  COMPARISON:  11/07/2014  FINDINGS: CT CHEST FINDINGS  Thoracic inlet: No neck base or axillary masses or adenopathy. Normal thyroid.  Mediastinum and hila: Heart normal in size and configuration. Mild prominence of the ascending aorta measuring 4 cm in diameter. 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 Right PICC tip projects in the lower superior vena cava. No mediastinal or hilar masses or pathologically enlarged lymph nodes.  Lungs and pleura: No lung masses or suspicious nodules. Small left pleural effusion associated with mild dependent left lower lobe atelectasis. Small focus of reticular nodular opacity in the posterior right lower lobe is likely additional atelectasis or scarring. There is no lung consolidation or edema. No pneumothorax.  CT ABDOMEN AND  PELVIS FINDINGS   There is a fluid collection that extends from the posterior medial left superior retroperitoneum, adjacent to the spleen, to the lateral margin of the superior left psoas muscle. This measures 4.9 x 2.2 cm transversely, previously 5.6 x 2.6 cm. It measures 7.6 cm an approximate superior inferior length, previously 9.2 cm. Fluid attenuation and multiple blastic and clips lie in the left upper quadrant between the posterior wall of the stomach and upper abdominal aorta. There is thickening of this portion of the stomach wall. These changes partly surround the pancreatic tail and surround the left diaphragmatic crus. No left adrenal gland is visible. Left kidney is surgically absent.  Liver and spleen:  Unremarkable.  Gallbladder and biliary tree: Unremarkable.  Pancreas: There is some heterogeneous lower attenuation along the pancreatic tail adjacent to the left upper quadrant surgical changes and low-attenuation soft tissue. This is similar to the prior exam, likely reactive edema. No other pancreatic abnormality.  Right adrenal glands: Normal.  Right kidney, ureter, bladder: 15 mm midpole cyst. No other right renal masses, no stones and no hydronephrosis. Normal ureters. Unremarkable bladder.  Lymph nodes:  No pathologically enlarged lymph nodes.  Ascites:  Trace ascites in the pelvis.  Gastrointestinal: There is a partial small bowel obstruction. The transition point is in the left upper adjacent to the postsurgical changes and residual left retroperitoneal fluid collection. Small bowel proximal to the transition is dilated to a maximum of 5.3 cm. Small bowel distal to this is decompressed. The colon is partly decompressed. There are scattered colonic diverticula mostly along the sigmoid colon. No diverticulitis. Normal-sized appendix is visualized. No bowel wall thickening or inflammatory changes. No free air.  Abdominal wall: There is edema in the subcutaneous fat mostly along the flanks. There is midline  abdominal incision. No hernias.  Vascular: Atherosclerotic changes are noted along the infrarenal abdominal aorta and branch vessels. No aneurysm.  MUSCULOSKELETAL FINDINGS  No osteoblastic or osteolytic lesions. Degenerative changes noted of the visualized spine. Bones are demineralized.  IMPRESSION: 1. Residual left retroperitoneal and left upper quadrant collection associated with postsurgical changes. The defined residual collection, along the posterior medial left retroperitoneum abutting the spleen and left psoas muscle, is smaller, now measuring 4.9 x 2.3 x 7.6 cm, previously 5.6 x 2.6 x 9.2 cm. Left upper quadrant postsurgical and inflammatory changes include thickening of the posterior wall of the stomach, stable. There are numerous vascular clips in the left retroperitoneum and left posterior medial upper quadrant. 2. Fairly high-grade partial small bowel obstruction with the transition point in the posterior medial left upper quadrant adjacent to the residual collection and postsurgical changes. This is similar to prior study. 3. No convincing local residual retroperitoneal sarcoma. 4. No evidence of metastatic disease from the sarcoma.   Electronically Signed   By: Lajean Manes M.D.   On: 11/23/2014 14:36   Ct Abdomen Pelvis W Contrast  11/23/2014   CLINICAL DATA:  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  CONTRAST:  138mL OMNIPAQUE IOHEXOL 300 MG/ML  SOLN  COMPARISON:  11/07/2014  FINDINGS: CT CHEST FINDINGS  Thoracic inlet: No neck base or axillary masses or adenopathy. Normal thyroid.  Mediastinum and  hila: Heart normal in  size and configuration. Mild prominence of the ascending aorta measuring 4 cm in diameter. 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 Right PICC tip projects in the lower superior vena cava. No mediastinal or hilar masses or pathologically enlarged lymph nodes.  Lungs and pleura: No lung masses or suspicious nodules. Small left pleural effusion associated with mild dependent left lower lobe atelectasis. Small focus of reticular nodular opacity in the posterior right lower lobe is likely additional atelectasis or scarring. There is no lung consolidation or edema. No pneumothorax.  CT ABDOMEN AND PELVIS FINDINGS  There is a fluid collection that extends from the posterior medial left superior retroperitoneum, adjacent to the spleen, to the lateral margin of the superior left psoas muscle. This measures 4.9 x 2.2 cm transversely, previously 5.6 x 2.6 cm. It measures 7.6 cm an approximate superior inferior length, previously 9.2 cm. Fluid attenuation and multiple blastic and clips lie in the left upper quadrant between the posterior wall of the stomach and upper abdominal aorta. There is thickening of this portion of the stomach wall. These changes partly surround the pancreatic tail and surround the left diaphragmatic crus. No left adrenal gland is visible. Left kidney is surgically absent.  Liver and spleen:  Unremarkable.  Gallbladder and biliary tree: Unremarkable.  Pancreas: There is some heterogeneous lower attenuation along the pancreatic tail adjacent to the left upper quadrant surgical changes and low-attenuation soft tissue. This is similar to the prior exam, likely reactive edema. No other pancreatic abnormality.  Right adrenal glands: Normal.  Right kidney, ureter, bladder: 15 mm midpole cyst. No other right renal masses, no stones and no hydronephrosis. Normal ureters. Unremarkable bladder.  Lymph nodes:  No  pathologically enlarged lymph nodes.  Ascites:  Trace ascites in the pelvis.  Gastrointestinal: There is a partial small bowel obstruction. The transition point is in the left upper adjacent to the postsurgical changes and residual left retroperitoneal fluid collection. Small bowel proximal to the transition is dilated to a maximum of 5.3 cm. Small bowel distal to this is decompressed. The colon is partly decompressed. There are scattered colonic diverticula mostly along the sigmoid colon. No diverticulitis. Normal-sized appendix is visualized. No bowel wall thickening or inflammatory changes. No free air.  Abdominal wall: There is edema in the subcutaneous fat mostly along the flanks. There is midline abdominal incision. No hernias.  Vascular: Atherosclerotic changes are noted along the infrarenal abdominal aorta and branch vessels. No aneurysm.  MUSCULOSKELETAL FINDINGS  No osteoblastic or osteolytic lesions. Degenerative changes noted of the visualized spine. Bones are demineralized.  IMPRESSION: 1. Residual left retroperitoneal and left upper quadrant collection associated with postsurgical changes. The defined residual collection, along the posterior medial left retroperitoneum abutting the spleen and left psoas muscle, is smaller, now measuring 4.9 x 2.3 x 7.6 cm, previously 5.6 x 2.6 x 9.2 cm. Left upper quadrant postsurgical and inflammatory changes include thickening of the posterior wall of the stomach, stable. There are numerous vascular clips in the left retroperitoneum and left posterior medial upper quadrant. 2. Fairly high-grade partial small bowel obstruction with the transition point in the posterior medial left upper quadrant adjacent to the residual collection and postsurgical changes. This is similar to prior study. 3. No convincing local residual retroperitoneal sarcoma. 4. No evidence of metastatic disease from the sarcoma.   Electronically Signed   By: Lajean Manes M.D.   On:  11/23/2014 14:36    Ct Aspiration  11/28/2014   CLINICAL DATA:  Recurrent left retroperitoneal fluid collection after previous resection of sarcoma. The patient now presents with a small bowel obstruction. Aspiration of the fluid collection has been requested prior to planned surgery to determine if there is any evidence of infection. This fluid collection was last aspirated on 11/10/2014 revealing vancomycin resistant Enterococcus and also previously contained a percutaneous drain for several months.  EXAM: CT GUIDED CATHETER DRAINAGE OF LEFT RETROPERITONEAL FLUID COLLECTION  ANESTHESIA/SEDATION: 1.5  Mg IV Versed; 75 mcg IV Fentanyl  Total Moderate Sedation Time: 15 minutes  PROCEDURE: The procedure risks, benefits, and alternatives were explained to the patient. Questions regarding the procedure were encouraged and answered. The patient understands and consents to the procedure. A time-out was performed prior to the procedure.  The left flank region was prepped with Betadine in a sterile fashion, and a sterile drape was applied covering the operative field. A sterile gown and sterile gloves were used for the procedure. Local anesthesia was provided with 1% Lidocaine.  Under CT guidance, a 5 [place] Yueh centesis catheter was advanced into the left retroperitoneal space. Fluid aspiration was performed through the 5 French catheter. The catheter was then removed. Additional CT was performed.  COMPLICATIONS: None  FINDINGS: Recurrent fluid collection in the medial left retroperitoneal space again identified, identical in appearance to scanning just prior to the previous aspiration procedure on 08/12. Approximately 10 mL of pink colored, turbid fluid was able to be aspirated. Material was sent for culture analysis. Postprocedural CT shows decrease in size of the fluid collection.  IMPRESSION: CT-guided aspiration of recurrent medial left retroperitoneal fluid collection yielding 10 mL of pink colored, turbid fluid. Fluid was sent  for culture analysis.   Electronically Signed   By: Aletta Edouard M.D.   On: 11/28/2014 08:21   Ct Aspiration  11/10/2014   CLINICAL DATA:  History of recurrent left retroperitoneal fluid collection status post surgical excision of a retroperitoneal liposarcoma. Most recently, a drainage catheter was placed on 08/01/2014 and remained in place for 3 months with removal of the drain on 10/31/2014. Imaging on 11/07/2014 for abdominal pain demonstrated some reaccumulation of fluid in the previously drained medial left retroperitoneal space. Aspiration under CT guidance is now performed to determine if the fluid is infected.  EXAM: CT GUIDED ASPIRATION OF LEFT RETROPERITONEAL FLUID COLLECTION  ANESTHESIA/SEDATION: 3.0 Mg IV Versed 75 mcg IV Fentanyl  Total Moderate Sedation Time:  9 minutes.  PROCEDURE: The procedure, risks, benefits, and alternatives were explained to the patient. Questions regarding the procedure were encouraged and answered. The patient understands and consents to the procedure. A time-out was performed prior to the procedure.  The left flank region was prepped with Betadine in a sterile fashion, and a sterile drape was applied covering the operative field. A sterile gown and sterile gloves were used for the procedure. Local anesthesia was provided with 1% Lidocaine.  CT was performed in a prone position. Under CT guidance, a 5 [place] Yueh centesis catheter was advanced over a 19 gauge needle into the left retroperitoneal fluid collection. Aspiration was performed through the [facility] catheter. The aspirated fluid sample was sent for culture analysis including a aerobic culture, anaerobic culture and fungal culture. Additional saline lavage was performed through the catheter with 10 mL of sterile saline. CT was performed after catheter removal.  COMPLICATIONS: None  FINDINGS: Aspiration yielded turbid yellow fluid. A total of 20 mL of fluid was  able to be aspirated from the collection. After  aspiration and additional saline lavage, fluid cavity size was smaller with a small amount of fluid remaining.  IMPRESSION: CT-guided aspiration and drainage of recurrent left retroperitoneal fluid collection. A 20 mL volume of turbid yellow fluid was able to be removed and was sent for culture analysis. Given chronicity of the collection and the presence of a drain in the collection for the last 3 months, aspiration only was performed to determine if the collection is infected prior to committing the patient to potential additional long-term catheter drainage or surgery.   Electronically Signed   By: Aletta Edouard M.D.   On: 11/10/2014 17:16   Ir Venipuncture 90yrs/older By Md  12/01/2014   CLINICAL DATA:  Anemia, difficult access, poor peripheral veins, access for blood transfusion  EXAM: ULTRASOUND GUIDANCE FOR VASCULAR ACCESS  PERIPHERAL IV START BY PHYSICIAN.  Date:  9/2/20169/05/2014 5:13 pm  Radiologist:  M. Daryll Brod, MD  Guidance:  Ultrasound  FLUOROSCOPY TIME:  None.  MEDICATIONS AND MEDICAL HISTORY: None.  ANESTHESIA/SEDATION: None.  CONTRAST:  None.  COMPLICATIONS: None immediate  PROCEDURE: Informed consent was obtained from the patient following explanation of the procedure, risks, benefits and alternatives. The patient understands, agrees and consents for the procedure. All questions were addressed. A time out was performed.  Maximal barrier sterile technique utilized including caps, mask, sterile gowns, sterile gloves, large sterile drape, hand hygiene, and ChloraPrep  Preliminary ultrasound performed. Left basilic vein demonstrated. Under sterile conditions and local anesthesia, a 16 gauge Angiocath was advanced into the left basilic vein under ultrasound. Position confirmed with ultrasound. Images obtained for documentation. Blood aspirated easily followed by saline flush. This was secured externally with a sterile dressing. Ready for use.  IMPRESSION: Successful ultrasound left upper  extremity basilic vein peripheral IV start.   Electronically Signed   By: [name] M.D.   On: 12/01/2014 17:19   Ir US Guide Vasc Access Left  12/01/2014   CLINICAL DATA:  Anemia, difficult access, poor peripheral veins, access for blood transfusion  EXAM: ULTRASOUND GUIDANCE FOR VASCULAR ACCESS  PERIPHERAL IV START BY PHYSICIAN.  Date:  9/2/20169/05/2014 5:13 pm  Radiologist:  M. Daryll Brod, MD  Guidance:  Ultrasound  FLUOROSCOPY TIME:  None.  MEDICATIONS AND MEDICAL HISTORY: None.  ANESTHESIA/SEDATION: None.  CONTRAST:  None.  COMPLICATIONS: None immediate  PROCEDURE: Informed consent was obtained from the patient following explanation of the procedure, risks, benefits and alternatives. The patient understands, agrees and consents for the procedure. All questions were addressed. A time out was performed.  Maximal barrier sterile technique utilized including caps, mask, sterile gowns, sterile gloves, large sterile drape, hand hygiene, and ChloraPrep  Preliminary ultrasound performed. Left basilic vein demonstrated. Under sterile conditions and local anesthesia, a 16 gauge Angiocath was advanced into the left basilic vein under ultrasound. Position confirmed with ultrasound. Images obtained for documentation. Blood aspirated easily followed by saline flush. This was secured externally with a sterile dressing. Ready for use.  IMPRESSION: Successful ultrasound left upper extremity basilic vein peripheral IV start.   Electronically Signed   By: [name] M.D.   On: 12/01/2014 17:19   Dg Abd 2 Views  11/27/2014   CLINICAL DATA:  Followup small bowel obstruction  EXAM: ABDOMEN - 2 VIEW  COMPARISON:  11/26/2014  FINDINGS: The nasogastric tube is identified with tip in the stomach. The side port is below the GE junction. Dilated loops of small bowel and air-fluid levels are  again noted. These measure up to 6.7 cm. Enteric contrast material is identified within the colon up to the level of the rectum.  IMPRESSION: 1.  NG tube is within the stomach. 2. Persistent partial small bowel obstruction 3. Enteric contrast material is identified up the level of the rectum.   Electronically Signed   By: Kerby Moors M.D.   On: 11/27/2014 09:35   Dg Abd 2 Views  11/26/2014   CLINICAL DATA:  Abdominal distention with nausea and vomiting. Surgery in April. Small bowel obstruction symptoms.  EXAM: ABDOMEN - 2 VIEW  COMPARISON:  11/24/2014  FINDINGS: Upright and supine views. Upright view demonstrates persistent small bowel air-fluid levels, increased. Small left pleural effusion. There may be a nasogastric tube looped in the esophagus, incompletely imaged. Surgical clips in the upper abdomen. A metallic object projecting over the a lower chest is presumably external to the patient.  Supine images demonstrate small bowel distension. Example loop measures 6.7 cm today, versus 6.4 cm on the prior. Contrast is identified within the colon. No pneumatosis.  IMPRESSION: Similar small bowel obstruction pattern, without free intraperitoneal air or other acute complication.  The nasogastric may be looped in the distal esophagus. Incompletely imaged. These results will be called to the ordering clinician or representative by the Radiologist Assistant, and communication documented in the PACS or zVision Dashboard.   Electronically Signed   By: Abigail Miyamoto M.D.   On: 11/26/2014 10:07   Dg Abd Acute W/chest  11/24/2014   CLINICAL DATA:  Bloating and nausea since yesterday. Vomiting today. History of bowel obstructions. History of retroperitoneal liposarcoma.  EXAM: DG ABDOMEN ACUTE W/ 1V CHEST  COMPARISON:  CT abdomen and pelvis 11/23/2014  FINDINGS: Right PICC catheter tip over the low SVC region. No pneumothorax. Linear fibrosis or atelectasis in the left lung base. No focal airspace disease or consolidation in the lungs. Normal heart size and pulmonary vascularity.  Surgical clips in the upper abdomen. Contrast material is demonstrated in the  right colon. There are prominent gas-filled distended small bowel loops in the mid abdomen and multiple air-fluid levels are demonstrated on the upright view. Appearance is consistent with mechanical small bowel obstruction. No free intra-abdominal air. No radiopaque stones. Degenerative changes in the spine.  IMPRESSION: No evidence of active pulmonary disease. Fibrosis or atelectasis in the left lung base.  Gaseous distention of mid abdominal small bowel with multiple air-fluid levels consistent with mechanical small bowel obstruction.   Electronically Signed   By: Lucienne Capers M.D.   On: 11/24/2014 21:51   Dg Abd Portable 1v  11/28/2014   CLINICAL DATA:  Evaluate NG tube repositioning.  EXAM: PORTABLE ABDOMEN - 1 VIEW  COMPARISON:  11/27/2014  FINDINGS: The nasogastric tube has been repositioned. The tip is in the fundus of the stomach in the side port is below the GE junction. Surgical clips are noted in the left upper quadrant of the abdomen. Again noted are dilated loops of bowel measuring up to 5 cm.  IMPRESSION: 1. Nasogastric tube is in the stomach. 2. Persistent small bowel dilatation consistent with obstruction.   Electronically Signed   By: Kerby Moors M.D.   On: 11/28/2014 09:05   Dg Abd Portable 1v  11/26/2014   CLINICAL DATA:  Nasogastric tube placement  EXAM: PORTABLE ABDOMEN - 1 VIEW  COMPARISON:  Portable exam 1318 hours compared to 11/26/2014  FINDINGS: Tip of nasogastric tube projects over proximal stomach.  Numerous surgical clips in the perigastric region.  Multiple dilated loops of small bowel again identified compatible with small bowel obstruction.  Small amount of contrast within a decompressed colon.  No definite bowel wall thickening.  Lung bases clear.  IMPRESSION: Small bowel obstruction.  Tip of nasogastric tube projects over proximal stomach.   Electronically Signed   By: Lavonia Dana M.D.   On: 11/26/2014 16:49   Dg Abd Portable 1v-small Bowel Obstruction  Protocol-initial, 8 Hr Delay  11/08/2014   CLINICAL DATA:  Small bowel obstruction protocol. Contrast administered at 0345 hours.  EXAM: PORTABLE ABDOMEN - 1 VIEW  COMPARISON:  CT and radiographs 11/07/2014.  FINDINGS: 1156 hours. Nasogastric tube is visualized within the gastric fundus. There is a small amount of contrast within the stomach and distal small bowel which remains dilated. Contrast is also present within the colon, extending to the rectum, increased in density from yesterday, implying that some of the recently administered contrast has passed into the colon. There is still moderate diffuse small bowel distension. Multiple left abdominal surgical clips are noted. No evidence of free intraperitoneal air or contrast extravasation.  IMPRESSION: Persistent moderate small bowel distension in excess of the colon. However, the volume of contrast in the colon has increased from yesterday's radiographs, implying some passage of contrast through the small bowel.   Electronically Signed   By: Richardean Sale M.D.   On: 11/08/2014 12:31     Medical Consultants:    Infectious Disease: Carlyle Basques, MD  General Surgery: Alphonsa Overall, MD  Anti-Infectives:    Daptomycin  Flagyl  Subjective:   Darryl Griffith is sleepy, but denies N/V (NGT clamped yesterday).  No further BMs or flatus.  Objective:    Filed Vitals:   12/07/14 2000 12/07/14 2252 12/08/14 0219 12/08/14 0532  BP: 159/75  152/76 157/81  Pulse: 113  95 110  Temp: 98.1 F (36.7 C)  99.1 F (37.3 C) 98.5 F (36.9 C)  TempSrc: Oral  Oral Oral  Resp: 30 25 33 31  Height:      Weight:      SpO2: 96%  98% 96%    Intake/Output Summary (Last 24 hours) at 12/08/14 0756 Last data filed at 12/08/14 0537  Gross per 24 hour  Intake 3217.06 ml  Output   1686 ml  Net 1531.06 ml   Filed Weights   11/24/14 2020  Weight: 73.936 kg (163 lb)    Exam: Gen:  NAD, NG tube in place Cardiovascular: Tachycardic, No  M/R/G Respiratory:  Lungs CTAB Gastrointestinal:  Abdomen soft, NT/ND, + BS, abdominal dressings intact Extremities:  No C/E/C   Data Reviewed:    Labs: Basic Metabolic Panel:  Recent Labs Lab 12/03/14 1235 12/04/14 0600 12/07/14 0512  NA  --  130* 131*  K  --  4.0 4.5  CL  --  100* 103  CO2  --  24 20*  GLUCOSE  --  130* 148*  BUN  --  37* 37*  CREATININE 1.21 1.13 1.12  CALCIUM  --  8.2* 7.8*  MG  --  2.1 1.9  PHOS  --  4.0 3.3   GFR Estimated Creatinine Clearance: 59.4 mL/min (by C-G formula based on Cr of 1.12). Liver Function Tests:  Recent Labs Lab 12/04/14 0600 12/07/14 0512  AST 16 16  ALT 8* 9*  ALKPHOS 53 74  BILITOT 0.6 0.6  PROT 5.1* 5.0*  ALBUMIN 2.4* 2.1*   CBC:  Recent Labs Lab 12/02/14 1140 12/03/14 0528 12/04/14 0600 12/05/14 0510 12/06/14 0500  12/08/14 0520  WBC 1.6* 1.6* 1.2* 1.2* 1.9* 3.6*  NEUTROABS 0.7* 0.6* 0.3* 0.2*  --  1.4*  HGB 7.0* 7.4* 7.2* 6.6* 7.6* 7.2*  HCT 21.0* 22.3* 21.6* 20.2* 23.4* 22.0*  MCV 82.7 82.3 82.4 82.1 82.7 84.0  PLT 281 314 316 315 377 430*   Cardiac Enzymes:  Recent Labs Lab 12/03/14 1235  CKTOTAL 20*   CBG:  Recent Labs Lab 12/07/14 0617 12/07/14 1200 12/07/14 1856 12/08/14 0014 12/08/14 0535  GLUCAP 148* 156* 140* 148* 146*   Lipid Profile: No results for input(s): CHOL, HDL, LDLCALC, TRIG, CHOLHDL, LDLDIRECT in the last 72 hours. Microbiology Recent Results (from the past 240 hour(s))  Surgical pcr screen     Status: None   Collection Time: 11/29/14  6:02 AM  Result Value Ref Range Status   MRSA, PCR NEGATIVE NEGATIVE Final   Staphylococcus aureus NEGATIVE NEGATIVE Final    Comment:        The Xpert SA Assay (FDA approved for NASAL specimens in patients over 61 years of age), is one component of a comprehensive surveillance program.  Test performance has been validated by [facility] for patients greater than or equal to 44 year old. It is not intended to diagnose  infection nor to guide or monitor treatment.   Culture, routine-abscess     Status: None   Collection Time: 11/29/14 10:23 AM  Result Value Ref Range Status   Specimen Description ABSCESS BACK  Final   Special Requests NONE  Final   Gram Stain   Final    RARE WBC PRESENT, PREDOMINANTLY PMN NO SQUAMOUS EPITHELIAL CELLS SEEN NO ORGANISMS SEEN Performed at [facility]    Culture   Final    NO GROWTH 3 DAYS Performed at [facility]    Report Status 12/02/2014 FINAL  Final  Anaerobic culture     Status: None   Collection Time: 11/29/14 10:25 AM  Result Value Ref Range Status   Specimen Description ABSCESS BACK  Final   Special Requests NONE  Final   Gram Stain   Final    RARE WBC PRESENT, PREDOMINANTLY PMN NO SQUAMOUS EPITHELIAL CELLS SEEN NO ORGANISMS SEEN Performed at [facility]    Culture   Final    NO ANAEROBES ISOLATED Performed at [facility]    Report Status 12/04/2014 FINAL  Final  Body fluid culture     Status: None   Collection Time: 11/29/14 11:42 AM  Result Value Ref Range Status   Specimen Description FLUID PERITONEAL LEFT PERITONEAL FLUID  Final   Special Requests NONE  Final   Gram Stain   Final    FEW WBC PRESENT, PREDOMINANTLY PMN NO ORGANISMS SEEN    Culture   Final    NO GROWTH 3 DAYS Performed at [facility]    Report Status 12/02/2014 FINAL  Final  Anaerobic culture     Status: None   Collection Time: 11/29/14 11:42 AM  Result Value Ref Range Status   Specimen Description FLUID PERITONEAL  Final   Special Requests NONE  Final   Gram Stain   Final    FEW WBC PRESENT, PREDOMINANTLY PMN NO ORGANISMS SEEN    Culture   Final    NO ANAEROBES ISOLATED Performed at [facility]    Report Status 12/04/2014 FINAL  Final     Medications:   . bisacodyl  10 mg Rectal Daily  . DAPTOmycin (CUBICIN)  IV  600 mg  Intravenous Q24H  . heparin subcutaneous  5,000 Units Subcutaneous 3 times per day  .  HYDROmorphone PCA 0.3 mg/mL   Intravenous 6 times per day  . insulin aspart  0-9 Units Subcutaneous 4 times per day  . lip balm  1 application Topical BID  . methocarbamol (ROBAXIN)  IV  500 mg Intravenous 3 times per day  . metronidazole  500 mg Intravenous Q8H  . pantoprazole (PROTONIX) IV  40 mg Intravenous Q12H   Continuous Infusions: . sodium chloride 10 mL/hr at 11/30/14 0426  . Marland KitchenTPN (CLINIMIX-E) Adult 93 mL/hr at 12/07/14 1717   And  . fat emulsion 240 mL (12/07/14 1718)    Time spent: 25 minutes.    LOS: 14 days   [facility] Pager [phone]. If unable to reach me by pager, please call my cell phone at [phone].  *Please refer to [email or web address], password [email or web address] to get updated schedule on who will round on this patient, as hospitalists switch teams weekly. If 7PM-7AM, please contact night-coverage at [email or web address], password [email or web address] for any overnight needs.  12/08/2014, 7:56 AM

## 2014-12-08 NOTE — Plan of Care (Signed)
Problem: Phase II Progression Outcomes Goal: Progress activity as tolerated unless otherwise ordered Outcome: Progressing Up to [facility] today. Weakness, dyspnea, and tachycardia limited activity. MD made aware and ordered 1 unit PRBC for HBG 7.2

## 2014-12-08 NOTE — Progress Notes (Signed)
Patient ID: Darryl Griffith, male   DOB: [birth year], 69 y.o.   MRN: [id number] 9 Days Post-Op   Subjective: Pt had additional stool.  Not engaging with nurses or other caregivers.  Refusing to drink, walk, speak, etc.    Objective: Vital signs in last 24 hours: Temp:  [98.1 F (36.7 C)-100.9 F (38.3 C)] 98.5 F (36.9 C) (09/09 0532) Pulse Rate:  [95-116] 110 (09/09 0532) Resp:  [15-33] 31 (09/09 0532) BP: (150-162)/(63-81) 157/81 mmHg (09/09 0532) SpO2:  [96 %-98 %] 96 % (09/09 0532) Last BM Date: 12/07/14 (small)  Intake/Output from previous day: 09/08 0701 - 09/09 0700 In: 3217.1 [I.V.:85; IV Piggyback:367; TPN:2405.1] Out: 1691 [Urine:1675; Drains:15; Stool:1] Intake/Output this shift:    General appearance: alert, no distress.   GI: normal findings: soft, non-tender Incision/Wound: Staples in place.  Irritation around staple line.   Dressing clean and dry.    Lab Results:   Recent Labs  12/06/14 0500 12/08/14 0520  WBC 1.9* 3.6*  HGB 7.6* 7.2*  HCT 23.4* 22.0*  PLT 377 430*   BMET  Recent Labs  12/07/14 0512  NA 131*  K 4.5  CL 103  CO2 20*  GLUCOSE 148*  BUN 37*  CREATININE 1.12  CALCIUM 7.8*     Studies/Results: No results found.  Anti-infectives: Anti-infectives    Start     Dose/Rate Route Frequency Ordered Stop   12/03/14 1300  DAPTOmycin (CUBICIN) 600 mg in sodium chloride 0.9 % IVPB     600 mg 224 mL/hr over 30 Minutes Intravenous Every 24 hours 12/03/14 1157     11/29/14 0830  [MAR Hold]  ceFAZolin (ANCEF) IVPB 2 g/50 mL premix     (MAR Hold since 11/29/14 0825)   2 g 100 mL/hr over 30 Minutes Intravenous On call to O.R. 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 Improving bowel function. Remove NGT today. Spiritual care consult Psych consult    LOS: 14 days    Lenita Peregrina 12/08/2014

## 2014-12-08 NOTE — Progress Notes (Signed)
PARENTERAL NUTRITION CONSULT NOTE - Follow-Up  Pharmacy Consult for TPN Indication: SBO  Allergies  Allergen Reactions  . Compazine [Prochlorperazine] Other (See Comments)    Made pt really lethargic, and sleepy   Patient Measurements: Height: 5' 8"  (172.7 cm) Weight: 163 lb (73.936 kg) IBW/kg (Calculated) : 68.4  Vital Signs: Temp: 98.5 F (36.9 C) (09/09 0532) Temp Source: Oral (09/09 0532) BP: 157/81 mmHg (09/09 0532) Pulse Rate: 110 (09/09 0532) Intake/Output from previous day: 09/08 0701 - 09/09 0700 In: 3217.1 [I.V.:85; IV Piggyback:367; TPN:2405.1] Out: 1691 [Urine:1675; Drains:15; Stool:1] Intake/Output from this shift:    Labs:  Recent Labs  12/06/14 0500 12/08/14 0520  WBC 1.9* 3.6*  HGB 7.6* 7.2*  HCT 23.4* 22.0*  PLT 377 430*    Recent Labs  12/07/14 0512  NA 131*  K 4.5  CL 103  CO2 20*  GLUCOSE 148*  BUN 37*  CREATININE 1.12  CALCIUM 7.8*  MG 1.9  PHOS 3.3  PROT 5.0*  ALBUMIN 2.1*  AST 16  ALT 9*  ALKPHOS 74  BILITOT 0.6   Estimated Creatinine Clearance: 59.4 mL/min (by C-G formula based on Cr of 1.12).    Recent Labs  12/07/14 1856 12/08/14 0014 12/08/14 0535  GLUCAP 140* 148* 146*    Medical History: Past Medical History  Diagnosis Date  . Hypertension   . Anginal pain     hx of CP saw Dr Wynonia Lawman 09/20/2013 H&P per chart   . Left Retroperitoneal mass s/p resection/L nephrectomy 07/13/2014   . Postoperative anemia due to acute blood loss 07/15/2014  . SBO (small bowel obstruction) 08/01/2014, 10/11/14  . Retroperitoneal fluid collection   . 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 Impaired fasting glucose 11/25/2014  . Benign neoplasm of colon 11/25/2014  . Actinic keratosis 11/25/2014  . Mixed hyperlipidemia 11/25/2014    Insulin Requirements: 2 units over the past 24 hours (on sensitive SSI q6h) - no hx DM  Current Nutrition: Clear liquid diet  ordered- but patient has refused  - NG tube   IVF: NS at [facility] access: PICC placed 10/12/14 TPN start date: 8/28  ASSESSMENT                                                                                                          HPI:  84 yoM s/p resection L retroperitoneal sarcoma, L nephrectomy 07/13/14 developed VRE abscess. Hx abd bloating, N/V 2 days PTA, SBO seen on CT for Ca restaging.  Hx recurrent SBO. Begin TPN per Pharmacy  Significant events:   8/29: plan for exp laparotomy on 8/31; s/p aspiration of L retroperitoneal fluid collection on 8/29 8/31:  s/p exp lap -->  I&D L back abscess, lysis of adhesions, small bowel resection, placement og drain into L retroperitoneal abscess 9/2: bloody NGT output, PPI IV BID added;  change clinimix E 5/15 to E 5/20 9/7: NGT clamp study- minimal residual and no n/v 9/8: Clear liquid diet ordered  Today:  Glucose (goal <150): all readings < 150   Electrolytes - Na low at 131;  K, mag, phos WNL (on 9/8)  Renal - WNL   LFTs - AST/ALT low, Alk Phos, Tbili WNL (on 9/8)  TGs - 91 (9/5)  Prealbumin - 6.1 (9/5)  NUTRITIONAL GOALS                                                                                             RD recs:   Kcal: 2300-2500  Protein: 110-120 grams  Fluid: 2.2-2.5 L/day  Clinimix E 5/20 at a goal rate of 93 ml/hr + 20% fat emulsion at 10 ml/hr to provide: 112 g/day protein, 2444 Kcal/day (per dietician recommendations)   PLAN                                                                                                                         At 1800 today:  Continue Clinimix E 5/20 at goal rate of 93 ml/hr.  20% fat emulsion at 10 ml/hr.  TPN to contain standard multivitamins and trace elements.  Continue IVF at [facility].  Continue sensitive SSI q6h.  TPN lab panels on Mondays & Thursdays.  F/u daily.   Netta Cedars, PharmD, BCPS Pager: [phone] 12/08/2014 8:10 AM

## 2014-12-08 NOTE — Progress Notes (Signed)
Patient ID: Darryl Griffith, male   DOB: 04-Dec-1945, 69 y.o.   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 bisacodyl  10 mg Rectal Daily  . DAPTOmycin (CUBICIN)  IV  600 mg Intravenous Q24H  . heparin subcutaneous  5,000 Units Subcutaneous 3 times per day  . HYDROmorphone PCA 0.3 mg/mL   Intravenous 6 times per day  . insulin aspart  0-9 Units Subcutaneous 4 times per day  . lip balm  1 application Topical BID  . methocarbamol (ROBAXIN)  IV  500 mg Intravenous 3 times per day  . metronidazole  500 mg Intravenous Q8H  . pantoprazole (PROTONIX) IV  40 mg Intravenous Q12H    SUBJECTIVE: He continues to have abdominal pain. He has been refusing care from his nurse.  Review of Systems: Pertinent items are noted in HPI.  Past Medical History  Diagnosis Date  . Hypertension   . Anginal pain     hx of CP saw Dr Wynonia Lawman 09/20/2013 H&P per chart   . Left Retroperitoneal mass s/p resection/L nephrectomy 07/13/2014   . Postoperative anemia due to acute blood loss 07/15/2014  . SBO (small bowel obstruction) 08/01/2014, 10/11/14  . Retroperitoneal fluid collection   . 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 Impaired fasting glucose 11/25/2014  . Benign neoplasm of colon 11/25/2014  . Actinic keratosis 11/25/2014  . Mixed hyperlipidemia 11/25/2014    Social History  Substance Use Topics  . Smoking status: Never Smoker   .  Smokeless tobacco: Never Used  . Alcohol Use: No    Family History  Problem Relation Age of Onset  . Adopted: Yes  . Family history unknown: Yes   Allergies  Allergen Reactions  . Compazine [Prochlorperazine] Other (See Comments)    Made pt really lethargic, and sleepy    OBJECTIVE: Filed Vitals:   12/07/14 2252 12/08/14 0219 12/08/14 0532 12/08/14 1011  BP:  152/76 157/81 154/69  Pulse:  95 110 115  Temp:  99.1 F (37.3 C) 98.5 F (36.9 C) 98.4 F (36.9 C)  TempSrc:  Oral Oral Oral  Resp: 25 33 31 28  Height:      Weight:      SpO2:  98% 96% 97%   Body mass index is 24.79 kg/(m^2).  General: He is resting quietly in bed. He is alert but does not appear very interested in talking with me Lungs: Clear Cor: Regular S1 and S2 with no murmurs Abdomen: Soft with minimal diffuse tenderness. Left upper quadrant drain remains in place with 15 mL of output noted yesterday.  Lab Results Lab Results  Component Value Date   WBC 3.6* 12/08/2014   HGB 7.2* 12/08/2014   HCT 22.0* 12/08/2014   MCV 84.0 12/08/2014   PLT 430*  12/08/2014    Lab Results  Component Value Date   CREATININE 1.12 12/07/2014   BUN 37* 12/07/2014   NA 131* 12/07/2014   K 4.5 12/07/2014   CL 103 12/07/2014   CO2 20* 12/07/2014    Lab Results  Component Value Date   ALT 9* 12/07/2014   AST 16 12/07/2014   ALKPHOS 74 12/07/2014   BILITOT 0.6 12/07/2014     Microbiology: Recent Results (from the past 240 hour(s))  Surgical pcr screen     Status: None   Collection Time: 11/29/14  6:02 AM  Result Value Ref Range Status   MRSA, PCR NEGATIVE NEGATIVE Final   Staphylococcus aureus NEGATIVE NEGATIVE Final    Comment:        The Xpert SA Assay (FDA approved for NASAL specimens in patients over 30 years of age), is one component of a comprehensive surveillance program.  Test performance has been validated by [facility] for patients greater than or equal to 18 year old. It is not intended to  diagnose infection nor to guide or monitor treatment.   Culture, routine-abscess     Status: None   Collection Time: 11/29/14 10:23 AM  Result Value Ref Range Status   Specimen Description ABSCESS BACK  Final   Special Requests NONE  Final   Gram Stain   Final    RARE WBC PRESENT, PREDOMINANTLY PMN NO SQUAMOUS EPITHELIAL CELLS SEEN NO ORGANISMS SEEN Performed at [facility]    Culture   Final    NO GROWTH 3 DAYS Performed at [facility]    Report Status 12/02/2014 FINAL  Final  Anaerobic culture     Status: None   Collection Time: 11/29/14 10:25 AM  Result Value Ref Range Status   Specimen Description ABSCESS BACK  Final   Special Requests NONE  Final   Gram Stain   Final    RARE WBC PRESENT, PREDOMINANTLY PMN NO SQUAMOUS EPITHELIAL CELLS SEEN NO ORGANISMS SEEN Performed at [facility]    Culture   Final    NO ANAEROBES ISOLATED Performed at [facility]    Report Status 12/04/2014 FINAL  Final  Body fluid culture     Status: None   Collection Time: 11/29/14 11:42 AM  Result Value Ref Range Status   Specimen Description FLUID PERITONEAL LEFT PERITONEAL FLUID  Final   Special Requests NONE  Final   Gram Stain   Final    FEW WBC PRESENT, PREDOMINANTLY PMN NO ORGANISMS SEEN    Culture   Final    NO GROWTH 3 DAYS Performed at [facility]    Report Status 12/02/2014 FINAL  Final  Anaerobic culture     Status: None   Collection Time: 11/29/14 11:42 AM  Result Value Ref Range Status   Specimen Description FLUID PERITONEAL  Final   Special Requests NONE  Final   Gram Stain   Final    FEW WBC PRESENT, PREDOMINANTLY PMN NO ORGANISMS SEEN    Culture   Final    NO ANAEROBES ISOLATED Performed at [facility]    Report Status 12/04/2014 FINAL  Final     ASSESSMENT: Dr. Barry Dienes informed me that she did not encounter any pus when she explored the chronic left retroperitoneal cavity on 11/29/2014. Those operative Gram  stain and cultures were negative. I will continue IV daptomycin and metronidazole for 5 more days. I will give him a total of 28 days of therapy for  VRE as well as some anaerobic coverage.  PLAN: 1. Continue daptomycin and metronidazole for 5 more days 2. Please call Dr. Lita Mains [phone]) for any infectious disease questions this weekend  [place] pager   [phone] cell 12/08/2014, 10:25 AM

## 2014-12-08 NOTE — Progress Notes (Signed)
ANTIBIOTIC CONSULT NOTE  Pharmacy Consult for Daptomycin Indication: VRE, Retroperitoneal Abscess  Allergies  Allergen Reactions  . Compazine [Prochlorperazine] Other (See Comments)    Made pt really lethargic, and sleepy    Patient Measurements: Height: 5\' 8"  (172.7 cm) Weight: 163 lb (73.936 kg) IBW/kg (Calculated) : 68.4  Vital Signs: Temp: 98.5 F (36.9 C) (09/09 0532) Temp Source: Oral (09/09 0532) BP: 157/81 mmHg (09/09 0532) Pulse Rate: 110 (09/09 0532) Intake/Output from previous day: 09/08 0701 - 09/09 0700 In: 3217.1 [I.V.:85; IV Piggyback:367; TPN:2405.1] Out: 1691 [Urine:1675; Drains:15; Stool:1] Intake/Output from this shift:    Labs:  Recent Labs  12/06/14 0500 12/07/14 0512 12/08/14 0520  WBC 1.9*  --  3.6*  HGB 7.6*  --  7.2*  PLT 377  --  430*  CREATININE  --  1.12  --    Estimated Creatinine Clearance: 59.4 mL/min (by C-G formula based on Cr of 1.12). No results for input(s): VANCOTROUGH, VANCOPEAK, VANCORANDOM, GENTTROUGH, GENTPEAK, GENTRANDOM, TOBRATROUGH, TOBRAPEAK, TOBRARND, AMIKACINPEAK, AMIKACINTROU, AMIKACIN in the last 72 hours.   Microbiology: Recent Results (from the past 720 hour(s))  Culture, routine-abscess     Status: None   Collection Time: 11/10/14  3:15 PM  Result Value Ref Range Status   Specimen Description ABSCESS LEFT RETROPERITONEAL FLUID  Final   Special Requests Normal  Final   Gram Stain   Final    NO WBC SEEN NO SQUAMOUS EPITHELIAL CELLS SEEN NO ORGANISMS SEEN Performed at [facility]    Culture   Final    RARE VANCOMYCIN RESISTANT ENTEROCOCCUS ISOLATED Note: CRITICAL RESULT CALLED TO, READ BACK BY AND VERIFIED WITH: Clarise Cruz RN @ [phone] ON [id number] BY [facility] REPORT FAXED BY REQUEST Performed at [facility]    Report Status 11/15/2014 FINAL  Final   Organism ID, Bacteria VANCOMYCIN RESISTANT ENTEROCOCCUS ISOLATED  Final      Susceptibility   Vancomycin resistant enterococcus isolated - MIC*   VANCOMYCIN 256 RESISTANT Resistant     AMPICILLIN >=32 RESISTANT Resistant     LINEZOLID 2 SENSITIVE Sensitive     * RARE VANCOMYCIN RESISTANT ENTEROCOCCUS ISOLATED  Anaerobic culture     Status: None   Collection Time: 11/10/14  3:15 PM  Result Value Ref Range Status   Specimen Description ABSCESS LEFT RETROPERITONEAL FLUID  Final   Special Requests Normal  Final   Gram Stain   Final    NO WBC SEEN NO SQUAMOUS EPITHELIAL CELLS SEEN NO ORGANISMS SEEN Performed at [facility]    Culture   Final    NO ANAEROBES ISOLATED Performed at [facility]    Report Status 11/16/2014 FINAL  Final  Fungus culture w smear     Status: None (Preliminary result)   Collection Time: 11/10/14  3:15 PM  Result Value Ref Range Status   Specimen Description ABSCESS LEFT RETROPERITONEAL FLUID  Final   Special Requests Normal  Final   Fungal Smear   Final    NO YEAST OR FUNGAL ELEMENTS SEEN Performed at [facility]    Culture   Final    CULTURE IN PROGRESS FOR FOUR WEEKS Performed at [facility]    Report Status PENDING  Incomplete  Culture, routine-abscess     Status: None   Collection Time: 11/27/14 12:22 PM  Result Value Ref Range Status   Specimen Description   Final    ABSCESS ASPIRATION OF RECURRENT LEFT RETROPERITONEAL FLUID. Nephrectomy   Special Requests Normal  Final   Gram Stain   Final    ABUNDANT WBC PRESENT,BOTH PMN AND MONONUCLEAR NO SQUAMOUS EPITHELIAL CELLS SEEN NO ORGANISMS SEEN Performed at [facility]    Culture   Final    NO GROWTH 3 DAYS Performed at [facility]    Report Status 11/30/2014 FINAL  Final  Anaerobic culture     Status: None   Collection Time: 11/27/14 12:22 PM  Result Value Ref Range Status   Specimen Description ABSCESS LEFT RETROPERITONEAL FLUID. Nephrectomy   Final   Special Requests Normal  Final   Gram Stain   Final    ABUNDANT WBC PRESENT,BOTH PMN AND MONONUCLEAR NO SQUAMOUS EPITHELIAL CELLS  SEEN NO ORGANISMS SEEN Performed at [facility]    Culture   Final    NO ANAEROBES ISOLATED Performed at [facility]    Report Status 12/02/2014 FINAL  Final  Fungus culture w smear     Status: None (Preliminary result)   Collection Time: 11/27/14 12:22 PM  Result Value Ref Range Status   Specimen Description ABSCESS LEFT RETROPERITONEAL FLUID/Nephrectomy  Final   Special Requests Normal  Final   Fungal Smear   Final    NO YEAST OR FUNGAL ELEMENTS SEEN Performed at [facility]    Culture   Final    CULTURE IN PROGRESS FOR FOUR WEEKS Performed at [facility]    Report Status PENDING  Incomplete  Surgical pcr screen     Status: None   Collection Time: 11/29/14  6:02 AM  Result Value Ref Range Status   MRSA, PCR NEGATIVE NEGATIVE Final   Staphylococcus aureus NEGATIVE NEGATIVE Final    Comment:        The Xpert SA Assay (FDA approved for NASAL specimens in patients over 1 years of age), is one component of a comprehensive surveillance program.  Test performance has been validated by [facility] for patients greater than or equal to 77 year old. It is not intended to diagnose infection nor to guide or monitor treatment.   Culture, routine-abscess     Status: None   Collection Time: 11/29/14 10:23 AM  Result Value Ref Range Status   Specimen Description ABSCESS BACK  Final   Special Requests NONE  Final   Gram Stain   Final    RARE WBC PRESENT, PREDOMINANTLY PMN NO SQUAMOUS EPITHELIAL CELLS SEEN NO ORGANISMS SEEN Performed at [facility]    Culture   Final    NO GROWTH 3 DAYS Performed at [facility]    Report Status 12/02/2014 FINAL  Final  Anaerobic culture     Status: None   Collection Time: 11/29/14 10:25 AM  Result Value Ref Range Status   Specimen Description ABSCESS BACK  Final   Special Requests NONE  Final   Gram Stain   Final    RARE WBC PRESENT, PREDOMINANTLY PMN NO SQUAMOUS EPITHELIAL CELLS  SEEN NO ORGANISMS SEEN Performed at [facility]    Culture   Final    NO ANAEROBES ISOLATED Performed at [facility]    Report Status 12/04/2014 FINAL  Final  Body fluid culture     Status: None   Collection Time: 11/29/14 11:42 AM  Result Value Ref Range Status   Specimen Description FLUID PERITONEAL LEFT PERITONEAL FLUID  Final   Special Requests NONE  Final   Gram Stain   Final    FEW WBC PRESENT, PREDOMINANTLY PMN NO ORGANISMS SEEN  Culture   Final    NO GROWTH 3 DAYS Performed at [facility]    Report Status 12/02/2014 FINAL  Final  Anaerobic culture     Status: None   Collection Time: 11/29/14 11:42 AM  Result Value Ref Range Status   Specimen Description FLUID PERITONEAL  Final   Special Requests NONE  Final   Gram Stain   Final    FEW WBC PRESENT, PREDOMINANTLY PMN NO ORGANISMS SEEN    Culture   Final    NO ANAEROBES ISOLATED Performed at [facility]    Report Status 12/04/2014 FINAL  Final    Medical History: Past Medical History  Diagnosis Date  . Hypertension   . Anginal pain     hx of CP saw Dr Wynonia Lawman 09/20/2013 H&P per chart   . Left Retroperitoneal mass s/p resection/L nephrectomy 07/13/2014   . Postoperative anemia due to acute blood loss 07/15/2014  . SBO (small bowel obstruction) 08/01/2014, 10/11/14  . Retroperitoneal fluid collection   . 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 Impaired fasting glucose 11/25/2014  . Benign neoplasm of colon 11/25/2014  . Actinic keratosis 11/25/2014  . 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 Patient underwent repeat aspiration of fluid on 8/29.  Linezolid changed to daptomycin on 9/4 d/t suspicion for drug induced leukopenia and anemia.  Today, 12/08/2014: - Tmax 99.1, wbc improved to 3.6 - SCr 1.12 on 9/8 (CrCl ~ 55-60 ml/min)  PTA Linezolid, Metronidazole since 11/15/14 - changed from zyvox to dapto d/t suspicion for zyvox induced myelosuppression 8/17 >> Linezolid >> 9/4 8/17 >> Flagyl >> 9/4 >> Daptomycin Rx>>   - CK 20 on 9/04   8/12 L retroperitoneal fluid abscess: VRE (S = Linezolid) 8/29 abd fluid abscess: NGF 8/29 anaerobic abd fluid abscess: NGF 8/29 fungus abd fluid abscess: no yeast or fungal elements seen, cx in progress x 4 wks 8/31 peritoneal fluid: NGF 8/31 anaerobic peritoneal fluid: neg FINAL 8/31 back abscess: NGF 8/31 anaerobic back abscess: neg FINAL 8/31 MRSA PCR: negative  Goal of Therapy:  Appropriate antibiotic dosing for renal function and indication Eradication of infection  Plan:   Continue Daptomycin 600 mg (~8 mg/kg) IV q24h.  Monitor weekly CK levels while on Daptomycin therapy.   Continue Flagyl 500 mg IV q8h per ID recs.  Monitor renal function, cultures, clinical course.  Duration of therapy per ID/surgery  Netta Cedars, PharmD, BCPS Pager: [phone] 12/08/2014 8:14 AM

## 2014-12-08 NOTE — Progress Notes (Addendum)
Clinical Social Work  CSW spoke with wife and dtr [phone]) who report SNF placement is desired in [place], [place]. Patient and family aware of [facility]AR limitations before they would have to pay privately and feel they could transport patient to SNF. Family is requesting placement [facility] in [place] [phone]). CSW left a message with admissions. CSW placed FL2 on chart for MD signature.  Sindy Messing, [place] [phone]  Addendum 1540: CSW spoke with dtr who gathered information for admissions [facility] [phone]) who reports they will review referral and asked CSW to fax information to [phone]. SNF to review and will call CSW back.

## 2014-12-09 LAB — GLUCOSE, CAPILLARY
GLUCOSE-CAPILLARY: 154 mg/dL — AB (ref 65–99)
GLUCOSE-CAPILLARY: 159 mg/dL — AB (ref 65–99)
GLUCOSE-CAPILLARY: 160 mg/dL — AB (ref 65–99)
Glucose-Capillary: 152 mg/dL — ABNORMAL HIGH (ref 65–99)

## 2014-12-09 LAB — TYPE AND SCREEN
ABO/RH(D): O NEG
Antibody Screen: NEGATIVE
Unit division: 0
Unit division: 0

## 2014-12-09 MED ORDER — TRACE MINERALS CR-CU-MN-SE-ZN 10-1000-500-60 MCG/ML IV SOLN
INTRAVENOUS | Status: AC
  Administered 2014-12-09: 17:00:00 via INTRAVENOUS
  Filled 2014-12-09: qty 2232

## 2014-12-09 MED ORDER — FAT EMULSION 20 % IV EMUL
240.0000 mL | INTRAVENOUS | Status: AC
  Administered 2014-12-09: 240 mL via INTRAVENOUS
  Filled 2014-12-09: qty 250

## 2014-12-09 NOTE — Progress Notes (Signed)
10 Days Post-Op  Subjective: Patient is doing well with his NG tube out Had a small BM last night, per his report No nausea or vomiting  Objective: Vital signs in last 24 hours: Temp:  [98.3 F (36.8 C)-99.6 F (37.6 C)] 98.9 F (37.2 C) (09/10 0618) Pulse Rate:  [97-115] 104 (09/10 0618) Resp:  [22-50] 40 (09/10 0845) BP: (144-156)/(60-77) 147/76 mmHg (09/10 0618) SpO2:  [97 %-100 %] 97 % (09/10 0845) Last BM Date: 12/08/14  Intake/Output from previous day: 09/09 0701 - 09/10 0700 In: 3317.5 [P.O.:300; I.V.:155.3; Blood:384.5; IV Piggyback:417; TPN:2060.6] Out: 6387 [FIEPP:2951; Drains:32] Intake/Output this shift:    General appearance: alert, cooperative and no distress Resp: clear to auscultation bilaterally Cardio: regular rate and rhythm, S1, S2 normal, no murmur, click, rub or gallop GI: soft, moderately tender; drain with brown serous fluid Staple line - moderate erythema and irritation around staples  Lab Results:   Recent Labs  12/08/14 0520  WBC 3.6*  HGB 7.2*  HCT 22.0*  PLT 430*   BMET  Recent Labs  12/07/14 0512  NA 131*  K 4.5  CL 103  CO2 20*  GLUCOSE 148*  BUN 37*  CREATININE 1.12  CALCIUM 7.8*   PT/INR No results for input(s): LABPROT, INR in the last 72 hours. ABG No results for input(s): PHART, HCO3 in the last 72 hours.  Invalid input(s): PCO2, PO2  Studies/Results: No results found.  Anti-infectives: Anti-infectives    Start     Dose/Rate Route Frequency Ordered Stop   12/03/14 1300  DAPTOmycin (CUBICIN) 600 mg in sodium chloride 0.9 % IVPB     600 mg 224 mL/hr over 30 Minutes Intravenous Every 24 hours 12/03/14 1157     11/29/14 0830  [MAR Hold]  ceFAZolin (ANCEF) IVPB 2 g/50 mL premix     (MAR Hold since 11/29/14 0825)   2 g 100 mL/hr over 30 Minutes Intravenous On call to O.R. 11/28/14 0850 11/29/14 0908   11/25/14 0000  metroNIDAZOLE (FLAGYL) IVPB 500 mg     500 mg 100 mL/hr over 60 Minutes Intravenous Every 8 hours  11/24/14 2355     11/25/14 0000  linezolid (ZYVOX) IVPB 600 mg  Status:  Discontinued     600 mg 300 mL/hr over 60 Minutes Intravenous Every 12 hours 11/24/14 2355 12/03/14 1145      Assessment/Plan: s/p Procedure(s): EXPLORATORY LAPAROTOMY SMALL BOWEL RESECTION (N/A) IRRIGATION AND DEBRIDEMENT BACK ABSCESS (N/A) Continue abx/ drains  Clears today - advance tomorrow if tolerating Watch wound closely - may need to have staples removed if erythema worsens.   LOS: 15 days    Ronel Rodeheaver K. 12/09/2014

## 2014-12-09 NOTE — Progress Notes (Signed)
Darryl Griffith was awake and alert when I arrived. His wife was bedside. Darryl Griffith expressed how they were in the process of moving to [facility] (his dream) and new house to be closer to children/grandchild. She expressed how his illness has caused her to wonder why and how it has challenged her patience while admitting she is not patient. She said she is getting prescription for herself to help with the stress. Darryl Griffith was quiet during most of our visit but admitted he just doesn't feel good. Darryl Griffith spoke of her/their [employer]. Provided support and presence as she needed an outlet to express how she feels on this journey and season with her husband's illness. She said he has been in the hospital every month since April. Both were very pleasant and appreciative of Chaplain visit and prayer.  Please page when additional support is needed. [place] [phone]    12/09/14 1400  Clinical Encounter Type  Visited With Patient and family together

## 2014-12-09 NOTE — Progress Notes (Signed)
Progress Note   Darryl Griffith SKA:[id number] DOB: [birth year] DOA: 11/24/2014 PCP: Gennette Pac, MD   Brief Narrative:   Darryl Griffith is an 69 y.o. 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 General surgery was consulted on admission. Patient did not improve with conservative measures and underwent surgery 11/29/14: incision and drainage of left back abscess, exploratory laparotomy, lysis of adhesions, small bowel resection and placement of drain into left retroperitoneal abscess.   Assessment/Plan:   Principal problem:  SBO -Underwent surgical repair on 8/31 with exploratory laparotomy, lysis of adhesions, small bowel resection, placement of drain into left retroperitoneal abscess.  - Gen. surgery following.  Diet advanced to clears. NG tube out. Remains on TNA.  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 Follow-up oncology post discharge.  Left retroperitoneal and left upper quadrant fluid collection  - Cultures initially positive for VRE. Most recent cultures from 11/29/14 were negative. - Status post IR drainage 11/27/14. - ID following. Antibiotics per ID recommendations, will complete course of daptomycin/Flagyl on 12/13/14.  HTN - Continue hydralazine as needed.  Hypokalemia - Resolved.  Hypomagnesemia - Resolved.  Symptomatic Anemia - Monitoring blood counts.  - Status post 2 units of PRBCs 11/27/14, 1 unit 12/01/14 and 1 unit 12/05/14. - Tachycardic and dyspneic today with a hemoglobin of 7.2. We'll give an additional unit of PRBCs.  Hyponatremia - Monitor sodium, improved.    Neutropenia - Felt to be related to treatment side Vioxx. Changed to cubicin after speaking with Dr Tommy Medal. Discussed with pharmacy.   Tachycardia - Multi-factorial with infection, anemia contributory.  DVT prophylaxis - SCDs.  Code Status: Full Code Family Communication: Wife updated at bedside and daughter updated by telephone 12/08/14.  Disposition Plan: Unclear at present, remains on TNA. Possibly will need SNF when medically stable.  IV Access:    PICC line placed 10/12/14   Procedures and diagnostic studies:   Ct Chest W Contrast  11/23/2014   CLINICAL DATA:  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  CONTRAST:  141mL OMNIPAQUE IOHEXOL 300 MG/ML  SOLN  COMPARISON:  11/07/2014  FINDINGS: CT CHEST FINDINGS  Thoracic inlet: No neck base or axillary masses or adenopathy. Normal thyroid.  Mediastinum and hila: Heart normal in size and configuration. Mild prominence of the ascending aorta measuring 4 cm in diameter. 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 Right PICC tip projects in the lower superior vena cava. No mediastinal or hilar masses or pathologically enlarged lymph nodes.  Lungs and pleura: No lung masses or suspicious nodules. Small left pleural effusion associated with mild dependent left lower lobe atelectasis. Small focus of reticular nodular opacity in the posterior right lower lobe is likely additional atelectasis or scarring. There is no lung consolidation or edema. No pneumothorax.  CT ABDOMEN  AND PELVIS  FINDINGS  There is a fluid collection that extends from the posterior medial left superior retroperitoneum, adjacent to the spleen, to the lateral margin of the superior left psoas muscle. This measures 4.9 x 2.2 cm transversely, previously 5.6 x 2.6 cm. It measures 7.6 cm an approximate superior inferior length, previously 9.2 cm. Fluid attenuation and multiple blastic and clips lie in the left upper quadrant between the posterior wall of the stomach and upper abdominal aorta. There is thickening of this portion of the stomach wall. These changes partly surround the pancreatic tail and surround the left diaphragmatic crus. No left adrenal gland is visible. Left kidney is surgically absent.  Liver and spleen:  Unremarkable.  Gallbladder and biliary tree: Unremarkable.  Pancreas: There is some heterogeneous lower attenuation along the pancreatic tail adjacent to the left upper quadrant surgical changes and low-attenuation soft tissue. This is similar to the prior exam, likely reactive edema. No other pancreatic abnormality.  Right adrenal glands: Normal.  Right kidney, ureter, bladder: 15 mm midpole cyst. No other right renal masses, no stones and no hydronephrosis. Normal ureters. Unremarkable bladder.  Lymph nodes:  No pathologically enlarged lymph nodes.  Ascites:  Trace ascites in the pelvis.  Gastrointestinal: There is a partial small bowel obstruction. The transition point is in the left upper adjacent to the postsurgical changes and residual left retroperitoneal fluid collection. Small bowel proximal to the transition is dilated to a maximum of 5.3 cm. Small bowel distal to this is decompressed. The colon is partly decompressed. There are scattered colonic diverticula mostly along the sigmoid colon. No diverticulitis. Normal-sized appendix is visualized. No bowel wall thickening or inflammatory changes. No free air.  Abdominal wall: There is edema in the subcutaneous fat mostly along the flanks. There is  midline abdominal incision. No hernias.  Vascular: Atherosclerotic changes are noted along the infrarenal abdominal aorta and branch vessels. No aneurysm.  MUSCULOSKELETAL FINDINGS  No osteoblastic or osteolytic lesions. Degenerative changes noted of the visualized spine. Bones are demineralized.  IMPRESSION: 1. Residual left retroperitoneal and left upper quadrant collection associated with postsurgical changes. The defined residual collection, along the posterior medial left retroperitoneum abutting the spleen and left psoas muscle, is smaller, now measuring 4.9 x 2.3 x 7.6 cm, previously 5.6 x 2.6 x 9.2 cm. Left upper quadrant postsurgical and inflammatory changes include thickening of the posterior wall of the stomach, stable. There are numerous vascular clips in the left retroperitoneum and left posterior medial upper quadrant. 2. Fairly high-grade partial small bowel obstruction with the transition point in the posterior medial left upper quadrant adjacent to the residual collection and postsurgical changes. This is similar to prior study. 3. No convincing local residual retroperitoneal sarcoma. 4. No evidence of metastatic disease from the sarcoma.   Electronically Signed   By: Lajean Manes M.D.   On: 11/23/2014 14:36   Ct Abdomen Pelvis W Contrast  11/23/2014   CLINICAL DATA:  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  CONTRAST:  140mL OMNIPAQUE IOHEXOL 300 MG/ML  SOLN  COMPARISON:  11/07/2014  FINDINGS: CT CHEST FINDINGS  Thoracic inlet: No neck base or axillary masses or adenopathy. Normal thyroid.  Mediastinum  and hila: Heart  normal in size and configuration. Mild prominence of the ascending aorta measuring 4 cm in diameter. 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 Right PICC tip projects in the lower superior vena cava. No mediastinal or hilar masses or pathologically enlarged lymph nodes.  Lungs and pleura: No lung masses or suspicious nodules. Small left pleural effusion associated with mild dependent left lower lobe atelectasis. Small focus of reticular nodular opacity in the posterior right lower lobe is likely additional atelectasis or scarring. There is no lung consolidation or edema. No pneumothorax.  CT ABDOMEN AND PELVIS FINDINGS  There is a fluid collection that extends from the posterior medial left superior retroperitoneum, adjacent to the spleen, to the lateral margin of the superior left psoas muscle. This measures 4.9 x 2.2 cm transversely, previously 5.6 x 2.6 cm. It measures 7.6 cm an approximate superior inferior length, previously 9.2 cm. Fluid attenuation and multiple blastic and clips lie in the left upper quadrant between the posterior wall of the stomach and upper abdominal aorta. There is thickening of this portion of the stomach wall. These changes partly surround the pancreatic tail and surround the left diaphragmatic crus. No left adrenal gland is visible. Left kidney is surgically absent.  Liver and spleen:  Unremarkable.  Gallbladder and biliary tree: Unremarkable.  Pancreas: There is some heterogeneous lower attenuation along the pancreatic tail adjacent to the left upper quadrant surgical changes and low-attenuation soft tissue. This is similar to the prior exam, likely reactive edema. No other pancreatic abnormality.  Right adrenal glands: Normal.  Right kidney, ureter, bladder: 15 mm midpole cyst. No other right renal masses, no stones and no hydronephrosis. Normal ureters. Unremarkable bladder.  Lymph nodes:   No pathologically enlarged lymph nodes.  Ascites:  Trace ascites in the pelvis.  Gastrointestinal: There is a partial small bowel obstruction. The transition point is in the left upper adjacent to the postsurgical changes and residual left retroperitoneal fluid collection. Small bowel proximal to the transition is dilated to a maximum of 5.3 cm. Small bowel distal to this is decompressed. The colon is partly decompressed. There are scattered colonic diverticula mostly along the sigmoid colon. No diverticulitis. Normal-sized appendix is visualized. No bowel wall thickening or inflammatory changes. No free air.  Abdominal wall: There is edema in the subcutaneous fat mostly along the flanks. There is midline abdominal incision. No hernias.  Vascular: Atherosclerotic changes are noted along the infrarenal abdominal aorta and branch vessels. No aneurysm.  MUSCULOSKELETAL FINDINGS  No osteoblastic or osteolytic lesions. Degenerative changes noted of the visualized spine. Bones are demineralized.  IMPRESSION: 1. Residual left retroperitoneal and left upper quadrant collection associated with postsurgical changes. The defined residual collection, along the posterior medial left retroperitoneum abutting the spleen and left psoas muscle, is smaller, now measuring 4.9 x 2.3 x 7.6 cm, previously 5.6 x 2.6 x 9.2 cm. Left upper quadrant postsurgical and inflammatory changes include thickening of the posterior wall of the stomach, stable. There are numerous vascular clips in the left retroperitoneum and left posterior medial upper quadrant. 2. Fairly high-grade partial small bowel obstruction with the transition point in the posterior medial left upper quadrant adjacent to the residual collection and postsurgical changes. This is similar to prior study. 3. No convincing local residual retroperitoneal sarcoma. 4. No evidence of metastatic disease from the sarcoma.   Electronically Signed   By: Dedra Skeens.D.  On: 11/23/2014  14:36   Ct Aspiration  11/28/2014   CLINICAL DATA:  Recurrent left retroperitoneal fluid collection after previous resection of sarcoma. The patient now presents with a small bowel obstruction. Aspiration of the fluid collection has been requested prior to planned surgery to determine if there is any evidence of infection. This fluid collection was last aspirated on 11/10/2014 revealing vancomycin resistant Enterococcus and also previously contained a percutaneous drain for several months.  EXAM: CT GUIDED CATHETER DRAINAGE OF LEFT RETROPERITONEAL FLUID COLLECTION  ANESTHESIA/SEDATION: 1.5  Mg IV Versed; 75 mcg IV Fentanyl  Total Moderate Sedation Time: 15 minutes  PROCEDURE: The procedure risks, benefits, and alternatives were explained to the patient. Questions regarding the procedure were encouraged and answered. The patient understands and consents to the procedure. A time-out was performed prior to the procedure.  The left flank region was prepped with Betadine in a sterile fashion, and a sterile drape was applied covering the operative field. A sterile gown and sterile gloves were used for the procedure. Local anesthesia was provided with 1% Lidocaine.  Under CT guidance, a 5 [place] Yueh centesis catheter was advanced into the left retroperitoneal space. Fluid aspiration was performed through the 5 French catheter. The catheter was then removed. Additional CT was performed.  COMPLICATIONS: None  FINDINGS: Recurrent fluid collection in the medial left retroperitoneal space again identified, identical in appearance to scanning just prior to the previous aspiration procedure on 08/12. Approximately 10 mL of pink colored, turbid fluid was able to be aspirated. Material was sent for culture analysis. Postprocedural CT shows decrease in size of the fluid collection.  IMPRESSION: CT-guided aspiration of recurrent medial left retroperitoneal fluid collection yielding 10 mL of pink colored, turbid fluid. Fluid was  sent for culture analysis.   Electronically Signed   By: Aletta Edouard M.D.   On: 11/28/2014 08:21   Ct Aspiration  11/10/2014   CLINICAL DATA:  History of recurrent left retroperitoneal fluid collection status post surgical excision of a retroperitoneal liposarcoma. Most recently, a drainage catheter was placed on 08/01/2014 and remained in place for 3 months with removal of the drain on 10/31/2014. Imaging on 11/07/2014 for abdominal pain demonstrated some reaccumulation of fluid in the previously drained medial left retroperitoneal space. Aspiration under CT guidance is now performed to determine if the fluid is infected.  EXAM: CT GUIDED ASPIRATION OF LEFT RETROPERITONEAL FLUID COLLECTION  ANESTHESIA/SEDATION: 3.0 Mg IV Versed 75 mcg IV Fentanyl  Total Moderate Sedation Time:  9 minutes.  PROCEDURE: The procedure, risks, benefits, and alternatives were explained to the patient. Questions regarding the procedure were encouraged and answered. The patient understands and consents to the procedure. A time-out was performed prior to the procedure.  The left flank region was prepped with Betadine in a sterile fashion, and a sterile drape was applied covering the operative field. A sterile gown and sterile gloves were used for the procedure. Local anesthesia was provided with 1% Lidocaine.  CT was performed in a prone position. Under CT guidance, a 5 [place] Yueh centesis catheter was advanced over a 19 gauge needle into the left retroperitoneal fluid collection. Aspiration was performed through the [facility] catheter. The aspirated fluid sample was sent for culture analysis including a aerobic culture, anaerobic culture and fungal culture. Additional saline lavage was performed through the catheter with 10 mL of sterile saline. CT was performed after catheter removal.  COMPLICATIONS: None  FINDINGS: Aspiration yielded turbid yellow fluid. A total of 20 mL of fluid  was able to be aspirated from the collection. After  aspiration and additional saline lavage, fluid cavity size was smaller with a small amount of fluid remaining.  IMPRESSION: CT-guided aspiration and drainage of recurrent left retroperitoneal fluid collection. A 20 mL volume of turbid yellow fluid was able to be removed and was sent for culture analysis. Given chronicity of the collection and the presence of a drain in the collection for the last 3 months, aspiration only was performed to determine if the collection is infected prior to committing the patient to potential additional long-term catheter drainage or surgery.   Electronically Signed   By: Aletta Edouard M.D.   On: 11/10/2014 17:16   Ir Venipuncture 77yrs/older By Md  12/01/2014   CLINICAL DATA:  Anemia, difficult access, poor peripheral veins, access for blood transfusion  EXAM: ULTRASOUND GUIDANCE FOR VASCULAR ACCESS  PERIPHERAL IV START BY PHYSICIAN.  Date:  9/2/20169/05/2014 5:13 pm  Radiologist:  M. Daryll Brod, MD  Guidance:  Ultrasound  FLUOROSCOPY TIME:  None.  MEDICATIONS AND MEDICAL HISTORY: None.  ANESTHESIA/SEDATION: None.  CONTRAST:  None.  COMPLICATIONS: None immediate  PROCEDURE: Informed consent was obtained from the patient following explanation of the procedure, risks, benefits and alternatives. The patient understands, agrees and consents for the procedure. All questions were addressed. A time out was performed.  Maximal barrier sterile technique utilized including caps, mask, sterile gowns, sterile gloves, large sterile drape, hand hygiene, and ChloraPrep  Preliminary ultrasound performed. Left basilic vein demonstrated. Under sterile conditions and local anesthesia, a 16 gauge Angiocath was advanced into the left basilic vein under ultrasound. Position confirmed with ultrasound. Images obtained for documentation. Blood aspirated easily followed by saline flush. This was secured externally with a sterile dressing. Ready for use.  IMPRESSION: Successful ultrasound left upper  extremity basilic vein peripheral IV start.   Electronically Signed   By: [name] M.D.   On: 12/01/2014 17:19   Ir US Guide Vasc Access Left  12/01/2014   CLINICAL DATA:  Anemia, difficult access, poor peripheral veins, access for blood transfusion  EXAM: ULTRASOUND GUIDANCE FOR VASCULAR ACCESS  PERIPHERAL IV START BY PHYSICIAN.  Date:  9/2/20169/05/2014 5:13 pm  Radiologist:  M. Daryll Brod, MD  Guidance:  Ultrasound  FLUOROSCOPY TIME:  None.  MEDICATIONS AND MEDICAL HISTORY: None.  ANESTHESIA/SEDATION: None.  CONTRAST:  None.  COMPLICATIONS: None immediate  PROCEDURE: Informed consent was obtained from the patient following explanation of the procedure, risks, benefits and alternatives. The patient understands, agrees and consents for the procedure. All questions were addressed. A time out was performed.  Maximal barrier sterile technique utilized including caps, mask, sterile gowns, sterile gloves, large sterile drape, hand hygiene, and ChloraPrep  Preliminary ultrasound performed. Left basilic vein demonstrated. Under sterile conditions and local anesthesia, a 16 gauge Angiocath was advanced into the left basilic vein under ultrasound. Position confirmed with ultrasound. Images obtained for documentation. Blood aspirated easily followed by saline flush. This was secured externally with a sterile dressing. Ready for use.  IMPRESSION: Successful ultrasound left upper extremity basilic vein peripheral IV start.   Electronically Signed   By: [name] M.D.   On: 12/01/2014 17:19   Dg Abd 2 Views  11/27/2014   CLINICAL DATA:  Followup small bowel obstruction  EXAM: ABDOMEN - 2 VIEW  COMPARISON:  11/26/2014  FINDINGS: The nasogastric tube is identified with tip in the stomach. The side port is below the GE junction. Dilated loops of small bowel and air-fluid levels  are again noted. These measure up to 6.7 cm. Enteric contrast material is identified within the colon up to the level of the rectum.  IMPRESSION: 1.  NG tube is within the stomach. 2. Persistent partial small bowel obstruction 3. Enteric contrast material is identified up the level of the rectum.   Electronically Signed   By: Kerby Moors M.D.   On: 11/27/2014 09:35   Dg Abd 2 Views  11/26/2014   CLINICAL DATA:  Abdominal distention with nausea and vomiting. Surgery in April. Small bowel obstruction symptoms.  EXAM: ABDOMEN - 2 VIEW  COMPARISON:  11/24/2014  FINDINGS: Upright and supine views. Upright view demonstrates persistent small bowel air-fluid levels, increased. Small left pleural effusion. There may be a nasogastric tube looped in the esophagus, incompletely imaged. Surgical clips in the upper abdomen. A metallic object projecting over the a lower chest is presumably external to the patient.  Supine images demonstrate small bowel distension. Example loop measures 6.7 cm today, versus 6.4 cm on the prior. Contrast is identified within the colon. No pneumatosis.  IMPRESSION: Similar small bowel obstruction pattern, without free intraperitoneal air or other acute complication.  The nasogastric may be looped in the distal esophagus. Incompletely imaged. These results will be called to the ordering clinician or representative by the Radiologist Assistant, and communication documented in the PACS or zVision Dashboard.   Electronically Signed   By: Abigail Miyamoto M.D.   On: 11/26/2014 10:07   Dg Abd Acute W/chest  11/24/2014   CLINICAL DATA:  Bloating and nausea since yesterday. Vomiting today. History of bowel obstructions. History of retroperitoneal liposarcoma.  EXAM: DG ABDOMEN ACUTE W/ 1V CHEST  COMPARISON:  CT abdomen and pelvis 11/23/2014  FINDINGS: Right PICC catheter tip over the low SVC region. No pneumothorax. Linear fibrosis or atelectasis in the left lung base. No focal airspace disease or consolidation in the lungs. Normal heart size and pulmonary vascularity.  Surgical clips in the upper abdomen. Contrast material is demonstrated in the  right colon. There are prominent gas-filled distended small bowel loops in the mid abdomen and multiple air-fluid levels are demonstrated on the upright view. Appearance is consistent with mechanical small bowel obstruction. No free intra-abdominal air. No radiopaque stones. Degenerative changes in the spine.  IMPRESSION: No evidence of active pulmonary disease. Fibrosis or atelectasis in the left lung base.  Gaseous distention of mid abdominal small bowel with multiple air-fluid levels consistent with mechanical small bowel obstruction.   Electronically Signed   By: Lucienne Capers M.D.   On: 11/24/2014 21:51   Dg Abd Portable 1v  11/28/2014   CLINICAL DATA:  Evaluate NG tube repositioning.  EXAM: PORTABLE ABDOMEN - 1 VIEW  COMPARISON:  11/27/2014  FINDINGS: The nasogastric tube has been repositioned. The tip is in the fundus of the stomach in the side port is below the GE junction. Surgical clips are noted in the left upper quadrant of the abdomen. Again noted are dilated loops of bowel measuring up to 5 cm.  IMPRESSION: 1. Nasogastric tube is in the stomach. 2. Persistent small bowel dilatation consistent with obstruction.   Electronically Signed   By: Kerby Moors M.D.   On: 11/28/2014 09:05   Dg Abd Portable 1v  11/26/2014   CLINICAL DATA:  Nasogastric tube placement  EXAM: PORTABLE ABDOMEN - 1 VIEW  COMPARISON:  Portable exam 1318 hours compared to 11/26/2014  FINDINGS: Tip of nasogastric tube projects over proximal stomach.  Numerous surgical clips in the perigastric  region.  Multiple dilated loops of small bowel again identified compatible with small bowel obstruction.  Small amount of contrast within a decompressed colon.  No definite bowel wall thickening.  Lung bases clear.  IMPRESSION: Small bowel obstruction.  Tip of nasogastric tube projects over proximal stomach.   Electronically Signed   By: Lavonia Dana M.D.   On: 11/26/2014 16:49     Medical Consultants:    Infectious Disease:  Carlyle Basques, MD  General Surgery: Alphonsa Overall, MD  Anti-Infectives:    Daptomycin  Flagyl  Subjective:   Darryl Griffith reports ongoing continuous abdominal pain.  No N/V.  Passing flatus.  Objective:    Filed Vitals:   12/09/14 0201 12/09/14 0211 12/09/14 0500 12/09/14 0618  BP:  156/75  147/76  Pulse:  109  104  Temp:  98.3 F (36.8 C)  98.9 F (37.2 C)  TempSrc:  Oral  Oral  Resp: 50 27 26   Height:      Weight:      SpO2: 99% 99% 98% 99%    Intake/Output Summary (Last 24 hours) at 12/09/14 0816 Last data filed at 12/09/14 8182  Gross per 24 hour  Intake 3317.46 ml  Output   1907 ml  Net 1410.46 ml   Filed Weights   11/24/14 2020  Weight: 73.936 kg (163 lb)    Exam: Gen:  NAD, depressed affect Cardiovascular: Tachycardic, No M/R/G Respiratory:  Lungs CTAB Gastrointestinal:  Abdomen soft, NT/ND, + BS, abdominal dressings intact Extremities:  No C/E/C   Data Reviewed:    Labs: Basic Metabolic Panel:  Recent Labs Lab 12/03/14 1235 12/04/14 0600 12/07/14 0512  NA  --  130* 131*  K  --  4.0 4.5  CL  --  100* 103  CO2  --  24 20*  GLUCOSE  --  130* 148*  BUN  --  37* 37*  CREATININE 1.21 1.13 1.12  CALCIUM  --  8.2* 7.8*  MG  --  2.1 1.9  PHOS  --  4.0 3.3   GFR Estimated Creatinine Clearance: 59.4 mL/min (by C-G formula based on Cr of 1.12). Liver Function Tests:  Recent Labs Lab 12/04/14 0600 12/07/14 0512  AST 16 16  ALT 8* 9*  ALKPHOS 53 74  BILITOT 0.6 0.6  PROT 5.1* 5.0*  ALBUMIN 2.4* 2.1*   CBC:  Recent Labs Lab 12/02/14 1140 12/03/14 0528 12/04/14 0600 12/05/14 0510 12/06/14 0500 12/08/14 0520  WBC 1.6* 1.6* 1.2* 1.2* 1.9* 3.6*  NEUTROABS 0.7* 0.6* 0.3* 0.2*  --  1.4*  HGB 7.0* 7.4* 7.2* 6.6* 7.6* 7.2*  HCT 21.0* 22.3* 21.6* 20.2* 23.4* 22.0*  MCV 82.7 82.3 82.4 82.1 82.7 84.0  PLT 281 314 316 315 377 430*   Cardiac Enzymes:  Recent Labs Lab 12/03/14 1235  CKTOTAL 20*   CBG:  Recent Labs Lab  12/08/14 0535 12/08/14 1206 12/08/14 1818 12/09/14 0017 12/09/14 0615  GLUCAP 146* 148* 158* 154* 159*   Lipid Profile: No results for input(s): CHOL, HDL, LDLCALC, TRIG, CHOLHDL, LDLDIRECT in the last 72 hours. Microbiology Recent Results (from the past 240 hour(s))  Culture, routine-abscess     Status: None   Collection Time: 11/29/14 10:23 AM  Result Value Ref Range Status   Specimen Description ABSCESS BACK  Final   Special Requests NONE  Final   Gram Stain   Final    RARE WBC PRESENT, PREDOMINANTLY PMN NO SQUAMOUS EPITHELIAL CELLS SEEN NO ORGANISMS SEEN Performed at [facility]    Culture   Final    NO GROWTH 3 DAYS Performed at [facility]    Report Status 12/02/2014 FINAL  Final  Anaerobic culture     Status: None   Collection Time: 11/29/14 10:25 AM  Result Value Ref Range Status   Specimen Description ABSCESS BACK  Final   Special Requests NONE  Final   Gram Stain   Final    RARE WBC PRESENT, PREDOMINANTLY PMN NO SQUAMOUS EPITHELIAL CELLS SEEN NO ORGANISMS SEEN Performed at [facility]    Culture   Final    NO ANAEROBES ISOLATED Performed at [facility]    Report Status 12/04/2014 FINAL  Final  Body fluid culture     Status: None   Collection Time: 11/29/14 11:42 AM  Result Value Ref Range Status   Specimen Description FLUID PERITONEAL LEFT PERITONEAL FLUID  Final   Special Requests NONE  Final   Gram Stain   Final    FEW WBC PRESENT, PREDOMINANTLY PMN NO ORGANISMS SEEN    Culture   Final    NO GROWTH 3 DAYS Performed at [facility]    Report Status 12/02/2014 FINAL  Final  Anaerobic culture     Status: None   Collection Time: 11/29/14 11:42 AM  Result Value Ref Range Status   Specimen Description FLUID PERITONEAL  Final   Special Requests NONE  Final   Gram Stain   Final    FEW WBC PRESENT, PREDOMINANTLY PMN NO ORGANISMS SEEN    Culture   Final    NO ANAEROBES ISOLATED Performed at [facility]    Report Status 12/04/2014 FINAL  Final     Medications:   . sodium chloride   Intravenous Once  . bisacodyl  10 mg Rectal Daily  . DAPTOmycin (CUBICIN)  IV  600 mg Intravenous Q24H  . heparin subcutaneous  5,000 Units Subcutaneous 3 times per day  . HYDROmorphone PCA 0.3 mg/mL   Intravenous 6 times per day  . insulin aspart  0-9 Units Subcutaneous 4 times per day  . lip balm  1 application Topical BID  . methocarbamol (ROBAXIN)  IV  500 mg Intravenous 3 times per day  . metronidazole  500 mg Intravenous Q8H  . pantoprazole (PROTONIX) IV  40 mg Intravenous Q12H   Continuous Infusions: . sodium chloride 10 mL/hr at 11/30/14 0426  . Marland KitchenTPN (CLINIMIX-E) Adult 93 mL/hr at 12/08/14 1801   And  . fat emulsion 240 mL (12/08/14 1801)    Time spent: 25 minutes.    LOS: 15 days   [facility] Pager [phone]. If unable to reach me by pager, please call my cell phone at [phone].  *Please refer to [email or web address], password [email or web address] to get updated schedule on who will round on this patient, as hospitalists switch teams weekly. If 7PM-7AM, please contact night-coverage at [email or web address], password [email or web address] for any overnight needs.  12/09/2014, 8:16 AM

## 2014-12-09 NOTE — Progress Notes (Signed)
PARENTERAL NUTRITION CONSULT NOTE - Follow-Up  Pharmacy Consult for TPN Indication: SBO  Allergies  Allergen Reactions  . Compazine [Prochlorperazine] Other (See Comments)    Made pt really lethargic, and sleepy   Patient Measurements: Height: 5' 8" (172.7 cm) Weight: 163 lb (73.936 kg) IBW/kg (Calculated) : 68.4  Vital Signs: Temp: 98.9 F (37.2 C) (09/10 0618) Temp Source: Oral (09/10 0618) BP: 147/76 mmHg (09/10 0618) Pulse Rate: 104 (09/10 0618) Intake/Output from previous day: 09/09 0701 - 09/10 0700 In: 3317.5 [P.O.:300; I.V.:155.3; Blood:384.5; IV Piggyback:417; TPN:2060.6] Out: 2094 [BSJGG:8366; Drains:32] Intake/Output from this shift:    Labs:  Recent Labs  12/08/14 0520  WBC 3.6*  HGB 7.2*  HCT 22.0*  PLT 430*    Recent Labs  12/07/14 0512  NA 131*  K 4.5  CL 103  CO2 20*  GLUCOSE 148*  BUN 37*  CREATININE 1.12  CALCIUM 7.8*  MG 1.9  PHOS 3.3  PROT 5.0*  ALBUMIN 2.1*  AST 16  ALT 9*  ALKPHOS 74  BILITOT 0.6   Estimated Creatinine Clearance: 59.4 mL/min (by C-G formula based on Cr of 1.12).    Recent Labs  12/08/14 1818 12/09/14 0017 12/09/14 0615  GLUCAP 158* 154* 159*    Medical History: Past Medical History  Diagnosis Date  . Hypertension   . Anginal pain     hx of CP saw Dr Wynonia Lawman 09/20/2013 H&P per chart   . Left Retroperitoneal mass s/p resection/L nephrectomy 07/13/2014   . Postoperative anemia due to acute blood loss 07/15/2014  . SBO (small bowel obstruction) 08/01/2014, 10/11/14  . Retroperitoneal fluid collection   . 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 Impaired fasting glucose 11/25/2014  . Benign neoplasm of colon 11/25/2014  . Actinic keratosis 11/25/2014  . Mixed hyperlipidemia 11/25/2014    Insulin Requirements: 7 units over the past 24 hours (on sensitive SSI q6h) - no hx DM  Current Nutrition: Clear liquid diet ordered- but  patient has refused   IVF: NS at [facility] access: PICC placed 10/12/14 TPN start date: 8/28  ASSESSMENT                                                                                                          HPI:  64 yoM s/p resection L retroperitoneal sarcoma, L nephrectomy 07/13/14 developed VRE abscess. Hx abd bloating, N/V 2 days PTA, SBO seen on CT for Ca restaging.  Hx recurrent SBO. Begin TPN per Pharmacy  Significant events:   8/29: plan for exp laparotomy on 8/31; s/p aspiration of L retroperitoneal fluid collection on 8/29 8/31:  s/p exp lap -->  I&D L back abscess, lysis of adhesions, small bowel resection, placement og drain into L retroperitoneal abscess 9/2: bloody NGT output, PPI IV BID added;  change clinimix E 5/15 to E 5/20 9/7: NGT clamp study- minimal residual and no n/v 9/8: Clear liquid diet ordered  9/9: NGT removed  Today:   Glucose (goal <150):  all readings < 150 except for one (159)  Electrolytes - Na low at 131;  K, mag, phos WNL (on 9/8)  Renal - WNL   LFTs - AST/ALT low, Alk Phos, Tbili WNL (on 9/8)  TGs - 91 (9/5)  Prealbumin - 6.1 (9/5)  NUTRITIONAL GOALS                                                                                             RD recs:   Kcal: 2300-2500  Protein: 110-120 grams  Fluid: 2.2-2.5 L/day  Clinimix E 5/20 at a goal rate of 93 ml/hr + 20% fat emulsion at 10 ml/hr to provide: 112 g/day protein, 2444 Kcal/day (per dietician recommendations)   PLAN                                                                                                                         At 1800 today:  Continue Clinimix E 5/20 at goal rate of 93 ml/hr.  20% fat emulsion at 10 ml/hr.  TPN to contain standard multivitamins and trace elements.  Continue IVF at [facility].  Continue sensitive SSI q6h.  TPN lab panels on Mondays & Thursdays.  F/u daily.   Dia Sitter, PharmD, BCPS 12/09/2014 8:35 AM

## 2014-12-10 ENCOUNTER — Inpatient Hospital Stay (HOSPITAL_COMMUNITY): Payer: Medicare Other

## 2014-12-10 DIAGNOSIS — C480 Malignant neoplasm of retroperitoneum: Secondary | ICD-10-CM

## 2014-12-10 DIAGNOSIS — Z9889 Other specified postprocedural states: Secondary | ICD-10-CM

## 2014-12-10 DIAGNOSIS — Z905 Acquired absence of kidney: Secondary | ICD-10-CM

## 2014-12-10 DIAGNOSIS — K5669 Other intestinal obstruction: Secondary | ICD-10-CM

## 2014-12-10 LAB — CBC
HEMATOCRIT: 25.3 % — AB (ref 39.0–52.0)
HEMOGLOBIN: 8.3 g/dL — AB (ref 13.0–17.0)
MCH: 27.9 pg (ref 26.0–34.0)
MCHC: 32.8 g/dL (ref 30.0–36.0)
MCV: 84.9 fL (ref 78.0–100.0)
Platelets: 558 10*3/uL — ABNORMAL HIGH (ref 150–400)
RBC: 2.98 MIL/uL — ABNORMAL LOW (ref 4.22–5.81)
RDW: 20 % — AB (ref 11.5–15.5)
WBC: 7.1 10*3/uL (ref 4.0–10.5)

## 2014-12-10 LAB — GLUCOSE, CAPILLARY
GLUCOSE-CAPILLARY: 145 mg/dL — AB (ref 65–99)
GLUCOSE-CAPILLARY: 150 mg/dL — AB (ref 65–99)
GLUCOSE-CAPILLARY: 157 mg/dL — AB (ref 65–99)
GLUCOSE-CAPILLARY: 158 mg/dL — AB (ref 65–99)
Glucose-Capillary: 145 mg/dL — ABNORMAL HIGH (ref 65–99)

## 2014-12-10 LAB — BRAIN NATRIURETIC PEPTIDE: B NATRIURETIC PEPTIDE 5: 103.3 pg/mL — AB (ref 0.0–100.0)

## 2014-12-10 LAB — BASIC METABOLIC PANEL
ANION GAP: 5 (ref 5–15)
BUN: 30 mg/dL — AB (ref 6–20)
CALCIUM: 7.7 mg/dL — AB (ref 8.9–10.3)
CO2: 20 mmol/L — AB (ref 22–32)
Chloride: 105 mmol/L (ref 101–111)
Creatinine, Ser: 0.99 mg/dL (ref 0.61–1.24)
GFR calc Af Amer: >60 mL/min (ref >60)
GLUCOSE: 154 mg/dL — AB (ref 65–99)
POTASSIUM: 5.1 mmol/L (ref 3.5–5.1)
Sodium: 130 mmol/L — ABNORMAL LOW (ref 135–145)

## 2014-12-10 MED ORDER — CLONAZEPAM 0.125 MG PO TBDP
0.5000 mg | ORAL_TABLET | Freq: Two times a day (BID) | ORAL | Status: DC | PRN
  Administered 2014-12-10 – 2014-12-17 (×6): 0.5 mg via ORAL
  Filled 2014-12-10 (×6): qty 4

## 2014-12-10 MED ORDER — FUROSEMIDE 10 MG/ML IJ SOLN
40.0000 mg | Freq: Once | INTRAMUSCULAR | Status: AC
  Administered 2014-12-10: 40 mg via INTRAVENOUS

## 2014-12-10 MED ORDER — FUROSEMIDE 10 MG/ML IJ SOLN
INTRAMUSCULAR | Status: AC
  Administered 2014-12-10: 40 mg via INTRAVENOUS
  Filled 2014-12-10: qty 4

## 2014-12-10 MED ORDER — TRACE MINERALS CR-CU-MN-SE-ZN 10-1000-500-60 MCG/ML IV SOLN
INTRAVENOUS | Status: AC
  Administered 2014-12-10: 18:00:00 via INTRAVENOUS
  Filled 2014-12-10: qty 2232

## 2014-12-10 MED ORDER — SODIUM CHLORIDE 0.9 % IV SOLN
1.0000 g | Freq: Three times a day (TID) | INTRAVENOUS | Status: DC
  Administered 2014-12-10 – 2014-12-15 (×15): 1 g via INTRAVENOUS
  Filled 2014-12-10 (×17): qty 1

## 2014-12-10 MED ORDER — HYDROMORPHONE 0.3 MG/ML IV SOLN
INTRAVENOUS | Status: DC
  Administered 2014-12-10: 0.5 mg via INTRAVENOUS
  Administered 2014-12-10: 1.24 mg via INTRAVENOUS
  Administered 2014-12-11: 0.25 mg via INTRAVENOUS
  Administered 2014-12-11: 0.87 mg via INTRAVENOUS
  Administered 2014-12-11: 0.069 mg via INTRAVENOUS

## 2014-12-10 MED ORDER — FAT EMULSION 20 % IV EMUL
240.0000 mL | INTRAVENOUS | Status: AC
  Administered 2014-12-10: 240 mL via INTRAVENOUS
  Filled 2014-12-10: qty 250

## 2014-12-10 MED ORDER — CLONAZEPAM 0.1 MG/ML ORAL SUSPENSION: 0.5000 mg | Freq: Two times a day (BID) | ORAL | Status: DC | PRN

## 2014-12-10 NOTE — Progress Notes (Signed)
ANTIBIOTIC CONSULT NOTE - INITIAL  Pharmacy Consult for merrem Indication: VRE, Retroperitoneal Abscess  Allergies  Allergen Reactions  . Compazine [Prochlorperazine] Other (See Comments)    Made pt really lethargic, and sleepy    Patient Measurements: Height: 5\' 8"  (172.7 cm) Weight: 163 lb (73.936 kg) IBW/kg (Calculated) : 68.4   Vital Signs: Temp: 98.3 F (36.8 C) (09/11 1339) Temp Source: Oral (09/11 1339) BP: 163/73 mmHg (09/11 1339) Pulse Rate: 102 (09/11 1339) Intake/Output from previous day: 09/10 0701 - 09/11 0700 In: 1305.8 [P.O.:600; I.V.:183.8; IV Piggyback:522] Out: 1080 [XYVOP:9292; Drains:5] Intake/Output from this shift: Total I/O In: -  Out: 350 [Urine:350]  Labs:  Recent Labs  12/08/14 0520 12/10/14 1701  WBC 3.6* 7.1  HGB 7.2* 8.3*  PLT 430* 558*   Estimated Creatinine Clearance: 59.4 mL/min (by C-G formula based on Cr of 1.12). No results for input(s): VANCOTROUGH, VANCOPEAK, VANCORANDOM, GENTTROUGH, GENTPEAK, GENTRANDOM, TOBRATROUGH, TOBRAPEAK, TOBRARND, AMIKACINPEAK, AMIKACINTROU, AMIKACIN in the last 72 hours.   Microbiology: Recent Results (from the past 720 hour(s))  Culture, routine-abscess     Status: None   Collection Time: 11/27/14 12:22 PM  Result Value Ref Range Status   Specimen Description   Final    ABSCESS ASPIRATION OF RECURRENT LEFT RETROPERITONEAL FLUID. Nephrectomy   Special Requests Normal  Final   Gram Stain   Final    ABUNDANT WBC PRESENT,BOTH PMN AND MONONUCLEAR NO SQUAMOUS EPITHELIAL CELLS SEEN NO ORGANISMS SEEN Performed at [facility]    Culture   Final    NO GROWTH 3 DAYS Performed at [facility]    Report Status 11/30/2014 FINAL  Final  Anaerobic culture     Status: None   Collection Time: 11/27/14 12:22 PM  Result Value Ref Range Status   Specimen Description ABSCESS LEFT RETROPERITONEAL FLUID. Nephrectomy   Final   Special Requests Normal  Final   Gram Stain   Final    ABUNDANT  WBC PRESENT,BOTH PMN AND MONONUCLEAR NO SQUAMOUS EPITHELIAL CELLS SEEN NO ORGANISMS SEEN Performed at [facility]    Culture   Final    NO ANAEROBES ISOLATED Performed at [facility]    Report Status 12/02/2014 FINAL  Final  Fungus culture w smear     Status: None (Preliminary result)   Collection Time: 11/27/14 12:22 PM  Result Value Ref Range Status   Specimen Description ABSCESS LEFT RETROPERITONEAL FLUID/Nephrectomy  Final   Special Requests Normal  Final   Fungal Smear   Final    NO YEAST OR FUNGAL ELEMENTS SEEN Performed at [facility]    Culture   Final    CULTURE IN PROGRESS FOR FOUR WEEKS Performed at [facility]    Report Status PENDING  Incomplete  Surgical pcr screen     Status: None   Collection Time: 11/29/14  6:02 AM  Result Value Ref Range Status   MRSA, PCR NEGATIVE NEGATIVE Final   Staphylococcus aureus NEGATIVE NEGATIVE Final    Comment:        The Xpert SA Assay (FDA approved for NASAL specimens in patients over 39 years of age), is one component of a comprehensive surveillance program.  Test performance has been validated by [facility] for patients greater than or equal to 71 year old. It is not intended to diagnose infection nor to guide or monitor treatment.   Culture, routine-abscess     Status: None   Collection Time: 11/29/14 10:23 AM  Result Value Ref  Range Status   Specimen Description ABSCESS BACK  Final   Special Requests NONE  Final   Gram Stain   Final    RARE WBC PRESENT, PREDOMINANTLY PMN NO SQUAMOUS EPITHELIAL CELLS SEEN NO ORGANISMS SEEN Performed at [facility]    Culture   Final    NO GROWTH 3 DAYS Performed at [facility]    Report Status 12/02/2014 FINAL  Final  Anaerobic culture     Status: None   Collection Time: 11/29/14 10:25 AM  Result Value Ref Range Status   Specimen Description ABSCESS BACK  Final   Special Requests NONE  Final   Gram Stain   Final     RARE WBC PRESENT, PREDOMINANTLY PMN NO SQUAMOUS EPITHELIAL CELLS SEEN NO ORGANISMS SEEN Performed at [facility]    Culture   Final    NO ANAEROBES ISOLATED Performed at [facility]    Report Status 12/04/2014 FINAL  Final  Body fluid culture     Status: None   Collection Time: 11/29/14 11:42 AM  Result Value Ref Range Status   Specimen Description FLUID PERITONEAL LEFT PERITONEAL FLUID  Final   Special Requests NONE  Final   Gram Stain   Final    FEW WBC PRESENT, PREDOMINANTLY PMN NO ORGANISMS SEEN    Culture   Final    NO GROWTH 3 DAYS Performed at [facility]    Report Status 12/02/2014 FINAL  Final  Anaerobic culture     Status: None   Collection Time: 11/29/14 11:42 AM  Result Value Ref Range Status   Specimen Description FLUID PERITONEAL  Final   Special Requests NONE  Final   Gram Stain   Final    FEW WBC PRESENT, PREDOMINANTLY PMN NO ORGANISMS SEEN    Culture   Final    NO ANAEROBES ISOLATED Performed at [facility]    Report Status 12/04/2014 FINAL  Final    Medical History: Past Medical History  Diagnosis Date  . Hypertension   . Anginal pain     hx of CP saw Dr Wynonia Lawman 09/20/2013 H&P per chart   . Left Retroperitoneal mass s/p resection/L nephrectomy 07/13/2014   . Postoperative anemia due to acute blood loss 07/15/2014  . SBO (small bowel obstruction) 08/01/2014, 10/11/14  . Retroperitoneal fluid collection   . 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 Impaired fasting glucose 11/25/2014  . Benign neoplasm of colon 11/25/2014  . Actinic keratosis 11/25/2014  . Mixed hyperlipidemia 11/25/2014   Assessment: Patient's a 69 yoM with hx of L retroperitoneal liposarcoma s/p resection and L nephrectomy 07/13/14 who developed VRE abscess (8/12).  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 He underwent repeat aspiration  of fluid on 8/29.  Pharmacy is familiar with patient from current daptomycin consult.  Purulence noted from abdominal wound today.  ID recommending to add merrem to daptomycin to  broaden coverage.  PTA Linezolid, Metronidazole since 11/15/14 - changed from zyvox to dapto d/t suspicion for zyvox induced myelosuppression 8/17 >> Linezolid >> 9/4 8/17 >> Flagyl >> 9/4 >> Daptomycin Rx>>  9/11 >> merrem Rx>>  - CK 20 on 9/04   8/12 L retroperitoneal fluid abscess: VRE (S = Linezolid) 8/29 abd fluid abscess: NGF 8/29 anaerobic abd fluid abscess: NGF 8/29 fungus abd fluid abscess: no yeast or fungal elements seen, cx in progress  x 4 wks 8/31 peritoneal fluid: NGF 8/31 anaerobic peritoneal fluid: neg FINAL 8/31 back abscess: NGF 8/31 anaerobic back abscess: neg FINAL 8/31 MRSA PCR: negative  Plan:  - merrem 1gm IV q8h  Preslie Depasquale P 12/10/2014,5:31 PM

## 2014-12-10 NOTE — Progress Notes (Signed)
Patient ID: Darryl Griffith, male   DOB: 07-03-1945, 69 y.o.   MRN: [id number]  Progress Note: General Surgery Service   Subjective: No acute events  Objective: Vital signs in last 24 hours: Temp:  [98.7 F (37.1 C)-99.5 F (37.5 C)] 98.7 F (37.1 C) (09/11 0651) Pulse Rate:  [105-108] 105 (09/11 0651) Resp:  [20-43] 33 (09/11 0800) BP: (148-161)/(69-77) 151/77 mmHg (09/11 0651) SpO2:  [97 %-100 %] 99 % (09/11 0800) Last BM Date: 12/08/14  Intake/Output from previous day: 09/10 0701 - 09/11 0700 In: 1305.8 [P.O.:600; I.V.:183.8; IV Piggyback:522] Out: 1080 [WUJWJ:1914; Drains:5] Intake/Output this shift:    Lungs: breathing in 20s, coarse b/l  CV: tachycardic  Abd: soft, erythematous around the midline up to 4cm from the incision, some purulent drainage on inspection -staples were opened in the middle of the wound with moderate amount of purulence evacuated. Fascia intact throughout the wound, wound packed.  Extremities: trace edema  Neuro: alert, oriented to self, place and event (thought it was Thursday). Slow to answer simple questions.  Lab Results: CBC   Recent Labs  12/08/14 0520  WBC 3.6*  HGB 7.2*  HCT 22.0*  PLT 430*   BMET No results for input(s): NA, K, CL, CO2, GLUCOSE, BUN, CREATININE, CALCIUM in the last 72 hours. PT/INR No results for input(s): LABPROT, INR in the last 72 hours. ABG No results for input(s): PHART, HCO3 in the last 72 hours.  Invalid input(s): PCO2, PO2  Studies/Results: No results found.  Anti-infectives: Anti-infectives    Start     Dose/Rate Route Frequency Ordered Stop   12/03/14 1300  DAPTOmycin (CUBICIN) 600 mg in sodium chloride 0.9 % IVPB     600 mg 224 mL/hr over 30 Minutes Intravenous Every 24 hours 12/03/14 1157     11/29/14 0830  [MAR Hold]  ceFAZolin (ANCEF) IVPB 2 g/50 mL premix     (MAR Hold since 11/29/14 0825)   2 g 100 mL/hr over 30 Minutes Intravenous On call to O.R. 11/28/14 0850 11/29/14 0908   11/25/14 0000  metroNIDAZOLE (FLAGYL) IVPB 500 mg     500 mg 100 mL/hr over 60 Minutes Intravenous Every 8 hours 11/24/14 2355     11/25/14 0000  linezolid (ZYVOX) IVPB 600 mg  Status:  Discontinued     600 mg 300 mL/hr over 60 Minutes Intravenous Every 12 hours 11/24/14 2355 12/03/14 1145      Medicaions: Scheduled Meds: . sodium chloride   Intravenous Once  . bisacodyl  10 mg Rectal Daily  . DAPTOmycin (CUBICIN)  IV  600 mg Intravenous Q24H  . heparin subcutaneous  5,000 Units Subcutaneous 3 times per day  . HYDROmorphone PCA 0.3 mg/mL   Intravenous 6 times per day  . insulin aspart  0-9 Units Subcutaneous 4 times per day  . lip balm  1 application Topical BID  . methocarbamol (ROBAXIN)  IV  500 mg Intravenous 3 times per day  . metronidazole  500 mg Intravenous Q8H  . pantoprazole (PROTONIX) IV  40 mg Intravenous Q12H   Continuous Infusions: . sodium chloride 10 mL/hr at 12/09/14 1923  . Marland KitchenTPN (CLINIMIX-E) Adult 93 mL/hr at 12/09/14 1725   And  . fat emulsion 240 mL (12/09/14 1725)   PRN Meds:.acetaminophen (TYLENOL) oral liquid 160 mg/5 mL, [DISCONTINUED] acetaminophen **OR** acetaminophen, alum & mag hydroxide-simeth, diphenhydrAMINE **OR** diphenhydrAMINE, hydrALAZINE, HYDROmorphone (DILAUDID) injection, LORazepam, magic mouthwash, menthol-cetylpyridinium, metoprolol, naloxone **AND** sodium chloride, ondansetron (ZOFRAN) IV, ondansetron **OR** [DISCONTINUED] ondansetron (ZOFRAN) IV, phenol, promethazine,  sodium chloride  Assessment/Plan: Patient Active Problem List   Diagnosis Date Noted  . Fluid collection at surgical site   . Small bowel obstruction   . Hypokalemia 11/25/2014  . Spinal stenosis 11/25/2014  . Essential (primary) hypertension 11/25/2014  . Mixed hyperlipidemia 11/25/2014  . Malnutrition of moderate degree 11/08/2014  . Recurrent SBO (small bowel obstruction) 11/07/2014  . Abscess, retroperitoneal   . Melena 09/18/2014  . Hyponatremia 09/18/2014  .  Hyperglycemia 09/18/2014  . Hypoalbuminemia 09/18/2014  . Anemia of chronic disease 09/18/2014  . Hypertension   . 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

## 2014-12-10 NOTE — Progress Notes (Signed)
PARENTERAL NUTRITION CONSULT NOTE - Follow-Up  Pharmacy Consult for TPN Indication: SBO  Allergies  Allergen Reactions  . Compazine [Prochlorperazine] Other (See Comments)    Made pt really lethargic, and sleepy   Patient Measurements: Height: 5' 8"  (172.7 cm) Weight: 163 lb (73.936 kg) IBW/kg (Calculated) : 68.4  Vital Signs: Temp: 98.7 F (37.1 C) (09/11 0651) Temp Source: Oral (09/11 0651) BP: 151/77 mmHg (09/11 0651) Pulse Rate: 105 (09/11 0651) Intake/Output from previous day: 09/10 0701 - 09/11 0700 In: 1305.8 [P.O.:600; I.V.:183.8; IV Piggyback:522] Out: 1080 [MIWOE:3212; Drains:5] Intake/Output from this shift:    Labs:  Recent Labs  12/08/14 0520  WBC 3.6*  HGB 7.2*  HCT 22.0*  PLT 430*   No results for input(s): NA, K, CL, CO2, GLUCOSE, BUN, CREATININE, LABCREA, CREAT24HRUR, CALCIUM, MG, PHOS, PROT, ALBUMIN, AST, ALT, ALKPHOS, BILITOT, BILIDIR, IBILI, PREALBUMIN, TRIG, CHOLHDL, CHOL in the last 72 hours. Estimated Creatinine Clearance: 59.4 mL/min (by C-G formula based on Cr of 1.12).    Recent Labs  12/10/14 0011 12/10/14 0604 12/10/14 0820  GLUCAP 145* 158* 145*    Medical History: Past Medical History  Diagnosis Date  . Hypertension   . Anginal pain     hx of CP saw Dr Wynonia Lawman 09/20/2013 H&P per chart   . Left Retroperitoneal mass s/p resection/L nephrectomy 07/13/2014   . Postoperative anemia due to acute blood loss 07/15/2014  . SBO (small bowel obstruction) 08/01/2014, 10/11/14  . Retroperitoneal fluid collection   . 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 Impaired fasting glucose 11/25/2014  . Benign neoplasm of colon 11/25/2014  . Actinic keratosis 11/25/2014  . Mixed hyperlipidemia 11/25/2014    Insulin Requirements: 9 units over the past 24 hours (on sensitive SSI q6h) - no hx DM  Current Nutrition: Clear liquid diet ordered- but patient has refused   IVF:  NS at [facility] access: PICC placed 10/12/14 TPN start date: 8/28  ASSESSMENT                                                                                                          HPI:  52 yoM s/p resection L retroperitoneal sarcoma, L nephrectomy 07/13/14 developed VRE abscess. Hx abd bloating, N/V 2 days PTA, SBO seen on CT for Ca restaging.  Hx recurrent SBO. Begin TPN per Pharmacy  Significant events:   8/29: plan for exp laparotomy on 8/31; s/p aspiration of L retroperitoneal fluid collection on 8/29 8/31:  s/p exp lap -->  I&D L back abscess, lysis of adhesions, small bowel resection, placement og drain into L retroperitoneal abscess 9/2: bloody NGT output, PPI IV BID added;  change clinimix E 5/15 to E 5/20 9/7: NGT clamp study- minimal residual and no n/v 9/8: Clear liquid diet ordered  9/9: NGT removed  Today:   Glucose (goal <150): cbgs 145-160--> mostly in the 150s  Electrolytes - Na low at 131;  K, mag, phos WNL (on 9/8)  Renal - WNL  LFTs - AST/ALT low, Alk Phos, Tbili WNL (on 9/8)  TGs - 91 (9/5)  Prealbumin - 6.1 (9/5)  NUTRITIONAL GOALS                                                                                             RD recs:   Kcal: 2300-2500  Protein: 110-120 grams  Fluid: 2.2-2.5 L/day  Clinimix E 5/20 at a goal rate of 93 ml/hr + 20% fat emulsion at 10 ml/hr to provide: 112 g/day protein, 2444 Kcal/day (per dietician recommendations)   PLAN                                                                                                                         At 1800 today:  Continue Clinimix E 5/20 at goal rate of 93 ml/hr.  20% fat emulsion at 10 ml/hr.  TPN to contain standard multivitamins and trace elements.  Continue IVF at [facility].  Continue sensitive SSI q6h.  TPN lab panels on Mondays & Thursdays.  F/u daily.   Dia Sitter, PharmD, BCPS 12/10/2014 10:08 AM

## 2014-12-10 NOTE — Progress Notes (Signed)
Dr Rama notified by phone re pt having more labored breathing with audible congestion. Orders received. Lynnsey Barbara, [employer]

## 2014-12-10 NOTE — Progress Notes (Addendum)
Progress Note   Darryl Griffith GMW:[id number] DOB: 03/12/1946 DOA: 11/24/2014 PCP: Gennette Pac, MD   Brief Narrative:   Darryl Griffith is an 69 y.o. 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 General surgery was consulted on admission. Patient did not improve with conservative measures and underwent surgery 11/29/14: incision and drainage of left back abscess, exploratory laparotomy, lysis of adhesions, small bowel resection and placement of drain into left retroperitoneal abscess.   Assessment/Plan:   Principal problem:  SBO status post resection with lysis of adhesions/postoperative ileus -Underwent surgical repair on 8/31 with exploratory laparotomy, lysis of adhesions, small bowel resection, placement of drain into left retroperitoneal abscess.  - Gen. surgery following.  Diet advanced to clears. NG tube out.  - Remains on TNA. Will wean when diet advanced further. 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 Follow-up oncology post discharge.  Left retroperitoneal and left upper quadrant fluid collection  - Cultures initially positive for VRE. Most recent cultures from 11/29/14 were negative. - Status post IR drainage 11/27/14. - ID following. Antibiotics per ID recommendations, will complete course of daptomycin/Flagyl on 12/13/14.  HTN - Continue hydralazine as needed.  Hypokalemia - Resolved.  Hypomagnesemia - Resolved.  Symptomatic Anemia - Monitoring blood counts.  - Status post 2 units of PRBCs 11/27/14, 1 unit 12/01/14, 1 unit 12/05/14 and 1 unit  12/08/14.  Hyponatremia - Monitor sodium, improved.   Neutropenia - Felt to be related to treatment side Vioxx. Changed to cubicin after speaking with Dr Tommy Medal. Discussed with pharmacy.   Tachycardia - Multi-factorial with infection, anemia contributory.  DVT prophylaxis - SCDs.  Code Status: Full Code Family Communication: Daughter Barnett Applebaum updated at bedside.  Disposition Plan: Unclear at present, remains on TNA. Patient's daughter wants him transferred to [place]. Attempted to transfer him however he was not accepted on transfer for given that this would be a lateral transfer of care. Spoke with Dr Evonnie Dawes who recommends consideration for transfer to a [facility] based [facility].  IV Access:    PICC line placed 10/12/14   Procedures and diagnostic studies:   Ct Chest W Contrast  11/23/2014   CLINICAL DATA:  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  CONTRAST:  123mL OMNIPAQUE IOHEXOL 300 MG/ML  SOLN  COMPARISON:  11/07/2014  FINDINGS: CT CHEST FINDINGS  Thoracic inlet: No neck base or axillary masses or adenopathy. Normal thyroid.  Mediastinum and hila: Heart normal in size and configuration. Mild prominence of the ascending aorta measuring 4 cm in diameter. 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 Right PICC tip projects in the lower superior vena cava. No mediastinal or hilar masses or pathologically enlarged lymph nodes.  Lungs and pleura: No lung masses or suspicious nodules. Small left  pleural effusion associated with  mild dependent left lower lobe atelectasis. Small focus of reticular nodular opacity in the posterior right lower lobe is likely additional atelectasis or scarring. There is no lung consolidation or edema. No pneumothorax.  CT ABDOMEN AND PELVIS FINDINGS  There is a fluid collection that extends from the posterior medial left superior retroperitoneum, adjacent to the spleen, to the lateral margin of the superior left psoas muscle. This measures 4.9 x 2.2 cm transversely, previously 5.6 x 2.6 cm. It measures 7.6 cm an approximate superior inferior length, previously 9.2 cm. Fluid attenuation and multiple blastic and clips lie in the left upper quadrant between the posterior wall of the stomach and upper abdominal aorta. There is thickening of this portion of the stomach wall. These changes partly surround the pancreatic tail and surround the left diaphragmatic crus. No left adrenal gland is visible. Left kidney is surgically absent.  Liver and spleen:  Unremarkable.  Gallbladder and biliary tree: Unremarkable.  Pancreas: There is some heterogeneous lower attenuation along the pancreatic tail adjacent to the left upper quadrant surgical changes and low-attenuation soft tissue. This is similar to the prior exam, likely reactive edema. No other pancreatic abnormality.  Right adrenal glands: Normal.  Right kidney, ureter, bladder: 15 mm midpole cyst. No other right renal masses, no stones and no hydronephrosis. Normal ureters. Unremarkable bladder.  Lymph nodes:  No pathologically enlarged lymph nodes.  Ascites:  Trace ascites in the pelvis.  Gastrointestinal: There is a partial small bowel obstruction. The transition point is in the left upper adjacent to the postsurgical changes and residual left retroperitoneal fluid collection. Small bowel proximal to the transition is dilated to a maximum of 5.3 cm. Small bowel distal to this is decompressed. The colon is partly decompressed. There are scattered colonic diverticula  mostly along the sigmoid colon. No diverticulitis. Normal-sized appendix is visualized. No bowel wall thickening or inflammatory changes. No free air.  Abdominal wall: There is edema in the subcutaneous fat mostly along the flanks. There is midline abdominal incision. No hernias.  Vascular: Atherosclerotic changes are noted along the infrarenal abdominal aorta and branch vessels. No aneurysm.  MUSCULOSKELETAL FINDINGS  No osteoblastic or osteolytic lesions. Degenerative changes noted of the visualized spine. Bones are demineralized.  IMPRESSION: 1. Residual left retroperitoneal and left upper quadrant collection associated with postsurgical changes. The defined residual collection, along the posterior medial left retroperitoneum abutting the spleen and left psoas muscle, is smaller, now measuring 4.9 x 2.3 x 7.6 cm, previously 5.6 x 2.6 x 9.2 cm. Left upper quadrant postsurgical and inflammatory changes include thickening of the posterior wall of the stomach, stable. There are numerous vascular clips in the left retroperitoneum and left posterior medial upper quadrant. 2. Fairly high-grade partial small bowel obstruction with the transition point in the posterior medial left upper quadrant adjacent to the residual collection and postsurgical changes. This is similar to prior study. 3. No convincing local residual retroperitoneal sarcoma. 4. No evidence of metastatic disease from the sarcoma.   Electronically Signed   By: Lajean Manes M.D.   On: 11/23/2014 14:36   Ct Abdomen Pelvis W Contrast  11/23/2014   CLINICAL DATA:  Malignant retroperitoneal tumor C48.0 (ICD-10-CM)  Retroperitoneal sarcoma dx;d 1/16 with left nephrectomy & XRT completed 7/16, left pleural effusion, Prominent lt proximal clavicular area x 1 week  EXAM: CT CHEST, ABDOMEN, AND PELVIS WITH CONTRAST  TECHNIQUE: Multidetector CT imaging of the chest, abdomen and pelvis was performed following the standard protocol  during bolus administration of  intravenous contrast.  CONTRAST:  139mL OMNIPAQUE IOHEXOL 300 MG/ML  SOLN  COMPARISON:  11/07/2014  FINDINGS: CT CHEST FINDINGS  Thoracic inlet: No neck base or axillary masses or adenopathy. Normal thyroid.  Mediastinum and hila: Heart normal in size and configuration. Mild prominence of the ascending aorta measuring 4 cm in diameter. 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 Right PICC tip projects in the lower superior vena cava. No mediastinal or hilar masses or pathologically enlarged lymph nodes.  Lungs and pleura: No lung masses or suspicious nodules. Small left pleural effusion associated with mild dependent left lower lobe atelectasis. Small focus of reticular nodular opacity in the posterior right lower lobe is likely additional atelectasis or scarring. There is no lung consolidation or edema. No pneumothorax.  CT ABDOMEN AND PELVIS FINDINGS  There is a fluid collection that extends from the posterior medial left superior retroperitoneum, adjacent to the spleen, to the lateral margin of the superior left psoas muscle. This measures 4.9 x 2.2 cm transversely, previously 5.6 x 2.6 cm. It measures 7.6 cm an approximate superior inferior length, previously 9.2 cm. Fluid attenuation and multiple blastic and clips lie in the left upper quadrant between the posterior wall of the stomach and upper abdominal aorta. There is thickening of this portion of the stomach wall. These changes partly surround the pancreatic tail and surround the left diaphragmatic crus. No left adrenal gland is visible. Left kidney is surgically absent.  Liver and spleen:  Unremarkable.  Gallbladder and biliary tree: Unremarkable.  Pancreas: There is some heterogeneous lower attenuation along the pancreatic tail adjacent to the left upper quadrant surgical changes and low-attenuation soft tissue. This is similar to the prior exam, likely reactive  edema. No other pancreatic abnormality.  Right adrenal glands: Normal.  Right kidney, ureter, bladder: 15 mm midpole cyst. No other right renal masses, no stones and no hydronephrosis. Normal ureters. Unremarkable bladder.  Lymph nodes:  No pathologically enlarged lymph nodes.  Ascites:  Trace ascites in the pelvis.  Gastrointestinal: There is a partial small bowel obstruction. The transition point is in the left upper adjacent to the postsurgical changes and residual left retroperitoneal fluid collection. Small bowel proximal to the transition is dilated to a maximum of 5.3 cm. Small bowel distal to this is decompressed. The colon is partly decompressed. There are scattered colonic diverticula mostly along the sigmoid colon. No diverticulitis. Normal-sized appendix is visualized. No bowel wall thickening or inflammatory changes. No free air.  Abdominal wall: There is edema in the subcutaneous fat mostly along the flanks. There is midline abdominal incision. No hernias.  Vascular: Atherosclerotic changes are noted along the infrarenal abdominal aorta and branch vessels. No aneurysm.  MUSCULOSKELETAL FINDINGS  No osteoblastic or osteolytic lesions. Degenerative changes noted of the visualized spine. Bones are demineralized.  IMPRESSION: 1. Residual left retroperitoneal and left upper quadrant collection associated with postsurgical changes. The defined residual collection, along the posterior medial left retroperitoneum abutting the spleen and left psoas muscle, is smaller, now measuring 4.9 x 2.3 x 7.6 cm, previously 5.6 x 2.6 x 9.2 cm. Left upper quadrant postsurgical and inflammatory changes include thickening of the posterior wall of the stomach, stable. There are numerous vascular clips in the left retroperitoneum and left posterior medial upper quadrant. 2. Fairly high-grade partial small bowel obstruction with the transition point in the posterior medial left upper quadrant adjacent to the  residual collection  and postsurgical changes. This is similar to prior study. 3. No convincing local residual retroperitoneal sarcoma. 4. No evidence of metastatic disease from the sarcoma.   Electronically Signed   By: Lajean Manes M.D.   On: 11/23/2014 14:36   Ct Aspiration  11/28/2014   CLINICAL DATA:  Recurrent left retroperitoneal fluid collection after previous resection of sarcoma. The patient now presents with a small bowel obstruction. Aspiration of the fluid collection has been requested prior to planned surgery to determine if there is any evidence of infection. This fluid collection was last aspirated on 11/10/2014 revealing vancomycin resistant Enterococcus and also previously contained a percutaneous drain for several months.  EXAM: CT GUIDED CATHETER DRAINAGE OF LEFT RETROPERITONEAL FLUID COLLECTION  ANESTHESIA/SEDATION: 1.5  Mg IV Versed; 75 mcg IV Fentanyl  Total Moderate Sedation Time: 15 minutes  PROCEDURE: The procedure risks, benefits, and alternatives were explained to the patient. Questions regarding the procedure were encouraged and answered. The patient understands and consents to the procedure. A time-out was performed prior to the procedure.  The left flank region was prepped with Betadine in a sterile fashion, and a sterile drape was applied covering the operative field. A sterile gown and sterile gloves were used for the procedure. Local anesthesia was provided with 1% Lidocaine.  Under CT guidance, a 5 [place] Yueh centesis catheter was advanced into the left retroperitoneal space. Fluid aspiration was performed through the 5 French catheter. The catheter was then removed. Additional CT was performed.  COMPLICATIONS: None  FINDINGS: Recurrent fluid collection in the medial left retroperitoneal space again identified, identical in appearance to scanning just prior to the previous aspiration procedure on 08/12. Approximately 10 mL of pink colored, turbid fluid was able to be aspirated. Material was sent  for culture analysis. Postprocedural CT shows decrease in size of the fluid collection.  IMPRESSION: CT-guided aspiration of recurrent medial left retroperitoneal fluid collection yielding 10 mL of pink colored, turbid fluid. Fluid was sent for culture analysis.   Electronically Signed   By: Aletta Edouard M.D.   On: 11/28/2014 08:21   Ct Aspiration  11/10/2014   CLINICAL DATA:  History of recurrent left retroperitoneal fluid collection status post surgical excision of a retroperitoneal liposarcoma. Most recently, a drainage catheter was placed on 08/01/2014 and remained in place for 3 months with removal of the drain on 10/31/2014. Imaging on 11/07/2014 for abdominal pain demonstrated some reaccumulation of fluid in the previously drained medial left retroperitoneal space. Aspiration under CT guidance is now performed to determine if the fluid is infected.  EXAM: CT GUIDED ASPIRATION OF LEFT RETROPERITONEAL FLUID COLLECTION  ANESTHESIA/SEDATION: 3.0 Mg IV Versed 75 mcg IV Fentanyl  Total Moderate Sedation Time:  9 minutes.  PROCEDURE: The procedure, risks, benefits, and alternatives were explained to the patient. Questions regarding the procedure were encouraged and answered. The patient understands and consents to the procedure. A time-out was performed prior to the procedure.  The left flank region was prepped with Betadine in a sterile fashion, and a sterile drape was applied covering the operative field. A sterile gown and sterile gloves were used for the procedure. Local anesthesia was provided with 1% Lidocaine.  CT was performed in a prone position. Under CT guidance, a 5 [place] Yueh centesis catheter was advanced over a 19 gauge needle into the left retroperitoneal fluid collection. Aspiration was performed through the [facility] catheter. The aspirated fluid sample was sent for culture analysis including a aerobic culture, anaerobic culture  and fungal culture. Additional saline lavage was performed through  the catheter with 10 mL of sterile saline. CT was performed after catheter removal.  COMPLICATIONS: None  FINDINGS: Aspiration yielded turbid yellow fluid. A total of 20 mL of fluid was able to be aspirated from the collection. After aspiration and additional saline lavage, fluid cavity size was smaller with a small amount of fluid remaining.  IMPRESSION: CT-guided aspiration and drainage of recurrent left retroperitoneal fluid collection. A 20 mL volume of turbid yellow fluid was able to be removed and was sent for culture analysis. Given chronicity of the collection and the presence of a drain in the collection for the last 3 months, aspiration only was performed to determine if the collection is infected prior to committing the patient to potential additional long-term catheter drainage or surgery.   Electronically Signed   By: Aletta Edouard M.D.   On: 11/10/2014 17:16   Ir Venipuncture 71yrs/older By Md  12/01/2014   CLINICAL DATA:  Anemia, difficult access, poor peripheral veins, access for blood transfusion  EXAM: ULTRASOUND GUIDANCE FOR VASCULAR ACCESS  PERIPHERAL IV START BY PHYSICIAN.  Date:  9/2/20169/05/2014 5:13 pm  Radiologist:  M. Daryll Brod, MD  Guidance:  Ultrasound  FLUOROSCOPY TIME:  None.  MEDICATIONS AND MEDICAL HISTORY: None.  ANESTHESIA/SEDATION: None.  CONTRAST:  None.  COMPLICATIONS: None immediate  PROCEDURE: Informed consent was obtained from the patient following explanation of the procedure, risks, benefits and alternatives. The patient understands, agrees and consents for the procedure. All questions were addressed. A time out was performed.  Maximal barrier sterile technique utilized including caps, mask, sterile gowns, sterile gloves, large sterile drape, hand hygiene, and ChloraPrep  Preliminary ultrasound performed. Left basilic vein demonstrated. Under sterile conditions and local anesthesia, a 16 gauge Angiocath was advanced into the left basilic vein under ultrasound. Position  confirmed with ultrasound. Images obtained for documentation. Blood aspirated easily followed by saline flush. This was secured externally with a sterile dressing. Ready for use.  IMPRESSION: Successful ultrasound left upper extremity basilic vein peripheral IV start.   Electronically Signed   By: [name] M.D.   On: 12/01/2014 17:19   Ir US Guide Vasc Access Left  12/01/2014   CLINICAL DATA:  Anemia, difficult access, poor peripheral veins, access for blood transfusion  EXAM: ULTRASOUND GUIDANCE FOR VASCULAR ACCESS  PERIPHERAL IV START BY PHYSICIAN.  Date:  9/2/20169/05/2014 5:13 pm  Radiologist:  M. Daryll Brod, MD  Guidance:  Ultrasound  FLUOROSCOPY TIME:  None.  MEDICATIONS AND MEDICAL HISTORY: None.  ANESTHESIA/SEDATION: None.  CONTRAST:  None.  COMPLICATIONS: None immediate  PROCEDURE: Informed consent was obtained from the patient following explanation of the procedure, risks, benefits and alternatives. The patient understands, agrees and consents for the procedure. All questions were addressed. A time out was performed.  Maximal barrier sterile technique utilized including caps, mask, sterile gowns, sterile gloves, large sterile drape, hand hygiene, and ChloraPrep  Preliminary ultrasound performed. Left basilic vein demonstrated. Under sterile conditions and local anesthesia, a 16 gauge Angiocath was advanced into the left basilic vein under ultrasound. Position confirmed with ultrasound. Images obtained for documentation. Blood aspirated easily followed by saline flush. This was secured externally with a sterile dressing. Ready for use.  IMPRESSION: Successful ultrasound left upper extremity basilic vein peripheral IV start.   Electronically Signed   By: [name] M.D.   On: 12/01/2014 17:19   Dg Abd 2 Views  11/27/2014   CLINICAL DATA:  Followup small  bowel obstruction  EXAM: ABDOMEN - 2 VIEW  COMPARISON:  11/26/2014  FINDINGS: The nasogastric tube is identified with tip in the stomach. The side port  is below the GE junction. Dilated loops of small bowel and air-fluid levels are again noted. These measure up to 6.7 cm. Enteric contrast material is identified within the colon up to the level of the rectum.  IMPRESSION: 1. NG tube is within the stomach. 2. Persistent partial small bowel obstruction 3. Enteric contrast material is identified up the level of the rectum.   Electronically Signed   By: Kerby Moors M.D.   On: 11/27/2014 09:35   Dg Abd 2 Views  11/26/2014   CLINICAL DATA:  Abdominal distention with nausea and vomiting. Surgery in April. Small bowel obstruction symptoms.  EXAM: ABDOMEN - 2 VIEW  COMPARISON:  11/24/2014  FINDINGS: Upright and supine views. Upright view demonstrates persistent small bowel air-fluid levels, increased. Small left pleural effusion. There may be a nasogastric tube looped in the esophagus, incompletely imaged. Surgical clips in the upper abdomen. A metallic object projecting over the a lower chest is presumably external to the patient.  Supine images demonstrate small bowel distension. Example loop measures 6.7 cm today, versus 6.4 cm on the prior. Contrast is identified within the colon. No pneumatosis.  IMPRESSION: Similar small bowel obstruction pattern, without free intraperitoneal air or other acute complication.  The nasogastric may be looped in the distal esophagus. Incompletely imaged. These results will be called to the ordering clinician or representative by the Radiologist Assistant, and communication documented in the PACS or zVision Dashboard.   Electronically Signed   By: Abigail Miyamoto M.D.   On: 11/26/2014 10:07   Dg Abd Acute W/chest  11/24/2014   CLINICAL DATA:  Bloating and nausea since yesterday. Vomiting today. History of bowel obstructions. History of retroperitoneal liposarcoma.  EXAM: DG ABDOMEN ACUTE W/ 1V CHEST  COMPARISON:  CT abdomen and pelvis 11/23/2014  FINDINGS: Right PICC catheter tip over the low SVC region. No pneumothorax. Linear  fibrosis or atelectasis in the left lung base. No focal airspace disease or consolidation in the lungs. Normal heart size and pulmonary vascularity.  Surgical clips in the upper abdomen. Contrast material is demonstrated in the right colon. There are prominent gas-filled distended small bowel loops in the mid abdomen and multiple air-fluid levels are demonstrated on the upright view. Appearance is consistent with mechanical small bowel obstruction. No free intra-abdominal air. No radiopaque stones. Degenerative changes in the spine.  IMPRESSION: No evidence of active pulmonary disease. Fibrosis or atelectasis in the left lung base.  Gaseous distention of mid abdominal small bowel with multiple air-fluid levels consistent with mechanical small bowel obstruction.   Electronically Signed   By: Lucienne Capers M.D.   On: 11/24/2014 21:51   Dg Abd Portable 1v  11/28/2014   CLINICAL DATA:  Evaluate NG tube repositioning.  EXAM: PORTABLE ABDOMEN - 1 VIEW  COMPARISON:  11/27/2014  FINDINGS: The nasogastric tube has been repositioned. The tip is in the fundus of the stomach in the side port is below the GE junction. Surgical clips are noted in the left upper quadrant of the abdomen. Again noted are dilated loops of bowel measuring up to 5 cm.  IMPRESSION: 1. Nasogastric tube is in the stomach. 2. Persistent small bowel dilatation consistent with obstruction.   Electronically Signed   By: Kerby Moors M.D.   On: 11/28/2014 09:05   Dg Abd Portable 1v  11/26/2014  CLINICAL DATA:  Nasogastric tube placement  EXAM: PORTABLE ABDOMEN - 1 VIEW  COMPARISON:  Portable exam 1318 hours compared to 11/26/2014  FINDINGS: Tip of nasogastric tube projects over proximal stomach.  Numerous surgical clips in the perigastric region.  Multiple dilated loops of small bowel again identified compatible with small bowel obstruction.  Small amount of contrast within a decompressed colon.  No definite bowel wall thickening.  Lung bases  clear.  IMPRESSION: Small bowel obstruction.  Tip of nasogastric tube projects over proximal stomach.   Electronically Signed   By: Lavonia Dana M.D.   On: 11/26/2014 16:49     Medical Consultants:    Infectious Disease: Carlyle Basques, MD  General Surgery: Alphonsa Overall, MD  Anti-Infectives:    Daptomycin  Flagyl  Subjective:   Pecola Lawless remains withdrawn and minimally interactive. He tells me he is willing to be transferred to [place]. Continues to report abdominal pain. Had a small BM 2 days ago. Passing flatus. No nausea or vomiting noted. Anxious.  Objective:    Filed Vitals:   12/10/14 0000 12/10/14 0221 12/10/14 0651 12/10/14 0800  BP:  148/69 151/77   Pulse:  108 105   Temp:  99.5 F (37.5 C) 98.7 F (37.1 C)   TempSrc:  Oral Oral   Resp: 26 26 29  33  Height:      Weight:      SpO2: 97% 100% 100% 99%    Intake/Output Summary (Last 24 hours) at 12/10/14 0945 Last data filed at 12/10/14 0100  Gross per 24 hour  Intake 1305.84 ml  Output   1080 ml  Net 225.84 ml   Filed Weights   11/24/14 2020  Weight: 73.936 kg (163 lb)    Exam: Gen:  NAD, depressed affect, disengaged/with flat affect Cardiovascular: Tachycardic, No M/R/G Respiratory:  Lungs CTAB Gastrointestinal:  Abdomen soft, NT/ND, + BS Wound: Packed wet-to-dry with ongoing erythema around upper portions of wound Extremities:  No C/E/C   Data Reviewed:    Labs: Basic Metabolic Panel:  Recent Labs Lab 12/03/14 1235 12/04/14 0600 12/07/14 0512  NA  --  130* 131*  K  --  4.0 4.5  CL  --  100* 103  CO2  --  24 20*  GLUCOSE  --  130* 148*  BUN  --  37* 37*  CREATININE 1.21 1.13 1.12  CALCIUM  --  8.2* 7.8*  MG  --  2.1 1.9  PHOS  --  4.0 3.3   GFR Estimated Creatinine Clearance: 59.4 mL/min (by C-G formula based on Cr of 1.12). Liver Function Tests:  Recent Labs Lab 12/04/14 0600 12/07/14 0512  AST 16 16  ALT 8* 9*  ALKPHOS 53 74  BILITOT 0.6 0.6  PROT 5.1* 5.0*   ALBUMIN 2.4* 2.1*   CBC:  Recent Labs Lab 12/04/14 0600 12/05/14 0510 12/06/14 0500 12/08/14 0520  WBC 1.2* 1.2* 1.9* 3.6*  NEUTROABS 0.3* 0.2*  --  1.4*  HGB 7.2* 6.6* 7.6* 7.2*  HCT 21.6* 20.2* 23.4* 22.0*  MCV 82.4 82.1 82.7 84.0  PLT 316 315 377 430*   Cardiac Enzymes:  Recent Labs Lab 12/03/14 1235  CKTOTAL 20*   CBG:  Recent Labs Lab 12/09/14 1208 12/09/14 1826 12/10/14 0011 12/10/14 0604 12/10/14 0820  GLUCAP 152* 160* 145* 158* 145*   Lipid Profile: No results for input(s): CHOL, HDL, LDLCALC, TRIG, CHOLHDL, LDLDIRECT in the last 72 hours. Microbiology No results found for this or any previous visit (from the  past 240 hour(s)).   Medications:   . sodium chloride   Intravenous Once  . bisacodyl  10 mg Rectal Daily  . DAPTOmycin (CUBICIN)  IV  600 mg Intravenous Q24H  . heparin subcutaneous  5,000 Units Subcutaneous 3 times per day  . HYDROmorphone PCA 0.3 mg/mL   Intravenous 6 times per day  . insulin aspart  0-9 Units Subcutaneous 4 times per day  . lip balm  1 application Topical BID  . methocarbamol (ROBAXIN)  IV  500 mg Intravenous 3 times per day  . metronidazole  500 mg Intravenous Q8H  . pantoprazole (PROTONIX) IV  40 mg Intravenous Q12H   Continuous Infusions: . sodium chloride 10 mL/hr at 12/09/14 1923  . Marland KitchenTPN (CLINIMIX-E) Adult 93 mL/hr at 12/09/14 1725   And  . fat emulsion 240 mL (12/09/14 1725)    Time spent: 25 minutes.    LOS: 16 days   [facility] Pager [phone]. If unable to reach me by pager, please call my cell phone at [phone].  *Please refer to [email or web address], password [email or web address] to get updated schedule on who will round on this patient, as hospitalists switch teams weekly. If 7PM-7AM, please contact night-coverage at [email or web address], password [email or web address] for any overnight needs.  12/10/2014, 9:45 AM

## 2014-12-10 NOTE — Progress Notes (Addendum)
INFECTIOUS DISEASE PROGRESS NOTE  ID: Darryl Griffith is a 69 y.o. 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  Denies SOB. He has a very weak cough.  Noted that surgery opened his wound today and noted purulence.  Per his daughter the erythema surrounding his wound is worse.   Abtx:  Anti-infectives    Start     Dose/Rate Route Frequency Ordered Stop   12/03/14 1300  DAPTOmycin (CUBICIN) 600 mg in sodium chloride 0.9 % IVPB     600 mg 224 mL/hr over 30 Minutes Intravenous Every 24 hours 12/03/14 1157     11/29/14 0830  [MAR Hold]  ceFAZolin (ANCEF) IVPB 2 g/50 mL premix     (MAR Hold since 11/29/14 0825)   2 g 100 mL/hr over 30 Minutes Intravenous On call to O.R. 11/28/14 0850 11/29/14 0908   11/25/14 0000  metroNIDAZOLE (FLAGYL) IVPB 500 mg     500 mg 100 mL/hr over 60 Minutes Intravenous Every 8 hours 11/24/14 2355     11/25/14 0000  linezolid (ZYVOX) IVPB 600 mg  Status:  Discontinued     600 mg 300 mL/hr over 60 Minutes Intravenous Every 12 hours 11/24/14 2355 12/03/14 1145      Medications:  Scheduled: . sodium chloride   Intravenous Once  . bisacodyl  10 mg Rectal Daily  . DAPTOmycin (CUBICIN)  IV  600 mg Intravenous Q24H  . furosemide      . furosemide  40 mg Intravenous Once  . heparin subcutaneous  5,000 Units Subcutaneous 3 times per day  . HYDROmorphone PCA 0.3 mg/mL   Intravenous 6 times per day  . insulin aspart  0-9 Units Subcutaneous 4 times per day  . lip balm  1 application Topical BID  . methocarbamol (ROBAXIN)  IV  500 mg Intravenous 3 times per day  . metronidazole   500 mg Intravenous Q8H  . pantoprazole (PROTONIX) IV  40 mg Intravenous Q12H    Objective: Vital signs in last 24 hours: Temp:  [98.3 F (36.8 C)-99.5 F (37.5 C)] 98.3 F (36.8 C) (09/11 1339) Pulse Rate:  [102-108] 102 (09/11 1339) Resp:  [18-47] 47 (09/11 1339) BP: (146-163)/(69-77) 163/73 mmHg (09/11 1339) SpO2:  [97 %-100 %] 100 % (09/11 1339)   General appearance: alert, cooperative, fatigued and no distress Resp: clear to auscultation bilaterally Cardio: regular rate and rhythm GI: abnormal findings:  hypoactive bowel sounds and wound with halo of erythema. packed with gauze. tenderness inferiorly.   Lab Results  Recent Labs  12/08/14 0520  WBC 3.6*  HGB 7.2*  HCT 22.0*   Liver Panel No results for input(s): PROT, ALBUMIN, AST, ALT, ALKPHOS, BILITOT, BILIDIR, IBILI in the last 72 hours. Sedimentation Rate No results for input(s): ESRSEDRATE in the last 72 hours. C-Reactive Protein No results for input(s): CRP in the last 72 hours.  Microbiology: No results found for this or any previous visit (from the past 240 hour(s)).  Studies/Results: Dg Chest Port 1 View  12/10/2014   CLINICAL DATA:  Shortness of breath, tachycardia.  EXAM: PORTABLE CHEST - 1 VIEW  COMPARISON:  July 13, 2014.  FINDINGS: Stable cardiomediastinal silhouette. No pneumothorax is noted. Increased bilateral upper lobe opacities are noted concerning for pneumonia or possibly edema. Increased mild left pleural effusion is noted. Right-sided PICC line is noted with distal tip overlying expected position of the SVC. Bony thorax is intact.  IMPRESSION: Increased bilateral upper lobe opacities are noted concerning for edema or possibly pneumonia. Increased mild left pleural effusion is noted.   Electronically Signed   By: Marijo Conception, M.D.   On: 12/10/2014 16:53     Assessment/Plan: Liposarcoma resection and L nephrectomy 06-2014 (margins +)  XRT  Abscess (VRE) 09-2014, IR drain  Repeat drain  11-29-14  Cx negative. Noted lack of purulence in OR.   SBO 10-31-14  resected 11-29-14  Total days of antibiotics 60  Day 24 metronidazole  Day 8 daptomycin  Postoperative day 11  Afebrile since 9-8 WBC has improved, ANC 1400 Plan was to stop anbx on 9-14 however his wound issues are concerning.  Will broaden his coverage to merrem and dapto.  Spoke at length to his daughter about his issues. She has concerns re: CRE. I told her that in our health system, this is extremely uncommon.  She also wanted to know if he will get a VAC, i will defer this conversation to surgery.   Therapeutic Drug monitoring- Cr is stable 9-8, CK was 20 (9-4). Will repeat CK in AM.    Dr Megan Salon returns 9-12         [name] Infectious [facility] (pager) [phone] [email or web address] 12/10/2014, 5:05 PM  LOS: 16 days

## 2014-12-11 DIAGNOSIS — L8990 Pressure ulcer of unspecified site, unspecified stage: Secondary | ICD-10-CM

## 2014-12-11 DIAGNOSIS — J810 Acute pulmonary edema: Secondary | ICD-10-CM | POA: Diagnosis not present

## 2014-12-11 LAB — DIFFERENTIAL
BASOS ABS: 0.1 10*3/uL (ref 0.0–0.1)
BASOS PCT: 1 % (ref 0–1)
EOS ABS: 0.2 10*3/uL (ref 0.0–0.7)
Eosinophils Relative: 2 % (ref 0–5)
LYMPHS ABS: 0.8 10*3/uL (ref 0.7–4.0)
LYMPHS PCT: 10 % — AB (ref 12–46)
MONO ABS: 0.9 10*3/uL (ref 0.1–1.0)
Monocytes Relative: 11 % (ref 3–12)
NEUTROS ABS: 6.3 10*3/uL (ref 1.7–7.7)
NEUTROS PCT: 76 % (ref 43–77)

## 2014-12-11 LAB — CBC
HCT: 27.4 % — ABNORMAL LOW (ref 39.0–52.0)
HEMOGLOBIN: 9 g/dL — AB (ref 13.0–17.0)
MCH: 27.7 pg (ref 26.0–34.0)
MCHC: 32.8 g/dL (ref 30.0–36.0)
MCV: 84.3 fL (ref 78.0–100.0)
PLATELETS: 638 10*3/uL — AB (ref 150–400)
RBC: 3.25 MIL/uL — ABNORMAL LOW (ref 4.22–5.81)
RDW: 20 % — ABNORMAL HIGH (ref 11.5–15.5)
WBC: 8.3 10*3/uL (ref 4.0–10.5)

## 2014-12-11 LAB — COMPREHENSIVE METABOLIC PANEL
ALT: 9 U/L — AB (ref 17–63)
AST: 17 U/L (ref 15–41)
Albumin: 2.5 g/dL — ABNORMAL LOW (ref 3.5–5.0)
Alkaline Phosphatase: 111 U/L (ref 38–126)
Anion gap: 10 (ref 5–15)
BILIRUBIN TOTAL: 0.4 mg/dL (ref 0.3–1.2)
BUN: 32 mg/dL — ABNORMAL HIGH (ref 6–20)
CHLORIDE: 101 mmol/L (ref 101–111)
CO2: 19 mmol/L — ABNORMAL LOW (ref 22–32)
CREATININE: 1.09 mg/dL (ref 0.61–1.24)
Calcium: 8.2 mg/dL — ABNORMAL LOW (ref 8.9–10.3)
Glucose, Bld: 148 mg/dL — ABNORMAL HIGH (ref 65–99)
POTASSIUM: 4.8 mmol/L (ref 3.5–5.1)
Sodium: 130 mmol/L — ABNORMAL LOW (ref 135–145)
TOTAL PROTEIN: 5.9 g/dL — AB (ref 6.5–8.1)

## 2014-12-11 LAB — GLUCOSE, CAPILLARY
GLUCOSE-CAPILLARY: 137 mg/dL — AB (ref 65–99)
GLUCOSE-CAPILLARY: 152 mg/dL — AB (ref 65–99)
GLUCOSE-CAPILLARY: 160 mg/dL — AB (ref 65–99)
GLUCOSE-CAPILLARY: 175 mg/dL — AB (ref 65–99)
Glucose-Capillary: 137 mg/dL — ABNORMAL HIGH (ref 65–99)

## 2014-12-11 LAB — TRIGLYCERIDES: TRIGLYCERIDES: 123 mg/dL (ref <150)

## 2014-12-11 LAB — PHOSPHORUS: PHOSPHORUS: 3.9 mg/dL (ref 2.5–4.6)

## 2014-12-11 LAB — CK: Total CK: 13 U/L — ABNORMAL LOW (ref 49–397)

## 2014-12-11 LAB — MAGNESIUM: MAGNESIUM: 1.9 mg/dL (ref 1.7–2.4)

## 2014-12-11 LAB — PREALBUMIN: PREALBUMIN: 6 mg/dL — AB (ref 18–38)

## 2014-12-11 MED ORDER — TRAMADOL HCL 50 MG PO TABS
50.0000 mg | ORAL_TABLET | Freq: Four times a day (QID) | ORAL | Status: DC | PRN
  Administered 2014-12-14 – 2014-12-15 (×2): 100 mg via ORAL
  Administered 2014-12-16: 50 mg via ORAL
  Filled 2014-12-11: qty 1
  Filled 2014-12-11 (×2): qty 2

## 2014-12-11 MED ORDER — MAGNESIUM SULFATE IN D5W 10-5 MG/ML-% IV SOLN
1.0000 g | Freq: Once | INTRAVENOUS | Status: AC
  Administered 2014-12-11: 1 g via INTRAVENOUS
  Filled 2014-12-11: qty 100

## 2014-12-11 MED ORDER — ACETAMINOPHEN 325 MG PO TABS
650.0000 mg | ORAL_TABLET | Freq: Four times a day (QID) | ORAL | Status: DC
  Administered 2014-12-11 – 2014-12-17 (×22): 650 mg via ORAL
  Filled 2014-12-11 (×41): qty 2

## 2014-12-11 MED ORDER — TRACE MINERALS CR-CU-MN-SE-ZN 10-1000-500-60 MCG/ML IV SOLN
INTRAVENOUS | Status: AC
  Administered 2014-12-11: 19:00:00 via INTRAVENOUS
  Filled 2014-12-11: qty 2232

## 2014-12-11 MED ORDER — OXYCODONE HCL 5 MG PO TABS
5.0000 mg | ORAL_TABLET | ORAL | Status: DC | PRN
  Administered 2014-12-11 (×2): 5 mg via ORAL
  Administered 2014-12-12 – 2014-12-13 (×6): 10 mg via ORAL
  Administered 2014-12-14 (×2): 5 mg via ORAL
  Administered 2014-12-14 – 2014-12-16 (×4): 10 mg via ORAL
  Administered 2014-12-16: 5 mg via ORAL
  Administered 2014-12-16 – 2014-12-17 (×2): 10 mg via ORAL
  Filled 2014-12-11: qty 2
  Filled 2014-12-11: qty 1
  Filled 2014-12-11: qty 2
  Filled 2014-12-11: qty 1
  Filled 2014-12-11 (×3): qty 2
  Filled 2014-12-11 (×2): qty 1
  Filled 2014-12-11 (×4): qty 2
  Filled 2014-12-11: qty 1
  Filled 2014-12-11 (×3): qty 2

## 2014-12-11 MED ORDER — BACLOFEN 10 MG PO TABS
10.0000 mg | ORAL_TABLET | Freq: Three times a day (TID) | ORAL | Status: DC | PRN
  Filled 2014-12-11: qty 1

## 2014-12-11 MED ORDER — MEGESTROL ACETATE 400 MG/10ML PO SUSP
400.0000 mg | Freq: Every day | ORAL | Status: DC
Start: 2014-12-11 — End: 2014-12-17
  Administered 2014-12-11 – 2014-12-17 (×7): 400 mg via ORAL
  Filled 2014-12-11 (×7): qty 10

## 2014-12-11 MED ORDER — FAT EMULSION 20 % IV EMUL
240.0000 mL | INTRAVENOUS | Status: AC
  Administered 2014-12-11: 240 mL via INTRAVENOUS
  Filled 2014-12-11: qty 250

## 2014-12-11 NOTE — Progress Notes (Signed)
Followed up with daughter Barnett Applebaum regarding concerns of isolation precautions and wound care consult.  Notified daughter that wound care RN had been consulted and pt to remain on contact precaution despite recent - cultures d/t policy of keeping pts on contact for 1 year of + VRE result per IP.  Daughter verbalized understanding.

## 2014-12-11 NOTE — Progress Notes (Signed)
F/u with IP regarding contact precautions and isolation status.  Per IP, pt with + VRE results are required to stay on contact precautions for 1 year despite recent - cultures. Contact Precautions continued.

## 2014-12-11 NOTE — Consult Note (Addendum)
WOC wound consult note Reason for Consult: Pressure injury in a patient with liposarcoma and left nephrectomy who is s/p radiotherapy for sarcoma of the retroperitoneum. He has recent weight loss, malnutrition and this is his 3rd admission for a SBO. On antibiotic therapy for retroperitoneal abscess. RD, IP in as consults. Wound type:Deep tissue pressure injury (DTPI) Pressure Ulcer POA: No Measurement: Area of involvement is erythremic and measures 10cm x 8cm with a 2.5cm x 1.5cm dark red area on the sacrum that is sloughing in a pattern consistent with DTPI to reveal a full thickness necrotic wound bed.  The intragluteal cleft is also a deep hue of red/maroon and measures 5cm x 3cm with no revelation of the wound depth, but the epidermis is sloughing. Wound bed: As described above. Drainage (amount, consistency, odor) Scant serous Periwound: Intact. Dressing procedure/placement/frequency: I will provide a soft silicone foam dressing as topical care to the affected area and give Nursing guidance for its use and change frequency.  Additionally, as patient is reluctant to change position as frequently as recommended, I will augment the prevention and treatment POC with a therapeutic mattress with low air loss feature.  Patient and wife are amenable to this.  Finally, as patient is now getting up in the chair for short periods, I will provide a pressure redistribution chair cushion for his use when OOB. Bilateral heels are intact and patient is moving his feet and legs more often than his trunk, so at this time I will not recommend or provide Prevalon pressure redistribution boots for the heels. [place] nursing team will not follow routinely, but will remain available to this patient, the nursing, surgical and medical teams.  Please re-consult if needed. Thanks, Maudie Flakes, MSN, RN, [place], [place], [place] [phone])

## 2014-12-11 NOTE — Progress Notes (Signed)
Patient ID: Darryl Griffith, male   DOB: [birth year], 69 y.o.   MRN: [id number]   Progress Note: General Surgery Service   Subjective: Wound opened by Dr. Kieth Brightly.    Objective: Vital signs in last 24 hours: Temp:  [98.3 F (36.8 C)-99.3 F (37.4 C)] 99.3 F (37.4 C) (09/12 0648) Pulse Rate:  [102-115] 112 (09/12 0648) Resp:  [18-47] 30 (09/12 0800) BP: (134-163)/(70-83) 134/83 mmHg (09/12 0648) SpO2:  [95 %-100 %] 100 % (09/12 0648) Last BM Date: 12/08/14  Intake/Output from previous day: 09/11 0701 - 09/12 0700 In: 4573.2 [P.O.:330; I.V.:216.2; IV Piggyback:577] Out: 2870 [Urine:2850; Drains:20] Intake/Output this shift:    Lungs: breathing in 20s, coarse b/l  CV: tachycardic  Abd: soft, non distended, approp tender.  Wound with mild drainage.    Extremities: trace edema  Neuro: alert, oriented to self, place and event  Slow to answer simple questions.  Lab Results: CBC   Recent Labs  12/10/14 1701 12/11/14 0610  WBC 7.1 8.3  HGB 8.3* 9.0*  HCT 25.3* 27.4*  PLT 558* 638*   BMET  Recent Labs  12/10/14 1701 12/11/14 0610  NA 130* 130*  K 5.1 4.8  CL 105 101  CO2 20* 19*  GLUCOSE 154* 148*  BUN 30* 32*  CREATININE 0.99 1.09  CALCIUM 7.7* 8.2*   PT/INR No results for input(s): LABPROT, INR in the last 72 hours. ABG No results for input(s): PHART, HCO3 in the last 72 hours.  Invalid input(s): PCO2, PO2  Studies/Results: Dg Chest Port 1 View  12/10/2014   CLINICAL DATA:  Shortness of breath, tachycardia.  EXAM: PORTABLE CHEST - 1 VIEW  COMPARISON:  July 13, 2014.  FINDINGS: Stable cardiomediastinal silhouette. No pneumothorax is noted. Increased bilateral upper lobe opacities are noted concerning for pneumonia or possibly edema. Increased mild left pleural effusion is noted. Right-sided PICC line is noted with distal tip overlying expected position of the SVC. Bony thorax is intact.  IMPRESSION: Increased bilateral upper lobe opacities are noted  concerning for edema or possibly pneumonia. Increased mild left pleural effusion is noted.   Electronically Signed   By: Marijo Conception, M.D.   On: 12/10/2014 16:53    Anti-infectives: Anti-infectives    Start     Dose/Rate Route Frequency Ordered Stop   12/10/14 1800  meropenem (MERREM) 1 g in sodium chloride 0.9 % 100 mL IVPB     1 g 200 mL/hr over 30 Minutes Intravenous Every 8 hours 12/10/14 1741     12/03/14 1300  DAPTOmycin (CUBICIN) 600 mg in sodium chloride 0.9 % IVPB     600 mg 224 mL/hr over 30 Minutes Intravenous Every 24 hours 12/03/14 1157     11/29/14 0830  [MAR Hold]  ceFAZolin (ANCEF) IVPB 2 g/50 mL premix     (MAR Hold since 11/29/14 0825)   2 g 100 mL/hr over 30 Minutes Intravenous On call to O.R. 11/28/14 0850 11/29/14 0908   11/25/14 0000  metroNIDAZOLE (FLAGYL) IVPB 500 mg  Status:  Discontinued     500 mg 100 mL/hr over 60 Minutes Intravenous Every 8 hours 11/24/14 2355 12/10/14 1729   11/25/14 0000  linezolid (ZYVOX) IVPB 600 mg  Status:  Discontinued     600 mg 300 mL/hr over 60 Minutes Intravenous Every 12 hours 11/24/14 2355 12/03/14 1145      Medicaions: Scheduled Meds: . sodium chloride   Intravenous Once  . bisacodyl  10 mg Rectal Daily  . DAPTOmycin (CUBICIN)  IV  600 mg Intravenous Q24H  . heparin subcutaneous  5,000 Units Subcutaneous 3 times per day  . HYDROmorphone PCA 0.3 mg/mL   Intravenous 6 times per day  . insulin aspart  0-9 Units Subcutaneous 4 times per day  . lip balm  1 application Topical BID  . magnesium sulfate 1 - 4 g bolus IVPB  1 g Intravenous Once  . meropenem (MERREM) IV  1 g Intravenous Q8H  . methocarbamol (ROBAXIN)  IV  500 mg Intravenous 3 times per day  . pantoprazole (PROTONIX) IV  40 mg Intravenous Q12H   Continuous Infusions: . sodium chloride Stopped (12/10/14 1900)  . Marland KitchenTPN (CLINIMIX-E) Adult 93 mL/hr at 12/10/14 1730   And  . fat emulsion 240 mL (12/10/14 1730)   PRN Meds:.acetaminophen (TYLENOL) oral liquid  160 mg/5 mL, [DISCONTINUED] acetaminophen **OR** acetaminophen, alum & mag hydroxide-simeth, clonazepam, diphenhydrAMINE **OR** diphenhydrAMINE, hydrALAZINE, HYDROmorphone (DILAUDID) injection, LORazepam, magic mouthwash, menthol-cetylpyridinium, metoprolol, naloxone **AND** sodium chloride, ondansetron (ZOFRAN) IV, ondansetron **OR** [DISCONTINUED] ondansetron (ZOFRAN) IV, phenol, promethazine, sodium chloride  Assessment/Plan: Patient Active Problem List   Diagnosis Date Noted  . Pressure ulcer 12/11/2014  . Fluid collection at surgical site   . Small bowel obstruction   . Hypokalemia 11/25/2014  . Spinal stenosis 11/25/2014  . Essential (primary) hypertension 11/25/2014  . Mixed hyperlipidemia 11/25/2014  . Malnutrition of moderate degree 11/08/2014  . Recurrent SBO (small bowel obstruction) 11/07/2014  . Abscess, retroperitoneal   . Melena 09/18/2014  . Hyponatremia 09/18/2014  . Hyperglycemia 09/18/2014  . Hypoalbuminemia 09/18/2014  . Anemia of chronic disease 09/18/2014  . Hypertension   . 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  Will likely place wound vac in next few days.   -monitor closely for signs of worsening SIRS   LOS: 26 days   Mieczyslaw Stamas, [place] [facility]

## 2014-12-11 NOTE — Care Management Important Message (Signed)
Important Message  Patient Details IM Letter given to [facility] Case Manger to present to [place] Message  Patient Details  Name: Ferdinand Revoir MRN: [id number] Date of Birth: 1945/12/15   Medicare Important Message Given:  Yes-second notification given    Camillo Flaming 12/11/2014, 12:52 PM Name: Harvey Matlack MRN: [id number] Date of Birth: [birth year]   Medicare Important Message Given:  Yes-second notification given    Camillo Flaming 12/11/2014, 12:51 PM

## 2014-12-11 NOTE — Progress Notes (Signed)
PARENTERAL NUTRITION CONSULT NOTE - Follow-Up  Pharmacy Consult for TPN Indication: SBO  Allergies  Allergen Reactions  . Compazine [Prochlorperazine] Other (See Comments)    Made pt really lethargic, and sleepy   Patient Measurements: Height: 5' 8"  (172.7 cm) Weight: 163 lb (73.936 kg) IBW/kg (Calculated) : 68.4  Vital Signs: Temp: 99.3 F (37.4 C) (09/12 0648) Temp Source: Oral (09/12 0648) BP: 134/83 mmHg (09/12 0648) Pulse Rate: 112 (09/12 0648) Intake/Output from previous day: 09/11 0701 - 09/12 0700 In: 4573.2 [P.O.:330; I.V.:216.2; IV BHALPFXTK:240] Out: 2870 [Urine:2850; Drains:20] Intake/Output from this shift:    Labs:  Recent Labs  12/10/14 1701 12/11/14 0610  WBC 7.1 8.3  HGB 8.3* 9.0*  HCT 25.3* 27.4*  PLT 558* 638*    Recent Labs  12/10/14 1701 12/11/14 0610  NA 130* 130*  K 5.1 4.8  CL 105 101  CO2 20* 19*  GLUCOSE 154* 148*  BUN 30* 32*  CREATININE 0.99 1.09  CALCIUM 7.7* 8.2*  MG  --  1.9  PHOS  --  3.9  PROT  --  5.9*  ALBUMIN  --  2.5*  AST  --  17  ALT  --  9*  ALKPHOS  --  111  BILITOT  --  0.4   Estimated Creatinine Clearance: 61 mL/min (by C-G formula based on Cr of 1.09).    Recent Labs  12/10/14 1836 12/11/14 0018 12/11/14 0658  GLUCAP 157* 160* 137*    Medical History: Past Medical History  Diagnosis Date  . Hypertension   . Anginal pain     hx of CP saw Dr Wynonia Lawman 09/20/2013 H&P per chart   . Left Retroperitoneal mass s/p resection/L nephrectomy 07/13/2014   . Postoperative anemia due to acute blood loss 07/15/2014  . SBO (small bowel obstruction) 08/01/2014, 10/11/14  . Retroperitoneal fluid collection   . 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 Impaired fasting glucose 11/25/2014  . Benign neoplasm of colon 11/25/2014  . Actinic keratosis 11/25/2014  . Mixed hyperlipidemia 11/25/2014    Insulin Requirements: 7 units over the past 24  hours (on sensitive SSI q6h) - no hx DM  Current Nutrition: Clear liquid diet (limited intake)  [facility]: [facility] at [facility] access: PICC placed 10/12/14 TPN start date: 8/28  ASSESSMENT                                                                                                          HPI:  4 yoM s/p resection L retroperitoneal sarcoma, L nephrectomy 07/13/14 developed VRE abscess. Hx abd bloating, N/V 2 days PTA, SBO seen on CT for Ca restaging.  Hx recurrent SBO. Begin TPN per Pharmacy  Significant events:   8/29: plan for exp laparotomy on 8/31; s/p aspiration of L retroperitoneal fluid collection on 8/29 8/31:  s/p exp lap -->  I&D L back abscess, lysis of adhesions, small bowel resection, placement og drain into L retroperitoneal abscess 9/2: bloody NGT output, PPI IV BID  added;  change clinimix E 5/15 to E 5/20 9/7: NGT clamp study- minimal residual and no n/v 9/8: Clear liquid diet ordered  9/9: NGT removed  Today:   Glucose (goal <150): cbgs 137-160--> mostly in the 150s  Electrolytes - Na low at 130, Mag 1.9;  K, phos, CoCa WNL   Renal - WNL   LFTs - AST/ALT low, Alk Phos, Tbili WNL   TGs - 91 (9/5)  Prealbumin - 6.1 (9/5)  NUTRITIONAL GOALS                                                                                             RD recs:   Kcal: 2300-2500  Protein: 110-120 grams  Fluid: 2.2-2.5 L/day  Clinimix E 5/20 at a goal rate of 93 ml/hr + 20% fat emulsion at 10 ml/hr to provide: 112 g/day protein, 2444 Kcal/day (per dietician recommendations)   PLAN                                                                                                                         1) magnesium sulfate 1 gm IV x1 2) At 1800 today:  Continue Clinimix E 5/20 at goal rate of 93 ml/hr.  20% fat emulsion at 10 ml/hr.  TPN to contain standard multivitamins and trace elements.  Continue IVF at [facility].  Continue sensitive SSI q6h.  TPN lab panels on Mondays &  Thursdays.  F/u daily.   Dia Sitter, PharmD, BCPS 12/11/2014 7:06 AM

## 2014-12-11 NOTE — Progress Notes (Signed)
Patient ID: Darryl Griffith, male   DOB: Aug 14, 1945, 69 y.o.   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 sodium chloride   Intravenous Once  . acetaminophen  650 mg Oral Q6H  . bisacodyl  10 mg Rectal Daily  . DAPTOmycin (CUBICIN)  IV  600 mg Intravenous Q24H  . heparin subcutaneous  5,000 Units Subcutaneous 3 times per day  . insulin aspart  0-9 Units Subcutaneous 4 times per day  . lip balm  1 application Topical BID  . megestrol  400 mg Oral Daily  . meropenem (MERREM) IV  1 g Intravenous Q8H  . pantoprazole (PROTONIX) IV  40 mg Intravenous Q12H    Past Medical History  Diagnosis Date  . Hypertension   . Anginal pain     hx of CP saw Dr Wynonia Lawman 09/20/2013 H&P per chart   . Left Retroperitoneal mass s/p resection/L nephrectomy 07/13/2014   . Postoperative anemia due to acute blood loss 07/15/2014  . SBO (small bowel obstruction) 08/01/2014, 10/11/14  . Retroperitoneal fluid collection   . 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 Impaired fasting glucose 11/25/2014  . Benign neoplasm of colon 11/25/2014  . Actinic keratosis 11/25/2014  . Mixed hyperlipidemia 11/25/2014    Social History  Substance Use Topics  . Smoking status: Never Smoker   . Smokeless tobacco: Never Used  . Alcohol Use: No    Family History  Problem Relation Age of  Onset  . Adopted: Yes  . Family history unknown: Yes   Allergies  Allergen Reactions  . Compazine [Prochlorperazine] Other (See Comments)    Made pt really lethargic, and sleepy    OBJECTIVE: Filed Vitals:   12/11/14 0400 12/11/14 0648 12/11/14 0800 12/11/14 1232  BP:  134/83    Pulse:  112    Temp:  99.3 F (37.4 C)    TempSrc:  Oral    Resp: 26 24 30  41  Height:      Weight:      SpO2: 100% 100%  95%   Body mass index is 24.79 kg/(m^2).  Abdomen: Wound is now packed with gauze. Drain output 20 mL yesterday.  Lab Results Lab Results  Component Value Date   WBC 8.3 12/11/2014   HGB 9.0* 12/11/2014   HCT 27.4* 12/11/2014   MCV 84.3 12/11/2014   PLT 638* 12/11/2014    Lab Results  Component Value Date   CREATININE 1.09 12/11/2014   BUN 32* 12/11/2014   NA 130* 12/11/2014   K 4.8 12/11/2014   CL 101 12/11/2014   CO2 19* 12/11/2014    Lab Results  Component Value Date   ALT 9* 12/11/2014  AST 17 12/11/2014   ALKPHOS 111 12/11/2014   BILITOT 0.4 12/11/2014     Microbiology: No results found for this or any previous visit (from the past 240 hour(s)).   ASSESSMENT: His wound was opened yesterday after developing erythema around the wound. Purulent drainage was noted but no new specimens were sent for stain or culture. His antibiotics therapy was broadened to daptomycin and meropenem.  PLAN: 1. Continue daptomycin and meropenem 2. I will follow-up later this week  [place] pager   [phone] cell 12/11/2014, 3:58 PM

## 2014-12-11 NOTE — Progress Notes (Signed)
Nutrition Follow-up  DOCUMENTATION CODES:   Non-severe (moderate) malnutrition in context of chronic illness  INTERVENTION:  - Continue CLD and advance as medically feasible - Continue TPN per pharmacy/wean with further diet advancement - RD will continue to monitor for needs  NUTRITION DIAGNOSIS:   Inadequate oral intake related to inability to eat as evidenced by NPO status. -now on CLD with minimal intakes  GOAL:   Patient will meet greater than or equal to 90% of their needs -met with TPN  MONITOR:   Diet advancement, Weight trends, Labs, I & O's  ASSESSMENT:   69 y.o. 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 He denies any hematemesis, fevers or chills. A Ct scan was performed [age] y.o. 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 He denies any hematemesis, fevers or chills. A Ct scan was performed 1 day ago for cancer staging and revealed a high grade partial small bowl obstruction and General surgery was consulted and an NGT was placed. He was referred for medical admission. He is on oral Linezolid and Metronidazole for +VRE For a chronic intra-abdominal abscess. y ago for cancer staging and revealed a high grade partial small bowl obstruction and General surgery was consulted and an NGT was placed. He was referred for medical admission. He is on oral Linezolid and Metronidazole for +VRE For a chronic intra-abdominal abscess.   9/12 POD # 12. NGT now removed and diet advanced to CLD 9/8 @ 1105. Per chart review,  pt consumed 35% breakfast and 20% lunch yesterday. Wound care RN in with pt at time of visit. Pt denies having anything to eat/drink this AM and states pain to abdominal area.  Per MD notes, place to continue current TPN order until diet is further advanced. Pt currently receiving Clinimix E 5/20 @ 93 with 20% lipids @ 10 mL/hr. This regimen is providing 2444 kcal, 112 grams protein which is meeting needs.  Medications reviewed. Labs reviewed; CBGs: 137-160 mg/dL, Na: 130 mmol/L, BUN elevated, Ca: 8.2 mg/dL.   9/7 - POD #7; pt continues with NGT with plan, per surgery, to trial clamping today and allow ice chips.  - Pt has been having flatus and had BM today. - TPN was adjusted at 1800 on 9/2 and that regimen continues: Clinimix E 5/20 @ 93 with 20% lipids @ 10 mL/hr. This regimen is providing 2444 kcal, 112 grams protein which is meeting needs.  Per pharmacy note this AM: At 1800 today:  Continue Clinimix E 5/20 at goal rate of 93 ml/hr.  20% fat emulsion at 10 ml/hr.  TPN to contain standard multivitamins and trace elements.  Continue IVF at [facility].  Continue sensitive SSI q6h.  TPN lab panels on Mondays & Thursdays.  9/2 - POD #2. Pt continues to be NPO. NGT remains in place and surgery notes plan to add Protonix BID due to bloody drainage.  - Pt continues with Clinimix E 5/15 @ 100 mL/hr with 20% lipids @ 10 mL/hr which is providing 120 grams protein, 2184 kcal (100% protein needs  and 95% minimum estimated kcal needs). - No pharmacy note yet today, but per note yesterday AM: 1) magnesium sulfate 1gm IV x1 2) At 1800 today:  continue Clinimix E 5/15 rate to 100 ml/hr (goal rate). Once CBGs improve, will transition over to Clinimix E 5/20  20% fat emulsion at 10 ml/hr.  TPN to contain standard multivitamins and trace elements.  Reduce IVF to [facility].  Continue sensitive SSI q6h  TPN lab panels on Mondays & Thursdays.    *See previous RD follow-up notes for information  earlier in admission.  Diet Order:  Diet clear liquid Room service appropriate?: Yes; Fluid consistency:: Thin TPN (CLINIMIX-E) Adult  Skin:  Wound (see comment) (L back and abdominal surgical incisions)  Last BM:  9/9  Height:   Ht Readings from Last 1 Encounters:  11/24/14 5' 8"  (1.727 m)    Weight:   Wt Readings from Last 1 Encounters:  11/24/14 163 lb (73.936 kg)    Ideal Body Weight:  70 kg (kg)  BMI:  Body mass index is 24.79 kg/(m^2).  Estimated Nutritional Needs:   Kcal:  3167-4255  Protein:  110-120 grams  Fluid:  2.2-2.5 L/day  EDUCATION NEEDS:   No education needs identified at this time     Jarome Matin, RD, LDN Inpatient Clinical Dietitian Pager # [phone] After hours/weekend pager # [phone]

## 2014-12-11 NOTE — Consult Note (Signed)
[place] [facility] Consult   Reason for Consult:  Depression and refusing medical treatment Referring Physician:  Dr. Rockne Menghini Patient Identification: Darryl Griffith MRN:  [id number] Principal Diagnosis: SBO (small bowel obstruction) Diagnosis:   Patient Active Problem List   Diagnosis Date Noted  . Pressure ulcer [L89.90] 12/11/2014  . Fluid collection at surgical site [T88.8XXA]   . Small bowel obstruction [K56.69]   . Hypokalemia [E87.6] 11/25/2014  . Spinal stenosis [M48.00] 11/25/2014  . Essential (primary) hypertension [I10] 11/25/2014  . Mixed hyperlipidemia [E78.2] 11/25/2014  . Malnutrition of moderate degree [E44.0] 11/08/2014  . 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 Abscess, retroperitoneal [K68.19]   . Melena [K92.1] 09/18/2014  . Hyponatremia [E87.1] 09/18/2014  . Hyperglycemia [R73.9] 09/18/2014  . Hypoalbuminemia [E88.09] 09/18/2014  . Anemia of chronic disease [D63.8] 09/18/2014  . Hypertension [I10]   . Left Retroperitoneal 12 cm grade III Liposarcoma s/p Resection and left nephrectomy 07/13/2014 with positive margins [C48.0]     Total Time spent with patient: 1 hour  Subjective:   Darryl Griffith is a 69 y.o. male patient admitted with depression secondary to medical problems and complications especially small bowel obstruction.  HPI:  Darryl Griffith is an 69 y.o. male seen face-to-face for psychiatric consultation along with psychiatric social service and also reviewed chart. Psychiatric consultation was requested secondary to increased symptoms of depression, isolation, withdrawn, lack of energy and motivation along with poor compliance with treatment. Patient reported he felt that way yesterday but today he is feeling little better and has been compliant with the medication management and treatment. Patient endorses feeling down because of ongoing complications of his medical problem and recurrent small bowel obstruction. Patient stated  he needed 3 people to go to bathroom which is frustrating to him. Patient appeared with depressed mood and restricted affect. Patient has a low-volume of speech but pleasant during my evaluation. Spoke with the patient wife who was at bedside and seems to be very supportive to him. Patient has denied suicidal/homicidal ideation, intention or plans. Patient has no previous history of psychiatric inpatient hospitalization or outpatient treatment. Patient stated he is able to take liquid diet at this time.   HPI Elements:    Location:  Depression. Quality:  Fair to poor. Severity:  Loss of interest and energy and motivation. Timing:  Chronic and recurrent medical complications. Duration:  Few months. Context:  Psychosocial and complicated medical problems.  Past Medical History:  Past Medical History  Diagnosis Date  . Hypertension   . Anginal pain     hx of CP saw Dr Wynonia Lawman 09/20/2013 H&P per chart   . Left Retroperitoneal mass s/p resection/L nephrectomy 07/13/2014   . Postoperative anemia due to acute blood loss 07/15/2014  . SBO (small bowel obstruction) 08/01/2014, 10/11/14  . Retroperitoneal fluid collection   . 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 Impaired fasting glucose 11/25/2014  . Benign neoplasm of colon 11/25/2014  . Actinic keratosis 11/25/2014  . Mixed hyperlipidemia 11/25/2014    Past Surgical History  Procedure Laterality Date  . Surgery for arm fracture Right 1952  . Knee arthroscopy Right 1970's  . Colonoscopy with propofol N/A 10/20/2013    Procedure: COLONOSCOPY WITH PROPOFOL;  Surgeon: Lear Ng, MD;  Location: [facility];  Service: Endoscopy;  Laterality: N/A;  . Hot hemostasis N/A 10/20/2013    Procedure: HOT HEMOSTASIS (ARGON PLASMA COAGULATION/BICAP);  Surgeon: Lear Ng,  MD;  Location: [facility];  Service: Endoscopy;  Laterality: N/A;  . Tigger finger release       per H&P per Dr Wynonia Lawman 09/20/2013 (on chart)  . Resection of retroperitoneal mass Left 07/13/2014    Procedure: RESECTION OF left RETROPERITONEAL MASS;  Surgeon: Stark Klein, MD;  Location: [facility];  Service: General;  Laterality: Left;  . Esophagogastroduodenoscopy (egd) with propofol N/A 09/19/2014    Procedure: ESOPHAGOGASTRODUODENOSCOPY (EGD) WITH PROPOFOL;  Surgeon: Wilford Corner, MD;  Location: [facility];  Service: Endoscopy;  Laterality: N/A;  . Laparotomy N/A 11/29/2014    Procedure: EXPLORATORY LAPAROTOMY SMALL BOWEL RESECTION;  Surgeon: Stark Klein, MD;  Location: [facility];  Service: General;  Laterality: N/A;  . Irrigation and debridement abscess N/A 11/29/2014    Procedure: IRRIGATION AND DEBRIDEMENT BACK ABSCESS;  Surgeon: Stark Klein, MD;  Location: [facility];  Service: General;  Laterality: N/A;   Family History:  Family History  Problem Relation Age of Onset  . Adopted: Yes  . Family history unknown: Yes   Social History:  History  Alcohol Use No     History  Drug Use No    Social History   Social History  . Marital Status: Married    Spouse Name: [name]  . Number of Children: 4  . Years of Education: N/A   Occupational History  . Retired Armed forces operational officer    Social History Main Topics  . Smoking status: Never Smoker   . Smokeless tobacco: Never Used  . Alcohol Use: No  . Drug Use: No  . Sexual Activity: Not Currently   Other Topics Concern  . None   Social History Narrative   Married.  Lives in [place] with wife.   Additional Social History:                          Allergies:   Allergies  Allergen Reactions  . Compazine [Prochlorperazine] Other (See Comments)    Made pt really lethargic, and sleepy    Labs:  Results for orders placed or performed during the hospital encounter of 11/24/14 (from the past 48 hour(s))  Glucose, capillary     Status: Abnormal   Collection Time: 12/09/14 12:08 PM  Result Value Ref Range    Glucose-Capillary 152 (H) 65 - 99 mg/dL   Comment 1 Notify RN    Comment 2 Document in Chart   Glucose, capillary     Status: Abnormal   Collection Time: 12/09/14  6:26 PM  Result Value Ref Range   Glucose-Capillary 160 (H) 65 - 99 mg/dL   Comment 1 Notify RN   Glucose, capillary     Status: Abnormal   Collection Time: 12/10/14 12:11 AM  Result Value Ref Range   Glucose-Capillary 145 (H) 65 - 99 mg/dL  Glucose, capillary     Status: Abnormal   Collection Time: 12/10/14  6:04 AM  Result Value Ref Range   Glucose-Capillary 158 (H) 65 - 99 mg/dL  Glucose, capillary     Status: Abnormal   Collection Time: 12/10/14  8:20 AM  Result Value Ref Range   Glucose-Capillary 145 (H) 65 - 99 mg/dL  Glucose, capillary     Status: Abnormal   Collection Time: 12/10/14  1:04 PM  Result Value Ref Range   Glucose-Capillary 150 (H) 65 - 99 mg/dL  Brain natriuretic peptide     Status: Abnormal   Collection Time: 12/10/14  5:01 PM  Result Value Ref Range  B Natriuretic Peptide 103.3 (H) 0.0 - 100.0 pg/mL  Basic metabolic panel     Status: Abnormal   Collection Time: 12/10/14  5:01 PM  Result Value Ref Range   Sodium 130 (L) 135 - 145 mmol/L   Potassium 5.1 3.5 - 5.1 mmol/L   Chloride 105 101 - 111 mmol/L   CO2 20 (L) 22 - 32 mmol/L   Glucose, Bld 154 (H) 65 - 99 mg/dL   BUN 30 (H) 6 - 20 mg/dL   Creatinine, Ser 0.99 0.61 - 1.24 mg/dL   Calcium 7.7 (L) 8.9 - 10.3 mg/dL   GFR calc non Af Amer >60 >60 mL/min   GFR calc Af Amer >60 >60 mL/min    Comment: (NOTE) The eGFR has been calculated using the CKD EPI equation. This calculation has not been validated in all clinical situations. eGFR's persistently <60 mL/min signify possible Chronic Kidney Disease.    Anion gap 5 5 - 15  CBC     Status: Abnormal   Collection Time: 12/10/14  5:01 PM  Result Value Ref Range   WBC 7.1 4.0 - 10.5 K/uL   RBC 2.98 (L) 4.22 - 5.81 MIL/uL   Hemoglobin 8.3 (L) 13.0 - 17.0 g/dL   HCT 25.3 (L) 39.0 - 52.0 %    MCV 84.9 78.0 - 100.0 fL   MCH 27.9 26.0 - 34.0 pg   MCHC 32.8 30.0 - 36.0 g/dL   RDW 20.0 (H) 11.5 - 15.5 %   Platelets 558 (H) 150 - 400 K/uL  Glucose, capillary     Status: Abnormal   Collection Time: 12/10/14  6:36 PM  Result Value Ref Range   Glucose-Capillary 157 (H) 65 - 99 mg/dL   Comment 1 Notify RN   Glucose, capillary     Status: Abnormal   Collection Time: 12/11/14 12:18 AM  Result Value Ref Range   Glucose-Capillary 160 (H) 65 - 99 mg/dL  Comprehensive metabolic panel     Status: Abnormal   Collection Time: 12/11/14  6:10 AM  Result Value Ref Range   Sodium 130 (L) 135 - 145 mmol/L   Potassium 4.8 3.5 - 5.1 mmol/L   Chloride 101 101 - 111 mmol/L   CO2 19 (L) 22 - 32 mmol/L   Glucose, Bld 148 (H) 65 - 99 mg/dL   BUN 32 (H) 6 - 20 mg/dL   Creatinine, Ser 1.09 0.61 - 1.24 mg/dL   Calcium 8.2 (L) 8.9 - 10.3 mg/dL   Total Protein 5.9 (L) 6.5 - 8.1 g/dL   Albumin 2.5 (L) 3.5 - 5.0 g/dL   AST 17 15 - 41 U/L   ALT 9 (L) 17 - 63 U/L   Alkaline Phosphatase 111 38 - 126 U/L   Total Bilirubin 0.4 0.3 - 1.2 mg/dL   GFR calc non Af Amer >60 >60 mL/min   GFR calc Af Amer >60 >60 mL/min    Comment: (NOTE) The eGFR has been calculated using the CKD EPI equation. This calculation has not been validated in all clinical situations. eGFR's persistently <60 mL/min signify possible Chronic Kidney Disease.    Anion gap 10 5 - 15  Magnesium     Status: None   Collection Time: 12/11/14  6:10 AM  Result Value Ref Range   Magnesium 1.9 1.7 - 2.4 mg/dL  Phosphorus     Status: None   Collection Time: 12/11/14  6:10 AM  Result Value Ref Range   Phosphorus 3.9 2.5 - 4.6 mg/dL  CBC     Status: Abnormal   Collection Time: 12/11/14  6:10 AM  Result Value Ref Range   WBC 8.3 4.0 - 10.5 K/uL   RBC 3.25 (L) 4.22 - 5.81 MIL/uL   Hemoglobin 9.0 (L) 13.0 - 17.0 g/dL   HCT 27.4 (L) 39.0 - 52.0 %   MCV 84.3 78.0 - 100.0 fL   MCH 27.7 26.0 - 34.0 pg   MCHC 32.8 30.0 - 36.0 g/dL   RDW 20.0  (H) 11.5 - 15.5 %   Platelets 638 (H) 150 - 400 K/uL  Differential     Status: Abnormal   Collection Time: 12/11/14  6:10 AM  Result Value Ref Range   Neutrophils Relative % 76 43 - 77 %   Lymphocytes Relative 10 (L) 12 - 46 %   Monocytes Relative 11 3 - 12 %   Eosinophils Relative 2 0 - 5 %   Basophils Relative 1 0 - 1 %   Neutro Abs 6.3 1.7 - 7.7 K/uL   Lymphs Abs 0.8 0.7 - 4.0 K/uL   Monocytes Absolute 0.9 0.1 - 1.0 K/uL   Eosinophils Absolute 0.2 0.0 - 0.7 K/uL   Basophils Absolute 0.1 0.0 - 0.1 K/uL   RBC Morphology TARGET CELLS    WBC Morphology MILD LEFT SHIFT (1-5% METAS, OCC MYELO, OCC BANDS)   Triglycerides     Status: None   Collection Time: 12/11/14  6:10 AM  Result Value Ref Range   Triglycerides 123 <150 mg/dL    Comment: Performed at [facility]  Prealbumin     Status: Abnormal   Collection Time: 12/11/14  6:10 AM  Result Value Ref Range   Prealbumin 6.0 (L) 18 - 38 mg/dL    Comment: Performed at [facility]  CK     Status: Abnormal   Collection Time: 12/11/14  6:10 AM  Result Value Ref Range   Total CK 13 (L) 49 - 397 U/L  Glucose, capillary     Status: Abnormal   Collection Time: 12/11/14  6:58 AM  Result Value Ref Range   Glucose-Capillary 137 (H) 65 - 99 mg/dL    Vitals: Blood pressure 134/83, pulse 112, temperature 99.3 F (37.4 C), temperature source Oral, resp. rate 30, height 5' 8"  (1.727 m), weight 73.936 kg (163 lb), SpO2 100 %.  Risk to Self: Is patient at risk for suicide?: No Risk to Others:   Prior Inpatient Therapy:   Prior Outpatient Therapy:    Current Facility-Administered Medications  Medication Dose Route Frequency Provider Last Rate Last Dose  . 0.9 %  sodium chloride infusion   Intravenous Continuous Lynelle Doctor, RPH   Stopped at 12/10/14 1900  . 0.9 %  sodium chloride infusion   Intravenous Once Venetia Maxon Rama, MD      . acetaminophen (TYLENOL) solution 650 mg  650 mg Oral Q4H PRN Rhetta Mura Schorr, NP      .  acetaminophen (TYLENOL) suppository 650 mg  650 mg Rectal Q6H PRN Theressa Millard, MD      . acetaminophen (TYLENOL) tablet 650 mg  650 mg Oral Q6H Stark Klein, MD      . alum & mag hydroxide-simeth (MAALOX/MYLANTA) 200-200-20 MG/5ML suspension 30 mL  30 mL Oral Q6H PRN Michael Boston, MD      . baclofen (LIORESAL) tablet 10 mg  10 mg Oral TID PRN Stark Klein, MD      . bisacodyl (DULCOLAX) suppository 10 mg  10  mg Rectal Daily Michael Boston, MD   10 mg at 12/11/14 1000  . clonazepam (KLONOPIN) disintegrating tablet 0.5 mg  0.5 mg Oral BID PRN Venetia Maxon Rama, MD   0.5 mg at 12/10/14 1439  . DAPTOmycin (CUBICIN) 600 mg in sodium chloride 0.9 % IVPB  600 mg Intravenous Q24H Hosie Poisson, MD   600 mg at 12/10/14 1330  . TPN (CLINIMIX-E) Adult   Intravenous Continuous TPN Lynelle Doctor, RPH 93 mL/hr at 12/10/14 1730     And  . fat emulsion 20 % infusion 240 mL  240 mL Intravenous Continuous TPN Anh P Pham, RPH 10 mL/hr at 12/10/14 1730 240 mL at 12/10/14 1730  . TPN (CLINIMIX-E) Adult   Intravenous Continuous TPN Anh P Pham, RPH       And  . fat emulsion 20 % infusion 240 mL  240 mL Intravenous Continuous TPN Anh P Pham, RPH      . heparin injection 5,000 Units  5,000 Units Subcutaneous 3 times per day Alphonsa Overall, MD   5,000 Units at 12/11/14 0710  . hydrALAZINE (APRESOLINE) injection 5 mg  5 mg Intravenous Q6H PRN Hosie Poisson, MD      . insulin aspart (novoLOG) injection 0-9 Units  0-9 Units Subcutaneous 4 times per day Lynelle Doctor, RPH   1 Units at 12/11/14 0710  . lip balm (CARMEX) ointment 1 application  1 application Topical BID Michael Boston, MD   1 application at 09/64/38 1000  . LORazepam (ATIVAN) injection 0.5 mg  0.5 mg Intravenous QHS PRN Caren Griffins, MD   0.5 mg at 12/06/14 2215  . magic mouthwash  15 mL Oral QID PRN Michael Boston, MD      . megestrol (MEGACE) 400 MG/10ML suspension 400 mg  400 mg Oral Daily Stark Klein, MD      . menthol-cetylpyridinium (CEPACOL) lozenge 3 mg  1  lozenge Oral PRN Michael Boston, MD      . meropenem (MERREM) 1 g in sodium chloride 0.9 % 100 mL IVPB  1 g Intravenous Q8H Anh P Pham, RPH   1 g at 12/11/14 1017  . metoprolol (LOPRESSOR) injection 5 mg  5 mg Intravenous Q6H PRN Michael Boston, MD   5 mg at 12/07/14 0616  . ondansetron (ZOFRAN) tablet 4 mg  4 mg Oral Q6H PRN Theressa Millard, MD      . oxyCODONE (Oxy IR/ROXICODONE) immediate release tablet 5-10 mg  5-10 mg Oral Q4H PRN Stark Klein, MD      . pantoprazole (PROTONIX) injection 40 mg  40 mg Intravenous Q12H Stark Klein, MD   40 mg at 12/11/14 1016  . phenol (CHLORASEPTIC) mouth spray 1 spray  1 spray Mouth/Throat PRN Caren Griffins, MD   1 spray at 11/25/14 1154  . promethazine (PHENERGAN) injection 6.25-12.5 mg  6.25-12.5 mg Intravenous Q4H PRN Michael Boston, MD      . sodium chloride 0.9 % injection 10-40 mL  10-40 mL Intracatheter PRN Md Ccs, MD   10 mL at 12/07/14 0512  . traMADol (ULTRAM) tablet 50-100 mg  50-100 mg Oral Q6H PRN Stark Klein, MD        Musculoskeletal: Strength & Muscle Tone: decreased Gait & Station: unable to stand Patient leans: N/A  Psychiatric Specialty Exam: Physical Exam as per history and physical   ROS complains about generalized weakness, lack of energy, lack of motivation and interest, also reported depression with frustration patient denies nausea or vomiting shortness  of breath or chest pain No Fever-chills, No Headache, No changes with Vision or hearing, reports vertigo No problems swallowing food or Liquids, No Chest pain, Cough or Shortness of Breath, No Nausea or Vommitting, recurrent constipation No Blood in stool or Urine, No dysuria, No new skin rashes or bruises, No new joints pains-aches,  No new weakness, tingling, numbness in any extremity, No recent weight gain or loss, No polyuria, polydypsia or polyphagia,   A full 10 point Review of Systems was done, except as stated above, all other Review of Systems were negative.    Blood pressure 134/83, pulse 112, temperature 99.3 F (37.4 C), temperature source Oral, resp. rate 30, height 5' 8"  (1.727 m), weight 73.936 kg (163 lb), SpO2 100 %.Body mass index is 24.79 kg/(m^2).  General Appearance: Guarded  Eye Contact::  Good  Speech:  Clear and Coherent and Slow  Volume:  Decreased  Mood:  Depressed  Affect:  Constricted and Depressed  Thought Process:  Coherent and Goal Directed  Orientation:  Full (Time, Place, and Person)  Thought Content:  WDL  Suicidal Thoughts:  No  Homicidal Thoughts:  No  Memory:  Immediate;   Fair Recent;   Fair  Judgement:  Intact  Insight:  Fair  Psychomotor Activity:  Decreased  Concentration:  Fair  Recall:  [employer] of Knowledge:Good  Language: Good  Akathisia:  Negative  Handed:  Right  AIMS (if indicated):     Assets:  Communication Skills Desire for Improvement Financial Resources/Insurance Housing Intimacy Leisure Time Resilience Social Support Transportation  ADL's:  Impaired  Cognition: Impaired,  Mild  Sleep:      Medical Decision Making: Review of Psycho-Social Stressors (1), Review or order clinical lab tests (1), New Problem, with no additional work-up planned (3), Review of Last Therapy Session (1), Review or order medicine tests (1), Review of Medication Regimen & Side Effects (2) and Review of New Medication or Change in Dosage (2)  Treatment Plan Summary: Daily contact with patient to assess and evaluate symptoms and progress in treatment and Medication management  Plan:  Will start Zoloft 25 mg daily for depression which can be increased up to 200 mg has patient tolerated and benefits clinically Patient encouraged to be compliant with the treatment while in the hospital We provide supportive therapy Patient does not meet criteria for psychiatric inpatient admission. Supportive therapy provided about ongoing stressors.    Disposition: Patient benefit from medication management and may needed  skilled nursing facility or palliative care  Kyrie Fludd,JANARDHAHA R. 12/11/2014 10:54 AM

## 2014-12-11 NOTE — Progress Notes (Addendum)
Progress Note   Darryl Griffith OIN:[id number] DOB: 03-24-1946 DOA: 11/24/2014 PCP: Gennette Pac, MD   Brief Narrative:   Darryl Griffith is an 69 y.o. 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 General surgery was consulted on admission. Patient did not improve with conservative measures and underwent surgery 11/29/14: incision and drainage of left back abscess, exploratory laparotomy, lysis of adhesions, small bowel resection and placement of drain into left retroperitoneal abscess.   Assessment/Plan:   Principal problem:  SBO status post resection with lysis of adhesions/postoperative ileus -Underwent surgical repair on 8/31 with exploratory laparotomy, lysis of adhesions, small bowel resection, placement of drain into left retroperitoneal abscess. Wound opened 12/10/14.  Surgery plans to place wound VAC. - Gen. surgery following.  Diet advanced to clears. NG tube out.  - Remains on TNA. Will wean when diet advanced further. - Slow resolution of postoperative ileus. - Weaning PCA Dilaudid.  Active problems:  Acute pulmonary edema - BNP only mildly elevated at 103.3.  CXR 12/10/14 showed findings consistent with edema.   - Lasix 40 mg given.  Respiratory status improved.  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 Follow-up oncology post discharge.  Left retroperitoneal and left upper quadrant fluid collection  - Cultures initially positive for VRE. Most recent cultures from 11/29/14 were negative. - Status post IR drainage 11/27/14. - ID following. Antibiotics per ID recommendations, antibiotics changed to  daptomycin/meropenem per surgery 12/10/14.  Pressure sore  - The patient needs extreme encouragement to get out of bed. Seems depressed/withdrawn/disengaged. - Wound care nurse to evaluate.  Depression - Psychiatry consult requested. Depressed/flat affect. Has been refusing care including getting out of bed.  HTN - Continue hydralazine as needed.  Hypokalemia - Resolved.  Hypomagnesemia - Resolved.  Symptomatic Anemia - Monitoring blood counts.  - Status post 2 units of PRBCs 11/27/14, 1 unit 12/01/14, 1 unit 12/05/14 and 1 unit 12/08/14.  Hyponatremia - Monitor sodium, improved.   Neutropenia - Resolved. Felt to be related to treatment side Vioxx versus antibiotics.   Tachycardia - Multi-factorial with infection, anemia contributory.  DVT prophylaxis - SCDs.  Code Status: Full Code Family Communication: Daughter Barnett Applebaum updated at bedside) 12/10/14.  Disposition Plan: Unclear at present, remains on TNA. Patient's daughter wants him transferred to [place]. Attempted to transfer him however he was not accepted on transfer for given that this would be a lateral transfer of care. Spoke with Dr Evonnie Dawes who recommends consideration for transfer to a [facility] based L-T[facility].  IV Access:    PICC line placed 10/12/14   Procedures and diagnostic studies:   Ct Chest W Contrast  11/23/2014   CLINICAL DATA:  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  CONTRAST:  13mL OMNIPAQUE IOHEXOL 300 MG/ML  SOLN  COMPARISON:  11/07/2014  FINDINGS: CT CHEST FINDINGS  Thoracic inlet: No neck base or axillary masses or adenopathy.  Normal thyroid.  Mediastinum and hila: Heart normal in size and configuration. Mild prominence of the ascending aorta measuring 4 cm in diameter. 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 Right PICC tip projects in the lower superior vena cava. No mediastinal or hilar masses or pathologically enlarged lymph nodes.  Lungs and pleura: No lung masses or suspicious nodules. Small left pleural effusion associated with mild dependent left lower lobe atelectasis. Small focus of reticular nodular opacity in the posterior right lower lobe is likely additional atelectasis or scarring. There is no lung consolidation or edema. No pneumothorax.  CT ABDOMEN AND PELVIS FINDINGS  There is a fluid collection that extends from the posterior medial left superior retroperitoneum, adjacent to the spleen, to the lateral margin of the superior left psoas muscle. This measures 4.9 x 2.2 cm transversely, previously 5.6 x 2.6 cm. It measures 7.6 cm an approximate superior inferior length, previously 9.2 cm. Fluid attenuation and multiple blastic and clips lie in the left upper quadrant between the posterior wall of the stomach and upper abdominal aorta. There is thickening of this portion of the stomach wall. These changes partly surround the pancreatic tail and surround the left diaphragmatic crus. No left adrenal gland is visible. Left kidney is surgically absent.  Liver and spleen:  Unremarkable.  Gallbladder and biliary tree: Unremarkable.  Pancreas: There is some heterogeneous lower attenuation along the pancreatic tail adjacent to the left upper quadrant surgical changes and low-attenuation soft tissue. This is similar to the prior exam, likely reactive edema. No other pancreatic abnormality.  Right adrenal glands: Normal.  Right kidney, ureter, bladder: 15 mm midpole cyst. No other right renal masses, no stones and no hydronephrosis. Normal  ureters. Unremarkable bladder.  Lymph nodes:  No pathologically enlarged lymph nodes.  Ascites:  Trace ascites in the pelvis.  Gastrointestinal: There is a partial small bowel obstruction. The transition point is in the left upper adjacent to the postsurgical changes and residual left retroperitoneal fluid collection. Small bowel proximal to the transition is dilated to a maximum of 5.3 cm. Small bowel distal to this is decompressed. The colon is partly decompressed. There are scattered colonic diverticula mostly along the sigmoid colon. No diverticulitis. Normal-sized appendix is visualized. No bowel wall thickening or inflammatory changes. No free air.  Abdominal wall: There is edema in the subcutaneous fat mostly along the flanks. There is midline abdominal incision. No hernias.  Vascular: Atherosclerotic changes are noted along the infrarenal abdominal aorta and branch vessels. No aneurysm.  MUSCULOSKELETAL FINDINGS  No osteoblastic or osteolytic lesions. Degenerative changes noted of the visualized spine. Bones are demineralized.  IMPRESSION: 1. Residual left retroperitoneal and left upper quadrant collection associated with postsurgical changes. The defined residual collection, along the posterior medial left retroperitoneum abutting the spleen and left psoas muscle, is smaller, now measuring 4.9 x 2.3 x 7.6 cm, previously 5.6 x 2.6 x 9.2 cm. Left upper quadrant postsurgical and inflammatory changes include thickening of the posterior wall of the stomach, stable. There are numerous vascular clips in the left retroperitoneum and left posterior medial upper quadrant. 2. Fairly high-grade partial small bowel obstruction with the transition point in the posterior medial left upper quadrant adjacent to the residual collection and postsurgical changes. This is similar to prior study. 3. No convincing local residual retroperitoneal sarcoma. 4. No evidence of metastatic disease from the sarcoma.   Electronically  Signed   By: Shanon Brow  Ormond M.D.   On: 11/23/2014 14:36   Ct Abdomen Pelvis W Contrast  11/23/2014   CLINICAL DATA:  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  CONTRAST:  145mL OMNIPAQUE IOHEXOL 300 MG/ML  SOLN  COMPARISON:  11/07/2014  FINDINGS: CT CHEST FINDINGS  Thoracic inlet: No neck base or axillary masses or adenopathy. Normal thyroid.  Mediastinum and hila: Heart normal in size and configuration. Mild prominence of the ascending aorta measuring 4 cm in diameter. 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 Right PICC tip projects in the lower superior vena cava. No mediastinal or hilar masses or pathologically enlarged lymph nodes.  Lungs and pleura: No lung masses or suspicious nodules. Small left pleural effusion associated with mild dependent left lower lobe atelectasis. Small focus of reticular nodular opacity in the posterior right lower lobe is likely additional atelectasis or scarring. There is no lung consolidation or edema. No pneumothorax.  CT ABDOMEN AND PELVIS FINDINGS  There is a fluid collection that extends from the posterior medial left superior retroperitoneum, adjacent to the spleen, to the lateral margin of the superior left psoas muscle. This measures 4.9 x 2.2 cm transversely, previously 5.6 x 2.6 cm. It measures 7.6 cm an approximate superior inferior length, previously 9.2 cm. Fluid attenuation and multiple blastic and clips lie in the left upper quadrant between the posterior wall of the stomach and upper abdominal aorta.  There is thickening of this portion of the stomach wall. These changes partly surround the pancreatic tail and surround the left diaphragmatic crus. No left adrenal gland is visible. Left kidney is surgically absent.  Liver and spleen:  Unremarkable.  Gallbladder and biliary tree: Unremarkable.  Pancreas: There is some heterogeneous lower attenuation along the pancreatic tail adjacent to the left upper quadrant surgical changes and low-attenuation soft tissue. This is similar to the prior exam, likely reactive edema. No other pancreatic abnormality.  Right adrenal glands: Normal.  Right kidney, ureter, bladder: 15 mm midpole cyst. No other right renal masses, no stones and no hydronephrosis. Normal ureters. Unremarkable bladder.  Lymph nodes:  No pathologically enlarged lymph nodes.  Ascites:  Trace ascites in the pelvis.  Gastrointestinal: There is a partial small bowel obstruction. The transition point is in the left upper adjacent to the postsurgical changes and residual left retroperitoneal fluid collection. Small bowel proximal to the transition is dilated to a maximum of 5.3 cm. Small bowel distal to this is decompressed. The colon is partly decompressed. There are scattered colonic diverticula mostly along the sigmoid colon. No diverticulitis. Normal-sized appendix is visualized. No bowel wall thickening or inflammatory changes. No free air.  Abdominal wall: There is edema in the subcutaneous fat mostly along the flanks. There is midline abdominal incision. No hernias.  Vascular: Atherosclerotic changes are noted along the infrarenal abdominal aorta and branch vessels. No aneurysm.  MUSCULOSKELETAL FINDINGS  No osteoblastic or osteolytic lesions. Degenerative changes noted of the visualized spine. Bones are demineralized.  IMPRESSION: 1. Residual left retroperitoneal and left upper quadrant collection associated with postsurgical changes. The defined residual collection, along the posterior medial left  retroperitoneum abutting the  spleen and left psoas muscle, is smaller, now measuring 4.9 x 2.3 x 7.6 cm, previously 5.6 x 2.6 x 9.2 cm. Left upper quadrant postsurgical and inflammatory changes include thickening of the posterior wall of the stomach, stable. There are numerous vascular clips in the left retroperitoneum and left posterior medial upper quadrant. 2. Fairly high-grade partial small bowel obstruction with the transition point in the posterior medial left upper quadrant adjacent to the residual collection and postsurgical changes. This is similar to prior study. 3. No convincing local residual retroperitoneal sarcoma. 4. No evidence of metastatic disease from the sarcoma.   Electronically Signed   By: Lajean Manes M.D.   On: 11/23/2014 14:36   Ct Aspiration  11/28/2014   CLINICAL DATA:  Recurrent left retroperitoneal fluid collection after previous resection of sarcoma. The patient now presents with a small bowel obstruction. Aspiration of the fluid collection has been requested prior to planned surgery to determine if there is any evidence of infection. This fluid collection was last aspirated on 11/10/2014 revealing vancomycin resistant Enterococcus and also previously contained a percutaneous drain for several months.  EXAM: CT GUIDED CATHETER DRAINAGE OF LEFT RETROPERITONEAL FLUID COLLECTION  ANESTHESIA/SEDATION: 1.5  Mg IV Versed; 75 mcg IV Fentanyl  Total Moderate Sedation Time: 15 minutes  PROCEDURE: The procedure risks, benefits, and alternatives were explained to the patient. Questions regarding the procedure were encouraged and answered. The patient understands and consents to the procedure. A time-out was performed prior to the procedure.  The left flank region was prepped with Betadine in a sterile fashion, and a sterile drape was applied covering the operative field. A sterile gown and sterile gloves were used for the procedure. Local anesthesia was provided with 1% Lidocaine.  Under CT  guidance, a 5 [place] Yueh centesis catheter was advanced into the left retroperitoneal space. Fluid aspiration was performed through the 5 French catheter. The catheter was then removed. Additional CT was performed.  COMPLICATIONS: None  FINDINGS: Recurrent fluid collection in the medial left retroperitoneal space again identified, identical in appearance to scanning just prior to the previous aspiration procedure on 08/12. Approximately 10 mL of pink colored, turbid fluid was able to be aspirated. Material was sent for culture analysis. Postprocedural CT shows decrease in size of the fluid collection.  IMPRESSION: CT-guided aspiration of recurrent medial left retroperitoneal fluid collection yielding 10 mL of pink colored, turbid fluid. Fluid was sent for culture analysis.   Electronically Signed   By: Aletta Edouard M.D.   On: 11/28/2014 08:21   Ir Venipuncture 58yrs/older By Md  12/01/2014   CLINICAL DATA:  Anemia, difficult access, poor peripheral veins, access for blood transfusion  EXAM: ULTRASOUND GUIDANCE FOR VASCULAR ACCESS  PERIPHERAL IV START BY PHYSICIAN.  Date:  9/2/20169/05/2014 5:13 pm  Radiologist:  M. Daryll Brod, MD  Guidance:  Ultrasound  FLUOROSCOPY TIME:  None.  MEDICATIONS AND MEDICAL HISTORY: None.  ANESTHESIA/SEDATION: None.  CONTRAST:  None.  COMPLICATIONS: None immediate  PROCEDURE: Informed consent was obtained from the patient following explanation of the procedure, risks, benefits and alternatives. The patient understands, agrees and consents for the procedure. All questions were addressed. A time out was performed.  Maximal barrier sterile technique utilized including caps, mask, sterile gowns, sterile gloves, large sterile drape, hand hygiene, and ChloraPrep  Preliminary ultrasound performed. Left basilic vein demonstrated. Under sterile conditions and local anesthesia, a 16 gauge Angiocath was advanced into the left basilic vein under ultrasound. Position confirmed with ultrasound.  Images obtained  for documentation. Blood aspirated easily followed by saline flush. This was secured externally with a sterile dressing. Ready for use.  IMPRESSION: Successful ultrasound left upper extremity basilic vein peripheral IV start.   Electronically Signed   By: [name] M.D.   On: 12/01/2014 17:19   Ir US Guide Vasc Access Left  12/01/2014   CLINICAL DATA:  Anemia, difficult access, poor peripheral veins, access for blood transfusion  EXAM: ULTRASOUND GUIDANCE FOR VASCULAR ACCESS  PERIPHERAL IV START BY PHYSICIAN.  Date:  9/2/20169/05/2014 5:13 pm  Radiologist:  M. Daryll Brod, MD  Guidance:  Ultrasound  FLUOROSCOPY TIME:  None.  MEDICATIONS AND MEDICAL HISTORY: None.  ANESTHESIA/SEDATION: None.  CONTRAST:  None.  COMPLICATIONS: None immediate  PROCEDURE: Informed consent was obtained from the patient following explanation of the procedure, risks, benefits and alternatives. The patient understands, agrees and consents for the procedure. All questions were addressed. A time out was performed.  Maximal barrier sterile technique utilized including caps, mask, sterile gowns, sterile gloves, large sterile drape, hand hygiene, and ChloraPrep  Preliminary ultrasound performed. Left basilic vein demonstrated. Under sterile conditions and local anesthesia, a 16 gauge Angiocath was advanced into the left basilic vein under ultrasound. Position confirmed with ultrasound. Images obtained for documentation. Blood aspirated easily followed by saline flush. This was secured externally with a sterile dressing. Ready for use.  IMPRESSION: Successful ultrasound left upper extremity basilic vein peripheral IV start.   Electronically Signed   By: [name] M.D.   On: 12/01/2014 17:19   Dg Chest Port 1 View  12/10/2014   CLINICAL DATA:  Shortness of breath, tachycardia.  EXAM: PORTABLE CHEST - 1 VIEW  COMPARISON:  July 13, 2014.  FINDINGS: Stable cardiomediastinal silhouette. No pneumothorax is noted. Increased  bilateral upper lobe opacities are noted concerning for pneumonia or possibly edema. Increased mild left pleural effusion is noted. Right-sided PICC line is noted with distal tip overlying expected position of the SVC. Bony thorax is intact.  IMPRESSION: Increased bilateral upper lobe opacities are noted concerning for edema or possibly pneumonia. Increased mild left pleural effusion is noted.   Electronically Signed   By: Marijo Conception, M.D.   On: 12/10/2014 16:53   Dg Abd 2 Views  11/27/2014   CLINICAL DATA:  Followup small bowel obstruction  EXAM: ABDOMEN - 2 VIEW  COMPARISON:  11/26/2014  FINDINGS: The nasogastric tube is identified with tip in the stomach. The side port is below the GE junction. Dilated loops of small bowel and air-fluid levels are again noted. These measure up to 6.7 cm. Enteric contrast material is identified within the colon up to the level of the rectum.  IMPRESSION: 1. NG tube is within the stomach. 2. Persistent partial small bowel obstruction 3. Enteric contrast material is identified up the level of the rectum.   Electronically Signed   By: Kerby Moors M.D.   On: 11/27/2014 09:35   Dg Abd 2 Views  11/26/2014   CLINICAL DATA:  Abdominal distention with nausea and vomiting. Surgery in April. Small bowel obstruction symptoms.  EXAM: ABDOMEN - 2 VIEW  COMPARISON:  11/24/2014  FINDINGS: Upright and supine views. Upright view demonstrates persistent small bowel air-fluid levels, increased. Small left pleural effusion. There may be a nasogastric tube looped in the esophagus, incompletely imaged. Surgical clips in the upper abdomen. A metallic object projecting over the a lower chest is presumably external to the patient.  Supine images demonstrate small bowel distension. Example loop measures  6.7 cm today, versus 6.4 cm on the prior. Contrast is identified within the colon. No pneumatosis.  IMPRESSION: Similar small bowel obstruction pattern, without free intraperitoneal air or  other acute complication.  The nasogastric may be looped in the distal esophagus. Incompletely imaged. These results will be called to the ordering clinician or representative by the Radiologist Assistant, and communication documented in the PACS or zVision Dashboard.   Electronically Signed   By: Abigail Miyamoto M.D.   On: 11/26/2014 10:07   Dg Abd Acute W/chest  11/24/2014   CLINICAL DATA:  Bloating and nausea since yesterday. Vomiting today. History of bowel obstructions. History of retroperitoneal liposarcoma.  EXAM: DG ABDOMEN ACUTE W/ 1V CHEST  COMPARISON:  CT abdomen and pelvis 11/23/2014  FINDINGS: Right PICC catheter tip over the low SVC region. No pneumothorax. Linear fibrosis or atelectasis in the left lung base. No focal airspace disease or consolidation in the lungs. Normal heart size and pulmonary vascularity.  Surgical clips in the upper abdomen. Contrast material is demonstrated in the right colon. There are prominent gas-filled distended small bowel loops in the mid abdomen and multiple air-fluid levels are demonstrated on the upright view. Appearance is consistent with mechanical small bowel obstruction. No free intra-abdominal air. No radiopaque stones. Degenerative changes in the spine.  IMPRESSION: No evidence of active pulmonary disease. Fibrosis or atelectasis in the left lung base.  Gaseous distention of mid abdominal small bowel with multiple air-fluid levels consistent with mechanical small bowel obstruction.   Electronically Signed   By: Lucienne Capers M.D.   On: 11/24/2014 21:51   Dg Abd Portable 1v  11/28/2014   CLINICAL DATA:  Evaluate NG tube repositioning.  EXAM: PORTABLE ABDOMEN - 1 VIEW  COMPARISON:  11/27/2014  FINDINGS: The nasogastric tube has been repositioned. The tip is in the fundus of the stomach in the side port is below the GE junction. Surgical clips are noted in the left upper quadrant of the abdomen. Again noted are dilated loops of bowel measuring up to 5 cm.   IMPRESSION: 1. Nasogastric tube is in the stomach. 2. Persistent small bowel dilatation consistent with obstruction.   Electronically Signed   By: Kerby Moors M.D.   On: 11/28/2014 09:05   Dg Abd Portable 1v  11/26/2014   CLINICAL DATA:  Nasogastric tube placement  EXAM: PORTABLE ABDOMEN - 1 VIEW  COMPARISON:  Portable exam 1318 hours compared to 11/26/2014  FINDINGS: Tip of nasogastric tube projects over proximal stomach.  Numerous surgical clips in the perigastric region.  Multiple dilated loops of small bowel again identified compatible with small bowel obstruction.  Small amount of contrast within a decompressed colon.  No definite bowel wall thickening.  Lung bases clear.  IMPRESSION: Small bowel obstruction.  Tip of nasogastric tube projects over proximal stomach.   Electronically Signed   By: Lavonia Dana M.D.   On: 11/26/2014 16:49     Medical Consultants:    Infectious Disease: Carlyle Basques, MD  General Surgery: Alphonsa Overall, MD  Anti-Infectives:   Anti-infectives    Start     Dose/Rate Route Frequency Ordered Stop   12/10/14 1800  meropenem (MERREM) 1 g in sodium chloride 0.9 % 100 mL IVPB     1 g 200 mL/hr over 30 Minutes Intravenous Every 8 hours 12/10/14 1741     12/03/14 1300  DAPTOmycin (CUBICIN) 600 mg in sodium chloride 0.9 % IVPB     600 mg 224 mL/hr over 30 Minutes Intravenous  Every 24 hours 12/03/14 1157     11/29/14 0830  [MAR Hold]  ceFAZolin (ANCEF) IVPB 2 g/50 mL premix     (MAR Hold since 11/29/14 0825)   2 g 100 mL/hr over 30 Minutes Intravenous On call to O.R. 11/28/14 0850 11/29/14 0908   11/25/14 0000  metroNIDAZOLE (FLAGYL) IVPB 500 mg  Status:  Discontinued     500 mg 100 mL/hr over 60 Minutes Intravenous Every 8 hours 11/24/14 2355 12/10/14 1729   11/25/14 0000  linezolid (ZYVOX) IVPB 600 mg  Status:  Discontinued     600 mg 300 mL/hr over 60 Minutes Intravenous Every 12 hours 11/24/14 2355 12/03/14 1145      Subjective:   Darryl Griffith  remains withdrawn and minimally interactive. Denies that he is depressed.  When asked why he is refusing his treatments, says, "I acquiesce".  Passing flatus and reports that his bowels have moved.  Still having mid abdominal/incisional pain.  Had an episode of respiratory distress last night.  Improved after being given Lasix.   Objective:    Filed Vitals:   12/11/14 0000 12/11/14 0120 12/11/14 0400 12/11/14 0648  BP:  140/70  134/83  Pulse:  114  112  Temp:  99.1 F (37.3 C)  99.3 F (37.4 C)  TempSrc:  Oral  Oral  Resp: 27 26 26 24   Height:      Weight:      SpO2: 98% 96% 100% 100%    Intake/Output Summary (Last 24 hours) at 12/11/14 0801 Last data filed at 12/11/14 0649  Gross per 24 hour  Intake 4573.16 ml  Output   2870 ml  Net 1703.16 ml   Filed Weights   11/24/14 2020  Weight: 73.936 kg (163 lb)    Exam: Gen:  NAD, depressed affect, disengaged/with flat affect Cardiovascular: Tachycardic, No M/R/G Respiratory:  Lungs diminished Gastrointestinal:  Abdomen soft, NT/ND, + BS Wound: Dressed, not examined, drain in place with brown secretions Extremities:  No C/E/C   Data Reviewed:    Labs: Basic Metabolic Panel:  Recent Labs Lab 12/07/14 0512 12/10/14 1701 12/11/14 0610  NA 131* 130* 130*  K 4.5 5.1 4.8  CL 103 105 101  CO2 20* 20* 19*  GLUCOSE 148* 154* 148*  BUN 37* 30* 32*  CREATININE 1.12 0.99 1.09  CALCIUM 7.8* 7.7* 8.2*  MG 1.9  --  1.9  PHOS 3.3  --  3.9   GFR Estimated Creatinine Clearance: 61 mL/min (by C-G formula based on Cr of 1.09). Liver Function Tests:  Recent Labs Lab 12/07/14 0512 12/11/14 0610  AST 16 17  ALT 9* 9*  ALKPHOS 74 111  BILITOT 0.6 0.4  PROT 5.0* 5.9*  ALBUMIN 2.1* 2.5*   CBC:  Recent Labs Lab 12/05/14 0510 12/06/14 0500 12/08/14 0520 12/10/14 1701 12/11/14 0610  WBC 1.2* 1.9* 3.6* 7.1 8.3  NEUTROABS 0.2*  --  1.4*  --  6.3  HGB 6.6* 7.6* 7.2* 8.3* 9.0*  HCT 20.2* 23.4* 22.0* 25.3* 27.4*  MCV  82.1 82.7 84.0 84.9 84.3  PLT 315 377 430* 558* 638*   Cardiac Enzymes:  Recent Labs Lab 12/11/14 0610  CKTOTAL 13*   CBG:  Recent Labs Lab 12/10/14 0820 12/10/14 1304 12/10/14 1836 12/11/14 0018 12/11/14 0658  GLUCAP 145* 150* 157* 160* 137*   Lipid Profile: No results for input(s): CHOL, HDL, LDLCALC, TRIG, CHOLHDL, LDLDIRECT in the last 72 hours. Microbiology No results found for this or any previous visit (from the  past 240 hour(s)).   Medications:   . sodium chloride   Intravenous Once  . bisacodyl  10 mg Rectal Daily  . DAPTOmycin (CUBICIN)  IV  600 mg Intravenous Q24H  . heparin subcutaneous  5,000 Units Subcutaneous 3 times per day  . HYDROmorphone PCA 0.3 mg/mL   Intravenous 6 times per day  . insulin aspart  0-9 Units Subcutaneous 4 times per day  . lip balm  1 application Topical BID  . magnesium sulfate 1 - 4 g bolus IVPB  1 g Intravenous Once  . meropenem (MERREM) IV  1 g Intravenous Q8H  . methocarbamol (ROBAXIN)  IV  500 mg Intravenous 3 times per day  . pantoprazole (PROTONIX) IV  40 mg Intravenous Q12H   Continuous Infusions: . sodium chloride Stopped (12/10/14 1900)  . Marland KitchenTPN (CLINIMIX-E) Adult 93 mL/hr at 12/10/14 1730   And  . fat emulsion 240 mL (12/10/14 1730)    Time spent: 25 minutes.    LOS: 17 days   [facility] Pager [phone]. If unable to reach me by pager, please call my cell phone at [phone].  *Please refer to [email or web address], password [email or web address] to get updated schedule on who will round on this patient, as hospitalists switch teams weekly. If 7PM-7AM, please contact night-coverage at [email or web address], password [email or web address] for any overnight needs.  12/11/2014, 8:01 AM

## 2014-12-11 NOTE — Progress Notes (Signed)
Clinical Social Work  CSW rounded with psych MD together. Patient laying in bed and wife present. Patient admits to recent anxiety related to hospital stay but denies any depression. Patient reports he feels better since some medications have been DC. Patient is not desiring any treatment or medication at this time.   CSW spoke with [place] from [facility] (in [place]) who reports they have reviewed information but will need to know about antibiotics at DC and if TPN will continue. CSW will keep facility updated.  [place], [place] [phone]

## 2014-12-11 NOTE — Progress Notes (Signed)
ANTIBIOTIC CONSULT NOTE - INITIAL  Pharmacy Consult for merrem and daptomycin Indication: VRE, Retroperitoneal Abscess  Allergies  Allergen Reactions  . Compazine [Prochlorperazine] Other (See Comments)    Made pt really lethargic, and sleepy    Patient Measurements: Height: 5\' 8"  (172.7 cm) Weight: 163 lb (73.936 kg) IBW/kg (Calculated) : 68.4   Vital Signs: Temp: 99.3 F (37.4 C) (09/12 0648) Temp Source: Oral (09/12 0648) BP: 134/83 mmHg (09/12 0648) Pulse Rate: 112 (09/12 0648) Intake/Output from previous day: 09/11 0701 - 09/12 0700 In: 4573.2 [P.O.:330; I.V.:216.2; IV PXTGGYIRS:854] Out: 2870 [Urine:2850; Drains:20] Intake/Output from this shift:    Labs:  Recent Labs  12/10/14 1701 12/11/14 0610  WBC 7.1 8.3  HGB 8.3* 9.0*  PLT 558* 638*  CREATININE 0.99 1.09   Estimated Creatinine Clearance: 61 mL/min (by C-G formula based on Cr of 1.09). No results for input(s): VANCOTROUGH, VANCOPEAK, VANCORANDOM, GENTTROUGH, GENTPEAK, GENTRANDOM, TOBRATROUGH, TOBRAPEAK, TOBRARND, AMIKACINPEAK, AMIKACINTROU, AMIKACIN in the last 72 hours.   Microbiology: Recent Results (from the past 720 hour(s))  Culture, routine-abscess     Status: None   Collection Time: 11/27/14 12:22 PM  Result Value Ref Range Status   Specimen Description   Final    ABSCESS ASPIRATION OF RECURRENT LEFT RETROPERITONEAL FLUID. Nephrectomy   Special Requests Normal  Final   Gram Stain   Final    ABUNDANT WBC PRESENT,BOTH PMN AND MONONUCLEAR NO SQUAMOUS EPITHELIAL CELLS SEEN NO ORGANISMS SEEN Performed at [facility]    Culture   Final    NO GROWTH 3 DAYS Performed at [facility]    Report Status 11/30/2014 FINAL  Final  Anaerobic culture     Status: None   Collection Time: 11/27/14 12:22 PM  Result Value Ref Range Status   Specimen Description ABSCESS LEFT RETROPERITONEAL FLUID. Nephrectomy   Final   Special Requests Normal  Final   Gram Stain   Final    ABUNDANT WBC  PRESENT,BOTH PMN AND MONONUCLEAR NO SQUAMOUS EPITHELIAL CELLS SEEN NO ORGANISMS SEEN Performed at [facility]    Culture   Final    NO ANAEROBES ISOLATED Performed at [facility]    Report Status 12/02/2014 FINAL  Final  Fungus culture w smear     Status: None (Preliminary result)   Collection Time: 11/27/14 12:22 PM  Result Value Ref Range Status   Specimen Description ABSCESS LEFT RETROPERITONEAL FLUID/Nephrectomy  Final   Special Requests Normal  Final   Fungal Smear   Final    NO YEAST OR FUNGAL ELEMENTS SEEN Performed at [facility]    Culture   Final    CULTURE IN PROGRESS FOR FOUR WEEKS Performed at [facility]    Report Status PENDING  Incomplete  Surgical pcr screen     Status: None   Collection Time: 11/29/14  6:02 AM  Result Value Ref Range Status   MRSA, PCR NEGATIVE NEGATIVE Final   Staphylococcus aureus NEGATIVE NEGATIVE Final    Comment:        The Xpert SA Assay (FDA approved for NASAL specimens in patients over 61 years of age), is one component of a comprehensive surveillance program.  Test performance has been validated by [facility] for patients greater than or equal to 92 year old. It is not intended to diagnose infection nor to guide or monitor treatment.   Culture, routine-abscess     Status: None   Collection Time: 11/29/14 10:23 AM  Result Value Ref  Range Status   Specimen Description ABSCESS BACK  Final   Special Requests NONE  Final   Gram Stain   Final    RARE WBC PRESENT, PREDOMINANTLY PMN NO SQUAMOUS EPITHELIAL CELLS SEEN NO ORGANISMS SEEN Performed at [facility]    Culture   Final    NO GROWTH 3 DAYS Performed at [facility]    Report Status 12/02/2014 FINAL  Final  Anaerobic culture     Status: None   Collection Time: 11/29/14 10:25 AM  Result Value Ref Range Status   Specimen Description ABSCESS BACK  Final   Special Requests NONE  Final   Gram Stain   Final    RARE  WBC PRESENT, PREDOMINANTLY PMN NO SQUAMOUS EPITHELIAL CELLS SEEN NO ORGANISMS SEEN Performed at [facility]    Culture   Final    NO ANAEROBES ISOLATED Performed at [facility]    Report Status 12/04/2014 FINAL  Final  Body fluid culture     Status: None   Collection Time: 11/29/14 11:42 AM  Result Value Ref Range Status   Specimen Description FLUID PERITONEAL LEFT PERITONEAL FLUID  Final   Special Requests NONE  Final   Gram Stain   Final    FEW WBC PRESENT, PREDOMINANTLY PMN NO ORGANISMS SEEN    Culture   Final    NO GROWTH 3 DAYS Performed at [facility]    Report Status 12/02/2014 FINAL  Final  Anaerobic culture     Status: None   Collection Time: 11/29/14 11:42 AM  Result Value Ref Range Status   Specimen Description FLUID PERITONEAL  Final   Special Requests NONE  Final   Gram Stain   Final    FEW WBC PRESENT, PREDOMINANTLY PMN NO ORGANISMS SEEN    Culture   Final    NO ANAEROBES ISOLATED Performed at [facility]    Report Status 12/04/2014 FINAL  Final    Medical History: Past Medical History  Diagnosis Date  . Hypertension   . Anginal pain     hx of CP saw Dr Wynonia Lawman 09/20/2013 H&P per chart   . Left Retroperitoneal mass s/p resection/L nephrectomy 07/13/2014   . Postoperative anemia due to acute blood loss 07/15/2014  . SBO (small bowel obstruction) 08/01/2014, 10/11/14  . Retroperitoneal fluid collection   . 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 Impaired fasting glucose 11/25/2014  . Benign neoplasm of colon 11/25/2014  . Actinic keratosis 11/25/2014  . 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 He underwent repeat aspiration of  fluid on 8/29. Abx started for VRE abscess.    Significant events:   Purulence noted from abdominal wound on 9/11. Merrem added to broaden coverage.  9/11 CXR: increased BL upper lobe opacities, concerning for edema or PNA  Today, 12/11/2014:  afeb  wbc wnl  scr stable (crcl~61)  PTA Linezolid, Metronidazole since 11/15/14 - changed from zyvox to dapto d/t suspicion for zyvox induced myelosuppression 8/17 >> Linezolid >> 9/4 8/17 >> Flagyl >> 9/4 >> Daptomycin Rx>>  9/11 >> merrem Rx>>  - 9/04: CK 20 - 9/12: CK 13  8/12 L retroperitoneal fluid abscess: VRE (S = Linezolid) 8/29 abd  fluid abscess: NGF 8/29 anaerobic abd fluid abscess: NGF 8/29 fungus abd fluid abscess: no yeast or fungal elements seen, cx in progress x 4 wks 8/31 peritoneal fluid: NGF 8/31 anaerobic peritoneal fluid: neg FINAL 8/31 back abscess: NGF 8/31 anaerobic back abscess: neg FINAL 8/31 MRSA PCR: negative  Plan:  - continue Merrem 1gm IV q8h - continue Daptomycin dosing of 8 mg/kg q24h - f/u cultures, renal fxn, weekly CK (due on 9/19)  Agusta Hackenberg P 12/11/2014,7:29 AM

## 2014-12-11 NOTE — Progress Notes (Signed)
Physical Therapy Treatment Patient Details Name: Darryl Griffith MRN: [id number] DOB: 01-Nov-1945 Today's Date: 12/11/2014    History of Present Illness Darryl Griffith is a 69 y.o. male with a history of Left Flank Area Liposarcoma S/P Resection and Left Nephrectomy 06/2014 with Radiation Rx completed on 93/5701 complicated by abscess formation (+VRE) who presents to the ED 11/24/14  with complaints of ABD bloating x 2 days and nausea and vomiting x 4 today. He denies any hematemesis, fevers or chills. A Ct scan was performed 1 day ago for cancer staging and revealed a high grade partial small bowl obstruction.  on 11/29/14 Incision and drainage left back abscess, exploratory laparotomy, lysis of adhesions, small bowel resection, placement of drain into left retroperitoneal abscess     PT Comments    Pt required MAX encouragement and positive reinforcement.  Started talking about his past and engaging him with small talk whatever while explaining PT session and what our plan was for today.  With increased time, pt agreed to "try".  Demonstrated and instructed him how to "log Roll" from supine to sit EOB while minimizing his ABD pain and stress.  Pt sat EOB x 4 min with mild c/o dizziness which decreased with time.  Instructed on EVA walker and ensured him the recliner would be behind him as he walked in the hallway.  Pt agreed to "try". Pt tolerated amb 75 feet when he felt the urge for immediate BM.  Quickly returned to room and assisted to [facility].  Then assisted off BSC to recliner and positioned to comfort.  Encouraged pt to try and sit in recliner at least one hour and instructed to call nurse when he wants to lay back down.     Follow Up Recommendations  SNF;Home health PT (pending progress)     Equipment Recommendations  Rolling walker with 5" wheels    Recommendations for Other Services       Precautions / Restrictions Precautions Precautions: Fall Precaution Comments: monitor HR, NG  tube, L abd JP Restrictions Weight Bearing Restrictions: No    Mobility  Bed Mobility Overal bed mobility: Needs Assistance Bed Mobility: Rolling;Sidelying to Sit Rolling: Mod assist;Min assist;+2 for safety/equipment Sidelying to sit: Mod assist;+2 for safety/equipment       General bed mobility comments: instructed on "log Roll" tech to decrease ABD stress/pain.    Transfers Overall transfer level: Needs assistance Equipment used: Rolling walker (2 wheeled) Transfers: Sit to/from Stand Sit to Stand: Min assist;+2 physical assistance;+2 safety/equipment         General transfer comment: assisted OOB to amb then assisted from recliner to [facility] for BM with 25% VC's on safety with turns   Ambulation/Gait Ambulation/Gait assistance: +2 safety/equipment;Mod assist Ambulation Distance (Feet): 75 Feet Assistive device: Bilateral platform walker (EVA walker) Gait Pattern/deviations: Step-to pattern;Step-through pattern;Trunk flexed Gait velocity: decreased   General Gait Details: used B platform EVA walker due to extended LOS and overall weakness due to medical.  Amb on 3 lts sats avg 98% and HR 96.  Recliner chair following for safety.     Stairs            Wheelchair Mobility    Modified Rankin (Stroke Patients Only)       Balance                                    Cognition Arousal/Alertness: Awake/alert Behavior During Therapy: [facility]  for tasks assessed/performed (more conversive) Overall Cognitive Status: Within Functional Limits for tasks assessed                      Exercises      General Comments        Pertinent Vitals/Pain Pain Assessment: 0-10 Pain Score: 4  Pain Location: ABD/sacral Pain Descriptors / Indicators: Constant Pain Intervention(s): Monitored during session;Patient requesting pain meds-RN notified;Repositioned    Home Living                      Prior Function            PT Goals (current goals  can now be found in the care plan section) Progress towards PT goals: Progressing toward goals    Frequency  Min 3X/week    PT Plan Current plan remains appropriate    Co-evaluation             End of Session Equipment Utilized During Treatment: Gait belt Activity Tolerance: Patient limited by fatigue;Patient limited by pain;Treatment limited secondary to medical complications (Comment) Patient left: in chair;with call bell/phone within reach     Time: 1530-1610 PT Time Calculation (min) (ACUTE ONLY): 40 min  Charges:  $Gait Training: 8-22 mins $Therapeutic Activity: 23-37 mins                    G Codes:      Rica Koyanagi  PTA [facility]  Acute  Rehab Pager      [phone]

## 2014-12-12 DIAGNOSIS — F332 Major depressive disorder, recurrent severe without psychotic features: Secondary | ICD-10-CM

## 2014-12-12 DIAGNOSIS — R197 Diarrhea, unspecified: Secondary | ICD-10-CM

## 2014-12-12 LAB — GLUCOSE, CAPILLARY
GLUCOSE-CAPILLARY: 131 mg/dL — AB (ref 65–99)
Glucose-Capillary: 163 mg/dL — ABNORMAL HIGH (ref 65–99)
Glucose-Capillary: 158 mg/dL — ABNORMAL HIGH (ref 65–99)

## 2014-12-12 MED ORDER — SERTRALINE HCL 25 MG PO TABS
25.0000 mg | ORAL_TABLET | Freq: Every day | ORAL | Status: DC
  Administered 2014-12-12 – 2014-12-17 (×6): 25 mg via ORAL
  Filled 2014-12-12 (×6): qty 1

## 2014-12-12 MED ORDER — TRACE MINERALS CR-CU-MN-SE-ZN 10-1000-500-60 MCG/ML IV SOLN
INTRAVENOUS | Status: AC
  Administered 2014-12-12: 18:00:00 via INTRAVENOUS
  Filled 2014-12-12: qty 1992

## 2014-12-12 MED ORDER — FAT EMULSION 20 % IV EMUL
240.0000 mL | INTRAVENOUS | Status: AC
  Administered 2014-12-12: 240 mL via INTRAVENOUS
  Filled 2014-12-12: qty 250

## 2014-12-12 MED ORDER — INSULIN ASPART 100 UNIT/ML ~~LOC~~ SOLN
0.0000 [IU] | Freq: Four times a day (QID) | SUBCUTANEOUS | Status: DC
  Administered 2014-12-12 – 2014-12-13 (×3): 2 [IU] via SUBCUTANEOUS
  Administered 2014-12-13: 3 [IU] via SUBCUTANEOUS
  Administered 2014-12-14 – 2014-12-15 (×5): 2 [IU] via SUBCUTANEOUS

## 2014-12-12 NOTE — Consult Note (Signed)
[facility] Consult Follow Up  Reason for Consult:  Depression and refusing medical treatment Referring Physician:  Dr. Rockne Menghini Patient Identification: Darryl Griffith MRN:  [id number] Principal Diagnosis: SBO (small bowel obstruction) Diagnosis:   Patient Active Problem List   Diagnosis Date Noted  . Pressure ulcer [L89.90] 12/11/2014  . Acute pulmonary edema [J81.0] 12/11/2014  . Fluid collection at surgical site [T88.8XXA]   . Small bowel obstruction [K56.69]   . Hypokalemia [E87.6] 11/25/2014  . Spinal stenosis [M48.00] 11/25/2014  . Essential (primary) hypertension [I10] 11/25/2014  . Mixed hyperlipidemia [E78.2] 11/25/2014  . Malnutrition of moderate degree [E44.0] 11/08/2014  . 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 Abscess, retroperitoneal [K68.19]   . Melena [K92.1] 09/18/2014  . Hyponatremia [E87.1] 09/18/2014  . Hyperglycemia [R73.9] 09/18/2014  . Hypoalbuminemia [E88.09] 09/18/2014  . Anemia of chronic disease [D63.8] 09/18/2014  . Hypertension [I10]   . 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 male patient admitted with depression secondary to medical problems and complications especially small bowel obstruction.  HPI:  Darryl Griffith is an 69 y.o. male seen face-to-face for psychiatric consultation along with psychiatric social service and also reviewed chart. Psychiatric consultation was requested secondary to increased symptoms of depression, isolation, withdrawn, lack of energy and motivation along with poor compliance with treatment. Patient reported he felt that way yesterday but today he is feeling little better and has been compliant with the medication management and treatment. Patient endorses feeling down because of ongoing complications of his medical  problem and recurrent small bowel obstruction. Patient stated he needed 3 people to go to bathroom which is frustrating to him. Patient appeared with depressed mood and restricted affect. Patient has a low-volume of speech but pleasant during my evaluation. Spoke with the patient wife who was at bedside and seems to be very supportive to him. Patient has denied suicidal/homicidal ideation, intention or plans. Patient has no previous history of psychiatric inpatient hospitalization or outpatient treatment. Patient stated he is able to take liquid diet at this time.   Interval History: Patient seen with psych social service today. Patient appeared sitting in a chair next to his bed. Patient reported poor sleep and is not feeling well today. Patient is trying his best to drink and eat soft diet. Patient reports incontinence and that he is disappointed that he has to rely on others. Patient received first dose of zoloft today afternoon and tolerated.  Past Medical History:  Past Medical History  Diagnosis Date  . Hypertension   . Anginal pain     hx of CP saw Dr Wynonia Lawman 09/20/2013 H&P per chart   . Left Retroperitoneal mass s/p resection/L nephrectomy 07/13/2014   . Postoperative anemia due to acute blood loss 07/15/2014  . SBO (small bowel obstruction) 08/01/2014, 10/11/14  . Retroperitoneal fluid collection   . 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 Impaired fasting glucose 11/25/2014  . Benign neoplasm of colon 11/25/2014  . Actinic keratosis 11/25/2014  . Mixed hyperlipidemia 11/25/2014    Past Surgical History  Procedure Laterality Date  . Surgery for arm fracture Right 1952  . Knee arthroscopy Right 1970's  . Colonoscopy with propofol N/A 10/20/2013    Procedure: COLONOSCOPY WITH PROPOFOL;  Surgeon: Evette Doffing  Aloha Gell, MD;  Location: [facility];  Service: Endoscopy;  Laterality: N/A;  . Hot hemostasis N/A 10/20/2013     Procedure: HOT HEMOSTASIS (ARGON PLASMA COAGULATION/BICAP);  Surgeon: Lear Ng, MD;  Location: [facility];  Service: Endoscopy;  Laterality: N/A;  . Tigger finger release      per H&P per Dr Wynonia Lawman 09/20/2013 (on chart)  . Resection of retroperitoneal mass Left 07/13/2014    Procedure: RESECTION OF left RETROPERITONEAL MASS;  Surgeon: Stark Klein, MD;  Location: [facility];  Service: General;  Laterality: Left;  . Esophagogastroduodenoscopy (egd) with propofol N/A 09/19/2014    Procedure: ESOPHAGOGASTRODUODENOSCOPY (EGD) WITH PROPOFOL;  Surgeon: Wilford Corner, MD;  Location: [facility];  Service: Endoscopy;  Laterality: N/A;  . Laparotomy N/A 11/29/2014    Procedure: EXPLORATORY LAPAROTOMY SMALL BOWEL RESECTION;  Surgeon: Stark Klein, MD;  Location: [facility];  Service: General;  Laterality: N/A;  . Irrigation and debridement abscess N/A 11/29/2014    Procedure: IRRIGATION AND DEBRIDEMENT BACK ABSCESS;  Surgeon: Stark Klein, MD;  Location: [facility];  Service: General;  Laterality: N/A;   Family History:  Family History  Problem Relation Age of Onset  . Adopted: Yes  . Family history unknown: Yes   Social History:  History  Alcohol Use No     History  Drug Use No    Social History   Social History  . Marital Status: Married    Spouse Name: [name]  . Number of Children: 4  . Years of Education: N/A   Occupational History  . Retired Armed forces operational officer    Social History Main Topics  . Smoking status: Never Smoker   . Smokeless tobacco: Never Used  . Alcohol Use: No  . Drug Use: No  . Sexual Activity: Not Currently   Other Topics Concern  . None   Social History Narrative   Married.  Lives in [place] with wife.   Additional Social History:                          Allergies:   Allergies  Allergen Reactions  . Compazine [Prochlorperazine] Other (See Comments)    Made pt really lethargic, and sleepy    Labs:  Results for orders placed or  performed during the hospital encounter of 11/24/14 (from the past 48 hour(s))  Brain natriuretic peptide     Status: Abnormal   Collection Time: 12/10/14  5:01 PM  Result Value Ref Range   B Natriuretic Peptide 103.3 (H) 0.0 - 100.0 pg/mL  Basic metabolic panel     Status: Abnormal   Collection Time: 12/10/14  5:01 PM  Result Value Ref Range   Sodium 130 (L) 135 - 145 mmol/L   Potassium 5.1 3.5 - 5.1 mmol/L   Chloride 105 101 - 111 mmol/L   CO2 20 (L) 22 - 32 mmol/L   Glucose, Bld 154 (H) 65 - 99 mg/dL   BUN 30 (H) 6 - 20 mg/dL   Creatinine, Ser 0.99 0.61 - 1.24 mg/dL   Calcium 7.7 (L) 8.9 - 10.3 mg/dL   GFR calc non Af Amer >60 >60 mL/min   GFR calc Af Amer >60 >60 mL/min    Comment: (NOTE) The eGFR has been calculated using the CKD EPI equation. This calculation has not been validated in all clinical situations. eGFR's persistently <60 mL/min signify possible Chronic Kidney Disease.    Anion gap 5 5 - 15  CBC  Status: Abnormal   Collection Time: 12/10/14  5:01 PM  Result Value Ref Range   WBC 7.1 4.0 - 10.5 K/uL   RBC 2.98 (L) 4.22 - 5.81 MIL/uL   Hemoglobin 8.3 (L) 13.0 - 17.0 g/dL   HCT 25.3 (L) 39.0 - 52.0 %   MCV 84.9 78.0 - 100.0 fL   MCH 27.9 26.0 - 34.0 pg   MCHC 32.8 30.0 - 36.0 g/dL   RDW 20.0 (H) 11.5 - 15.5 %   Platelets 558 (H) 150 - 400 K/uL  Glucose, capillary     Status: Abnormal   Collection Time: 12/10/14  6:36 PM  Result Value Ref Range   Glucose-Capillary 157 (H) 65 - 99 mg/dL   Comment 1 Notify RN   Glucose, capillary     Status: Abnormal   Collection Time: 12/11/14 12:18 AM  Result Value Ref Range   Glucose-Capillary 160 (H) 65 - 99 mg/dL  Comprehensive metabolic panel     Status: Abnormal   Collection Time: 12/11/14  6:10 AM  Result Value Ref Range   Sodium 130 (L) 135 - 145 mmol/L   Potassium 4.8 3.5 - 5.1 mmol/L   Chloride 101 101 - 111 mmol/L   CO2 19 (L) 22 - 32 mmol/L   Glucose, Bld 148 (H) 65 - 99 mg/dL   BUN 32 (H) 6 - 20 mg/dL    Creatinine, Ser 1.09 0.61 - 1.24 mg/dL   Calcium 8.2 (L) 8.9 - 10.3 mg/dL   Total Protein 5.9 (L) 6.5 - 8.1 g/dL   Albumin 2.5 (L) 3.5 - 5.0 g/dL   AST 17 15 - 41 U/L   ALT 9 (L) 17 - 63 U/L   Alkaline Phosphatase 111 38 - 126 U/L   Total Bilirubin 0.4 0.3 - 1.2 mg/dL   GFR calc non Af Amer >60 >60 mL/min   GFR calc Af Amer >60 >60 mL/min    Comment: (NOTE) The eGFR has been calculated using the CKD EPI equation. This calculation has not been validated in all clinical situations. eGFR's persistently <60 mL/min signify possible Chronic Kidney Disease.    Anion gap 10 5 - 15  Magnesium     Status: None   Collection Time: 12/11/14  6:10 AM  Result Value Ref Range   Magnesium 1.9 1.7 - 2.4 mg/dL  Phosphorus     Status: None   Collection Time: 12/11/14  6:10 AM  Result Value Ref Range   Phosphorus 3.9 2.5 - 4.6 mg/dL  CBC     Status: Abnormal   Collection Time: 12/11/14  6:10 AM  Result Value Ref Range   WBC 8.3 4.0 - 10.5 K/uL   RBC 3.25 (L) 4.22 - 5.81 MIL/uL   Hemoglobin 9.0 (L) 13.0 - 17.0 g/dL   HCT 27.4 (L) 39.0 - 52.0 %   MCV 84.3 78.0 - 100.0 fL   MCH 27.7 26.0 - 34.0 pg   MCHC 32.8 30.0 - 36.0 g/dL   RDW 20.0 (H) 11.5 - 15.5 %   Platelets 638 (H) 150 - 400 K/uL  Differential     Status: Abnormal   Collection Time: 12/11/14  6:10 AM  Result Value Ref Range   Neutrophils Relative % 76 43 - 77 %   Lymphocytes Relative 10 (L) 12 - 46 %   Monocytes Relative 11 3 - 12 %   Eosinophils Relative 2 0 - 5 %   Basophils Relative 1 0 - 1 %   Neutro Abs 6.3  1.7 - 7.7 K/uL   Lymphs Abs 0.8 0.7 - 4.0 K/uL   Monocytes Absolute 0.9 0.1 - 1.0 K/uL   Eosinophils Absolute 0.2 0.0 - 0.7 K/uL   Basophils Absolute 0.1 0.0 - 0.1 K/uL   RBC Morphology TARGET CELLS    WBC Morphology MILD LEFT SHIFT (1-5% METAS, OCC MYELO, OCC BANDS)   Triglycerides     Status: None   Collection Time: 12/11/14  6:10 AM  Result Value Ref Range   Triglycerides 123 <150 mg/dL    Comment: Performed at  [facility]  Prealbumin     Status: Abnormal   Collection Time: 12/11/14  6:10 AM  Result Value Ref Range   Prealbumin 6.0 (L) 18 - 38 mg/dL    Comment: Performed at [facility]  CK     Status: Abnormal   Collection Time: 12/11/14  6:10 AM  Result Value Ref Range   Total CK 13 (L) 49 - 397 U/L  Glucose, capillary     Status: Abnormal   Collection Time: 12/11/14  6:58 AM  Result Value Ref Range   Glucose-Capillary 137 (H) 65 - 99 mg/dL  Glucose, capillary     Status: Abnormal   Collection Time: 12/11/14 12:05 PM  Result Value Ref Range   Glucose-Capillary 175 (H) 65 - 99 mg/dL  Glucose, capillary     Status: Abnormal   Collection Time: 12/11/14  7:09 PM  Result Value Ref Range   Glucose-Capillary 152 (H) 65 - 99 mg/dL  Glucose, capillary     Status: Abnormal   Collection Time: 12/11/14 11:32 PM  Result Value Ref Range   Glucose-Capillary 137 (H) 65 - 99 mg/dL  Glucose, capillary     Status: Abnormal   Collection Time: 12/12/14  6:30 AM  Result Value Ref Range   Glucose-Capillary 163 (H) 65 - 99 mg/dL  Glucose, capillary     Status: Abnormal   Collection Time: 12/12/14 12:54 PM  Result Value Ref Range   Glucose-Capillary 158 (H) 65 - 99 mg/dL    Vitals: Blood pressure 151/74, pulse 101, temperature 97.8 F (36.6 C), temperature source Oral, resp. rate 40, height 5' 8"  (1.727 m), weight 73.936 kg (163 lb), SpO2 100 %.  Risk to Self: Is patient at risk for suicide?: No Risk to Others:   Prior Inpatient Therapy:   Prior Outpatient Therapy:    Current Facility-Administered Medications  Medication Dose Route Frequency Provider Last Rate Last Dose  . Marland KitchenTPN (CLINIMIX-E) Adult   Intravenous Continuous TPN Thomes Lolling, [facility]       And  . fat emulsion 20 % infusion 240 mL  240 mL Intravenous Continuous TPN Baltazar Najjar Lilliston, RPH      . 0.9 %  sodium chloride infusion   Intravenous Continuous Lynelle Doctor, RPH   Stopped at 12/10/14 1900  . acetaminophen  (TYLENOL) solution 650 mg  650 mg Oral Q4H PRN Rhetta Mura Schorr, NP      . acetaminophen (TYLENOL) suppository 650 mg  650 mg Rectal Q6H PRN Theressa Millard, MD      . acetaminophen (TYLENOL) tablet 650 mg  650 mg Oral Q6H Stark Klein, MD   650 mg at 12/12/14 1400  . alum & mag hydroxide-simeth (MAALOX/MYLANTA) 200-200-20 MG/5ML suspension 30 mL  30 mL Oral Q6H PRN Michael Boston, MD      . baclofen (LIORESAL) tablet 10 mg  10 mg Oral TID PRN Stark Klein, MD      .  bisacodyl (DULCOLAX) suppository 10 mg  10 mg Rectal Daily Michael Boston, MD   10 mg at 12/11/14 1000  . clonazepam (KLONOPIN) disintegrating tablet 0.5 mg  0.5 mg Oral BID PRN Venetia Maxon Rama, MD   0.5 mg at 12/10/14 1439  . DAPTOmycin (CUBICIN) 600 mg in sodium chloride 0.9 % IVPB  600 mg Intravenous Q24H Hosie Poisson, MD   600 mg at 12/11/14 1300  . TPN (CLINIMIX-E) Adult   Intravenous Continuous TPN Lynelle Doctor, RPH 93 mL/hr at 12/11/14 1834     And  . fat emulsion 20 % infusion 240 mL  240 mL Intravenous Continuous TPN Anh P Pham, RPH 10 mL/hr at 12/11/14 1835 240 mL at 12/11/14 1835  . heparin injection 5,000 Units  5,000 Units Subcutaneous 3 times per day Alphonsa Overall, MD   5,000 Units at 12/12/14 1452  . hydrALAZINE (APRESOLINE) injection 5 mg  5 mg Intravenous Q6H PRN Hosie Poisson, MD      . insulin aspart (novoLOG) injection 0-15 Units  0-15 Units Subcutaneous 4 times per day Thomes Lolling, RPH   0 Units at 12/12/14 1453  . lip balm (CARMEX) ointment 1 application  1 application Topical BID Michael Boston, MD   1 application at 62/83/66 2200  . LORazepam (ATIVAN) injection 0.5 mg  0.5 mg Intravenous QHS PRN Caren Griffins, MD   0.5 mg at 12/11/14 2251  . magic mouthwash  15 mL Oral QID PRN Michael Boston, MD      . megestrol (MEGACE) 400 MG/10ML suspension 400 mg  400 mg Oral Daily Stark Klein, MD   400 mg at 12/12/14 1046  . menthol-cetylpyridinium (CEPACOL) lozenge 3 mg  1 lozenge Oral PRN Michael Boston, MD      .  meropenem (MERREM) 1 g in sodium chloride 0.9 % 100 mL IVPB  1 g Intravenous Q8H Anh P Pham, RPH   1 g at 12/12/14 1046  . metoprolol (LOPRESSOR) injection 5 mg  5 mg Intravenous Q6H PRN Michael Boston, MD   5 mg at 12/07/14 0616  . ondansetron (ZOFRAN) tablet 4 mg  4 mg Oral Q6H PRN Theressa Millard, MD      . oxyCODONE (Oxy IR/ROXICODONE) immediate release tablet 5-10 mg  5-10 mg Oral Q4H PRN Stark Klein, MD   10 mg at 12/12/14 1452  . pantoprazole (PROTONIX) injection 40 mg  40 mg Intravenous Q12H Stark Klein, MD   40 mg at 12/12/14 1046  . phenol (CHLORASEPTIC) mouth spray 1 spray  1 spray Mouth/Throat PRN Caren Griffins, MD   1 spray at 11/25/14 1154  . promethazine (PHENERGAN) injection 6.25-12.5 mg  6.25-12.5 mg Intravenous Q4H PRN Michael Boston, MD      . sertraline (ZOLOFT) tablet 25 mg  25 mg Oral Daily Ambrose Finland, MD   25 mg at 12/12/14 1452  . sodium chloride 0.9 % injection 10-40 mL  10-40 mL Intracatheter PRN Alphonsa Overall, MD   10 mL at 12/07/14 0512  . traMADol (ULTRAM) tablet 50-100 mg  50-100 mg Oral Q6H PRN Stark Klein, MD        Musculoskeletal: Strength & Muscle Tone: decreased Gait & Station: unable to stand Patient leans: N/A  Psychiatric Specialty Exam: Physical Exam as per history and physical   ROS complains about generalized weakness, lack of energy, lack of motivation and interest, also reported depression with frustration patient denies nausea or vomiting shortness of breath or chest pain  Blood pressure 151/74,  pulse 101, temperature 97.8 F (36.6 C), temperature source Oral, resp. rate 40, height 5' 8"  (1.727 m), weight 73.936 kg (163 lb), SpO2 100 %.Body mass index is 24.79 kg/(m^2).  General Appearance: Guarded  Eye Contact::  Good  Speech:  Clear and Coherent and Slow  Volume:  Decreased  Mood:  Depressed  Affect:  Constricted and Depressed  Thought Process:  Coherent and Goal Directed  Orientation:  Full (Time, Place, and Person)   Thought Content:  WDL  Suicidal Thoughts:  No  Homicidal Thoughts:  No  Memory:  Immediate;   Fair Recent;   Fair  Judgement:  Intact  Insight:  Fair  Psychomotor Activity:  Decreased  Concentration:  Fair  Recall:  [employer] of Knowledge:Good  Language: Good  Akathisia:  Negative  Handed:  Right  AIMS (if indicated):     Assets:  Communication Skills Desire for Improvement Financial Resources/Insurance Housing Intimacy Leisure Time Resilience Social Support Transportation  ADL's:  Impaired  Cognition: Impaired,  Mild  Sleep:      Medical Decision Making: Review of Psycho-Social Stressors (1), Review or order clinical lab tests (1), New Problem, with no additional work-up planned (3), Review of Last Therapy Session (1), Review or order medicine tests (1), Review of Medication Regimen & Side Effects (2) and Review of New Medication or Change in Dosage (2)  Treatment Plan Summary: Daily contact with patient to assess and evaluate symptoms and progress in treatment and Medication management  Plan:  Continue Zoloft 25 mg daily for depression which can be increased up to 200 mg has patient tolerated and benefits clinically Patient encouraged to be compliant with the treatment while in the hospital Patient does not meet criteria for psychiatric inpatient admission. Supportive therapy provided about ongoing stressors.  Appreciate psychiatric consultation Please contact [phone] or [phone] if needs further assistance   Disposition: Patient benefit from medication management and may needed skilled nursing facility or palliative care  Hasel Janish,JANARDHAHA R. 12/12/2014 4:41 PM

## 2014-12-12 NOTE — Progress Notes (Signed)
Physical Therapy Treatment Patient Details Name: Quayshawn Nin MRN: [id number] DOB: [birth year] Today's Date: 12/12/2014    History of Present Illness Trestin Vences is a 69 y.o. male with a history of Left Flank Area Liposarcoma S/P Resection and Left Nephrectomy 06/2014 with Radiation Rx completed on 72/5366 complicated by abscess formation (+VRE) who presents to the ED 11/24/14  with complaints of ABD bloating x 2 days and nausea and vomiting x 4 today. He denies any hematemesis, fevers or chills. A Ct scan was performed 1 day ago for cancer staging and revealed a high grade partial small bowl obstruction.  on 11/29/14 Incision and drainage left back abscess, exploratory laparotomy, lysis of adhesions, small bowel resection, placement of drain into left retroperitoneal abscess     PT Comments    Spouse present during session.  Pt more eager and agreeable this time.  Assisted OOB vis "Log Roll" to minimize ABD stress/pain.  Amb twice the distance still using EVA walker for increased support.  Pt was able to amb the full length of the hallway, turn around at the window and walk back all the to his room.  Positioned in recliner.  Pt progressing well.   Follow Up Recommendations  SNF;Home health PT (pending progress but pt and spouse hopeful for D/C to home. )     Equipment Recommendations  Rolling walker with 5" wheels    Recommendations for Other Services       Precautions / Restrictions Precautions Precautions: Fall Precaution Comments: monitor HR, L abd JP, O2 Restrictions Weight Bearing Restrictions: No    Mobility  Bed Mobility Overal bed mobility: Needs Assistance Bed Mobility: Rolling;Sidelying to Sit Rolling: Min assist Sidelying to sit: Min assist;Mod assist       General bed mobility comments: instructed on "log Roll" tech to decrease ABD stress/pain.  better able to self perform.  Transfers Overall transfer level: Needs assistance Equipment used: None Transfers:  Sit to/from Stand Sit to Stand: Min assist;+2 physical assistance;+2 safety/equipment         General transfer comment: assisted from elevated bed + 2 assist for safety.  Pt more abkle to rise and control decend using b hands on chair.   Ambulation/Gait Ambulation/Gait assistance: Min assist;+2 safety/equipment Ambulation Distance (Feet): 150 Feet Assistive device: Bilateral platform walker (EVA walker) Gait Pattern/deviations: Step-to pattern;Step-through pattern;Decreased step length - right;Decreased step length - left;Decreased stride length;Trunk flexed Gait velocity: decreased   General Gait Details: used B platform EVA walker due to extended LOS and overall weakness due to medical.  Recliner chair following for safety.  Pt tolerated increased distance and upright time.    Stairs            Wheelchair Mobility    Modified Rankin (Stroke Patients Only)       Balance                                    Cognition Arousal/Alertness: Awake/alert Behavior During Therapy: WFL for tasks assessed/performed Overall Cognitive Status: Within Functional Limits for tasks assessed                      Exercises      General Comments        Pertinent Vitals/Pain Pain Assessment: 0-10 Pain Score: 3  Pain Location: ABD area Pain Descriptors / Indicators: Constant;Sore;Tightness Pain Intervention(s): Monitored during session;Repositioned    Home Living  Prior Function            PT Goals (current goals can now be found in the care plan section) Progress towards PT goals: Progressing toward goals    Frequency  Min 3X/week    PT Plan Current plan remains appropriate    Co-evaluation             End of Session Equipment Utilized During Treatment: Gait belt Activity Tolerance: Patient tolerated treatment well Patient left: in chair;with call bell/phone within reach;with family/visitor present      Time: 1505-1530 PT Time Calculation (min) (ACUTE ONLY): 25 min  Charges:  $Gait Training: 8-22 mins $Therapeutic Activity: 8-22 mins                    G Codes:      Rica Koyanagi  PTA [facility]  Acute  Rehab Pager      [phone]

## 2014-12-12 NOTE — Progress Notes (Signed)
PARENTERAL NUTRITION CONSULT NOTE - Follow-Up  Pharmacy Consult for TPN Indication: SBO  Allergies  Allergen Reactions  . Compazine [Prochlorperazine] Other (See Comments)    Made pt really lethargic, and sleepy   Patient Measurements: Height: 5' 8" (172.7 cm) Weight: 163 lb (73.936 kg) IBW/kg (Calculated) : 68.4  Vital Signs: Temp: 97.8 F (36.6 C) (09/13 0551) Temp Source: Oral (09/13 0551) BP: 151/74 mmHg (09/13 0551) Pulse Rate: 101 (09/13 0551) Intake/Output from previous day: 09/12 0701 - 09/13 0700 In: 9415.8 [IV Piggyback:412; TPN:9003.8] Out: 2350 [Urine:2350] Intake/Output from this shift:    Labs:  Recent Labs  12/10/14 1701 12/11/14 0610  WBC 7.1 8.3  HGB 8.3* 9.0*  HCT 25.3* 27.4*  PLT 558* 638*    Recent Labs  12/10/14 1701 12/11/14 0610  NA 130* 130*  K 5.1 4.8  CL 105 101  CO2 20* 19*  GLUCOSE 154* 148*  BUN 30* 32*  CREATININE 0.99 1.09  CALCIUM 7.7* 8.2*  MG  --  1.9  PHOS  --  3.9  PROT  --  5.9*  ALBUMIN  --  2.5*  AST  --  17  ALT  --  9*  ALKPHOS  --  111  BILITOT  --  0.4  PREALBUMIN  --  6.0*  TRIG  --  123   Estimated Creatinine Clearance: 61 mL/min (by C-G formula based on Cr of 1.09).    Recent Labs  12/11/14 1909 12/11/14 2332 12/12/14 0630  GLUCAP 152* 137* 163*    Medical History: Past Medical History  Diagnosis Date  . Hypertension   . Anginal pain     hx of CP saw Dr Wynonia Lawman 09/20/2013 H&P per chart   . Left Retroperitoneal mass s/p resection/L nephrectomy 07/13/2014   . Postoperative anemia due to acute blood loss 07/15/2014  . SBO (small bowel obstruction) 08/01/2014, 10/11/14  . Retroperitoneal fluid collection   . 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 Impaired fasting glucose 11/25/2014  . Benign neoplasm of colon 11/25/2014  . Actinic keratosis 11/25/2014  . Mixed hyperlipidemia 11/25/2014    Insulin Requirements: 7  units over the past 24 hours (on sensitive SSI q6h) - no hx DM  Current Nutrition: Clear liquid diet (appetite poor; eating ~20%)  IVF: NS @ KVO  Central access: PICC placed 10/12/14 TPN start date: 8/28  ASSESSMENT                                                                                                          HPI:  27 yoM s/p resection L retroperitoneal sarcoma, L nephrectomy 07/13/14 developed VRE abscess. Hx abd bloating, N/V 2 days PTA, SBO seen on CT for Ca restaging.  Hx recurrent SBO. Begin TPN per Pharmacy  Significant events:   8/29: plan for exp laparotomy on 8/31; s/p aspiration of L retroperitoneal fluid collection on 8/29 8/31:  s/p exp lap -->  I&D L back abscess, lysis of adhesions, small bowel resection, placement og  drain into L retroperitoneal abscess 9/2: bloody NGT output, PPI IV BID added;  change clinimix E 5/15 to E 5/20 9/7: NGT clamp study- minimal residual and no n/v 9/8: Clear liquid diet ordered  9/9: NGT removed  Today:   Glucose (goal <150): cbgs 137-163--> mostly >150  Electrolytes - Na low at 130, Mag 1.9;  K, phos, CoCa WNL   Renal - WNL   LFTs - AST/ALT low, Alk Phos, Tbili WNL   TGs - 123 (9/12)  Prealbumin - 6.1 (9/5) -->6.0 (9/12)  NUTRITIONAL GOALS                                                                                             RD recs:   Kcal: 2300-2500  Protein: 110-120 grams  Fluid: 2.2-2.5 L/day  Clinimix E 5/20 at a rate of 83 ml/hr + 20% fat emulsion at 10 ml/hr to provide: 100 g/day protein, 2233 Kcal/day (>90% nutritional goals per dietician recommendations)   PLAN                                                                                                                         1) At 1800 today:  Decrease Clinimix E 5/20 to 83 ml/hr.  20% fat emulsion at 10 ml/hr.  TPN to contain standard multivitamins and trace elements.  Continue IVF at [facility].  Increase moderate SSI q6h.  TPN lab  panels on Mondays & Thursdays.  Advancing diet/ Encourage PO intake  F/u daily.  Netta Cedars, PharmD, BCPS Pager: [phone] 12/12/2014 8:30 AM

## 2014-12-12 NOTE — Progress Notes (Addendum)
Progress Note   Darryl Griffith SEG:[id number] DOB: 06/28/1945 DOA: 11/24/2014 PCP: Darryl Pac, MD   Brief Narrative:   Darryl Griffith is an 69 y.o. 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 General surgery was consulted on admission. Patient did not improve with conservative measures and underwent surgery 11/29/14: incision and drainage of left back abscess, exploratory laparotomy, lysis of adhesions, small bowel resection and placement of drain into left retroperitoneal abscess. The patient has made slow progress over the course of his hospital stay, requiring TNA for a prolonged ileus and ongoing IV antibiotics for persistent infection. At this point, the surgical and ID consultants are driving his care.  The family wishes to have him placed at a facility in [place] at discharge.  Assessment/Plan:   Principal problem:  SBO status post resection with lysis of adhesions/postoperative ileus -Underwent surgical repair on 8/31 with exploratory laparotomy, lysis of adhesions, small bowel resection, placement of drain into left retroperitoneal abscess.  - Wound opened 12/10/14.  Surgery plans to place wound VAC when wound cleaner.   - Being managed with W->D dressing changes.. - Diet advanced to [facility] 12/12/14. NG tube out.  - Remains on TNA. Will wean when diet advanced further. - Slow resolution of postoperative ileus. - PCA Dilaudid discontinued, pain controlled.  Active problems:  Acute pulmonary edema - BNP only mildly elevated at 103.3.  CXR 12/10/14 showed findings consistent with edema.   - Lasix 40 mg given.  Respiratory status improved.  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 Follow-up oncology post discharge.  Left retroperitoneal and left upper quadrant fluid collection  - Cultures initially positive for VRE. Most recent cultures from 11/29/14 were negative. - Status post IR drainage 11/27/14. - ID following. Antibiotics per ID/surgery recommendations, now on daptomycin & meropenem.  Pressure sore  - The patient needs extreme encouragement to get out of bed. Seems depressed/withdrawn/disengaged. - Deep tissue pressure injury noted to sacral area stemming from lack of mobility. - Sacral area assessed by wound care RN with application of a silicone foam dressing. - Continue therapeutic mattress and pressure redistribution per nursing.  Depression - Psychiatry consult requested. Depressed/flat affect. Has been refusing care including getting out of bed.  HTN - Continue hydralazine as needed.  Hypokalemia - Resolved.  Hypomagnesemia - Resolved.  Symptomatic Anemia - Monitoring blood counts.  - Status post 2 units of PRBCs 11/27/14, 1 unit 12/01/14, 1 unit 12/05/14 and 1 unit 12/08/14.  Hyponatremia - Monitor sodium, improved.   Neutropenia - Resolved. Felt to be related to treatment side Vioxx versus antibiotics.   Tachycardia - Multi-factorial with infection, anemia contributory.  DVT prophylaxis - SCDs.  Code Status: Full Code Family Communication: Wife updated at the bedside.  Disposition Plan: Unclear at present, remains on TNA. Patient's daughter wants him transferred to [place]. Attempted to transfer him however he was not accepted on transfer for given that this would be a lateral transfer of care. Spoke with Dr Evonnie Dawes who recommends consideration for transfer to a [place] based L-TAC, but insurance will not pay for L-TAC.  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  CONTRAST:  149mL OMNIPAQUE IOHEXOL 300 MG/ML  SOLN  COMPARISON:  11/07/2014  FINDINGS: CT CHEST FINDINGS  Thoracic inlet: No neck base or axillary masses or adenopathy. Normal thyroid.  Mediastinum and hila: Heart normal in size and configuration. Mild prominence of the ascending aorta measuring 4 cm in diameter. 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 Right PICC tip projects in the lower superior vena cava. No mediastinal or hilar masses or pathologically enlarged lymph nodes.  Lungs and pleura: No lung masses or suspicious nodules. Small left pleural effusion associated with mild dependent left lower lobe atelectasis. Small focus of reticular nodular opacity in the posterior right lower lobe is likely additional atelectasis or scarring. There is no lung consolidation or edema. No pneumothorax.  CT ABDOMEN AND PELVIS FINDINGS  There is a fluid collection that extends from the posterior medial left superior retroperitoneum, adjacent to the spleen, to the lateral margin of the superior left psoas muscle. This measures 4.9 x 2.2 cm transversely, previously 5.6 x 2.6 cm. It measures 7.6 cm an approximate superior inferior length, previously 9.2 cm. Fluid attenuation and multiple blastic and clips lie in the left upper quadrant between the posterior wall of the stomach and upper abdominal aorta. There is thickening of this portion of the stomach wall. These changes partly surround the pancreatic tail and surround the left  diaphragmatic crus. No left adrenal gland is visible. Left kidney is surgically absent.  Liver and spleen:  Unremarkable.  Gallbladder and biliary tree: Unremarkable.  Pancreas: There is some heterogeneous lower attenuation along the pancreatic tail adjacent to the left upper quadrant surgical changes and low-attenuation soft tissue. This is similar to the prior exam, likely reactive edema. No other pancreatic abnormality.  Right adrenal glands: Normal.  Right kidney, ureter, bladder: 15 mm midpole cyst. No other right renal masses, no stones and no hydronephrosis. Normal ureters. Unremarkable bladder.  Lymph nodes:  No pathologically enlarged lymph nodes.  Ascites:  Trace ascites in the pelvis.  Gastrointestinal: There is a partial small bowel obstruction. The transition point is in the left upper adjacent to the postsurgical changes and residual left retroperitoneal fluid collection. Small bowel proximal to the transition is dilated to a maximum of 5.3 cm. Small bowel distal to this is decompressed. The colon is partly decompressed. There are scattered colonic diverticula mostly along the sigmoid colon. No diverticulitis. Normal-sized appendix is visualized. No bowel wall thickening or inflammatory changes. No free air.  Abdominal wall: There is edema in the subcutaneous fat mostly along the flanks. There is midline abdominal incision. No hernias.  Vascular: Atherosclerotic changes are noted along the infrarenal abdominal aorta and branch vessels. No aneurysm.  MUSCULOSKELETAL FINDINGS  No osteoblastic or osteolytic lesions. Degenerative changes noted of the visualized spine. Bones are demineralized.  IMPRESSION: 1. Residual left retroperitoneal and left upper quadrant collection associated with postsurgical changes. The defined residual collection, along the posterior medial left retroperitoneum abutting the spleen and left psoas muscle, is smaller, now measuring 4.9  x 2.3 x 7.6 cm, previously 5.6 x 2.6 x 9.2 cm.  Left upper quadrant postsurgical and inflammatory changes include thickening of the posterior wall of the stomach, stable. There are numerous vascular clips in the left retroperitoneum and left posterior medial upper quadrant. 2. Fairly high-grade partial small bowel obstruction with the transition point in the posterior medial left upper quadrant adjacent to the residual collection and postsurgical changes. This is similar to prior study. 3. No convincing local residual retroperitoneal sarcoma. 4. No evidence of metastatic disease from the sarcoma.   Electronically Signed   By: Lajean Manes M.D.   On: 11/23/2014 14:36   Ct Abdomen Pelvis W Contrast  11/23/2014   CLINICAL DATA:  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  CONTRAST:  140mL OMNIPAQUE IOHEXOL 300 MG/ML  SOLN  COMPARISON:  11/07/2014  FINDINGS: CT CHEST FINDINGS  Thoracic inlet: No neck base or axillary masses or adenopathy. Normal thyroid.  Mediastinum and hila: Heart normal in size and configuration. Mild prominence of the ascending aorta measuring 4 cm in diameter. 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 Right PICC tip projects in the lower superior vena cava. No mediastinal or hilar masses or pathologically enlarged lymph nodes.  Lungs and pleura: No lung masses or suspicious nodules. Small left pleural effusion associated with mild dependent left lower lobe atelectasis. Small focus of reticular nodular opacity in the posterior right lower lobe is likely additional atelectasis or  scarring. There is no lung consolidation or edema. No pneumothorax.  CT ABDOMEN AND PELVIS FINDINGS  There is a fluid collection that extends from the posterior medial left superior retroperitoneum, adjacent to the spleen, to the lateral margin of the superior left psoas muscle. This measures 4.9 x 2.2 cm transversely, previously 5.6 x 2.6 cm. It measures 7.6 cm an approximate superior inferior length, previously 9.2 cm. Fluid attenuation and multiple blastic and clips lie in the left upper quadrant between the posterior wall of the stomach and upper abdominal aorta. There is thickening of this portion of the stomach wall. These changes partly surround the pancreatic tail and surround the left diaphragmatic crus. No left adrenal gland is visible. Left kidney is surgically absent.  Liver and spleen:  Unremarkable.  Gallbladder and biliary tree: Unremarkable.  Pancreas: There is some heterogeneous lower attenuation along the pancreatic tail adjacent to the left upper quadrant surgical changes and low-attenuation soft tissue. This is similar to the prior exam, likely reactive edema. No other pancreatic abnormality.  Right adrenal glands: Normal.  Right kidney, ureter, bladder: 15 mm midpole cyst. No other right renal masses, no stones and no hydronephrosis. Normal ureters. Unremarkable bladder.  Lymph nodes:  No pathologically enlarged lymph nodes.  Ascites:  Trace ascites in the pelvis.  Gastrointestinal: There is a partial small bowel obstruction. The transition point is in the left upper adjacent to the postsurgical changes and residual left retroperitoneal fluid collection. Small bowel proximal to the transition is dilated to a maximum of 5.3 cm. Small bowel distal to this is decompressed. The colon is partly decompressed. There are scattered colonic diverticula mostly along the  sigmoid colon. No diverticulitis. Normal-sized appendix is visualized. No bowel wall thickening or inflammatory changes. No free air.   Abdominal wall: There is edema in the subcutaneous fat mostly along the flanks. There is midline abdominal incision. No hernias.  Vascular: Atherosclerotic changes are noted along the infrarenal abdominal aorta and branch vessels. No aneurysm.  MUSCULOSKELETAL FINDINGS  No osteoblastic or osteolytic lesions. Degenerative changes noted of the visualized spine. Bones are demineralized.  IMPRESSION: 1. Residual left retroperitoneal and left upper quadrant collection associated with postsurgical changes. The defined residual collection, along the posterior medial left retroperitoneum abutting the spleen and left psoas muscle, is smaller, now measuring 4.9 x 2.3 x 7.6 cm, previously 5.6 x 2.6 x 9.2 cm. Left upper quadrant postsurgical and inflammatory changes include thickening of the posterior wall of the stomach, stable. There are numerous vascular clips in the left retroperitoneum and left posterior medial upper quadrant. 2. Fairly high-grade partial small bowel obstruction with the transition point in the posterior medial left upper quadrant adjacent to the residual collection and postsurgical changes. This is similar to prior study. 3. No convincing local residual retroperitoneal sarcoma. 4. No evidence of metastatic disease from the sarcoma.   Electronically Signed   By: Lajean Manes M.D.   On: 11/23/2014 14:36   Ct Aspiration  11/28/2014   CLINICAL DATA:  Recurrent left retroperitoneal fluid collection after previous resection of sarcoma. The patient now presents with a small bowel obstruction. Aspiration of the fluid collection has been requested prior to planned surgery to determine if there is any evidence of infection. This fluid collection was last aspirated on 11/10/2014 revealing vancomycin resistant Enterococcus and also previously contained a percutaneous drain for several months.  EXAM: CT GUIDED CATHETER DRAINAGE OF LEFT RETROPERITONEAL FLUID COLLECTION  ANESTHESIA/SEDATION: 1.5  Mg IV Versed; 75  mcg IV Fentanyl  Total Moderate Sedation Time: 15 minutes  PROCEDURE: The procedure risks, benefits, and alternatives were explained to the patient. Questions regarding the procedure were encouraged and answered. The patient understands and consents to the procedure. A time-out was performed prior to the procedure.  The left flank region was prepped with Betadine in a sterile fashion, and a sterile drape was applied covering the operative field. A sterile gown and sterile gloves were used for the procedure. Local anesthesia was provided with 1% Lidocaine.  Under CT guidance, a 5 [place] Yueh centesis catheter was advanced into the left retroperitoneal space. Fluid aspiration was performed through the 5 French catheter. The catheter was then removed. Additional CT was performed.  COMPLICATIONS: None  FINDINGS: Recurrent fluid collection in the medial left retroperitoneal space again identified, identical in appearance to scanning just prior to the previous aspiration procedure on 08/12. Approximately 10 mL of pink colored, turbid fluid was able to be aspirated. Material was sent for culture analysis. Postprocedural CT shows decrease in size of the fluid collection.  IMPRESSION: CT-guided aspiration of recurrent medial left retroperitoneal fluid collection yielding 10 mL of pink colored, turbid fluid. Fluid was sent for culture analysis.   Electronically Signed   By: Aletta Edouard M.D.   On: 11/28/2014 08:21   Ir Venipuncture 70yrs/older By Md  12/01/2014   CLINICAL DATA:  Anemia, difficult access, poor peripheral veins, access for blood transfusion  EXAM: ULTRASOUND GUIDANCE FOR VASCULAR ACCESS  PERIPHERAL IV START BY PHYSICIAN.  Date:  9/2/20169/05/2014 5:13 pm  Radiologist:  M. Daryll Brod, MD  Guidance:  Ultrasound  FLUOROSCOPY TIME:  None.  MEDICATIONS AND MEDICAL HISTORY:  None.  ANESTHESIA/SEDATION: None.  CONTRAST:  None.  COMPLICATIONS: None immediate  PROCEDURE: Informed consent was obtained from the  patient following explanation of the procedure, risks, benefits and alternatives. The patient understands, agrees and consents for the procedure. All questions were addressed. A time out was performed.  Maximal barrier sterile technique utilized including caps, mask, sterile gowns, sterile gloves, large sterile drape, hand hygiene, and ChloraPrep  Preliminary ultrasound performed. Left basilic vein demonstrated. Under sterile conditions and local anesthesia, a 16 gauge Angiocath was advanced into the left basilic vein under ultrasound. Position confirmed with ultrasound. Images obtained for documentation. Blood aspirated easily followed by saline flush. This was secured externally with a sterile dressing. Ready for use.  IMPRESSION: Successful ultrasound left upper extremity basilic vein peripheral IV start.   Electronically Signed   By: [name] M.D.   On: 12/01/2014 17:19   Ir US Guide Vasc Access Left  12/01/2014   CLINICAL DATA:  Anemia, difficult access, poor peripheral veins, access for blood transfusion  EXAM: ULTRASOUND GUIDANCE FOR VASCULAR ACCESS  PERIPHERAL IV START BY PHYSICIAN.  Date:  9/2/20169/05/2014 5:13 pm  Radiologist:  M. Daryll Brod, MD  Guidance:  Ultrasound  FLUOROSCOPY TIME:  None.  MEDICATIONS AND MEDICAL HISTORY: None.  ANESTHESIA/SEDATION: None.  CONTRAST:  None.  COMPLICATIONS: None immediate  PROCEDURE: Informed consent was obtained from the patient following explanation of the procedure, risks, benefits and alternatives. The patient understands, agrees and consents for the procedure. All questions were addressed. A time out was performed.  Maximal barrier sterile technique utilized including caps, mask, sterile gowns, sterile gloves, large sterile drape, hand hygiene, and ChloraPrep  Preliminary ultrasound performed. Left basilic vein demonstrated. Under sterile conditions and local anesthesia, a 16 gauge Angiocath was advanced into the left basilic vein under ultrasound. Position  confirmed with ultrasound. Images obtained for documentation. Blood aspirated easily followed by saline flush. This was secured externally with a sterile dressing. Ready for use.  IMPRESSION: Successful ultrasound left upper extremity basilic vein peripheral IV start.   Electronically Signed   By: [name] M.D.   On: 12/01/2014 17:19   Dg Chest Port 1 View  12/10/2014   CLINICAL DATA:  Shortness of breath, tachycardia.  EXAM: PORTABLE CHEST - 1 VIEW  COMPARISON:  July 13, 2014.  FINDINGS: Stable cardiomediastinal silhouette. No pneumothorax is noted. Increased bilateral upper lobe opacities are noted concerning for pneumonia or possibly edema. Increased mild left pleural effusion is noted. Right-sided PICC line is noted with distal tip overlying expected position of the SVC. Bony thorax is intact.  IMPRESSION: Increased bilateral upper lobe opacities are noted concerning for edema or possibly pneumonia. Increased mild left pleural effusion is noted.   Electronically Signed   By: Marijo Conception, M.D.   On: 12/10/2014 16:53   Dg Abd 2 Views  11/27/2014   CLINICAL DATA:  Followup small bowel obstruction  EXAM: ABDOMEN - 2 VIEW  COMPARISON:  11/26/2014  FINDINGS: The nasogastric tube is identified with tip in the stomach. The side port is below the GE junction. Dilated loops of small bowel and air-fluid levels are again noted. These measure up to 6.7 cm. Enteric contrast material is identified within the colon up to the level of the rectum.  IMPRESSION: 1. NG tube is within the stomach. 2. Persistent partial small bowel obstruction 3. Enteric contrast material is identified up the level of the rectum.   Electronically Signed   By: Queen Slough.D.  On: 11/27/2014 09:35   Dg Abd 2 Views  11/26/2014   CLINICAL DATA:  Abdominal distention with nausea and vomiting. Surgery in April. Small bowel obstruction symptoms.  EXAM: ABDOMEN - 2 VIEW  COMPARISON:  11/24/2014  FINDINGS: Upright and supine views. Upright  view demonstrates persistent small bowel air-fluid levels, increased. Small left pleural effusion. There may be a nasogastric tube looped in the esophagus, incompletely imaged. Surgical clips in the upper abdomen. A metallic object projecting over the a lower chest is presumably external to the patient.  Supine images demonstrate small bowel distension. Example loop measures 6.7 cm today, versus 6.4 cm on the prior. Contrast is identified within the colon. No pneumatosis.  IMPRESSION: Similar small bowel obstruction pattern, without free intraperitoneal air or other acute complication.  The nasogastric may be looped in the distal esophagus. Incompletely imaged. These results will be called to the ordering clinician or representative by the Radiologist Assistant, and communication documented in the PACS or zVision Dashboard.   Electronically Signed   By: Abigail Miyamoto M.D.   On: 11/26/2014 10:07   Dg Abd Acute W/chest  11/24/2014   CLINICAL DATA:  Bloating and nausea since yesterday. Vomiting today. History of bowel obstructions. History of retroperitoneal liposarcoma.  EXAM: DG ABDOMEN ACUTE W/ 1V CHEST  COMPARISON:  CT abdomen and pelvis 11/23/2014  FINDINGS: Right PICC catheter tip over the low SVC region. No pneumothorax. Linear fibrosis or atelectasis in the left lung base. No focal airspace disease or consolidation in the lungs. Normal heart size and pulmonary vascularity.  Surgical clips in the upper abdomen. Contrast material is demonstrated in the right colon. There are prominent gas-filled distended small bowel loops in the mid abdomen and multiple air-fluid levels are demonstrated on the upright view. Appearance is consistent with mechanical small bowel obstruction. No free intra-abdominal air. No radiopaque stones. Degenerative changes in the spine.  IMPRESSION: No evidence of active pulmonary disease. Fibrosis or atelectasis in the left lung base.  Gaseous distention of mid abdominal small bowel with  multiple air-fluid levels consistent with mechanical small bowel obstruction.   Electronically Signed   By: Lucienne Capers M.D.   On: 11/24/2014 21:51   Dg Abd Portable 1v  11/28/2014   CLINICAL DATA:  Evaluate NG tube repositioning.  EXAM: PORTABLE ABDOMEN - 1 VIEW  COMPARISON:  11/27/2014  FINDINGS: The nasogastric tube has been repositioned. The tip is in the fundus of the stomach in the side port is below the GE junction. Surgical clips are noted in the left upper quadrant of the abdomen. Again noted are dilated loops of bowel measuring up to 5 cm.  IMPRESSION: 1. Nasogastric tube is in the stomach. 2. Persistent small bowel dilatation consistent with obstruction.   Electronically Signed   By: Kerby Moors M.D.   On: 11/28/2014 09:05   Dg Abd Portable 1v  11/26/2014   CLINICAL DATA:  Nasogastric tube placement  EXAM: PORTABLE ABDOMEN - 1 VIEW  COMPARISON:  Portable exam 1318 hours compared to 11/26/2014  FINDINGS: Tip of nasogastric tube projects over proximal stomach.  Numerous surgical clips in the perigastric region.  Multiple dilated loops of small bowel again identified compatible with small bowel obstruction.  Small amount of contrast within a decompressed colon.  No definite bowel wall thickening.  Lung bases clear.  IMPRESSION: Small bowel obstruction.  Tip of nasogastric tube projects over proximal stomach.   Electronically Signed   By: Lavonia Dana M.D.   On: 11/26/2014 16:49  Medical Consultants:    Infectious Disease: Carlyle Basques, MD  General Surgery: Alphonsa Overall, MD  Anti-Infectives:    Anti-infectives    Start     Dose/Rate Route Frequency Ordered Stop   12/10/14 1800  meropenem (MERREM) 1 g in sodium chloride 0.9 % 100 mL IVPB     1 g 200 mL/hr over 30 Minutes Intravenous Every 8 hours 12/10/14 1741     12/03/14 1300  DAPTOmycin (CUBICIN) 600 mg in sodium chloride 0.9 % IVPB     600 mg 224 mL/hr over 30 Minutes Intravenous Every 24 hours 12/03/14 1157      11/29/14 0830  [MAR Hold]  ceFAZolin (ANCEF) IVPB 2 g/50 mL premix     (MAR Hold since 11/29/14 0825)   2 g 100 mL/hr over 30 Minutes Intravenous On call to O.R. 11/28/14 0850 11/29/14 0908   11/25/14 0000  metroNIDAZOLE (FLAGYL) IVPB 500 mg  Status:  Discontinued     500 mg 100 mL/hr over 60 Minutes Intravenous Every 8 hours 11/24/14 2355 12/10/14 1729   11/25/14 0000  linezolid (ZYVOX) IVPB 600 mg  Status:  Discontinued     600 mg 300 mL/hr over 60 Minutes Intravenous Every 12 hours 11/24/14 2355 12/03/14 1145       Subjective:   Darryl Griffith is much more interactive and affect brighter today.  Pain is controlled.  No dyspnea.  Tolerating clear liquids.  Reports flatus/BM.   Objective:    Filed Vitals:   12/11/14 0800 12/11/14 1232 12/11/14 2242 12/12/14 0551  BP:   144/73 151/74  Pulse:   99 101  Temp:   98.5 F (36.9 C) 97.8 F (36.6 C)  TempSrc:   Oral Oral  Resp: 30 41 40 40  Height:      Weight:      SpO2:  95%  100%    Intake/Output Summary (Last 24 hours) at 12/12/14 0801 Last data filed at 12/12/14 0600  Gross per 24 hour  Intake 9415.75 ml  Output   2350 ml  Net 7065.75 ml   Filed Weights   11/24/14 2020  Weight: 73.936 kg (163 lb)    Exam: Gen:  NAD, affect brightrer Cardiovascular: Tachycardic, No M/R/G Respiratory:  Lungs diminished Gastrointestinal:  Abdomen soft, NT/ND, + BS Wound: Dressed, wound packed with erythema around edges, drain in place with brown secretions Extremities:  No C/E/C   Data Reviewed:    Labs: Basic Metabolic Panel:  Recent Labs Lab 12/07/14 0512 12/10/14 1701 12/11/14 0610  NA 131* 130* 130*  K 4.5 5.1 4.8  CL 103 105 101  CO2 20* 20* 19*  GLUCOSE 148* 154* 148*  BUN 37* 30* 32*  CREATININE 1.12 0.99 1.09  CALCIUM 7.8* 7.7* 8.2*  MG 1.9  --  1.9  PHOS 3.3  --  3.9   GFR Estimated Creatinine Clearance: 61 mL/min (by C-G formula based on Cr of 1.09). Liver Function Tests:  Recent Labs Lab  12/07/14 0512 12/11/14 0610  AST 16 17  ALT 9* 9*  ALKPHOS 74 111  BILITOT 0.6 0.4  PROT 5.0* 5.9*  ALBUMIN 2.1* 2.5*   CBC:  Recent Labs Lab 12/06/14 0500 12/08/14 0520 12/10/14 1701 12/11/14 0610  WBC 1.9* 3.6* 7.1 8.3  NEUTROABS  --  1.4*  --  6.3  HGB 7.6* 7.2* 8.3* 9.0*  HCT 23.4* 22.0* 25.3* 27.4*  MCV 82.7 84.0 84.9 84.3  PLT 377 430* 558* 638*   Cardiac Enzymes:  Recent Labs  Lab 12/11/14 0610  CKTOTAL 13*   CBG:  Recent Labs Lab 12/11/14 0658 12/11/14 1205 12/11/14 1909 12/11/14 2332 12/12/14 0630  GLUCAP 137* 175* 152* 137* 163*   Lipid Profile:  Recent Labs  12/11/14 0610  TRIG 123   Microbiology No results found for this or any previous visit (from the past 240 hour(s)).   Medications:   . sodium chloride   Intravenous Once  . acetaminophen  650 mg Oral Q6H  . bisacodyl  10 mg Rectal Daily  . DAPTOmycin (CUBICIN)  IV  600 mg Intravenous Q24H  . heparin subcutaneous  5,000 Units Subcutaneous 3 times per day  . insulin aspart  0-9 Units Subcutaneous 4 times per day  . lip balm  1 application Topical BID  . megestrol  400 mg Oral Daily  . meropenem (MERREM) IV  1 g Intravenous Q8H  . pantoprazole (PROTONIX) IV  40 mg Intravenous Q12H   Continuous Infusions: . sodium chloride Stopped (12/10/14 1900)  . Marland KitchenTPN (CLINIMIX-E) Adult 93 mL/hr at 12/11/14 1834   And  . fat emulsion 240 mL (12/11/14 1835)    Time spent: 25 minutes.    LOS: 18 days   [facility] Pager [phone]. If unable to reach me by pager, please call my cell phone at [phone].  *Please refer to [email or web address], password [email or web address] to get updated schedule on who will round on this patient, as hospitalists switch teams weekly. If 7PM-7AM, please contact night-coverage at [email or web address], password [email or web address] for any overnight needs.  12/12/2014, 8:01 AM

## 2014-12-12 NOTE — Progress Notes (Signed)
Clinical Social Work  CSW spoke with patient who reports he did not sleep well and is not feeling well today. Patient reports incontinence and that he is disappointed that he has to rely on others.   CSW spoke with CM who reports patient is not eligible for LTAC.  CSW spoke with dtr Barnett Applebaum) re: SNF placement. CSW explained that further information will need to be provided re: specific treatment needed at DC before SNF can give final determination.  CSW will continue to follow.  [place], [place] [phone]

## 2014-12-12 NOTE — Progress Notes (Signed)
Patient ID: Corderius Saraceni, male   DOB: 12-Aug-1945, 69 y.o.   MRN: [id number]   Progress Note: General Surgery Service   Subjective: Psychiatry saw, pt declined meds.  Wound consult saw for pressure sore.  PT working with.  Still not taking much in despite stooling, no nausea reported.  No antiemetics given.  Spoke with daughter about concerns.  Stopped dilaudid, IV muscle relaxants.    Objective: Vital signs in last 24 hours: Temp:  [97.8 F (36.6 C)-98.5 F (36.9 C)] 97.8 F (36.6 C) (09/13 0551) Pulse Rate:  [99-101] 101 (09/13 0551) Resp:  [40-41] 40 (09/13 0551) BP: (144-151)/(73-74) 151/74 mmHg (09/13 0551) SpO2:  [95 %-100 %] 100 % (09/13 0551) Last BM Date: 12/08/14  Intake/Output from previous day: 09/12 0701 - 09/13 0700 In: 9415.8 [IV Piggyback:412; TPN:9003.8] Out: 2350 [Urine:2350] Intake/Output this shift:    Lungs: breathing in 20s, coarse b/l  CV: tachycardic  Abd: soft, non distended, approp tender.  Wound with mild drainage.    Extremities: trace edema  Neuro: alert, oriented to self, place and event   Lab Results: CBC   Recent Labs  12/10/14 1701 12/11/14 0610  WBC 7.1 8.3  HGB 8.3* 9.0*  HCT 25.3* 27.4*  PLT 558* 638*   BMET  Recent Labs  12/10/14 1701 12/11/14 0610  NA 130* 130*  K 5.1 4.8  CL 105 101  CO2 20* 19*  GLUCOSE 154* 148*  BUN 30* 32*  CREATININE 0.99 1.09  CALCIUM 7.7* 8.2*   PT/INR No results for input(s): LABPROT, INR in the last 72 hours. ABG No results for input(s): PHART, HCO3 in the last 72 hours.  Invalid input(s): PCO2, PO2  Studies/Results: Dg Chest Port 1 View  12/10/2014   CLINICAL DATA:  Shortness of breath, tachycardia.  EXAM: PORTABLE CHEST - 1 VIEW  COMPARISON:  July 13, 2014.  FINDINGS: Stable cardiomediastinal silhouette. No pneumothorax is noted. Increased bilateral upper lobe opacities are noted concerning for pneumonia or possibly edema. Increased mild left pleural effusion is noted. Right-sided  PICC line is noted with distal tip overlying expected position of the SVC. Bony thorax is intact.  IMPRESSION: Increased bilateral upper lobe opacities are noted concerning for edema or possibly pneumonia. Increased mild left pleural effusion is noted.   Electronically Signed   By: Marijo Conception, M.D.   On: 12/10/2014 16:53    Anti-infectives: Anti-infectives    Start     Dose/Rate Route Frequency Ordered Stop   12/10/14 1800  meropenem (MERREM) 1 g in sodium chloride 0.9 % 100 mL IVPB     1 g 200 mL/hr over 30 Minutes Intravenous Every 8 hours 12/10/14 1741     12/03/14 1300  DAPTOmycin (CUBICIN) 600 mg in sodium chloride 0.9 % IVPB     600 mg 224 mL/hr over 30 Minutes Intravenous Every 24 hours 12/03/14 1157     11/29/14 0830  [MAR Hold]  ceFAZolin (ANCEF) IVPB 2 g/50 mL premix     (MAR Hold since 11/29/14 0825)   2 g 100 mL/hr over 30 Minutes Intravenous On call to O.R. 11/28/14 0850 11/29/14 0908   11/25/14 0000  metroNIDAZOLE (FLAGYL) IVPB 500 mg  Status:  Discontinued     500 mg 100 mL/hr over 60 Minutes Intravenous Every 8 hours 11/24/14 2355 12/10/14 1729   11/25/14 0000  linezolid (ZYVOX) IVPB 600 mg  Status:  Discontinued     600 mg 300 mL/hr over 60 Minutes Intravenous Every 12 hours 11/24/14  2355 12/03/14 1145      Medicaions: Scheduled Meds: . acetaminophen  650 mg Oral Q6H  . bisacodyl  10 mg Rectal Daily  . DAPTOmycin (CUBICIN)  IV  600 mg Intravenous Q24H  . heparin subcutaneous  5,000 Units Subcutaneous 3 times per day  . insulin aspart  0-15 Units Subcutaneous 4 times per day  . lip balm  1 application Topical BID  . megestrol  400 mg Oral Daily  . meropenem (MERREM) IV  1 g Intravenous Q8H  . pantoprazole (PROTONIX) IV  40 mg Intravenous Q12H   Continuous Infusions: . sodium chloride Stopped (12/10/14 1900)  . Marland KitchenTPN (CLINIMIX-E) Adult 93 mL/hr at 12/11/14 1834   And  . fat emulsion 240 mL (12/11/14 1835)   PRN Meds:.acetaminophen (TYLENOL) oral liquid 160  mg/5 mL, [DISCONTINUED] acetaminophen **OR** acetaminophen, alum & mag hydroxide-simeth, baclofen, clonazepam, hydrALAZINE, LORazepam, magic mouthwash, menthol-cetylpyridinium, metoprolol, ondansetron **OR** [DISCONTINUED] ondansetron (ZOFRAN) IV, oxyCODONE, phenol, promethazine, sodium chloride, traMADol  Assessment/Plan: Patient Active Problem List   Diagnosis Date Noted  . Pressure ulcer 12/11/2014  . Acute pulmonary edema 12/11/2014  . Fluid collection at surgical site   . Small bowel obstruction   . Hypokalemia 11/25/2014  . Spinal stenosis 11/25/2014  . Essential (primary) hypertension 11/25/2014  . Mixed hyperlipidemia 11/25/2014  . Malnutrition of moderate degree 11/08/2014  . Recurrent SBO (small bowel obstruction) 11/07/2014  . Abscess, retroperitoneal   . Melena 09/18/2014  . Hyponatremia 09/18/2014  . Hyperglycemia 09/18/2014  . Hypoalbuminemia 09/18/2014  . Anemia of chronic disease 09/18/2014  . Hypertension   . 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  Once cleaner, will start vac.     Advance diet.   LOS: 18 days   [place], [facility]

## 2014-12-12 NOTE — Progress Notes (Signed)
Patient ID: Darryl Griffith, male   DOB: [birth year], 69 y.o.   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 acetaminophen  650 mg Oral Q6H  . bisacodyl  10 mg Rectal Daily  . DAPTOmycin (CUBICIN)  IV  600 mg Intravenous Q24H  . heparin subcutaneous  5,000 Units Subcutaneous 3 times per day  . insulin aspart  0-15 Units Subcutaneous 4 times per day  . lip balm  1 application Topical BID  . megestrol  400 mg Oral Daily  . meropenem (MERREM) IV  1 g Intravenous Q8H  . pantoprazole (PROTONIX) IV  40 mg Intravenous Q12H  . sertraline  25 mg Oral Daily    Past Medical History  Diagnosis Date  . Hypertension   . Anginal pain     hx of CP saw Dr Wynonia Lawman 09/20/2013 H&P per chart   . Left Retroperitoneal mass s/p resection/L nephrectomy 07/13/2014   . Postoperative anemia due to acute blood loss 07/15/2014  . SBO (small bowel obstruction) 08/01/2014, 10/11/14  . Retroperitoneal fluid collection   . 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 Impaired fasting glucose 11/25/2014  . Benign neoplasm of colon 11/25/2014  . Actinic keratosis 11/25/2014  . Mixed hyperlipidemia 11/25/2014    Social History  Substance Use Topics  . Smoking status: Never Smoker   . Smokeless tobacco: Never Used  . Alcohol Use: No    Family History  Problem Relation Age of  Onset  . Adopted: Yes  . Family history unknown: Yes   Allergies  Allergen Reactions  . Compazine [Prochlorperazine] Other (See Comments)    Made pt really lethargic, and sleepy    SUBJECTIVE: He is feeling better today. However, his wife notes that he started having some diarrhea.  OBJECTIVE: Filed Vitals:   12/11/14 0800 12/11/14 1232 12/11/14 2242 12/12/14 0551  BP:   144/73 151/74  Pulse:   99 101  Temp:   98.5 F (36.9 C) 97.8 F (36.6 C)  TempSrc:   Oral Oral  Resp: 30 41 40 40  Height:      Weight:      SpO2:  95%  100%   Body mass index is 24.79 kg/(m^2).  General: He is alert and appears comfortable. He is sitting up in a chair eating lunch Abdomen: Wound is now packed with gauze wet-to-dry dressing. Drain output has not been recorded since 12/10/2014  Lab Results Lab Results  Component Value Date   WBC 8.3 12/11/2014   HGB 9.0* 12/11/2014   HCT 27.4* 12/11/2014   MCV 84.3 12/11/2014   PLT 638* 12/11/2014    Lab Results  Component Value Date   CREATININE  1.09 12/11/2014   BUN 32* 12/11/2014   NA 130* 12/11/2014   K 4.8 12/11/2014   CL 101 12/11/2014   CO2 19* 12/11/2014    Lab Results  Component Value Date   ALT 9* 12/11/2014   AST 17 12/11/2014   ALKPHOS 111 12/11/2014   BILITOT 0.4 12/11/2014     Microbiology: No results found for this or any previous visit (from the past 240 hour(s)).   ASSESSMENT: I discussed Darryl Griffith difficult situation with him and his wife. They are hoping to be able to transfer him to a skilled nursing facility in [place] soon as she will be moving to [place]. That would allow them to be close to their daughter, Darryl Griffith. They're trying to arrange infectious disease follow-up in [place]. I let them know that I believe he will need to be on antibiotics for several more weeks at a minimum but that the optimal duration of treatment will depend on how his wound looks and heels. Given the onset of diarrhea I will screen  him for C. difficile colitis.  PLAN: 1. Continue daptomycin and meropenem for now 2. Stool for C. difficile screen  [place] [phone] pager   [phone] cell 12/12/2014, 2:55 PM

## 2014-12-13 DIAGNOSIS — L98429 Non-pressure chronic ulcer of back with unspecified severity: Secondary | ICD-10-CM | POA: Diagnosis present

## 2014-12-13 DIAGNOSIS — D702 Other drug-induced agranulocytosis: Secondary | ICD-10-CM

## 2014-12-13 DIAGNOSIS — D62 Acute posthemorrhagic anemia: Secondary | ICD-10-CM | POA: Diagnosis present

## 2014-12-13 DIAGNOSIS — K9189 Other postprocedural complications and disorders of digestive system: Secondary | ICD-10-CM

## 2014-12-13 DIAGNOSIS — K567 Ileus, unspecified: Secondary | ICD-10-CM | POA: Diagnosis present

## 2014-12-13 DIAGNOSIS — E876 Hypokalemia: Secondary | ICD-10-CM

## 2014-12-13 DIAGNOSIS — F32A Depression, unspecified: Secondary | ICD-10-CM | POA: Diagnosis present

## 2014-12-13 DIAGNOSIS — F329 Major depressive disorder, single episode, unspecified: Secondary | ICD-10-CM

## 2014-12-13 DIAGNOSIS — E440 Moderate protein-calorie malnutrition: Secondary | ICD-10-CM | POA: Insufficient documentation

## 2014-12-13 DIAGNOSIS — E43 Unspecified severe protein-calorie malnutrition: Secondary | ICD-10-CM | POA: Diagnosis present

## 2014-12-13 DIAGNOSIS — I10 Essential (primary) hypertension: Secondary | ICD-10-CM | POA: Diagnosis present

## 2014-12-13 DIAGNOSIS — R627 Adult failure to thrive: Secondary | ICD-10-CM

## 2014-12-13 DIAGNOSIS — J810 Acute pulmonary edema: Secondary | ICD-10-CM

## 2014-12-13 DIAGNOSIS — T888XXD Other specified complications of surgical and medical care, not elsewhere classified, subsequent encounter: Secondary | ICD-10-CM

## 2014-12-13 DIAGNOSIS — E871 Hypo-osmolality and hyponatremia: Secondary | ICD-10-CM

## 2014-12-13 LAB — GLUCOSE, CAPILLARY
GLUCOSE-CAPILLARY: 135 mg/dL — AB (ref 65–99)
GLUCOSE-CAPILLARY: 151 mg/dL — AB (ref 65–99)
GLUCOSE-CAPILLARY: 115 mg/dL — AB (ref 65–99)
Glucose-Capillary: 136 mg/dL — ABNORMAL HIGH (ref 65–99)

## 2014-12-13 MED ORDER — TRACE MINERALS CR-CU-MN-SE-ZN 10-1000-500-60 MCG/ML IV SOLN
INTRAVENOUS | Status: AC
  Administered 2014-12-13: 18:00:00 via INTRAVENOUS
  Filled 2014-12-13: qty 1992

## 2014-12-13 MED ORDER — FAT EMULSION 20 % IV EMUL
240.0000 mL | INTRAVENOUS | Status: AC
  Administered 2014-12-13: 240 mL via INTRAVENOUS
  Filled 2014-12-13: qty 250

## 2014-12-13 NOTE — Progress Notes (Addendum)
Patient ID: Darryl Griffith, male   DOB: Apr 24, 1945, 69 y.o.   MRN: [id number] [facility] PROGRESS NOTE  Darryl Griffith VHQ:[id number] DOB: 08/03/1945 DOA: 70/26/2016 PCP: Gennette Pac, MD  Brief narrative:    69 y.o. male with past medical history of left flank liposarcoma S/P Resection and left nephrectomy 06/2014, status post radiation treatment completed 41/3244 complicated by an abscess formation (+VRE), s/p drain placement for ~ 3 weeks (removed 10/31/14). Pt was admitted on 11/24/14 with nausea and vomiting and was found to have SBO. General surgery was consulted on admission. Patient did not improve with conservative measures and underwent surgery 11/29/14: incision and drainage of left back abscess, exploratory laparotomy, lysis of adhesions, small bowel resection and placement of drain into left retroperitoneal abscess. The patient has made slow progress over the course of his hospital stay, requiring TNA for a prolonged ileus and ongoing IV antibiotics for persistent infection. At this point, the surgical and ID consultants are driving his care.   Anticipated discharge: Unclear what the anticipated D/C date is at this point. The goal at this time is to see if TNA can be weaned off. Currently on full liquids.   Assessment/Plan:    Principal problem:  SBO status post resection with lysis of adhesions / postoperative ileus / left retroperitoneal abscess  -Pt presented to hospital with nausea and vomiting. He was found to have SBO. - Pt underwent surgical repair on 8/31 with exploratory laparotomy, lysis of adhesions, small bowel resection, placement of drain into left retroperitoneal abscess.  - Wound opened 12/10/14.Per surgery, most recently they recommended switching to wound vac. - Wound care assessment done 12/13/2014. Measurement:10.5cm x 4cm x 2cm with undermining from 11-1 o'clock measuring 1.5cm. Dressing recommendations: Wound cleansed with NS, patted dry. White foam (1  piece) placed as a wound contact layer due to exposed sutures. This was topped with black Granufoam (1 piece) and covered with drape. Dressing is attached to negative pressure (174mmHg continuous) and an immediate seal was achieved. Next dressing change is scheduled for Friday, 12/15/14. - Continue TNA for nutritional support - Diet: full liquids.  - Appreciate surgery and ID following.   Active problems:  Acute pulmonary edema - CXR 12/10/14 showed findings consistent with edema. 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   Left retroperitoneal and left upper quadrant fluid collection  - Cultures initially positive for VRE. Most recent cultures from 11/29/14 negative. - Status post IR drainage 11/27/14. - ID following. Recommended to stop dapto today. Continue meropenem.  Sacral pressure ulcer, stage 2   - Partial thickness loss of dermis with shallow open ulcer with red, pink wound bed without slough, red to purple over entire sacrum, two areas approximately 10 x 5 cm each on both buttocks - Continue therapeutic mattress and pressure redistribution per nursing.  Depression - In the context of prolonged illness - Psychiatry consult requested. - Continue Zoloft 25 mg daily   Essential hypertension  - Continue hydralazine 5 mg every 6 hours as needed for blood pressure control   Hypokalemia / Hypomagnesemia - Likely from GI losses - Supplemented - Potassium and magnesium WNL   Symptomatic Anemia / Acute postoperative blood loss anemia  - Status post 2 units of PRBCs 11/27/14, 1 unit 12/01/14, 1 unit 12/05/14 and 1 unit 12/08/14. - Hemoglobin remains stable -  No current indications for transfusion   Hyponatremia - Due to GI losses, dehydration - Continue TPN, fluids -  Follow up BMP tomorrow am    Neutropenia, drug induced  - Probably related to treatment side Vioxx versus antibiotics.  - WBC count normalized   Failure to thrive in adult / Severe protein calorie malnutrition - In the context of prolonged illness - Nutrition consulted - Will probably improve as he tolerates more diet.  - Continue megace   DVT prophylaxis - Heparin subQ ordered      Code Status: Full Code Family Communication: Family not at the bedside this am  Disposition Plan: Patient's daughter wants him transferred to [place]. Attempted to transfer him however he was not accepted on transfer for given that this would be a lateral transfer of care. [facility] spoke with Dr Evonnie Dawes who recommended consideration for transfer to a [facility] [facility] based L-TAC but insurance will not pay for L-TAC.   IV access:  PICC line placed 10/12/14  Procedures and diagnostic studies in past 72 hours:    Dg Chest [facility] 12/10/2014    Increased bilateral upper lobe opacities are noted concerning for edema or possibly pneumonia. Increased mild left pleural effusion is noted.   Electronically Signed   By: Marijo Conception, M.D.   On: 12/10/2014 16:53    Medical Consultants:  Infectious Disease: Carlyle Basques, MD General Surgery: Alphonsa Overall, MD  Other Consultants:  Nutrition Physical therapy Social work [place]  IAnti-Infectives:   Total days of antibiotics 63 Day 11 daptomycin. Stopped dapto today 9/14. Day 5 meropenem    Leisa Lenz, MD  [facility] Pager [phone]  Time spent in minutes: 25 minutes  If 7PM-7AM, please contact night-coverage [email or web address] Password [email or web address] 12/13/2014, 8:07 PM   LOS: 19 days    HPI/Subjective: No acute overnight events. Patient reports no nausea or vomiting.   Objective: Filed Vitals:   12/12/14 1700 12/12/14 2130 12/13/14 0436 12/13/14 1816  BP: 133/85 145/81 152/82 142/75  Pulse: 120 109 106 114  Temp: 98.2 F (36.8 C) 97.7 F  (36.5 C) 98.2 F (36.8 C) 98.8 F (37.1 C)  TempSrc: Oral Oral Oral Oral  Resp: 32 26 24 24   Height:      Weight:      SpO2: 97% 98% 100% 99%    Intake/Output Summary (Last 24 hours) at 12/13/14 2007 Last data filed at 12/13/14 1900  Gross per 24 hour  Intake 3003.55 ml  Output   1550 ml  Net 1453.55 ml    Exam:   General:  Pt is alert, not in acute distress  Cardiovascular: Regular rate and rhythm, S1/S2 appreciated   Respiratory: Clear to auscultation bilaterally, no wheezing, no crackles, no rhonchi  Abdomen: Soft, non tender, non distended, bowel sounds present, dressing over abdomen  Extremities: trace edema, pulses DP and PT palpable bilaterally  Neuro: Grossly nonfocal  Data Reviewed: Basic Metabolic Panel:  Recent Labs Lab 12/07/14 0512 12/10/14 1701 12/11/14 0610  NA 131* 130* 130*  K 4.5 5.1 4.8  CL 103 105 101  CO2 20* 20* 19*  GLUCOSE 148* 154* 148*  BUN 37* 30* 32*  CREATININE 1.12 0.99 1.09  CALCIUM 7.8* 7.7* 8.2*  MG 1.9  --  1.9  PHOS 3.3  --  3.9   Liver Function Tests:  Recent Labs Lab 12/07/14 0512 12/11/14 0610  AST 16 17  ALT 9* 9*  ALKPHOS 74 111  BILITOT 0.6 0.4  PROT 5.0*  5.9*  ALBUMIN 2.1* 2.5*   No results for input(s): LIPASE, AMYLASE in the last 168 hours. No results for input(s): AMMONIA in the last 168 hours. CBC:  Recent Labs Lab 12/08/14 0520 12/10/14 1701 12/11/14 0610  WBC 3.6* 7.1 8.3  NEUTROABS 1.4*  --  6.3  HGB 7.2* 8.3* 9.0*  HCT 22.0* 25.3* 27.4*  MCV 84.0 84.9 84.3  PLT 430* 558* 638*   Cardiac Enzymes:  Recent Labs Lab 12/11/14 0610  CKTOTAL 13*   BNP: Invalid input(s): POCBNP CBG:  Recent Labs Lab 12/12/14 1657 12/13/14 0004 12/13/14 0411 12/13/14 1304 12/13/14 1759  GLUCAP 131* 136* 135* 151* 115*    No results found for this or any previous visit (from the past 240 hour(s)).   Scheduled Meds: . acetaminophen  650 mg Oral Q6H  . bisacodyl  10 mg Rectal Daily  .  heparin subcutaneous  5,000 Units Subcutaneous 3 times per day  . insulin aspart  0-15 Units Subcutaneous 4 times per day  . megestrol  400 mg Oral Daily  . meropenem (MERREM) IV  1 g Intravenous Q8H  . pantoprazole (PROTONIX) IV  40 mg Intravenous Q12H  . sertraline  25 mg Oral Daily   Continuous Infusions: . sodium chloride Stopped (12/10/14 1900)  . Marland KitchenTPN (CLINIMIX-E) Adult 83 mL/hr at 12/13/14 1747   And  . fat emulsion 240 mL (12/13/14 1747)

## 2014-12-13 NOTE — Progress Notes (Signed)
PARENTERAL NUTRITION CONSULT NOTE - Follow-Up  Pharmacy Consult for TPN Indication: SBO  Allergies  Allergen Reactions  . Compazine [Prochlorperazine] Other (See Comments)    Made pt really lethargic, and sleepy   Patient Measurements: Height: 5' 8"  (172.7 cm) Weight: 163 lb (73.936 kg) IBW/kg (Calculated) : 68.4  Vital Signs: Temp: 98.2 F (36.8 C) (09/14 0436) Temp Source: Oral (09/14 0436) BP: 152/82 mmHg (09/14 0436) Pulse Rate: 106 (09/14 0436) Intake/Output from previous day: 09/13 0701 - 09/14 0700 In: 2933.6 [P.O.:340; I.V.:240; TPN:2353.6] Out: 2400 [Urine:2400] Intake/Output from this shift:    Labs:  Recent Labs  12/10/14 1701 12/11/14 0610  WBC 7.1 8.3  HGB 8.3* 9.0*  HCT 25.3* 27.4*  PLT 558* 638*    Recent Labs  12/10/14 1701 12/11/14 0610  NA 130* 130*  K 5.1 4.8  CL 105 101  CO2 20* 19*  GLUCOSE 154* 148*  BUN 30* 32*  CREATININE 0.99 1.09  CALCIUM 7.7* 8.2*  MG  --  1.9  PHOS  --  3.9  PROT  --  5.9*  ALBUMIN  --  2.5*  AST  --  17  ALT  --  9*  ALKPHOS  --  111  BILITOT  --  0.4  PREALBUMIN  --  6.0*  TRIG  --  123   Estimated Creatinine Clearance: 61 mL/min (by C-G formula based on Cr of 1.09).    Recent Labs  12/12/14 1657 12/13/14 0004 12/13/14 0411  GLUCAP 131* 136* 135*    Medical History: Past Medical History  Diagnosis Date  . Hypertension   . Anginal pain     hx of CP saw Dr Wynonia Lawman 09/20/2013 H&P per chart   . Left Retroperitoneal mass s/p resection/L nephrectomy 07/13/2014   . Postoperative anemia due to acute blood loss 07/15/2014  . SBO (small bowel obstruction) 08/01/2014, 10/11/14  . Retroperitoneal fluid collection   . 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 Impaired fasting glucose 11/25/2014  . Benign neoplasm of colon 11/25/2014  . Actinic keratosis 11/25/2014  . Mixed hyperlipidemia 11/25/2014    Insulin Requirements: 6  units over the past 24 hours (on sensitive SSI q6h) - no hx DM  Current Nutrition: Clear liquid diet (appetite poor)  IVF: NS @ KVO  Central access: PICC placed 10/12/14 TPN start date: 8/28  ASSESSMENT                                                                                                          HPI:  89 yoM s/p resection L retroperitoneal sarcoma, L nephrectomy 07/13/14 developed VRE abscess. Hx abd bloating, N/V 2 days PTA, SBO seen on CT for Ca restaging.  Hx recurrent SBO. Begin TPN per Pharmacy  Significant events:   8/29: plan for exp laparotomy on 8/31; s/p aspiration of L retroperitoneal fluid collection on 8/29 8/31:  s/p exp lap -->  I&D L back abscess, lysis of adhesions, small bowel resection, placement og drain into  L retroperitoneal abscess 9/2: bloody NGT output, PPI IV BID added;  change clinimix E 5/15 to E 5/20 9/7: NGT clamp study- minimal residual and no n/v 9/8: Clear liquid diet ordered  9/9: NGT removed 9/14: pt tolerating liquid diet but just not interested in eating  Today:   Glucose (goal <150):   Electrolytes - Na low at 130, Mag 1.9;  K, phos, CoCa WNL   Renal - WNL   LFTs - AST/ALT low, Alk Phos, Tbili WNL   TGs - 123 (9/12)  Prealbumin - 6.1 (9/5) -->6.0 (9/12)  NUTRITIONAL GOALS                                                                                             RD recs:   Kcal: 2300-2500  Protein: 110-120 grams  Fluid: 2.2-2.5 L/day  Clinimix E 5/20 at a rate of 83 ml/hr + 20% fat emulsion at 10 ml/hr to provide: 100 g/day protein, 2233 Kcal/day (>90% nutritional goals per dietician recommendations)   PLAN                                                                                                                         1) At 1800 today:  Continue Clinimix E 5/20 to 83 ml/hr.  20% fat emulsion at 10 ml/hr.  TPN to contain standard multivitamins and trace elements.  Continue IVF at [facility].  Continue   moderate SSI q6h.  TPN lab panels on Mondays & Thursdays.  Advancing diet/ Encourage PO intake, megace started  F/u daily.  Dolly Rias RPh 12/13/2014, 10:11 AM Pager [phone]

## 2014-12-13 NOTE — Consult Note (Signed)
[place] [facility] Consult Follow Up  Reason for Consult:  Depression and refusing medical treatment Referring Physician:  Dr. Rockne Menghini Patient Identification: Darryl Griffith MRN:  [id number] Principal Diagnosis: SBO (small bowel obstruction) Diagnosis:   Patient Active Problem List   Diagnosis Date Noted  . Pressure ulcer [L89.90] 12/11/2014  . Acute pulmonary edema [J81.0] 12/11/2014  . Fluid collection at surgical site [T88.8XXA]   . Small bowel obstruction [K56.69]   . Hypokalemia [E87.6] 11/25/2014  . Spinal stenosis [M48.00] 11/25/2014  . Essential (primary) hypertension [I10] 11/25/2014  . Mixed hyperlipidemia [E78.2] 11/25/2014  . Malnutrition of moderate degree [E44.0] 11/08/2014  . 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 Abscess, retroperitoneal [K68.19]   . Melena [K92.1] 09/18/2014  . Hyponatremia [E87.1] 09/18/2014  . Hyperglycemia [R73.9] 09/18/2014  . Hypoalbuminemia [E88.09] 09/18/2014  . Anemia of chronic disease [D63.8] 09/18/2014  . Hypertension [I10]   . 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 male patient admitted with depression secondary to medical problems and complications especially small bowel obstruction.  HPI:  [name] is an 69 y.o. male seen face-to-face for psychiatric consultation along with psychiatric social service and also reviewed chart. Psychiatric consultation was requested secondary to increased symptoms of depression, isolation, withdrawn, lack of energy and motivation along with poor compliance with treatment. Patient reported he felt that way yesterday but today he is feeling little better and has been compliant with the medication management and treatment. Patient endorses feeling down because of ongoing complications of his medical  problem and recurrent small bowel obstruction. Patient stated he needed 3 people to go to bathroom which is frustrating to him. Patient appeared with depressed mood and restricted affect. Patient has a low-volume of speech but pleasant during my evaluation. Spoke with the patient wife who was at bedside and seems to be very supportive to him. Patient has denied suicidal/homicidal ideation, intention or plans. Patient has no previous history of psychiatric inpatient hospitalization or outpatient treatment. Patient stated he is able to take liquid diet at this time.   Interval History: Patient seen with psych social service today, he appeared calm and relaxing in his bed and reportedly his wife continues to be anxious about his out come. Patient has disturbed sleep and taking lilquid diet only. Patient reports incontinence and that he is disappointed that he has to rely on others. Patient has been tolerating zoloft without adverse effects.  Past Medical History:  Past Medical History  Diagnosis Date  . Hypertension   . Anginal pain     hx of CP saw Dr Wynonia Lawman 09/20/2013 H&P per chart   . Left Retroperitoneal mass s/p resection/L nephrectomy 07/13/2014   . Postoperative anemia due to acute blood loss 07/15/2014  . SBO (small bowel obstruction) 08/01/2014, 10/11/14  . Retroperitoneal fluid collection   . 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 Impaired fasting glucose 11/25/2014  . Benign neoplasm of colon 11/25/2014  . Actinic keratosis 11/25/2014  . Mixed hyperlipidemia 11/25/2014    Past Surgical History  Procedure Laterality Date  . Surgery for arm fracture Right 1952  . Knee arthroscopy Right 1970's  . Colonoscopy with propofol N/A 10/20/2013    Procedure: COLONOSCOPY WITH PROPOFOL;  Surgeon: Lear Ng, MD;  Location: [facility];  Service: Endoscopy;  Laterality: N/A;  . Hot hemostasis N/A 10/20/2013     Procedure: HOT HEMOSTASIS (ARGON PLASMA COAGULATION/BICAP);  Surgeon: Lear Ng, MD;  Location: [facility];  Service: Endoscopy;  Laterality: N/A;  . Tigger finger release      per H&P per Dr Wynonia Lawman 09/20/2013 (on chart)  . Resection of retroperitoneal mass Left 07/13/2014    Procedure: RESECTION OF left RETROPERITONEAL MASS;  Surgeon: Stark Klein, MD;  Location: [facility];  Service: General;  Laterality: Left;  . Esophagogastroduodenoscopy (egd) with propofol N/A 09/19/2014    Procedure: ESOPHAGOGASTRODUODENOSCOPY (EGD) WITH PROPOFOL;  Surgeon: Wilford Corner, MD;  Location: [facility];  Service: Endoscopy;  Laterality: N/A;  . Laparotomy N/A 11/29/2014    Procedure: EXPLORATORY LAPAROTOMY SMALL BOWEL RESECTION;  Surgeon: Stark Klein, MD;  Location: [facility];  Service: General;  Laterality: N/A;  . Irrigation and debridement abscess N/A 11/29/2014    Procedure: IRRIGATION AND DEBRIDEMENT BACK ABSCESS;  Surgeon: Stark Klein, MD;  Location: [facility];  Service: General;  Laterality: N/A;   Family History:  Family History  Problem Relation Age of Onset  . Adopted: Yes  . Family history unknown: Yes   Social History:  History  Alcohol Use No     History  Drug Use No    Social History   Social History  . Marital Status: Married    Spouse Name: [name]  . Number of Children: 4  . Years of Education: N/A   Occupational History  . Retired Armed forces operational officer    Social History Main Topics  . Smoking status: Never Smoker   . Smokeless tobacco: Never Used  . Alcohol Use: No  . Drug Use: No  . Sexual Activity: Not Currently   Other Topics Concern  . None   Social History Narrative   Married.  Lives in [place] with wife.   Additional Social History:                          Allergies:   Allergies  Allergen Reactions  . Compazine [Prochlorperazine] Other (See Comments)    Made pt really lethargic, and sleepy    Labs:  Results for orders placed or performed  during the hospital encounter of 11/24/14 (from the past 48 hour(s))  Glucose, capillary     Status: Abnormal   Collection Time: 12/11/14  7:09 PM  Result Value Ref Range   Glucose-Capillary 152 (H) 65 - 99 mg/dL  Glucose, capillary     Status: Abnormal   Collection Time: 12/11/14 11:32 PM  Result Value Ref Range   Glucose-Capillary 137 (H) 65 - 99 mg/dL  Glucose, capillary     Status: Abnormal   Collection Time: 12/12/14  6:30 AM  Result Value Ref Range   Glucose-Capillary 163 (H) 65 - 99 mg/dL  Glucose, capillary     Status: Abnormal   Collection Time: 12/12/14 12:54 PM  Result Value Ref Range   Glucose-Capillary 158 (H) 65 - 99 mg/dL  Glucose, capillary     Status: Abnormal   Collection Time: 12/12/14  4:57 PM  Result Value Ref Range   Glucose-Capillary 131 (H) 65 - 99 mg/dL  Glucose, capillary     Status: Abnormal   Collection Time: 12/13/14 12:04 AM  Result Value Ref Range   Glucose-Capillary 136 (H) 65 - 99 mg/dL   Comment 1 Notify RN   Glucose, capillary     Status: Abnormal  Collection Time: 12/13/14  4:11 AM  Result Value Ref Range   Glucose-Capillary 135 (H) 65 - 99 mg/dL  Glucose, capillary     Status: Abnormal   Collection Time: 12/13/14  1:04 PM  Result Value Ref Range   Glucose-Capillary 151 (H) 65 - 99 mg/dL    Vitals: Blood pressure 152/82, pulse 106, temperature 98.2 F (36.8 C), temperature source Oral, resp. rate 24, height 5\' 8"  (1.727 m), weight 73.936 kg (163 lb), SpO2 100 %.  Risk to Self: Is patient at risk for suicide?: No Risk to Others:   Prior Inpatient Therapy:   Prior Outpatient Therapy:    Current Facility-Administered Medications  Medication Dose Route Frequency Provider Last Rate Last Dose  . Marland KitchenTPN (CLINIMIX-E) Adult   Intravenous Continuous TPN Thomes Lolling, RPH 83 mL/hr at 12/12/14 1759     And  . fat emulsion 20 % infusion 240 mL  240 mL Intravenous Continuous TPN Thomes Lolling, RPH 10 mL/hr at 12/12/14 1759 240 mL at  12/12/14 1759  . 0.9 %  sodium chloride infusion   Intravenous Continuous Lynelle Doctor, RPH   Stopped at 12/10/14 1900  . acetaminophen (TYLENOL) solution 650 mg  650 mg Oral Q4H PRN Rhetta Mura Schorr, NP      . acetaminophen (TYLENOL) suppository 650 mg  650 mg Rectal Q6H PRN Theressa Millard, MD      . acetaminophen (TYLENOL) tablet 650 mg  650 mg Oral Q6H Stark Klein, MD   650 mg at 12/13/14 1330  . alum & mag hydroxide-simeth (MAALOX/MYLANTA) 200-200-20 MG/5ML suspension 30 mL  30 mL Oral Q6H PRN Michael Boston, MD      . baclofen (LIORESAL) tablet 10 mg  10 mg Oral TID PRN Stark Klein, MD      . bisacodyl (DULCOLAX) suppository 10 mg  10 mg Rectal Daily Michael Boston, MD   10 mg at 12/11/14 1000  . clonazepam (KLONOPIN) disintegrating tablet 0.5 mg  0.5 mg Oral BID PRN Venetia Maxon Rama, MD   0.5 mg at 12/13/14 1430  . DAPTOmycin (CUBICIN) 600 mg in sodium chloride 0.9 % IVPB  600 mg Intravenous Q24H Hosie Poisson, MD   600 mg at 12/13/14 1331  . TPN (CLINIMIX-E) Adult   Intravenous Continuous TPN Angela Adam, [facility]       And  . fat emulsion 20 % infusion 240 mL  240 mL Intravenous Continuous TPN Angela Adam, [facility]      . heparin injection 5,000 Units  5,000 Units Subcutaneous 3 times per day Alphonsa Overall, MD   5,000 Units at 12/13/14 1330  . hydrALAZINE (APRESOLINE) injection 5 mg  5 mg Intravenous Q6H PRN Hosie Poisson, MD      . insulin aspart (novoLOG) injection 0-15 Units  0-15 Units Subcutaneous 4 times per day Thomes Lolling, RPH   3 Units at 12/13/14 1330  . lip balm (CARMEX) ointment 1 application  1 application Topical BID Michael Boston, MD   1 application at 00/37/04 1000  . LORazepam (ATIVAN) injection 0.5 mg  0.5 mg Intravenous QHS PRN Caren Griffins, MD   0.5 mg at 12/12/14 2323  . magic mouthwash  15 mL Oral QID PRN Michael Boston, MD      . megestrol (MEGACE) 400 MG/10ML suspension 400 mg  400 mg Oral Daily Stark Klein, MD   400 mg at 12/13/14 0958  .  menthol-cetylpyridinium (CEPACOL) lozenge 3 mg  1 lozenge Oral PRN Remo Lipps  Gross, MD      . meropenem (MERREM) 1 g in sodium chloride 0.9 % 100 mL IVPB  1 g Intravenous Q8H Anh P Pham, RPH   1 g at 12/13/14 0957  . metoprolol (LOPRESSOR) injection 5 mg  5 mg Intravenous Q6H PRN Michael Boston, MD   5 mg at 12/07/14 0616  . ondansetron (ZOFRAN) tablet 4 mg  4 mg Oral Q6H PRN Theressa Millard, MD      . oxyCODONE (Oxy IR/ROXICODONE) immediate release tablet 5-10 mg  5-10 mg Oral Q4H PRN Stark Klein, MD   10 mg at 12/13/14 1430  . pantoprazole (PROTONIX) injection 40 mg  40 mg Intravenous Q12H Stark Klein, MD   40 mg at 12/13/14 0958  . phenol (CHLORASEPTIC) mouth spray 1 spray  1 spray Mouth/Throat PRN Caren Griffins, MD   1 spray at 11/25/14 1154  . promethazine (PHENERGAN) injection 6.25-12.5 mg  6.25-12.5 mg Intravenous Q4H PRN Michael Boston, MD      . sertraline (ZOLOFT) tablet 25 mg  25 mg Oral Daily Ambrose Finland, MD   25 mg at 12/13/14 0958  . sodium chloride 0.9 % injection 10-40 mL  10-40 mL Intracatheter PRN Alphonsa Overall, MD   10 mL at 12/07/14 0512  . traMADol (ULTRAM) tablet 50-100 mg  50-100 mg Oral Q6H PRN Stark Klein, MD        Musculoskeletal: Strength & Muscle Tone: decreased Gait & Station: unable to stand Patient leans: N/A  Psychiatric Specialty Exam: Physical Exam as per history and physical   ROS complains about generalized weakness, lack of energy, lack of motivation and interest, also reported depression with frustration patient denies nausea or vomiting shortness of breath or chest pain  Blood pressure 152/82, pulse 106, temperature 98.2 F (36.8 C), temperature source Oral, resp. rate 24, height 5\' 8"  (1.727 m), weight 73.936 kg (163 lb), SpO2 100 %.Body mass index is 24.79 kg/(m^2).  General Appearance: Guarded  Eye Contact::  Good  Speech:  Clear and Coherent and Slow  Volume:  Decreased  Mood:  Depressed  Affect:  Constricted and Depressed  Thought  Process:  Coherent and Goal Directed  Orientation:  Full (Time, Place, and Person)  Thought Content:  WDL  Suicidal Thoughts:  No  Homicidal Thoughts:  No  Memory:  Immediate;   Fair Recent;   Fair  Judgement:  Intact  Insight:  Fair  Psychomotor Activity:  Decreased  Concentration:  Fair  Recall:  [employer] of Knowledge:Good  Language: Good  Akathisia:  Negative  Handed:  Right  AIMS (if indicated):     Assets:  Communication Skills Desire for Improvement Financial Resources/Insurance Housing Intimacy Leisure Time Resilience Social Support Transportation  ADL's:  Impaired  Cognition: Impaired,  Mild  Sleep:      Medical Decision Making: Review of Psycho-Social Stressors (1), Review or order clinical lab tests (1), New Problem, with no additional work-up planned (3), Review of Last Therapy Session (1), Review or order medicine tests (1), Review of Medication Regimen & Side Effects (2) and Review of New Medication or Change in Dosage (2)  Treatment Plan Summary: patient has a little improvement on his depression, anxiety and frustration.  Daily contact with patient to assess and evaluate symptoms and progress in treatment and Medication management  Plan:  Continue Zoloft 25 mg daily for depression which can be increased up to 200 mg if tolerated and benefits  Patient encouraged to be compliant with the treatment  while in the hospital Patient does not meet criteria for psychiatric inpatient admission. Supportive therapy provided about ongoing stressors.  Appreciate psychiatric consultation Please contact [phone] or [phone] if needs further assistance   Disposition: Patient benefit from medication management and may needed skilled nursing facility or palliative care  Odis Wickey,JANARDHAHA R. 12/13/2014 3:29 PM

## 2014-12-13 NOTE — Progress Notes (Signed)
Patient ID: Darryl Griffith, male   DOB: 05-15-1945, 69 y.o.   MRN: [id number]  Progress Note: General Surgery Service   Subjective: Pt more alert and trying more foods.  Ambulated down hall yesterday.  Objective: Vital signs in last 24 hours: Temp:  [97.7 F (36.5 C)-98.2 F (36.8 C)] 98.2 F (36.8 C) (09/14 0436) Pulse Rate:  [106-120] 106 (09/14 0436) Resp:  [24-32] 24 (09/14 0436) BP: (133-152)/(81-85) 152/82 mmHg (09/14 0436) SpO2:  [97 %-100 %] 100 % (09/14 0436) Last BM Date: 12/12/14  Intake/Output from previous day: 09/13 0701 - 09/14 0700 In: 2933.6 [P.O.:340; I.V.:240; TPN:2353.6] Out: 2400 [Urine:2400] Intake/Output this shift:   Lungs: breathing comfortably CV: tachycardic Abd: soft, non distended, approp tender.  Wound cleaning up.  Erythema resolved.  Back wound nearly healed.   Drain looks like old blood.   Extremities: trace edema Neuro: alert, oriented to self, place and event   Lab Results: CBC   Recent Labs  12/10/14 1701 12/11/14 0610  WBC 7.1 8.3  HGB 8.3* 9.0*  HCT 25.3* 27.4*  PLT 558* 638*   BMET  Recent Labs  12/10/14 1701 12/11/14 0610  NA 130* 130*  K 5.1 4.8  CL 105 101  CO2 20* 19*  GLUCOSE 154* 148*  BUN 30* 32*  CREATININE 0.99 1.09  CALCIUM 7.7* 8.2*   PT/INR No results for input(s): LABPROT, INR in the last 72 hours. ABG No results for input(s): PHART, HCO3 in the last 72 hours.  Invalid input(s): PCO2, PO2  Studies/Results: No results found.  Anti-infectives: Anti-infectives    Start     Dose/Rate Route Frequency Ordered Stop   12/10/14 1800  meropenem (MERREM) 1 g in sodium chloride 0.9 % 100 mL IVPB     1 g 200 mL/hr over 30 Minutes Intravenous Every 8 hours 12/10/14 1741     12/03/14 1300  DAPTOmycin (CUBICIN) 600 mg in sodium chloride 0.9 % IVPB     600 mg 224 mL/hr over 30 Minutes Intravenous Every 24 hours 12/03/14 1157     11/29/14 0830  [MAR Hold]  ceFAZolin (ANCEF) IVPB 2 g/50 mL premix     (MAR Hold  since 11/29/14 0825)   2 g 100 mL/hr over 30 Minutes Intravenous On call to O.R. 11/28/14 0850 11/29/14 0908   11/25/14 0000  metroNIDAZOLE (FLAGYL) IVPB 500 mg  Status:  Discontinued     500 mg 100 mL/hr over 60 Minutes Intravenous Every 8 hours 11/24/14 2355 12/10/14 1729   11/25/14 0000  linezolid (ZYVOX) IVPB 600 mg  Status:  Discontinued     600 mg 300 mL/hr over 60 Minutes Intravenous Every 12 hours 11/24/14 2355 12/03/14 1145      Medicaions: Scheduled Meds: . acetaminophen  650 mg Oral Q6H  . bisacodyl  10 mg Rectal Daily  . DAPTOmycin (CUBICIN)  IV  600 mg Intravenous Q24H  . heparin subcutaneous  5,000 Units Subcutaneous 3 times per day  . insulin aspart  0-15 Units Subcutaneous 4 times per day  . lip balm  1 application Topical BID  . megestrol  400 mg Oral Daily  . meropenem (MERREM) IV  1 g Intravenous Q8H  . pantoprazole (PROTONIX) IV  40 mg Intravenous Q12H  . sertraline  25 mg Oral Daily   Continuous Infusions: . Marland KitchenTPN (CLINIMIX-E) Adult 83 mL/hr at 12/12/14 1759   And  . fat emulsion 240 mL (12/12/14 1759)  . sodium chloride Stopped (12/10/14 1900)   PRN Meds:.acetaminophen (TYLENOL)  oral liquid 160 mg/5 mL, [DISCONTINUED] acetaminophen **OR** acetaminophen, alum & mag hydroxide-simeth, baclofen, clonazepam, hydrALAZINE, LORazepam, magic mouthwash, menthol-cetylpyridinium, metoprolol, ondansetron **OR** [DISCONTINUED] ondansetron (ZOFRAN) IV, oxyCODONE, phenol, promethazine, sodium chloride, traMADol  Assessment/Plan: Patient Active Problem List   Diagnosis Date Noted  . Pressure ulcer 12/11/2014  . Acute pulmonary edema 12/11/2014  . Fluid collection at surgical site   . Small bowel obstruction   . Hypokalemia 11/25/2014  . Spinal stenosis 11/25/2014  . Essential (primary) hypertension 11/25/2014  . Mixed hyperlipidemia 11/25/2014  . Malnutrition of moderate degree 11/08/2014  . Recurrent SBO (small bowel obstruction) 11/07/2014  . Abscess,  retroperitoneal   . Melena 09/18/2014  . Hyponatremia 09/18/2014  . Hyperglycemia 09/18/2014  . Hypoalbuminemia 09/18/2014  . Anemia of chronic disease 09/18/2014  . Hypertension   . 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  OK from my standpoint to switch to orals.   -continue TPN until better PO intake.   -Switch to wound vac.    LOS: 47 days   [place], [facility]

## 2014-12-13 NOTE — Progress Notes (Signed)
Clinical Social Work  Patient was discussed during progression meeting and RN reports wound vac will be placed when infection on abdomen has cleared. CSW spoke with attending MD re: no LTAC benefits. CSW spoke with [facility] at [facility] in [place] who requested updated information on wound vac. SNF to review information and to contact CSW. CSW updated patient and wife at bedside re: DC plans.  Patient reports he is feeling better and has been compliant with antidepressant medication. Patient reports no emotional distress at this time and is trying to remain focused and positive to start healing quicker.  CSW will continue to follow.  Wever, [place] [phone]

## 2014-12-13 NOTE — Progress Notes (Signed)
Patient ID: Darryl Griffith, male   DOB: Jan 13, 1946, 69 y.o.   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 acetaminophen  650 mg Oral Q6H  . bisacodyl  10 mg Rectal Daily  . DAPTOmycin (CUBICIN)  IV  600 mg Intravenous Q24H  . heparin subcutaneous  5,000 Units Subcutaneous 3 times per day  . insulin aspart  0-15 Units Subcutaneous 4 times per day  . lip balm  1 application Topical BID  . megestrol  400 mg Oral Daily  . meropenem (MERREM) IV  1 g Intravenous Q8H  . pantoprazole (PROTONIX) IV  40 mg Intravenous Q12H  . sertraline  25 mg Oral Daily    Past Medical History  Diagnosis Date  . Hypertension   . Anginal pain     hx of CP saw Dr Wynonia Lawman 09/20/2013 H&P per chart   . Left Retroperitoneal mass s/p resection/L nephrectomy 07/13/2014   . Postoperative anemia due to acute blood loss 07/15/2014  . SBO (small bowel obstruction) 08/01/2014, 10/11/14  . Retroperitoneal fluid collection   . 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 Impaired fasting glucose 11/25/2014  . Benign neoplasm of colon 11/25/2014  . Actinic keratosis 11/25/2014  . Mixed hyperlipidemia 11/25/2014    Social History  Substance Use Topics  . Smoking status: Never Smoker   . Smokeless tobacco: Never Used  . Alcohol Use: No    Family History  Problem Relation Age of  Onset  . Adopted: Yes  . Family history unknown: Yes   Allergies  Allergen Reactions  . Compazine [Prochlorperazine] Other (See Comments)    Made pt really lethargic, and sleepy    SUBJECTIVE: He is feeling better today. He has not had any diarrhea today. He walked in the hallway yesterday with physical therapy. He is advancing his diet. He feels like his pain is under better control.  OBJECTIVE: Filed Vitals:   12/12/14 0551 12/12/14 1700 12/12/14 2130 12/13/14 0436  BP: 151/74 133/85 145/81 152/82  Pulse: 101 120 109 106  Temp: 97.8 F (36.6 C) 98.2 F (36.8 C) 97.7 F (36.5 C) 98.2 F (36.8 C)  TempSrc: Oral Oral Oral Oral  Resp: 40 32 26 24  Height:      Weight:      SpO2: 100% 97% 98% 100%   Body mass index is 24.79 kg/(m^2).  General: He is alert and appears comfortable.  Abdomen: I examined his abdominal midline wound with Maudie Flakes, RN. Here is the description of the wound from her note today: "Measurement:10.5cm x 4cm x 2cm with undermining from 11-1 o'clock measuring 1.5cm Wound bed: 75% yellow, 25% red. Blue sutures visible in wound  bed. Drainage (amount, consistency, odor) Scant serous"   Lab Results Lab Results  Component Value Date   WBC 8.3 12/11/2014   HGB 9.0* 12/11/2014   HCT 27.4* 12/11/2014   MCV 84.3 12/11/2014   PLT 638* 12/11/2014    Lab Results  Component Value Date   CREATININE 1.09 12/11/2014   BUN 32* 12/11/2014   NA 130* 12/11/2014   K 4.8 12/11/2014   CL 101 12/11/2014   CO2 19* 12/11/2014    Lab Results  Component Value Date   ALT 9* 12/11/2014   AST 17 12/11/2014   ALKPHOS 111 12/11/2014   BILITOT 0.4 12/11/2014     Microbiology: No results found for this or any previous visit (from the past 240 hour(s)).   ASSESSMENT: His abdominal wound is open and looks much better than the description of the wound last weekend when he had periwound cellulitis and Prelone drainage. I will go ahead and stop daptomycin now as  he has had 28 days of therapy for VRE in his last culture was negative. I will continue meropenem a little longer and observe how the wound looks. I spoke with his daughter, Barnett Applebaum, today by phone. I do not anticipate that he will need to stay on IV antibiotics after discharge.  PLAN: 1. Continue meropenem for now 2. Discontinue daptomycin 3. I will followup in 48 hours after the [facility] [phone] pager   [phone] cell 12/13/2014, 3:46 PM

## 2014-12-13 NOTE — Consult Note (Addendum)
WOC wound consult note Reason for Consult: In to see wound with Dr. Megan Salon of IP Wound type:Surgical  Pressure Ulcer POA: No Measurement:10.5cm x 4cm x 2cm with undermining from 11-1 o'clock measuring 1.5cm Wound bed: 75% yellow, 25% red.  Blue sutures visible in wound bed. Drainage (amount, consistency, odor) Scant serous Periwound: Resolving periwound erythema, no induration, no fluctuance, no warmth Dressing procedure/placement/frequency: Wound cleansed with NS, patted dry.  White foam (1 piece) is placed as a wound contact layer due to exposed sutures and tucked into undermined area at proximal end.  This is topped with black Granufoam (1 piece) and covered with drape.  Dressing is attached to negative pressure (159mmHg continuous) and an immediate seal is achieved.  Patient tolerated procedure well, was medicated for anxiety immediately following as he felt he could not relax. Next dressing change is scheduled for Friday, 12/15/14. Patient is taught that he should turn from side to side to avoid pressure on the newly noted DTPI (on Monday, 12/11/14).  He states that he does turn, but not "as much as he should be".  Reminded staff and patient that the pressure redistribution chair pad provided for him is to be in his chair whenever he is OOB.  Patient reports that he will be refusing PT today due to anxiety. [place] nursing team will follow, and will remain available to this patient, the nursing, surgical and medical teams.   Thanks, Maudie Flakes, MSN, RN, [place], [place] [phone])

## 2014-12-13 NOTE — Progress Notes (Signed)
PT Cancellation Note  Patient Details Name: Mariah Harn MRN: [id number] DOB: 1945/08/19   Cancelled Treatment:     Pt requested to "skip" PT today after placement of new wound VAC and request to rest/pain meds.  Will check back tomorrow afternoon.   Nathanial Rancher 12/13/2014, 2:28 PM

## 2014-12-14 DIAGNOSIS — E43 Unspecified severe protein-calorie malnutrition: Secondary | ICD-10-CM

## 2014-12-14 DIAGNOSIS — L89152 Pressure ulcer of sacral region, stage 2: Secondary | ICD-10-CM

## 2014-12-14 DIAGNOSIS — K913 Postprocedural intestinal obstruction: Secondary | ICD-10-CM

## 2014-12-14 DIAGNOSIS — D638 Anemia in other chronic diseases classified elsewhere: Secondary | ICD-10-CM

## 2014-12-14 LAB — COMPREHENSIVE METABOLIC PANEL
ALBUMIN: 2.2 g/dL — AB (ref 3.5–5.0)
ALK PHOS: 134 U/L — AB (ref 38–126)
ALT: 9 U/L — AB (ref 17–63)
ANION GAP: 6 (ref 5–15)
AST: 16 U/L (ref 15–41)
BUN: 30 mg/dL — ABNORMAL HIGH (ref 6–20)
CALCIUM: 8.6 mg/dL — AB (ref 8.9–10.3)
CO2: 22 mmol/L (ref 22–32)
Chloride: 107 mmol/L (ref 101–111)
Creatinine, Ser: 0.94 mg/dL (ref 0.61–1.24)
GFR calc Af Amer: >60 mL/min (ref >60)
GFR calc non Af Amer: >60 mL/min (ref >60)
GLUCOSE: 136 mg/dL — AB (ref 65–99)
Potassium: 5 mmol/L (ref 3.5–5.1)
Sodium: 135 mmol/L (ref 135–145)
Total Bilirubin: 0.4 mg/dL (ref 0.3–1.2)
Total Protein: 5.9 g/dL — ABNORMAL LOW (ref 6.5–8.1)

## 2014-12-14 LAB — CBC
HEMATOCRIT: 24.7 % — AB (ref 39.0–52.0)
Hemoglobin: 8 g/dL — ABNORMAL LOW (ref 13.0–17.0)
MCH: 27.4 pg (ref 26.0–34.0)
MCHC: 32.4 g/dL (ref 30.0–36.0)
MCV: 84.6 fL (ref 78.0–100.0)
Platelets: 704 10*3/uL — ABNORMAL HIGH (ref 150–400)
RBC: 2.92 MIL/uL — ABNORMAL LOW (ref 4.22–5.81)
RDW: 20 % — AB (ref 11.5–15.5)
WBC: 10.4 10*3/uL (ref 4.0–10.5)

## 2014-12-14 LAB — GLUCOSE, CAPILLARY
GLUCOSE-CAPILLARY: 125 mg/dL — AB (ref 65–99)
Glucose-Capillary: 134 mg/dL — ABNORMAL HIGH (ref 65–99)
Glucose-Capillary: 126 mg/dL — ABNORMAL HIGH (ref 65–99)

## 2014-12-14 LAB — MAGNESIUM: Magnesium: 2 mg/dL (ref 1.7–2.4)

## 2014-12-14 LAB — PHOSPHORUS: Phosphorus: 4.2 mg/dL (ref 2.5–4.6)

## 2014-12-14 MED ORDER — FAT EMULSION 20 % IV EMUL
240.0000 mL | INTRAVENOUS | Status: AC
  Administered 2014-12-14: 240 mL via INTRAVENOUS
  Filled 2014-12-14: qty 250

## 2014-12-14 MED ORDER — ENSURE ENLIVE PO LIQD
237.0000 mL | Freq: Two times a day (BID) | ORAL | Status: DC
  Administered 2014-12-15 – 2014-12-17 (×4): 237 mL via ORAL

## 2014-12-14 MED ORDER — M.V.I. ADULT IV INJ
INJECTION | INTRAVENOUS | Status: AC
  Administered 2014-12-14: 18:00:00 via INTRAVENOUS
  Filled 2014-12-14: qty 960

## 2014-12-14 NOTE — Progress Notes (Signed)
12/14/14 2330 Nursing Patient refusing cbg at this time. Will recheck cbg in am

## 2014-12-14 NOTE — Progress Notes (Signed)
ANTIBIOTIC CONSULT NOTE - follow up  Pharmacy Consult for merrem  Indication: VRE, Retroperitoneal Abscess  Allergies  Allergen Reactions  . Compazine [Prochlorperazine] Other (See Comments)    Made pt really lethargic, and sleepy    Patient Measurements: Height: 5\' 8"  (172.7 cm) Weight: 163 lb (73.936 kg) IBW/kg (Calculated) : 68.4   Vital Signs: Temp: 98.7 F (37.1 C) (09/15 0624) Temp Source: Oral (09/15 0624) BP: 148/77 mmHg (09/15 0624) Pulse Rate: 102 (09/15 0624) Intake/Output from previous day: 09/14 0701 - 09/15 0700 In: 1434 [P.O.:410; I.V.:80; IV Piggyback:200; NTI:144] Out: 1480 [RXVQM:0867; Drains:5] Intake/Output from this shift: Total I/O In: -  Out: 1200 [Urine:1200]  Labs:  Recent Labs  12/14/14 0520  WBC 10.4  HGB 8.0*  PLT 704*  CREATININE 0.94   Estimated Creatinine Clearance: 70.7 mL/min (by C-G formula based on Cr of 0.94). No results for input(s): VANCOTROUGH, VANCOPEAK, VANCORANDOM, GENTTROUGH, GENTPEAK, GENTRANDOM, TOBRATROUGH, TOBRAPEAK, TOBRARND, AMIKACINPEAK, AMIKACINTROU, AMIKACIN in the last 72 hours.   Microbiology: Recent Results (from the past 720 hour(s))  Culture, routine-abscess     Status: None   Collection Time: 11/27/14 12:22 PM  Result Value Ref Range Status   Specimen Description   Final    ABSCESS ASPIRATION OF RECURRENT LEFT RETROPERITONEAL FLUID. Nephrectomy   Special Requests Normal  Final   Gram Stain   Final    ABUNDANT WBC PRESENT,BOTH PMN AND MONONUCLEAR NO SQUAMOUS EPITHELIAL CELLS SEEN NO ORGANISMS SEEN Performed at [facility]    Culture   Final    NO GROWTH 3 DAYS Performed at [facility]    Report Status 11/30/2014 FINAL  Final  Anaerobic culture     Status: None   Collection Time: 11/27/14 12:22 PM  Result Value Ref Range Status   Specimen Description ABSCESS LEFT RETROPERITONEAL FLUID. Nephrectomy   Final   Special Requests Normal  Final   Gram Stain   Final    ABUNDANT WBC  PRESENT,BOTH PMN AND MONONUCLEAR NO SQUAMOUS EPITHELIAL CELLS SEEN NO ORGANISMS SEEN Performed at [facility]    Culture   Final    NO ANAEROBES ISOLATED Performed at [facility]    Report Status 12/02/2014 FINAL  Final  Fungus culture w smear     Status: None (Preliminary result)   Collection Time: 11/27/14 12:22 PM  Result Value Ref Range Status   Specimen Description ABSCESS LEFT RETROPERITONEAL FLUID/Nephrectomy  Final   Special Requests Normal  Final   Fungal Smear   Final    NO YEAST OR FUNGAL ELEMENTS SEEN Performed at [facility]    Culture   Final    CULTURE IN PROGRESS FOR FOUR WEEKS Performed at [facility]    Report Status PENDING  Incomplete  Surgical pcr screen     Status: None   Collection Time: 11/29/14  6:02 AM  Result Value Ref Range Status   MRSA, PCR NEGATIVE NEGATIVE Final   Staphylococcus aureus NEGATIVE NEGATIVE Final    Comment:        The Xpert SA Assay (FDA approved for NASAL specimens in patients over 70 years of age), is one component of a comprehensive surveillance program.  Test performance has been validated by [facility] for patients greater than or equal to 37 year old. It is not intended to diagnose infection nor to guide or monitor treatment.   Culture, routine-abscess     Status: None   Collection Time: 11/29/14 10:23 AM  Result Value  Ref Range Status   Specimen Description ABSCESS BACK  Final   Special Requests NONE  Final   Gram Stain   Final    RARE WBC PRESENT, PREDOMINANTLY PMN NO SQUAMOUS EPITHELIAL CELLS SEEN NO ORGANISMS SEEN Performed at [facility]    Culture   Final    NO GROWTH 3 DAYS Performed at [facility]    Report Status 12/02/2014 FINAL  Final  Anaerobic culture     Status: None   Collection Time: 11/29/14 10:25 AM  Result Value Ref Range Status   Specimen Description ABSCESS BACK  Final   Special Requests NONE  Final   Gram Stain   Final    RARE  WBC PRESENT, PREDOMINANTLY PMN NO SQUAMOUS EPITHELIAL CELLS SEEN NO ORGANISMS SEEN Performed at [facility]    Culture   Final    NO ANAEROBES ISOLATED Performed at [facility]    Report Status 12/04/2014 FINAL  Final  Body fluid culture     Status: None   Collection Time: 11/29/14 11:42 AM  Result Value Ref Range Status   Specimen Description FLUID PERITONEAL LEFT PERITONEAL FLUID  Final   Special Requests NONE  Final   Gram Stain   Final    FEW WBC PRESENT, PREDOMINANTLY PMN NO ORGANISMS SEEN    Culture   Final    NO GROWTH 3 DAYS Performed at [facility]    Report Status 12/02/2014 FINAL  Final  Anaerobic culture     Status: None   Collection Time: 11/29/14 11:42 AM  Result Value Ref Range Status   Specimen Description FLUID PERITONEAL  Final   Special Requests NONE  Final   Gram Stain   Final    FEW WBC PRESENT, PREDOMINANTLY PMN NO ORGANISMS SEEN    Culture   Final    NO ANAEROBES ISOLATED Performed at [facility]    Report Status 12/04/2014 FINAL  Final    Medical History: Past Medical History  Diagnosis Date  . Hypertension   . Anginal pain     hx of CP saw Dr Wynonia Lawman 09/20/2013 H&P per chart   . Left Retroperitoneal mass s/p resection/L nephrectomy 07/13/2014   . Postoperative anemia due to acute blood loss 07/15/2014  . SBO (small bowel obstruction) 08/01/2014, 10/11/14  . Retroperitoneal fluid collection   . 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 Impaired fasting glucose 11/25/2014  . Benign neoplasm of colon 11/25/2014  . Actinic keratosis 11/25/2014  . 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 He underwent repeat aspiration of  fluid on 8/29. Abx started for VRE abscess.    Significant events:   Purulence noted from abdominal wound on 9/11. Merrem added to broaden coverage.  9/11 CXR: increased BL upper lobe opacities, concerning for edema or PNA  Today, 12/14/2014:  afeb  wbc wnl  scr stable (crcl~70)  PTA Linezolid, Metronidazole since 11/15/14 - changed from zyvox to dapto d/t suspicion for zyvox induced myelosuppression 8/17 >> Linezolid >> 9/4 8/17 >> Flagyl >> 9/11 9/4 >> Daptomycin Rx>> 9/14 9/11 >> merrem Rx>>  - 9/04: CK 20 - 9/12: CK 13  8/12 L retroperitoneal fluid abscess: VRE (S = Linezolid)  8/29 abd fluid abscess: NGF 8/29 anaerobic abd fluid abscess: NGF 8/29 fungus abd fluid abscess: no yeast or fungal elements seen, cx in progress x 4 wks 8/31 peritoneal fluid: NGF 8/31 anaerobic peritoneal fluid: neg FINAL 8/31 back abscess: NGF 8/31 anaerobic back abscess: neg FINAL 8/31 MRSA PCR: negative  Plan:  - continue Merrem 1gm IV q8h - f/u cultures, renal fxn  Dolly Rias RPh 12/14/2014, 10:12 AM Pager [phone]

## 2014-12-14 NOTE — Progress Notes (Addendum)
Nutrition Follow-up  DOCUMENTATION CODES:   Non-severe (moderate) malnutrition in context of chronic illness  INTERVENTION:  - Continue TPN/TPN wean per pharmacy - Continue Regular diet - Will order Ensure Enlive BID, each supplement provides 350 kcal and 20 grams of protein - RD will continue to monitor for needs  NUTRITION DIAGNOSIS:   Inadequate oral intake related to inability to eat as evidenced by NPO status. -resolving with diet advancement and improving intakes  GOAL:   Patient will meet greater than or equal to 90% of their needs -met on average with TPN regimen and PO intakes  MONITOR:   PO intake, Supplement acceptance, Weight trends, Labs, I & O's, Other (Comment) (TPN regimen)  ASSESSMENT:   69 y.o. male with a history of Left Flank Area Liposarcoma S/P Resection and Left Nephrectomy 06/2014 with Radiation Rx completed on 16/1096 complicated by abscess formation (+VRE) who presents to the ED with complaints of ABD bloating x 2 days and nausea and vomiting x 4 today. He denies any hematemesis, fevers or chills. A Ct scan was performed 71 da20 y.o. male with a history of Left Flank Area Liposarcoma S/P Resection and Left Nephrectomy 06/2014 with Radiation Rx completed on 06/5407 complicated by abscess formation (+VRE) who presents to the ED with complaints of ABD bloating x 2 days and nausea and vomiting x 4 today. He denies any hematemesis, fevers or chills. A Ct scan was performed 1 day ago for cancer staging and revealed a high grade partial small bowl obstruction and General surgery was consulted and an NGT was placed. He was referred for medical admission. He is on oral Linezolid and Metronidazole for +VRE For a chronic intra-abdominal abscess. y ago for cancer staging and revealed a high grade partial small bowl obstruction and General surgery was consulted and an NGT was placed. He was referred for medical admission. He is on oral Linezolid and  Metronidazole for +VRE For a chronic intra-abdominal abscess.   9/14 Diet advancement as follows: 9/13 @ 0900: FLD 9/14 @ 1228: Soft diet  9/15 @ 0811: Regular diet  Unable to talk with pt x2 attempted visits. New order for kcal count and envelope hung on the door. RD to follow up with results.  Clinimix E 5/20 decreased from 93 mL/hr to 83 mL/hr with 20% lipids @ 10 mL/hr on 9/13 and plan to further decrease to 40 mL/hr today. Clinimix E 5/20 @ 40 mL/hr with 20% lipids @ 10 mL/hr will provide 48 grams protein (44% minimum needs), 1325 kcal (58% minimum needs).  Per chart review, pt ate 10% breakfast, 75% lunch, and 20% dinner on Soft diet yesterday and 20% breakfast and 85% lunch 9/13 on FLD. Will order Ensure Enlive to supplement PO and assist with continued TPN wean.  Per pharmacy note today:  At 1800 today:  decrease Clinimix E 5/20 to 40 ml/hr.  20% fat emulsion at 10 ml/hr.  TPN to contain standard multivitamins and trace elements.  Continue [facility] at [facility].  Continue moderate SSI q6h.  TPN lab panels on Mondays & Thursdays.  Advancing diet/ Encourage PO intake, Megace started  Medications reviewed; Megace order in place. Labs reviewed; CBGs: 115-163 mg/dL, BUN elevated, Ca: 8.6 mg/dL.    9/12 - POD # 12. NGT now removed and diet advanced to CLD 9/8 @ 1105.  - Per chart review, pt consumed 35% breakfast and 20% lunch yesterday.  - Pt denies having anything to eat/drink this AM and states pain to abdominal area. -  Per MD notes, place to continue current TPN order until diet is further advanced. Pt currently receiving Clinimix E 5/20 @ 93 with 20% lipids @ 10 mL/hr. This regimen is providing 2444 kcal, 112 grams protein which is meeting needs.  9/7 - POD #7; pt continues with NGT with plan, per surgery, to trial clamping today and allow ice chips.  - Pt has been having flatus and had BM today. - TPN was adjusted at 1800 on 9/2 and that regimen continues: Clinimix E  5/20 @ 93 with 20% lipids @ 10 mL/hr. This regimen is providing 2444 kcal, 112 grams protein which is meeting needs.  Per pharmacy note this AM: At 1800 today:  Continue Clinimix E 5/20 at goal rate of 93 ml/hr.  20% fat emulsion at 10 ml/hr.  TPN to contain standard multivitamins and trace elements.  Continue IVF at [facility].  Continue sensitive SSI q6h.  TPN lab panels on Mondays & Thursdays.  *See previous RD follow-up notes for information earlier in admission.     Diet Order:  TPN (CLINIMIX-E) Adult Diet regular Room service appropriate?: Yes; Fluid consistency:: Thin TPN (CLINIMIX-E) Adult  Skin:  Wound (see comment) (Stage 2 sacral pressure ulcer, abdominal incision)  Last BM:  9/13  Height:   Ht Readings from Last 1 Encounters:  11/24/14 5' 8"  (1.727 m)    Weight:   Wt Readings from Last 1 Encounters:  11/24/14 163 lb (73.936 kg)    Ideal Body Weight:  70 kg (kg)  BMI:  Body mass index is 24.79 kg/(m^2).  Estimated Nutritional Needs:   Kcal:  8102-5486  Protein:  110-120 grams  Fluid:  2.2-2.5 L/day  EDUCATION NEEDS:   No education needs identified at this time     Jarome Matin, RD, LDN Inpatient Clinical Dietitian Pager # [phone] After hours/weekend pager # [phone]

## 2014-12-14 NOTE — Progress Notes (Addendum)
Clinical Social Work  Patient was discussed during progression meeting. CSW called [facility] in [place] and spoke with [facility] who reports that facility will review information. Per MD, patient might be ready to DC over the weekend. SNF reports they can offer weekend admissions and will call CSW back in the morning with a response.  [place], [place] [phone]

## 2014-12-14 NOTE — Progress Notes (Signed)
PARENTERAL NUTRITION CONSULT NOTE - Follow-Up  Pharmacy Consult for TPN Indication: SBO  Allergies  Allergen Reactions  . Compazine [Prochlorperazine] Other (See Comments)    Made pt really lethargic, and sleepy   Patient Measurements: Height: 5' 8"  (172.7 cm) Weight: 163 lb (73.936 kg) IBW/kg (Calculated) : 68.4  Vital Signs: Temp: 98.7 F (37.1 C) (09/15 0624) Temp Source: Oral (09/15 0624) BP: 148/77 mmHg (09/15 0624) Pulse Rate: 102 (09/15 0624) Intake/Output from previous day: 09/14 0701 - 09/15 0700 In: 1434 [P.O.:410; I.V.:80; IV Piggyback:200; KKX:381] Out: 1480 [WEXHB:7169; Drains:5] Intake/Output from this shift: Total I/O In: -  Out: 1200 [Urine:1200]  Labs:  Recent Labs  12/14/14 0520  WBC 10.4  HGB 8.0*  HCT 24.7*  PLT 704*    Recent Labs  12/14/14 0520  NA 135  K 5.0  CL 107  CO2 22  GLUCOSE 136*  BUN 30*  CREATININE 0.94  CALCIUM 8.6*  MG 2.0  PHOS 4.2  PROT 5.9*  ALBUMIN 2.2*  AST 16  ALT 9*  ALKPHOS 134*  BILITOT 0.4   Estimated Creatinine Clearance: 70.7 mL/min (by C-G formula based on Cr of 0.94).    Recent Labs  12/13/14 1304 12/13/14 1759 12/14/14 0623  GLUCAP 151* 115* 134*    Medical History: Past Medical History  Diagnosis Date  . Hypertension   . Anginal pain     hx of CP saw Dr Wynonia Lawman 09/20/2013 H&P per chart   . Left Retroperitoneal mass s/p resection/L nephrectomy 07/13/2014   . Postoperative anemia due to acute blood loss 07/15/2014  . SBO (small bowel obstruction) 08/01/2014, 10/11/14  . Retroperitoneal fluid collection   . 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 Impaired fasting glucose 11/25/2014  . Benign neoplasm of colon 11/25/2014  . Actinic keratosis 11/25/2014  . Mixed hyperlipidemia 11/25/2014    Insulin Requirements: 5 units over the past 24 hours (on sensitive SSI q6h) - no hx DM  Current Nutrition: soft diet    IVF: NS @ [facility] access: PICC placed 10/12/14 TPN start date: 8/28  ASSESSMENT                                                                                                          HPI:  28 yoM s/p resection L retroperitoneal sarcoma, L nephrectomy 07/13/14 developed VRE abscess. Hx abd bloating, N/V 2 days PTA, SBO seen on CT for Ca restaging.  Hx recurrent SBO. Begin TPN per Pharmacy  Significant events:   8/29: plan for exp laparotomy on 8/31; s/p aspiration of L retroperitoneal fluid collection on 8/29 8/31:  s/p exp lap -->  I&D L back abscess, lysis of adhesions, small bowel resection, placement og drain into L retroperitoneal abscess 9/2: bloody NGT output, PPI IV BID added;  change clinimix E 5/15 to E 5/20 9/7: NGT clamp study- minimal residual and no n/v 9/8: Clear liquid diet ordered  9/9: NGT removed 9/14: pt tolerating liquid diet but just not  interested in eating 9/15: diet advanced to regular, begin weaning TPN  Today:   Glucose (goal <150):   Electrolytes - WNL, CoCa WNL   Renal - WNL   LFTs - AST/ALT low, Alk Phos increased, Tbili WNL   TGs - 123 (9/12)  Prealbumin - 6.1 (9/5) -->6.0 (9/12)  NUTRITIONAL GOALS                                                                                             RD recs:   Kcal: 2300-2500  Protein: 110-120 grams  Fluid: 2.2-2.5 L/day  Clinimix E 5/20 at a rate of 83 ml/hr + 20% fat emulsion at 10 ml/hr to provide: 100 g/day protein, 2233 Kcal/day (>90% nutritional goals per dietician recommendations)   PLAN                                                                                                                         1) At 1800 today:  decrease Clinimix E 5/20 to 40 ml/hr.  20% fat emulsion at 10 ml/hr.  TPN to contain standard multivitamins and trace elements.  Continue IVF at [facility].  Continue  moderate SSI q6h.  TPN lab panels on Mondays & Thursdays.  Advancing diet/ Encourage PO  intake, megace started  F/u daily.  Dolly Rias RPh 12/14/2014, 10:14 AM Pager [phone]

## 2014-12-14 NOTE — Progress Notes (Signed)
Patient ID: Darryl Griffith, male   DOB: 1945/04/19, 69 y.o.   MRN: [id number]  Progress Note: General Surgery Service   Subjective: Trying more food.    Objective: Vital signs in last 24 hours: Temp:  [98.7 F (37.1 C)-98.8 F (37.1 C)] 98.7 F (37.1 C) (09/15 0624) Pulse Rate:  [102-114] 102 (09/15 0624) Resp:  [24-28] 24 (09/15 0624) BP: (142-148)/(75-79) 148/77 mmHg (09/15 0624) SpO2:  [99 %-100 %] 100 % (09/15 0624) Last BM Date: 12/12/14  Intake/Output from previous day: 09/14 0701 - 09/15 0700 In: 1434 [P.O.:410; I.V.:80; IV Piggyback:200; TPN:744] Out: 1480 [NWGNF:6213; Drains:5] Intake/Output this shift: Total I/O In: -  Out: 1200 [Urine:1200]   Lungs: breathing comfortably CV: tachycardic Abd: soft, non distended, approp tender.  Vac in place.   Extremities: trace edema Neuro: alert, oriented to self, place and event   Lab Results: CBC   Recent Labs  12/14/14 0520  WBC 10.4  HGB 8.0*  HCT 24.7*  PLT 704*   BMET  Recent Labs  12/14/14 0520  NA 135  K 5.0  CL 107  CO2 22  GLUCOSE 136*  BUN 30*  CREATININE 0.94  CALCIUM 8.6*   PT/INR No results for input(s): LABPROT, INR in the last 72 hours. ABG No results for input(s): PHART, HCO3 in the last 72 hours.  Invalid input(s): PCO2, PO2  Studies/Results: No results found.  Anti-infectives: Anti-infectives    Start     Dose/Rate Route Frequency Ordered Stop   12/10/14 1800  meropenem (MERREM) 1 g in sodium chloride 0.9 % 100 mL IVPB     1 g 200 mL/hr over 30 Minutes Intravenous Every 8 hours 12/10/14 1741     12/03/14 1300  DAPTOmycin (CUBICIN) 600 mg in sodium chloride 0.9 % IVPB  Status:  Discontinued     600 mg 224 mL/hr over 30 Minutes Intravenous Every 24 hours 12/03/14 1157 12/13/14 1554   11/29/14 0830  [MAR Hold]  ceFAZolin (ANCEF) IVPB 2 g/50 mL premix     (MAR Hold since 11/29/14 0825)   2 g 100 mL/hr over 30 Minutes Intravenous On call to O.R. 11/28/14 0850 11/29/14 0908   11/25/14 0000  metroNIDAZOLE (FLAGYL) IVPB 500 mg  Status:  Discontinued     500 mg 100 mL/hr over 60 Minutes Intravenous Every 8 hours 11/24/14 2355 12/10/14 1729   11/25/14 0000  linezolid (ZYVOX) IVPB 600 mg  Status:  Discontinued     600 mg 300 mL/hr over 60 Minutes Intravenous Every 12 hours 11/24/14 2355 12/03/14 1145      Medicaions: Scheduled Meds: . acetaminophen  650 mg Oral Q6H  . bisacodyl  10 mg Rectal Daily  . heparin subcutaneous  5,000 Units Subcutaneous 3 times per day  . insulin aspart  0-15 Units Subcutaneous 4 times per day  . lip balm  1 application Topical BID  . megestrol  400 mg Oral Daily  . meropenem (MERREM) IV  1 g Intravenous Q8H  . pantoprazole (PROTONIX) IV  40 mg Intravenous Q12H  . sertraline  25 mg Oral Daily   Continuous Infusions: . sodium chloride Stopped (12/10/14 1900)  . Marland KitchenTPN (CLINIMIX-E) Adult 83 mL/hr at 12/13/14 1747   And  . fat emulsion 240 mL (12/13/14 1747)   PRN Meds:.acetaminophen (TYLENOL) oral liquid 160 mg/5 mL, [DISCONTINUED] acetaminophen **OR** acetaminophen, alum & mag hydroxide-simeth, baclofen, clonazepam, hydrALAZINE, LORazepam, magic mouthwash, menthol-cetylpyridinium, metoprolol, ondansetron **OR** [DISCONTINUED] ondansetron (ZOFRAN) IV, oxyCODONE, phenol, promethazine, sodium chloride, traMADol  Assessment/Plan:  Patient Active Problem List   Diagnosis Date Noted  . Hypomagnesemia 12/13/2014  . Failure to thrive in adult 12/13/2014  . Protein-calorie malnutrition, severe 12/13/2014  . Depression 12/13/2014  . Neutropenia, drug-induced 12/13/2014  . Postoperative ileus 12/13/2014  . Postoperative anemia due to acute blood loss 12/13/2014  . Benign essential HTN 12/13/2014  . Ulcer of sacral region, stage 2 12/13/2014  . Malnutrition of moderate degree 12/13/2014  . Acute pulmonary edema 12/11/2014  . Fluid collection at surgical site   . Small bowel obstruction   . Hypokalemia 11/25/2014  . Essential (primary)  hypertension 11/25/2014  . Recurrent SBO (small bowel obstruction) 11/07/2014  . Abscess, retroperitoneal   . Hyponatremia 09/18/2014  . 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  Calorie counts. Advance to regular diet.  -Switch to wound vac.    LOS: 20 days   Stark Klein, [facility]

## 2014-12-14 NOTE — Care Management Important Message (Signed)
Important Message  Patient Details  Name: Jeston Junkins MRN: [id number] Date of Birth: 07-20-1945   Medicare Important Message Given:  Yes-third notification given    Camillo Flaming 12/14/2014, 12:48 [place] Message  Patient Details  Name: Gabrial Domine MRN: [id number] Date of Birth: 05-05-1945   Medicare Important Message Given:  Yes-third notification given    Camillo Flaming 12/14/2014, 12:48 PM

## 2014-12-14 NOTE — Progress Notes (Signed)
Patient ID: Darryl Griffith, male   DOB: 1945/08/19, 69 y.o.   MRN: [id number] [facility] PROGRESS NOTE  Jacoby Zanni HKV:[id number] DOB: November 02, 1945 DOA: 11/24/2014 PCP: Gennette Pac, MD  Brief narrative:    69 y.o. male with past medical history of left flank liposarcoma S/P Resection and left nephrectomy 06/2014, status post radiation treatment completed 56/4332 complicated by an abscess formation (+VRE), s/p drain placement for ~ 3 weeks (removed 10/31/14). Pt was admitted on 11/24/14 with nausea and vomiting and was found to have SBO. General surgery was consulted on admission. Patient did not improve with conservative measures and underwent surgery 11/29/14: incision and drainage of left back abscess, exploratory laparotomy, lysis of adhesions, small bowel resection and placement of drain into left retroperitoneal abscess. Wound vac placed. The pt requires TNA for nutritional support but hopefully will stop it once PO intake better.    Anticipated discharge: discharge once changed to PO abx.  Assessment/Plan:    Principal problem:  SBO status post resection with lysis of adhesions / postoperative ileus / left retroperitoneal abscess  - Pt presented with SBO. - Pt is s/p surgical repair on 8/31 with exploratory laparotomy, lysis of adhesions, small bowel resection, placement of drain into left retroperitoneal abscess.  - Wound opened 12/10/14. - Wound vac placed. - Wound care assessment done 12/13/2014. Measurement:10.5cm x 4cm x 2cm with undermining from 11-1 o'clock measuring 1.5cm. Dressing recommendations: Wound cleansed with NS, patted dry. White foam (1 piece) placed as a wound contact layer due to exposed sutures. This was topped with black Granufoam (1 piece) and covered with drape. Dressing is attached to negative pressure (134mmHg continuous) and an immediate seal was achieved. Next dressing change is scheduled for Friday, 12/15/14. - Continue TNA for nutritional support  while PO intake improving. Hopefully will be stopped in next day or so.  - ID to recommend PO abx on discharge.   Active problems:  Acute pulmonary edema - BNP only mildly elevated at 103. Has received lasix - Stable respiratory status.   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   Left retroperitoneal and left upper quadrant fluid collection  - Cultures initially positive for VRE. Cultures from 11/29/14 negative. - Status post IR drainage 11/27/14. - Currently on day 12 meropenem - Appreciate ID recommendations for abx on discharge.   Sacral pressure ulcer, stage 2   - Partial thickness loss of dermis with shallow open ulcer with red, pink wound bed without slough, red to purple over entire sacrum, two areas approximately 10 x 5 cm each on both buttocks - Continue therapeutic mattress and pressure redistribution   Depression - Due to prolonged illness  - Psychiatry consult requested. - Continue Zoloft 25 mg daily   Essential hypertension  - BP controlled   Hypokalemia / Hypomagnesemia - Likely secondary to GI losses - Supplemented - Potassium and magnesium WNL   Symptomatic Anemia / Acute postoperative blood loss anemia  - Has received 2 units of PRBCs 11/27/14, 1 unit 12/01/14, 1 unit 12/05/14 and 1 unit 12/08/14. - Hemoglobin 8 on 12/14/2014. No reports of bleeding   Hyponatremia - Due to GI losses, dehydration - Improved with hydration    Neutropenia, drug induced  - Secondary to Vioxx  - WBC count subsequently normalized   Failure to thrive in adult / Severe protein calorie malnutrition - In the context of prolonged illness - Continue megace  - Continue  nutritional supplementation   DVT prophylaxis - Heparin subQ ordered in hospital    Code Status: Full Code Family Communication: Family not at the bedside this am; updated wife [name] over  the phone [phone] and [phone] Disposition Plan: Discharge once pt is taking oral abx    IV access:  PICC line placed 10/12/14  Procedures and diagnostic studies in past 72 hours:    Dg Chest Port 1 View 12/10/2014    Increased bilateral upper lobe opacities are noted concerning for edema or possibly pneumonia. Increased mild left pleural effusion is noted.     Medical Consultants:  Infectious Disease: Carlyle Basques, MD General Surgery: Alphonsa Overall, MD  Other Consultants:  Nutrition Physical therapy Social work [place]  IAnti-Infectives:   Total days of antibiotics 63 Day 11 daptomycin. Stopped dapto today 9/14. Day 6 meropenem    Leisa Lenz, MD  [facility] Pager [phone]  Time spent in minutes: 25 minutes  If 7PM-7AM, please contact night-coverage [email or web address] Password [email or web address] 12/14/2014, 1:55 PM   LOS: 20 days    HPI/Subjective: No acute overnight events. Patient reports feeling better.   Objective: Filed Vitals:   12/13/14 0436 12/13/14 1816 12/13/14 2154 12/14/14 0624  BP: 152/82 142/75 142/79 148/77  Pulse: 106 114 107 102  Temp: 98.2 F (36.8 C) 98.8 F (37.1 C) 98.7 F (37.1 C) 98.7 F (37.1 C)  TempSrc: Oral Oral Oral Oral  Resp: 24 24 28 24   Height:      Weight:      SpO2: 100% 99% 100% 100%    Intake/Output Summary (Last 24 hours) at 12/14/14 1355 Last data filed at 12/14/14 1000  Gross per 24 hour  Intake   1756 ml  Output   3035 ml  Net  -1279 ml    Exam:   General:  Pt is alert, no distress  Cardiovascular: RRR, S1/S2 (+)  Respiratory: no wheezing, no rhonchi   Abdomen: non tender and non distended abdomen, (+) BS, wound looks clean with wound vac attached   Extremities: trace edema, pulses palpable bilaterally  Neuro: No focal deficits   Data Reviewed: Basic Metabolic Panel:  Recent Labs Lab 12/10/14 1701 12/11/14 0610 12/14/14 0520  NA 130* 130* 135  K 5.1 4.8 5.0  CL 105 101 107  CO2 20* 19* 22   GLUCOSE 154* 148* 136*  BUN 30* 32* 30*  CREATININE 0.99 1.09 0.94  CALCIUM 7.7* 8.2* 8.6*  MG  --  1.9 2.0  PHOS  --  3.9 4.2   Liver Function Tests:  Recent Labs Lab 12/11/14 0610 12/14/14 0520  AST 17 16  ALT 9* 9*  ALKPHOS 111 134*  BILITOT 0.4 0.4  PROT 5.9* 5.9*  ALBUMIN 2.5* 2.2*   No results for input(s): LIPASE, AMYLASE in the last 168 hours. No results for input(s): AMMONIA in the last 168 hours. CBC:  Recent Labs Lab 12/08/14 0520 12/10/14 1701 12/11/14 0610 12/14/14 0520  WBC 3.6* 7.1 8.3 10.4  NEUTROABS 1.4*  --  6.3  --   HGB 7.2* 8.3* 9.0* 8.0*  HCT 22.0* 25.3* 27.4* 24.7*  MCV 84.0 84.9 84.3 84.6  PLT 430* 558* 638* 704*   Cardiac Enzymes:  Recent Labs Lab 12/11/14 0610  CKTOTAL 13*   BNP: Invalid input(s): POCBNP CBG:  Recent Labs Lab 12/13/14 0411 12/13/14 1304 12/13/14 1759 12/14/14 0623 12/14/14 1250  GLUCAP 135* 151* 115* 134* 126*    No results found for this or any previous visit (from the  past 240 hour(s)).   Scheduled Meds: . acetaminophen  650 mg Oral Q6H  . bisacodyl  10 mg Rectal Daily  . heparin subcutaneous  5,000 Units Subcutaneous 3 times per day  . insulin aspart  0-15 Units Subcutaneous 4 times per day  . megestrol  400 mg Oral Daily  . meropenem (MERREM) IV  1 g Intravenous Q8H  . pantoprazole (PROTONIX) IV  40 mg Intravenous Q12H  . sertraline  25 mg Oral Daily   Continuous Infusions: . sodium chloride 10 mL/hr at 12/14/14 1116  . Marland KitchenTPN (CLINIMIX-E) Adult 83 mL/hr at 12/13/14 1747   And  . fat emulsion 240 mL (12/13/14 1747)  . Marland KitchenTPN (CLINIMIX-E) Adult     And  . fat emulsion

## 2014-12-14 NOTE — Progress Notes (Signed)
Physical Therapy Treatment Patient Details Name: Darryl Griffith MRN: [id number] DOB: Aug 03, 1945 Today's Date: 12/14/2014    History of Present Illness Darryl Griffith is a 69 y.o. male with a history of Left Flank Area Liposarcoma S/P Resection and Left Nephrectomy 06/2014 with Radiation Rx completed on 38/7564 complicated by abscess formation (+VRE) who presents to the ED 11/24/14  with complaints of ABD bloating x 2 days and nausea and vomiting x 4 today. He denies any hematemesis, fevers or chills. A Ct scan was performed 1 day ago for cancer staging and revealed a high grade partial small bowl obstruction.  on 11/29/14 Incision and drainage left back abscess, exploratory laparotomy, lysis of adhesions, small bowel resection, placement of drain into left retroperitoneal abscess     PT Comments    Pt progressing slowly.  Assisted OOB to amb a greater distance but requires increased time and assist for safety multiple lines/tubes.  Still using EVA walker (B platform walker) to increase amb distance/tolerance and decrease pt's anxiety level.      Follow Up Recommendations  SNF;Home health PT (pending progress and assist level/support at home.)  most likely will need SNF   Equipment Recommendations  Rolling walker with 5" wheels    Recommendations for Other Services       Precautions / Restrictions Precautions Precautions: Fall Precaution Comments: monitor HR, L abd JP, woumd VAC Restrictions Weight Bearing Restrictions: No    Mobility  Bed Mobility Overal bed mobility: Needs Assistance Bed Mobility: Supine to Sit;Sit to Supine Rolling: Min assist Sidelying to sit: Min assist Supine to sit: Mod assist     General bed mobility comments: instructed on "log Roll" tech to decrease ABD stress/pain.  better able to self perform.  Transfers Overall transfer level: Needs assistance Equipment used: None Transfers: Sit to/from Stand Sit to Stand: Min guard         General  transfer comment: assist from elevated bed pt better able to self perform.    Ambulation/Gait Ambulation/Gait assistance: Min guard Ambulation Distance (Feet): 160 Feet Assistive device: Bilateral platform walker Gait Pattern/deviations: Step-through pattern;Decreased stride length;Trunk flexed Gait velocity: decreased   General Gait Details: used B platform EVA walker due to extended LOS and overall weakness due to medical.  also to decrease pt's anxiety/fear of buckling.    Stairs            Wheelchair Mobility    Modified Rankin (Stroke Patients Only)       Balance                                    Cognition Arousal/Alertness: Awake/alert Behavior During Therapy: WFL for tasks assessed/performed Overall Cognitive Status: Within Functional Limits for tasks assessed                      Exercises      General Comments        Pertinent Vitals/Pain Pain Assessment: 0-10 Pain Score: 3  Pain Descriptors / Indicators: Sore;Tender Pain Intervention(s): Monitored during session;Repositioned    Home Living                      Prior Function            PT Goals (current goals can now be found in the care plan section) Progress towards PT goals: Progressing toward goals    Frequency  Min  3X/week    PT Plan Current plan remains appropriate    Co-evaluation             End of Session Equipment Utilized During Treatment: Gait belt Activity Tolerance: Patient tolerated treatment well Patient left: in bed;with call bell/phone within reach     Time: 1345-1420 PT Time Calculation (min) (ACUTE ONLY): 35 min  Charges:  $Gait Training: 8-22 mins $Therapeutic Activity: 8-22 mins                    G Codes:      Rica Koyanagi  PTA [facility]  Acute  Rehab Pager      [phone]

## 2014-12-15 DIAGNOSIS — R634 Abnormal weight loss: Secondary | ICD-10-CM

## 2014-12-15 DIAGNOSIS — R5383 Other fatigue: Secondary | ICD-10-CM

## 2014-12-15 DIAGNOSIS — E440 Moderate protein-calorie malnutrition: Secondary | ICD-10-CM

## 2014-12-15 LAB — BASIC METABOLIC PANEL
ANION GAP: 6 (ref 5–15)
BUN: 29 mg/dL — AB (ref 6–20)
CALCIUM: 8.7 mg/dL — AB (ref 8.9–10.3)
CO2: 23 mmol/L (ref 22–32)
Chloride: 105 mmol/L (ref 101–111)
Creatinine, Ser: 1.06 mg/dL (ref 0.61–1.24)
GFR calc Af Amer: >60 mL/min (ref >60)
GLUCOSE: 117 mg/dL — AB (ref 65–99)
Potassium: 4.8 mmol/L (ref 3.5–5.1)
Sodium: 134 mmol/L — ABNORMAL LOW (ref 135–145)

## 2014-12-15 LAB — GLUCOSE, CAPILLARY
GLUCOSE-CAPILLARY: 109 mg/dL — AB (ref 65–99)
GLUCOSE-CAPILLARY: 138 mg/dL — AB (ref 65–99)
Glucose-Capillary: 131 mg/dL — ABNORMAL HIGH (ref 65–99)

## 2014-12-15 LAB — CBC
HEMATOCRIT: 24.3 % — AB (ref 39.0–52.0)
HEMOGLOBIN: 7.8 g/dL — AB (ref 13.0–17.0)
MCH: 27.2 pg (ref 26.0–34.0)
MCHC: 32.1 g/dL (ref 30.0–36.0)
MCV: 84.7 fL (ref 78.0–100.0)
Platelets: 811 10*3/uL — ABNORMAL HIGH (ref 150–400)
RBC: 2.87 MIL/uL — ABNORMAL LOW (ref 4.22–5.81)
RDW: 20 % — AB (ref 11.5–15.5)
WBC: 11.6 10*3/uL — ABNORMAL HIGH (ref 4.0–10.5)

## 2014-12-15 MED ORDER — CETYLPYRIDINIUM CHLORIDE 0.05 % MT LIQD
7.0000 mL | Freq: Two times a day (BID) | OROMUCOSAL | Status: DC
  Administered 2014-12-15 – 2014-12-17 (×4): 7 mL via OROMUCOSAL

## 2014-12-15 MED ORDER — TRACE MINERALS CR-CU-MN-SE-ZN 10-1000-500-60 MCG/ML IV SOLN
INTRAVENOUS | Status: AC
  Administered 2014-12-15: 17:00:00 via INTRAVENOUS
  Filled 2014-12-15: qty 960

## 2014-12-15 MED ORDER — METRONIDAZOLE 500 MG PO TABS
500.0000 mg | ORAL_TABLET | Freq: Four times a day (QID) | ORAL | Status: DC
  Administered 2014-12-15 – 2014-12-17 (×8): 500 mg via ORAL
  Filled 2014-12-15 (×11): qty 1

## 2014-12-15 MED ORDER — FAT EMULSION 20 % IV EMUL
240.0000 mL | INTRAVENOUS | Status: AC
  Administered 2014-12-15: 240 mL via INTRAVENOUS
  Filled 2014-12-15: qty 240

## 2014-12-15 MED ORDER — LEVOFLOXACIN 500 MG PO TABS
500.0000 mg | ORAL_TABLET | Freq: Every day | ORAL | Status: DC
  Administered 2014-12-15 – 2014-12-17 (×3): 500 mg via ORAL
  Filled 2014-12-15 (×3): qty 1

## 2014-12-15 MED ORDER — ALTEPLASE 2 MG IJ SOLR
2.0000 mg | Freq: Once | INTRAMUSCULAR | Status: AC
Start: 2014-12-15 — End: 2014-12-15
  Administered 2014-12-15: 2 mg
  Filled 2014-12-15: qty 2

## 2014-12-15 NOTE — Progress Notes (Signed)
PARENTERAL NUTRITION CONSULT NOTE - Follow-Up  Pharmacy Consult for TPN Indication: SBO  Allergies  Allergen Reactions  . Compazine [Prochlorperazine] Other (See Comments)    Made pt really lethargic, and sleepy   Patient Measurements: Height: 5' 8"  (172.7 cm) Weight: 163 lb (73.936 kg) IBW/kg (Calculated) : 68.4  Vital Signs: Temp: 98.7 F (37.1 C) (09/16 0545) Temp Source: Oral (09/16 0545) BP: 151/77 mmHg (09/16 0545) Pulse Rate: 104 (09/16 0545) Intake/Output from previous day: 09/15 0701 - 09/16 0700 In: 2876.4 [P.O.:720; I.V.:145.3; IV Piggyback:300; TPN:1711.1] Out: 4106 [Urine:4100; Drains:5; Stool:1] Intake/Output from this shift: Total I/O In: -  Out: 1602 [Urine:1600; Stool:2]  Labs:  Recent Labs  12/14/14 0520 12/15/14 0620  WBC 10.4 11.6*  HGB 8.0* 7.8*  HCT 24.7* 24.3*  PLT 704* 811*    Recent Labs  12/14/14 0520 12/15/14 0620  NA 135 134*  K 5.0 4.8  CL 107 105  CO2 22 23  GLUCOSE 136* 117*  BUN 30* 29*  CREATININE 0.94 1.06  CALCIUM 8.6* 8.7*  MG 2.0  --   PHOS 4.2  --   PROT 5.9*  --   ALBUMIN 2.2*  --   AST 16  --   ALT 9*  --   ALKPHOS 134*  --   BILITOT 0.4  --    Estimated Creatinine Clearance: 62.7 mL/min (by C-G formula based on Cr of 1.06).    Recent Labs  12/14/14 1802 12/15/14 0543 12/15/14 1124  GLUCAP 125* 109* 131*    Medical History: Past Medical History  Diagnosis Date  . Hypertension   . Anginal pain     hx of CP saw Dr Wynonia Lawman 09/20/2013 H&P per chart   . Left Retroperitoneal mass s/p resection/L nephrectomy 07/13/2014   . Postoperative anemia due to acute blood loss 07/15/2014  . SBO (small bowel obstruction) 08/01/2014, 10/11/14  . Retroperitoneal fluid collection   . 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 Impaired fasting glucose 11/25/2014  . Benign neoplasm of colon 11/25/2014  . Actinic keratosis 11/25/2014  . Mixed  hyperlipidemia 11/25/2014    Insulin Requirements: 5 units over the past 24 hours (on sensitive SSI q6h) - no hx DM  Current Nutrition: regular diet   IVF: NS @ [facility] access: PICC placed 10/12/14 TPN start date: 8/28  ASSESSMENT                                                                                                          HPI:  27 yoM s/p resection L retroperitoneal sarcoma, L nephrectomy 07/13/14 developed VRE abscess. Hx abd bloating, N/V 2 days PTA, SBO seen on CT for Ca restaging.  Hx recurrent SBO. Begin TPN per Pharmacy  Significant events:   8/29: plan for exp laparotomy on 8/31; s/p aspiration of L retroperitoneal fluid collection on 8/29 8/31:  s/p exp lap -->  I&D L back abscess, lysis of adhesions, small bowel resection, placement og drain into L retroperitoneal abscess  9/2: bloody NGT output, PPI IV BID added;  change clinimix E 5/15 to E 5/20 9/7: NGT clamp study- minimal residual and no n/v 9/8: Clear liquid diet ordered  9/9: NGT removed 9/14: pt tolerating liquid diet but just not interested in eating 9/15: diet advanced to regular, begin weaning TPN  Today:   Glucose (goal <150):   Electrolytes - NA 134, all others WNL, CoCa WNL   Renal - WNL   LFTs - AST/ALT low, Alk Phos increased, Tbili WNL   TGs - 123 (9/12)  Prealbumin - 6.1 (9/5) -->6.0 (9/12)  NUTRITIONAL GOALS                                                                                             RD recs:   Kcal: 2300-2500  Protein: 110-120 grams  Fluid: 2.2-2.5 L/day  Clinimix E 5/20 at a rate of 83 ml/hr + 20% fat emulsion at 10 ml/hr to provide: 100 g/day protein, 2233 Kcal/day (>90% nutritional goals per dietician recommendations)   PLAN                                                                                                                         1) At 1800 today:  Continue Clinimix E 5/20 at 40 ml/hr.  20% fat emulsion at 10 ml/hr.  TPN to contain  standard multivitamins and trace elements.  Continue IVF at [facility].  Continue  moderate SSI q6h.  TPN lab panels on Mondays & Thursdays.   PO intake still poor, megace initiated on 9/12  Plan to d/c to SNF without TPN  F/u daily.  Dolly Rias RPh 12/15/2014, 11:28 AM Pager [phone]

## 2014-12-15 NOTE — Consult Note (Signed)
WOC wound follow up Wound type: Surgical midline abdominal incision site.  Measurement: 10 cm x 4.2 cm x 2 cm  Wound bed:80% yellow, 20% pink blue sutures visible in wound bed today. Additional area noted above umbilicus. 0.5 cm in diameter  (Surgical site) will bridge an additional piece of foam to this area, protecting the surrounding skin.  Drainage (amount, consistency, odor) Minimal serosanguinous drainage.  No odor.  Periwound:Intact  Dry powder shave completed today due to hair regrowth.  Dressing procedure/placement/frequency:Cleanse abdominal wound with NS and pat dry. Apply white foam to wound bed, cover with black foam.  Black foam only to smaller wound above umbilicus.  Drape to intact skin between wounds for protection. Seal immediately achieved at 125 mmHg continuous therapy. Change Mon/Wed/Fri. Patient states he is discharging today.  [place] team will follow this admission.  Domenic Moras RN BSN [place] Pager [phone]

## 2014-12-15 NOTE — Progress Notes (Signed)
Clinical Social Work  Patient and family have accepted a bed at [facility]. MD anticipates DC to occur over weekend. SNF is agreeable to accept over the weekend if medically stable. Weekend SW to contact [facility] [phone]) who will give instructions on where to fax DC summary. Wife and dtr [phone]) will transport patient to SNF at DC. Signed FL2 on chart.  [place], [place] [phone]

## 2014-12-15 NOTE — Progress Notes (Signed)
Patient ID: Darryl Griffith, male   DOB: Aug 15, 1945, 69 y.o.   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 acetaminophen  650 mg Oral Q6H  . antiseptic oral rinse  7 mL Mouth Rinse BID  . bisacodyl  10 mg Rectal Daily  . feeding supplement (ENSURE ENLIVE)  237 mL Oral BID BM  . heparin subcutaneous  5,000 Units Subcutaneous 3 times per day  . insulin aspart  0-15 Units Subcutaneous 4 times per day  . lip balm  1 application Topical BID  . megestrol  400 mg Oral Daily  . meropenem (MERREM) IV  1 g Intravenous Q8H  . pantoprazole (PROTONIX) IV  40 mg Intravenous Q12H  . sertraline  25 mg Oral Daily    Past Medical History  Diagnosis Date  . Hypertension   . Anginal pain     hx of CP saw Dr Darryl Griffith 09/20/2013 H&P per chart   . Left Retroperitoneal mass s/p resection/L nephrectomy 07/13/2014   . Postoperative anemia due to acute blood loss 07/15/2014  . SBO (small bowel obstruction) 08/01/2014, 10/11/14  . Retroperitoneal fluid collection   . 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 Impaired fasting glucose 11/25/2014  . Benign  neoplasm of colon 11/25/2014  . Actinic keratosis 11/25/2014  . Mixed hyperlipidemia 11/25/2014    Social History  Substance Use Topics  . Smoking status: Never Smoker   . Smokeless tobacco: Never Used  . Alcohol Use: No    Family History  Problem Relation Age of Onset  . Adopted: Yes  . Family history unknown: Yes   Allergies  Allergen Reactions  . Compazine [Prochlorperazine] Other (See Comments)    Made pt really lethargic, and sleepy    SUBJECTIVE: He states that he has "felt better" implying that he is feeling worse today. He is having frequent stools and incontinence. He denies having diarrhea but his nurse disagrees. His wife notes that he is more lethargic recently. His oral intake remains poor.  Review of Systems: Constitutional: positive for anorexia, malaise and weight loss, negative for chills, fevers and sweats Eyes: negative Ears, nose, mouth, throat, and face: negative Respiratory: positive for cough, negative for sputum Cardiovascular: negative Gastrointestinal: positive for abdominal pain and diarrhea, negative for nausea and vomiting  OBJECTIVE: Filed Vitals:   12/14/14 0624 12/14/14 1605 12/15/14 0020 12/15/14 0545  BP: 148/77 149/81 154/84 151/77  Pulse: 102 113 103 104  Temp: 98.7 F (37.1 C) 98.9 F (37.2  C) 98.4 F (36.9 C) 98.7 F (37.1 C)  TempSrc: Oral Oral Oral Oral  Resp: 24 24 20 24   Height:      Weight:      SpO2: 100% 100% 100% 100%   Body mass index is 24.79 kg/(m^2).  General: He is alert and appears comfortable. He is picking at his lunch tray.  Lungs: Clear Cor: Regular S1 and S2 with no murmur  Abdomen: Soft and nontender. Vac dressing and midline wound. The description of his wound this morning at the time of factor dressing change was similar to how it appeared 2 days ago. There was no drainage, odor or surrounding cellulitis to suggest ongoing wound infection. Fluid in his drain bulb is now tan and purulent appearing Mood: He  appears more withdrawn. He has a very flat affect  Lab Results Lab Results  Component Value Date   WBC 11.6* 12/15/2014   HGB 7.8* 12/15/2014   HCT 24.3* 12/15/2014   MCV 84.7 12/15/2014   PLT 811* 12/15/2014    Lab Results  Component Value Date   CREATININE 1.06 12/15/2014   BUN 29* 12/15/2014   NA 134* 12/15/2014   K 4.8 12/15/2014   CL 105 12/15/2014   CO2 23 12/15/2014    Lab Results  Component Value Date   ALT 9* 12/14/2014   AST 16 12/14/2014   ALKPHOS 134* 12/14/2014   BILITOT 0.4 12/14/2014     Microbiology: Abscess specimen 08/01/2014: No organisms on Gram stain and no growth  Abscess specimen 10/12/2014: Gram-positive cocci in pairs and clusters on Gram stain and no growth Abscess specimen 11/10/2014: No organisms on Gram stain and VRE in culture Abscess specimen 11/29/2014: No organisms on Gram stain and no growth    ASSESSMENT: I had a long discussion with Darryl Griffith and his wife today about the extreme complexity of this situation. He has had multiple surgical procedures and radiologic procedures to try to manage his postoperative infections. He has been on antibiotics for over 2 months. Just as one problem seems to get better he develops new signs of infection. He was switched to meropenem 1 week ago when his midline abdominal wound started to drain pus. That wound looks much better today but he now has purulent drainage from his left upper quadrant drain. I have no recent positive cultures to help guide antibiotics therapy. He also now has diarrhea which could reflect anabiotic associated C. difficile colitis. He and his family are hoping that he can transition to oral antibiotics to facilitate transfer to a rehabilitation facility in [place]. I will order a C. difficile screen on stool and change him to oral levofloxacin and metronidazole. Optimal duration of antibiotics therapy will require close and careful follow-up. His daughter Darryl Griffith is hopeful that infectious  disease follow-up can be arranged with Darryl Griffith or one of his partners in [place] after they move.  PLAN: 1. Change meropenem to oral levofloxacin and metronidazole 2. Stool for C. difficile screen  [place] [phone] pager   [phone] cell 12/15/2014, 1:54 PM

## 2014-12-15 NOTE — Progress Notes (Signed)
Physical Therapy Treatment Patient Details Name: Devun Anna MRN: [id number] DOB: 08/02/1945 Today's Date: 12/15/2014    History of Present Illness Loki Wuthrich is a 69 y.o. male with a history of Left Flank Area Liposarcoma S/P Resection and Left Nephrectomy 06/2014 with Radiation Rx completed on 76/7209 complicated by abscess formation (+VRE) who presents to the ED 11/24/14  with complaints of ABD bloating x 2 days and nausea and vomiting x 4 today. He denies any hematemesis, fevers or chills. A Ct scan was performed 1 day ago for cancer staging and revealed a high grade partial small bowl obstruction.  on 11/29/14 Incision and drainage left back abscess, exploratory laparotomy, lysis of adhesions, small bowel resection, placement of drain into left retroperitoneal abscess     PT Comments    Pt in bed with reg lunch tray in front of him.  Poor po intake.  Assisted OOB to asmb in hallway using RW vs EVA walker.  Tolerated decreased amb distance which was expected due to decreased support.  Amb twice with one sitting rest break. amb limited by uncontrolled loose stools.  Returned to room then assisted to [facility] and assisted with hygiene.  Overall pt feeling "tired". Positioned in recliner.  Follow Up Recommendations  SNF     Equipment Recommendations       Recommendations for Other Services       Precautions / Restrictions Precautions Precautions: Fall Precaution Comments: monitor HR, L abd JP, woumd VAC Restrictions Weight Bearing Restrictions: No    Mobility  Bed Mobility Overal bed mobility: Needs Assistance Bed Mobility: Supine to Sit;Rolling Rolling: Min assist Sidelying to sit: Min assist       General bed mobility comments: instructed on "log Roll" tech to decrease ABD stress/pain.  better able to self perform.  Transfers Overall transfer level: Needs assistance Equipment used: None Transfers: Sit to/from Stand Sit to Stand: Min assist         General  transfer comment: 25% VC's on proper hand placement and turn completion.  Assisted to [facility].   Ambulation/Gait Ambulation/Gait assistance: +2 safety/equipment;+2 physical assistance;Min assist Ambulation Distance (Feet): 50 Feet (25 feet x 2 one sitting rest break) Assistive device: Rolling walker (2 wheeled) Gait Pattern/deviations: Step-to pattern;Step-through pattern;Drifts right/left;Trunk flexed     General Gait Details: used RW this time vs EVA.  Tolerated decreased amb distance which was expected with use of RW.  Required x 1 sitting rest break.  Amb distance limited by uncontrolled loose stools.    Stairs            Wheelchair Mobility    Modified Rankin (Stroke Patients Only)       Balance                                    Cognition Arousal/Alertness: Awake/alert Behavior During Therapy: WFL for tasks assessed/performed Overall Cognitive Status: Within Functional Limits for tasks assessed                      Exercises      General Comments        Pertinent Vitals/Pain Pain Assessment: 0-10 Pain Score: 4  Pain Location: ABD Pain Descriptors / Indicators: Tightness Pain Intervention(s): Monitored during session;Patient requesting pain meds-RN notified;Repositioned    Home Living  Prior Function            PT Goals (current goals can now be found in the care plan section) Progress towards PT goals: Progressing toward goals    Frequency  Min 3X/week    PT Plan Current plan remains appropriate    Co-evaluation             End of Session Equipment Utilized During Treatment: Gait belt Activity Tolerance: Treatment limited secondary to medical complications (Comment) (diarrhea) Patient left: in chair;with call bell/phone within reach;with family/visitor present     Time: 1400-1425 PT Time Calculation (min) (ACUTE ONLY): 25 min  Charges:  $Gait Training: 8-22 mins $Therapeutic  Activity: 8-22 mins                    G Codes:      Rica Koyanagi  PTA [facility]  Acute  Rehab Pager      [phone]

## 2014-12-15 NOTE — Progress Notes (Addendum)
Patient ID: Darryl Griffith, male   DOB: [birth year], 69 y.o.   MRN: [id number] [facility] PROGRESS NOTE  Darryl Griffith TWS:[id number] DOB: 1945-04-23 DOA: 11/24/2014 PCP: Gennette Pac, MD  Brief narrative:    69 y.o. male with past medical history of left flank liposarcoma S/P Resection and left nephrectomy 06/2014, status post radiation treatment completed 00/1749 complicated by an abscess formation (+VRE), s/p drain placement for ~ 3 weeks (removed 10/31/14). Pt was admitted on 11/24/14 with nausea and vomiting and was found to have SBO. General surgery was consulted on admission. Patient did not improve with conservative measures and underwent surgery 11/29/14: incision and drainage of left back abscess, exploratory laparotomy, lysis of adhesions, small bowel resection and placement of drain into left retroperitoneal abscess. Wound vac placed.Most recently he was on daptomycin and meropenem but now developed diarrhea and abx changed to levaquin and flagyl 12/15/2014.We are weaning TNA in anticipation of discharge tomorrow 12/16/2014.  Anticipated discharge: discharge 9/17.  Assessment/Plan:    Principal problem:  SBO status post resection with lysis of adhesions / postoperative ileus / left retroperitoneal abscess  - Patient found to have SBO on admission. He underwent surgical repair on 8/31 with exploratory laparotomy, lysis of adhesions, small bowel resection, placement of drain into left retroperitoneal abscess. Wound opened 12/10/14. - Wound vac placed subsequenlty. - Wound care assessment done 12/13/2014. Measurement:10.5cm x 4cm x 2cm with undermining from 11-1 o'clock measuring 1.5cm. Dressing recommendations: Wound cleansed with NS, patted dry. White foam (1 piece) placed as a wound contact layer due to exposed sutures. This was topped with black Granufoam (1 piece) and covered with drape. Dressing is attached to negative pressure (129mmHg continuous) and an immediate seal was  achieved.  - Dressing change done today by wound care and here is their assessment from today: Surgical midline abdominal incision site with measurement: 10 cm x 4.2 cm x 2 cm. Minimal serosanguinous drainage. Dressing procedure/placement/frequency: Cleansed abdominal wound with NS and patted dry. Applied white foam to wound bed, covered with black foam. Black foam placed only to smaller wound above umbilicus. Drape placed to intact skin between wounds for protection. Seal immediately achieved at 125 mmHg continuous therapy. Change dressing Mon/Wed/Fri. - Continue to wean TNA for nutritional support.  - Stopped meropenem today. Changed abx to Levaquin and flagyl.   Active problems:  Diarrhea - Stool sent for C.diff - Added flagyl per ID  Acute pulmonary edema - BNP elevated at 103. Patient received lasix. - Resolved.  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   Left retroperitoneal and left upper quadrant fluid collection  - Cultures initially positive for VRE. Cultures from 11/29/14 negative. - Status post IR drainage 11/27/14. - Stopped meropenem today (after 7 days of treatment). Dapto stopped after 12 days of treatment.  - Appreciate ID input.  Sacral pressure ulcer, stage 2   - Partial thickness loss of dermis with shallow open ulcer with red, pink wound bed without slough, red to purple over entire sacrum, two areas approximately 10 x 5 cm each on both buttocks - We will continue therapeutic mattress and pressure redistribution   Depression - Secondary to prolonged illness  - Psych has seen the pt in consultation. - Continue Zoloft 25 mg daily   Essential hypertension  - BP at goal    Hypokalemia / Hypomagnesemia - Secondary to GI losses. Repleted.  - Potassium and magnesium WNL  Symptomatic Anemia / Acute postoperative blood loss anemia  - Has  received 2 units of PRBCs 11/27/14, 1 unit 12/01/14, 1 unit 12/05/14 and 1 unit 12/08/14. - Hemoglobin slightly down today at 7.8 so we will recheck it tomorrow and if further drop then will given 1 unit PRBC.  Hyponatremia - Due to GI losses - Improved with IV fluids    Neutropenia, drug induced  - Secondary to Vioxx  - WBC count improved, still up this am at 11.6, mild elevation.    Failure to thrive in adult / Severe protein calorie malnutrition - In the context of prolonged illness - We will continue megace and nutritional supplementation   DVT prophylaxis - Heparin subQ ordered in hospital    Code Status: Full Code Family Communication: Family not at the bedside this am; updated wife Darryl Griffith over the phone [phone] and [phone] Disposition Plan: Discharge 9/17   IV access:  PICC line placed 10/12/14  Procedures and diagnostic studies in past 72 hours:    Dg Chest Port 1 View 12/10/2014    Increased bilateral upper lobe opacities are noted concerning for edema or possibly pneumonia. Increased mild left pleural effusion is noted.     Medical Consultants:  Infectious Disease: Carlyle Basques, MD General Surgery: Alphonsa Overall, MD  Other Consultants:  Nutrition Physical therapy Social work [place]  IAnti-Infectives:   Total days of antibiotics 63 Day 11 daptomycin. Stopped dapto today 9/14. Day 7 meropenem. Stopped 12/15/2014.    Leisa Lenz, MD  [facility] Pager [phone]  Time spent in minutes: 25 minutes  If 7PM-7AM, please contact night-coverage [email or web address] Password [facility] 12/15/2014, 7:36 PM   LOS: 21 days    HPI/Subjective: No acute overnight events. Patient reports feeling tired.   Objective: Filed Vitals:   12/14/14 1605 12/15/14 0020 12/15/14 0545 12/15/14 1446  BP: 149/81 154/84 151/77 137/77  Pulse: 113 103 104 123  Temp: 98.9 F (37.2 C) 98.4 F (36.9 C) 98.7 F (37.1 C) 97.7 F (36.5 C)  TempSrc: Oral Oral Oral Oral  Resp: 24 20 24 22    Height:      Weight:      SpO2: 100% 100% 100% 100%    Intake/Output Summary (Last 24 hours) at 12/15/14 1936 Last data filed at 12/15/14 1858  Gross per 24 hour  Intake 1247.01 ml  Output   4001 ml  Net -2753.99 ml    Exam:   General:  Pt is alert, no acute distress   Cardiovascular: RRR, S1/S2 appreciated   Respiratory: bilateral air entry, no wheezing   Abdomen: wound vac in place, non tender abdomen, (+) BS  Extremities: no edema, pulses palpable   Neuro: Non focal   Data Reviewed: Basic Metabolic Panel:  Recent Labs Lab 12/10/14 1701 12/11/14 0610 12/14/14 0520 12/15/14 0620  NA 130* 130* 135 134*  K 5.1 4.8 5.0 4.8  CL 105 101 107 105  CO2 20* 19* 22 23  GLUCOSE 154* 148* 136* 117*  BUN 30* 32* 30* 29*  CREATININE 0.99 1.09 0.94 1.06  CALCIUM 7.7* 8.2* 8.6* 8.7*  MG  --  1.9 2.0  --   PHOS  --  3.9 4.2  --    Liver Function Tests:  Recent Labs Lab 12/11/14 0610 12/14/14 0520  AST 17 16  ALT 9* 9*  ALKPHOS 111 134*  BILITOT 0.4 0.4  PROT 5.9* 5.9*  ALBUMIN 2.5* 2.2*   No results for input(s): LIPASE, AMYLASE in the last 168 hours. No  results for input(s): AMMONIA in the last 168 hours. CBC:  Recent Labs Lab 12/10/14 1701 12/11/14 0610 12/14/14 0520 12/15/14 0620  WBC 7.1 8.3 10.4 11.6*  NEUTROABS  --  6.3  --   --   HGB 8.3* 9.0* 8.0* 7.8*  HCT 25.3* 27.4* 24.7* 24.3*  MCV 84.9 84.3 84.6 84.7  PLT 558* 638* 704* 811*   Cardiac Enzymes:  Recent Labs Lab 12/11/14 0610  CKTOTAL 13*   BNP: Invalid input(s): POCBNP CBG:  Recent Labs Lab 12/14/14 1250 12/14/14 1802 12/15/14 0543 12/15/14 1124 12/15/14 1726  GLUCAP 126* 125* 109* 131* 138*    No results found for this or any previous visit (from the past 240 hour(s)).   Scheduled Meds: . acetaminophen  650 mg Oral Q6H  . bisacodyl  10 mg Rectal Daily  . heparin subcutaneous  5,000 Units Subcutaneous 3 times per day  . insulin aspart  0-15 Units Subcutaneous 4  times per day  . megestrol  400 mg Oral Daily  . meropenem (MERREM) IV  1 g Intravenous Q8H  . pantoprazole (PROTONIX) IV  40 mg Intravenous Q12H  . sertraline  25 mg Oral Daily   Continuous Infusions: . sodium chloride 10 mL/hr at 12/14/14 1818  . Marland KitchenTPN (CLINIMIX-E) Adult 40 mL/hr at 12/15/14 1706   And  . fat emulsion 240 mL (12/15/14 1707)

## 2014-12-15 NOTE — Progress Notes (Signed)
Nutrition Follow-up  DOCUMENTATION CODES:   Non-severe (moderate) malnutrition in context of chronic illness  INTERVENTION:  - Continue TPN/TPN wean per pharmacy - Continue Ensure Enlive BID - RD will continue to monitor for needs  NUTRITION DIAGNOSIS:   Inadequate oral intake related to inability to eat as evidenced by NPO status. -diet advanced with minimal intakes  GOAL:   Patient will meet greater than or equal to 90% of their needs -unmet  MONITOR:   PO intake, Supplement acceptance, Weight trends, Labs, I & O's, Other (Comment) (TPN regimen)  ASSESSMENT:   69 y.o. male with a history of Left Flank Area Liposarcoma S/P Resection and Left Nephrectomy 06/2014 with Radiation Rx completed on 16/1096 complicated by abscess formation (+VRE) who presents to the ED with complaints of ABD bloating x 2 days and nausea and vomiting x 4 today. He denies any hematemesis, fevers or chills. A Ct scan was performed [age] y.o. male with a history of Left Flank Area Liposarcoma S/P Resection and Left Nephrectomy 06/2014 with Radiation Rx completed on 06/5407 complicated by abscess formation (+VRE) who presents to the ED with complaints of ABD bloating x 2 days and nausea and vomiting x 4 today. He denies any hematemesis, fevers or chills. A Ct scan was performed 1 day ago for cancer staging and revealed a high grade partial small bowl obstruction and General surgery was consulted and an NGT was placed. He was referred for medical admission. He is on oral Linezolid and Metronidazole for +VRE For a chronic intra-abdominal abscess. y ago for cancer staging and revealed a high grade partial small bowl obstruction and General surgery was consulted and an NGT was placed. He was referred for medical admission. He is on oral Linezolid and Metronidazole for +VRE For a chronic intra-abdominal abscess.   9/16 Pt continues with Clinimix E 5/20 @ 40 mL/hr with 20% lipids @ 10 mL/hr will  provide 48 grams protein (44% minimum needs), 1325 kcal (58% minimum needs).  Chart review indicates 75% completion of lunch yesterday but no meal slip in calorie count envelope for this meal. [facility] indicates that for this meal pt had: tomato soup with crackers, peaches, Cheetos, and lemonade.  Slip in envelope from dinner yesterday indicates pt only ate cream of chicken soup.   He did not touch breakfast this AM and indicates that it is not due to lack of appetite. When asked why he did not eat pt states "it's a very long story." He states that this is a similar case for poor intake of dinner last night.   When asked if he is experiencing nausea or abdominal pain pt indicates "I feel decent." He and RN state he had an entire bottle of Ensure earlier this AM (350 kcal, 20 grams protein).  Per pharmacy note this AM: At 1800 today:  Continue Clinimix E 5/20 at 40 ml/hr.  20% fat emulsion at 10 ml/hr.  TPN to contain standard multivitamins and trace elements.  Continue IVF at [facility].  Continue moderate SSI q6h.  TPN lab panels on Mondays & Thursdays.  PO intake still poor, megace initiated on 9/12  Plan to d/c to SNF without TPN  Not meeting needs with current PO intakes and current TPN rate. Medications reviewed. Labs reviewed; CBGs: 109-134 mg/dL, Na: 134 mmol/L, BUN elevated, Ca: 8.7 mg/dL.    9/14 - Unable to talk with pt x2 attempted visits.  - New order for kcal count and envelope hung on the door. RD  to follow up with results. - Clinimix E 5/20 decreased from 93 mL/hr to 83 mL/hr with 20% lipids @ 10 mL/hr on 9/13 and plan to further decrease to 40 mL/hr today.  - Clinimix E 5/20 @ 40 mL/hr with 20% lipids @ 10 mL/hr will provide 48 grams protein (44% minimum needs), 1325 kcal (58% minimum needs). - Will order Ensure Enlive to supplement PO and assist with continued TPN wean. - Per pharmacy note today: At 1800 today:  decrease Clinimix E 5/20 to 40 ml/hr.  20%  fat emulsion at 10 ml/hr.  TPN to contain standard multivitamins and trace elements.  Continue IVF at [facility].  Continue moderate SSI q6h.  TPN lab panels on Mondays & Thursdays.  Advancing diet/ Encourage PO intake, Megace started   9/12 - POD # 12. NGT now removed and diet advanced to CLD 9/8 @ 1105.  - Per chart review, pt consumed 35% breakfast and 20% lunch yesterday.  - Pt denies having anything to eat/drink this AM and states pain to abdominal area. - Per MD notes, place to continue current TPN order until diet is further advanced. Pt currently receiving Clinimix E 5/20 @ 93 with 20% lipids @ 10 mL/hr. This regimen is providing 2444 kcal, 112 grams protein which is meeting needs.  *See previous RD follow-up notes for information earlier in admission.   Diet Order:  Diet regular Room service appropriate?: Yes; Fluid consistency:: Thin TPN (CLINIMIX-E) Adult TPN (CLINIMIX-E) Adult  Skin:  Wound (see comment) (Stage 2 sacral pressure ulcer, abdominal incision)  Last BM:  9/15  Height:   Ht Readings from Last 1 Encounters:  11/24/14 5\' 8"  (1.727 m)    Weight:   Wt Readings from Last 1 Encounters:  11/24/14 163 lb (73.936 kg)    Ideal Body Weight:  70 kg (kg)  BMI:  Body mass index is 24.79 kg/(m^2).  Estimated Nutritional Needs:   Kcal:  5859-2924  Protein:  110-120 grams  Fluid:  2.2-2.5 L/day  EDUCATION NEEDS:   No education needs identified at this time     Jarome Matin, RD, LDN Inpatient Clinical Dietitian Pager # [phone] After hours/weekend pager # [phone]

## 2014-12-15 NOTE — Progress Notes (Signed)
Patient ID: Darryl Griffith, male   DOB: 04/27/1945, 69 y.o.   MRN: [id number]  Progress Note: General Surgery Service   Subjective: Intake improving with more selection except for breakfast today.    Objective: Vital signs in last 24 hours: Temp:  [98.4 F (36.9 C)-98.9 F (37.2 C)] 98.7 F (37.1 C) (09/16 0545) Pulse Rate:  [103-113] 104 (09/16 0545) Resp:  [20-24] 24 (09/16 0545) BP: (149-154)/(77-84) 151/77 mmHg (09/16 0545) SpO2:  [100 %] 100 % (09/16 0545) Last BM Date: 12/14/14  Intake/Output from previous day: 09/15 0701 - 09/16 0700 In: 2876.4 [P.O.:720; I.V.:145.3; IV Piggyback:300; TPN:1711.1] Out: 4106 [Urine:4100; Drains:5; Stool:1] Intake/Output this shift: Total I/O In: -  Out: 3329 [Urine:1600; Stool:2]   Lungs: breathing comfortably CV: tachycardic Abd: soft, non distended, approp tender.  Vac in place. Drain output coming down, but it looks purulent now.     Extremities: trace edema Neuro: alert, oriented to self, place and event   Lab Results: CBC   Recent Labs  12/14/14 0520 12/15/14 0620  WBC 10.4 11.6*  HGB 8.0* 7.8*  HCT 24.7* 24.3*  PLT 704* 811*   BMET  Recent Labs  12/14/14 0520 12/15/14 0620  NA 135 134*  K 5.0 4.8  CL 107 105  CO2 22 23  GLUCOSE 136* 117*  BUN 30* 29*  CREATININE 0.94 1.06  CALCIUM 8.6* 8.7*   PT/INR No results for input(s): LABPROT, INR in the last 72 hours. ABG No results for input(s): PHART, HCO3 in the last 72 hours.  Invalid input(s): PCO2, PO2  Studies/Results: No results found.  Anti-infectives: Anti-infectives    Start     Dose/Rate Route Frequency Ordered Stop   12/10/14 1800  meropenem (MERREM) 1 g in sodium chloride 0.9 % 100 mL IVPB     1 g 200 mL/hr over 30 Minutes Intravenous Every 8 hours 12/10/14 1741     12/03/14 1300  DAPTOmycin (CUBICIN) 600 mg in sodium chloride 0.9 % IVPB  Status:  Discontinued     600 mg 224 mL/hr over 30 Minutes Intravenous Every 24 hours 12/03/14 1157  12/13/14 1554   11/29/14 0830  [MAR Hold]  ceFAZolin (ANCEF) IVPB 2 g/50 mL premix     (MAR Hold since 11/29/14 0825)   2 g 100 mL/hr over 30 Minutes Intravenous On call to O.R. 11/28/14 0850 11/29/14 0908   11/25/14 0000  metroNIDAZOLE (FLAGYL) IVPB 500 mg  Status:  Discontinued     500 mg 100 mL/hr over 60 Minutes Intravenous Every 8 hours 11/24/14 2355 12/10/14 1729   11/25/14 0000  linezolid (ZYVOX) IVPB 600 mg  Status:  Discontinued     600 mg 300 mL/hr over 60 Minutes Intravenous Every 12 hours 11/24/14 2355 12/03/14 1145      Medicaions: Scheduled Meds: . acetaminophen  650 mg Oral Q6H  . bisacodyl  10 mg Rectal Daily  . feeding supplement (ENSURE ENLIVE)  237 mL Oral BID BM  . heparin subcutaneous  5,000 Units Subcutaneous 3 times per day  . insulin aspart  0-15 Units Subcutaneous 4 times per day  . lip balm  1 application Topical BID  . megestrol  400 mg Oral Daily  . meropenem (MERREM) IV  1 g Intravenous Q8H  . pantoprazole (PROTONIX) IV  40 mg Intravenous Q12H  . sertraline  25 mg Oral Daily   Continuous Infusions: . sodium chloride 10 mL/hr at 12/14/14 1818  . Marland KitchenTPN (CLINIMIX-E) Adult 40 mL/hr at 12/14/14 1745  And  . fat emulsion 240 mL (12/14/14 1745)   PRN Meds:.acetaminophen (TYLENOL) oral liquid 160 mg/5 mL, [DISCONTINUED] acetaminophen **OR** acetaminophen, alum & mag hydroxide-simeth, baclofen, clonazepam, hydrALAZINE, LORazepam, magic mouthwash, menthol-cetylpyridinium, metoprolol, ondansetron **OR** [DISCONTINUED] ondansetron (ZOFRAN) IV, oxyCODONE, phenol, promethazine, sodium chloride, traMADol  Assessment/Plan: Patient Active Problem List   Diagnosis Date Noted  . Hypomagnesemia 12/13/2014  . Failure to thrive in adult 12/13/2014  . Protein-calorie malnutrition, severe 12/13/2014  . Depression 12/13/2014  . Neutropenia, drug-induced 12/13/2014  . Postoperative ileus 12/13/2014  . Postoperative anemia due to acute blood loss 12/13/2014  . Benign  essential HTN 12/13/2014  . Ulcer of sacral region, stage 2 12/13/2014  . Malnutrition of moderate degree 12/13/2014  . Acute pulmonary edema 12/11/2014  . Fluid collection at surgical site   . Small bowel obstruction   . Hypokalemia 11/25/2014  . Essential (primary) hypertension 11/25/2014  . Recurrent SBO (small bowel obstruction) 11/07/2014  . Abscess, retroperitoneal   . Hyponatremia 09/18/2014  . 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  -Wound vac.  Would plan to leave drain since output is murkier.     LOS: 21 days   [place], [facility]

## 2014-12-16 LAB — BASIC METABOLIC PANEL
Anion gap: 7 (ref 5–15)
BUN: 31 mg/dL — AB (ref 6–20)
CALCIUM: 9 mg/dL (ref 8.9–10.3)
CO2: 23 mmol/L (ref 22–32)
CREATININE: 1.04 mg/dL (ref 0.61–1.24)
Chloride: 104 mmol/L (ref 101–111)
Glucose, Bld: 123 mg/dL — ABNORMAL HIGH (ref 65–99)
Potassium: 4.6 mmol/L (ref 3.5–5.1)
SODIUM: 134 mmol/L — AB (ref 135–145)

## 2014-12-16 LAB — CBC
HCT: 25 % — ABNORMAL LOW (ref 39.0–52.0)
Hemoglobin: 7.8 g/dL — ABNORMAL LOW (ref 13.0–17.0)
MCH: 26.5 pg (ref 26.0–34.0)
MCHC: 31.2 g/dL (ref 30.0–36.0)
MCV: 85 fL (ref 78.0–100.0)
PLATELETS: 807 10*3/uL — AB (ref 150–400)
RBC: 2.94 MIL/uL — AB (ref 4.22–5.81)
RDW: 20 % — ABNORMAL HIGH (ref 11.5–15.5)
WBC: 10.8 10*3/uL — ABNORMAL HIGH (ref 4.0–10.5)

## 2014-12-16 LAB — C DIFFICILE QUICK SCREEN W PCR REFLEX
C DIFFICILE (CDIFF) TOXIN: NEGATIVE
C DIFFICLE (CDIFF) ANTIGEN: NEGATIVE
C Diff interpretation: NEGATIVE

## 2014-12-16 LAB — GLUCOSE, CAPILLARY: GLUCOSE-CAPILLARY: 114 mg/dL — AB (ref 65–99)

## 2014-12-16 LAB — PREPARE RBC (CROSSMATCH)

## 2014-12-16 MED ORDER — SODIUM CHLORIDE 0.9 % IV SOLN
Freq: Once | INTRAVENOUS | Status: AC
  Administered 2014-12-16: 18:00:00 via INTRAVENOUS

## 2014-12-16 NOTE — Progress Notes (Signed)
Progress Note: General Surgery Service   Subjective: Moderate pain lower abdomen, no diarrhea overnight, spoke to daughter on phone briefly about wound care  Objective: Vital signs in last 24 hours: Temp:  [95.7 F (35.4 C)-98.5 F (36.9 C)] 95.7 F (35.4 C) (09/17 0607) Pulse Rate:  [97-123] 97 (09/17 0607) Resp:  [20-22] 20 (09/17 0607) BP: (137-151)/(77-86) 145/81 mmHg (09/17 0607) SpO2:  [100 %] 100 % (09/17 0607) Last BM Date: 12/16/14  Intake/Output from previous day: 09/16 0701 - 09/17 0700 In: 461.2 [P.O.:300; I.V.:61.2; TPN:100] Out: 4020 [Urine:3300; Drains:720] Intake/Output this shift:     Abd: vac in place without surrounding erythema, drain with small amount purulence  Neuro: aox3, answers appropriate questions  Lab Results: CBC   Recent Labs  12/14/14 0520 12/15/14 0620  WBC 10.4 11.6*  HGB 8.0* 7.8*  HCT 24.7* 24.3*  PLT 704* 811*   BMET  Recent Labs  12/14/14 0520 12/15/14 0620  NA 135 134*  K 5.0 4.8  CL 107 105  CO2 22 23  GLUCOSE 136* 117*  BUN 30* 29*  CREATININE 0.94 1.06  CALCIUM 8.6* 8.7*   PT/INR No results for input(s): LABPROT, INR in the last 72 hours. ABG No results for input(s): PHART, HCO3 in the last 72 hours.  Invalid input(s): PCO2, PO2  Studies/Results: No results found.  Anti-infectives: Anti-infectives    Start     Dose/Rate Route Frequency Ordered Stop   12/15/14 1600  levofloxacin (LEVAQUIN) tablet 500 mg     500 mg Oral Daily 12/15/14 1416     12/15/14 1600  metroNIDAZOLE (FLAGYL) tablet 500 mg     500 mg Oral 4 times per day 12/15/14 1416     12/10/14 1800  meropenem (MERREM) 1 g in sodium chloride 0.9 % 100 mL IVPB  Status:  Discontinued     1 g 200 mL/hr over 30 Minutes Intravenous Every 8 hours 12/10/14 1741 12/15/14 1416   12/03/14 1300  DAPTOmycin (CUBICIN) 600 mg in sodium chloride 0.9 % IVPB  Status:  Discontinued     600 mg 224 mL/hr over 30 Minutes Intravenous Every 24 hours 12/03/14 1157  12/13/14 1554   11/29/14 0830  [MAR Hold]  ceFAZolin (ANCEF) IVPB 2 g/50 mL premix     (MAR Hold since 11/29/14 0825)   2 g 100 mL/hr over 30 Minutes Intravenous On call to O.R. 11/28/14 0850 11/29/14 0908   11/25/14 0000  metroNIDAZOLE (FLAGYL) IVPB 500 mg  Status:  Discontinued     500 mg 100 mL/hr over 60 Minutes Intravenous Every 8 hours 11/24/14 2355 12/10/14 1729   11/25/14 0000  linezolid (ZYVOX) IVPB 600 mg  Status:  Discontinued     600 mg 300 mL/hr over 60 Minutes Intravenous Every 12 hours 11/24/14 2355 12/03/14 1145      Medicaions: Scheduled Meds: . acetaminophen  650 mg Oral Q6H  . antiseptic oral rinse  7 mL Mouth Rinse BID  . bisacodyl  10 mg Rectal Daily  . feeding supplement (ENSURE ENLIVE)  237 mL Oral BID BM  . heparin subcutaneous  5,000 Units Subcutaneous 3 times per day  . insulin aspart  0-15 Units Subcutaneous 4 times per day  . levofloxacin  500 mg Oral Daily  . lip balm  1 application Topical BID  . megestrol  400 mg Oral Daily  . metroNIDAZOLE  500 mg Oral 4 times per day  . pantoprazole (PROTONIX) IV  40 mg Intravenous Q12H  . sertraline  25 mg Oral Daily   Continuous Infusions: . sodium chloride 10 mL/hr at 12/14/14 1818  . Marland KitchenTPN (CLINIMIX-E) Adult 40 mL/hr at 12/15/14 1706   And  . fat emulsion 240 mL (12/15/14 1707)   PRN Meds:.acetaminophen (TYLENOL) oral liquid 160 mg/5 mL, [DISCONTINUED] acetaminophen **OR** acetaminophen, alum & mag hydroxide-simeth, baclofen, clonazepam, hydrALAZINE, LORazepam, magic mouthwash, menthol-cetylpyridinium, metoprolol, ondansetron **OR** [DISCONTINUED] ondansetron (ZOFRAN) IV, oxyCODONE, phenol, promethazine, sodium chloride, traMADol  Assessment/Plan: Patient Active Problem List   Diagnosis Date Noted  . Hypomagnesemia 12/13/2014  . Failure to thrive in adult 12/13/2014  . Protein-calorie malnutrition, severe 12/13/2014  . Depression 12/13/2014  . Neutropenia, drug-induced 12/13/2014  . Postoperative ileus  12/13/2014  . Postoperative anemia due to acute blood loss 12/13/2014  . Benign essential HTN 12/13/2014  . Ulcer of sacral region, stage 2 12/13/2014  . Malnutrition of moderate degree 12/13/2014  . Acute pulmonary edema 12/11/2014  . Fluid collection at surgical site   . Small bowel obstruction   . Hypokalemia 11/25/2014  . Essential (primary) hypertension 11/25/2014  . Recurrent SBO (small bowel obstruction) 11/07/2014  . Abscess, retroperitoneal   . Hyponatremia 09/18/2014  . 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

## 2014-12-16 NOTE — Progress Notes (Signed)
Patient ID: Darryl Griffith, male   DOB: 1945/04/27, 69 y.o.   MRN: [id number] [facility] PROGRESS NOTE  Nawaf Strange FEO:[id number] DOB: 03/28/1946 DOA: 11/24/2014 PCP: Gennette Pac, MD  Brief narrative:    69 y.o. male with past medical history of left flank liposarcoma S/P Resection and left nephrectomy 06/2014, status post radiation treatment completed 32/5498 complicated by an abscess formation (+VRE), s/p drain placement for ~ 3 weeks (removed 10/31/14). Pt was admitted on 11/24/14 with nausea and vomiting and was found to have SBO. General surgery was consulted on admission. Patient did not improve with conservative measures and underwent surgery 11/29/14: incision and drainage of left back abscess, exploratory laparotomy, lysis of adhesions, small bowel resection and placement of drain into left retroperitoneal abscess. Wound vac placed.Most recently he was on daptomycin and meropenem but now developed diarrhea and abx changed to levaquin and flagyl 12/15/2014.  Anticipated discharge: 9/18. Needs repeat CBC today and check stool for C.diff.   Assessment/Plan:    Principal problem:  SBO status post resection with lysis of adhesions / postoperative ileus / left retroperitoneal abscess  - Found to have SBO on admission. Pt is s/p surgical repair on 8/31 with exploratory laparotomy, lysis of adhesions, small bowel resection, placement of drain into left retroperitoneal abscess. Wound opened 12/10/14.Wound vac placed subsequenlty. - Wound care assessment (12/13/2014).  Measurement: 10.5cm x 4cm x 2cm with undermining from 11-1 o'clock measuring 1.5cm. Dressing recommendations: Wound cleansed with NS, patted dry. White foam (1 piece) placed as a wound contact layer due to exposed sutures. This was topped with black Granufoam (1 piece) and covered with drape. Dressing is attached to negative pressure (113mmHg continuous) and an immediate seal was achieved.  - Wound care assessment  (12/15/2014): Surgical midline abdominal incision site with measurement: 10 cm x 4.2 cm x 2 cm. Minimal serosanguinous drainage. Dressing procedure/placement/frequency: Cleansed abdominal wound with NS and patted dry. Applied white foam to wound bed, covered with black foam. Black foam placed only to smaller wound above umbilicus. Drape placed to intact skin between wounds for protection. - Dressing change Mon/Wed/Fri. - Continue to wean TNA  - Abx changed to Levaquin and flagyl 12/15/2014. Please refer to antiinfectives section for abx given during this hospital stay.   Active problems:  Diarrhea - Had apparently 7 BM's 9/16 but none today so stool never sent for C.diff - Continue flagyl.   Acute pulmonary edema - BNP elevated at 103.  - Improved with lasix.   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   Left retroperitoneal and left upper quadrant fluid collection  - Cultures initially positive for VRE. Cultures from 11/29/14 negative. - Status post IR drainage 11/27/14. 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  - Supplemented   Symptomatic Anemia / Acute postoperative blood loss anemia  - Has received 2 units of PRBCs 11/27/14, 1 unit 12/01/14, 1 unit 12/05/14 and 1  unit 12/08/14. - Hemoglobin slightly down to 7.8 but stable. Will give 1 unit PRBC to keep Hgb above  8.  Hyponatremia - Secondary to GI losses - Improved with fluids    Neutropenia, drug induced  - Secondary to Vioxx  - WBC count stable   Failure to thrive in adult / Severe protein calorie malnutrition - In the context of prolonged illness - On megace and nutritional supplementation   DVT prophylaxis - Heparin subQ    Code Status: Full Code Family Communication: Family not at the bedside this am; updated wife [name] over the phone [phone] and [phone] Disposition Plan: Discharge 9/18   IV access:  PICC line placed 10/12/14  Procedures and diagnostic studies in past 72 hours:    Dg Chest Port 1 View 12/10/2014    Increased bilateral upper lobe opacities are noted concerning for edema or possibly pneumonia. Increased mild left pleural effusion is noted.     Medical Consultants:  Infectious Disease: Carlyle Basques, MD General Surgery: Alphonsa Overall, MD  Other Consultants:  Nutrition Physical therapy Social work [place]  IAnti-Infectives:   Total days of antibiotics 63 Day 11 daptomycin. Stopped dapto today 9/14. Day 7 meropenem. Stopped 12/15/2014.    Leisa Lenz, MD  [facility] Pager [phone]  Time spent in minutes: 15 minutes  If 7PM-7AM, please contact night-coverage [email or web address] Password [email or web address] 12/16/2014, 2:50 PM   LOS: 22 days    HPI/Subjective: No acute overnight events. Patient reports no vomiting.   Objective: Filed Vitals:   12/15/14 2013 12/16/14 0607 12/16/14 1027 12/16/14 1416  BP: 151/86 145/81 147/69 107/91  Pulse: 110 97 100 122  Temp: 98.5 F (36.9 C) 95.7 F (35.4 C) 97.6 F (36.4 C) 97.5 F (36.4 C)  TempSrc: Oral Oral Oral Oral  Resp: 22 20 18 18   Height:      Weight:      SpO2: 100% 100% 99% 95%    Intake/Output Summary (Last 24 hours) at 12/16/14 1450 Last data filed at 12/16/14 1447  Gross per 24 hour  Intake    240 ml  Output   3170 ml  Net  -2930 ml    Exam:   General:  Pt is not acute distress    Cardiovascular: tachycardic, appreciate S1, S2   Respiratory: no wheezing, no rhonchi   Abdomen: wound vac (+), (+) BS  Extremities: no leg swelling, palpable pulses bilaterally    Neuro: No focal deficits   Data Reviewed: Basic Metabolic Panel:  Recent Labs Lab 12/10/14 1701 12/11/14 0610 12/14/14 0520 12/15/14 0620 12/16/14 0900  NA 130* 130* 135 134* 134*  K 5.1 4.8 5.0 4.8 4.6  CL 105 101 107 105 104  CO2 20* 19* 22 23 23   GLUCOSE 154* 148* 136* 117* 123*  BUN 30* 32* 30* 29* 31*  CREATININE 0.99 1.09 0.94 1.06 1.04  CALCIUM 7.7* 8.2* 8.6* 8.7* 9.0  MG  --  1.9 2.0  --   --   PHOS  --  3.9 4.2  --   --    Liver Function Tests:  Recent Labs Lab 12/11/14 0610 12/14/14 0520  AST 17 16  ALT 9* 9*  ALKPHOS 111 134*  BILITOT 0.4 0.4  PROT 5.9* 5.9*  ALBUMIN 2.5* 2.2*   No results for input(s): LIPASE, AMYLASE in the last 168 hours. No results for input(s): AMMONIA in the last 168 hours. CBC:  Recent Labs Lab 12/10/14 1701 12/11/14 0610 12/14/14 0520 12/15/14 0620 12/16/14 0900  WBC  7.1 8.3 10.4 11.6* 10.8*  NEUTROABS  --  6.3  --   --   --   HGB 8.3* 9.0* 8.0* 7.8* 7.8*  HCT 25.3* 27.4* 24.7* 24.3* 25.0*  MCV 84.9 84.3 84.6 84.7 85.0  PLT 558* 638* 704* 811* 807*   Cardiac Enzymes:  Recent Labs Lab 12/11/14 0610  CKTOTAL 13*   BNP: Invalid input(s): POCBNP CBG:  Recent Labs Lab 12/14/14 1802 12/15/14 0543 12/15/14 1124 12/15/14 1726 12/16/14 0603  GLUCAP 125* 109* 131* 138* 114*    No results found for this or any previous visit (from the past 240 hour(s)).   Scheduled Meds: . acetaminophen  650 mg Oral Q6H  . bisacodyl  10 mg Rectal Daily  . heparin subcutaneous  5,000 Units Subcutaneous 3 times per day  . insulin aspart  0-15 Units Subcutaneous 4 times per day  . megestrol  400 mg Oral Daily  . meropenem (MERREM) IV  1 g Intravenous Q8H  . pantoprazole (PROTONIX) IV  40 mg Intravenous Q12H  . sertraline  25 mg Oral Daily    Continuous Infusions: . sodium chloride 10 mL/hr at 12/14/14 1818  . Marland KitchenTPN (CLINIMIX-E) Adult 40 mL/hr at 12/15/14 1706   And  . fat emulsion 240 mL (12/15/14 1707)

## 2014-12-16 NOTE — Clinical Social Work Note (Signed)
CSW spoke with MD who stated that pt is not ready for discharge today and possibly will be ready tomorrow.  MD stated that she spoke with pt's daughter and she is aware of the discharge plans  CSW called and spoke with pt's [facility] to let them know pt was not ready for d/c today. SNF stated that pt can come tomorrow if ready for d/c  .Dede Query, LCSW [facility] Clinical Social Worker - Weekend Coverage cell #: [phone]

## 2014-12-17 MED ORDER — SERTRALINE HCL 25 MG PO TABS: 25.0000 mg | ORAL_TABLET | Freq: Every day | ORAL | Status: AC

## 2014-12-17 MED ORDER — LORAZEPAM 2 MG/ML IJ SOLN: 0.5000 mg | Freq: Every evening | INTRAMUSCULAR | Status: AC | PRN

## 2014-12-17 MED ORDER — MEGESTROL ACETATE 400 MG/10ML PO SUSP: 400.0000 mg | Freq: Every day | ORAL | Status: AC

## 2014-12-17 MED ORDER — MAGIC MOUTHWASH: 15.0000 mL | Freq: Four times a day (QID) | ORAL | Status: AC | PRN

## 2014-12-17 MED ORDER — ENSURE ENLIVE PO LIQD: 237.0000 mL | Freq: Two times a day (BID) | ORAL | Status: AC

## 2014-12-17 MED ORDER — OXYCODONE HCL 5 MG PO TABS: 5.0000 mg | ORAL_TABLET | ORAL | Status: AC | PRN

## 2014-12-17 MED ORDER — FAMOTIDINE 20 MG PO TABS: 20.0000 mg | ORAL_TABLET | Freq: Two times a day (BID) | ORAL | Status: AC

## 2014-12-17 MED ORDER — LEVOFLOXACIN 500 MG PO TABS: 500.0000 mg | ORAL_TABLET | Freq: Every day | ORAL | Status: AC

## 2014-12-17 MED ORDER — ACETAMINOPHEN 160 MG/5ML PO SOLN: 650.0000 mg | ORAL | Status: AC | PRN

## 2014-12-17 MED ORDER — ALUM & MAG HYDROXIDE-SIMETH 200-200-20 MG/5ML PO SUSP: 30.0000 mL | Freq: Four times a day (QID) | ORAL | Status: AC | PRN

## 2014-12-17 MED ORDER — ONDANSETRON 8 MG PO TBDP: 8.0000 mg | ORAL_TABLET | Freq: Three times a day (TID) | ORAL | Status: AC

## 2014-12-17 MED ORDER — BACLOFEN 10 MG PO TABS: 10.0000 mg | ORAL_TABLET | Freq: Three times a day (TID) | ORAL | Status: AC | PRN

## 2014-12-17 MED ORDER — TRAZODONE HCL 50 MG PO TABS: 50.0000 mg | ORAL_TABLET | Freq: Every day | ORAL | Status: AC

## 2014-12-17 MED ORDER — CLONAZEPAM 0.5 MG PO TBDP: 0.5000 mg | ORAL_TABLET | Freq: Two times a day (BID) | ORAL | Status: AC | PRN

## 2014-12-17 MED ORDER — METRONIDAZOLE 500 MG PO TABS: 500.0000 mg | ORAL_TABLET | Freq: Four times a day (QID) | ORAL | Status: AC

## 2014-12-17 NOTE — Progress Notes (Signed)
Patient ID: Darryl Griffith, male   DOB: April 01, 1945, 69 y.o.   MRN: [id number] Patient being discharged today Needs follow-up with Dr. Barry Dienes in 2-3 weeks.  [name]. Georgette Dover, MD, [facility]  General/ Trauma Surgery  12/17/2014 10:28 AM

## 2014-12-17 NOTE — Discharge Summary (Signed)
Physician Discharge Summary  Darryl Griffith UMP:[id number] DOB: Oct 03, 1945 DOA: 11/24/2014  PCP: Darryl Pac, MD  Admit date: 11/24/2014 Discharge date: 12/17/2014  Recommendations for Outpatient Follow-up:  1. Dr. Megan Griffith of infectious disease recommends repeat CT scan in the next 2-3 weeks on discharge to evaluate the drain and how long patient needs Flagyl and Levaquin. For now he recommends continuing Flagyl and Levaquin on discharge. Patient needs to follow-up with infectious disease, surgery. 2. Please look at the wound care section on dressing changes recommendations as recommended by wound care team. Please note dressing changes or Monday Wednesday and Friday. Drain needs to remain in place.    Discharge wound care:    Complete by:  As directed  Dressing procedure/placement/frequency:Cleanse abdominal wound with NS and pat dry. Apply white foam to wound bed, cover with black foam. Black foam only to smaller wound above umbilicus. Drape to intact skin between wounds for protection. Seal immediately achieved at 125 mmHg continuous therapy. Change Mon/Wed/Fri.       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 male with past  medical history of left flank liposarcoma S/P Resection and left nephrectomy 06/2014, status post radiation treatment completed 40/0867 complicated by an abscess formation (+VRE), s/p drain placement for ~ 3 weeks (removed 10/31/14). Pt was admitted on 11/24/14 with nausea and vomiting and was found to have SBO. General surgery was consulted on admission. Patient did not improve with conservative measures and underwent surgery 11/29/14: incision and drainage of left back abscess, exploratory laparotomy, lysis of adhesions, small bowel resection and placement of drain into left retroperitoneal abscess. Wound vac placed.Most recently he was on daptomycin and meropenem but now developed diarrhea and abx changed to levaquin and flagyl 12/15/2014.  Hospital Course:   Assessment/Plan:    Principal problem:  SBO status post resection with lysis of adhesions / postoperative ileus / left retroperitoneal abscess  - Patient admitted for small bowel obstruction. - Pt is s/p surgical repair on 8/31 with exploratory laparotomy, lysis of adhesions, small bowel resection, placement of drain into left retroperitoneal abscess. Wound opened 12/10/14.Wound vac placed subsequenlty.  - Wound care assessment (12/15/2014): Surgical midline abdominal incision site with measurement: 10 cm x 4.2 cm x 2 cm. Minimal serosanguinous drainage. Dressing procedure/placement/frequency: Cleansed abdominal wound with NS and patted dry. Applied white foam to wound bed, covered with black foam. Black foam placed only to smaller wound above umbilicus. Drape placed to intact skin between wounds for protection. - Dressing change Mon/Wed/Fri. - Abx changed to Levaquin and flagyl 12/15/2014. Please refer to antiinfectives section for abx given during this hospital stay.  - Patient will continue Flagyl and Levaquin until repeat CT scan and follow-up with infectious disease and surgery.  Active  problems:  Diarrhea - Spontaneously resolved.  Infectious disease recommends continuing Flagyl.  Acute pulmonary edema - BNP elevated at 103.  - Resolved with Lasix. - Respiratory status remains stable.  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   Left retroperitoneal and left upper quadrant fluid collection  - Cultures initially positive for VRE. Cultures from 11/29/14 negative. - Status post IR drainage 11/27/14. - Has completed meropenem (7 days), daptomycin (12 days)  Sacral pressure ulcer, stage 2  - Partial thickness loss of dermis with shallow open ulcer with red, pink wound bed without slough, red to purple over entire sacrum, two areas approximately 10 x 5 cm each on both buttocks - For now continue therapeutic mattress and pressure redistribution   Depression - In the setting of prolonged hospital stay. Patient is currently stable, does not feel depressed. - Continue Zoloft 25 mg daily on discharge  Essential hypertension  - Blood pressure at goal   Hypokalemia / Hypomagnesemia - Secondary to GI losses.  - Supplemented   Symptomatic Anemia / Acute postoperative blood loss anemia  - Has received 2 units of PRBCs 11/27/14, 1 unit 12/01/14, 1 unit 12/05/14 and 1 unit 12/08/14. - Hemoglobin was 7.8 on 12/15/2014 and 12/16/2014. He has received additional 1 unit of PRBC 12/16/2014.  Hyponatremia - Secondary to GI losses - Improved with fluids   Neutropenia, drug induced  - Secondary to Vioxx  - CBC stable   Failure to thrive in adult / Severe protein calorie malnutrition - In the context of prolonged illness - Continue Megace and nutritional supplementation as per nutritionist recommendation  DVT prophylaxis - Heparin subQ given in hospital   Code Status: Full Code Family Communication: Family not at the bedside this am; updated wife Darryl Griffith over the phone [phone] and [phone]; updated  Darryl Griffith as well who will take him to [facility]    IV access:  PICC line placed 10/12/14  Procedures and diagnostic studies in past 72 hours:   Dg Chest Port 1 View 12/10/2014 Increased bilateral upper lobe opacities are noted concerning for edema or possibly pneumonia. Increased mild left pleural effusion is noted.   Medical Consultants:  Infectious Disease: Carlyle Basques, MD General Surgery: Alphonsa Overall, MD  Other Consultants:  Nutrition Physical therapy Social work [place]  IAnti-Infectives:   Total days of antibiotics 63 Day 11 daptomycin. Stopped dapto today 9/14. Day 7 meropenem. Stopped 12/15/2014.  SignedLeisa Lenz, MD  [facility] 12/17/2014, 8:57 AM  Pager #: [phone]  Time spent in minutes: more than 30 minutes   Discharge Exam: Filed Vitals:   12/17/14 0522  BP: 160/90  Pulse: 108  Temp: 97.8 F (36.6 C)  Resp: 18   Filed Vitals:   12/16/14 1754 12/16/14 1829 12/16/14 2106 12/17/14 0522  BP: 137/82 137/83 171/99 160/90  Pulse: 107 109 120 108  Temp: 98.4 F (36.9 C) 98.9 F (37.2 C) 98.4 F (36.9 C) 97.8 F (36.6 C)  TempSrc: Oral Oral Oral Oral  Resp: 18 20 20 18   Height:      Weight:      SpO2: 99% 99% 100% 97%    General: Pt is alert, follows commands appropriately, not in acute distress Cardiovascular: Regular rate and rhythm, S1/S2 + Respiratory: Clear to auscultation bilaterally, no wheezing, no crackles, no rhonchi Abdominal: Soft, non tender, non distended, bowel sounds +, no guarding; wound vac in place  Extremities: no edema,  no cyanosis, pulses palpable bilaterally DP and PT Neuro: Grossly nonfocal  Discharge Instructions  Discharge Instructions    Diet - low sodium heart healthy    Complete by:  As directed      Discharge instructions    Complete by:  As directed   1. Dr. Megan Griffith of infectious disease recommends repeat CT scan in the next 2-3 weeks on discharge to evaluate the drain and how  long patient needs Flagyl and Levaquin. For now he recommends continuing Flagyl and Levaquin on discharge. Patient needs to follow-up with infectious disease, surgery. 2. Please look at the wound care section on dressing changes recommendations as recommended by wound care team. Please note dressing changes or Monday Wednesday and Friday. Drain needs to remain in place.     Discharge wound care:    Complete by:  As directed   Dressing procedure/placement/frequency:Cleanse abdominal wound with NS and pat dry. Apply white foam to wound bed, cover with black foam. Black foam only to smaller wound above umbilicus. Drape to intact skin between wounds for protection. Seal immediately achieved at 125 mmHg continuous therapy. Change Mon/Wed/Fri.     Increase activity slowly    Complete by:  As directed             Medication List    STOP taking these medications        linezolid 600 MG tablet  Commonly known as:  ZYVOX     LORazepam 1 MG tablet  Commonly known as:  ATIVAN  Replaced by:  LORazepam 2 MG/ML injection     Melatonin 5 MG Caps     pantoprazole 40 MG tablet  Commonly known as:  PROTONIX      TAKE these medications        acetaminophen 160 MG/5ML solution  Commonly known as:  TYLENOL  Take 20.3 mLs (650 mg total) by mouth every 4 (four) hours as needed for mild pain, headache or fever.     alum & mag hydroxide-simeth 200-200-20 MG/5ML suspension  Commonly known as:  MAALOX/MYLANTA  Take 30 mLs by mouth every 6 (six) hours as needed for indigestion or heartburn (or bloating).     baclofen 10 MG tablet  Commonly known as:  LIORESAL  Take 1 tablet (10 mg total) by mouth 3 (three) times daily as needed for muscle spasms.     clonazePAM 0.5 MG disintegrating tablet  Commonly known as:  KLONOPIN  Take 1 tablet (0.5 mg total) by mouth 2 (two) times daily as needed (anxiety).     famotidine 20 MG tablet  Commonly known as:  PEPCID  Take 1 tablet (20 mg total) by mouth 2  (two) times daily.     feeding supplement (ENSURE ENLIVE) Liqd  Take 237 mLs by mouth 2 (two) times daily between meals.     ferrous sulfate 325 (65 FE) MG tablet  TAKE 1 TABLET (325 MG TOTAL) BY MOUTH 2 (TWO) TIMES DAILY WITH A MEAL.     levofloxacin 500 MG tablet  Commonly known as:  LEVAQUIN  Take 1 tablet (500 mg total) by mouth daily.     LORazepam 2 MG/ML injection  Commonly known as:  ATIVAN  Inject 0.25 mLs (0.5 mg total) into the vein at bedtime as needed (sleep).     magic mouthwash Soln  Take 15 mLs by mouth 4 (four) times daily as needed for mouth pain (sore throat).     megestrol 400 MG/10ML suspension  Commonly known as:  MEGACE  Take 10 mLs (400 mg total) by mouth daily.     metroNIDAZOLE 500 MG tablet  Commonly known as:  FLAGYL  Take 1 tablet (500 mg total) by mouth every 6 (six) hours.     multivitamin with minerals Tabs tablet  Take 1 tablet by mouth every morning.     ondansetron 8 MG disintegrating tablet  Commonly known as:  ZOFRAN ODT  Take 1 tablet (8 mg total) by mouth 3 (three) times daily before meals.     oxyCODONE 5 MG immediate release tablet  Commonly known as:  Oxy IR/ROXICODONE  Take 1-2 tablets (5-10 mg total) by mouth every 4 (four) hours as needed for moderate pain, severe pain or breakthrough pain.     sertraline 25 MG tablet  Commonly known as:  ZOLOFT  Take 1 tablet (25 mg total) by mouth daily.     traZODone 50 MG tablet  Commonly known as:  DESYREL  Take 1 tablet (50 mg total) by mouth at bedtime.           Follow-up Information    Follow up with Darryl Pac, MD. Schedule an appointment as soon as possible for a visit in 2 weeks.   Specialty:  Family Medicine   Why:  Follow up appt after recent hospitalization   Contact information:   [place] [address] [phone]        The results of significant diagnostics from this hospitalization (including imaging, microbiology, ancillary and  laboratory) are listed below for reference.    Significant Diagnostic Studies: Ct Chest W Contrast  11/23/2014   CLINICAL DATA:  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  CONTRAST:  110mL OMNIPAQUE IOHEXOL 300 MG/ML  SOLN  COMPARISON:  11/07/2014  FINDINGS: CT CHEST FINDINGS  Thoracic inlet: No neck base or axillary masses or adenopathy. Normal thyroid.  Mediastinum and hila: Heart normal in size and configuration. Mild prominence of the ascending aorta measuring 4 cm in diameter. 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 Right PICC tip projects in the lower superior vena cava. No mediastinal or hilar masses or pathologically enlarged lymph nodes.  Lungs and pleura: No lung masses or suspicious nodules. Small left pleural effusion associated with mild dependent left lower lobe atelectasis. Small focus of reticular nodular opacity in the posterior right lower lobe is likely additional atelectasis or scarring. There is no lung consolidation or edema. No pneumothorax.  CT ABDOMEN AND PELVIS FINDINGS  There is a fluid collection that extends from the posterior medial left superior retroperitoneum, adjacent to the spleen, to the lateral margin of the superior left psoas muscle. This measures 4.9 x 2.2 cm transversely, previously 5.6 x 2.6 cm. It measures 7.6 cm an approximate superior inferior length, previously 9.2 cm. Fluid attenuation and multiple blastic and clips lie in the left upper quadrant between the posterior wall of the stomach and upper  abdominal aorta. There is thickening of this portion of the stomach wall. These changes partly surround the pancreatic tail and surround the left diaphragmatic crus. No left adrenal gland is visible. Left kidney  is surgically absent.  Liver and spleen:  Unremarkable.  Gallbladder and biliary tree: Unremarkable.  Pancreas: There is some heterogeneous lower attenuation along the pancreatic tail adjacent to the left upper quadrant surgical changes and low-attenuation soft tissue. This is similar to the prior exam, likely reactive edema. No other pancreatic abnormality.  Right adrenal glands: Normal.  Right kidney, ureter, bladder: 15 mm midpole cyst. No other right renal masses, no stones and no hydronephrosis. Normal ureters. Unremarkable bladder.  Lymph nodes:  No pathologically enlarged lymph nodes.  Ascites:  Trace ascites in the pelvis.  Gastrointestinal: There is a partial small bowel obstruction. The transition point is in the left upper adjacent to the postsurgical changes and residual left retroperitoneal fluid collection. Small bowel proximal to the transition is dilated to a maximum of 5.3 cm. Small bowel distal to this is decompressed. The colon is partly decompressed. There are scattered colonic diverticula mostly along the sigmoid colon. No diverticulitis. Normal-sized appendix is visualized. No bowel wall thickening or inflammatory changes. No free air.  Abdominal wall: There is edema in the subcutaneous fat mostly along the flanks. There is midline abdominal incision. No hernias.  Vascular: Atherosclerotic changes are noted along the infrarenal abdominal aorta and branch vessels. No aneurysm.  MUSCULOSKELETAL FINDINGS  No osteoblastic or osteolytic lesions. Degenerative changes noted of the visualized spine. Bones are demineralized.  IMPRESSION: 1. Residual left retroperitoneal and left upper quadrant collection associated with postsurgical changes. The defined residual collection, along the posterior  medial left retroperitoneum abutting the spleen and left psoas muscle, is smaller, now measuring 4.9 x 2.3 x 7.6 cm, previously 5.6 x 2.6 x 9.2 cm. Left upper quadrant postsurgical and inflammatory changes include thickening of the posterior wall of the stomach, stable. There are numerous vascular clips in the left retroperitoneum and left posterior medial upper quadrant. 2. Fairly high-grade partial small bowel obstruction with the transition point in the posterior medial left upper quadrant adjacent to the residual collection and postsurgical changes. This is similar to prior study. 3. No convincing local residual retroperitoneal sarcoma. 4. No evidence of metastatic disease from the sarcoma.   Electronically Signed   By: Lajean Manes M.D.   On: 11/23/2014 14:36   Ct Abdomen Pelvis W Contrast  11/23/2014   CLINICAL DATA:  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  CONTRAST:  158mL OMNIPAQUE IOHEXOL 300 MG/ML  SOLN  COMPARISON:  11/07/2014  FINDINGS: CT CHEST FINDINGS  Thoracic inlet: No neck base or axillary masses or adenopathy. Normal thyroid.  Mediastinum and hila: Heart normal in size and configuration. Mild prominence of the ascending aorta measuring 4 cm in diameter. 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 Right PICC tip projects in the lower superior vena cava. No mediastinal or hilar masses or pathologically enlarged lymph nodes.  Lungs and pleura: No lung masses or suspicious nodules. Small left pleural effusion associated with mild  dependent left lower lobe atelectasis. Small focus of reticular nodular opacity in the posterior right lower lobe is likely additional atelectasis or scarring. There is no lung consolidation or edema. No pneumothorax.  CT ABDOMEN  AND PELVIS FINDINGS  There is a fluid collection that extends from the posterior medial left superior retroperitoneum, adjacent to the spleen, to the lateral margin of the superior left psoas muscle. This measures 4.9 x 2.2 cm transversely, previously 5.6 x 2.6 cm. It measures 7.6 cm an approximate superior inferior length, previously 9.2 cm. Fluid attenuation and multiple blastic and clips lie in the left upper quadrant between the posterior wall of the stomach and upper abdominal aorta. There is thickening of this portion of the stomach wall. These changes partly surround the pancreatic tail and surround the left diaphragmatic crus. No left adrenal gland is visible. Left kidney is surgically absent.  Liver and spleen:  Unremarkable.  Gallbladder and biliary tree: Unremarkable.  Pancreas: There is some heterogeneous lower attenuation along the pancreatic tail adjacent to the left upper quadrant surgical changes and low-attenuation soft tissue. This is similar to the prior exam, likely reactive edema. No other pancreatic abnormality.  Right adrenal glands: Normal.  Right kidney, ureter, bladder: 15 mm midpole cyst. No other right renal masses, no stones and no hydronephrosis. Normal ureters. Unremarkable bladder.  Lymph nodes:  No pathologically enlarged lymph nodes.  Ascites:  Trace ascites in the pelvis.  Gastrointestinal: There is a partial small bowel obstruction. The transition point is in the left upper adjacent to the postsurgical changes and residual left retroperitoneal fluid collection. Small bowel proximal to the transition is dilated to a maximum of 5.3 cm. Small bowel distal to this is decompressed. The colon is partly decompressed. There are scattered colonic diverticula  mostly along the sigmoid colon. No diverticulitis. Normal-sized appendix is visualized. No bowel wall thickening or inflammatory changes. No free air.  Abdominal wall: There is edema in the subcutaneous fat mostly along the flanks. There is midline abdominal incision. No hernias.  Vascular: Atherosclerotic changes are noted along the infrarenal abdominal aorta and branch vessels. No aneurysm.  MUSCULOSKELETAL FINDINGS  No osteoblastic or osteolytic lesions. Degenerative changes noted of the visualized spine. Bones are demineralized.  IMPRESSION: 1. Residual left retroperitoneal and left upper quadrant collection associated with postsurgical changes. The defined residual collection, along the posterior medial left retroperitoneum abutting the spleen and left psoas muscle, is smaller, now measuring 4.9 x 2.3 x 7.6 cm, previously 5.6 x 2.6 x 9.2 cm. Left upper quadrant postsurgical and inflammatory changes include thickening of the posterior wall of the stomach, stable. There are numerous vascular clips in the left retroperitoneum and left posterior medial upper quadrant. 2. Fairly high-grade partial small bowel obstruction with the transition point in the posterior medial left upper quadrant adjacent to the residual collection and postsurgical changes. This is similar to prior study. 3. No convincing local residual retroperitoneal sarcoma. 4. No evidence of metastatic disease from the sarcoma.   Electronically Signed   By: Lajean Manes M.D.   On: 11/23/2014 14:36   Ct Aspiration  11/28/2014   CLINICAL DATA:  Recurrent left retroperitoneal fluid collection after previous resection of sarcoma. The patient now presents with a small bowel obstruction. Aspiration of the fluid collection has been requested prior to planned surgery to determine if there is any evidence of infection. This fluid collection was last aspirated on 11/10/2014 revealing vancomycin resistant Enterococcus and also previously contained a  percutaneous drain for several months.  EXAM: CT GUIDED CATHETER DRAINAGE OF LEFT RETROPERITONEAL FLUID COLLECTION  ANESTHESIA/SEDATION: 1.5  Mg IV Versed; 75 mcg IV Fentanyl  Total Moderate Sedation Time: 15 minutes  PROCEDURE: The procedure risks, benefits,  and alternatives were explained to the patient. Questions regarding the procedure were encouraged and answered. The patient understands and consents to the procedure. A time-out was performed prior to the procedure.  The left flank region was prepped with Betadine in a sterile fashion, and a sterile drape was applied covering the operative field. A sterile gown and sterile gloves were used for the procedure. Local anesthesia was provided with 1% Lidocaine.  Under CT guidance, a 5 [place] Yueh centesis catheter was advanced into the left retroperitoneal space. Fluid aspiration was performed through the 5 French catheter. The catheter was then removed. Additional CT was performed.  COMPLICATIONS: None  FINDINGS: Recurrent fluid collection in the medial left retroperitoneal space again identified, identical in appearance to scanning just prior to the previous aspiration procedure on 08/12. Approximately 10 mL of pink colored, turbid fluid was able to be aspirated. Material was sent for culture analysis. Postprocedural CT shows decrease in size of the fluid collection.  IMPRESSION: CT-guided aspiration of recurrent medial left retroperitoneal fluid collection yielding 10 mL of pink colored, turbid fluid. Fluid was sent for culture analysis.   Electronically Signed   By: Aletta Edouard M.D.   On: 11/28/2014 08:21   Ir Venipuncture 52yrs/older By Md  12/01/2014   CLINICAL DATA:  Anemia, difficult access, poor peripheral veins, access for blood transfusion  EXAM: ULTRASOUND GUIDANCE FOR VASCULAR ACCESS  PERIPHERAL IV START BY PHYSICIAN.  Date:  9/2/20169/05/2014 5:13 pm  Radiologist:  M. Daryll Brod, MD  Guidance:  Ultrasound  FLUOROSCOPY TIME:  None.  MEDICATIONS  AND MEDICAL HISTORY: None.  ANESTHESIA/SEDATION: None.  CONTRAST:  None.  COMPLICATIONS: None immediate  PROCEDURE: Informed consent was obtained from the patient following explanation of the procedure, risks, benefits and alternatives. The patient understands, agrees and consents for the procedure. All questions were addressed. A time out was performed.  Maximal barrier sterile technique utilized including caps, mask, sterile gowns, sterile gloves, large sterile drape, hand hygiene, and ChloraPrep  Preliminary ultrasound performed. Left basilic vein demonstrated. Under sterile conditions and local anesthesia, a 16 gauge Angiocath was advanced into the left basilic vein under ultrasound. Position confirmed with ultrasound. Images obtained for documentation. Blood aspirated easily followed by saline flush. This was secured externally with a sterile dressing. Ready for use.  IMPRESSION: Successful ultrasound left upper extremity basilic vein peripheral IV start.   Electronically Signed   By: [name] M.D.   On: 12/01/2014 17:19   Ir US Guide Vasc Access Left  12/01/2014   CLINICAL DATA:  Anemia, difficult access, poor peripheral veins, access for blood transfusion  EXAM: ULTRASOUND GUIDANCE FOR VASCULAR ACCESS  PERIPHERAL IV START BY PHYSICIAN.  Date:  9/2/20169/05/2014 5:13 pm  Radiologist:  M. Daryll Brod, MD  Guidance:  Ultrasound  FLUOROSCOPY TIME:  None.  MEDICATIONS AND MEDICAL HISTORY: None.  ANESTHESIA/SEDATION: None.  CONTRAST:  None.  COMPLICATIONS: None immediate  PROCEDURE: Informed consent was obtained from the patient following explanation of the procedure, risks, benefits and alternatives. The patient understands, agrees and consents for the procedure. All questions were addressed. A time out was performed.  Maximal barrier sterile technique utilized including caps, mask, sterile gowns, sterile gloves, large sterile drape, hand hygiene, and ChloraPrep  Preliminary ultrasound performed. Left basilic  vein demonstrated. Under sterile conditions and local anesthesia, a 16 gauge Angiocath was advanced into the left basilic vein under ultrasound. Position confirmed with ultrasound. Images obtained for documentation. Blood aspirated easily followed by saline flush. This was secured  externally with a sterile dressing. Ready for use.  IMPRESSION: Successful ultrasound left upper extremity basilic vein peripheral IV start.   Electronically Signed   By: [name] M.D.   On: 12/01/2014 17:19   Dg Chest Port 1 View  12/10/2014   CLINICAL DATA:  Shortness of breath, tachycardia.  EXAM: PORTABLE CHEST - 1 VIEW  COMPARISON:  July 13, 2014.  FINDINGS: Stable cardiomediastinal silhouette. No pneumothorax is noted. Increased bilateral upper lobe opacities are noted concerning for pneumonia or possibly edema. Increased mild left pleural effusion is noted. Right-sided PICC line is noted with distal tip overlying expected position of the SVC. Bony thorax is intact.  IMPRESSION: Increased bilateral upper lobe opacities are noted concerning for edema or possibly pneumonia. Increased mild left pleural effusion is noted.   Electronically Signed   By: Marijo Conception, M.D.   On: 12/10/2014 16:53   Dg Abd 2 Views  11/27/2014   CLINICAL DATA:  Followup small bowel obstruction  EXAM: ABDOMEN - 2 VIEW  COMPARISON:  11/26/2014  FINDINGS: The nasogastric tube is identified with tip in the stomach. The side port is below the GE junction. Dilated loops of small bowel and air-fluid levels are again noted. These measure up to 6.7 cm. Enteric contrast material is identified within the colon up to the level of the rectum.  IMPRESSION: 1. NG tube is within the stomach. 2. Persistent partial small bowel obstruction 3. Enteric contrast material is identified up the level of the rectum.   Electronically Signed   By: Kerby Moors M.D.   On: 11/27/2014 09:35   Dg Abd 2 Views  11/26/2014   CLINICAL DATA:  Abdominal distention with nausea and  vomiting. Surgery in April. Small bowel obstruction symptoms.  EXAM: ABDOMEN - 2 VIEW  COMPARISON:  11/24/2014  FINDINGS: Upright and supine views. Upright view demonstrates persistent small bowel air-fluid levels, increased. Small left pleural effusion. There may be a nasogastric tube looped in the esophagus, incompletely imaged. Surgical clips in the upper abdomen. A metallic object projecting over the a lower chest is presumably external to the patient.  Supine images demonstrate small bowel distension. Example loop measures 6.7 cm today, versus 6.4 cm on the prior. Contrast is identified within the colon. No pneumatosis.  IMPRESSION: Similar small bowel obstruction pattern, without free intraperitoneal air or other acute complication.  The nasogastric may be looped in the distal esophagus. Incompletely imaged. These results will be called to the ordering clinician or representative by the Radiologist Assistant, and communication documented in the PACS or zVision Dashboard.   Electronically Signed   By: Abigail Miyamoto M.D.   On: 11/26/2014 10:07   Dg Abd Acute W/chest  11/24/2014   CLINICAL DATA:  Bloating and nausea since yesterday. Vomiting today. History of bowel obstructions. History of retroperitoneal liposarcoma.  EXAM: DG ABDOMEN ACUTE W/ 1V CHEST  COMPARISON:  CT abdomen and pelvis 11/23/2014  FINDINGS: Right PICC catheter tip over the low SVC region. No pneumothorax. Linear fibrosis or atelectasis in the left lung base. No focal airspace disease or consolidation in the lungs. Normal heart size and pulmonary vascularity.  Surgical clips in the upper abdomen. Contrast material is demonstrated in the right colon. There are prominent gas-filled distended small bowel loops in the mid abdomen and multiple air-fluid levels are demonstrated on the upright view. Appearance is consistent with mechanical small bowel obstruction. No free intra-abdominal air. No radiopaque stones. Degenerative changes in the spine.   IMPRESSION: No  evidence of active pulmonary disease. Fibrosis or atelectasis in the left lung base.  Gaseous distention of mid abdominal small bowel with multiple air-fluid levels consistent with mechanical small bowel obstruction.   Electronically Signed   By: Lucienne Capers M.D.   On: 11/24/2014 21:51   Dg Abd Portable 1v  11/28/2014   CLINICAL DATA:  Evaluate NG tube repositioning.  EXAM: PORTABLE ABDOMEN - 1 VIEW  COMPARISON:  11/27/2014  FINDINGS: The nasogastric tube has been repositioned. The tip is in the fundus of the stomach in the side port is below the GE junction. Surgical clips are noted in the left upper quadrant of the abdomen. Again noted are dilated loops of bowel measuring up to 5 cm.  IMPRESSION: 1. Nasogastric tube is in the stomach. 2. Persistent small bowel dilatation consistent with obstruction.   Electronically Signed   By: Kerby Moors M.D.   On: 11/28/2014 09:05   Dg Abd Portable 1v  11/26/2014   CLINICAL DATA:  Nasogastric tube placement  EXAM: PORTABLE ABDOMEN - 1 VIEW  COMPARISON:  Portable exam 1318 hours compared to 11/26/2014  FINDINGS: Tip of nasogastric tube projects over proximal stomach.  Numerous surgical clips in the perigastric region.  Multiple dilated loops of small bowel again identified compatible with small bowel obstruction.  Small amount of contrast within a decompressed colon.  No definite bowel wall thickening.  Lung bases clear.  IMPRESSION: Small bowel obstruction.  Tip of nasogastric tube projects over proximal stomach.   Electronically Signed   By: Lavonia Dana M.D.   On: 11/26/2014 16:49    Microbiology: Recent Results (from the past 240 hour(s))  C difficile quick scan w PCR reflex     Status: None   Collection Time: 12/16/14  3:29 PM  Result Value Ref Range Status   C Diff antigen NEGATIVE NEGATIVE Final   C Diff toxin NEGATIVE NEGATIVE Final   C Diff interpretation Negative for toxigenic C. difficile  Final     Labs: Basic Metabolic  Panel:  Recent Labs Lab 12/10/14 1701 12/11/14 0610 12/14/14 0520 12/15/14 0620 12/16/14 0900  NA 130* 130* 135 134* 134*  K 5.1 4.8 5.0 4.8 4.6  CL 105 101 107 105 104  CO2 20* 19* 22 23 23   GLUCOSE 154* 148* 136* 117* 123*  BUN 30* 32* 30* 29* 31*  CREATININE 0.99 1.09 0.94 1.06 1.04  CALCIUM 7.7* 8.2* 8.6* 8.7* 9.0  MG  --  1.9 2.0  --   --   PHOS  --  3.9 4.2  --   --    Liver Function Tests:  Recent Labs Lab 12/11/14 0610 12/14/14 0520  AST 17 16  ALT 9* 9*  ALKPHOS 111 134*  BILITOT 0.4 0.4  PROT 5.9* 5.9*  ALBUMIN 2.5* 2.2*   No results for input(s): LIPASE, AMYLASE in the last 168 hours. No results for input(s): AMMONIA in the last 168 hours. CBC:  Recent Labs Lab 12/10/14 1701 12/11/14 0610 12/14/14 0520 12/15/14 0620 12/16/14 0900  WBC 7.1 8.3 10.4 11.6* 10.8*  NEUTROABS  --  6.3  --   --   --   HGB 8.3* 9.0* 8.0* 7.8* 7.8*  HCT 25.3* 27.4* 24.7* 24.3* 25.0*  MCV 84.9 84.3 84.6 84.7 85.0  PLT 558* 638* 704* 811* 807*   Cardiac Enzymes:  Recent Labs Lab 12/11/14 0610  CKTOTAL 13*   BNP: BNP (last 3 results)  Recent Labs  12/10/14 1701  BNP 103.3*  ProBNP (last 3 results) No results for input(s): PROBNP in the last 8760 hours.  CBG:  Recent Labs Lab 12/14/14 1802 12/15/14 0543 12/15/14 1124 12/15/14 1726 12/16/14 0603  GLUCAP 125* 109* 131* 138* 114*

## 2014-12-17 NOTE — Clinical Social Work Placement (Signed)
   CLINICAL SOCIAL WORK PLACEMENT  NOTE  Date:  12/17/2014  Patient Details  Name: Darryl Griffith MRN: [id number] Date of Birth: 30-Sep-1945  Clinical Social Work is seeking post-discharge placement for this patient at the [place] level of care (*CSW will initial, date and re-position this form in  chart as items are completed):  Yes   Patient/family provided with [facility] Work Department's list of facilities offering this level of care within the geographic area requested by the patient (or if unable, by the patient's family).  Yes   Patient/family informed of their freedom to choose among providers that offer the needed level of care, that participate in Medicare, Medicaid or managed care program needed by the patient, have an available bed and are willing to accept the patient.  Yes   Patient/family informed of [facility]'s ownership interest in [facility] and [facility], as well as of the fact that they are under no obligation to receive care at these facilities.  PASRR submitted to EDS on       PASRR number received on       Existing PASRR number confirmed on       FL2 transmitted to all facilities in geographic area requested by pt/family on       FL2 transmitted to all facilities within larger geographic area on       Patient informed that his/her managed care company has contracts with or will negotiate with certain facilities, including the following:            Patient/family informed of bed offers received.  Patient chooses bed at  [facility]     Physician recommends and patient chooses bed at      Patient to be transferred to  [facility] on  .December 17, 2014  Patient to be transferred to facility by   daughters [name] and gina    Patient family notified on   December 17, 2014 of transfer.  Name of family member notified:      [name] and gina/daughters  PHYSICIAN       Additional  Comment:    _______________________________________________ Carlean Jews, LCSW 12/17/2014, 12:01 PM

## 2014-12-17 NOTE — Discharge Instructions (Signed)
Vacuum-Assisted Closure Therapy Vacuum-assisted closure (VAC) therapy uses a device that removes fluid and germs from wounds to help them heal. It is used on wounds that cannot be closed with stitches. They often heal slowly. Vacuum-assisted therapy helps the wound stay clean and healthy while the open wound slowly grows back together. Vacuum-assisted closure therapy uses a bandage (dressing) that is made of foam. It is put inside the wound. Then, a drape is placed over the wound. This drape sticks to your skin to keep air out, and to protect the wound. A tube is hooked up to a small pump and is attached to the drape. The pump sucks out the fluid and germs. Vacuum-assisted closure therapy can also help reduce the bad smell that comes from the wound. HOW DOES IT WORK?  The vacuum pump pulls fluid through the foam dressing. The dressing may wrinkle during this process. The fluid goes into the tube and away from the wound. The fluid then goes into a container. The fluid in the container must be replaced if it is full or at least once a week, even if the container is not full. The pulling from the pump helps to close the wound and bring better circulation to the wound area. The foam dressing covers and protects the wound. It helps your wound heal faster.  HOW DOES IT FEEL?   You might feel a little pulling when the pump is on.  You might also feel a mild vibrating sensation.  You might feel some discomfort when the dressing is taken off. CAN I MOVE AROUND WITH VACUUM-ASSISTED CLOSURE THERAPY? Yes, it has a backup battery which is used when the machine is not plugged in, as long as the battery is working, you can move freely. WHAT ARE SOME THINGS I MUST KNOW?  Do not turn off the pump yourself, unless instructed to do so by your healthcare provider, such as for bathing.  Do not take off the dressing yourself, unless instructed to do so by your caregiver.  You can wash or shower with the dressing.  However, do not take the pump into the shower. Make sure the wound dressing is protected and covered with plastic. The wound area must stay dry.  Do not turn off the pump for more than 2 hours. If the pump is off for more than 2 hours, your nurse must change your dressing.  Check frequently that the machine is on, that the machine indicates the therapy is on, and that all clamps are open. THE ALARM IS SOUNDING! WHAT SHOULD I DO?   Stay calm.  Do not turn off the pump or do anything with the dressing.  Call your clinic or caregiver right away if the alarm goes off and you cannot fix the problem. Some reasons the alarm might go off include:  The fluid collection container is full.  The battery is low.  The dressing has a leak.  Explain to your caregiver what is happening. Follow the instructions you receive. WHEN SHOULD I CALL FOR HELP?   You have severe pain.  You have difficulty breathing.  You have bleeding that will not stop.  Your wound smells bad.  You have redness, swelling, or fluid leaking from your wound.  Your alarm goes off and you do not know what to do.  You have a fever.  Your wound itches severely.  Your dressing changes are often painful or bleeding often occurs.  You have diarrhea.  You have a sore throat.  You have a rash around the dressing or anywhere else on your body.  You feel nauseous.  You feel dizzy or weak.  The [facility] machine has been off for more than 2 hours. HOW DO I GET READY TO GO HOME WITH A PUMP?  A trained caregiver will talk to you and answer your questions about your vacuum-assisted closure therapy before you go home. He or she will explain what to expect. A caregiver will come to your home to apply the pump and care for your wound. The at-home caregiver will be available for questions and will come back for the scheduled dressing changes, usually every 48-72 hours (or more often for severely infected wounds). Your at-home  caregiver will also come if you are having an unexpected problem. If you have questions or do not know what to do when you go home, talk to your healthcare provider. Document Released: 02/28/2008 Document Revised: 11/17/2012 Document Reviewed: 02/28/2011 [facility] Patient Information 2015 [place], [place]. This information is not intended to replace advice given to you by your health care provider. Make sure you discuss any questions you have with your health care provider. Metronidazole tablets or capsules What is this medicine? METRONIDAZOLE (me troe NI da zole) is an antiinfective. It is used to treat certain kinds of bacterial and protozoal infections. It will not work for colds, flu, or other viral infections. This medicine may be used for other purposes; ask your health care provider or pharmacist if you have questions. COMMON BRAND NAME(S): Flagyl What should I tell my health care provider before I take this medicine? They need to know if you have any of these conditions: -anemia or other blood disorders -disease of the nervous system -fungal or yeast infection -if you drink alcohol containing drinks -liver disease -seizures -an unusual or allergic reaction to metronidazole, or other medicines, foods, dyes, or preservatives -pregnant or trying to get pregnant -breast-feeding How should I use this medicine? Take this medicine by mouth with a full glass of water. Follow the directions on the prescription label. Take your medicine at regular intervals. Do not take your medicine more often than directed. Take all of your medicine as directed even if you think you are better. Do not skip doses or stop your medicine early. Talk to your pediatrician regarding the use of this medicine in children. Special care may be needed. Overdosage: If you think you have taken too much of this medicine contact a poison control center or emergency room at once. NOTE: This medicine is only for you. Do not share  this medicine with others. What if I miss a dose? If you miss a dose, take it as soon as you can. If it is almost time for your next dose, take only that dose. Do not take double or extra doses. What may interact with this medicine? Do not take this medicine with any of the following medications: -alcohol or any product that contains alcohol -amprenavir oral solution -cisapride -disulfiram -dofetilide -dronedarone -paclitaxel injection -pimozide -ritonavir oral solution -sertraline oral solution -sulfamethoxazole-trimethoprim injection -thioridazine -ziprasidone This medicine may also interact with the following medications: -birth control pills -cimetidine -lithium -other medicines that prolong the QT interval (cause an abnormal heart rhythm) -phenobarbital -phenytoin -warfarin This list may not describe all possible interactions. Give your health care provider a list of all the medicines, herbs, non-prescription drugs, or dietary supplements you use. Also tell them if you smoke, drink alcohol, or use illegal drugs. Some items may interact with your  medicine. What should I watch for while using this medicine? Tell your doctor or health care professional if your symptoms do not improve or if they get worse. You may get drowsy or dizzy. Do not drive, use machinery, or do anything that needs mental alertness until you know how this medicine affects you. Do not stand or sit up quickly, especially if you are an older patient. This reduces the risk of dizzy or fainting spells. Avoid alcoholic drinks while you are taking this medicine and for three days afterward. Alcohol may make you feel dizzy, sick, or flushed. If you are being treated for a sexually transmitted disease, avoid sexual contact until you have finished your treatment. Your sexual partner may also need treatment. What side effects may I notice from receiving this medicine? Side effects that you should report to your doctor  or health care professional as soon as possible: -allergic reactions like skin rash or hives, swelling of the face, lips, or tongue -confusion, clumsiness -difficulty speaking -discolored or sore mouth -dizziness -fever, infection -numbness, tingling, pain or weakness in the hands or feet -trouble passing urine or change in the amount of urine -redness, blistering, peeling or loosening of the skin, including inside the mouth -seizures -unusually weak or tired -vaginal irritation, dryness, or discharge Side effects that usually do not require medical attention (report to your doctor or health care professional if they continue or are bothersome): -diarrhea -headache -irritability -metallic taste -nausea -stomach pain or cramps -trouble sleeping This list may not describe all possible side effects. Call your doctor for medical advice about side effects. You may report side effects to FDA at 1-800-FDA-1088. Where should I keep my medicine? Keep out of the reach of children. Store at room temperature below 25 degrees C (77 degrees F). Protect from light. Keep container tightly closed. Throw away any unused medicine after the expiration date. NOTE: This sheet is a summary. It may not cover all possible information. If you have questions about this medicine, talk to your doctor, pharmacist, or health care provider.  2015, [employer]. (2012-10-22 14:08:39) Levofloxacin tablets What is this medicine? LEVOFLOXACIN (lee voe FLOX a sin) is a quinolone antibiotic. It is used to treat certain kinds of bacterial infections. It will not work for colds, flu, or other viral infections. This medicine may be used for other purposes; ask your health care provider or pharmacist if you have questions. COMMON BRAND NAME(S): Levaquin, Levaquin Leva-Pak What should I tell my health care provider before I take this medicine? They need to know if you have any of these conditions: -cerebral  disease -irregular heartbeat -kidney disease -seizure disorder -an unusual or allergic reaction to levofloxacin, other antibiotics or medicines, foods, dyes, or preservatives -pregnant or trying to get pregnant -breast-feeding How should I use this medicine? Take this medicine by mouth with a full glass of water. Follow the directions on the prescription label. This medicine can be taken with or without food. Take your medicine at regular intervals. Do not take your medicine more often than directed. Do not skip doses or stop your medicine early even if you feel better. Do not stop taking except on your doctor's advice. A special MedGuide will be given to you by the pharmacist with each prescription and refill. Be sure to read this information carefully each time. Talk to your pediatrician regarding the use of this medicine in children. While this drug may be prescribed for children as young as 6 months for selected conditions, precautions  do apply. Overdosage: If you think you have taken too much of this medicine contact a poison control center or emergency room at once. NOTE: This medicine is only for you. Do not share this medicine with others. What if I miss a dose? If you miss a dose, take it as soon as you remember. If it is almost time for your next dose, take only that dose. Do not take double or extra doses. What may interact with this medicine? Do not take this medicine with any of the following medications: -arsenic trioxide -chloroquine -droperidol -medicines for irregular heart rhythm like amiodarone, disopyramide, dofetilide, flecainide, quinidine, procainamide, sotalol -some medicines for depression or mental problems like phenothiazines, pimozide, and ziprasidone This medicine may also interact with the following medications: -amoxapine -antacids -birth control pills -cisapride -dairy products -didanosine (ddI) buffered tablets or  powder -haloperidol -multivitamins -NSAIDS, medicines for pain and inflammation, like ibuprofen or naproxen -retinoid products like tretinoin or isotretinoin -risperidone -some other antibiotics like clarithromycin or erythromycin -sucralfate -theophylline -warfarin This list may not describe all possible interactions. Give your health care provider a list of all the medicines, herbs, non-prescription drugs, or dietary supplements you use. Also tell them if you smoke, drink alcohol, or use illegal drugs. Some items may interact with your medicine. What should I watch for while using this medicine? Tell your doctor or health care professional if your symptoms do not improve or if they get worse. Drink several glasses of water a day and cut down on drinks that contain caffeine. You must not get dehydrated while taking this medicine. You may get drowsy or dizzy. Do not drive, use machinery, or do anything that needs mental alertness until you know how this medicine affects you. Do not sit or stand up quickly, especially if you are an older patient. This reduces the risk of dizzy or fainting spells. This medicine can make you more sensitive to the sun. Keep out of the sun. If you cannot avoid being in the sun, wear protective clothing and use a sunscreen. Do not use sun lamps or tanning beds/booths. Contact your doctor if you get a sunburn. If you are a diabetic monitor your blood glucose carefully. If you get an unusual reading stop taking this medicine and call your doctor right away. Do not treat diarrhea with over-the-counter products. Contact your doctor if you have diarrhea that lasts more than 2 days or if the diarrhea is severe and watery. Avoid antacids, calcium, iron, and zinc products for 2 hours before and 2 hours after taking a dose of this medicine. What side effects may I notice from receiving this medicine? Side effects that you should report to your doctor or health care professional  as soon as possible: -allergic reactions like skin rash or hives, swelling of the face, lips, or tongue -changes in vision -confusion, nightmares or hallucinations -difficulty breathing -irregular heartbeat, chest pain -joint, muscle or tendon pain -pain or difficulty passing urine -persistent headache with or without blurred vision -redness, blistering, peeling or loosening of the skin, including inside the mouth -seizures -unusual pain, numbness, tingling, or weakness -vaginal irritation, discharge Side effects that usually do not require medical attention (report to your doctor or health care professional if they continue or are bothersome): -diarrhea -dry mouth -headache -stomach upset, nausea -trouble sleeping This list may not describe all possible side effects. Call your doctor for medical advice about side effects. You may report side effects to FDA at 1-800-FDA-1088. Where should I keep my  medicine? Keep out of the reach of children. Store at room temperature between 15 and 30 degrees C (59 and 86 degrees F). Keep in a tightly closed container. Throw away any unused medicine after the expiration date. NOTE: This sheet is a summary. It may not cover all possible information. If you have questions about this medicine, talk to your doctor, pharmacist, or health care provider.  2015, [employer]. (2012-10-22 07:45:07)

## 2014-12-18 ENCOUNTER — Telehealth: Payer: Self-pay

## 2014-12-18 LAB — TYPE AND SCREEN
ABO/RH(D): O NEG
ANTIBODY SCREEN: NEGATIVE
UNIT DIVISION: 0

## 2014-12-18 NOTE — Telephone Encounter (Signed)
Patient's daughter Darryl Griffith ) is calling regarding ID referral for Father who was discharged to skilled facility in [facility] in [place].   Daughter says she spoke with  Dr Megan Salon and gave names of  three ID physicians.  At the time it was not sure if he needed to continue with ID .  Please advise.

## 2014-12-18 NOTE — Discharge Summary (Signed)
Physician Discharge Summary  Darryl Griffith IDP:[id number] DOB: Mar 17, 1946 DOA: 11/24/2014  PCP: Gennette Pac, MD  Admit date: 11/24/2014 Discharge date: 12/18/2014  Recommendations for Outpatient Follow-up:  1. Dr. Megan Salon of infectious disease recommends repeat CT scan in the next 2-3 weeks on discharge to evaluate the drain and how long patient needs Flagyl and Levaquin. For now he recommends continuing Flagyl and Levaquin on discharge. Patient needs to follow-up with infectious disease, surgery. 2. Please look at the wound care section on dressing changes recommendations as recommended by wound care team. Please note dressing changes or Monday Wednesday and Friday. Drain needs to remain in place.    Discharge wound care:    Complete by:  As directed  Dressing procedure/placement/frequency:Cleanse abdominal wound with NS and pat dry. Apply white foam to wound bed, cover with black foam. Black foam only to smaller wound above umbilicus. Drape to intact skin between wounds for protection. Seal immediately achieved at 125 mmHg continuous therapy. Change Mon/Wed/Fri.       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 male with past  medical history of left flank liposarcoma S/P Resection and left nephrectomy 06/2014, status post radiation treatment completed 44/3154 complicated by an abscess formation (+VRE), s/p drain placement for ~ 3 weeks (removed 10/31/14). Pt was admitted on 11/24/14 with nausea and vomiting and was found to have SBO. General surgery was consulted on admission. Patient did not improve with conservative measures and underwent surgery 11/29/14: incision and drainage of left back abscess, exploratory laparotomy, lysis of adhesions, small bowel resection and placement of drain into left retroperitoneal abscess. Wound vac placed.Most recently he was on daptomycin and meropenem but now developed diarrhea and abx changed to levaquin and flagyl 12/15/2014.  Hospital Course:   Assessment/Plan:    Principal problem:  SBO status post resection with lysis of adhesions / postoperative ileus / left retroperitoneal abscess  - Patient admitted for small bowel obstruction. - Pt is s/p surgical repair on 8/31 with exploratory laparotomy, lysis of adhesions, small bowel resection, placement of drain into left retroperitoneal abscess. Wound opened 12/10/14.Wound vac placed subsequenlty.  - Wound care assessment (12/15/2014): Surgical midline abdominal incision site with measurement: 10 cm x 4.2 cm x 2 cm. Minimal serosanguinous drainage. Dressing procedure/placement/frequency: Cleansed abdominal wound with NS and patted dry. Applied white foam to wound bed, covered with black foam. Black foam placed only to smaller wound above umbilicus. Drape placed to intact skin between wounds for protection. - Dressing change Mon/Wed/Fri. - Abx changed to Levaquin and flagyl 12/15/2014. Please refer to antiinfectives section for abx given during this hospital stay.  - Patient will continue Flagyl and Levaquin until repeat CT scan and follow-up with infectious disease and surgery.  Active  problems:  Diarrhea - Spontaneously resolved.  Infectious disease recommends continuing Flagyl.  Acute pulmonary edema - BNP elevated at 103.  - Resolved with Lasix. - Respiratory status remains stable.  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   Left retroperitoneal and left upper quadrant fluid collection  - Cultures initially positive for VRE. Cultures from 11/29/14 negative. - Status post IR drainage 11/27/14. - Has completed meropenem (7 days), daptomycin (12 days)  Sacral pressure ulcer, stage 2  - Partial thickness loss of dermis with shallow open ulcer with red, pink wound bed without slough, red to purple over entire sacrum, two areas approximately 10 x 5 cm each on both buttocks - For now continue therapeutic mattress and pressure redistribution   Depression - In the setting of prolonged hospital stay. Patient is currently stable, does not feel depressed. - Continue Zoloft 25 mg daily on discharge  Essential hypertension  - Blood pressure at goal   Hypokalemia / Hypomagnesemia - Secondary to GI losses.  - Supplemented   Symptomatic Anemia / Acute postoperative blood loss anemia  - Has received 2 units of PRBCs 11/27/14, 1 unit 12/01/14, 1 unit 12/05/14 and 1 unit 12/08/14. - Hemoglobin was 7.8 on 12/15/2014 and 12/16/2014. He has received additional 1 unit of PRBC 12/16/2014.  Hyponatremia - Secondary to GI losses - Improved with fluids   Neutropenia, drug induced  - Secondary to Vioxx  - CBC stable   Failure to thrive in adult / Moderate protein calorie malnutrition - In the context of prolonged illness as assessed by nutritionist  - Continue Megace and nutritional supplementation as per nutritionist recommendation  DVT prophylaxis - Heparin subQ given in hospital   Code Status: Full Code Family Communication: Family not at the bedside this am; updated wife [name] over the phone  [phone] and [phone]; updated [name] as well who will take him to [facility]    IV access:  PICC line placed 10/12/14  Procedures and diagnostic studies in past 72 hours:   Dg Chest Port 1 View 12/10/2014 Increased bilateral upper lobe opacities are noted concerning for edema or possibly pneumonia. Increased mild left pleural effusion is noted.   Medical Consultants:  Infectious Disease: Carlyle Basques, MD General Surgery: Alphonsa Overall, MD  Other Consultants:  Nutrition Physical therapy Social work [place]  IAnti-Infectives:   Total days of antibiotics 63 Day 11 daptomycin. Stopped dapto today 9/14. Day 7 meropenem. Stopped 12/15/2014.  Signed:  Leisa Lenz, MD  [facility] 12/18/2014, 4:40 PM  Pager #: [phone]  Time spent in minutes: more than 30 minutes   Discharge Exam: Filed Vitals:   12/17/14 0522  BP: 160/90  Pulse: 108  Temp: 97.8 F (36.6 C)  Resp: 18   Filed Vitals:   12/16/14 1754 12/16/14 1829 12/16/14 2106 12/17/14 0522  BP: 137/82 137/83 171/99 160/90  Pulse: 107 109 120 108  Temp: 98.4 F (36.9 C) 98.9 F (37.2 C) 98.4 F (36.9 C) 97.8 F (36.6 C)  TempSrc: Oral Oral Oral Oral  Resp: 18 20 20 18   Height:      Weight:      SpO2: 99% 99% 100% 97%    General: Pt is alert, follows commands appropriately, not in acute distress Cardiovascular: Regular rate and rhythm, S1/S2 + Respiratory: Clear to auscultation bilaterally, no wheezing, no crackles, no rhonchi Abdominal: Soft, non tender, non distended, bowel sounds +, no guarding; wound vac in  place  Extremities: no edema, no cyanosis, pulses palpable bilaterally DP and PT Neuro: Grossly nonfocal  Discharge Instructions  Discharge Instructions    Diet - low sodium heart healthy    Complete by:  As directed      Discharge instructions    Complete by:  As directed   1. Dr. Megan Salon of infectious disease recommends repeat CT scan in the next 2-3 weeks on discharge  to evaluate the drain and how long patient needs Flagyl and Levaquin. For now he recommends continuing Flagyl and Levaquin on discharge. Patient needs to follow-up with infectious disease, surgery. 2. Please look at the wound care section on dressing changes recommendations as recommended by wound care team. Please note dressing changes or Monday Wednesday and Friday. Drain needs to remain in place.     Discharge wound care:    Complete by:  As directed   Dressing procedure/placement/frequency:Cleanse abdominal wound with NS and pat dry. Apply white foam to wound bed, cover with black foam. Black foam only to smaller wound above umbilicus. Drape to intact skin between wounds for protection. Seal immediately achieved at 125 mmHg continuous therapy. Change Mon/Wed/Fri.     Increase activity slowly    Complete by:  As directed             Medication List    STOP taking these medications        linezolid 600 MG tablet  Commonly known as:  ZYVOX     LORazepam 1 MG tablet  Commonly known as:  ATIVAN  Replaced by:  LORazepam 2 MG/ML injection     Melatonin 5 MG Caps     pantoprazole 40 MG tablet  Commonly known as:  PROTONIX      TAKE these medications        acetaminophen 160 MG/5ML solution  Commonly known as:  TYLENOL  Take 20.3 mLs (650 mg total) by mouth every 4 (four) hours as needed for mild pain, headache or fever.     alum & mag hydroxide-simeth 200-200-20 MG/5ML suspension  Commonly known as:  MAALOX/MYLANTA  Take 30 mLs by mouth every 6 (six) hours as needed for indigestion or heartburn (or bloating).     baclofen 10 MG tablet  Commonly known as:  LIORESAL  Take 1 tablet (10 mg total) by mouth 3 (three) times daily as needed for muscle spasms.     clonazePAM 0.5 MG disintegrating tablet  Commonly known as:  KLONOPIN  Take 1 tablet (0.5 mg total) by mouth 2 (two) times daily as needed (anxiety).     famotidine 20 MG tablet  Commonly known as:  PEPCID  Take 1  tablet (20 mg total) by mouth 2 (two) times daily.     feeding supplement (ENSURE ENLIVE) Liqd  Take 237 mLs by mouth 2 (two) times daily between meals.     ferrous sulfate 325 (65 FE) MG tablet  TAKE 1 TABLET (325 MG TOTAL) BY MOUTH 2 (TWO) TIMES DAILY WITH A MEAL.     levofloxacin 500 MG tablet  Commonly known as:  LEVAQUIN  Take 1 tablet (500 mg total) by mouth daily.     LORazepam 2 MG/ML injection  Commonly known as:  ATIVAN  Inject 0.25 mLs (0.5 mg total) into the vein at bedtime as needed (sleep).     magic mouthwash Soln  Take 15 mLs by mouth 4 (four) times daily as needed for mouth pain (sore throat).     megestrol 400 MG/10ML  suspension  Commonly known as:  MEGACE  Take 10 mLs (400 mg total) by mouth daily.     metroNIDAZOLE 500 MG tablet  Commonly known as:  FLAGYL  Take 1 tablet (500 mg total) by mouth every 6 (six) hours.     multivitamin with minerals Tabs tablet  Take 1 tablet by mouth every morning.     ondansetron 8 MG disintegrating tablet  Commonly known as:  ZOFRAN ODT  Take 1 tablet (8 mg total) by mouth 3 (three) times daily before meals.     oxyCODONE 5 MG immediate release tablet  Commonly known as:  Oxy IR/ROXICODONE  Take 1-2 tablets (5-10 mg total) by mouth every 4 (four) hours as needed for moderate pain, severe pain or breakthrough pain.     sertraline 25 MG tablet  Commonly known as:  ZOLOFT  Take 1 tablet (25 mg total) by mouth daily.     traZODone 50 MG tablet  Commonly known as:  DESYREL  Take 1 tablet (50 mg total) by mouth at bedtime.       Follow-up Information    Follow up with Gennette Pac, MD. Schedule an appointment as soon as possible for a visit in 2 weeks.   Specialty:  Family Medicine   Why:  Follow up appt after recent hospitalization   Contact information:   [place] [address] [phone]       Follow up with [facility], MD In 3 weeks.   Specialty:  General Surgery   Contact  information:   [address] [place] [address] [phone]        The results of significant diagnostics from this hospitalization (including imaging, microbiology, ancillary and laboratory) are listed below for reference.    Significant Diagnostic Studies: Ct Chest W Contrast  11/23/2014   CLINICAL DATA:  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  CONTRAST:  168mL OMNIPAQUE IOHEXOL 300 MG/ML  SOLN  COMPARISON:  11/07/2014  FINDINGS: CT CHEST FINDINGS  Thoracic inlet: No neck base or axillary masses or adenopathy. Normal thyroid.  Mediastinum and hila: Heart normal in size and configuration. Mild prominence of the ascending aorta measuring 4 cm in diameter. 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 Right PICC tip projects in the lower superior vena cava. No mediastinal or hilar masses or pathologically enlarged lymph nodes.  Lungs and pleura: No lung masses or suspicious nodules. Small left pleural effusion associated with mild dependent left lower lobe atelectasis. Small focus of reticular nodular opacity in the posterior right lower lobe is likely additional atelectasis or scarring. There is no lung consolidation or edema. No pneumothorax.  CT ABDOMEN AND PELVIS FINDINGS  There is a fluid collection that extends from the posterior medial left superior retroperitoneum, adjacent to the spleen, to the lateral margin of the superior left psoas muscle. This measures 4.9 x 2.2 cm transversely, previously 5.6 x 2.6 cm. It  measures 7.6 cm an approximate superior inferior length, previously 9.2 cm. Fluid attenuation and multiple blastic and clips lie in the left upper quadrant between the posterior wall  of the stomach and upper abdominal aorta. There is thickening of this portion of the stomach wall. These changes partly surround the pancreatic tail and surround the left diaphragmatic crus. No left adrenal gland is visible. Left kidney is surgically absent.  Liver and spleen:  Unremarkable.  Gallbladder and biliary tree: Unremarkable.  Pancreas: There is some heterogeneous lower attenuation along the pancreatic tail adjacent to the left upper quadrant surgical changes and low-attenuation soft tissue. This is similar to the prior exam, likely reactive edema. No other pancreatic abnormality.  Right adrenal glands: Normal.  Right kidney, ureter, bladder: 15 mm midpole cyst. No other right renal masses, no stones and no hydronephrosis. Normal ureters. Unremarkable bladder.  Lymph nodes:  No pathologically enlarged lymph nodes.  Ascites:  Trace ascites in the pelvis.  Gastrointestinal: There is a partial small bowel obstruction. The transition point is in the left upper adjacent to the postsurgical changes and residual left retroperitoneal fluid collection. Small bowel proximal to the transition is dilated to a maximum of 5.3 cm. Small bowel distal to this is decompressed. The colon is partly decompressed. There are scattered colonic diverticula mostly along the sigmoid colon. No diverticulitis. Normal-sized appendix is visualized. No bowel wall thickening or inflammatory changes. No free air.  Abdominal wall: There is edema in the subcutaneous fat mostly along the flanks. There is midline abdominal incision. No hernias.  Vascular: Atherosclerotic changes are noted along the infrarenal abdominal aorta and branch vessels. No aneurysm.  MUSCULOSKELETAL FINDINGS  No osteoblastic or osteolytic lesions. Degenerative changes noted of the  visualized spine. Bones are demineralized.  IMPRESSION: 1. Residual left retroperitoneal and left upper quadrant collection associated with postsurgical changes. The defined residual collection, along the posterior medial left retroperitoneum abutting the spleen and left psoas muscle, is smaller, now measuring 4.9 x 2.3 x 7.6 cm, previously 5.6 x 2.6 x 9.2 cm. Left upper quadrant postsurgical and inflammatory changes include thickening of the posterior wall of the stomach, stable. There are numerous vascular clips in the left retroperitoneum and left posterior medial upper quadrant. 2. Fairly high-grade partial small bowel obstruction with the transition point in the posterior medial left upper quadrant adjacent to the residual collection and postsurgical changes. This is similar to prior study. 3. No convincing local residual retroperitoneal sarcoma. 4. No evidence of metastatic disease from the sarcoma.   Electronically Signed   By: Lajean Manes M.D.   On: 11/23/2014 14:36   Ct Abdomen Pelvis W Contrast  11/23/2014   CLINICAL DATA:  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  CONTRAST:  179mL OMNIPAQUE IOHEXOL 300 MG/ML  SOLN  COMPARISON:  11/07/2014  FINDINGS: CT CHEST FINDINGS  Thoracic inlet: No neck base or axillary masses or adenopathy. Normal thyroid.  Mediastinum and hila: Heart normal in size and configuration. Mild prominence of the ascending aorta measuring 4 cm in diameter. 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 Right PICC tip  projects in the lower superior vena cava. No mediastinal or hilar masses or pathologically enlarged lymph nodes.  Lungs and pleura: No lung masses or suspicious nodules. Small left pleural effusion  associated with mild dependent left lower lobe atelectasis. Small focus of reticular nodular opacity in the posterior right lower lobe is likely additional atelectasis or scarring. There is no lung consolidation or edema. No pneumothorax.  CT ABDOMEN AND PELVIS FINDINGS  There is a fluid collection that extends from the posterior medial left superior retroperitoneum, adjacent to the spleen, to the lateral margin of the superior left psoas muscle. This measures 4.9 x 2.2 cm transversely, previously 5.6 x 2.6 cm. It measures 7.6 cm an approximate superior inferior length, previously 9.2 cm. Fluid attenuation and multiple blastic and clips lie in the left upper quadrant between the posterior wall of the stomach and upper abdominal aorta. There is thickening of this portion of the stomach wall. These changes partly surround the pancreatic tail and surround the left diaphragmatic crus. No left adrenal gland is visible. Left kidney is surgically absent.  Liver and spleen:  Unremarkable.  Gallbladder and biliary tree: Unremarkable.  Pancreas: There is some heterogeneous lower attenuation along the pancreatic tail adjacent to the left upper quadrant surgical changes and low-attenuation soft tissue. This is similar to the prior exam, likely reactive edema. No other pancreatic abnormality.  Right adrenal glands: Normal.  Right kidney, ureter, bladder: 15 mm midpole cyst. No other right renal masses, no stones and no hydronephrosis. Normal ureters. Unremarkable bladder.  Lymph nodes:  No pathologically enlarged lymph nodes.  Ascites:  Trace ascites in the pelvis.  Gastrointestinal: There is a partial small bowel obstruction. The transition point is in the left upper adjacent to the postsurgical changes and residual left  retroperitoneal fluid collection. Small bowel proximal to the transition is dilated to a maximum of 5.3 cm. Small bowel distal to this is decompressed. The colon is partly decompressed. There are scattered colonic diverticula mostly along the sigmoid colon. No diverticulitis. Normal-sized appendix is visualized. No bowel wall thickening or inflammatory changes. No free air.  Abdominal wall: There is edema in the subcutaneous fat mostly along the flanks. There is midline abdominal incision. No hernias.  Vascular: Atherosclerotic changes are noted along the infrarenal abdominal aorta and branch vessels. No aneurysm.  MUSCULOSKELETAL FINDINGS  No osteoblastic or osteolytic lesions. Degenerative changes noted of the visualized spine. Bones are demineralized.  IMPRESSION: 1. Residual left retroperitoneal and left upper quadrant collection associated with postsurgical changes. The defined residual collection, along the posterior medial left retroperitoneum abutting the spleen and left psoas muscle, is smaller, now measuring 4.9 x 2.3 x 7.6 cm, previously 5.6 x 2.6 x 9.2 cm. Left upper quadrant postsurgical and inflammatory changes include thickening of the posterior wall of the stomach, stable. There are numerous vascular clips in the left retroperitoneum and left posterior medial upper quadrant. 2. Fairly high-grade partial small bowel obstruction with the transition point in the posterior medial left upper quadrant adjacent to the residual collection and postsurgical changes. This is similar to prior study. 3. No convincing local residual retroperitoneal sarcoma. 4. No evidence of metastatic disease from the sarcoma.   Electronically Signed   By: Lajean Manes M.D.   On: 11/23/2014 14:36   Ct Aspiration  11/28/2014   CLINICAL DATA:  Recurrent left retroperitoneal fluid collection after previous resection of sarcoma. The patient now presents with a small bowel obstruction. Aspiration of the fluid collection has been  requested prior to planned surgery to determine if there is any evidence of infection. This fluid collection was last aspirated on 11/10/2014 revealing vancomycin resistant Enterococcus and also previously contained a percutaneous drain  for several months.  EXAM: CT GUIDED CATHETER DRAINAGE OF LEFT RETROPERITONEAL FLUID COLLECTION  ANESTHESIA/SEDATION: 1.5  Mg IV Versed; 75 mcg IV Fentanyl  Total Moderate Sedation Time: 15 minutes  PROCEDURE: The procedure risks, benefits, and alternatives were explained to the patient. Questions regarding the procedure were encouraged and answered. The patient understands and consents to the procedure. A time-out was performed prior to the procedure.  The left flank region was prepped with Betadine in a sterile fashion, and a sterile drape was applied covering the operative field. A sterile gown and sterile gloves were used for the procedure. Local anesthesia was provided with 1% Lidocaine.  Under CT guidance, a 5 [place] Yueh centesis catheter was advanced into the left retroperitoneal space. Fluid aspiration was performed through the 5 French catheter. The catheter was then removed. Additional CT was performed.  COMPLICATIONS: None  FINDINGS: Recurrent fluid collection in the medial left retroperitoneal space again identified, identical in appearance to scanning just prior to the previous aspiration procedure on 08/12. Approximately 10 mL of pink colored, turbid fluid was able to be aspirated. Material was sent for culture analysis. Postprocedural CT shows decrease in size of the fluid collection.  IMPRESSION: CT-guided aspiration of recurrent medial left retroperitoneal fluid collection yielding 10 mL of pink colored, turbid fluid. Fluid was sent for culture analysis.   Electronically Signed   By: Aletta Edouard M.D.   On: 11/28/2014 08:21   Ir Venipuncture 9yrs/older By Md  12/01/2014   CLINICAL DATA:  Anemia, difficult access, poor peripheral veins, access for blood  transfusion  EXAM: ULTRASOUND GUIDANCE FOR VASCULAR ACCESS  PERIPHERAL IV START BY PHYSICIAN.  Date:  9/2/20169/05/2014 5:13 pm  Radiologist:  M. Daryll Brod, MD  Guidance:  Ultrasound  FLUOROSCOPY TIME:  None.  MEDICATIONS AND MEDICAL HISTORY: None.  ANESTHESIA/SEDATION: None.  CONTRAST:  None.  COMPLICATIONS: None immediate  PROCEDURE: Informed consent was obtained from the patient following explanation of the procedure, risks, benefits and alternatives. The patient understands, agrees and consents for the procedure. All questions were addressed. A time out was performed.  Maximal barrier sterile technique utilized including caps, mask, sterile gowns, sterile gloves, large sterile drape, hand hygiene, and ChloraPrep  Preliminary ultrasound performed. Left basilic vein demonstrated. Under sterile conditions and local anesthesia, a 16 gauge Angiocath was advanced into the left basilic vein under ultrasound. Position confirmed with ultrasound. Images obtained for documentation. Blood aspirated easily followed by saline flush. This was secured externally with a sterile dressing. Ready for use.  IMPRESSION: Successful ultrasound left upper extremity basilic vein peripheral IV start.   Electronically Signed   By: [name] M.D.   On: 12/01/2014 17:19   Ir US Guide Vasc Access Left  12/01/2014   CLINICAL DATA:  Anemia, difficult access, poor peripheral veins, access for blood transfusion  EXAM: ULTRASOUND GUIDANCE FOR VASCULAR ACCESS  PERIPHERAL IV START BY PHYSICIAN.  Date:  9/2/20169/05/2014 5:13 pm  Radiologist:  M. Daryll Brod, MD  Guidance:  Ultrasound  FLUOROSCOPY TIME:  None.  MEDICATIONS AND MEDICAL HISTORY: None.  ANESTHESIA/SEDATION: None.  CONTRAST:  None.  COMPLICATIONS: None immediate  PROCEDURE: Informed consent was obtained from the patient following explanation of the procedure, risks, benefits and alternatives. The patient understands, agrees and consents for the procedure. All questions were  addressed. A time out was performed.  Maximal barrier sterile technique utilized including caps, mask, sterile gowns, sterile gloves, large sterile drape, hand hygiene, and ChloraPrep  Preliminary ultrasound performed. Left basilic vein  demonstrated. Under sterile conditions and local anesthesia, a 16 gauge Angiocath was advanced into the left basilic vein under ultrasound. Position confirmed with ultrasound. Images obtained for documentation. Blood aspirated easily followed by saline flush. This was secured externally with a sterile dressing. Ready for use.  IMPRESSION: Successful ultrasound left upper extremity basilic vein peripheral IV start.   Electronically Signed   By: [name] M.D.   On: 12/01/2014 17:19   Dg Chest Port 1 View  12/10/2014   CLINICAL DATA:  Shortness of breath, tachycardia.  EXAM: PORTABLE CHEST - 1 VIEW  COMPARISON:  July 13, 2014.  FINDINGS: Stable cardiomediastinal silhouette. No pneumothorax is noted. Increased bilateral upper lobe opacities are noted concerning for pneumonia or possibly edema. Increased mild left pleural effusion is noted. Right-sided PICC line is noted with distal tip overlying expected position of the SVC. Bony thorax is intact.  IMPRESSION: Increased bilateral upper lobe opacities are noted concerning for edema or possibly pneumonia. Increased mild left pleural effusion is noted.   Electronically Signed   By: Marijo Conception, M.D.   On: 12/10/2014 16:53   Dg Abd 2 Views  11/27/2014   CLINICAL DATA:  Followup small bowel obstruction  EXAM: ABDOMEN - 2 VIEW  COMPARISON:  11/26/2014  FINDINGS: The nasogastric tube is identified with tip in the stomach. The side port is below the GE junction. Dilated loops of small bowel and air-fluid levels are again noted. These measure up to 6.7 cm. Enteric contrast material is identified within the colon up to the level of the rectum.  IMPRESSION: 1. NG tube is within the stomach. 2. Persistent partial small bowel obstruction  3. Enteric contrast material is identified up the level of the rectum.   Electronically Signed   By: Kerby Moors M.D.   On: 11/27/2014 09:35   Dg Abd 2 Views  11/26/2014   CLINICAL DATA:  Abdominal distention with nausea and vomiting. Surgery in April. Small bowel obstruction symptoms.  EXAM: ABDOMEN - 2 VIEW  COMPARISON:  11/24/2014  FINDINGS: Upright and supine views. Upright view demonstrates persistent small bowel air-fluid levels, increased. Small left pleural effusion. There may be a nasogastric tube looped in the esophagus, incompletely imaged. Surgical clips in the upper abdomen. A metallic object projecting over the a lower chest is presumably external to the patient.  Supine images demonstrate small bowel distension. Example loop measures 6.7 cm today, versus 6.4 cm on the prior. Contrast is identified within the colon. No pneumatosis.  IMPRESSION: Similar small bowel obstruction pattern, without free intraperitoneal air or other acute complication.  The nasogastric may be looped in the distal esophagus. Incompletely imaged. These results will be called to the ordering clinician or representative by the Radiologist Assistant, and communication documented in the PACS or zVision Dashboard.   Electronically Signed   By: Abigail Miyamoto M.D.   On: 11/26/2014 10:07   Dg Abd Acute W/chest  11/24/2014   CLINICAL DATA:  Bloating and nausea since yesterday. Vomiting today. History of bowel obstructions. History of retroperitoneal liposarcoma.  EXAM: DG ABDOMEN ACUTE W/ 1V CHEST  COMPARISON:  CT abdomen and pelvis 11/23/2014  FINDINGS: Right PICC catheter tip over the low SVC region. No pneumothorax. Linear fibrosis or atelectasis in the left lung base. No focal airspace disease or consolidation in the lungs. Normal heart size and pulmonary vascularity.  Surgical clips in the upper abdomen. Contrast material is demonstrated in the right colon. There are prominent gas-filled distended small bowel loops  in the  mid abdomen and multiple air-fluid levels are demonstrated on the upright view. Appearance is consistent with mechanical small bowel obstruction. No free intra-abdominal air. No radiopaque stones. Degenerative changes in the spine.  IMPRESSION: No evidence of active pulmonary disease. Fibrosis or atelectasis in the left lung base.  Gaseous distention of mid abdominal small bowel with multiple air-fluid levels consistent with mechanical small bowel obstruction.   Electronically Signed   By: Lucienne Capers M.D.   On: 11/24/2014 21:51   Dg Abd Portable 1v  11/28/2014   CLINICAL DATA:  Evaluate NG tube repositioning.  EXAM: PORTABLE ABDOMEN - 1 VIEW  COMPARISON:  11/27/2014  FINDINGS: The nasogastric tube has been repositioned. The tip is in the fundus of the stomach in the side port is below the GE junction. Surgical clips are noted in the left upper quadrant of the abdomen. Again noted are dilated loops of bowel measuring up to 5 cm.  IMPRESSION: 1. Nasogastric tube is in the stomach. 2. Persistent small bowel dilatation consistent with obstruction.   Electronically Signed   By: Kerby Moors M.D.   On: 11/28/2014 09:05   Dg Abd Portable 1v  11/26/2014   CLINICAL DATA:  Nasogastric tube placement  EXAM: PORTABLE ABDOMEN - 1 VIEW  COMPARISON:  Portable exam 1318 hours compared to 11/26/2014  FINDINGS: Tip of nasogastric tube projects over proximal stomach.  Numerous surgical clips in the perigastric region.  Multiple dilated loops of small bowel again identified compatible with small bowel obstruction.  Small amount of contrast within a decompressed colon.  No definite bowel wall thickening.  Lung bases clear.  IMPRESSION: Small bowel obstruction.  Tip of nasogastric tube projects over proximal stomach.   Electronically Signed   By: Lavonia Dana M.D.   On: 11/26/2014 16:49    Microbiology: Recent Results (from the past 240 hour(s))  C difficile quick scan w PCR reflex     Status: None   Collection Time:  12/16/14  3:29 PM  Result Value Ref Range Status   C Diff antigen NEGATIVE NEGATIVE Final   C Diff toxin NEGATIVE NEGATIVE Final   C Diff interpretation Negative for toxigenic C. difficile  Final     Labs: Basic Metabolic Panel:  Recent Labs Lab 12/14/14 0520 12/15/14 0620 12/16/14 0900  NA 135 134* 134*  K 5.0 4.8 4.6  CL 107 105 104  CO2 22 23 23   GLUCOSE 136* 117* 123*  BUN 30* 29* 31*  CREATININE 0.94 1.06 1.04  CALCIUM 8.6* 8.7* 9.0  MG 2.0  --   --   PHOS 4.2  --   --    Liver Function Tests:  Recent Labs Lab 12/14/14 0520  AST 16  ALT 9*  ALKPHOS 134*  BILITOT 0.4  PROT 5.9*  ALBUMIN 2.2*   No results for input(s): LIPASE, AMYLASE in the last 168 hours. No results for input(s): AMMONIA in the last 168 hours. CBC:  Recent Labs Lab 12/14/14 0520 12/15/14 0620 12/16/14 0900  WBC 10.4 11.6* 10.8*  HGB 8.0* 7.8* 7.8*  HCT 24.7* 24.3* 25.0*  MCV 84.6 84.7 85.0  PLT 704* 811* 807*   Cardiac Enzymes: No results for input(s): CKTOTAL, CKMB, CKMBINDEX, TROPONINI in the last 168 hours. BNP: BNP (last 3 results)  Recent Labs  12/10/14 1701  BNP 103.3*    ProBNP (last 3 results) No results for input(s): PROBNP in the last 8760 hours.  CBG:  Recent Labs Lab 12/14/14 1802 12/15/14 0543  12/15/14 1124 12/15/14 1726 12/16/14 0603  GLUCAP 125* 109* 131* 138* 114*

## 2014-12-19 NOTE — Telephone Encounter (Signed)
I spoke with Barnett Applebaum and gave her the information regarding referral.  She is the main contact for appointments for Mr. Lynne.    Laverle Patter , RN

## 2014-12-19 NOTE — Telephone Encounter (Signed)
I will try to arrange infectious disease follow-up in [place] with Dr. Jackey Loge or one of his partners.

## 2014-12-20 ENCOUNTER — Other Ambulatory Visit: Payer: Self-pay

## 2014-12-20 DIAGNOSIS — C480 Malignant neoplasm of retroperitoneum: Secondary | ICD-10-CM

## 2014-12-25 LAB — FUNGUS CULTURE W SMEAR
Fungal Smear: NONE SEEN
Special Requests: NORMAL

## 2015-10-06 IMAGING — CT CT CHEST W/ CM
2 of 5 series · 14 of 46 positions shown, 16 images · IV contrast (OMNIPAQUE)
Comparison: 11/07/2014

CLINICAL DATA: Malignant retroperitoneal tumor NJK.6 ([id number])

Retroperitoneal sarcoma dx;d [DATE] with left nephrectomy & XRT
completed [DATE], left pleural effusion, Prominent lt proximal
clavicular area x 1 week
EXAM:
CT CHEST, ABDOMEN, AND PELVIS WITH CONTRAST
TECHNIQUE: Multidetector CT imaging of the chest, abdomen and pelvis was
performed following the standard protocol during bolus
administration of intravenous contrast.
CONTRAST:  100mL OMNIPAQUE IOHEXOL 300 MG/ML  SOLN

[Series 2: cap with st · axial · 0.93mm/px · z∈[-437,+168]mm · 11 of 139 slices shown, 13 images]
[im 9/139  soft-tissue]
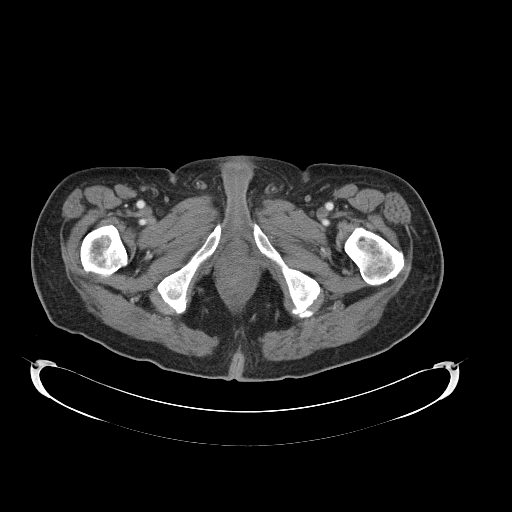
[im 9/139  bone]
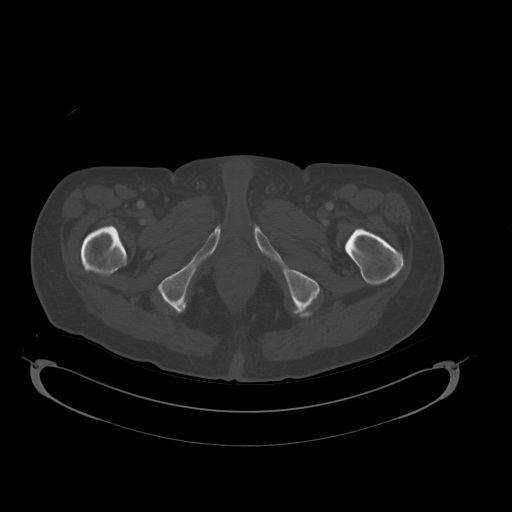
[im 25/139  soft-tissue]
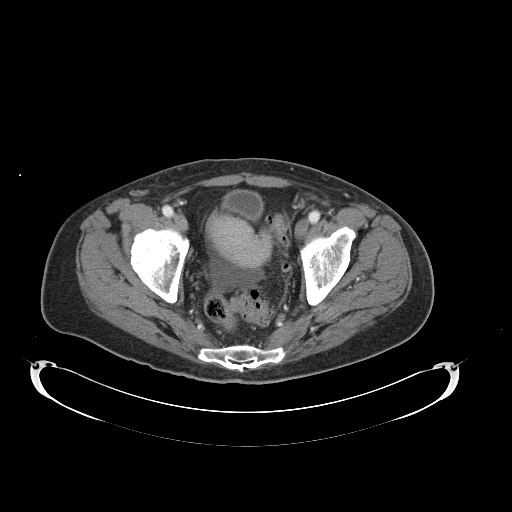
[im 33/139  soft-tissue]
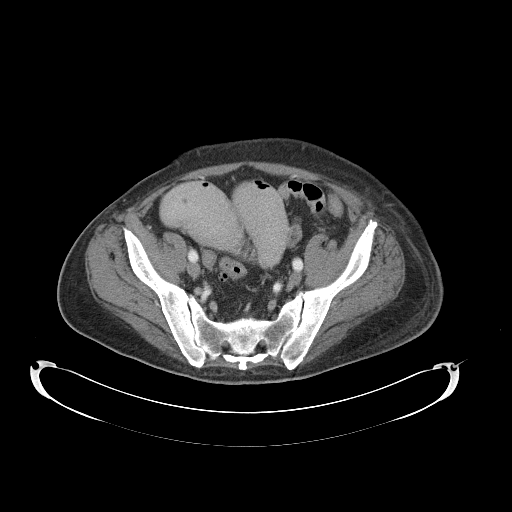
[im 49/139  soft-tissue]
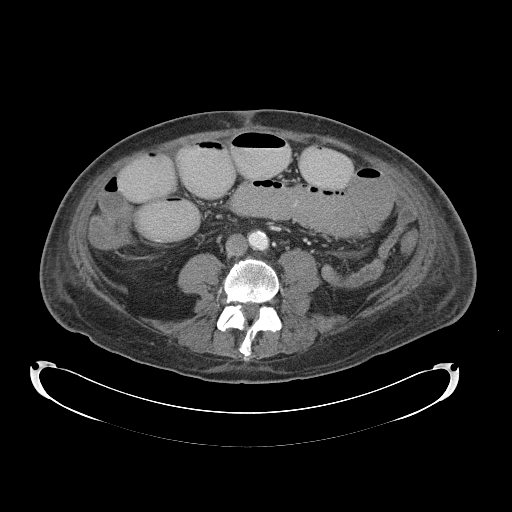
[im 57/139  soft-tissue]
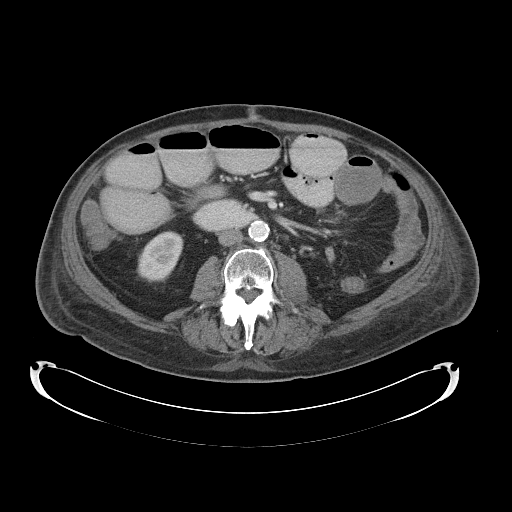
[im 74/139  soft-tissue]
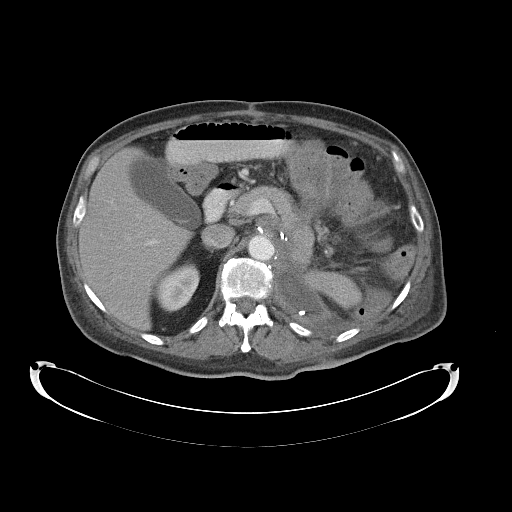
[im 82/139  soft-tissue]
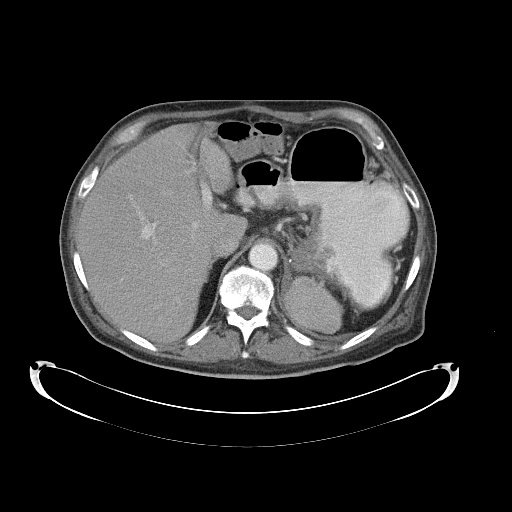
[im 90/139  soft-tissue]
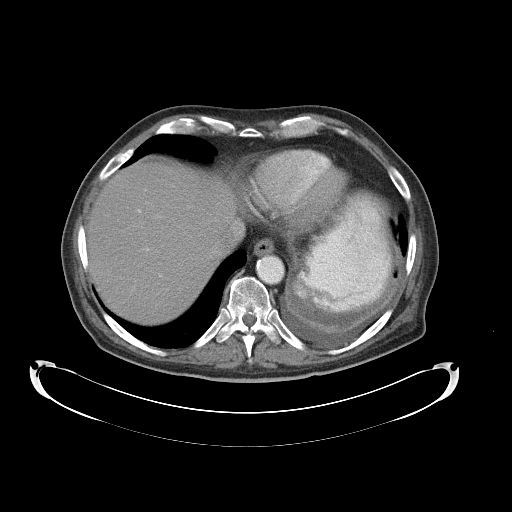
[im 106/139  soft-tissue]
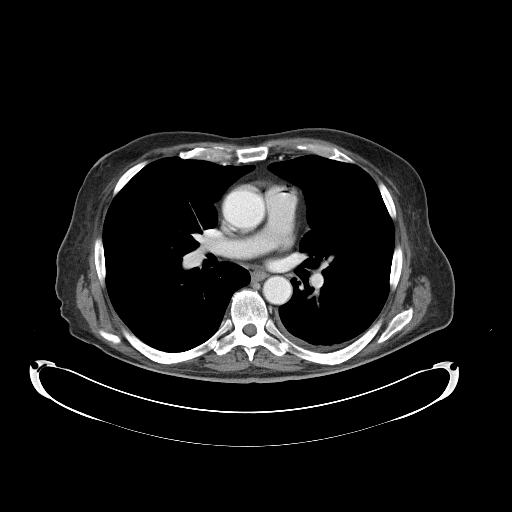
[im 106/139  bone]
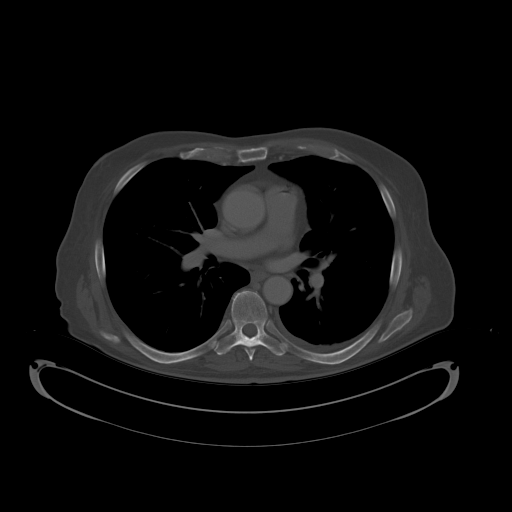
[im 114/139  soft-tissue]
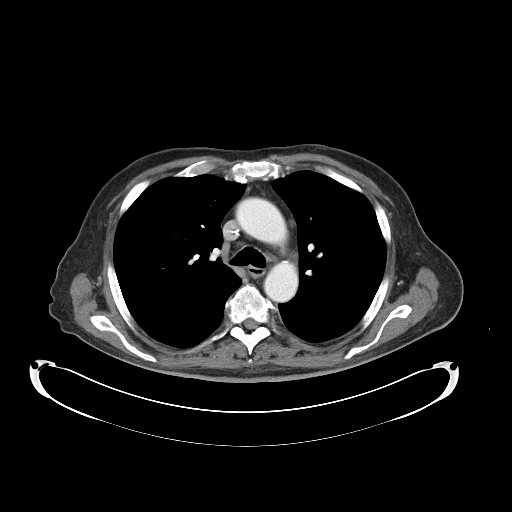
[im 130/139  soft-tissue]
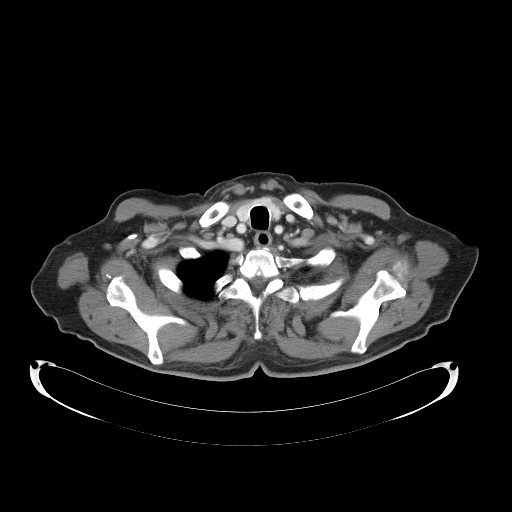

[Series 602: <mpr thick range> · coronal · 1.36mm/px · 3 of 87 slices shown]
[im 29/87  soft-tissue]
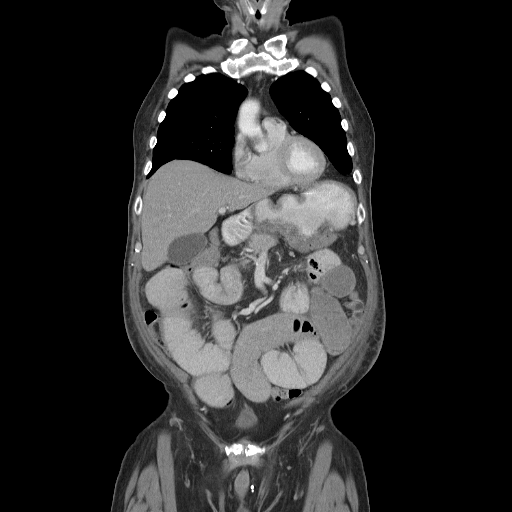
[im 39/87  soft-tissue]
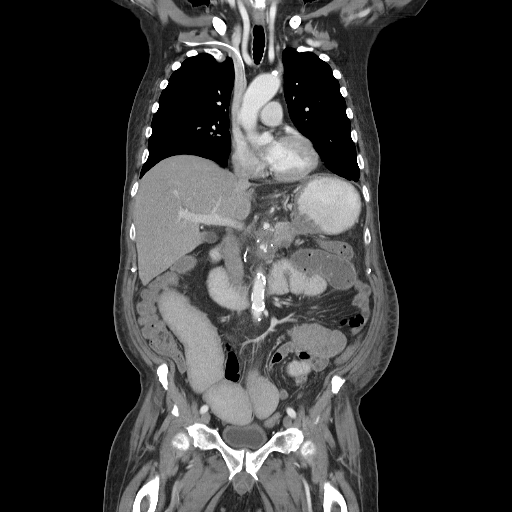
[im 48/87  soft-tissue]
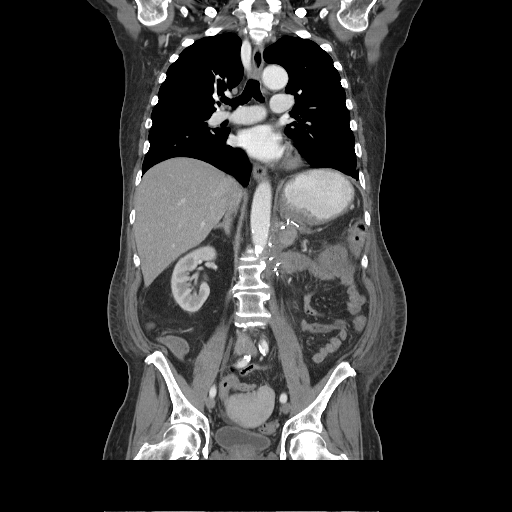

[14 of 46 positions shown; findings below may reference images not displayed]

FINDINGS: CT CHEST FINDINGS

Thoracic inlet: No neck base or axillary masses or adenopathy.
Normal thyroid.

Mediastinum and hila: Heart normal in size and configuration. Mild
prominence of the ascending aorta measuring 4 cm in diameter. There
is an aberrant right subclavian artery extending posterior to the
trachea and esophagus originating distal to the left subclavian.
Right PICC tip projects in the lower superior vena cava. No
mediastinal or hilar masses or pathologically enlarged lymph nodes.

Lungs and pleura: No lung masses or suspicious nodules. Small left
pleural effusion associated with mild dependent left lower lobe
atelectasis. Small focus of reticular nodular opacity in the
posterior right lower lobe is likely additional atelectasis or
scarring. There is no lung consolidation or edema. No pneumothorax.

CT ABDOMEN AND PELVIS FINDINGS

There is a fluid collection that extends from the posterior medial
left superior retroperitoneum, adjacent to the spleen, to the
lateral margin of the superior left psoas muscle. This measures
x 2.2 cm transversely, previously 5.6 x 2.6 cm. It measures 7.6 cm
an approximate superior inferior length, previously 9.2 cm. Fluid
attenuation and multiple blastic and clips lie in the left upper
quadrant between the posterior wall of the stomach and upper
abdominal aorta. There is thickening of this portion of the stomach
wall. These changes partly surround the pancreatic tail and surround
the left diaphragmatic crus. No left adrenal gland is visible. Left
kidney is surgically absent.

Liver and spleen:  Unremarkable.

Gallbladder and biliary tree: Unremarkable.

Pancreas: There is some heterogeneous lower attenuation along the
pancreatic tail adjacent to the left upper quadrant surgical changes
and low-attenuation soft tissue. This is similar to the prior exam,
likely reactive edema. No other pancreatic abnormality.

Right adrenal glands: Normal.

Right kidney, ureter, bladder: 15 mm midpole cyst. No other right
renal masses, no stones and no hydronephrosis. Normal ureters.
Unremarkable bladder.

Lymph nodes:  No pathologically enlarged lymph nodes.

Ascites:  Trace ascites in the pelvis.

Gastrointestinal: There is a partial small bowel obstruction. The
transition point is in the left upper adjacent to the postsurgical
changes and residual left retroperitoneal fluid collection. Small
bowel proximal to the transition is dilated to a maximum of 5.3 cm.
Small bowel distal to this is decompressed. The colon is partly
decompressed. There are scattered colonic diverticula mostly along
the sigmoid colon. No diverticulitis. Normal-sized appendix is
visualized. No bowel wall thickening or inflammatory changes. No
free air.

Abdominal wall: There is edema in the subcutaneous fat mostly along
the flanks. There is midline abdominal incision. No hernias.

Vascular: Atherosclerotic changes are noted along the infrarenal
abdominal aorta and branch vessels. No aneurysm.

MUSCULOSKELETAL FINDINGS

No osteoblastic or osteolytic lesions. Degenerative changes noted of
the visualized spine. Bones are demineralized.
IMPRESSION: 1. Residual left retroperitoneal and left upper quadrant collection
associated with postsurgical changes. The defined residual
collection, along the posterior medial left retroperitoneum abutting
the spleen and left psoas muscle, is smaller, now measuring 4.9 x
2.3 x 7.6 cm, previously 5.6 x 2.6 x 9.2 cm. Left upper quadrant
postsurgical and inflammatory changes include thickening of the
posterior wall of the stomach, stable. There are numerous vascular
clips in the left retroperitoneum and left posterior medial upper
quadrant.
2. Fairly high-grade partial small bowel obstruction with the
transition point in the posterior medial left upper quadrant
adjacent to the residual collection and postsurgical changes. This
is similar to prior study.
3. No convincing local residual retroperitoneal sarcoma.
4. No evidence of metastatic disease from the sarcoma.

## 2015-10-07 IMAGING — CR DG ABDOMEN ACUTE W/ 1V CHEST
4 series · 4 of 4 positions shown · non-contrast
Comparison: CT abdomen and pelvis 11/23/2014

CLINICAL DATA: Bloating and nausea since yesterday. Vomiting today.
History of bowel obstructions. History of retroperitoneal
liposarcoma.

EXAM:
DG ABDOMEN ACUTE W/ 1V CHEST

[w chest pa]
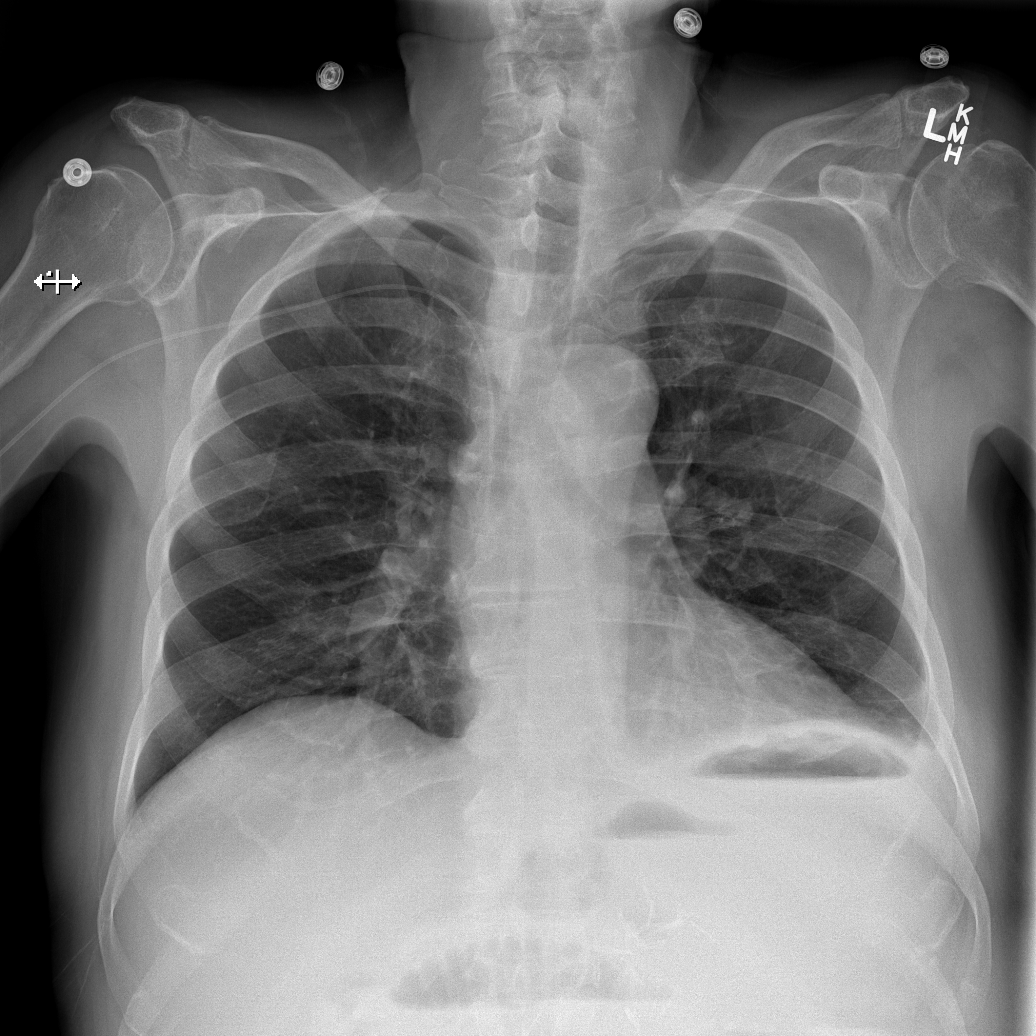

[w abdomen upright]
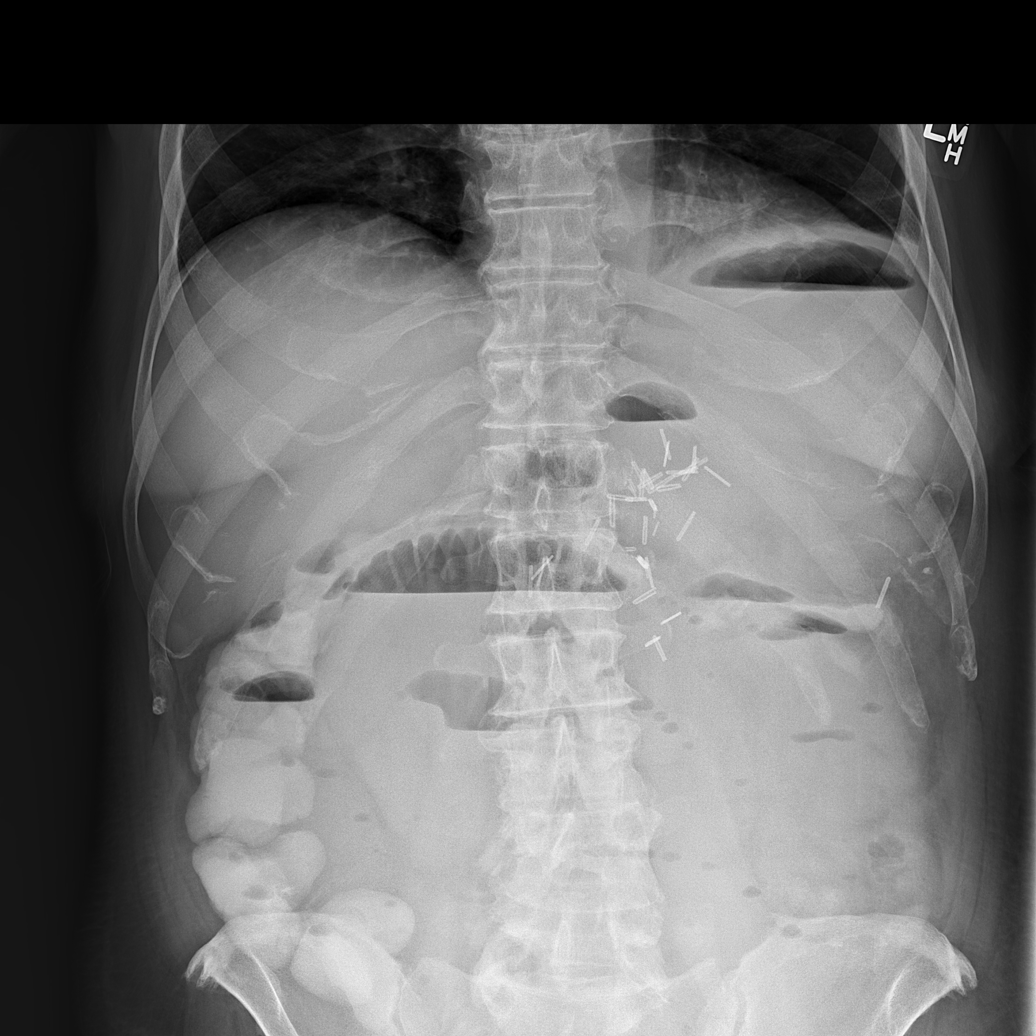

[t abdomen supine (1 of 2)]
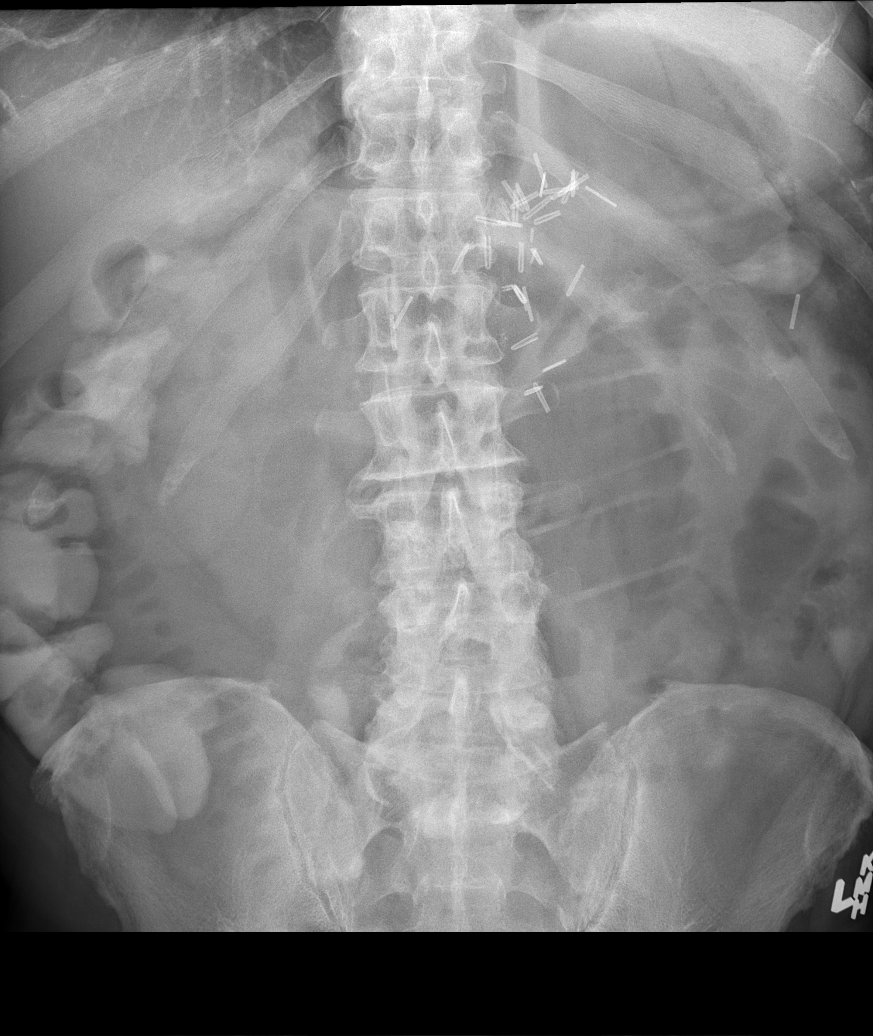

[t abdomen supine (2 of 2)]
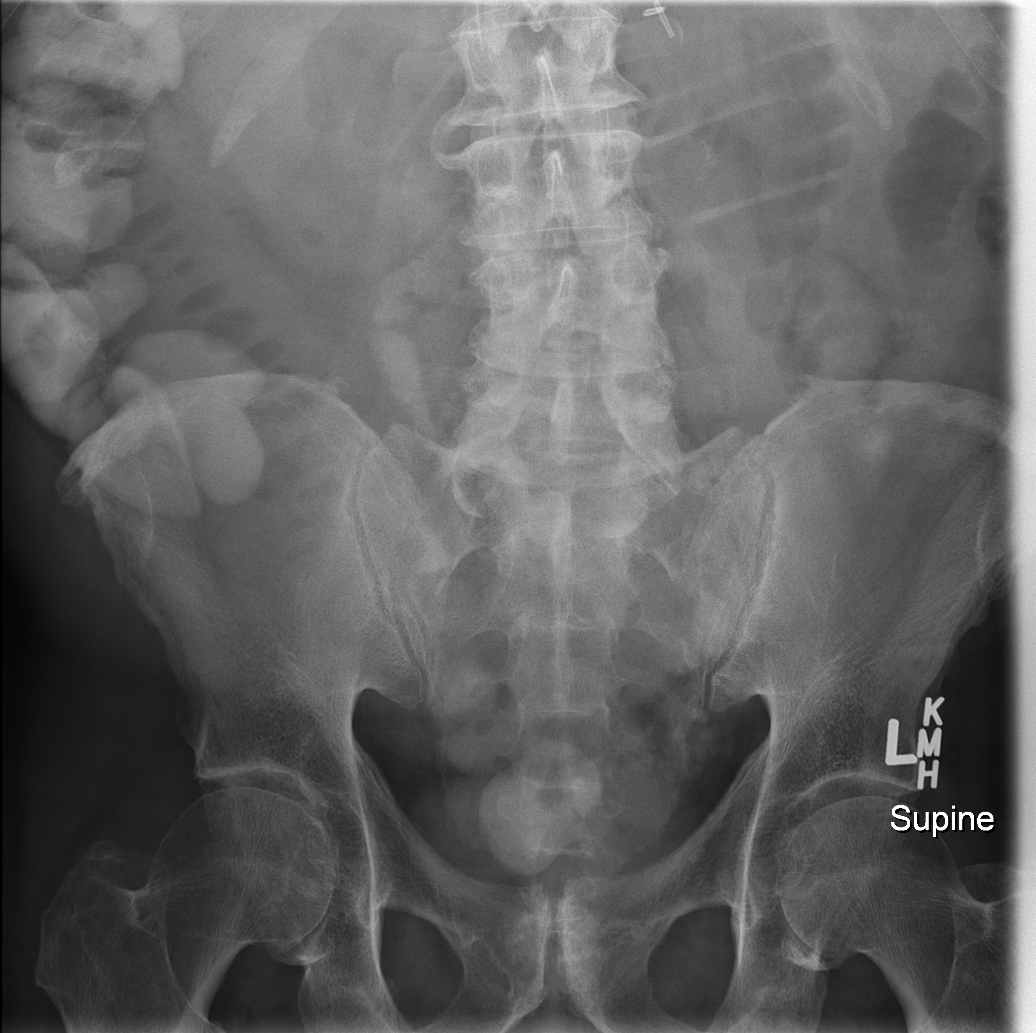

[4 of 4 positions shown; findings below may reference images not displayed]

FINDINGS: Right PICC catheter tip over the low SVC region. No pneumothorax.
Linear fibrosis or atelectasis in the left lung base. No focal
airspace disease or consolidation in the lungs. Normal heart size
and pulmonary vascularity.

Surgical clips in the upper abdomen. Contrast material is
demonstrated in the right colon. There are prominent gas-filled
distended small bowel loops in the mid abdomen and multiple
air-fluid levels are demonstrated on the upright view. Appearance is
consistent with mechanical small bowel obstruction. No free
intra-abdominal air. No radiopaque stones. Degenerative changes in
the spine.
IMPRESSION: No evidence of active pulmonary disease. Fibrosis or atelectasis in
the left lung base.

Gaseous distention of mid abdominal small bowel with multiple
air-fluid levels consistent with mechanical small bowel obstruction.

## 2017-08-07 ENCOUNTER — Encounter: Payer: Self-pay | Admitting: *Deleted

## 2017-08-07 NOTE — Progress Notes (Signed)
On 08-07-17 fax medical records to [facility], it was consult note, sim & planning note, end of tx note,follow up note, and dosimetry will  Do there part

## 2017-09-28 DEATH — deceased
